# Patient Record
Sex: Male | Born: 1940 | Race: White | Hispanic: No | Smoking: Former smoker
Health system: Southern US, Community
[De-identification: ages and names within clinical notes are randomized; demographics above are authoritative.]

## PROBLEM LIST (undated history)

## (undated) DIAGNOSIS — I714 Abdominal aortic aneurysm, without rupture, unspecified: Secondary | ICD-10-CM

## (undated) DIAGNOSIS — N200 Calculus of kidney: Secondary | ICD-10-CM

## (undated) DIAGNOSIS — I7121 Aneurysm of the ascending aorta, without rupture: Secondary | ICD-10-CM

## (undated) DIAGNOSIS — Z87442 Personal history of urinary calculi: Secondary | ICD-10-CM

## (undated) DIAGNOSIS — E039 Hypothyroidism, unspecified: Secondary | ICD-10-CM

## (undated) DIAGNOSIS — I499 Cardiac arrhythmia, unspecified: Secondary | ICD-10-CM

## (undated) DIAGNOSIS — K219 Gastro-esophageal reflux disease without esophagitis: Secondary | ICD-10-CM

## (undated) DIAGNOSIS — R0600 Dyspnea, unspecified: Secondary | ICD-10-CM

## (undated) DIAGNOSIS — I219 Acute myocardial infarction, unspecified: Secondary | ICD-10-CM

## (undated) DIAGNOSIS — C801 Malignant (primary) neoplasm, unspecified: Secondary | ICD-10-CM

## (undated) DIAGNOSIS — M1990 Unspecified osteoarthritis, unspecified site: Secondary | ICD-10-CM

## (undated) DIAGNOSIS — E119 Type 2 diabetes mellitus without complications: Secondary | ICD-10-CM

## (undated) DIAGNOSIS — E785 Hyperlipidemia, unspecified: Secondary | ICD-10-CM

## (undated) DIAGNOSIS — J302 Other seasonal allergic rhinitis: Secondary | ICD-10-CM

## (undated) DIAGNOSIS — E079 Disorder of thyroid, unspecified: Secondary | ICD-10-CM

## (undated) DIAGNOSIS — I10 Essential (primary) hypertension: Secondary | ICD-10-CM

## (undated) DIAGNOSIS — I4891 Unspecified atrial fibrillation: Secondary | ICD-10-CM

## (undated) DIAGNOSIS — U071 COVID-19: Secondary | ICD-10-CM

## (undated) DIAGNOSIS — IMO0001 Reserved for inherently not codable concepts without codable children: Secondary | ICD-10-CM

## (undated) DIAGNOSIS — T148XXA Other injury of unspecified body region, initial encounter: Secondary | ICD-10-CM

## (undated) DIAGNOSIS — I2510 Atherosclerotic heart disease of native coronary artery without angina pectoris: Secondary | ICD-10-CM

## (undated) HISTORY — DX: Essential (primary) hypertension: I10

## (undated) HISTORY — PX: KIDNEY STONE SURGERY: SHX686

## (undated) HISTORY — PX: SPINE SURGERY: SHX786

## (undated) HISTORY — DX: Gastro-esophageal reflux disease without esophagitis: K21.9

## (undated) HISTORY — DX: Calculus of kidney: N20.0

## (undated) HISTORY — DX: Disorder of thyroid, unspecified: E07.9

## (undated) HISTORY — DX: Malignant (primary) neoplasm, unspecified: C80.1

## (undated) HISTORY — DX: Hyperlipidemia, unspecified: E78.5

## (undated) HISTORY — DX: Abdominal aortic aneurysm, without rupture, unspecified: I71.40

## (undated) HISTORY — DX: Type 2 diabetes mellitus without complications: E11.9

## (undated) HISTORY — DX: Other injury of unspecified body region, initial encounter: T14.8XXA

## (undated) HISTORY — DX: Acute myocardial infarction, unspecified: I21.9

## (undated) HISTORY — PX: TONSILLECTOMY: SUR1361

## (undated) HISTORY — PX: CARDIAC CATHETERIZATION: SHX172

## (undated) HISTORY — DX: Reserved for inherently not codable concepts without codable children: IMO0001

## (undated) HISTORY — DX: Atherosclerotic heart disease of native coronary artery without angina pectoris: I25.10

## (undated) HISTORY — PX: OTHER SURGICAL HISTORY: SHX169

## (undated) HISTORY — DX: Abdominal aortic aneurysm, without rupture: I71.4

## (undated) HISTORY — DX: Unspecified atrial fibrillation: I48.91

## (undated) HISTORY — DX: Unspecified osteoarthritis, unspecified site: M19.90

## (undated) HISTORY — PX: CORONARY STENT PLACEMENT: SHX1402

## (undated) HISTORY — PX: PROSTATE SURGERY: SHX751

---
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
## 1979-11-14 HISTORY — PX: OTHER SURGICAL HISTORY: SHX169

## 1991-01-12 DIAGNOSIS — I219 Acute myocardial infarction, unspecified: Secondary | ICD-10-CM

## 1991-01-12 HISTORY — DX: Acute myocardial infarction, unspecified: I21.9

## 1998-06-28 ENCOUNTER — Ambulatory Visit (HOSPITAL_COMMUNITY): Admission: RE | Admit: 1998-06-28 | Discharge: 1998-06-28 | Payer: Self-pay | Admitting: Cardiology

## 1999-09-07 ENCOUNTER — Ambulatory Visit (HOSPITAL_COMMUNITY): Admission: RE | Admit: 1999-09-07 | Discharge: 1999-09-07 | Payer: Self-pay | Admitting: *Deleted

## 1999-09-27 ENCOUNTER — Ambulatory Visit (HOSPITAL_COMMUNITY): Admission: RE | Admit: 1999-09-27 | Discharge: 1999-09-27 | Payer: Self-pay | Admitting: *Deleted

## 1999-09-27 ENCOUNTER — Encounter: Payer: Self-pay | Admitting: *Deleted

## 2002-07-28 ENCOUNTER — Encounter (INDEPENDENT_AMBULATORY_CARE_PROVIDER_SITE_OTHER): Payer: Self-pay | Admitting: *Deleted

## 2002-07-28 ENCOUNTER — Ambulatory Visit (HOSPITAL_COMMUNITY): Admission: RE | Admit: 2002-07-28 | Discharge: 2002-07-28 | Payer: Self-pay | Admitting: *Deleted

## 2003-12-03 ENCOUNTER — Encounter: Admission: RE | Admit: 2003-12-03 | Discharge: 2003-12-03 | Payer: Self-pay | Admitting: Family Medicine

## 2003-12-22 ENCOUNTER — Encounter: Admission: RE | Admit: 2003-12-22 | Discharge: 2004-01-08 | Payer: Self-pay | Admitting: Neurosurgery

## 2004-11-13 HISTORY — PX: SPINE SURGERY: SHX786

## 2005-01-17 ENCOUNTER — Inpatient Hospital Stay (HOSPITAL_COMMUNITY): Admission: RE | Admit: 2005-01-17 | Discharge: 2005-01-20 | Payer: Self-pay | Admitting: Neurosurgery

## 2005-06-12 ENCOUNTER — Encounter: Admission: RE | Admit: 2005-06-12 | Discharge: 2005-06-21 | Payer: Self-pay | Admitting: Neurosurgery

## 2005-08-21 ENCOUNTER — Ambulatory Visit (HOSPITAL_COMMUNITY): Admission: RE | Admit: 2005-08-21 | Discharge: 2005-08-22 | Payer: Self-pay | Admitting: Cardiology

## 2006-03-09 ENCOUNTER — Emergency Department (HOSPITAL_COMMUNITY): Admission: EM | Admit: 2006-03-09 | Discharge: 2006-03-09 | Payer: Self-pay | Admitting: Emergency Medicine

## 2007-10-24 ENCOUNTER — Encounter: Admission: RE | Admit: 2007-10-24 | Discharge: 2007-10-24 | Payer: Self-pay | Admitting: Cardiology

## 2008-08-14 ENCOUNTER — Ambulatory Visit: Admission: RE | Admit: 2008-08-14 | Discharge: 2008-11-03 | Payer: Self-pay | Admitting: Radiation Oncology

## 2008-10-26 ENCOUNTER — Encounter: Admission: RE | Admit: 2008-10-26 | Discharge: 2008-10-26 | Payer: Self-pay | Admitting: Urology

## 2008-11-16 ENCOUNTER — Ambulatory Visit (HOSPITAL_BASED_OUTPATIENT_CLINIC_OR_DEPARTMENT_OTHER): Admission: RE | Admit: 2008-11-16 | Discharge: 2008-11-16 | Payer: Self-pay | Admitting: Urology

## 2008-12-03 ENCOUNTER — Ambulatory Visit: Admission: RE | Admit: 2008-12-03 | Discharge: 2009-01-14 | Payer: Self-pay | Admitting: Radiation Oncology

## 2009-04-13 HISTORY — PX: CORONARY ARTERY BYPASS GRAFT: SHX141

## 2009-04-26 ENCOUNTER — Encounter: Admission: RE | Admit: 2009-04-26 | Discharge: 2009-04-26 | Payer: Self-pay | Admitting: Cardiology

## 2009-04-26 ENCOUNTER — Inpatient Hospital Stay (HOSPITAL_COMMUNITY): Admission: EM | Admit: 2009-04-26 | Discharge: 2009-05-03 | Payer: Self-pay | Admitting: General Surgery

## 2009-04-26 ENCOUNTER — Ambulatory Visit: Payer: Self-pay | Admitting: Internal Medicine

## 2009-04-28 ENCOUNTER — Encounter (INDEPENDENT_AMBULATORY_CARE_PROVIDER_SITE_OTHER): Payer: Self-pay | Admitting: Cardiology

## 2009-04-28 ENCOUNTER — Ambulatory Visit: Payer: Self-pay | Admitting: Cardiothoracic Surgery

## 2009-04-28 ENCOUNTER — Encounter: Payer: Self-pay | Admitting: Cardiothoracic Surgery

## 2009-06-02 ENCOUNTER — Ambulatory Visit: Payer: Self-pay | Admitting: Cardiothoracic Surgery

## 2009-06-02 ENCOUNTER — Encounter: Admission: RE | Admit: 2009-06-02 | Discharge: 2009-06-02 | Payer: Self-pay | Admitting: Cardiothoracic Surgery

## 2010-01-03 ENCOUNTER — Encounter: Admission: RE | Admit: 2010-01-03 | Discharge: 2010-01-03 | Payer: Self-pay | Admitting: Cardiology

## 2010-12-29 ENCOUNTER — Other Ambulatory Visit: Payer: Self-pay | Admitting: Cardiology

## 2010-12-29 DIAGNOSIS — I714 Abdominal aortic aneurysm, without rupture: Secondary | ICD-10-CM

## 2011-01-03 ENCOUNTER — Ambulatory Visit
Admission: RE | Admit: 2011-01-03 | Discharge: 2011-01-03 | Disposition: A | Discharge status: Home / Self Care | Payer: Medicare Other | Source: Ambulatory Visit | Attending: Cardiology | Admitting: Cardiology

## 2011-01-03 ENCOUNTER — Other Ambulatory Visit: Payer: Self-pay

## 2011-01-03 DIAGNOSIS — I714 Abdominal aortic aneurysm, without rupture: Secondary | ICD-10-CM

## 2011-01-16 ENCOUNTER — Encounter (INDEPENDENT_AMBULATORY_CARE_PROVIDER_SITE_OTHER): Payer: Medicare Other | Admitting: Surgery

## 2011-01-16 DIAGNOSIS — I714 Abdominal aortic aneurysm, without rupture: Secondary | ICD-10-CM

## 2011-01-17 NOTE — Assessment & Plan Note (Signed)
OFFICE VISIT  Garrett Jordan, Garrett Jordan DOB:  12/28/1940                                       01/16/2011 [id number]  REASON FOR VISIT:  Abdominal aortic aneurysm.  HISTORY:  This is a very pleasant 70-year gentleman I am seeing at the request of Dr. Wynonia Lawman for evaluation of abdominal aortic aneurysm.  The patient initially had an abdominal ultrasound last year which showed a 4.2-cm aneurysm.  This was repeated and found to be 6.3 cm.  He was sent over for evaluation.  The patient denies having any abdominal pain or back pain.  The patient suffers from hypertension and hypercholesterolemia, both of which are medically managed.  He had a heart attack in 1992 and has had multiple stents in the past and recently underwent coronary bypass graft by Dr. Prescott Gum in 2010,  Patient has a history smoking but quit in 1971.  REVIEW OF SYSTEMS:  GI:  Positive for reflux.  GU:  Positive for frequent urination.  Musculoskeletal:  Positive for arthritis, joint pain, muscle pain.  All other review systems are negative as documented in the encounter form.  PAST MEDICAL HISTORY:  Hypertension, hypercholesterolemia, history of MI/coronary artery disease, prostate cancer.  PAST SURGICAL HISTORY:  Coronary artery bypass graft x5 in 2010, radioactive prostate implant January of 2010,  back surgery at L4-L5 March 2005,  coronary stents 1992 through 2006, kidney stone removal in 1981.  SOCIAL HISTORY:  He is single.  Does not smoke, quit in 1971.  Drinks 1 to 3 glasses of wine 3 to 4 times a week.  FAMILY HISTORY:  Positive for coronary disease in his father, who died age 88 of a heart attack.  ALLERGIES:  To ACE inhibitors, Lopressor  MEDICATIONS:  Please see medical record.  PHYSICAL EXAMINATION:  Heart rate is 73, blood pressure 169/94, O2 saturation is 98%.  General:  He is well-appearing, in no distress. HEENT:  Within normal limits.  Lungs:  Clear  bilaterally. Cardiovascular:  Regular rate and rhythm.  No carotid bruits, palpable pedal pulses.  Abdomen:  Soft, nontender.  No pulsatile mass appreciated.  Musculoskeletal:  No major deformities.  Neuro:  No focal deficits.  Skin:  Without rash.  DIAGNOSTIC STUDIES:  I have reviewed the patient's ultrasound report, which shows a 6-cm aneurysm.  ASSESSMENT:  Large abdominal aortic aneurysm.  PLAN:  I question the accuracy of the most recent study since it shows a 2-cm growth in his aneurysm in sight of a year.  Regardless, I think this needs to further evaluated.  The best way to do this would be with a CT scan.  I have ordered a CT angiogram of the chest, abdomen and pelvis to be performed this coming Monday with plans for them to see me afterwards.  Today I discussed the pros and cons and the details of open versus endovascular surgery.  We will make that decision based on the details of his CAT scan.  The patient will follow up with me in 1 week.    Eldridge Abrahams, MD Electronically Signed  VWB/MEDQ  D:  01/16/2011  T:  01/17/2011  Job:  [id number]  cc:   Ezzard Standing, M.D.

## 2011-01-18 ENCOUNTER — Other Ambulatory Visit: Payer: Self-pay | Admitting: Surgery

## 2011-01-18 DIAGNOSIS — I714 Abdominal aortic aneurysm, without rupture: Secondary | ICD-10-CM

## 2011-01-19 ENCOUNTER — Other Ambulatory Visit: Payer: Self-pay | Admitting: Orthopedic Surgery

## 2011-01-19 DIAGNOSIS — M545 Low back pain: Secondary | ICD-10-CM

## 2011-01-20 ENCOUNTER — Ambulatory Visit
Admission: RE | Admit: 2011-01-20 | Discharge: 2011-01-20 | Disposition: A | Discharge status: Home / Self Care | Payer: Medicare Other | Source: Ambulatory Visit | Attending: Orthopedic Surgery | Admitting: Orthopedic Surgery

## 2011-01-20 DIAGNOSIS — M545 Low back pain: Secondary | ICD-10-CM

## 2011-01-20 MED ORDER — GADOBENATE DIMEGLUMINE 529 MG/ML IV SOLN
15.0000 mL | Freq: Once | INTRAVENOUS | Status: AC | PRN
  Administered 2011-01-20: 15 mL via INTRAVENOUS

## 2011-01-23 ENCOUNTER — Ambulatory Visit (INDEPENDENT_AMBULATORY_CARE_PROVIDER_SITE_OTHER): Payer: Medicare Other | Admitting: Surgery

## 2011-01-23 ENCOUNTER — Ambulatory Visit
Admission: RE | Admit: 2011-01-23 | Discharge: 2011-01-23 | Disposition: A | Discharge status: Home / Self Care | Payer: Medicare Other | Source: Ambulatory Visit | Attending: Surgery | Admitting: Surgery

## 2011-01-23 ENCOUNTER — Encounter: Payer: Medicare Other | Admitting: Surgery

## 2011-01-23 DIAGNOSIS — I714 Abdominal aortic aneurysm, without rupture, unspecified: Secondary | ICD-10-CM

## 2011-01-23 MED ORDER — IOHEXOL 350 MG/ML SOLN
125.0000 mL | Freq: Once | INTRAVENOUS | Status: AC | PRN
  Administered 2011-01-23: 125 mL via INTRAVENOUS

## 2011-01-24 NOTE — Assessment & Plan Note (Addendum)
OFFICE VISIT  Garrett Jordan, Garrett Jordan DOB:  [birth year]                                       01/23/2011 [id number]  The patient comes back in today for followup of his aneurysm.  Briefly, he has had 2 ultrasounds performed a year apart, one showed a 4.2 cm infrarenal aneurysm, the other most recently found a 6.3 cm aneurysm.  I was a little skeptical about the recent measurements on ultrasound and felt it needed to be further evaluated with CT scan.  He had a CT angiogram earlier today, comes in to discuss results.  By CT scan the aneurysm diameter is at its maximum extent is 4.6 cm.  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  I discussed with the patient I would still recommend waiting until his aneurysm grows to larger than 5 cm before we would proceed with repair. I will plan on seeing him back in 6 months with repeat CT scan.  I spent approximately 45 minutes discussing the results, going over his images with the patient today.    Eldridge Abrahams, MD Electronically Signed  VWB/MEDQ  D:  01/23/2011  T:  01/24/2011  Job:  [id number]  cc:   Ezzard Standing, M.D.

## 2011-02-20 LAB — CBC
HCT: 27.4 % — ABNORMAL LOW (ref 39.0–52.0)
HCT: 29.6 % — ABNORMAL LOW (ref 39.0–52.0)
HCT: 29.8 % — ABNORMAL LOW (ref 39.0–52.0)
HCT: 30.4 % — ABNORMAL LOW (ref 39.0–52.0)
HCT: 32 % — ABNORMAL LOW (ref 39.0–52.0)
HCT: 38.4 % — ABNORMAL LOW (ref 39.0–52.0)
HCT: 40.3 % (ref 39.0–52.0)
HCT: 43.3 % (ref 39.0–52.0)
Hemoglobin: 10.4 g/dL — ABNORMAL LOW (ref 13.0–17.0)
Hemoglobin: 10.4 g/dL — ABNORMAL LOW (ref 13.0–17.0)
Hemoglobin: 10.6 g/dL — ABNORMAL LOW (ref 13.0–17.0)
Hemoglobin: 11 g/dL — ABNORMAL LOW (ref 13.0–17.0)
Hemoglobin: 14.3 g/dL (ref 13.0–17.0)
Hemoglobin: 9.6 g/dL — ABNORMAL LOW (ref 13.0–17.0)
MCHC: 34.3 g/dL (ref 30.0–36.0)
MCHC: 34.5 g/dL (ref 30.0–36.0)
MCHC: 34.7 g/dL (ref 30.0–36.0)
MCHC: 35 g/dL (ref 30.0–36.0)
MCHC: 35 g/dL (ref 30.0–36.0)
MCHC: 35.1 g/dL (ref 30.0–36.0)
MCHC: 35.4 g/dL (ref 30.0–36.0)
MCV: 91 fL (ref 78.0–100.0)
MCV: 91.6 fL (ref 78.0–100.0)
MCV: 91.7 fL (ref 78.0–100.0)
MCV: 91.9 fL (ref 78.0–100.0)
MCV: 92.2 fL (ref 78.0–100.0)
MCV: 92.3 fL (ref 78.0–100.0)
MCV: 92.4 fL (ref 78.0–100.0)
MCV: 90.2 fL (ref 78.0–100.0)
Platelets: 100 10*3/uL — ABNORMAL LOW (ref 150–400)
Platelets: 101 10*3/uL — ABNORMAL LOW (ref 150–400)
Platelets: 113 10*3/uL — ABNORMAL LOW (ref 150–400)
Platelets: 116 10*3/uL — ABNORMAL LOW (ref 150–400)
Platelets: 120 10*3/uL — ABNORMAL LOW (ref 150–400)
Platelets: 141 10*3/uL — ABNORMAL LOW (ref 150–400)
Platelets: 156 10*3/uL (ref 150–400)
Platelets: 156 10*3/uL (ref 150–400)
RBC: 2.98 MIL/uL — ABNORMAL LOW (ref 4.22–5.81)
RBC: 3.23 MIL/uL — ABNORMAL LOW (ref 4.22–5.81)
RBC: 3.23 MIL/uL — ABNORMAL LOW (ref 4.22–5.81)
RBC: 3.31 MIL/uL — ABNORMAL LOW (ref 4.22–5.81)
RBC: 3.52 MIL/uL — ABNORMAL LOW (ref 4.22–5.81)
RBC: 4.16 MIL/uL — ABNORMAL LOW (ref 4.22–5.81)
RBC: 4.36 MIL/uL (ref 4.22–5.81)
RBC: 4.8 MIL/uL (ref 4.22–5.81)
RDW: 13 % (ref 11.5–15.5)
RDW: 13.1 % (ref 11.5–15.5)
RDW: 13.4 % (ref 11.5–15.5)
RDW: 13.6 % (ref 11.5–15.5)
RDW: 13.6 % (ref 11.5–15.5)
RDW: 14 % (ref 11.5–15.5)
RDW: 14.2 % (ref 11.5–15.5)
WBC: 4.7 10*3/uL (ref 4.0–10.5)
WBC: 4.7 10*3/uL (ref 4.0–10.5)
WBC: 5.4 10*3/uL (ref 4.0–10.5)
WBC: 6 10*3/uL (ref 4.0–10.5)
WBC: 6.2 10*3/uL (ref 4.0–10.5)
WBC: 7.1 10*3/uL (ref 4.0–10.5)
WBC: 3.4 10*3/uL — ABNORMAL LOW (ref 4.0–10.5)

## 2011-02-20 LAB — COMPREHENSIVE METABOLIC PANEL
ALT: 39 U/L (ref 0–53)
AST: 30 U/L (ref 0–37)
Albumin: 4.2 g/dL (ref 3.5–5.2)
CO2: 25 mEq/L (ref 19–32)
Calcium: 10.3 mg/dL (ref 8.4–10.5)
Chloride: 106 mEq/L (ref 96–112)
Creatinine, Ser: 0.98 mg/dL (ref 0.4–1.5)
GFR calc Af Amer: >60 mL/min (ref >60)
GFR calc non Af Amer: >60 mL/min (ref >60)
Sodium: 139 mEq/L (ref 135–145)

## 2011-02-20 LAB — BLOOD GAS, ARTERIAL
Acid-base deficit: 0 mmol/L (ref 0.0–2.0)
Bicarbonate: 23.6 mEq/L (ref 20.0–24.0)
FIO2: 0.21 %
O2 Saturation: 94.6 %
Patient temperature: 98.6
TCO2: 24.7 mmol/L (ref 0–100)
pCO2 arterial: 35.1 mmHg (ref 35.0–45.0)
pH, Arterial: 7.442 (ref 7.350–7.450)
pO2, Arterial: 69.3 mmHg — ABNORMAL LOW (ref 80.0–100.0)

## 2011-02-20 LAB — POCT I-STAT 3, ART BLOOD GAS (G3+)
Acid-base deficit: 2 mmol/L (ref 0.0–2.0)
Acid-base deficit: 3 mmol/L — ABNORMAL HIGH (ref 0.0–2.0)
Acid-base deficit: 3 mmol/L — ABNORMAL HIGH (ref 0.0–2.0)
Acid-base deficit: 4 mmol/L — ABNORMAL HIGH (ref 0.0–2.0)
Acid-base deficit: 4 mmol/L — ABNORMAL HIGH (ref 0.0–2.0)
Bicarbonate: 21.2 mEq/L (ref 20.0–24.0)
Bicarbonate: 22.1 mEq/L (ref 20.0–24.0)
Bicarbonate: 24.2 mEq/L — ABNORMAL HIGH (ref 20.0–24.0)
O2 Saturation: 100 %
O2 Saturation: 90 %
O2 Saturation: 94 %
O2 Saturation: 94 %
O2 Saturation: 98 %
Patient temperature: 35.7
Patient temperature: 36.6
TCO2: 22 mmol/L (ref 0–100)
TCO2: 23 mmol/L (ref 0–100)
TCO2: 24 mmol/L (ref 0–100)
TCO2: 26 mmol/L (ref 0–100)
pCO2 arterial: 37 mmHg (ref 35.0–45.0)
pCO2 arterial: 40.1 mmHg (ref 35.0–45.0)
pCO2 arterial: 48.8 mmHg — ABNORMAL HIGH (ref 35.0–45.0)
pH, Arterial: 7.304 — ABNORMAL LOW (ref 7.350–7.450)
pH, Arterial: 7.362 (ref 7.350–7.450)
pO2, Arterial: 325 mmHg — ABNORMAL HIGH (ref 80.0–100.0)
pO2, Arterial: 69 mmHg — ABNORMAL LOW (ref 80.0–100.0)

## 2011-02-20 LAB — CROSSMATCH
ABO/RH(D): O POS
Antibody Screen: NEGATIVE

## 2011-02-20 LAB — URINALYSIS, ROUTINE W REFLEX MICROSCOPIC
Bilirubin Urine: NEGATIVE
Glucose, UA: NEGATIVE mg/dL
Hgb urine dipstick: NEGATIVE
Ketones, ur: NEGATIVE mg/dL
Nitrite: NEGATIVE
Protein, ur: NEGATIVE mg/dL
Specific Gravity, Urine: 1.01 (ref 1.005–1.030)
Urobilinogen, UA: 1 mg/dL (ref 0.0–1.0)
pH: 7 (ref 5.0–8.0)

## 2011-02-20 LAB — CARDIAC PANEL(CRET KIN+CKTOT+MB+TROPI)
CK, MB: 1.2 ng/mL (ref 0.3–4.0)
CK, MB: 1.6 ng/mL (ref 0.3–4.0)
CK, MB: 1.7 ng/mL (ref 0.3–4.0)
Relative Index: 1.3 (ref 0.0–2.5)
Relative Index: 1.5 (ref 0.0–2.5)
Total CK: 101 U/L (ref 7–232)
Total CK: 107 U/L (ref 7–232)
Total CK: 127 U/L (ref 7–232)
Troponin I: 0.05 ng/mL (ref 0.00–0.06)
Troponin I: 0.07 ng/mL — ABNORMAL HIGH (ref 0.00–0.06)

## 2011-02-20 LAB — BASIC METABOLIC PANEL
BUN: 10 mg/dL (ref 6–23)
BUN: 6 mg/dL (ref 6–23)
BUN: 8 mg/dL (ref 6–23)
BUN: 9 mg/dL (ref 6–23)
CO2: 25 mEq/L (ref 19–32)
CO2: 26 mEq/L (ref 19–32)
CO2: 27 mEq/L (ref 19–32)
CO2: 28 mEq/L (ref 19–32)
CO2: 28 mEq/L (ref 19–32)
Calcium: 8 mg/dL — ABNORMAL LOW (ref 8.4–10.5)
Calcium: 8.2 mg/dL — ABNORMAL LOW (ref 8.4–10.5)
Calcium: 8.5 mg/dL (ref 8.4–10.5)
Chloride: 102 mEq/L (ref 96–112)
Chloride: 110 mEq/L (ref 96–112)
Chloride: 104 mEq/L (ref 96–112)
Creatinine, Ser: 0.84 mg/dL (ref 0.4–1.5)
Creatinine, Ser: 0.91 mg/dL (ref 0.4–1.5)
Creatinine, Ser: 1.05 mg/dL (ref 0.4–1.5)
Creatinine, Ser: 1.05 mg/dL (ref 0.4–1.5)
GFR calc Af Amer: >60 mL/min (ref >60)
GFR calc Af Amer: >60 mL/min (ref >60)
GFR calc Af Amer: >60 mL/min (ref >60)
GFR calc non Af Amer: >60 mL/min (ref >60)
GFR calc non Af Amer: >60 mL/min (ref >60)
GFR calc non Af Amer: >60 mL/min (ref >60)
Glucose, Bld: 116 mg/dL — ABNORMAL HIGH (ref 70–99)
Glucose, Bld: 130 mg/dL — ABNORMAL HIGH (ref 70–99)
Glucose, Bld: 134 mg/dL — ABNORMAL HIGH (ref 70–99)
Glucose, Bld: 142 mg/dL — ABNORMAL HIGH (ref 70–99)
Potassium: 3.6 mEq/L (ref 3.5–5.1)
Potassium: 3.7 mEq/L (ref 3.5–5.1)
Potassium: 3.9 mEq/L (ref 3.5–5.1)
Potassium: 3.6 mEq/L (ref 3.5–5.1)
Sodium: 133 mEq/L — ABNORMAL LOW (ref 135–145)
Sodium: 134 mEq/L — ABNORMAL LOW (ref 135–145)
Sodium: 139 mEq/L (ref 135–145)
Sodium: 141 mEq/L (ref 135–145)

## 2011-02-20 LAB — DIFFERENTIAL
Eosinophils Absolute: 0.1 10*3/uL (ref 0.0–0.7)
Eosinophils Relative: 2 % (ref 0–5)
Lymphocytes Relative: 30 % (ref 12–46)
Lymphs Abs: 1 10*3/uL (ref 0.7–4.0)
Monocytes Absolute: 0.4 10*3/uL (ref 0.1–1.0)

## 2011-02-20 LAB — PREPARE PLATELETS

## 2011-02-20 LAB — HEMOGLOBIN A1C
Hgb A1c MFr Bld: 5.4 % (ref 4.6–6.1)
Mean Plasma Glucose: 108 mg/dL

## 2011-02-20 LAB — GLUCOSE, CAPILLARY
Glucose-Capillary: 100 mg/dL — ABNORMAL HIGH (ref 70–99)
Glucose-Capillary: 109 mg/dL — ABNORMAL HIGH (ref 70–99)
Glucose-Capillary: 117 mg/dL — ABNORMAL HIGH (ref 70–99)
Glucose-Capillary: 119 mg/dL — ABNORMAL HIGH (ref 70–99)
Glucose-Capillary: 119 mg/dL — ABNORMAL HIGH (ref 70–99)
Glucose-Capillary: 124 mg/dL — ABNORMAL HIGH (ref 70–99)
Glucose-Capillary: 132 mg/dL — ABNORMAL HIGH (ref 70–99)
Glucose-Capillary: 138 mg/dL — ABNORMAL HIGH (ref 70–99)
Glucose-Capillary: 94 mg/dL (ref 70–99)

## 2011-02-20 LAB — POCT I-STAT 4, (NA,K, GLUC, HGB,HCT)
Glucose, Bld: 100 mg/dL — ABNORMAL HIGH (ref 70–99)
Glucose, Bld: 107 mg/dL — ABNORMAL HIGH (ref 70–99)
Glucose, Bld: 126 mg/dL — ABNORMAL HIGH (ref 70–99)
Glucose, Bld: 129 mg/dL — ABNORMAL HIGH (ref 70–99)
Glucose, Bld: 131 mg/dL — ABNORMAL HIGH (ref 70–99)
HCT: 24 % — ABNORMAL LOW (ref 39.0–52.0)
HCT: 25 % — ABNORMAL LOW (ref 39.0–52.0)
HCT: 26 % — ABNORMAL LOW (ref 39.0–52.0)
HCT: 28 % — ABNORMAL LOW (ref 39.0–52.0)
HCT: 32 % — ABNORMAL LOW (ref 39.0–52.0)
HCT: 34 % — ABNORMAL LOW (ref 39.0–52.0)
Hemoglobin: 10.9 g/dL — ABNORMAL LOW (ref 13.0–17.0)
Hemoglobin: 11.6 g/dL — ABNORMAL LOW (ref 13.0–17.0)
Hemoglobin: 7.8 g/dL — CL (ref 13.0–17.0)
Hemoglobin: 8.5 g/dL — ABNORMAL LOW (ref 13.0–17.0)
Hemoglobin: 8.8 g/dL — ABNORMAL LOW (ref 13.0–17.0)
Hemoglobin: 9.5 g/dL — ABNORMAL LOW (ref 13.0–17.0)
Potassium: 3.3 mEq/L — ABNORMAL LOW (ref 3.5–5.1)
Potassium: 3.5 mEq/L (ref 3.5–5.1)
Potassium: 3.6 mEq/L (ref 3.5–5.1)
Potassium: 3.9 mEq/L (ref 3.5–5.1)
Potassium: 4 mEq/L (ref 3.5–5.1)
Potassium: 4.7 mEq/L (ref 3.5–5.1)
Sodium: 137 mEq/L (ref 135–145)
Sodium: 138 mEq/L (ref 135–145)
Sodium: 140 mEq/L (ref 135–145)
Sodium: 140 mEq/L (ref 135–145)
Sodium: 141 mEq/L (ref 135–145)

## 2011-02-20 LAB — CREATININE, SERUM
Creatinine, Ser: 0.75 mg/dL (ref 0.4–1.5)
Creatinine, Ser: 1.01 mg/dL (ref 0.4–1.5)
GFR calc Af Amer: >60 mL/min (ref >60)
GFR calc Af Amer: >60 mL/min (ref >60)
GFR calc non Af Amer: >60 mL/min (ref >60)
GFR calc non Af Amer: >60 mL/min (ref >60)

## 2011-02-20 LAB — POCT I-STAT, CHEM 8
BUN: 6 mg/dL (ref 6–23)
BUN: 7 mg/dL (ref 6–23)
Calcium, Ion: 1.17 mmol/L (ref 1.12–1.32)
Chloride: 101 mEq/L (ref 96–112)
Creatinine, Ser: 0.8 mg/dL (ref 0.4–1.5)
HCT: 30 % — ABNORMAL LOW (ref 39.0–52.0)
Sodium: 137 mEq/L (ref 135–145)
TCO2: 21 mmol/L (ref 0–100)
TCO2: 24 mmol/L (ref 0–100)

## 2011-02-20 LAB — HEMOGLOBIN AND HEMATOCRIT, BLOOD
HCT: 25.8 % — ABNORMAL LOW (ref 39.0–52.0)
Hemoglobin: 9.1 g/dL — ABNORMAL LOW (ref 13.0–17.0)

## 2011-02-20 LAB — PROTIME-INR
INR: 1.6 — ABNORMAL HIGH (ref 0.00–1.49)
Prothrombin Time: 19.6 seconds — ABNORMAL HIGH (ref 11.6–15.2)
Prothrombin Time: 14.2 seconds (ref 11.6–15.2)

## 2011-02-20 LAB — MAGNESIUM
Magnesium: 2.3 mg/dL (ref 1.5–2.5)
Magnesium: 2.3 mg/dL (ref 1.5–2.5)
Magnesium: 2.5 mg/dL (ref 1.5–2.5)

## 2011-02-20 LAB — LIPID PANEL
Cholesterol: 132 mg/dL (ref 0–200)
LDL Cholesterol: 63 mg/dL (ref 0–99)
Total CHOL/HDL Ratio: 4.4 RATIO

## 2011-02-20 LAB — PREPARE FRESH FROZEN PLASMA

## 2011-02-20 LAB — APTT
aPTT: 34 seconds (ref 24–37)
aPTT: 93 seconds — ABNORMAL HIGH (ref 24–37)

## 2011-02-20 LAB — HEPARIN LEVEL (UNFRACTIONATED): Heparin Unfractionated: 0.65 IU/mL (ref 0.30–0.70)

## 2011-02-20 LAB — PLATELET COUNT: Platelets: 92 10*3/uL — ABNORMAL LOW (ref 150–400)

## 2011-02-20 LAB — TSH: TSH: 2.471 u[IU]/mL (ref 0.350–4.500)

## 2011-02-20 LAB — CK TOTAL AND CKMB (NOT AT ARMC)
Relative Index: 1.5 (ref 0.0–2.5)
Total CK: 155 U/L (ref 7–232)

## 2011-02-20 LAB — ABO/RH: ABO/RH(D): O POS

## 2011-02-27 LAB — URINALYSIS, ROUTINE W REFLEX MICROSCOPIC
Bilirubin Urine: NEGATIVE
Glucose, UA: NEGATIVE mg/dL
Hgb urine dipstick: NEGATIVE
Protein, ur: NEGATIVE mg/dL

## 2011-03-28 NOTE — Assessment & Plan Note (Signed)
OFFICE VISIT   Garrett Jordan, Garrett Jordan  DOB:  Oct 11, 1941                                        June 02, 2009  CHART #:  [id number]   CURRENT PROBLEMS:  1. Status post coronary artery bypass graft x5 on April 29, 2009, for      unstable angina and severe three-vessel coronary artery disease.  2. Hypertension.  3. Recent history of prostate cancer treated with radioactive seeds.   PRESENT ILLNESS:  The patient returns for his first office visit after  recent multivessel bypass grafting.  He is a very nice 70 year old  Caucasian male, who previously underwent percutaneous stents in 2006 and  returned with recurrent pain.  Progression of disease was noted on his  recent cath by Dr. Wynonia Lawman with a patent stent, but distal RCA disease as  well as a high-grade stenosis of the LAD, circumflex, and a preserved EF  of 55%.  He underwent left IMA grafting to the LAD, vein grafts to the  OM-2, RV marginal, and posterior descending, and posterolateral branch  of the right coronary.  He has done well following surgery.  He was  discharged home in sinus rhythm on the fourth postoperative day.  His  current medications are being adjusted for his blood pressure; however,  he remains on amiodarone 200 mg a day, Toprol-XL 50 mg a day, doxazosin,  Niaspan, amitriptyline, omega fish oil, and Crestor 10 mg a day.  He has  had no recurrent chest pain.  His surgical incisions have healed well.  He is anxious to increase his activity level.   PHYSICAL EXAMINATION:  VITAL SIGNS:  Blood pressure 150/90, pulse 86,  respirations 18, and saturation 98%.  GENERAL:  He is alert and comfortable.  LUNGS:  Breath sounds are clear and equal.  CHEST:  The sternum is stable and well healed.  CARDIAC:  Rhythm is regular without murmur or gallop.  EXTREMITIES:  The leg incision is well healed.  There is no peripheral  edema.   PA and lateral chest x-ray reveals clear lung fields, stable cardiac  silhouette, and the sternal wires were all intact.   PLAN:  The patient is ready to begin his rehab program.  He is already  walking 3 miles a day at home and plans to continue home rehab.  He  knows he should not lift more than 20 pounds until 3 months after  surgery, but he can resume driving and normal daily activities at this  time.  The patient will continue his current medications and he does not  need a refill on this pain medication.  I would be happy to see the  patient back for any further surgical issues.  Otherwise, he appears to  be doing very well after surgery.   Ivin Poot, M.D.  Electronically Signed   PV/MEDQ  D:  06/02/2009  T:  06/03/2009  Job:  [id number]   cc:   Ezzard Standing, M.D.  Antony Contras, M.D.

## 2011-03-28 NOTE — Cardiovascular Report (Signed)
Garrett Jordan, WIXSON NO.:  [id number]   MEDICAL RECORD NO.:  [id number]          PATIENT TYPE:  INP   LOCATION:  4707                         FACILITY:  [facility]   PHYSICIAN:  Ezzard Standing, M.D.DATE OF BIRTH:  1941-09-16   DATE OF PROCEDURE:  04/27/2009  DATE OF DISCHARGE:                            CARDIAC CATHETERIZATION   HISTORY:  A 70 year old male with known coronary artery disease with  previous stenting of the right coronary artery in multiple locations.  He presented with progressive angina.   PROCEDURE:  Left heart catheterization with coronary angiograms and left  ventriculogram.   COMMENTS ABOUT PROCEDURE:  The procedure was done in the regular cath  lab.  He was prepped and draped in the usual manner.  After Xylocaine  anesthesia a 6-French sheath was placed in the right femoral artery  percutaneously.  Angiograms were made using 6-French catheters and a 3  mL ventriculogram was performed.  The films were reviewed and he was  taken off the table and returned to the holding area in stable  condition.   HEMODYNAMIC DATA:  Aorta post contrast 146/89, LV post contrast 146/7-  12.   ANGIOGRAPHIC DATA:  Left ventriculogram:  Performed in the 30 degrees  RAO projection, aortic valve is normal, the mitral valve is normal, left  ventricle appears normal in size, estimated ejection fraction is 55%.  Wall motion was normal.  Coronary arteries arise and distribute  normally.  Calcification is noted in the proximal coronary system.  The  left main coronary artery appears normal.  Left anterior descending is  calcified proximally.  There is a severe eccentric ulcerated stenosis  estimated at 90% after the first diagonal branch of the septal  perforator the distal vessel is large.  Circumflex coronary artery has a  large first marginal branch that contained scattered irregularities.  The continuation branch of the circumflex has a 70% stenosis prior to a  second and third marginal system.  Right coronary artery is patent  proximally.  The proximal and mid vessel stents were widely patent.  There is a severe 99% stenosis between a mid vessel and distal stent.  The distal stent is patent.  A posterolateral branch has a severe  stenosis at its ostium and the distal right coronary artery is occluded  and fills by collaterals in the left coronary system.   IMPRESSION:  Three-vessel coronary artery disease with progression of  stenosis involving the left anterior descending, progression of stenosis  involving the right coronary artery in a previously unstented area and  occlusion of the distal right coronary artery since previous study.   RECOMMENDATIONS:  Consideration of bypass grafting if the bypass  surgeons think that they can tie into the distal targets.  Could be  considered for percutaneous intervention of the LAD although the right  appears to have some unfavorable characteristics for intervention.      Ezzard Standing, M.D.  Electronically Signed     WST/MEDQ  D:  04/27/2009  T:  04/27/2009  Job:  [id number]   cc:   Antony Contras, M.D.

## 2011-03-28 NOTE — Discharge Summary (Signed)
Garrett Jordan, Garrett Jordan NO.:  [id number]   MEDICAL RECORD NO.:  [id number]          PATIENT TYPE:  INP   LOCATION:  2037                         FACILITY:  [facility]   PHYSICIAN:  Ivin Poot, M.D.  DATE OF BIRTH:  19-Jan-1941   DATE OF ADMISSION:  04/26/2009  DATE OF DISCHARGE:                               DISCHARGE SUMMARY   PRIMARY ADMITTING DIAGNOSIS:  Chest pain.   ADDITIONAL/DISCHARGE DIAGNOSES:  1. Severe 3-vessel coronary artery disease.  2. Unstable angina.  3. History of coronary artery disease status post prior percutaneous      transluminal coronary angioplasty and stenting.  4. Hypertension.  5. Dyslipidemia.  6. Prostate cancer status post radioactive seed implant.  7. Postoperative atrial fibrillation.  8. Postoperative acute blood loss anemia.   PROCEDURES PERFORMED:  1. Cardiac catheterization.  2. Coronary artery bypass grafting x5 (left internal mammary artery to      the left anterior descending, saphenous vein graft to the posterior      diagonal, saphenous vein graft to the posterolateral, saphenous      vein graft to the obtuse marginal, saphenous vein graft to the      right ventricular marginal).  3. Endoscopic vein harvest, right leg and left thigh.   HISTORY:  The patient is a 70 year old male with a known history of  coronary artery disease.  He had previously undergone a PTCA and  stenting to the distal right coronary artery in 2006 by Dr. Wynonia Lawman.  He  had done well until recently when he began to have recurrent symptoms.  He had exertional chest discomfort, which increased in intensity and  frequency over the several days prior to this admission.  He ultimately  presented to the emergency department on April 26, 2009, after chest  discomfort was unrelieved with rest.  He was admitted for further  evaluation and treatment.   HOSPITAL COURSE:  Mr. Hass was subsequently ruled out for myocardial  infarction.  He was seen by Dr.  Wynonia Lawman and it was felt that he should  undergo cardiac catheterization to further delineate his coronary  anatomy.  This was performed on April 27, 2009, and he was found to have  severe 3-vessel coronary artery disease which was not felt to be  amenable to percutaneous intervention.  A cardiac surgery consult was  obtained and Dr. Prescott Gum saw the patient and reviewed his films.  He  agreed that his best course of action would be to proceed with surgical  revascularization at this time.  He explained all risks, benefits, and  alternatives of surgery to the patient and Mr. Barcroft agreed to proceed.  He remained stable and pain free on a heparin drip prior to surgery.  He  was taken to the operating room on April 29, 2009, and underwent CABG x5  as described above.  Please see previously dictated operative report for  complete details of surgery.  He tolerated the procedure well and was  transferred to the SICU in stable condition.  He was able to be  extubated shortly after surgery.  He was hemodynamically stable and  doing well on postop day #1.  His tubes and lines were removed, and he  was kept in the unit for further observation.  By postop day #2, he was  ready for transfer to the floor.  He did have a brief episode of atrial  fibrillation and was started on amiodarone.  He did convert to normal  sinus rhythm, and his amiodarone dose has been switched to a p.o. dose.  He has also had low normal blood pressures running in the 123456  systolic range.  For this reason, Dr. Mare Ferrari decreased his dose of  Cardura by half; otherwise, he has remained stable.  He presently is  maintaining sinus rhythm with rates in the 90s.  His blood pressure  remained low normal 90-one teens .  He seems to be tolerating this well.  He is maintaining O2 sats greater than 90% on room air.  His incisions  are all healing well.  He is ambulating independently in the halls and  progressing well.  Postop day #3  labs show hemoglobin 8.9, hematocrit  26, platelets 100, white count 4.0.  Sodium 134, potassium 3.9, BUN 10,  creatinine 1.05.  Chest x-ray is stable with some mild atelectasis and  effusions.  He is currently being diuresed for mild postoperative volume  overload and his potassium is being repleted.  It is felt that if he  continues to remain stable over the next 24-48 hours, he will hopefully  be ready for discharge home possibly as early as May 03, 2009.   DISCHARGE MEDICATIONS:  1. Enteric-coated aspirin 325 mg daily.  2. Cardura 4 mg nightly.  3. Lopressor 25 mg b.i.d.  4. Amiodarone 400 mg b.i.d. x1 week, then 200 mg b.i.d.  5. Oxycodone 5 mg 1-2 q.4 h. p.r.n. for pain.  6. Synthroid 75 mcg daily.  7. Amitriptyline 25 mg daily.  8. Multivitamin daily.  9. Crestor 10 mg daily.  10.Omega-3 fish oil daily.  11.Claritin 10 mg daily.  12.Niaspan 1000 mg daily.   DISCHARGE INSTRUCTIONS:  He is asked to refrain from driving, heavy  lifting, or strenuous activity.  He may continue ambulating daily and  using his incentive spirometer.  He may shower daily and clean his  incisions with soap and water.  He will continue a low-fat, low-sodium  diet.   DISCHARGE FOLLOWUP:  He will need to make an appointment to see Dr.  Wynonia Lawman in 2 weeks.  He will then follow up with Dr. Prescott Gum in 3 weeks  and our office will contact him with an appointment.  In the interim if  he experiences any problems or has questions, he is asked to contact our  office immediately.      Suzzanne Cloud, P.A.      Ivin Poot, M.D.  Electronically Signed    GC/MEDQ  D:  05/02/2009  T:  05/02/2009  Job:  [id number]   cc:   Antony Contras, M.D.  Ezzard Standing, M.D.

## 2011-03-28 NOTE — Op Note (Signed)
NAMEBRAXTEN, Garrett Jordan NO.:  [id number]   MEDICAL RECORD NO.:  [id number]          PATIENT TYPE:  INP   LOCATION:  2316                         FACILITY:  [facility]   PHYSICIAN:  Ivin Poot, M.D.  DATE OF BIRTH:  Mar 18, 1941   DATE OF PROCEDURE:  04/29/2009  DATE OF DISCHARGE:                               OPERATIVE REPORT   OPERATIONS:  1. Coronary artery bypass grafting x5 (left internal mammary artery to      left anterior descending, saphenous vein graft to obtuse marginal-      2, saphenous vein graft to right ventricular marginal branch of      right coronary, saphenous vein graft to posterior descending, and      saphenous vein graft to distal posterolateral branch of right      coronary).  2. Endoscopic harvest of bilateral leg greater saphenous vein.   SURGEON:  Ivin Poot, MD   ASSISTANT:  John Giovanni, PA-C   ANESTHESIA:  General.   ANESTHESIOLOGIST:  Crissie Sickles. Conrad [place], MD   INDICATIONS:  The patient is a 70 year old Caucasian male with  hypertension and hyperlipidemia, who had a stent placed in the distal  RCA by Dr. Wynonia Lawman in 2006.  He did well until recently when he had  recurrent symptoms.  A coronary arteriogram was performed by Dr. Wynonia Lawman  which demonstrated severe progression of disease and distal RCA beyond  the stents was a high-grade 90-95% stenosis of the posterior descending  posterolateral.  There is also proximal high-grade stenosis of the  proximal RCA.  This was severe 90%.  The LAD had a 95% stenosis and the  OM-2 branch of the left coronary had a 80% stenosis.  It is felt due to  his multivessel disease that surgical revascularization would be the  best approach.  I discussed surgical revascularization with the patient  and his family including indications, benefits, alternatives, and risks.  He understood that we would be using the internal mammary artery and  endoscopically harvested saphenous vein.  We reviewed the risks  to him  of bleeding, blood transfusion requirement, stroke, MI, infection, and  death.  After reviewing these issues, he demonstrated his understanding  and agreed to proceed with the surgery under what I felt was an informed  consent.   OPERATIVE FINDINGS:  The LAD had severe diffuse disease and the  anastomosis was placed fairly distally.  The mammary artery was delicate  and thin, but had excellent flow and measured approximately 1.4 mm.  The  saphenous vein was harvested from both legs and was of good quality.  The distal right targets were small.  The distal circumflex target was  small.  These were very difficult to consider for future redo bypass  surgery.  Overall, LV function appeared to be well preserved.  The  patient received a unit of packed cells during surgery for a hemoglobin  of less than 7 grams.   PROCEDURE:  The patient was brought to the operative room, placed supine  on the operating table where general anesthesia was induced.  The  chest,  abdomen, and legs were prepped with Betadine and draped as a sterile  field.  A sternal incision was made as the saphenous vein was harvested  endoscopically from both legs.  The internal mammary artery was  harvested as a pedicle graft from its origin at the subclavian vessels  and was a good vessel with excellent flow.  The pericardium was opened  and suspended and after the vein had been harvested and found to be  adequate, the patient was heparinized and pursestrings were placed in  the ascending aorta and right atrium.  The patient was then cannulated  and placed on cardiopulmonary bypass.  The coronaries were identified  for grafting in the mammary artery and vein grafts were prepared for the  distal anastomoses.  Cardioplegia catheters were placed both antegrade  and retrograde cold blood cardioplegia and the patient was cooled to 32  degrees.  The cross-clamp was applied and 800 mL of cold blood  cardioplegia was  delivered in split doses between the antegrade aortic  and retrograde coronary sinus catheters.  There is good cardioplegic  arrest and septal temperature dropped less than 14 degrees.  Cardioplegia was then delivered every 20 minutes or less while the cross-  clamp was in place.   The distal coronary anastomoses were then performed.  The first distal  anastomosis was to the RV marginal branch of right coronary.  There was  a high-grade 90% stenosis proximally and a reverse saphenous vein was  sewn end-to-side with running 7-0 Prolene with good flow through the  graft.  The second distal anastomosis was to the distal posterior  descending branch of the right coronary.  This was a smaller 1.2-mm  vessel with a 99% proximal stenosis and a reverse saphenous vein was  sewn end-to-side with running 7-0 Prolene with good flow through the  graft.  The third distal anastomosis was to the OM-2 branch of the  circumflex.  This was a small 1.2-mm vessel with proximal 80% stenosis  and a reverse saphenous vein was sewn end-to-side with running 7-0  Prolene with good flow through the graft.  Cardioplegia was redosed.  The fourth distal anastomosis was to the distal posterolateral branch of  the right coronary just distal to the previously placed stent.  This was  a 1.2-1.4 mm vessel with proximal 95% stenosis and a reverse saphenous  vein was sewn end-to-side with running 7-0 Prolene with good flow  through the graft.  The fifth distal anastomosis was the distal third to  LAD.  The LAD was diffusely and severely diseased, and the anastomosis  was placed beyond the most distal significant stenosis.  The left IMA  pedicle was brought through an opening created in the left lateral  pericardium, was brought down on the LAD and sewn end-to-side with  running 8-0 Prolene.  There was good flow through the anastomosis after  briefly releasing the pedicle bulldog on the mammary artery.  The  bulldog was  reapplied and the pedicle was secured to epicardium with 6-0  Prolene sutures.  Cardioplegia was redosed.   While the cross-clamp was still in place, four proximal vein anastomoses  were placed on the ascending aorta using a 4.0-mm punch running 7-0  Prolene.  Prior to tying down the final proximal anastomosis, air was  vented from the coronaries with a dose of retrograde warm blood  cardioplegia.  The cross-clamp was then removed and the heart was  cardioverted back to regular rhythm.  The  cardioplegia catheters were  removed and air was aspirated from the vein grafts with 27-gauge needle.  The proximal and distal anastomoses were checked and found to be  hemostatic.  Each graft had good flow and temporary pacing wires were  applied.  The patient was rewarmed to 37 degrees.  The lungs were re-  expanded and the ventilator was resumed.  The patient was then weaned  from bypass without difficulty on renal dose, dopamine.  Cardiac output  and blood pressure remained stable and protamine was administered  without adverse reaction.  The cannulas were removed.  The platelet  count was 90,000 and the patient was administered a platelet transfusion  with improved coagulation function.  The mediastinum was irrigated with  warm antibiotic irrigation.  The superior pericardial fat was closed  over the aorta and vein grafts.  Two mediastinal and a left pleural  chest tube were placed and brought out through separate incisions.  The  sternum was closed with interrupted steel wire.  The pectoralis fascia  was closed in running #1 Vicryl.  The subcutaneous and skin layers were  closed in running Vicryl and sterile dressings were applied.  Total  bypass time being 155 minutes.      Ivin Poot, M.D.  Electronically Signed     PV/MEDQ  D:  04/29/2009  T:  04/30/2009  Job:  [id number]   cc:   Ezzard Standing, M.D.

## 2011-03-28 NOTE — H&P (Signed)
Garrett Jordan, GROSSO NO.:  [id number]   MEDICAL RECORD NO.:  [id number]          PATIENT TYPE:  INP   LOCATION:  4707                         FACILITY:  [facility]   PHYSICIAN:  Johnney Ou, MD DATE OF BIRTH:  1941/07/13   DATE OF ADMISSION:  04/26/2009  DATE OF DISCHARGE:                              HISTORY & PHYSICAL   PRIMARY CARDIOLOGIST:  Dr. Wynonia Lawman.   PRIMARY DOCTOR:  Dr. Moreen Fowler.   CHIEF COMPLAINT:  Chest pain.   HISTORY OF PRESENT ILLNESS:  This is a 70 year old white male with a  history of coronary artery disease, status post PCI to his right  coronary artery in 2006, who presents here with her recurrence of chest  pain.  He states that he saw Dr. Wynonia Lawman in his office today describing  recurrence of chest pain that he had years ago that required stenting.  He describes the pain as epigastric in nature, though it is associated  with neck fullness.  He was given antihistamines for possible GERD  exacerbation, although today while walking between in stores he  experienced low exertional recurrence of his symptoms.  He contacted Dr.  Wynonia Lawman and was referred to the emergency department for further  evaluation and management of his symptoms.   PAST MEDICAL HISTORY:  1. Prostate cancer.  2. Coronary artery disease, status post PCI to his RCA x3, latest one      in 2006.  3. Hypertension.  4. Hyperlipidemia.   MEDICATIONS ON ADMISSION:  1. Benicar 40 mg/25 mg daily.  2. Diclofenac 75 mg as needed.  3. Synthroid 75 mcg daily.  4. Toprol XL 100 mg daily.  5. Claritin as needed.  6. Omega-3 fatty acids.  7. Diltiazem 240 mg CR daily.  8. Crestor 10 mg nightly.  9. Doxazosin 8 mg daily.  10.Niaspan 1 gram every night.  11.Amitriptyline 25 mg at night.  12.Aspirin 81 mg daily.  13.Multivitamin.   SOCIAL HISTORY:  The patient lives in [place] with his wife.  He is  currently retired.  He does not smoke.   FAMILY HISTORY:  No known early  coronary artery disease.   REVIEW OF SYSTEMS:  All 14 systems were reviewed and were negative  except as mentioned in detail in the HPI.   PHYSICAL EXAMINATION:  VITAL SIGNS:  Blood pressure is 148/96,  respiratory rate 16 times per minute, pulses 72 and regular, temperature  is 97.2.  He is saturating 98% on room air.  GENERAL:  He is a 70 year old white male appearing stated age in no  acute distress.  HEENT:  Moist mucous membranes.  Pupils are equal, round and react to  light and accommodation.  Anicteric sclerae.  NECK:  No jugulovenous distention.  CARDIOVASCULAR:  Regular rate and rhythm.  No murmurs, rubs or gallops.  LUNGS:  Clear to auscultation bilaterally.  ABDOMEN:  Nontender, nondistended.  Positive bowel sounds.  No masses.  EXTREMITIES:  No clubbing, cyanosis, edema.  NEUROLOGIC:  Alert and oriented x3.  Cranial nerves II-XII grossly  intact.  No focal neurologic deficits.  PSYCH:  Mood and affect are appropriate.  SKIN:  Warm, dry and intact.  No rashes.   RADIOLOGY:  Chest x-ray showed hyperinflated lungs consistent with COPD,  otherwise no acute process.  EKG showed normal sinus rhythm with no ST  or T-wave abnormalities.  Laboratory values are pending.   ASSESSMENT/PLAN:  This is a 70 year old white male with history of  coronary artery disease and progressive exertional angina.   1. Crescendo angina.  We will continue his current medical regimen and      plan for cardiac catheterization in the morning.  He is currently      chest pain free and is not report any rest pain.  We will cycle      cardiac enzymes and continue close monitoring and consider full-      dose heparin drip if his pain occurs at rest.  2. Hyperlipidemia.  We will check a fasting lipid profile and continue      statin medication.  3. Hypothyroidism.  We will check a TSH and continue his Synthroid.      Johnney Ou, MD  Electronically Signed     BHH/MEDQ  D:  04/26/2009  T:   04/27/2009  Job:  [id number]

## 2011-03-28 NOTE — Op Note (Signed)
Garrett Jordan, Garrett Jordan                ACCOUNT NO.:  [id number]   MEDICAL RECORD NO.:  [id number]          PATIENT TYPE:  AMB   LOCATION:  NESC                         FACILITY:  [facility]   PHYSICIAN:  Lillette Boxer. Dahlstedt, M.D.DATE OF BIRTH:  Nov 21, 1940   DATE OF PROCEDURE:  11/16/2008  DATE OF DISCHARGE:                               OPERATIVE REPORT   SURGEON:  Lillette Boxer. Dahlstedt, M.D.   RADIATION ONCOLOGIST:  Sheral Apley. Tammi Klippel, M.D.   PREOPERATIVE DIAGNOSIS:  Adenocarcinoma the prostate, clinical stage  T1C, Gleason 3+3.   POSTOPERATIVE DIAGNOSIS:  Adenocarcinoma the prostate, clinical stage  T1C, Gleason 3+3.   PRINCIPAL PROCEDURE:  I-125 brachytherapy, cystoscopy.   ANESTHESIA:  General LMA.   COMPLICATIONS:  None.   BRIEF HISTORY:  A 70 year old male with clinical stage T1C  adenocarcinoma the prostate, with a Gleason score 3+3.  He was diagnosed  with a rising PSA.  His PSA on initial presentation earlier this year  was 5.5.  He had a 43 gram gland and biopsy revealed 8/12 biopsies with  Gleason 3+3 pattern.  He is undergoing a combination radiotherapy, and  has already completed external beam therapy by Dr. Ledon Snare.  He is  here today for brachytherapy.  Risks and complications of the procedure  have been discussed with the patient.  He understands these and desires  to proceed.   DESCRIPTION OF PROCEDURE:  The patient received preoperative IV  antibiotics and was identified in the holding area.  He was subsequently  taken to the operating room where general anesthetic was administered.  He was placed in the dorsal lithotomy position.  Genitalia and perineum  were prepped and draped after a Foley catheter was placed.  His scrotum  was retracted anteriorly and a rectal tube was placed.  Dr. Tammi Klippel  placed a transrectal ultrasound probe into his rectum, and scanning of  the prostate was performed.  Outlining of the prostate and rectum as  well as urethra was  performed by Dr. Tammi Klippel.  Two fixation needles were  placed.  Once the needle template was placed against the patient's  perineum.   Following this, a total of 76 seeds were placed using 18 needles.  For  definitive information, please see the treatment planning schedule.  Following placement of all seeds by the Nucletron device, the fixation  needles in the template were removed.  Scanning using the fluoroscopy  was performed for hard picture.  The Foley catheter was removed  and cystoscopy was performed revealing no urethral or bladder injury or  seeds.  At this point the scope was removed and the procedure  terminated.  The patient was awakened and taken to PACU in stable  condition.   The patient will return to the office in 3 weeks.  He was discharged on  hydrocodone and Cipro.      Lillette Boxer. Dahlstedt, M.D.  Electronically Signed     SMD/MEDQ  D:  11/16/2008  T:  11/16/2008  Job:  [id number]   cc:   Sheral Apley Tammi Klippel, M.D.  Fax: [phone]   Antony Contras, M.D.  Fax: [phone]

## 2011-03-28 NOTE — Consult Note (Signed)
NAMEALEIX, Garrett NO.:  [id number]   MEDICAL RECORD NO.:  [id number]          PATIENT TYPE:  INP   LOCATION:  4702                         FACILITY:  [facility]   PHYSICIAN:  Ivin Poot, M.D.  DATE OF BIRTH:  12/07/1940   DATE OF CONSULTATION:  04/28/2009  DATE OF DISCHARGE:                                 CONSULTATION   CARDIOTHORACIC SURGICAL CONSULTATION:   PRIMARY CARE PHYSICIAN:  Dr. Moreen Fowler   REASON FOR CONSULTATION:  Severe three-vessel coronary disease with  unstable angina.   HISTORY OF PRESENT ILLNESS:  I was asked to evaluate this 70 year old  Caucasian male for evaluation and treatment of recently-diagnosed severe  multivessel coronary artery disease.  The patient has a history of  coronary artery stenosis and underwent PCI with stents to the distal RCA  in 2006.  He has had recent recurrent exertional chest pain which is  becoming increased in intensity and frequency for the past several days.  It is epigastric in location and radiates to his neck.  This is usually  relieved by rest and he was admitted through the emergency department 2  days ago with negative enzymes.  He underwent cardiac catheterization  yesterday by Dr. Wynonia Lawman which demonstrated progression of his disease  since 2006 with now a 95% stenosis of the LAD, 70% stenosis of the  circumflex and 99% stenosis of distal RCA beyond the previously-placed  stent.  His EF is well-preserved at 55%.  Because of his symptoms and  coronary anatomy, a surgical evaluation was requested.   PAST MEDICAL HISTORY:  1. Hypertension.  2. Dyslipidemia.  3. Status post radiation of prostate cancer with radioactive seed      placement, January 2010 by Dr. Diona Fanti.   HOME MEDICATIONS:  1. Benicar 40/25 mg daily.  2. Synthroid 75 mcg daily.  3. Toprol XL 100 mg daily.  4. Omega 3 fatty acid.  5. Cardizem CD 240 mg daily.  6. Crestor 10 mg q.h.s.  7. Doxazosin 8 mg q.h.s.  8. Amitriptyline 25  mg q.h.s.  9. Aspirin 81 mg a day.  10.Niacin 1 g daily.  11.Multivitamin.  12.Diclofenac 75 mg p.r.n.   ALLERGIES:  None.   SOCIAL HISTORY:  He is retired, lives in [place] with his wife and  does not smoke.   FAMILY HISTORY:  No known early onset of coronary disease or heart  bypass surgery.   REVIEW OF SYSTEMS:  Constitutional review is negative fever, weight  loss.  ENT review is negative for dental complaint or difficulty  swallowing.  Thoracic review is negative for history of thoracic trauma  or abnormal chest x-ray.  Cardiac review is positive for his coronary  disease.  Negative for cardiac arrhythmia, murmur or heart valve  problems.  GI review is positive for some GERD.  Negative for hepatitis,  jaundice or blood per rectum.  Urologic is positive for some nocturia  and urgency and recent history of adenocarcinoma of the prostate,  treated with radioactive seed implantation in January of this year.  Endocrine review is negative for diabetes, positive for  thyroid disease.  Vascular review is negative DVT, claudication or TIA.  His pre-CABG  Doppler showed no significant carotid stenosis and brachial artery  pressures are equal bilaterally.  Hematologic review is negative for  bleeding disorder or blood transfusion.   PHYSICAL EXAMINATION:  VITAL SIGNS:  The patient is 5 feet, weighs 90  kg.  Blood pressure is 140/90, pulse 72 and regular.  He is afebrile.  GENERAL APPEARANCE:  That of a middle-aged Caucasian male in no acute  distress.  HEENT:  Normocephalic.  NECK:  Without JVD, mass or bruit.  LYMPHATICS:  Reveal no palpable supraclavicular or cervical adenopathy.  CARDIAC:  Rhythm is regular without S3 gallop or murmur.  ABDOMINAL EXAM:  Soft, nontender, without pulsatile mass.  LUNGS:  Clear to auscultation and there is no thoracic deformity.  EXTREMITIES:  Reveal no clubbing, cyanosis, edema.  Peripheral pulses  are present in all extremities.  NEUROLOGIC  EXAM:  Alert and oriented without focal motor deficit.  SKIN:  Without rash or lesion.   LABORATORY DATA:  I reviewed the coronary arteriograms with Dr. Wynonia Lawman  and agree with his interpretation of severe multivessel coronary  disease, high-grade stenosis of the proximal LAD, but significant  disease of the distal circumflex and right coronary circulations.  He  would benefit from multivessel bypass surgery.   PLAN:  I discussed the situation with the patient and described the  surgery, indications, benefits and risks and we will proceed with  scheduling him for surgery on June 17.  Thank you for the consultation.      Ivin Poot, M.D.  Electronically Signed     PV/MEDQ  D:  04/28/2009  T:  04/28/2009  Job:  [id number]   cc:   Antony Contras, M.D.

## 2011-03-29 ENCOUNTER — Other Ambulatory Visit: Payer: Self-pay | Admitting: Surgery

## 2011-03-29 DIAGNOSIS — I714 Abdominal aortic aneurysm, without rupture: Secondary | ICD-10-CM

## 2011-03-31 NOTE — Op Note (Signed)
NAMELALIT, THELEN NO.:  [id number]   MEDICAL RECORD NO.:  [id number]          PATIENT TYPE:  OIB   LOCATION:  2899                         FACILITY:  [facility]   PHYSICIAN:  Otilio Connors, M.D.  DATE OF BIRTH:  13-Jun-1941   DATE OF PROCEDURE:  01/17/2005  DATE OF DISCHARGE:                                 OPERATIVE REPORT   PREOPERATIVE DIAGNOSIS:  Herniated nucleus pulposus with stenosis, lumbar 4-  5, with left-sided radiculopathy.   POSTOPERATIVE DIAGNOSIS:  Herniated nucleus pulposus with stenosis, lumbar 4-  5, with left-sided radiculopathy.   OPERATION PERFORMED:  Left lumbar 4-5 semi-hemilaminectomy and diskectomy,  and microdissection with the microscope.   SURGEON:  Otilio Connors, M.D.   ASSISTANT:  Marchia Meiers. Vertell Limber, M.D.   ANESTHESIA:  General endotracheal tube anesthesia.   ESTIMATED BLOOD LOSS:  Three-hundred-fifty milliliters.   FLUIDS REPLACED:  Blood given; none.   DRAINS:  None.   COMPLICATIONS:  Dural rent occurred primarily and it was sealed with Tisseel  tissue glue.   DISPOSITION:  The patient was taken to the recovery room in stable condition  after the case.   REASON FOR SURGERY:  The patient is a 70 year old gentleman who has had  severe leg pain, numbness and weakness with weakness in the EHL and  dorsiflexion, and decreased sensation at the L5 root.  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  Flexion extension views of the level showed no abnormal motion.  The patient  was brought in for a diskectomy.   DESCRIPTION OF OPERATION:  The patient was brought to the operating room and  general anesthesia was induced.  The patient was placed in the prone  position on the Wilson frame and all pressure points were padded.  The  patient was prepped and draped in a sterile fashion.  The site of incision  was injected with 10 mL of  1% lidocaine with epinephrine.   An incision was  then made in the midline over what was thought the L4-5  spinous process.  The incision was carried down in _________ fashion.  Hemostasis was obtained with Bovie electrocauterization.  The fascia was  incised and a subperiosteal dissection was done in the left side over the L4-  5 spinous process of the lamina up the facets.  Self-retaining retractor was  placed_________ interspace and x-rays were taken confirming our position in  the L4-5.   Semi-hemilaminectomy was then started with a high speed drill.  The  microscope was brought in for microdissection at this point.  Dissection was  continued with Kerrison punches.  We decompressed the L4 medial facets.  As  we were making room for this we were trying to take off the top and a dural  rent occurred.  This was sutured with a running 3-0 Prolene suture to be  closed.  After that there was no further leakage of CSF.  We then explored  the disk space.  The disk space was incised with a 15 blade and probe was  done with pituitary rongeurs.  Curets were then reached the level of the 5  root and the top of the L5 vertebral body, and we pulled out huge free  fragments of disk.  When we were finished we had a good decompression in the  5 root and the central canal, and made sure the port was decompressed.   The wound was irrigated antibiotic solution.  Hemostasis was obtained with  bipolar cauterization, Gelfoam and Thrombin.  The Gelfoam was irrigated out.  We used Tisseel tissue glue to seal the dural tear.  Again, no spinal fluid  was seen once we sutured that area. Retractors were removed.  The fascia was  closed with 0-Vicryl interrupted sutures, subcutaneous tissue was closed 0,  2-0 and 3-0 interrupted sutures, and the skin closed with Dermabond skin  glue. A dry dressing was placed.   The patient was placed back in a supine position, awakened from anesthesia  and taken to the recovery room in stable condition.      JRH/MEDQ  D:   01/17/2005  T:  01/18/2005  Job:  [id number]

## 2011-03-31 NOTE — Discharge Summary (Signed)
NAMEDANLEY, ALARCON NO.:  [id number]   MEDICAL RECORD NO.:  [id number]          PATIENT TYPE:  INP   LOCATION:  3040                         FACILITY:  [facility]   PHYSICIAN:  Otilio Connors, M.D.  DATE OF BIRTH:  1940/12/30   DATE OF ADMISSION:  01/17/2005  DATE OF DISCHARGE:  01/20/2005                                 DISCHARGE SUMMARY   DIAGNOSES:  Herniated nucleus pulposus, stenosis L4,5 with left-sided  radiculopathy.   POSTOPERATIVE DIAGNOSES:  Herniated nucleus pulposus, stenosis L4,5 with  left-sided radiculopathy.   PROCEDURE:  .  Left L4,5 __________ hemilaminectomy, diskectomy, microdissection with  microscope.   REASON FOR PROCEDURE:  The patient is a 70 year old gentleman who has had  left leg pain, numbness, and weakness found to have weakness in the HL in  dorsiflexion, decreased sensation in the L5 root.  An MRI showed a large  disk herniation of L4,5 with a free fragment.  The patient brought in for  decompression.   HOSPITAL COURSE:  The patient admitted the day of surgery and underwent  procedure above.  There was dural rent intraoperatively repaired primarily  and postop in recovery the patient flat.  He still had some weakness in the  leg, but was improved immediately postop from his preop status.  Continued  to watch him, kept him flat for 48 hours, then started to let him increase  his activity.  He had a little bit of upper left leg pain with  weightbearing, but no real pain down to the foot.  Motor strength is almost  intact which is good improvement, and his sensation has improved.  He had no  headache, no drainage from the incision, and on January 20, 2005 was  discharged home in stable condition.   DISCHARGE MEDICATIONS:  Percocet and Flexeril p.r.n., home meds.   No stress activity.  Diet as tolerated.  Followup in 3-4 weeks in my office.      JRH/MEDQ  D:  03/16/2005  T:  03/16/2005  Job:  [id number]

## 2011-03-31 NOTE — Discharge Summary (Signed)
Garrett Jordan, Garrett Jordan                ACCOUNT NO.:  [id number]   MEDICAL RECORD NO.:  [id number]          PATIENT TYPE:  OIB   LOCATION:  6525                         FACILITY:  [facility]   PHYSICIAN:  Ezzard Standing, M.D.DATE OF BIRTH:  1941/07/05   DATE OF ADMISSION:  08/21/2005  DATE OF DISCHARGE:  08/22/2005                                 DISCHARGE SUMMARY   FINAL DIAGNOSES:  1.  Progressive angina pectoris.  2.  Coronary artery disease.      1.  Distal stenosis and right coronary artery stented at this time with          a 2.25 MINI VISION stent.      2.  Patent stents involving the proximal and mid right coronary artery.  3.  Hypertension.  4.  Hyperlipidemia.  5.  Hypothyroidism, under treatment.  6.  Arthritis.   PROCEDURES:  Cardiac catheterization with stenting of the distal right  coronary artery.   HISTORY:  This 70 year old male has a previous history of inferior  infarction and coronary artery disease.  His last cardiac procedure was 8  years ago, at which time he had stenting of the mid and proximal right  coronary artery.  He had done well, but recently had noticed progressive  angina pectoris and was brought directly to the cath lab.  Please see the  previously dictated history and physical for remainder of the details.   HOSPITAL COURSE:  His laboratory work was done as an outpatient and showed  normal renal function.  His lipid panel had most recently shown an HDL  cholesterol of 24, cholesterol total was 137, LDL was 71, triglycerides were  214.  CBC was normal.  Glucose was 123, which was modestly elevated.  This  will need to be followed up as an outpatient.  PT and PTT were normal.  EKG  showed a previous inferior infarction.   The patient was brought for a same-day cardiac catheterization.  He was  found to have a severe stenosis involving the distal right coronary artery  with patent stents proximally.  The circumflex also had a distal stenosis  prior  to a second and third marginal branch, but these were somewhat small.  It was elected to stent the right coronary artery, which was stented with a  2.25 x 23-mm MINI VISION stent.  Please see the details of the procedure  note, as this was a fairly difficult procedure to accomplish.  He tolerated  this well and had negative enzymes, EKG and no chest pain following the  procedure.   DISCHARGE MEDICATIONS:  He is discharged at this time on:  1.  Amitriptyline 25 mg daily.  2.  Aspirin 325 mg daily.  3.  Cardura 8 mg daily.  4.  Claritin 10 mg daily.  5.  Crestor 10 mg daily.  6.  Lotensin/HCTZ 20/25 mg daily.  7.  Synthroid 0.175 mg daily.  8.  Toprol-XL 25 mg daily.  9.  Fish oil twice a day.  10. Plavix 75 mg daily.  11. Niaspan 500 mg at bedtime.  12.  Glucosamine 500 mg daily.   FOLLOWUP:  He is to follow up in 1 week.  It is recommended that he have a  hemoglobin A1c as an outpatient and that strict attention be given toward  management of his glucose.  He was begun on Niaspan for treatment of low HDL  cholesterol and should continue on Crestor.      Ezzard Standing, M.D.  Electronically Signed     WST/MEDQ  D:  08/22/2005  T:  08/22/2005  Job:  [id number]   cc:   Antony Contras, M.D.  Fax: [phone]

## 2011-03-31 NOTE — Cardiovascular Report (Signed)
NAMERONE, LAVELL NO.:  [id number]   MEDICAL RECORD NO.:  [id number]          PATIENT TYPE:  OIB   LOCATION:  6525                         FACILITY:  [facility]   PHYSICIAN:  Ezzard Standing, M.D.DATE OF BIRTH:  [birth year]   DATE OF PROCEDURE:  08/21/2005  DATE OF DISCHARGE:                              CARDIAC CATHETERIZATION   HISTORY:  The patient is a 70 year old male with previous percutaneous  interventions of the LAD as well as the right coronary artery.  He presents  with increasing angina.  The last intervention was eight years ago.   PROCEDURE:  The patient was brought to the catheterization laboratory and  was prepped and draped in the usual manner.  After Xylocaine anesthesia a 6-  French sheath was placed in the right femoral artery percutaneously.  Angiograms were made using 6-French catheters and a 30 mL ventriculogram was  performed.  He was found to have disease in the distal circumflex as well as  the distal right coronary artery.  The right coronary is felt to be the  culprit lesion.  Arrangements made for stenting.  Angiomax was begun and IV  nitroglycerin was begun.  Initially, a 6-French AL1 catheter was tried with  would not cannulate the artery.  This was changed for a 6-French ATR1 that  cannulated the artery easily.  An HTF-J guidewire would not cross the lesion  initially and a Luge guidewire was positioned in the distal vessel.  The  lesion was pre dilated with a 2 x 12 mm Voyager when a 2 x 12 mm Ranger  would not go down.  The ACT was 343.  Nitroglycerin was given intracoronary.  Eventually changed to an Amplatz 0.75 6-French guide and this did not give  adequate back-up to allow a stent to be placed down the artery and this  system all came out and the wire and the balloon came out of the vessel.  I  then made a decision to upgrade to a 7-French system.  Everything was  removed and a 7-French sheath was inserted.  A 7-French Amplatz  0.75 mm  guide was then inserted and the lesion was rewired with an HTF-J guidewire.  The previous a Luge wire was used as a buddy wire and again the stent would  not go down.  The distal lesion was dilated with a 2.25 x 20 mm Voyager  balloon and I then attempted again with the use of the buddy wire and was  able to pass a 23 x 2.25 mini Vision stent that was dilated to 12  atmospheres.  This had an excellent angiographic result.  Following this  post dilatation angiograms were obtained and the sheath was sutured in  place.   HEMODYNAMIC DATA:  Aorta post contrast 132/82.  LV post contrast 132/10-14.   ANGIOGRAPHIC DATA:  Left ventriculogram:  Performed in the 30 degree RAO  projection.  The aortic valve is normal.  The mitral valve is normal.  The  left ventricle appears normal in size.  The estimated ejection fraction is  60%.  There is  minimal inferior wall hypokinesis noted.  Coronary arteries  arise and distribute normally.  Calcification is seen involving the right  coronary artery as well as the left coronary artery.  The left main coronary  artery is normal.  Left anterior descending is moderately calcified with 30-  40% proximal narrowing.  The vessel is moderately tortuous, but appears  widely patent and with no severe focal obstructive stenoses noted.   Circumflex coronary artery:  Large intermediate branch.  Has a mild 30%  proximal narrowing noted.  The circumflex continuation branch is diffusely  diseased and has an 80-90% stenosis prior to a distal bifurcating second and  third marginal branch.   Right coronary artery:  The previous stents are widely patent.  There is an  area of proximal 40-50% narrowing prior to the initial stent.  There is an  area of 40-50% narrowing between the proximal and the mid stent.  The mid  stent is widely patent.  Following this in the distal vessel prior to the  arch of the posterior descending artery is a long area of severe disease at   15 mm with tandem lesions.  There is collateral filling seen from left  coronary system.  Post dilatation angiograms of the right coronary artery  reveals 0% residual stenosis with no dissection.  There is narrowing of the  posterior descending artery at this juncture but it was opted to leave this  alone.   IMPRESSION:  1.  Successful stenting of the distal right coronary artery with a 2.25 mini      Vision stent dilated to 12 atmospheres equals 2.4 mm.  2.  Long-term patency of proximal and mid stents involving the right      coronary artery.  3.  Residual disease involving the distal circumflex system with mild to      moderate disease involving the left anterior descending.  4.  Mild inferior wall hypokinesis.   RECOMMENDATIONS:  The patient will be placed on Plavix and aspirin.  He will  be allowed to recover and go home.  We will consider whether to intervene on  the distal vessel in the right coronary artery but it is small and diffusely  diseased and may just be best treated medically.  In the future the patient  should have a 7-French system used for interventions on the right coronary  artery.  An AL 0.75 guide works best.  The buddy wire system also worked  best for assessing the distal stenosis.      Ezzard Standing, M.D.  Electronically Signed     WST/MEDQ  D:  08/21/2005  T:  08/21/2005  Job:  [id number]   cc:   Antony Contras, M.D.  Fax: [phone]

## 2011-04-13 ENCOUNTER — Other Ambulatory Visit: Payer: Self-pay | Admitting: Orthopedic Surgery

## 2011-04-13 ENCOUNTER — Encounter (HOSPITAL_COMMUNITY): Payer: Medicare Other

## 2011-04-13 LAB — CBC
HCT: 42.4 % (ref 39.0–52.0)
MCH: 30.7 pg (ref 26.0–34.0)
MCV: 86.9 fL (ref 78.0–100.0)
RBC: 4.88 MIL/uL (ref 4.22–5.81)
RDW: 13.3 % (ref 11.5–15.5)
WBC: 5.1 10*3/uL (ref 4.0–10.5)

## 2011-04-13 LAB — COMPREHENSIVE METABOLIC PANEL
ALT: 33 U/L (ref 0–53)
AST: 22 U/L (ref 0–37)
Albumin: 4.3 g/dL (ref 3.5–5.2)
Alkaline Phosphatase: 98 U/L (ref 39–117)
Potassium: 4 mEq/L (ref 3.5–5.1)
Sodium: 140 mEq/L (ref 135–145)
Total Protein: 7.3 g/dL (ref 6.0–8.3)

## 2011-04-13 LAB — PROTIME-INR: INR: 1.14 (ref 0.00–1.49)

## 2011-04-13 LAB — URINALYSIS, ROUTINE W REFLEX MICROSCOPIC
Bilirubin Urine: NEGATIVE
Hgb urine dipstick: NEGATIVE
Ketones, ur: NEGATIVE mg/dL
Specific Gravity, Urine: 1.014 (ref 1.005–1.030)
pH: 7 (ref 5.0–8.0)

## 2011-04-13 LAB — APTT: aPTT: 29 seconds (ref 24–37)

## 2011-04-15 ENCOUNTER — Ambulatory Visit (HOSPITAL_COMMUNITY)
Admission: RE | Admit: 2011-04-15 | Discharge: 2011-04-15 | Disposition: A | Discharge status: Home / Self Care | Payer: Medicare Other | Source: Ambulatory Visit | Attending: Orthopedic Surgery | Admitting: Orthopedic Surgery

## 2011-04-15 DIAGNOSIS — Z951 Presence of aortocoronary bypass graft: Secondary | ICD-10-CM | POA: Insufficient documentation

## 2011-04-15 DIAGNOSIS — Z79899 Other long term (current) drug therapy: Secondary | ICD-10-CM | POA: Insufficient documentation

## 2011-04-15 DIAGNOSIS — I2510 Atherosclerotic heart disease of native coronary artery without angina pectoris: Secondary | ICD-10-CM | POA: Insufficient documentation

## 2011-04-15 DIAGNOSIS — I10 Essential (primary) hypertension: Secondary | ICD-10-CM | POA: Insufficient documentation

## 2011-04-15 DIAGNOSIS — G5730 Lesion of lateral popliteal nerve, unspecified lower limb: Secondary | ICD-10-CM | POA: Insufficient documentation

## 2011-04-15 DIAGNOSIS — E039 Hypothyroidism, unspecified: Secondary | ICD-10-CM | POA: Insufficient documentation

## 2011-04-19 NOTE — Op Note (Signed)
NAMEJSAON, Garrett Jordan                ACCOUNT NO.:  [id number]  MEDICAL RECORD NO.:  [id number]           PATIENT TYPE:  O  LOCATION:  DAYL                         FACILITY:  [facility]  PHYSICIAN:  Gaynelle Arabian, M.D.    DATE OF BIRTH:  [birth year]  DATE OF PROCEDURE:  04/15/2011 DATE OF DISCHARGE:                              OPERATIVE REPORT   PREOPERATIVE DIAGNOSIS:  Right peroneal nerve palsy.  POSTOPERATIVE DIAGNOSIS:  Right peroneal nerve palsy.  PROCEDURE:  Right peroneal nerve decompression.  SURGEON:  Gaynelle Arabian, MD  ASSISTANT.:  None.  ANESTHESIA:  General.  ESTIMATED BLOOD LOSS:  Minimal.  DRAINS:  None.  TOURNIQUET TIME:  12 minutes, 300 mmHg.  COMPLICATIONS:  None.  CONDITION.:  Stable to Recovery.  BRIEF CLINICAL NOTE:  Garrett Jordan is a 70 year old male with a complex presentation of a right footdrop.  He says for couple of months he noticed weakness in the right foot and then presented with peroneal palsy.  He had a lumbar evaluation, which showed no lumbar reason for footdrop.  He then had a nerve conduction study showing evidence of compression near fibular neck.  MRI scan of the knee showed Baker cyst, lateral meniscal tear, but no area where the nerve was being compressed. Given his worsening footdrop and weakness, we decided to do a nerve exploration and decompression.  He presents now for that operation.  PROCEDURE IN DETAIL:  After successful administration of general anesthetic, the patient was placed in the left lateral decubitus position with the right side up and held with the hip positioner.  A tourniquet was placed high on his right thigh and his right lower extremity prepped and draped in the usual sterile fashion.  The leg was elevated and tourniquet inflated to 300 mmHg.  Incision was made starting just posterior to the biceps tendon and coursing straight line to the fibular head.  Skin was cut with a 10 blade to the subcutaneous tissue.   The posterior border of the IT band and the tendon were identified and I made a small incision in the fascia there.  Carefully dissected to find the nerve.  Once I found the nerve, I carefully decompressed the tissue superior to it until the nerve was  well freed up under the tendon.  Distally, we carefully dissected down to the fibular head.  The nerve had a healthy appearance, but as it was coursing around the fibular neck, it was more flattened.  There was a band overlying the nerve as it entered the anterior muscular compartment.  I carefully dissected through this band and then followed the nerve down into the muscle.  The nerve was definitely freed up. There were no other bands noted and no other areas of constriction.  We then thoroughly irrigated the wound with saline.  The tourniquet was then released.  I spent several minutes achieving meticulous hemostasis. When I found that it was completely dry, I irrigated again and then we closed the subcu with interrupted 2-0 Vicryl and then subcuticular running 4-0 Monocryl.  Incisions cleaned and dried and Steri-Strips and a bulky sterile dressing applied.  He was placed into a knee immobilizer, awakened and transported to Recovery in stable condition.     Gaynelle Arabian, M.D.     FA/MEDQ  D:  04/15/2011  T:  04/15/2011  Job:  [id number]  Electronically Signed by Gaynelle Arabian M.D. on 04/19/2011 06:00:21 PM

## 2011-07-03 ENCOUNTER — Encounter: Payer: Self-pay | Admitting: Surgery

## 2011-07-28 ENCOUNTER — Other Ambulatory Visit: Payer: Self-pay | Admitting: Surgery

## 2011-07-28 LAB — BUN: BUN: 17 mg/dL (ref 6–23)

## 2011-07-28 LAB — CREATININE, SERUM: Creat: 1.09 mg/dL (ref 0.50–1.35)

## 2011-07-31 ENCOUNTER — Ambulatory Visit: Payer: Medicare Other | Admitting: Surgery

## 2011-07-31 ENCOUNTER — Inpatient Hospital Stay
Admission: RE | Admit: 2011-07-31 | Discharge: 2011-07-31 | Payer: Medicare Other | Source: Ambulatory Visit | Attending: Surgery | Admitting: Surgery

## 2011-08-18 ENCOUNTER — Encounter: Payer: Self-pay | Admitting: Surgery

## 2011-08-18 LAB — CBC
HCT: 41.4 % (ref 39.0–52.0)
MCV: 90.3 fL (ref 78.0–100.0)
Platelets: 176 10*3/uL (ref 150–400)
RDW: 13.2 % (ref 11.5–15.5)

## 2011-08-18 LAB — COMPREHENSIVE METABOLIC PANEL
Albumin: 3.7 g/dL (ref 3.5–5.2)
BUN: 12 mg/dL (ref 6–23)
Chloride: 102 mEq/L (ref 96–112)
Creatinine, Ser: 0.94 mg/dL (ref 0.4–1.5)
Total Bilirubin: 0.8 mg/dL (ref 0.3–1.2)
Total Protein: 6.4 g/dL (ref 6.0–8.3)

## 2011-08-18 LAB — PROTIME-INR: INR: 1.1 (ref 0.00–1.49)

## 2011-08-18 LAB — APTT: aPTT: 28 seconds (ref 24–37)

## 2011-08-21 ENCOUNTER — Encounter: Payer: Self-pay | Admitting: Surgery

## 2011-08-21 ENCOUNTER — Ambulatory Visit (INDEPENDENT_AMBULATORY_CARE_PROVIDER_SITE_OTHER): Payer: Medicare Other | Admitting: Surgery

## 2011-08-21 ENCOUNTER — Ambulatory Visit
Admission: RE | Admit: 2011-08-21 | Discharge: 2011-08-21 | Disposition: A | Discharge status: Home / Self Care | Payer: Medicare Other | Source: Ambulatory Visit | Attending: Surgery | Admitting: Surgery

## 2011-08-21 ENCOUNTER — Ambulatory Visit: Payer: Medicare Other | Admitting: Surgery

## 2011-08-21 VITALS — BP 149/80 | HR 58 | Resp 16 | Ht 70.0 in | Wt 215.0 lb

## 2011-08-21 DIAGNOSIS — I714 Abdominal aortic aneurysm, without rupture, unspecified: Secondary | ICD-10-CM

## 2011-08-21 DIAGNOSIS — Z01818 Encounter for other preprocedural examination: Secondary | ICD-10-CM

## 2011-08-21 MED ORDER — IOHEXOL 350 MG/ML SOLN
100.0000 mL | Freq: Once | INTRAVENOUS | Status: AC | PRN
  Administered 2011-08-21: 100 mL via INTRAVENOUS

## 2011-08-21 NOTE — Progress Notes (Signed)
Addended by: Mena Goes on: 08/21/2011 02:49 PM   Modules accepted: Orders

## 2011-08-21 NOTE — Progress Notes (Signed)
[facility] [place]   Patient name: Garrett Jordan MRN: [id number] DOB: [birth year] Sex: male     Chief Complaint  Patient presents with  . AAA    6 mo f/u; CTA today    HISTORY OF PRESENT ILLNESS: The patient comes back today for followup of his abdominal aortic aneurysm. In the past he's had some discrepancies on his ultrasound findings this was clarified with a CT angiogram in March of 2012 which showed his aneurysm to be at maximum diameter of 4.6 cm. 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 He is back today for followup the only change in his medical history since last saw him is he has undergone nerve decompression in the right foot. He denies having any back pain or abdominal pain he denies symptoms of claudication  Past Medical History  Diagnosis Date  . AAA (abdominal aortic aneurysm)   . CAD (coronary artery disease)   . Hypertension   . Atrial fibrillation   . Myocardial infarction     sp inferior  . Hyperlipidemia   . Cancer     prostate  . Thyroid disease     hypothyroidism  . Arthritis   . Reflux   . Gout   . Nerve compression     right leg    Past Surgical History  Procedure Date  . Coronary artery bypass graft   . Parathyroid adenoma   . Spine surgery   . Kidney stone surgery   . Tonsillectomy   . Prostate surgery     rad seeds  . Coronary stent placement 2009    RCA    History   Social History  . Marital Status: Divorced    Spouse Name: N/A    Number of Children: N/A  . Years of Education: N/A   Occupational History  . Not on file.   Social History Main Topics  . Smoking status: Former Smoker    Types: Cigarettes    Quit date: 08/01/1970  . Smokeless tobacco: Never Used  . Alcohol Use: Yes     1-3 glasses 3-4 times a week  . Drug Use:   . Sexually Active:    Other Topics Concern  . Not on file   Social History Narrative  . No narrative on file    Family History  Problem Relation Age of  Onset  . Heart disease Father   . Heart attack Father   . Stroke Mother   . Heart disease Sister   . Heart disease Paternal Uncle   . Diabetes Maternal Grandmother     Allergies as of 08/21/2011 - Review Complete 08/21/2011  Allergen Reaction Noted  . Ace inhibitors Cough 07/03/2011  . Lopressor (metoprolol tartrate) Cough 07/03/2011  . Nitroglycerin Other (See Comments) 08/21/2011    Current Outpatient Prescriptions on File Prior to Visit  Medication Sig Dispense Refill  . aspirin 81 MG tablet Take 81 mg by mouth daily.        . diclofenac (VOLTAREN) 75 MG EC tablet Take 75 mg by mouth daily.        Marland Kitchen diltiazem (CARDIZEM CD) 240 MG 24 hr capsule Take 240 mg by mouth daily.        . famotidine (PEPCID) 20 MG tablet Take 20 mg by mouth 2 (two) times daily.        . fish oil-omega-3 fatty acids 1000 MG capsule Take 2 g by mouth daily.        Marland Kitchen  levothyroxine (SYNTHROID, LEVOTHROID) 150 MCG tablet Take 150 mcg by mouth daily.        Marland Kitchen loratadine-pseudoephedrine (CLARITIN-D 24-HOUR) 10-240 MG per 24 hr tablet Take 1 tablet by mouth daily.        . metoprolol (TOPROL-XL) 100 MG 24 hr tablet Take 100 mg by mouth daily.        . Multiple Vitamin (MULTIVITAMIN PO) Take 1 tablet by mouth daily.        . niacin (NIASPAN) 1000 MG CR tablet Take 1,000 mg by mouth at bedtime.        Marland Kitchen olmesartan-hydrochlorothiazide (BENICAR HCT) 40-25 MG per tablet Take 1 tablet by mouth daily.        . rosuvastatin (CRESTOR) 10 MG tablet Take 10 mg by mouth daily.        Marland Kitchen amLODipine (NORVASC) 5 MG tablet Take 5 mg by mouth daily.        . Tamsulosin HCl (FLOMAX) 0.4 MG CAPS Take 0.4 mg by mouth. One q other day        Current Facility-Administered Medications on File Prior to Visit  Medication Dose Route Frequency Provider Last Rate Last Dose  . iohexol (OMNIPAQUE) 350 MG/ML injection 100 mL  100 mL Intravenous Once PRN Medication Radiologist   100 mL at 08/21/11 1101     REVIEW OF SYSTEMS: No change  except what is mentioned in the history of present illness  PHYSICAL EXAMINATION: General: The patient appears their stated age.  Vital signs are BP 149/80  Pulse 58  Resp 16  Ht 5\' 10"  (1.778 m)  Wt 215 lb (97.523 kg)  BMI 30.85 kg/m2 Pulmonary: There is a good air exchange bilaterally without wheezing or rales. Abdomen: Soft and non-tender Aorta is not palpable Musculoskeletal: There are no major deformities. The right foot and leg are in a brace following his nerve decompression Neurologic: No focal weakness or paresthesias are detected, Skin: There are no ulcer or rashes noted. Psychiatric: The patient has normal affect. Cardiovascular: There is a regular rate and rhythm without significant murmur appreciated. 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 L. plan on him back in 6 months with a repeat CT and exam. Most likely at that time he will he clips a 5 cm threshold I will consider elective operative repair for that reason I meant to go ahead and order a carotid duplex as well as lower sternum A. ankle-brachial indices as a preop screening. Again he'll come back to see me in 6 months  V. Leia Alf, M.D. [facility] [place] Office: [phone]

## 2012-02-15 ENCOUNTER — Other Ambulatory Visit: Payer: Self-pay | Admitting: *Deleted

## 2012-02-15 ENCOUNTER — Other Ambulatory Visit: Payer: Self-pay | Admitting: Surgery

## 2012-02-15 DIAGNOSIS — I714 Abdominal aortic aneurysm, without rupture: Secondary | ICD-10-CM

## 2012-02-16 ENCOUNTER — Encounter: Payer: Self-pay | Admitting: Surgery

## 2012-02-16 LAB — CREATININE, SERUM: Creat: 1.02 mg/dL (ref 0.50–1.35)

## 2012-02-19 ENCOUNTER — Other Ambulatory Visit: Payer: Medicare Other

## 2012-02-19 ENCOUNTER — Ambulatory Visit
Admission: RE | Admit: 2012-02-19 | Discharge: 2012-02-19 | Disposition: A | Discharge status: Home / Self Care | Payer: Medicare Other | Source: Ambulatory Visit | Attending: Surgery | Admitting: Surgery

## 2012-02-19 ENCOUNTER — Other Ambulatory Visit (INDEPENDENT_AMBULATORY_CARE_PROVIDER_SITE_OTHER): Payer: Medicare Other | Admitting: *Deleted

## 2012-02-19 ENCOUNTER — Encounter (INDEPENDENT_AMBULATORY_CARE_PROVIDER_SITE_OTHER): Payer: Medicare Other | Admitting: *Deleted

## 2012-02-19 ENCOUNTER — Encounter: Payer: Self-pay | Admitting: Surgery

## 2012-02-19 ENCOUNTER — Ambulatory Visit (INDEPENDENT_AMBULATORY_CARE_PROVIDER_SITE_OTHER): Payer: Medicare Other | Admitting: Surgery

## 2012-02-19 VITALS — BP 158/87 | HR 68 | Temp 98.0°F | Ht 70.0 in | Wt 211.6 lb

## 2012-02-19 DIAGNOSIS — Z01810 Encounter for preprocedural cardiovascular examination: Secondary | ICD-10-CM

## 2012-02-19 DIAGNOSIS — I714 Abdominal aortic aneurysm, without rupture: Secondary | ICD-10-CM

## 2012-02-19 DIAGNOSIS — Z01818 Encounter for other preprocedural examination: Secondary | ICD-10-CM

## 2012-02-19 MED ORDER — IOHEXOL 350 MG/ML SOLN
100.0000 mL | Freq: Once | INTRAVENOUS | Status: AC | PRN
  Administered 2012-02-19: 100 mL via INTRAVENOUS

## 2012-02-19 NOTE — Progress Notes (Signed)
[facility] [place]   Patient name: Garrett BARBOZA Sr. MRN: [id number] DOB: October 31, 1941 Sex: male     Chief Complaint  Patient presents with  . 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 He continues to be without abdominal pain. He denies neurologic symptoms. He denies claudication. He has had some ringing in his ear since December. He has a history of coronary artery disease. This has been stable.  Past Medical History  Diagnosis Date  . AAA (abdominal aortic aneurysm)   . CAD (coronary artery disease)   . Hypertension   . Atrial fibrillation   . Myocardial infarction     sp inferior  . Hyperlipidemia   . Cancer     prostate  . Thyroid disease     hypothyroidism  . Arthritis   . Reflux   . Gout   . Nerve compression     right leg    Past Surgical History  Procedure Date  . Coronary artery bypass graft   . Parathyroid adenoma   . Spine surgery   . Kidney stone surgery   . Tonsillectomy   . Prostate surgery     rad seeds  . Coronary stent placement 2009    RCA    History   Social History  . Marital Status: Divorced    Spouse Name: N/A    Number of Children: N/A  . Years of Education: N/A   Occupational History  . Not on file.   Social History Main Topics  . Smoking status: Former Smoker    Types: Cigarettes    Quit date: 08/01/1970  . Smokeless tobacco: Never Used  . Alcohol Use: Yes     1-3 glasses 3-4 times a week  . Drug Use:   . Sexually Active:    Other Topics Concern  . Not on file   Social History Narrative  . No narrative on file    Family History  Problem Relation Age of Onset  . Heart disease Father   . Heart attack Father   . Stroke Mother   . Heart disease Sister   . Heart disease Paternal Uncle   . Diabetes Maternal Grandmother     Allergies as of 02/19/2012 - Review Complete  02/19/2012  Allergen Reaction Noted  . Ace inhibitors Cough 07/03/2011  . Lopressor (metoprolol tartrate) Cough 07/03/2011  . Nitroglycerin Other (See Comments) 08/21/2011    Current Outpatient Prescriptions on File Prior to Visit  Medication Sig Dispense Refill  . allopurinol (ZYLOPRIM) 300 MG tablet Take 300 mg by mouth at bedtime.        Marland Kitchen amLODipine (NORVASC) 5 MG tablet Take 5 mg by mouth daily.        Marland Kitchen aspirin 81 MG tablet Take 81 mg by mouth daily.        . clonazePAM (KLONOPIN) 1 MG tablet Take 1 mg by mouth at bedtime as needed.        . diclofenac (VOLTAREN) 75 MG EC tablet Take 75 mg by mouth daily.        Marland Kitchen diltiazem (CARDIZEM CD) 240 MG 24 hr capsule Take 240 mg by mouth daily.        . famotidine (PEPCID) 20 MG tablet Take 20 mg by mouth at bedtime as needed.       . fish oil-omega-3 fatty acids  1000 MG capsule Take 1,200 mg by mouth daily.       Marland Kitchen levothyroxine (SYNTHROID, LEVOTHROID) 150 MCG tablet Take 150 mcg by mouth daily.        Marland Kitchen loratadine-pseudoephedrine (CLARITIN-D 24-HOUR) 10-240 MG per 24 hr tablet Take 1 tablet by mouth daily.        . metoprolol (TOPROL-XL) 100 MG 24 hr tablet Take 100 mg by mouth daily.        . Multiple Vitamin (MULTIVITAMIN PO) Take 1 tablet by mouth daily.        . niacin (NIASPAN) 1000 MG CR tablet Take 1,000 mg by mouth at bedtime.        Marland Kitchen olmesartan-hydrochlorothiazide (BENICAR HCT) 40-25 MG per tablet Take 1 tablet by mouth daily.        . rosuvastatin (CRESTOR) 10 MG tablet Take 10 mg by mouth daily.        . Tamsulosin HCl (FLOMAX) 0.4 MG CAPS Take 0.4 mg by mouth. One q other day        No current facility-administered medications on file prior to visit.     REVIEW OF SYSTEMS: Per the patient, there have been no changes since the last visit with the exception of ringing in his ears  PHYSICAL EXAMINATION:   Vital signs are BP 158/87  Pulse 68  Temp(Src) 98 F (36.7 C) (Oral)  Ht 5\' 10"  (1.778 m)  Wt 211 lb 9.6 oz (95.981  kg)  BMI 30.36 kg/m2 General: The patient appears their stated age. HEENT:  No gross abnormalities Pulmonary:  Non labored breathing Abdomen: Soft and non-tender Musculoskeletal: There are no major deformities. Neurologic: No focal weakness or paresthesias are detected, Skin: There are no ulcer or rashes noted. Psychiatric: The patient has normal affect. Cardiovascular: There is a regular rate and rhythm without significant murmur appreciated.   Diagnostic Studies CT angiogram: Interval increase in the size of his aneurysm, maximum diameter is now 5.2 cm. The right hypogastric artery aneurysm has a maximum diameter of 2.4 cm.  Carotid duplex: Minimal plaquing and disease bilaterally  ABI: 1.1 on the right 1.1 the left, both with triphasic waveforms  Assessment: Abdominal aortic aneurysm, right hypogastric aneurysm Plan: Based on the size increase since his last visit I have recommended proceeding with repair. I believe this can be done percutaneously, however with his multiple catheterizations and reports of scar tissue in his right groin, he may require a cutdown of the right groin. We discussed addressing his right hypogastric aneurysm at this time. I feel this will be best managed in 2 stages. On Wednesday, April 17 I will proceed with left groin access and cool embolization of the outflow tract of his right hypogastric artery. I will give him 6 weeks to recover from this we discussed the risks and benefits of the operation including the risk of cardiopulmonary complications, the risk of stroke and death, the risk of gastrointestinal and lower extremity embolic complications. All of his questions were answered today. I have scheduled his nominal aneurysm repair for may 15.and then proceed with endovascular aneurysm repair with extension of the right side into the external iliac artery.  Eldridge Abrahams, M.D. [facility] [place] Office: [phone] Pager:   [phone]

## 2012-02-26 ENCOUNTER — Encounter (HOSPITAL_COMMUNITY): Payer: Self-pay | Admitting: Pharmacy Technician

## 2012-02-26 ENCOUNTER — Other Ambulatory Visit: Payer: Self-pay

## 2012-02-26 NOTE — Procedures (Unsigned)
CAROTID DUPLEX EXAM  INDICATION:  Preoperative clearance for AAA repair.  HISTORY: Diabetes:  No Cardiac:  Yes Hypertension:  Yes Smoking:  Previous. Previous Surgery: CV History:  Tinnitus right greater than left for 5 months. Amaurosis Fugax No, Paresthesias No, Hemiparesis No                                      RIGHT             LEFT Brachial systolic pressure:         144               144 Brachial Doppler waveforms:         WNL               WNL Vertebral direction of flow:        Antegrade         Antegrade DUPLEX VELOCITIES (cm/sec) CCA peak systolic                   74                61 ECA peak systolic                   108               80 ICA peak systolic                   68                40 ICA end diastolic                   19                15 PLAQUE MORPHOLOGY:                  Heterogenous      Heterogenous PLAQUE AMOUNT:                      Minimal           Minimal PLAQUE LOCATION:                    ICA               ICA  IMPRESSION: 1. Minimal plaquing of bilateral carotid system. 2. Bilateral vertebral arteries are within normal limits.   ___________________________________________ V. Leia Alf, MD  LT/MEDQ  D:  02/19/2012  T:  02/19/2012  Job:  [id number]

## 2012-02-28 ENCOUNTER — Encounter (HOSPITAL_COMMUNITY)
Admission: RE | Disposition: A | Discharge status: Home / Self Care | Payer: Self-pay | Source: Ambulatory Visit | Attending: Surgery

## 2012-02-28 ENCOUNTER — Ambulatory Visit (HOSPITAL_COMMUNITY)
Admission: RE | Admit: 2012-02-28 | Discharge: 2012-02-28 | Disposition: A | Discharge status: Home / Self Care | Payer: Medicare Other | Source: Ambulatory Visit | Attending: Surgery | Admitting: Surgery

## 2012-02-28 DIAGNOSIS — I10 Essential (primary) hypertension: Secondary | ICD-10-CM | POA: Insufficient documentation

## 2012-02-28 DIAGNOSIS — I252 Old myocardial infarction: Secondary | ICD-10-CM | POA: Insufficient documentation

## 2012-02-28 DIAGNOSIS — I728 Aneurysm of other specified arteries: Secondary | ICD-10-CM | POA: Insufficient documentation

## 2012-02-28 DIAGNOSIS — I714 Abdominal aortic aneurysm, without rupture, unspecified: Secondary | ICD-10-CM | POA: Insufficient documentation

## 2012-02-28 DIAGNOSIS — I2510 Atherosclerotic heart disease of native coronary artery without angina pectoris: Secondary | ICD-10-CM | POA: Insufficient documentation

## 2012-02-28 DIAGNOSIS — Z8546 Personal history of malignant neoplasm of prostate: Secondary | ICD-10-CM | POA: Insufficient documentation

## 2012-02-28 DIAGNOSIS — E785 Hyperlipidemia, unspecified: Secondary | ICD-10-CM | POA: Insufficient documentation

## 2012-02-28 DIAGNOSIS — E039 Hypothyroidism, unspecified: Secondary | ICD-10-CM | POA: Insufficient documentation

## 2012-02-28 HISTORY — PX: EMBOLIZATION: SHX5507

## 2012-02-28 HISTORY — PX: ABDOMINAL AORTAGRAM: SHX5454

## 2012-02-28 HISTORY — PX: OTHER SURGICAL HISTORY: SHX169

## 2012-02-28 LAB — POCT I-STAT, CHEM 8
HCT: 44 % (ref 39.0–52.0)
Hemoglobin: 15 g/dL (ref 13.0–17.0)
Sodium: 140 mEq/L (ref 135–145)
TCO2: 23 mmol/L (ref 0–100)

## 2012-02-28 SURGERY — ABDOMINAL AORTAGRAM
Anesthesia: LOCAL | Laterality: Right

## 2012-02-28 MED ORDER — ACETAMINOPHEN 325 MG PO TABS: 325.0000 mg | ORAL_TABLET | ORAL | Status: DC | PRN

## 2012-02-28 MED ORDER — ALUM & MAG HYDROXIDE-SIMETH 200-200-20 MG/5ML PO SUSP: 15.0000 mL | ORAL | Status: DC | PRN

## 2012-02-28 MED ORDER — HYDRALAZINE HCL 20 MG/ML IJ SOLN: 10.0000 mg | INTRAMUSCULAR | Status: DC | PRN

## 2012-02-28 MED ORDER — FENTANYL CITRATE 0.05 MG/ML IJ SOLN
INTRAMUSCULAR | Status: AC
  Filled 2012-02-28: qty 2

## 2012-02-28 MED ORDER — LIDOCAINE HCL (PF) 1 % IJ SOLN
INTRAMUSCULAR | Status: AC
  Filled 2012-02-28: qty 30

## 2012-02-28 MED ORDER — SODIUM CHLORIDE 0.9 % IV SOLN: 1.0000 mL/kg/h | INTRAVENOUS | Status: DC

## 2012-02-28 MED ORDER — MIDAZOLAM HCL 2 MG/2ML IJ SOLN
INTRAMUSCULAR | Status: AC
  Filled 2012-02-28: qty 2

## 2012-02-28 MED ORDER — OXYCODONE-ACETAMINOPHEN 5-325 MG PO TABS: 1.0000 | ORAL_TABLET | ORAL | Status: DC | PRN

## 2012-02-28 MED ORDER — MORPHINE SULFATE 10 MG/ML IJ SOLN: 2.0000 mg | INTRAMUSCULAR | Status: DC | PRN

## 2012-02-28 MED ORDER — SODIUM CHLORIDE 0.9 % IV SOLN
INTRAVENOUS | Status: DC
  Administered 2012-02-28: 07:00:00 via INTRAVENOUS

## 2012-02-28 MED ORDER — ONDANSETRON HCL 4 MG/2ML IJ SOLN: 4.0000 mg | Freq: Four times a day (QID) | INTRAMUSCULAR | Status: DC | PRN

## 2012-02-28 MED ORDER — LABETALOL HCL 5 MG/ML IV SOLN: 10.0000 mg | INTRAVENOUS | Status: DC | PRN

## 2012-02-28 MED ORDER — ACETAMINOPHEN 325 MG RE SUPP: 325.0000 mg | RECTAL | Status: DC | PRN

## 2012-02-28 MED ORDER — HEPARIN (PORCINE) IN NACL 2-0.9 UNIT/ML-% IJ SOLN
INTRAMUSCULAR | Status: AC
  Filled 2012-02-28: qty 1000

## 2012-02-28 NOTE — Interval H&P Note (Signed)
History and Physical Interval Note:  02/28/2012 8:44 AM  Garrett Jordan Sr.  has presented today for surgery, with the diagnosis of Right hypogastric aneurysm  The various methods of treatment have been discussed with the patient and family. After consideration of risks, benefits and other options for treatment, the patient has consented to  Procedure(s) (LRB): ABDOMINAL AORTAGRAM (N/A) as a surgical intervention .  The patients' history has been reviewed, patient examined, no change in status, stable for surgery.  I have reviewed the patients' chart and labs.  Questions were answered to the patient's satisfaction.     Kayman Snuffer IV, V. WELLS

## 2012-02-28 NOTE — H&P (View-Only) (Signed)
[facility] [place]   Patient name: Garrett RANKIN Sr. MRN: [id number] DOB: 1941/10/13 Sex: male     Chief Complaint  Patient presents with  . 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 He continues to be without abdominal pain. He denies neurologic symptoms. He denies claudication. He has had some ringing in his ear since December. He has a history of coronary artery disease. This has been stable.  Past Medical History  Diagnosis Date  . AAA (abdominal aortic aneurysm)   . CAD (coronary artery disease)   . Hypertension   . Atrial fibrillation   . Myocardial infarction     sp inferior  . Hyperlipidemia   . Cancer     prostate  . Thyroid disease     hypothyroidism  . Arthritis   . Reflux   . Gout   . Nerve compression     right leg    Past Surgical History  Procedure Date  . Coronary artery bypass graft   . Parathyroid adenoma   . Spine surgery   . Kidney stone surgery   . Tonsillectomy   . Prostate surgery     rad seeds  . Coronary stent placement 2009    RCA    History   Social History  . Marital Status: Divorced    Spouse Name: N/A    Number of Children: N/A  . Years of Education: N/A   Occupational History  . Not on file.   Social History Main Topics  . Smoking status: Former Smoker    Types: Cigarettes    Quit date: 08/01/1970  . Smokeless tobacco: Never Used  . Alcohol Use: Yes     1-3 glasses 3-4 times a week  . Drug Use:   . Sexually Active:    Other Topics Concern  . Not on file   Social History Narrative  . No narrative on file    Family History  Problem Relation Age of Onset  . Heart disease Father   . Heart attack Father   . Stroke Mother   . Heart disease Sister   . Heart disease Paternal Uncle   . Diabetes Maternal Grandmother     Allergies as of 02/19/2012 - Review Complete  02/19/2012  Allergen Reaction Noted  . Ace inhibitors Cough 07/03/2011  . Lopressor (metoprolol tartrate) Cough 07/03/2011  . Nitroglycerin Other (See Comments) 08/21/2011    Current Outpatient Prescriptions on File Prior to Visit  Medication Sig Dispense Refill  . allopurinol (ZYLOPRIM) 300 MG tablet Take 300 mg by mouth at bedtime.        Marland Kitchen amLODipine (NORVASC) 5 MG tablet Take 5 mg by mouth daily.        Marland Kitchen aspirin 81 MG tablet Take 81 mg by mouth daily.        . clonazePAM (KLONOPIN) 1 MG tablet Take 1 mg by mouth at bedtime as needed.        . diclofenac (VOLTAREN) 75 MG EC tablet Take 75 mg by mouth daily.        Marland Kitchen diltiazem (CARDIZEM CD) 240 MG 24 hr capsule Take 240 mg by mouth daily.        . famotidine (PEPCID) 20 MG tablet Take 20 mg by mouth at bedtime as needed.       . fish oil-omega-3 fatty acids  1000 MG capsule Take 1,200 mg by mouth daily.       Marland Kitchen levothyroxine (SYNTHROID, LEVOTHROID) 150 MCG tablet Take 150 mcg by mouth daily.        Marland Kitchen loratadine-pseudoephedrine (CLARITIN-D 24-HOUR) 10-240 MG per 24 hr tablet Take 1 tablet by mouth daily.        . metoprolol (TOPROL-XL) 100 MG 24 hr tablet Take 100 mg by mouth daily.        . Multiple Vitamin (MULTIVITAMIN PO) Take 1 tablet by mouth daily.        . niacin (NIASPAN) 1000 MG CR tablet Take 1,000 mg by mouth at bedtime.        Marland Kitchen olmesartan-hydrochlorothiazide (BENICAR HCT) 40-25 MG per tablet Take 1 tablet by mouth daily.        . rosuvastatin (CRESTOR) 10 MG tablet Take 10 mg by mouth daily.        . Tamsulosin HCl (FLOMAX) 0.4 MG CAPS Take 0.4 mg by mouth. One q other day        No current facility-administered medications on file prior to visit.     REVIEW OF SYSTEMS: Per the patient, there have been no changes since the last visit with the exception of ringing in his ears  PHYSICAL EXAMINATION:   Vital signs are BP 158/87  Pulse 68  Temp(Src) 98 F (36.7 C) (Oral)  Ht 5\' 10"  (1.778 m)  Wt 211 lb 9.6 oz (95.981  kg)  BMI 30.36 kg/m2 General: The patient appears their stated age. HEENT:  No gross abnormalities Pulmonary:  Non labored breathing Abdomen: Soft and non-tender Musculoskeletal: There are no major deformities. Neurologic: No focal weakness or paresthesias are detected, Skin: There are no ulcer or rashes noted. Psychiatric: The patient has normal affect. Cardiovascular: There is a regular rate and rhythm without significant murmur appreciated.   Diagnostic Studies CT angiogram: Interval increase in the size of his aneurysm, maximum diameter is now 5.2 cm. The right hypogastric artery aneurysm has a maximum diameter of 2.4 cm.  Carotid duplex: Minimal plaquing and disease bilaterally  ABI: 1.1 on the right 1.1 the left, both with triphasic waveforms  Assessment: Abdominal aortic aneurysm, right hypogastric aneurysm Plan: Based on the size increase since his last visit I have recommended proceeding with repair. I believe this can be done percutaneously, however with his multiple catheterizations and reports of scar tissue in his right groin, he may require a cutdown of the right groin. We discussed addressing his right hypogastric aneurysm at this time. I feel this will be best managed in 2 stages. On Wednesday, April 17 I will proceed with left groin access and cool embolization of the outflow tract of his right hypogastric artery. I will give him 6 weeks to recover from this we discussed the risks and benefits of the operation including the risk of cardiopulmonary complications, the risk of stroke and death, the risk of gastrointestinal and lower extremity embolic complications. All of his questions were answered today. I have scheduled his nominal aneurysm repair for may 15.and then proceed with endovascular aneurysm repair with extension of the right side into the external iliac artery.  Eldridge Abrahams, M.D. [facility] [place] Office: [phone] Pager:   [phone]

## 2012-02-28 NOTE — Op Note (Signed)
[facility] [place]  Patient name: Garrett SLIFE Sr. MRN: [id number] DOB: [birth year] Sex: male  02/28/2012 Pre-operative Diagnosis: Abdominal aortic aneurysm Post-operative diagnosis:  Same Surgeon:  Eldridge Abrahams Procedure Performed:  1.  ultrasound access left femoral artery  2.  abdominal aortogram  3.  pelvic angiogram  4.  second order catheterization  5.  coil embolization right hypogastric artery    Indications:  The patient has an abdominal aortic aneurysm requiring repair. He also has a right hypogastric aneurysm. He comes in today for coil embolization. He will get his aneurysm repaired at a later date  Procedure:  The patient was identified in the holding area and taken to room 8.  The patient was then placed supine on the table and prepped and draped in the usual sterile fashion.  A time out was called.  Ultrasound was used to evaluate the left common femoral artery.  It was patent .  A digital ultrasound image was acquired.  The left common femoral artery was accessed under ultrasound guidance with an 18-gauge needle. An 035 wire was advanced into the aorta under fluoroscopic visualization. A 5 French sheath was placed. A marker pigtail catheter was placed at the level of L1 and an abdominal aortogram was performed.  The catheter was pulled down to the aortic bifurcation and a pelvic angiogram was performed.  Findings:   Aortogram:  Aneurysmal changes of the infrarenal abdominal aorta are visualized. There are single renal arteries bilaterally which are widely patent. Both iliac arterial systems a very tortuous. The common external and internal iliac arteries are all patent without stenosis. The aneurysmal changes to the right hypogastric artery are difficult to delineate by angiography  Intervention:  After the above images were performed the decision was made to proceed. I cross the aortic bifurcation with a crossover catheter. A 4 French straight  catheter was able to be advanced into the right external iliac artery. I used a Amplatz superstiff wire and then exchanged out for a 6 [place] Ansel 1 sheath. The Ansel 1 sheath was advanced over the aortic bifurcation into the common iliac artery. I then used a crossover catheter to get wire access into the right hypogastric artery. A KMP catheter was then advanced down into the right hypogastric artery. Contrast injections were performed with the catheter in the hypogastric artery. This revealed the anterior and posterior branches of the hypogastric artery as well as the location of the aneurysm. I then proceeded with coil embolization. I initially placed a 1 10 mm nester coil when being just proximal to the hypogastric artery branch. I then went on to place 6 additional 12 mm nester coils. Multiple followup injections were performed. Ultimately there was successful coil embolization of the right hypogastric artery. Once I was satisfied with the embolization the catheter and sheath were withdrawn to the left external iliac artery. The patient will go to the holding area for sheath pull. He did develop a hematoma in the left groin during the diagnostic portion of the procedure. I held pressure and this seemed to flatten out. I then upsized to a 6 [place] sheath for the intervention and there was no further increase in size.  Impression:  #1  aneurysmal changes to the abdominal aorta  #2  successful caudal embolization of the right hypogastric artery using 10 and 12 mm nester coils   V. Annamarie Major, M.D. [facility] [place] Office: [phone] Pager:  [phone]

## 2012-02-28 NOTE — Discharge Instructions (Signed)

## 2012-03-04 ENCOUNTER — Telehealth: Payer: Self-pay

## 2012-03-04 NOTE — Telephone Encounter (Signed)
Pt. called with report of experiencing right buttock pain when standing or walking.  States the pain goes from right buttocks down to knee.  Able to make 2 passes with lawn mower, and then has to stop to rest, due to pain.  S/p coil embolization (R) hypogastric artery of 4/24.  Describes pain as being "similar to sciatic pain".  Informed Dr. Trula Slade.  Advised to schedule pt. To come to office Mon. 4/29, and to place hold on plan for other procedure 5/16, at this time.  Pt. advised of appt. 4/29 at 8:30 am.  Verbalized understanding.

## 2012-03-08 ENCOUNTER — Encounter: Payer: Self-pay | Admitting: Surgery

## 2012-03-11 ENCOUNTER — Encounter: Payer: Self-pay | Admitting: Surgery

## 2012-03-11 ENCOUNTER — Ambulatory Visit (INDEPENDENT_AMBULATORY_CARE_PROVIDER_SITE_OTHER): Payer: Medicare Other | Admitting: Surgery

## 2012-03-11 VITALS — BP 143/89 | HR 61 | Temp 97.8°F | Ht 70.0 in | Wt 212.0 lb

## 2012-03-11 DIAGNOSIS — I714 Abdominal aortic aneurysm, without rupture, unspecified: Secondary | ICD-10-CM

## 2012-03-11 NOTE — Progress Notes (Signed)
[facility] [place]   Patient name: Garrett GRAFFEO Sr. MRN: [id number] DOB: Jul 22, 1941 Sex: male     Chief Complaint  Patient presents with  . Re-evaluation    abdominal aortagram embolization on 02/28/2012, pt c/o pain in  lateral aspect of buttock    HISTORY OF PRESENT ILLNESS: The patient comes back today for followup. He is status post coil embolization of his right hypogastric artery on 02/28/2012 in anticipation of exclusion of his 5.2 cm abdominal aortic aneurysm. He has a hypogastric aneurying his procedure he began to develop right buttock sm on the right with maximum diameter of 2.4 cm. Follow he states that this occurs at approximately 100 feet. He goes away with rest. ing his procedure he began to develop right buttock claudication. He is able to angulate around his house without difficulty however with activity such as mowing the lawn he gets a burning feeling in his right hip which extends into his right thigh.  Past Medical History  Diagnosis Date  . AAA (abdominal aortic aneurysm)   . CAD (coronary artery disease)   . Hypertension   . Atrial fibrillation   . Myocardial infarction     sp inferior  . Hyperlipidemia   . Cancer     prostate  . Thyroid disease     hypothyroidism  . Arthritis   . Reflux   . Gout   . Nerve compression     right leg    Past Surgical History  Procedure Date  . Coronary artery bypass graft   . Parathyroid adenoma   . Spine surgery   . Kidney stone surgery   . Tonsillectomy   . Prostate surgery     rad seeds  . Coronary stent placement 2009    RCA  . Abdominal aortagram embolization 02/28/2012     History   Social History  . Marital Status: Divorced    Spouse Name: N/A    Number of Children: N/A  . Years of Education: N/A   Occupational History  . Not on file.   Social History Main Topics  . Smoking status: Former Smoker    Types: Cigarettes    Quit date: 08/01/1970  . Smokeless tobacco: Never  Used  . Alcohol Use: Yes     1-3 glasses 3-4 times a week  . Drug Use:   . Sexually Active:    Other Topics Concern  . Not on file   Social History Narrative  . No narrative on file    Family History  Problem Relation Age of Onset  . Heart disease Father   . Heart attack Father   . Stroke Mother   . Heart disease Sister   . Heart disease Paternal Uncle   . Diabetes Maternal Grandmother     Allergies as of 03/11/2012 - Review Complete 03/11/2012  Allergen Reaction Noted  . Ace inhibitors Cough 07/03/2011  . Lopressor (metoprolol tartrate) Cough 07/03/2011  . Nitroglycerin Other (See Comments) 08/21/2011    Current Outpatient Prescriptions on File Prior to Visit  Medication Sig Dispense Refill  . allopurinol (ZYLOPRIM) 300 MG tablet Take 300 mg by mouth at bedtime.        Marland Kitchen amLODipine (NORVASC) 5 MG tablet Take 5 mg by mouth daily.        Marland Kitchen aspirin 81 MG tablet Take 81 mg by mouth daily.        . clonazePAM (KLONOPIN) 1 MG tablet Take 0.5 mg by mouth at  bedtime.       . Cyanocobalamin (VITAMIN B-12 CR PO) Take by mouth.      . diclofenac (VOLTAREN) 75 MG EC tablet Take 75 mg by mouth daily.        Marland Kitchen diltiazem (CARDIZEM CD) 240 MG 24 hr capsule Take 240 mg by mouth daily.        . famotidine (PEPCID) 20 MG tablet Take 20 mg by mouth daily.       . fish oil-omega-3 fatty acids 1000 MG capsule Take 1,200 mg by mouth 2 (two) times daily.       . fluticasone (FLONASE) 50 MCG/ACT nasal spray Place 2 sprays into the nose daily as needed. Nasal allergies      . levothyroxine (SYNTHROID, LEVOTHROID) 150 MCG tablet Take 150 mcg by mouth daily.        Marland Kitchen loratadine-pseudoephedrine (CLARITIN-D 24-HOUR) 10-240 MG per 24 hr tablet Take 1 tablet by mouth daily.        . metoprolol (TOPROL-XL) 100 MG 24 hr tablet Take 100 mg by mouth daily.        . Multiple Vitamin (MULTIVITAMIN PO) Take 1 tablet by mouth daily.        . niacin (NIASPAN) 1000 MG CR tablet Take 1,000 mg by mouth at  bedtime.        Marland Kitchen olmesartan-hydrochlorothiazide (BENICAR HCT) 40-25 MG per tablet Take 1 tablet by mouth daily.        . rosuvastatin (CRESTOR) 10 MG tablet Take 10 mg by mouth daily.        . Tamsulosin HCl (FLOMAX) 0.4 MG CAPS Take 0.4 mg by mouth. One q other day          REVIEW OF SYSTEMS:  please see history of present illness. Otherwise, no changes  PHYSICAL EXAMINATION:   Vital signs are BP 143/89  Pulse 61  Temp(Src) 97.8 F (36.6 C) (Oral)  Ht 5\' 10"  (1.778 m)  Wt 212 lb (96.163 kg)  BMI 30.42 kg/m2 General: The patient appears their stated age. HEENT:  No gross abnormalities Pulmonary:  Non labored breathing Abdomen: Soft and non-tender Musculoskeletal: There are no major deformities. Neurologic: No focal weakness or paresthesias are detected, Skin: There are no ulcer or rashes noted. Psychiatric: The patient has normal affect. Cardiovascular: Palpable pedal pulses bilaterally   Diagnostic Studies  none  Assessment: Abdominal aortic aneurysm status post right hypogastric artery embolization Plan:  the patient has developed right buttock claudication following coil embolization of his right hypogastric artery in anticipation of endovascular abdominal aortic aneurysm repair. He does state that this has improved over the past week or so. He does not have any overt signs of ischemia. He denies sexual dysfunction. I have explained to him that I would like to give this more time to see if he continues to recover and improve with his activity level this could potentially need to laying his aneurysm repair. If however he continues to have significant lifestyle issues with his claudication need to consider an external to internal iliac artery bypass graft in the same setting of his aneurysm repair. I'm going to order a CT angiogram to further assess his hypogastric anatomy following his coil embolization.   Eldridge Abrahams, M.D. [facility]  [place] Office: [phone] Pager:  [phone]

## 2012-03-13 ENCOUNTER — Encounter (HOSPITAL_COMMUNITY): Payer: Self-pay | Admitting: Pharmacy Technician

## 2012-03-20 ENCOUNTER — Other Ambulatory Visit (HOSPITAL_COMMUNITY): Payer: Medicare Other

## 2012-03-28 ENCOUNTER — Encounter (HOSPITAL_COMMUNITY): Admission: RE | Payer: Self-pay | Source: Ambulatory Visit

## 2012-03-28 ENCOUNTER — Ambulatory Visit (HOSPITAL_COMMUNITY): Admission: RE | Admit: 2012-03-28 | Payer: Medicare Other | Source: Ambulatory Visit | Admitting: Surgery

## 2012-03-28 SURGERY — INSERTION, ENDOVASCULAR STENT GRAFT, AORTA, ABDOMINAL
Anesthesia: General

## 2012-04-12 ENCOUNTER — Encounter: Payer: Self-pay | Admitting: Surgery

## 2012-04-15 ENCOUNTER — Ambulatory Visit: Payer: Medicare Other | Admitting: Surgery

## 2012-04-15 ENCOUNTER — Ambulatory Visit
Admission: RE | Admit: 2012-04-15 | Discharge: 2012-04-15 | Disposition: A | Discharge status: Home / Self Care | Payer: Medicare Other | Source: Ambulatory Visit | Attending: Surgery | Admitting: Surgery

## 2012-04-15 ENCOUNTER — Encounter: Payer: Self-pay | Admitting: Surgery

## 2012-04-15 ENCOUNTER — Ambulatory Visit (INDEPENDENT_AMBULATORY_CARE_PROVIDER_SITE_OTHER): Payer: Medicare Other | Admitting: Surgery

## 2012-04-15 VITALS — BP 124/77 | HR 67 | Temp 98.2°F | Ht 70.0 in | Wt 208.0 lb

## 2012-04-15 DIAGNOSIS — I714 Abdominal aortic aneurysm, without rupture: Secondary | ICD-10-CM

## 2012-04-15 MED ORDER — IOHEXOL 350 MG/ML SOLN
100.0000 mL | Freq: Once | INTRAVENOUS | Status: AC | PRN
  Administered 2012-04-15: 100 mL via INTRAVENOUS

## 2012-04-15 NOTE — Progress Notes (Signed)
The patient comes back today for followup of his abdominal aortic aneurysm. He is status post coil embolization of the right hypogastric artery 4 treatment of the hypogastric aneurysm as well as future planning of endovascular repair of his abdominal aortic aneurysm. Unfortunately, the patient developed severe buttock claudication following his embolization. For that reason ablate his operation. Over the past month he has noted a significant improvement in his symptoms. He can now walk approximately 1 mile before he has symptoms. He is able to rest for 30 seconds and the pain goes away and he can walk for another mile. This is a dramatic improvement.  I have ordered a CT scan for today to determine whether or not he would be a potential candidate for external to internal iliac bypass to alleviate his symptoms. I reviewed the scan unfortunately, it is difficult to fully evaluate his hypogastric artery branches on the right given the artifact from the embolization coils. His abdominal aortic aneurysm maximum diameter is 5.2.  I discussed the results of the CT scan with the patient. Since he continues to have improvement of his symptoms, I have recommended delaying his endovascular aneurysm repair. I do have him come by to see me on September 9. We will make plans for his repair at that time. I spent approximately 40 minutes reviewing the patient's CT scan and discussing the results with the patient today.

## 2012-06-24 ENCOUNTER — Other Ambulatory Visit: Payer: Self-pay | Admitting: *Deleted

## 2012-06-24 DIAGNOSIS — R109 Unspecified abdominal pain: Secondary | ICD-10-CM

## 2012-06-24 DIAGNOSIS — I714 Abdominal aortic aneurysm, without rupture: Secondary | ICD-10-CM

## 2012-06-26 ENCOUNTER — Other Ambulatory Visit: Payer: Medicare Other

## 2012-06-26 ENCOUNTER — Ambulatory Visit
Admission: RE | Admit: 2012-06-26 | Discharge: 2012-06-26 | Disposition: A | Discharge status: Home / Self Care | Payer: Medicare Other | Source: Ambulatory Visit | Attending: Surgery | Admitting: Surgery

## 2012-06-26 DIAGNOSIS — I714 Abdominal aortic aneurysm, without rupture: Secondary | ICD-10-CM

## 2012-06-26 DIAGNOSIS — R109 Unspecified abdominal pain: Secondary | ICD-10-CM

## 2012-06-26 MED ORDER — IOHEXOL 350 MG/ML SOLN
100.0000 mL | Freq: Once | INTRAVENOUS | Status: AC | PRN
  Administered 2012-06-26: 100 mL via INTRAVENOUS

## 2012-06-28 DIAGNOSIS — N400 Enlarged prostate without lower urinary tract symptoms: Secondary | ICD-10-CM | POA: Insufficient documentation

## 2012-06-28 DIAGNOSIS — Z8639 Personal history of other endocrine, nutritional and metabolic disease: Secondary | ICD-10-CM | POA: Insufficient documentation

## 2012-06-28 DIAGNOSIS — K8020 Calculus of gallbladder without cholecystitis without obstruction: Secondary | ICD-10-CM | POA: Insufficient documentation

## 2012-06-28 DIAGNOSIS — M519 Unspecified thoracic, thoracolumbar and lumbosacral intervertebral disc disorder: Secondary | ICD-10-CM | POA: Insufficient documentation

## 2012-06-28 DIAGNOSIS — Z951 Presence of aortocoronary bypass graft: Secondary | ICD-10-CM | POA: Insufficient documentation

## 2012-06-28 DIAGNOSIS — I2510 Atherosclerotic heart disease of native coronary artery without angina pectoris: Secondary | ICD-10-CM | POA: Insufficient documentation

## 2012-06-28 DIAGNOSIS — Z8546 Personal history of malignant neoplasm of prostate: Secondary | ICD-10-CM | POA: Insufficient documentation

## 2012-06-28 DIAGNOSIS — I119 Hypertensive heart disease without heart failure: Secondary | ICD-10-CM | POA: Insufficient documentation

## 2012-06-28 DIAGNOSIS — E785 Hyperlipidemia, unspecified: Secondary | ICD-10-CM | POA: Insufficient documentation

## 2012-06-28 DIAGNOSIS — N200 Calculus of kidney: Secondary | ICD-10-CM | POA: Insufficient documentation

## 2012-06-28 DIAGNOSIS — E039 Hypothyroidism, unspecified: Secondary | ICD-10-CM | POA: Insufficient documentation

## 2012-06-28 DIAGNOSIS — M109 Gout, unspecified: Secondary | ICD-10-CM | POA: Insufficient documentation

## 2012-07-14 HISTORY — PX: ABDOMINAL AORTIC ANEURYSM REPAIR: SUR1152

## 2012-07-19 ENCOUNTER — Encounter: Payer: Self-pay | Admitting: Surgery

## 2012-07-22 ENCOUNTER — Ambulatory Visit (INDEPENDENT_AMBULATORY_CARE_PROVIDER_SITE_OTHER): Payer: Medicare Other | Admitting: Surgery

## 2012-07-22 ENCOUNTER — Encounter: Payer: Self-pay | Admitting: Surgery

## 2012-07-22 VITALS — BP 150/92 | HR 58 | Resp 16 | Ht 70.0 in | Wt 208.0 lb

## 2012-07-22 DIAGNOSIS — I714 Abdominal aortic aneurysm, without rupture: Secondary | ICD-10-CM

## 2012-07-22 NOTE — Progress Notes (Signed)
[facility] [place]   Patient name: Garrett Jordan. MRN: [id number] DOB: 12-01-1940 Sex: male     Chief Complaint  Patient presents with  . AAA    f/u to discuss repair    HISTORY OF PRESENT ILLNESS: The patient is back today for further followup of his abdominal aortic aneurysm. He was scheduled to have this repaired in may. He underwent preoperative coil embolization of his right hypogastric artery. He then developed right buttock claudication. This did improve and therefore we elected to delay his operation to give him a chance to collateralize better to his pelvis. Several weeks ago he had an episode of blood in his stool. He was sent for a CT scan to rule out any potential vascular cause. He also saw a gastroenterologist. This was thought to be secondary to hemorrhoids or a polyp. He did not receive endoscopy.  The patient states that his buttock claudication has nearly resolved. He is able to play tennis without any significant limitations. He denies any abdominal pain or claudication symptoms.  The patient has a history of myocardial infarction. He is undergone coronary artery bypass grafting. He does not have chest pain currently. He is maintained only on aspirin and beta blocker.Marland Kitchen He is medically managed for his hypertension and hypercholesterolemia appear  Past Medical History  Diagnosis Date  . AAA (abdominal aortic aneurysm)   . CAD (coronary artery disease)   . Hypertension   . Atrial fibrillation   . Myocardial infarction     sp inferior  . Hyperlipidemia   . Cancer     prostate  . Thyroid disease     hypothyroidism  . Arthritis   . Reflux   . Gout   . Nerve compression     right leg    Past Surgical History  Procedure Date  . Coronary artery bypass graft   . Parathyroid adenoma   . Spine surgery   . Kidney stone surgery   . Tonsillectomy   . Prostate surgery     rad seeds  . Coronary stent placement 2009    RCA  . Abdominal  aortagram embolization 02/28/2012    History   Social History  . Marital Status: Divorced    Spouse Name: N/A    Number of Children: N/A  . Years of Education: N/A   Occupational History  . Not on file.   Social History Main Topics  . Smoking status: Former Smoker    Types: Cigarettes    Quit date: 08/01/1970  . Smokeless tobacco: Never Used  . Alcohol Use: Yes     1-3 glasses 3-4 times a week  . Drug Use: No  . Sexually Active: Not on file   Other Topics Concern  . Not on file   Social History Narrative  . No narrative on file    Family History  Problem Relation Age of Onset  . Heart disease Father   . Heart attack Father   . Stroke Mother   . Heart disease Sister   . Heart disease Paternal Uncle   . Diabetes Maternal Grandmother     Allergies as of 07/22/2012 - Review Complete 07/22/2012  Allergen Reaction Noted  . Ace inhibitors Cough 07/03/2011  . Lopressor (metoprolol tartrate) Cough 07/03/2011  . Nitroglycerin Other (See Comments) 08/21/2011    Current Outpatient Prescriptions on File Prior to Visit  Medication Sig Dispense Refill  . acetaminophen (TYLENOL) 650 MG CR tablet Take 650 mg by mouth  every 8 (eight) hours as needed. For pain      . allopurinol (ZYLOPRIM) 300 MG tablet Take 300 mg by mouth at bedtime.        Marland Kitchen amLODipine (NORVASC) 5 MG tablet Take 5 mg by mouth daily.        Marland Kitchen aspirin 81 MG tablet Take 81 mg by mouth daily.        . clonazePAM (KLONOPIN) 1 MG tablet Take 0.5 mg by mouth at bedtime.       . Cyanocobalamin (VITAMIN B-12 CR PO) Take 5,000 mcg by mouth daily.       Marland Kitchen diltiazem (CARDIZEM CD) 240 MG 24 hr capsule Take 240 mg by mouth daily.        . famotidine (PEPCID) 20 MG tablet Take 20 mg by mouth daily.       . fish oil-omega-3 fatty acids 1000 MG capsule Take 1,200 mg by mouth 2 (two) times daily.       . fluticasone (FLONASE) 50 MCG/ACT nasal spray Place 2 sprays into the nose daily as needed. Nasal allergies      .  levothyroxine (SYNTHROID, LEVOTHROID) 150 MCG tablet Take 150 mcg by mouth daily.        Marland Kitchen loratadine-pseudoephedrine (CLARITIN-D 24-HOUR) 10-240 MG per 24 hr tablet Take 1 tablet by mouth daily.        . metoprolol (TOPROL-XL) 100 MG 24 hr tablet Take 100 mg by mouth daily.        . Multiple Vitamin (MULTIVITAMIN PO) Take 1 tablet by mouth daily.        . niacin (NIASPAN) 1000 MG CR tablet Take 1,000 mg by mouth at bedtime.        Marland Kitchen olmesartan-hydrochlorothiazide (BENICAR HCT) 40-25 MG per tablet Take 1 tablet by mouth daily.        . rosuvastatin (CRESTOR) 10 MG tablet Take 10 mg by mouth daily.        . Tamsulosin HCl (FLOMAX) 0.4 MG CAPS Take 0.4 mg by mouth. One q other day       . diclofenac (VOLTAREN) 75 MG EC tablet Take 75 mg by mouth daily.           REVIEW OF SYSTEMS: For review of systems was done by the patient and the encounter form. All were negative.  PHYSICAL EXAMINATION:   Vital signs are BP 150/92  Pulse 58  Resp 16  Ht 5\' 10"  (1.778 m)  Wt 208 lb (94.348 kg)  BMI 29.84 kg/m2  SpO2 100% General: The patient appears their stated age. HEENT:  No gross abnormalities Pulmonary:  Non labored breathing Abdomen: Soft and non-tender. Aorta is nontender. Musculoskeletal: There are no major deformities. Neurologic: No focal weakness or paresthesias are detected, Skin: There are no ulcer or rashes noted. Psychiatric: The patient has normal affect. Cardiovascular: There is a regular rate and rhythm without significant murmur appreciated. Palpable femoral pulses. No carotid bruits   Diagnostic Studies The patient's carotid ultrasound was reviewed this shows minimal carotid disease bilaterally. Ankle-brachial indices are greater than 1 and triphasic  I have reviewed his recent CT scan this shows that the aneurysm has increased in size to 5.4 cm. The inferior mesenteric artery is patent  Assessment: Abdominal aortic aneurysm Plan: The patient's aneurysm continues to  increase in size. We are at the point where I would recommend proceeding with repair. Because of his history of buttock claudication as well as the presence of a large IMA on  imaging studies we discussed extensively our options of endovascular versus open repair. We have decided to proceed with endovascular repair. We discussed extensively the risks which include potentially exacerbating his buttock claudication. There is also the possibility of colon ischemia. All his questions were answered today. We'll schedule his operation for Thursday, September 26. I will also discuss this with Dr. Wynonia Lawman, his cardiologist.  Eldridge Abrahams, M.D. [facility] [place] Office: [phone] Pager:  [phone]

## 2012-07-25 ENCOUNTER — Other Ambulatory Visit: Payer: Self-pay

## 2012-07-26 ENCOUNTER — Encounter (HOSPITAL_COMMUNITY): Payer: Self-pay | Admitting: Pharmacist

## 2012-07-31 ENCOUNTER — Encounter (HOSPITAL_COMMUNITY)
Admission: RE | Admit: 2012-07-31 | Discharge: 2012-07-31 | Disposition: A | Discharge status: Home / Self Care | Payer: Medicare Other | Source: Ambulatory Visit | Attending: Surgery | Admitting: Surgery

## 2012-07-31 ENCOUNTER — Encounter (HOSPITAL_COMMUNITY): Payer: Self-pay

## 2012-07-31 HISTORY — DX: Gastro-esophageal reflux disease without esophagitis: K21.9

## 2012-07-31 HISTORY — DX: Other seasonal allergic rhinitis: J30.2

## 2012-07-31 HISTORY — DX: Hypothyroidism, unspecified: E03.9

## 2012-07-31 LAB — URINALYSIS, ROUTINE W REFLEX MICROSCOPIC
Bilirubin Urine: NEGATIVE
Leukocytes, UA: NEGATIVE
Nitrite: NEGATIVE
Specific Gravity, Urine: 1.008 (ref 1.005–1.030)
Urobilinogen, UA: 0.2 mg/dL (ref 0.0–1.0)
pH: 7.5 (ref 5.0–8.0)

## 2012-07-31 LAB — TYPE AND SCREEN: Antibody Screen: NEGATIVE

## 2012-07-31 LAB — BLOOD GAS, ARTERIAL
Acid-base deficit: 0.7 mmol/L (ref 0.0–2.0)
Drawn by: 344381
FIO2: 0.21 %
O2 Saturation: 94.7 %
pCO2 arterial: 32.2 mmHg — ABNORMAL LOW (ref 35.0–45.0)
pO2, Arterial: 67.9 mmHg — ABNORMAL LOW (ref 80.0–100.0)

## 2012-07-31 LAB — PROTIME-INR: INR: 1.13 (ref 0.00–1.49)

## 2012-07-31 LAB — CBC
MCH: 31.2 pg (ref 26.0–34.0)
MCV: 86.9 fL (ref 78.0–100.0)
Platelets: 148 10*3/uL — ABNORMAL LOW (ref 150–400)
RBC: 4.97 MIL/uL (ref 4.22–5.81)
RDW: 13.2 % (ref 11.5–15.5)
WBC: 5.9 10*3/uL (ref 4.0–10.5)

## 2012-07-31 LAB — COMPREHENSIVE METABOLIC PANEL
ALT: 31 U/L (ref 0–53)
AST: 27 U/L (ref 0–37)
Albumin: 4 g/dL (ref 3.5–5.2)
CO2: 21 mEq/L (ref 19–32)
Calcium: 10.7 mg/dL — ABNORMAL HIGH (ref 8.4–10.5)
Creatinine, Ser: 0.94 mg/dL (ref 0.50–1.35)
Sodium: 137 mEq/L (ref 135–145)

## 2012-07-31 LAB — APTT: aPTT: 27 seconds (ref 24–37)

## 2012-07-31 NOTE — Pre-Procedure Instructions (Signed)
[place].  07/31/2012   Your procedure is scheduled on:  Thursday September 26  Report to [facility] at 5:30 AM.  Call this number if you have problems the morning of surgery: [phone]   Remember:   Do not eat or drink:After Midnight.    Take these medicines the morning of surgery with A SIP OF WATER: amlodipine (Norvasc), levothyroxine (Synthroid), metoprolol (Toprol). May use Flonase nasal spray.     Do not wear jewelry, make-up or nail polish.  Do not wear lotions, powders, or perfumes. You may wear deodorant.  Do not shave 48 hours prior to surgery. Men may shave face and neck.  Do not bring valuables to the hospital.  Contacts, dentures or bridgework may not be worn into surgery.  Leave suitcase in the car. After surgery it may be brought to your room.  For patients admitted to the hospital, checkout time is 11:00 AM the day of discharge.   Patients discharged the day of surgery will not be allowed to drive home.  Name and phone number of your driver: NA  Special Instructions: CHG Shower Use Special Wash: 1/2 bottle night before surgery and 1/2 bottle morning of surgery.   Please read over the following fact sheets that you were given: Pain Booklet, Coughing and Deep Breathing, Blood Transfusion Information and Surgical Site Infection Prevention

## 2012-07-31 NOTE — Progress Notes (Signed)
Requested last OV, any recent cardiac test results from Dr. Tollie Eth.

## 2012-07-31 NOTE — Progress Notes (Signed)
Left chart for anesthesia to review EKG, cardiac hx.

## 2012-08-01 NOTE — Consult Note (Signed)
Anesthesia Chart Review:  Patient is a 71 year old male scheduled for EVAR of AAA on 08/08/12.  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    His Cardiologist is Dr. Wynonia Lawman, who felt patient was okay to proceed with this procedure.  EKG on 06/24/12 showed NSR, first degree AVB, right BBB, inferior infarct (age undetermined).  His notes indicate that his last treadmill stress test was > 3 years ago (August 2010).  His last cardiac cath is from 04/27/09 prior to his CABG.  (See Notes tab.)  Echo on 04/28/09 showed: Left ventricle: The cavity size was normal. Wall thickness wasincreased in a pattern of moderate LVH. Systolic function was normal. Wall motion was normal; there were no regional wall motion abnormalities. There was an increased relative contribution of atrial contraction to ventricular filling. Doppler parameters are consistent with abnormal left ventricular relaxation (grade 1diastolic dysfunction).   CXR on 07/31/12 showed post CABG.  No acute abnormalities.  Labs noted.  Anticipate he can proceed as planned.  Myra Gianotti, PA-C

## 2012-08-07 MED ORDER — DEXTROSE 5 % IV SOLN
1.5000 g | INTRAVENOUS | Status: AC
  Administered 2012-08-08: 1.5 g via INTRAVENOUS
  Filled 2012-08-07: qty 1.5

## 2012-08-07 MED ORDER — SODIUM CHLORIDE 0.9 % IV SOLN: INTRAVENOUS | Status: DC

## 2012-08-08 ENCOUNTER — Encounter (HOSPITAL_COMMUNITY): Payer: Self-pay | Admitting: Vascular Surgery

## 2012-08-08 ENCOUNTER — Telehealth: Payer: Self-pay | Admitting: Surgery

## 2012-08-08 ENCOUNTER — Inpatient Hospital Stay (HOSPITAL_COMMUNITY): Payer: Medicare Other

## 2012-08-08 ENCOUNTER — Encounter (HOSPITAL_COMMUNITY)
Admission: RE | Disposition: A | Discharge status: Home / Self Care | Payer: Self-pay | Source: Ambulatory Visit | Attending: Surgery

## 2012-08-08 ENCOUNTER — Inpatient Hospital Stay (HOSPITAL_COMMUNITY)
Admission: RE | Admit: 2012-08-08 | Discharge: 2012-08-09 | DRG: 238 | Disposition: A | Discharge status: Home / Self Care | Payer: Medicare Other | Source: Ambulatory Visit | Attending: Surgery | Admitting: Surgery

## 2012-08-08 ENCOUNTER — Other Ambulatory Visit: Payer: Self-pay | Admitting: *Deleted

## 2012-08-08 ENCOUNTER — Ambulatory Visit (HOSPITAL_COMMUNITY): Payer: Medicare Other | Admitting: Vascular Surgery

## 2012-08-08 ENCOUNTER — Encounter (HOSPITAL_COMMUNITY): Payer: Self-pay | Admitting: *Deleted

## 2012-08-08 DIAGNOSIS — I252 Old myocardial infarction: Secondary | ICD-10-CM

## 2012-08-08 DIAGNOSIS — I714 Abdominal aortic aneurysm, without rupture, unspecified: Secondary | ICD-10-CM

## 2012-08-08 DIAGNOSIS — Z951 Presence of aortocoronary bypass graft: Secondary | ICD-10-CM

## 2012-08-08 DIAGNOSIS — E785 Hyperlipidemia, unspecified: Secondary | ICD-10-CM | POA: Diagnosis present

## 2012-08-08 DIAGNOSIS — N400 Enlarged prostate without lower urinary tract symptoms: Secondary | ICD-10-CM | POA: Diagnosis present

## 2012-08-08 DIAGNOSIS — E876 Hypokalemia: Secondary | ICD-10-CM | POA: Diagnosis present

## 2012-08-08 DIAGNOSIS — Z48812 Encounter for surgical aftercare following surgery on the circulatory system: Secondary | ICD-10-CM

## 2012-08-08 DIAGNOSIS — Z8546 Personal history of malignant neoplasm of prostate: Secondary | ICD-10-CM

## 2012-08-08 DIAGNOSIS — Z7982 Long term (current) use of aspirin: Secondary | ICD-10-CM

## 2012-08-08 DIAGNOSIS — Z87891 Personal history of nicotine dependence: Secondary | ICD-10-CM

## 2012-08-08 DIAGNOSIS — I10 Essential (primary) hypertension: Secondary | ICD-10-CM | POA: Diagnosis present

## 2012-08-08 DIAGNOSIS — E039 Hypothyroidism, unspecified: Secondary | ICD-10-CM | POA: Diagnosis present

## 2012-08-08 DIAGNOSIS — I2510 Atherosclerotic heart disease of native coronary artery without angina pectoris: Secondary | ICD-10-CM | POA: Diagnosis present

## 2012-08-08 LAB — PROTIME-INR: INR: 1.18 (ref 0.00–1.49)

## 2012-08-08 LAB — BASIC METABOLIC PANEL
BUN: 11 mg/dL (ref 6–23)
CO2: 25 mEq/L (ref 19–32)
Chloride: 104 mEq/L (ref 96–112)
GFR calc non Af Amer: 81 mL/min — ABNORMAL LOW (ref >90)
Glucose, Bld: 131 mg/dL — ABNORMAL HIGH (ref 70–99)
Potassium: 3.6 mEq/L (ref 3.5–5.1)
Sodium: 139 mEq/L (ref 135–145)

## 2012-08-08 LAB — CBC
HCT: 36.9 % — ABNORMAL LOW (ref 39.0–52.0)
Hemoglobin: 13.1 g/dL (ref 13.0–17.0)
MCHC: 35.5 g/dL (ref 30.0–36.0)
RBC: 4.2 MIL/uL — ABNORMAL LOW (ref 4.22–5.81)

## 2012-08-08 LAB — APTT: aPTT: 32 seconds (ref 24–37)

## 2012-08-08 LAB — MAGNESIUM: Magnesium: 1.7 mg/dL (ref 1.5–2.5)

## 2012-08-08 SURGERY — INSERTION, ENDOVASCULAR STENT GRAFT, AORTA, ABDOMINAL
Anesthesia: General | Wound class: Clean

## 2012-08-08 MED ORDER — ROCURONIUM BROMIDE 100 MG/10ML IV SOLN
INTRAVENOUS | Status: DC | PRN
  Administered 2012-08-08: 50 mg via INTRAVENOUS
  Administered 2012-08-08: 10 mg via INTRAVENOUS

## 2012-08-08 MED ORDER — FLUTICASONE PROPIONATE 50 MCG/ACT NA SUSP
2.0000 | Freq: Every day | NASAL | Status: DC | PRN
  Filled 2012-08-08: qty 16

## 2012-08-08 MED ORDER — HEPARIN SODIUM (PORCINE) 1000 UNIT/ML IJ SOLN
INTRAMUSCULAR | Status: DC | PRN
  Administered 2012-08-08: 8000 [IU] via INTRAVENOUS

## 2012-08-08 MED ORDER — LACTATED RINGERS IV SOLN
INTRAVENOUS | Status: DC | PRN
  Administered 2012-08-08 (×2): via INTRAVENOUS

## 2012-08-08 MED ORDER — VITAMIN B-12 1000 MCG PO TABS
5000.0000 ug | ORAL_TABLET | Freq: Every day | ORAL | Status: DC
  Administered 2012-08-08: 5000 ug via ORAL
  Filled 2012-08-08 (×2): qty 5

## 2012-08-08 MED ORDER — DEXTROSE 5 % IV SOLN
1.5000 g | Freq: Two times a day (BID) | INTRAVENOUS | Status: AC
  Administered 2012-08-08 – 2012-08-09 (×2): 1.5 g via INTRAVENOUS
  Filled 2012-08-08 (×2): qty 1.5

## 2012-08-08 MED ORDER — ONDANSETRON HCL 4 MG/2ML IJ SOLN: 4.0000 mg | Freq: Four times a day (QID) | INTRAMUSCULAR | Status: DC | PRN

## 2012-08-08 MED ORDER — AMLODIPINE BESYLATE 5 MG PO TABS
5.0000 mg | ORAL_TABLET | Freq: Every day | ORAL | Status: DC
  Administered 2012-08-09: 5 mg via ORAL
  Filled 2012-08-08: qty 1

## 2012-08-08 MED ORDER — FENTANYL CITRATE 0.05 MG/ML IJ SOLN
INTRAMUSCULAR | Status: AC
  Filled 2012-08-08: qty 2

## 2012-08-08 MED ORDER — HYDRALAZINE HCL 20 MG/ML IJ SOLN: 10.0000 mg | INTRAMUSCULAR | Status: DC | PRN

## 2012-08-08 MED ORDER — OXYCODONE HCL 5 MG PO TABS: 5.0000 mg | ORAL_TABLET | ORAL | Status: DC | PRN

## 2012-08-08 MED ORDER — LIDOCAINE HCL (CARDIAC) 20 MG/ML IV SOLN
INTRAVENOUS | Status: DC | PRN
  Administered 2012-08-08: 100 mg via INTRAVENOUS

## 2012-08-08 MED ORDER — TAMSULOSIN HCL 0.4 MG PO CAPS
0.4000 mg | ORAL_CAPSULE | Freq: Every day | ORAL | Status: DC
  Administered 2012-08-08: 0.4 mg via ORAL
  Filled 2012-08-08 (×2): qty 1

## 2012-08-08 MED ORDER — SODIUM CHLORIDE 0.9 % IV SOLN
10.0000 mg | INTRAVENOUS | Status: DC | PRN
  Administered 2012-08-08: 20 ug/min via INTRAVENOUS

## 2012-08-08 MED ORDER — DILTIAZEM HCL ER COATED BEADS 240 MG PO CP24
240.0000 mg | ORAL_CAPSULE | Freq: Every day | ORAL | Status: DC
  Administered 2012-08-08: 240 mg via ORAL
  Filled 2012-08-08 (×2): qty 1

## 2012-08-08 MED ORDER — CLONAZEPAM 0.5 MG PO TABS
0.5000 mg | ORAL_TABLET | Freq: Every day | ORAL | Status: DC
  Administered 2012-08-08: 0.5 mg via ORAL
  Filled 2012-08-08: qty 1

## 2012-08-08 MED ORDER — IRBESARTAN 300 MG PO TABS
300.0000 mg | ORAL_TABLET | Freq: Every day | ORAL | Status: DC
  Administered 2012-08-08 – 2012-08-09 (×2): 300 mg via ORAL
  Filled 2012-08-08 (×2): qty 1

## 2012-08-08 MED ORDER — HYDROMORPHONE HCL PF 1 MG/ML IJ SOLN: 0.2500 mg | INTRAMUSCULAR | Status: DC | PRN

## 2012-08-08 MED ORDER — PNEUMOCOCCAL VAC POLYVALENT 25 MCG/0.5ML IJ INJ
0.5000 mL | INJECTION | INTRAMUSCULAR | Status: AC
  Administered 2012-08-09: 0.5 mL via INTRAMUSCULAR
  Filled 2012-08-08: qty 0.5

## 2012-08-08 MED ORDER — 0.9 % SODIUM CHLORIDE (POUR BTL) OPTIME
TOPICAL | Status: DC | PRN
  Administered 2012-08-08: 1000 mL

## 2012-08-08 MED ORDER — SODIUM CHLORIDE 0.9 % IV SOLN
INTRAVENOUS | Status: DC
  Administered 2012-08-08: 10:00:00 via INTRAVENOUS

## 2012-08-08 MED ORDER — PROTAMINE SULFATE 10 MG/ML IV SOLN
INTRAVENOUS | Status: DC | PRN
  Administered 2012-08-08: 50 mg via INTRAVENOUS

## 2012-08-08 MED ORDER — ONDANSETRON HCL 4 MG/2ML IJ SOLN
INTRAMUSCULAR | Status: DC | PRN
  Administered 2012-08-08: 4 mg via INTRAVENOUS

## 2012-08-08 MED ORDER — OLMESARTAN MEDOXOMIL-HCTZ 40-25 MG PO TABS: 1.0000 | ORAL_TABLET | Freq: Every day | ORAL | Status: DC

## 2012-08-08 MED ORDER — MORPHINE SULFATE 2 MG/ML IJ SOLN: 2.0000 mg | INTRAMUSCULAR | Status: DC | PRN

## 2012-08-08 MED ORDER — ALLOPURINOL 300 MG PO TABS
300.0000 mg | ORAL_TABLET | Freq: Every day | ORAL | Status: DC
  Administered 2012-08-08: 300 mg via ORAL
  Filled 2012-08-08 (×2): qty 1

## 2012-08-08 MED ORDER — INFLUENZA VIRUS VACC SPLIT PF IM SUSP
0.5000 mL | INTRAMUSCULAR | Status: AC
Start: 2012-08-09 — End: 2012-08-09
  Administered 2012-08-09: 0.5 mL via INTRAMUSCULAR
  Filled 2012-08-08: qty 0.5

## 2012-08-08 MED ORDER — MIDAZOLAM HCL 2 MG/2ML IJ SOLN
INTRAMUSCULAR | Status: AC
  Filled 2012-08-08: qty 2

## 2012-08-08 MED ORDER — NEOSTIGMINE METHYLSULFATE 1 MG/ML IJ SOLN
INTRAMUSCULAR | Status: DC | PRN
  Administered 2012-08-08: 4 mg via INTRAVENOUS

## 2012-08-08 MED ORDER — LABETALOL HCL 5 MG/ML IV SOLN: 10.0000 mg | INTRAVENOUS | Status: DC | PRN

## 2012-08-08 MED ORDER — IODIXANOL 320 MG/ML IV SOLN
INTRAVENOUS | Status: DC | PRN
  Administered 2012-08-08: 150 mL via INTRA_ARTERIAL

## 2012-08-08 MED ORDER — ALUM & MAG HYDROXIDE-SIMETH 200-200-20 MG/5ML PO SUSP: 15.0000 mL | ORAL | Status: DC | PRN

## 2012-08-08 MED ORDER — MAGNESIUM SULFATE 40 MG/ML IJ SOLN
2.0000 g | Freq: Once | INTRAMUSCULAR | Status: AC | PRN
  Filled 2012-08-08: qty 50

## 2012-08-08 MED ORDER — DOPAMINE-DEXTROSE 3.2-5 MG/ML-% IV SOLN: 3.0000 ug/kg/min | INTRAVENOUS | Status: DC

## 2012-08-08 MED ORDER — OXYCODONE HCL 5 MG PO TABS: 5.0000 mg | ORAL_TABLET | Freq: Four times a day (QID) | ORAL | Status: DC | PRN

## 2012-08-08 MED ORDER — LEVOTHYROXINE SODIUM 150 MCG PO TABS
150.0000 ug | ORAL_TABLET | Freq: Every day | ORAL | Status: DC
  Administered 2012-08-09: 150 ug via ORAL
  Filled 2012-08-08 (×2): qty 1

## 2012-08-08 MED ORDER — FAMOTIDINE 20 MG PO TABS
20.0000 mg | ORAL_TABLET | Freq: Every day | ORAL | Status: DC
  Administered 2012-08-08: 20 mg via ORAL
  Filled 2012-08-08 (×2): qty 1

## 2012-08-08 MED ORDER — PHENOL 1.4 % MT LIQD: 1.0000 | OROMUCOSAL | Status: DC | PRN

## 2012-08-08 MED ORDER — FENTANYL CITRATE 0.05 MG/ML IJ SOLN
INTRAMUSCULAR | Status: DC | PRN
  Administered 2012-08-08 (×2): 100 ug via INTRAVENOUS
  Administered 2012-08-08: 50 ug via INTRAVENOUS

## 2012-08-08 MED ORDER — ASPIRIN EC 81 MG PO TBEC
81.0000 mg | DELAYED_RELEASE_TABLET | Freq: Every day | ORAL | Status: DC
  Administered 2012-08-08: 81 mg via ORAL
  Filled 2012-08-08 (×2): qty 1

## 2012-08-08 MED ORDER — PSEUDOEPHEDRINE HCL ER 120 MG PO TB12
120.0000 mg | ORAL_TABLET | Freq: Two times a day (BID) | ORAL | Status: DC
  Administered 2012-08-08 (×2): 120 mg via ORAL
  Filled 2012-08-08 (×4): qty 1

## 2012-08-08 MED ORDER — NIACIN ER (ANTIHYPERLIPIDEMIC) 1000 MG PO TBCR
1000.0000 mg | EXTENDED_RELEASE_TABLET | Freq: Every day | ORAL | Status: DC
  Administered 2012-08-08: 1000 mg via ORAL
  Filled 2012-08-08 (×3): qty 1

## 2012-08-08 MED ORDER — HYDROCHLOROTHIAZIDE 25 MG PO TABS
25.0000 mg | ORAL_TABLET | Freq: Every day | ORAL | Status: DC
  Administered 2012-08-08 – 2012-08-09 (×2): 25 mg via ORAL
  Filled 2012-08-08 (×2): qty 1

## 2012-08-08 MED ORDER — EPHEDRINE SULFATE 50 MG/ML IJ SOLN
INTRAMUSCULAR | Status: DC | PRN
  Administered 2012-08-08: 5 mg via INTRAVENOUS

## 2012-08-08 MED ORDER — DOCUSATE SODIUM 100 MG PO CAPS
100.0000 mg | ORAL_CAPSULE | Freq: Every day | ORAL | Status: DC
  Administered 2012-08-09: 100 mg via ORAL
  Filled 2012-08-08: qty 1

## 2012-08-08 MED ORDER — ATORVASTATIN CALCIUM 10 MG PO TABS
10.0000 mg | ORAL_TABLET | Freq: Every day | ORAL | Status: DC
  Administered 2012-08-08: 10 mg via ORAL
  Filled 2012-08-08 (×2): qty 1

## 2012-08-08 MED ORDER — OMEGA-3-ACID ETHYL ESTERS 1 G PO CAPS
1.0000 g | ORAL_CAPSULE | Freq: Every day | ORAL | Status: DC
  Administered 2012-08-09: 1 g via ORAL
  Filled 2012-08-08 (×2): qty 1

## 2012-08-08 MED ORDER — PROPOFOL 10 MG/ML IV BOLUS
INTRAVENOUS | Status: DC | PRN
  Administered 2012-08-08: 200 mg via INTRAVENOUS

## 2012-08-08 MED ORDER — GUAIFENESIN-DM 100-10 MG/5ML PO SYRP: 15.0000 mL | ORAL_SOLUTION | ORAL | Status: DC | PRN

## 2012-08-08 MED ORDER — LORATADINE-PSEUDOEPHEDRINE ER 10-240 MG PO TB24: 1.0000 | ORAL_TABLET | Freq: Every day | ORAL | Status: DC

## 2012-08-08 MED ORDER — ACETAMINOPHEN 650 MG RE SUPP: 325.0000 mg | RECTAL | Status: DC | PRN

## 2012-08-08 MED ORDER — PANTOPRAZOLE SODIUM 40 MG PO TBEC: 40.0000 mg | DELAYED_RELEASE_TABLET | Freq: Every day | ORAL | Status: DC

## 2012-08-08 MED ORDER — ACETAMINOPHEN 325 MG PO TABS
650.0000 mg | ORAL_TABLET | Freq: Two times a day (BID) | ORAL | Status: DC | PRN
  Administered 2012-08-08 – 2012-08-09 (×2): 650 mg via ORAL
  Filled 2012-08-08 (×2): qty 2

## 2012-08-08 MED ORDER — POTASSIUM CHLORIDE CRYS ER 20 MEQ PO TBCR: 20.0000 meq | EXTENDED_RELEASE_TABLET | Freq: Once | ORAL | Status: AC | PRN

## 2012-08-08 MED ORDER — ACETAMINOPHEN ER 650 MG PO TBCR: 650.0000 mg | EXTENDED_RELEASE_TABLET | Freq: Two times a day (BID) | ORAL | Status: DC | PRN

## 2012-08-08 MED ORDER — GLYCOPYRROLATE 0.2 MG/ML IJ SOLN
INTRAMUSCULAR | Status: DC | PRN
  Administered 2012-08-08: 0.2 mg via INTRAVENOUS
  Administered 2012-08-08: .6 mg via INTRAVENOUS

## 2012-08-08 MED ORDER — LORATADINE 10 MG PO TABS
10.0000 mg | ORAL_TABLET | Freq: Every day | ORAL | Status: DC
  Administered 2012-08-08 – 2012-08-09 (×2): 10 mg via ORAL
  Filled 2012-08-08 (×2): qty 1

## 2012-08-08 MED ORDER — SODIUM CHLORIDE 0.9 % IR SOLN
Status: DC | PRN
  Administered 2012-08-08: 09:00:00

## 2012-08-08 MED ORDER — TEMAZEPAM 15 MG PO CAPS: 15.0000 mg | ORAL_CAPSULE | Freq: Every evening | ORAL | Status: DC | PRN

## 2012-08-08 MED ORDER — METOPROLOL SUCCINATE ER 100 MG PO TB24
100.0000 mg | ORAL_TABLET | Freq: Every day | ORAL | Status: DC
  Administered 2012-08-09: 100 mg via ORAL
  Filled 2012-08-08: qty 1

## 2012-08-08 MED ORDER — SODIUM CHLORIDE 0.9 % IV SOLN: 500.0000 mL | Freq: Once | INTRAVENOUS | Status: AC | PRN

## 2012-08-08 MED ORDER — MIDAZOLAM HCL 5 MG/5ML IJ SOLN
INTRAMUSCULAR | Status: DC | PRN
  Administered 2012-08-08 (×2): 2 mg via INTRAVENOUS

## 2012-08-08 SURGICAL SUPPLY — 83 items
BAG BANDED W/RUBBER/TAPE 36X54 (MISCELLANEOUS) ×2 IMPLANT
BAG DECANTER FOR FLEXI CONT (MISCELLANEOUS) IMPLANT
BAG SNAP BAND KOVER 36X36 (MISCELLANEOUS) ×8 IMPLANT
BALLN CODA OCL 2-9.0-35-120-3 (BALLOONS)
BALLOON COD OCL 2-9.0-35-120-3 (BALLOONS) IMPLANT
CANISTER SUCTION 2500CC (MISCELLANEOUS) ×2 IMPLANT
CATH BEACON 5.038 65CM KMP-01 (CATHETERS) ×2 IMPLANT
CATH OMNI FLUSH .035X70CM (CATHETERS) ×2 IMPLANT
CLIP TI MEDIUM 24 (CLIP) IMPLANT
CLIP TI WIDE RED SMALL 24 (CLIP) IMPLANT
CLOTH BEACON ORANGE TIMEOUT ST (SAFETY) ×2 IMPLANT
COVER DOME SNAP 22 D (MISCELLANEOUS) ×2 IMPLANT
COVER MAYO STAND STRL (DRAPES) ×2 IMPLANT
COVER PROBE W GEL 5X96 (DRAPES) ×2 IMPLANT
COVER SURGICAL LIGHT HANDLE (MISCELLANEOUS) ×2 IMPLANT
DERMABOND ADVANCED (GAUZE/BANDAGES/DRESSINGS) ×2
DERMABOND ADVANCED .7 DNX12 (GAUZE/BANDAGES/DRESSINGS) ×2 IMPLANT
DEVICE CLOSURE PERCLS PRGLD 6F (VASCULAR PRODUCTS) ×4 IMPLANT
DEVICE TORQUE 50000 (MISCELLANEOUS) IMPLANT
DRAIN CHANNEL 10F 3/8 F FF (DRAIN) IMPLANT
DRAIN CHANNEL 10M FLAT 3/4 FLT (DRAIN) IMPLANT
DRAPE TABLE COVER HEAVY DUTY (DRAPES) ×2 IMPLANT
DRESSING OPSITE X SMALL 2X3 (GAUZE/BANDAGES/DRESSINGS) ×4 IMPLANT
DRYSEAL FLEXSHEATH 12FR 33CM (SHEATH) ×1
DRYSEAL FLEXSHEATH 18FR 33CM (SHEATH) ×1
ELECT REM PT RETURN 9FT ADLT (ELECTROSURGICAL) ×4
ELECTRODE REM PT RTRN 9FT ADLT (ELECTROSURGICAL) ×2 IMPLANT
EVACUATOR 3/16  PVC DRAIN (DRAIN)
EVACUATOR 3/16 PVC DRAIN (DRAIN) IMPLANT
EVACUATOR SILICONE 100CC (DRAIN) IMPLANT
EXCLUDER TRUNK (Endovascular Graft) ×2 IMPLANT
GAUZE SPONGE 2X2 8PLY STRL LF (GAUZE/BANDAGES/DRESSINGS) ×2 IMPLANT
GLOVE BIO SURGEON STRL SZ 6.5 (GLOVE) ×4 IMPLANT
GLOVE BIOGEL PI IND STRL 6.5 (GLOVE) ×2 IMPLANT
GLOVE BIOGEL PI IND STRL 7.5 (GLOVE) ×1 IMPLANT
GLOVE BIOGEL PI INDICATOR 6.5 (GLOVE) ×2
GLOVE BIOGEL PI INDICATOR 7.5 (GLOVE) ×1
GLOVE SURG SS PI 7.0 STRL IVOR (GLOVE) ×2 IMPLANT
GLOVE SURG SS PI 7.5 STRL IVOR (GLOVE) ×2 IMPLANT
GOWN PREVENTION PLUS XLARGE (GOWN DISPOSABLE) ×2 IMPLANT
GOWN PREVENTION PLUS XXLARGE (GOWN DISPOSABLE) ×2 IMPLANT
GOWN STRL NON-REIN LRG LVL3 (GOWN DISPOSABLE) ×2 IMPLANT
GRAFT BALLN CATH 65CM (STENTS) ×1 IMPLANT
GRAFT EXCLUDER LEG 16X12 (Endovascular Graft) ×2 IMPLANT
HEMOSTAT SNOW SURGICEL 2X4 (HEMOSTASIS) IMPLANT
HEMOSTAT SURGICEL 2X14 (HEMOSTASIS) IMPLANT
KIT BASIN OR (CUSTOM PROCEDURE TRAY) ×2 IMPLANT
KIT ROOM TURNOVER OR (KITS) ×2 IMPLANT
LEG CONTRALATERAL 16X18X13.5 (Endovascular Graft) ×2 IMPLANT
NAMIC PROTECTION STATION ×2 IMPLANT
NEEDLE PERC 18GX7CM (NEEDLE) ×4 IMPLANT
NS IRRIG 1000ML POUR BTL (IV SOLUTION) ×2 IMPLANT
PACK AORTA (CUSTOM PROCEDURE TRAY) ×2 IMPLANT
PAD ARMBOARD 7.5X6 YLW CONV (MISCELLANEOUS) ×4 IMPLANT
PENCIL BUTTON HOLSTER BLD 10FT (ELECTRODE) ×2 IMPLANT
PERCLOSE PROGLIDE 6F (VASCULAR PRODUCTS) ×8
SHEATH AVANTI 11CM 8FR (MISCELLANEOUS) ×2 IMPLANT
SHEATH BRITE TIP 8FR 23CM (MISCELLANEOUS) ×2 IMPLANT
SHEATH DRYSEAL FLEX 12FR 33CM (SHEATH) ×1 IMPLANT
SHEATH DRYSEAL FLEX 18FR 33CM (SHEATH) ×1 IMPLANT
SPONGE GAUZE 2X2 STER 10/PKG (GAUZE/BANDAGES/DRESSINGS) ×2
STAPLER VISISTAT 35W (STAPLE) IMPLANT
STENT GRAFT BALLN CATH 65CM (STENTS) ×1
STOPCOCK MORSE 400PSI 3WAY (MISCELLANEOUS) ×2 IMPLANT
SUT ETHILON 3 0 PS 1 (SUTURE) IMPLANT
SUT PROLENE 5 0 C 1 24 (SUTURE) IMPLANT
SUT VIC AB 2-0 CT1 36 (SUTURE) IMPLANT
SUT VIC AB 3-0 SH 27 (SUTURE)
SUT VIC AB 3-0 SH 27X BRD (SUTURE) IMPLANT
SUT VICRYL 4-0 PS2 18IN ABS (SUTURE) ×4 IMPLANT
SYR 20CC LL (SYRINGE) ×4 IMPLANT
SYR 30ML LL (SYRINGE) IMPLANT
SYR 5ML LL (SYRINGE) IMPLANT
SYR MEDRAD MARK V 150ML (SYRINGE) ×2 IMPLANT
SYRINGE 10CC LL (SYRINGE) ×6 IMPLANT
TOWEL OR 17X24 6PK STRL BLUE (TOWEL DISPOSABLE) ×4 IMPLANT
TOWEL OR 17X26 10 PK STRL BLUE (TOWEL DISPOSABLE) ×4 IMPLANT
TRAY FOLEY CATH 14FRSI W/METER (CATHETERS) ×2 IMPLANT
TUBING HIGH PRESSURE 120CM (CONNECTOR) ×4 IMPLANT
WATER STERILE IRR 1000ML POUR (IV SOLUTION) IMPLANT
WIRE AMPLATZ SS-J .035X180CM (WIRE) ×4 IMPLANT
WIRE BENTSON .035X145CM (WIRE) ×4 IMPLANT
WIRE LUNDERQUIST .035X180CM (WIRE) ×2 IMPLANT

## 2012-08-08 NOTE — Addendum Note (Signed)
Addendum  created 08/08/12 1144 by Broadus John, CRNA   Modules edited:Anesthesia Flowsheet

## 2012-08-08 NOTE — H&P (View-Only) (Signed)
[facility] [place]   Patient name: Garrett NICKLIN Sr. MRN: [id number] DOB: 09/29/1941 Sex: male     Chief Complaint  Patient presents with  . AAA    f/u to discuss repair    HISTORY OF PRESENT ILLNESS: The patient is back today for further followup of his abdominal aortic aneurysm. He was scheduled to have this repaired in may. He underwent preoperative coil embolization of his right hypogastric artery. He then developed right buttock claudication. This did improve and therefore we elected to delay his operation to give him a chance to collateralize better to his pelvis. Several weeks ago he had an episode of blood in his stool. He was sent for a CT scan to rule out any potential vascular cause. He also saw a gastroenterologist. This was thought to be secondary to hemorrhoids or a polyp. He did not receive endoscopy.  The patient states that his buttock claudication has nearly resolved. He is able to play tennis without any significant limitations. He denies any abdominal pain or claudication symptoms.  The patient has a history of myocardial infarction. He is undergone coronary artery bypass grafting. He does not have chest pain currently. He is maintained only on aspirin and beta blocker.Marland Kitchen He is medically managed for his hypertension and hypercholesterolemia appear  Past Medical History  Diagnosis Date  . AAA (abdominal aortic aneurysm)   . CAD (coronary artery disease)   . Hypertension   . Atrial fibrillation   . Myocardial infarction     sp inferior  . Hyperlipidemia   . Cancer     prostate  . Thyroid disease     hypothyroidism  . Arthritis   . Reflux   . Gout   . Nerve compression     right leg    Past Surgical History  Procedure Date  . Coronary artery bypass graft   . Parathyroid adenoma   . Spine surgery   . Kidney stone surgery   . Tonsillectomy   . Prostate surgery     rad seeds  . Coronary stent placement 2009    RCA  . Abdominal  aortagram embolization 02/28/2012    History   Social History  . Marital Status: Divorced    Spouse Name: N/A    Number of Children: N/A  . Years of Education: N/A   Occupational History  . Not on file.   Social History Main Topics  . Smoking status: Former Smoker    Types: Cigarettes    Quit date: 08/01/1970  . Smokeless tobacco: Never Used  . Alcohol Use: Yes     1-3 glasses 3-4 times a week  . Drug Use: No  . Sexually Active: Not on file   Other Topics Concern  . Not on file   Social History Narrative  . No narrative on file    Family History  Problem Relation Age of Onset  . Heart disease Father   . Heart attack Father   . Stroke Mother   . Heart disease Sister   . Heart disease Paternal Uncle   . Diabetes Maternal Grandmother     Allergies as of 07/22/2012 - Review Complete 07/22/2012  Allergen Reaction Noted  . Ace inhibitors Cough 07/03/2011  . Lopressor (metoprolol tartrate) Cough 07/03/2011  . Nitroglycerin Other (See Comments) 08/21/2011    Current Outpatient Prescriptions on File Prior to Visit  Medication Sig Dispense Refill  . acetaminophen (TYLENOL) 650 MG CR tablet Take 650 mg by mouth  every 8 (eight) hours as needed. For pain      . allopurinol (ZYLOPRIM) 300 MG tablet Take 300 mg by mouth at bedtime.        Marland Kitchen amLODipine (NORVASC) 5 MG tablet Take 5 mg by mouth daily.        Marland Kitchen aspirin 81 MG tablet Take 81 mg by mouth daily.        . clonazePAM (KLONOPIN) 1 MG tablet Take 0.5 mg by mouth at bedtime.       . Cyanocobalamin (VITAMIN B-12 CR PO) Take 5,000 mcg by mouth daily.       Marland Kitchen diltiazem (CARDIZEM CD) 240 MG 24 hr capsule Take 240 mg by mouth daily.        . famotidine (PEPCID) 20 MG tablet Take 20 mg by mouth daily.       . fish oil-omega-3 fatty acids 1000 MG capsule Take 1,200 mg by mouth 2 (two) times daily.       . fluticasone (FLONASE) 50 MCG/ACT nasal spray Place 2 sprays into the nose daily as needed. Nasal allergies      .  levothyroxine (SYNTHROID, LEVOTHROID) 150 MCG tablet Take 150 mcg by mouth daily.        Marland Kitchen loratadine-pseudoephedrine (CLARITIN-D 24-HOUR) 10-240 MG per 24 hr tablet Take 1 tablet by mouth daily.        . metoprolol (TOPROL-XL) 100 MG 24 hr tablet Take 100 mg by mouth daily.        . Multiple Vitamin (MULTIVITAMIN PO) Take 1 tablet by mouth daily.        . niacin (NIASPAN) 1000 MG CR tablet Take 1,000 mg by mouth at bedtime.        Marland Kitchen olmesartan-hydrochlorothiazide (BENICAR HCT) 40-25 MG per tablet Take 1 tablet by mouth daily.        . rosuvastatin (CRESTOR) 10 MG tablet Take 10 mg by mouth daily.        . Tamsulosin HCl (FLOMAX) 0.4 MG CAPS Take 0.4 mg by mouth. One q other day       . diclofenac (VOLTAREN) 75 MG EC tablet Take 75 mg by mouth daily.           REVIEW OF SYSTEMS: For review of systems was done by the patient and the encounter form. All were negative.  PHYSICAL EXAMINATION:   Vital signs are BP 150/92  Pulse 58  Resp 16  Ht 5\' 10"  (1.778 m)  Wt 208 lb (94.348 kg)  BMI 29.84 kg/m2  SpO2 100% General: The patient appears their stated age. HEENT:  No gross abnormalities Pulmonary:  Non labored breathing Abdomen: Soft and non-tender. Aorta is nontender. Musculoskeletal: There are no major deformities. Neurologic: No focal weakness or paresthesias are detected, Skin: There are no ulcer or rashes noted. Psychiatric: The patient has normal affect. Cardiovascular: There is a regular rate and rhythm without significant murmur appreciated. Palpable femoral pulses. No carotid bruits   Diagnostic Studies The patient's carotid ultrasound was reviewed this shows minimal carotid disease bilaterally. Ankle-brachial indices are greater than 1 and triphasic  I have reviewed his recent CT scan this shows that the aneurysm has increased in size to 5.4 cm. The inferior mesenteric artery is patent  Assessment: Abdominal aortic aneurysm Plan: The patient's aneurysm continues to  increase in size. We are at the point where I would recommend proceeding with repair. Because of his history of buttock claudication as well as the presence of a large IMA on  imaging studies we discussed extensively our options of endovascular versus open repair. We have decided to proceed with endovascular repair. We discussed extensively the risks which include potentially exacerbating his buttock claudication. There is also the possibility of colon ischemia. All his questions were answered today. We'll schedule his operation for Thursday, September 26. I will also discuss this with Dr. Wynonia Lawman, his cardiologist.  Eldridge Abrahams, M.D. [facility] [place] Office: [phone] Pager:  [phone]

## 2012-08-08 NOTE — Preoperative (Signed)
Beta Blockers   Reason not to administer Beta Blockers:Not Applicable 

## 2012-08-08 NOTE — Progress Notes (Signed)
Utilization review completed.  

## 2012-08-08 NOTE — Anesthesia Postprocedure Evaluation (Signed)
  Anesthesia Post-op Note  Patient: Garrett HARROWER Sr.  Procedure(s) Performed: Procedure(s) (LRB) with comments: ABDOMINAL AORTIC ENDOVASCULAR STENT GRAFT (N/A) - EVAR- Gore  Patient Location: PACU  Anesthesia Type: General  Level of Consciousness: awake  Airway and Oxygen Therapy: Patient Spontanous Breathing  Post-op Pain: mild  Post-op Assessment: Post-op Vital signs reviewed  Post-op Vital Signs: Reviewed  Complications: No apparent anesthesia complications

## 2012-08-08 NOTE — Discharge Summary (Signed)
[facility] Discharge Summary  Garrett Jordan Sr. Jan 01, 1941 71 y.o. male  [id number]  Admission Date: 08/08/2012  Discharge Date: 08/09/12  Physician: Serafina Mitchell, MD  Admission Diagnosis: Abdominal Aortic Aneurysm   HPI:   This is a 71 y.o. male who is back today for further followup of his abdominal aortic aneurysm. He was scheduled to have this repaired in may. He underwent preoperative coil embolization of his right hypogastric artery. He then developed right buttock claudication. This did improve and therefore we elected to delay his operation to give him a chance to collateralize better to his pelvis. Several weeks ago he had an episode of blood in his stool. He was sent for a CT scan to rule out any potential vascular cause. He also saw a gastroenterologist. This was thought to be secondary to hemorrhoids or a polyp. He did not receive endoscopy.  The patient states that his buttock claudication has nearly resolved. He is able to play tennis without any significant limitations. He denies any abdominal pain or claudication symptoms.  The patient has a history of myocardial infarction. He is undergone coronary artery bypass grafting. He does not have chest pain currently. He is maintained only on aspirin and beta blocker.Marland Kitchen He is medically managed for his hypertension and hypercholesterolemia appear   Hospital Course:  The patient was admitted to the hospital and taken to the operating room on 08/08/2012 and underwent EVAR.  The pt tolerated the procedure well and was transported to the PACU in good condition. By POD 1, he was doing well.  He did have hypokalemia with a K+ of 3.1 and this was supplemented.    The remainder of the hospital course consisted of increasing mobilization and increasing intake of solids without difficulty.  Labs: CBC    Component Value Date/Time   WBC 6.8 08/09/2012 0400   RBC 4.11* 08/09/2012 0400   HGB 13.1 08/09/2012 0400   HCT  35.8* 08/09/2012 0400   PLT 134* 08/09/2012 0400   MCV 87.1 08/09/2012 0400   MCH 31.9 08/09/2012 0400   MCHC 36.6* 08/09/2012 0400   RDW 13.1 08/09/2012 0400   LYMPHSABS 1.0 04/26/2009 2233   MONOABS 0.4 04/26/2009 2233   EOSABS 0.1 04/26/2009 2233   BASOSABS 0.0 04/26/2009 2233    BMET    Component Value Date/Time   NA 138 08/09/2012 0400   K 3.1* 08/09/2012 0400   CL 101 08/09/2012 0400   CO2 26 08/09/2012 0400   GLUCOSE 127* 08/09/2012 0400   BUN 10 08/09/2012 0400   CREATININE 0.90 08/09/2012 0400   CREATININE 1.02 02/15/2012 1121   CALCIUM 10.1 08/09/2012 0400   GFRNONAA 84* 08/09/2012 0400   GFRAA >90 08/09/2012 0400      Discharge Instructions:   The patient is discharged to home with extensive instructions on wound care and progressive ambulation.  They are instructed not to drive or perform any heavy lifting until returning to see the physician in his office.  Discharge Orders    Future Orders Please Complete By Expires   Resume previous diet      Driving Restrictions      Comments:   No driving for 2 weeks   Lifting restrictions      Comments:   No lifting for 6 weeks   Call MD for:  temperature >100.5      Call MD for:  redness, tenderness, or signs of infection (pain, swelling, bleeding, redness, odor or green/yellow discharge around  incision site)      Call MD for:  severe or increased pain, loss or decreased feeling  in affected limb(s)      ABDOMINAL PROCEDURE/ANEURYSM REPAIR/AORTO-BIFEMORAL BYPASS:  Call MD for increased abdominal pain; cramping diarrhea; nausea/vomiting      Discharge wound care:      Comments:   Shower daily with soap and water starting 08/10/12      Discharge Diagnosis:  Abdominal Aortic Aneurysm  Secondary Diagnosis: Patient Active Problem List  Diagnosis  . Abdominal aneurysm without mention of rupture  . CAD (coronary artery disease)  . Hypertensive heart disease without CHF  . Hyperlipidemia  . S/P CABG (coronary artery bypass graft)  .  Personal history of prostate cancer  . Hypothyroidism  . BPH (benign prostatic hypertrophy)  . Gout  . Lumbar disc disease  . History of kidney stones  . History of hyperparathyroidism  . Gallstones   Past Medical History  Diagnosis Date  . AAA (abdominal aortic aneurysm)   . CAD (coronary artery disease)   . Hypertension   . Atrial fibrillation   . Hyperlipidemia   . Cancer     prostate  . Thyroid disease     hypothyroidism  . Arthritis   . Reflux   . Gout   . Nerve compression     right leg  . Myocardial infarction 01/1991    sp inferior  . Hypothyroidism   . Seasonal allergies   . GERD (gastroesophageal reflux disease)       Claud, Carmen Medication Instructions [id number]   Printed on:08/08/12 0945  Medication Information                    levothyroxine (SYNTHROID, LEVOTHROID) 150 MCG tablet Take 150 mcg by mouth daily.            metoprolol (TOPROL-XL) 100 MG 24 hr tablet Take 100 mg by mouth daily.            olmesartan-hydrochlorothiazide (BENICAR HCT) 40-25 MG per tablet Take 1 tablet by mouth daily.            amLODipine (NORVASC) 5 MG tablet Take 5 mg by mouth daily.            rosuvastatin (CRESTOR) 10 MG tablet Take 10 mg by mouth daily at 6 PM.            niacin (NIASPAN) 1000 MG CR tablet Take 1,000 mg by mouth daily at 6 PM.            diltiazem (CARDIZEM CD) 240 MG 24 hr capsule Take 240 mg by mouth daily at 6 PM.            Tamsulosin HCl (FLOMAX) 0.4 MG CAPS Take 0.4 mg by mouth daily at 6 PM. One q other day           famotidine (PEPCID) 20 MG tablet Take 20 mg by mouth daily at 6 PM.            Multiple Vitamin (MULTIVITAMIN PO) Take 1 tablet by mouth daily.            loratadine-pseudoephedrine (CLARITIN-D 24-HOUR) 10-240 MG per 24 hr tablet Take 1 tablet by mouth daily.            clonazePAM (KLONOPIN) 1 MG tablet Take 0.5 mg by mouth at bedtime.            allopurinol (ZYLOPRIM) 300 MG tablet Take  300 mg  by mouth daily at 6 PM.            fluticasone (FLONASE) 50 MCG/ACT nasal spray Place 2 sprays into the nose daily as needed. Nasal allergies           acetaminophen (TYLENOL) 650 MG CR tablet Take 650 mg by mouth 2 (two) times daily as needed. For pain           Omega-3 Fatty Acids (FISH OIL) 1200 MG CAPS Take 1,200 mg by mouth 2 (two) times daily.           aspirin EC 81 MG tablet Take 81 mg by mouth daily at 6 PM.           Menthol-Methyl Salicylate (BENGAY ARTHRITIS FORMULA EX) Apply 1 application topically daily as needed. Apply to shoulders, knees, elbows, and ankles for pain           vitamin B-12 (CYANOCOBALAMIN) 1000 MCG tablet Take 5,000 mcg by mouth daily at 6 PM.           oxyCODONE (ROXICODONE) 5 MG immediate release tablet Take 1 tablet (5 mg total) by mouth every 6 (six) hours as needed for pain. #30 NR            Disposition: home   Patient's condition: is Good  Follow up: 1. Dr. Trula Slade in 4 weeks with CTA   Leontine Locket, PA-C [facility] [phone] 08/08/2012  9:45 AM

## 2012-08-08 NOTE — OR Nursing (Signed)
Fluoro time: 14.3 minutes; 6227 mGy

## 2012-08-08 NOTE — Op Note (Signed)
[facility] [place]  Patient name: Garrett GREGER Sr. MRN: [id number] DOB: 20-May-1941 Sex: male  08/08/2012 Pre-operative Diagnosis: Abdominal aortic aneurysm Post-operative diagnosis:  Same Surgeon:  Eldridge Abrahams Assistants:  Leontine Locket Procedure:   #1: Bilateral ultrasound-guided percutaneous access   #2: Catheter in aorta x2   #3: Abdominal aortogram   #4: Endovascular repair of abdominal aortic aneurysm   #5: Distal extension x1 Anesthesia:  Gen. Blood Loss:  See anesthesia record Specimens:  None  Findings:  Complete exclusion Devices used: Main body: Primary right Gore Excluder 28 x 12 x 18. Ipsilateral extension (right) Gore Excluder 12 x 7. Contralateral left Gore Excluder 18 x 13.5  Indications:  The patient is previously undergone coil embolization of right hypogastric artery in anticipation of coverage for a hypogastric aneurysm. He did have buttock claudication and therefore we have delayed his procedure. His symptoms have nearly resolved. He comes in today for exclusion of his aneurysm. I did discuss the possibility of an open repair with reimplantation of his inferior mesenteric artery, however the patient wishes to proceed with endovascular repair, understanding the risk of worsening claudication in the buttocks region.  Procedure:  The patient was identified in the holding area and taken to [facility]  The patient was then placed supine on the table. general anesthesia was administered.  The patient was prepped and draped in the usual sterile fashion.  A time out was called and antibiotics were administered.  Ultrasound was used to identify the right and left common femoral artery. Both of which were without significant disease and widely patent. Digital ultrasound images were acquired. An 11 blade was used to make a skin nick bilaterally. Each common femoral artery was accessed under ultrasound guidance with an 18-gauge needle. An 035 wire  was advanced. The subcutaneous tract was dilated with a 8 [place] dilator. Pro-glide devices were placed at the 11:00 and 1:00 position bilaterally for pre-closure. The patient was fully heparinized. Next over a Lunderquist wire on the left and a Amplatz wire on the right a 12 French sheath was placed on the left and a 18 French sheath on the right. The Omni flush catheter was positioned at the level of L1 and an abdominal aortogram was performed which delineated the origin of bilateral renal arteries. The main body was then prepared on the back table. This was a Dealer 28 x 12 x 18. This was advanced through the right side and positioned appropriately and deployed. The contralateral gate was then cannulated with a Kumpe catheter and a Benson wire. The Omni flush catheter was able to be freely rotated within the main portion of the device, confirming successful cannulation. The image detected was then positioned in a right anterior oblique position. A retrograde sheath injection was performed which delineated the location of the left hypogastric artery. The contralateral limb was then prepared on the back table. This was a Dealer 18 x 13.5. It was advanced through the sheath which had been advanced into the contralateral gate with the dilator. In order to better take the curve and tortuosity of the left iliac system I withdrew the Lunderquist wire and performed another retrograde arteriogram. I deployed the device off of this arteriogram. The distal portion of the left limb landed just proximal to the hypogastric artery origin. I then completed the deployment of the ipsilateral limb. With the image detected and a left anterior oblique position, a retrograde sheath injection was performed.  I then selected a Gore Excluder 12 x 7 48 distal right leg extension. This was appropriately deployed into the external iliac artery. Next a Q-50 was used to mold the top portion of the device as well as the distal  portion of the iliac limbs and the device overlap. A completion arteriogram was then performed. This showed successful exclusion of the aneurysm with preservation of blood flow down each iliac artery. The stiff wires were placed with Britta Mccreedy wires. The sheaths were removed and the pro-glide devices were sensed down. Both groins were hemostatic. 50 mg of protamine was administered. The skin was closed with 4-0 Vicryl. Dermabond was placed. The patient had palpable pedal pulses at the end of the procedure.   Disposition:  To PACU in stable condition.   Theotis Burrow, M.D. [facility] [place] Office: [phone] Pager:  [phone]

## 2012-08-08 NOTE — Interval H&P Note (Signed)
History and Physical Interval Note:  08/08/2012 7:22 AM  Garrett Meek Sr.  has presented today for surgery, with the diagnosis of Abdominal Aortic Aneurysm  The various methods of treatment have been discussed with the patient and family. After consideration of risks, benefits and other options for treatment, the patient has consented to  Procedure(s) (LRB) with comments: ABDOMINAL AORTIC ENDOVASCULAR STENT GRAFT (N/A) - EVAR- Gore as a surgical intervention .  The patient's history has been reviewed, patient examined, no change in status, stable for surgery.  I have reviewed the patient's chart and labs.  Questions were answered to the patient's satisfaction.     [name], V. WELLS

## 2012-08-08 NOTE — Telephone Encounter (Signed)
Message copied by Berniece Salines on Thu Aug 08, 2012 10:49 AM ------      Message from: Peter Minium K      Created: Thu Aug 08, 2012 10:14 AM      Regarding: schedule                   ----- Message -----         From: Gabriel Earing, PA         Sent: 08/08/2012   9:43 AM           To: Mena Goes, CMA            S/p EVAR 08/08/12 by [facility]            F/u in 4 weeks with CTA            Thanks,      Aldona Bar

## 2012-08-08 NOTE — Anesthesia Procedure Notes (Signed)
Procedures RIJ CVP [id number]: The patient was identified and consent obtained.  TO was performed, and full barrier precautions were used.  The skin was anesthetized with lidocaine.  Once the vein was located with the 22 ga. needle using ultrasound guidance , the wire was inserted into the vein.  The wire location was confirmed with ultrasound.  The tissue was dilated and the catheter was carefully inserted, then sutured in place. A dressing was applied. The patient tolerated the procedure well. CE

## 2012-08-08 NOTE — Progress Notes (Signed)
08/08/2012 11:26 AM Pt arrived from pacu,vss, & pulses palpable. Safety discussed. Pt family at bedside. Irish Elders

## 2012-08-08 NOTE — Anesthesia Preprocedure Evaluation (Addendum)
Anesthesia Evaluation  Patient identified by MRN, date of birth, ID band Patient awake    Reviewed: Allergy & Precautions, H&P , NPO status , Patient's Chart, lab work & pertinent test results  Airway Mallampati: II      Dental   Pulmonary neg pulmonary ROS,  breath sounds clear to auscultation        Cardiovascular hypertension, + CAD and + Past MI Rhythm:Regular Rate:Normal     Neuro/Psych    GI/Hepatic GERD-  ,  Endo/Other  Hypothyroidism   Renal/GU Renal disease     Musculoskeletal   Abdominal   Peds  Hematology negative hematology ROS (+)   Anesthesia Other Findings   Reproductive/Obstetrics                          Anesthesia Physical Anesthesia Plan  ASA: III  Anesthesia Plan: General   Post-op Pain Management:    Induction: Intravenous  Airway Management Planned: Oral ETT  Additional Equipment: Arterial line and CVP  Intra-op Plan:   Post-operative Plan: Possible Post-op intubation/ventilation  Informed Consent: I have reviewed the patients History and Physical, chart, labs and discussed the procedure including the risks, benefits and alternatives for the proposed anesthesia with the patient or authorized representative who has indicated his/her understanding and acceptance.   Dental advisory given  Plan Discussed with: CRNA, Anesthesiologist and Surgeon  Anesthesia Plan Comments:        Anesthesia Quick Evaluation

## 2012-08-08 NOTE — Transfer of Care (Signed)
Immediate Anesthesia Transfer of Care Note  Patient: Garrett RACHOW Sr.  Procedure(s) Performed: Procedure(s) (LRB) with comments: ABDOMINAL AORTIC ENDOVASCULAR STENT GRAFT (N/A) - EVAR- Gore  Patient Location: PACU  Anesthesia Type: General  Level of Consciousness: awake, alert  and oriented  Airway & Oxygen Therapy: Patient Spontanous Breathing and Patient connected to nasal cannula oxygen  Post-op Assessment: Report given to PACU RN and Post -op Vital signs reviewed and stable  Post vital signs: Reviewed and stable  Complications: No apparent anesthesia complications

## 2012-08-09 LAB — BASIC METABOLIC PANEL
BUN: 10 mg/dL (ref 6–23)
CO2: 26 mEq/L (ref 19–32)
Calcium: 10.1 mg/dL (ref 8.4–10.5)
Creatinine, Ser: 0.9 mg/dL (ref 0.50–1.35)

## 2012-08-09 LAB — CBC
MCH: 31.9 pg (ref 26.0–34.0)
MCV: 87.1 fL (ref 78.0–100.0)
Platelets: 134 10*3/uL — ABNORMAL LOW (ref 150–400)
RBC: 4.11 MIL/uL — ABNORMAL LOW (ref 4.22–5.81)

## 2012-08-09 MED ORDER — POTASSIUM CHLORIDE CRYS ER 20 MEQ PO TBCR
40.0000 meq | EXTENDED_RELEASE_TABLET | Freq: Once | ORAL | Status: AC
  Administered 2012-08-09: 40 meq via ORAL
  Filled 2012-08-09: qty 2

## 2012-08-09 NOTE — Progress Notes (Signed)
Discharge instructions given to pt -- verbalized understanding of follow-up appts, when to call MD/EMS, s/s of complications, home meds, activity levels, dressing care and pain control. Armandina Gemma, Kirby Crigler

## 2012-08-09 NOTE — Progress Notes (Signed)
[facility] Progress Note  08/09/2012 7:18 AM POD 1  Subjective:  No complaints  VSS Afebrile 94%RA Filed Vitals:   08/09/12 0400  BP: 124/67  Pulse: 68  Temp: 98.5 F (36.9 C)  Resp: 16    Physical Exam: Incisions:  Bilateral groins soft without hematoma Extremities:  + palpable pedal pulses bilaterally. Abdomen:  Soft NT/ND Cardiac:  RRR Lungs:  CTAB  CBC    Component Value Date/Time   WBC 6.8 08/09/2012 0400   RBC 4.11* 08/09/2012 0400   HGB 13.1 08/09/2012 0400   HCT 35.8* 08/09/2012 0400   PLT 134* 08/09/2012 0400   MCV 87.1 08/09/2012 0400   MCH 31.9 08/09/2012 0400   MCHC 36.6* 08/09/2012 0400   RDW 13.1 08/09/2012 0400   LYMPHSABS 1.0 04/26/2009 2233   MONOABS 0.4 04/26/2009 2233   EOSABS 0.1 04/26/2009 2233   BASOSABS 0.0 04/26/2009 2233    BMET    Component Value Date/Time   NA 138 08/09/2012 0400   K 3.1* 08/09/2012 0400   CL 101 08/09/2012 0400   CO2 26 08/09/2012 0400   GLUCOSE 127* 08/09/2012 0400   BUN 10 08/09/2012 0400   CREATININE 0.90 08/09/2012 0400   CREATININE 1.02 02/15/2012 1121   CALCIUM 10.1 08/09/2012 0400   GFRNONAA 84* 08/09/2012 0400   GFRAA >90 08/09/2012 0400    INR    Component Value Date/Time   INR 1.18 08/08/2012 1000     Intake/Output Summary (Last 24 hours) at 08/09/12 0718 Last data filed at 08/09/12 0400  Gross per 24 hour  Intake 3566.25 ml  Output   3445 ml  Net 121.25 ml     Assessment/Plan:  71 y.o. male is s/p EVAR POD 1  -hypokalemia-supplement -doing well this am. -home today -f/u in 4 weeks with CTA  Leontine Locket, PA-C [facility] [phone] 08/09/2012 7:18 AM

## 2012-08-11 NOTE — Discharge Summary (Signed)
I agree with the above Both groins soft No buttock claudication To follow up in the office in one month with CTA  Annamarie Major

## 2012-08-11 NOTE — Progress Notes (Signed)
I agree with the above To follow up in the office  [facility]

## 2012-09-06 ENCOUNTER — Encounter: Payer: Self-pay | Admitting: Surgery

## 2012-09-09 ENCOUNTER — Ambulatory Visit: Payer: Medicare Other | Admitting: Surgery

## 2012-09-09 ENCOUNTER — Ambulatory Visit (INDEPENDENT_AMBULATORY_CARE_PROVIDER_SITE_OTHER): Payer: Medicare Other | Admitting: Surgery

## 2012-09-09 ENCOUNTER — Ambulatory Visit
Admission: RE | Admit: 2012-09-09 | Discharge: 2012-09-09 | Disposition: A | Discharge status: Home / Self Care | Payer: Medicare Other | Source: Ambulatory Visit | Attending: Physician Assistant | Admitting: Physician Assistant

## 2012-09-09 ENCOUNTER — Encounter: Payer: Self-pay | Admitting: Surgery

## 2012-09-09 VITALS — BP 129/91 | HR 67 | Resp 16 | Ht 70.0 in | Wt 211.0 lb

## 2012-09-09 DIAGNOSIS — Z48812 Encounter for surgical aftercare following surgery on the circulatory system: Secondary | ICD-10-CM

## 2012-09-09 DIAGNOSIS — I714 Abdominal aortic aneurysm, without rupture: Secondary | ICD-10-CM

## 2012-09-09 MED ORDER — IOHEXOL 350 MG/ML SOLN
100.0000 mL | Freq: Once | INTRAVENOUS | Status: AC | PRN
  Administered 2012-09-09: 100 mL via INTRAVENOUS

## 2012-09-09 NOTE — Progress Notes (Signed)
The patient is back today for followup. He underwent percutaneous endovascular aneurysm repair on September 26. Prior to this he had undergone embolization of his right hypogastric artery in order to exclude a right common iliac aneurysm. This was done back in April. His aneurysm surgery was delayed because of right buttock claudication which has significantly improved. His stent graft procedure was uncomplicated and he was discharged to home the following day. He back today for further evaluation. He has noted no change in his buttock symptoms which are very tolerable. He is almost back to full activity. He has no complaints at this time.  On examination his groin incisions have healed nicely.  I have reviewed his CT scan which shows a decrease in the size of his aneurysm, it now measures 5.1. Previously it was 5.4. 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  I have explained the significance of endoleak. I will try to monitor him with ultrasound however and very minimum, I will get a CT angiogram and is one year anniversary. He will come back to see me in 3 months with a duplex ultrasound

## 2012-09-10 NOTE — Addendum Note (Signed)
Addended by: Mena Goes on: 09/10/2012 09:12 AM   Modules accepted: Orders

## 2012-12-06 ENCOUNTER — Encounter: Payer: Self-pay | Admitting: Surgery

## 2012-12-09 ENCOUNTER — Encounter (INDEPENDENT_AMBULATORY_CARE_PROVIDER_SITE_OTHER): Payer: Medicare Other | Admitting: *Deleted

## 2012-12-09 ENCOUNTER — Encounter: Payer: Self-pay | Admitting: Surgery

## 2012-12-09 ENCOUNTER — Ambulatory Visit (INDEPENDENT_AMBULATORY_CARE_PROVIDER_SITE_OTHER): Payer: Medicare Other | Admitting: Surgery

## 2012-12-09 ENCOUNTER — Ambulatory Visit
Admission: RE | Admit: 2012-12-09 | Discharge: 2012-12-09 | Disposition: A | Discharge status: Home / Self Care | Payer: Medicare Other | Source: Ambulatory Visit | Attending: Surgery | Admitting: Surgery

## 2012-12-09 ENCOUNTER — Telehealth: Payer: Self-pay | Admitting: Surgery

## 2012-12-09 ENCOUNTER — Other Ambulatory Visit: Payer: Self-pay | Admitting: Surgery

## 2012-12-09 VITALS — BP 130/84 | HR 58 | Ht 70.0 in | Wt 208.6 lb

## 2012-12-09 DIAGNOSIS — I714 Abdominal aortic aneurysm, without rupture, unspecified: Secondary | ICD-10-CM

## 2012-12-09 DIAGNOSIS — Z48812 Encounter for surgical aftercare following surgery on the circulatory system: Secondary | ICD-10-CM

## 2012-12-09 LAB — BUN: BUN: 14 mg/dL (ref 6–23)

## 2012-12-09 MED ORDER — IOHEXOL 350 MG/ML SOLN
100.0000 mL | Freq: Once | INTRAVENOUS | Status: AC | PRN
  Administered 2012-12-09: 100 mL via INTRAVENOUS

## 2012-12-09 NOTE — Progress Notes (Addendum)
[facility] [place]   Patient name: Garrett BOARDWINE Sr. MRN: [id number] DOB: Jan 13, 1941 Sex: male     Chief Complaint  Patient presents with  . AAA    3 month f/u, s/p EVAR 08/08/2012    HISTORY OF PRESENT ILLNESS: The patient is back today for followup. He is status post endovascular aneurysm repair on 08/08/2012. Prior to that he had embolization of his right hypogastric artery. He did develop buttock claudication following his embolization, and therefore his aneurysm repair was delayed several months. He has had no issues since his operation. He will occasionally get right buttock pain, but this is infrequent and not lifestyle limiting. His initial CT scan showed that his aneurysm had decreased in size from 5.4-5 x 1 cm. 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 He reports no nominal pain today.  Past Medical History  Diagnosis Date  . AAA (abdominal aortic aneurysm)   . CAD (coronary artery disease)   . Hypertension   . Atrial fibrillation   . Hyperlipidemia   . Cancer     prostate  . Thyroid disease     hypothyroidism  . Arthritis   . Reflux   . Gout   . Nerve compression     right leg  . Myocardial infarction 01/1991    sp inferior  . Hypothyroidism   . Seasonal allergies   . GERD (gastroesophageal reflux disease)   . Kidney stone     right kidney    Past Surgical History  Procedure Date  . Parathyroid adenoma   . Spine surgery   . Kidney stone surgery   . Tonsillectomy   . Prostate surgery     rad seeds  . Coronary stent placement 2009    RCA  . Abdominal aortagram embolization 02/28/2012  . Coronary artery bypass graft 04/2009    History   Social History  . Marital Status: Divorced    Spouse Name: N/A    Number of Children: N/A  . Years of Education: N/A   Occupational History  . Not on file.   Social History Main Topics  . Smoking status: Former Smoker    Types: Cigarettes    Quit date:  08/01/1970  . Smokeless tobacco: Never Used  . Alcohol Use: 6.0 oz/week    10 Glasses of wine per week     Comment: 1-3 glasses 3-4 times a week  . Drug Use: No  . Sexually Active: Not on file   Other Topics Concern  . Not on file   Social History Narrative  . No narrative on file    Family History  Problem Relation Age of Onset  . Heart disease Father   . Heart attack Father   . Stroke Mother   . Heart disease Sister   . Heart disease Paternal Uncle   . Diabetes Maternal Grandmother     Allergies as of 12/09/2012 - Review Complete 12/09/2012  Allergen Reaction Noted  . Ace inhibitors Cough 07/03/2011  . Lopressor (metoprolol tartrate) Cough 07/03/2011  . Nitroglycerin Other (See Comments) 08/21/2011    Current Outpatient Prescriptions on File Prior to Visit  Medication Sig Dispense Refill  . acetaminophen (TYLENOL) 650 MG CR tablet Take 650 mg by mouth 2 (two) times daily as needed. For pain      . allopurinol (ZYLOPRIM) 300 MG tablet Take 300 mg by mouth daily at 6 PM.       . amLODipine (  NORVASC) 5 MG tablet Take 5 mg by mouth daily.       Marland Kitchen aspirin EC 81 MG tablet Take 81 mg by mouth daily at 6 PM.      . clonazePAM (KLONOPIN) 1 MG tablet Take 0.5 mg by mouth at bedtime.       Marland Kitchen diltiazem (CARDIZEM CD) 240 MG 24 hr capsule Take 240 mg by mouth daily at 6 PM.       . famotidine (PEPCID) 20 MG tablet Take 20 mg by mouth daily at 6 PM.       . fluticasone (FLONASE) 50 MCG/ACT nasal spray Place 2 sprays into the nose daily as needed. Nasal allergies      . levothyroxine (SYNTHROID, LEVOTHROID) 150 MCG tablet Take 150 mcg by mouth daily.       Marland Kitchen loratadine-pseudoephedrine (CLARITIN-D 24-HOUR) 10-240 MG per 24 hr tablet Take 1 tablet by mouth daily.       . Menthol-Methyl Salicylate (BENGAY ARTHRITIS FORMULA EX) Apply 1 application topically daily as needed. Apply to shoulders, knees, elbows, and ankles for pain      . metoprolol (TOPROL-XL) 100 MG 24 hr tablet Take 100 mg  by mouth daily.       . Multiple Vitamin (MULTIVITAMIN PO) Take 1 tablet by mouth daily.       . niacin (NIASPAN) 1000 MG CR tablet Take 1,000 mg by mouth daily at 6 PM.       . olmesartan-hydrochlorothiazide (BENICAR HCT) 40-25 MG per tablet Take 1 tablet by mouth daily.       Marland Kitchen oxyCODONE (ROXICODONE) 5 MG immediate release tablet Take 1 tablet (5 mg total) by mouth every 6 (six) hours as needed for pain.  30 tablet  0  . rosuvastatin (CRESTOR) 10 MG tablet Take 10 mg by mouth daily at 6 PM.       . Tamsulosin HCl (FLOMAX) 0.4 MG CAPS Take 0.4 mg by mouth daily at 6 PM. One q other day      . vitamin B-12 (CYANOCOBALAMIN) 1000 MCG tablet Take 5,000 mcg by mouth daily at 6 PM.      . Omega-3 Fatty Acids (FISH OIL) 1200 MG CAPS Take 1,200 mg by mouth 2 (two) times daily.         REVIEW OF SYSTEMS: No change from prior visit  PHYSICAL EXAMINATION:   Vital signs are BP 130/84  Pulse 58  Ht 5\' 10"  (1.778 m)  Wt 208 lb 9.6 oz (94.62 kg)  BMI 29.93 kg/m2  SpO2 98% General: The patient appears their stated age. HEENT:  No gross abnormalities Pulmonary:  Non labored breathing Abdomen: Soft and non-tender. Aneurysm cannot be palpated Musculoskeletal: There are no major deformities. Neurologic: No focal weakness or paresthesias are detected, Skin: There are no ulcer or rashes noted. Psychiatric: The patient has normal affect. Cardiovascular: There is a regular rate and rhythm without significant murmur appreciated. No carotid bruits   Diagnostic Studies Ultrasound was reviewed today. This shows a significant increase in the size of the diameter of his aneurysm. It measured 6.8 cm today.  Assessment: Status post endovascular aneurysm repair Plan: It is somewhat alarming that he has had a 1.7 cm increase in the size of his aneurysm within 6 months. He does have a type II endoleak. I discussed the ultrasound findings today with the patient. I feel that he needs to undergo CT angiography to  confirm the validity of the ultrasound. If indeed his aneurysm has increased  this much he will require embolization of his endoleak. I have scheduled his CT antegrade for today. I will call him with the results.   Eldridge Abrahams, M.D. [facility] [place] Office: [phone] Pager:  [phone]  Addendum: I have reviewed the patient's CT scan which was ordered for this afternoon. This confirms that the aneurysm has not increased in size as the ultrasound had suggested. Infected has not changed since his last scan. The endoleak from the inferior mesenteric artery appears to have resolved, however there still is a persistent type II endoleak from a patent lumbar artery. I spoke to Garrett Jordan via telephone and informed him of these results  WB

## 2012-12-09 NOTE — Telephone Encounter (Signed)
This patient is having a stat CTA today at the [facility] location of [facility]. I gave Dorian Pod the pre-authorization [id number] for the cta from [facility] insurance. AWT

## 2012-12-11 ENCOUNTER — Telehealth: Payer: Self-pay | Admitting: Surgery

## 2012-12-11 ENCOUNTER — Other Ambulatory Visit: Payer: Self-pay | Admitting: *Deleted

## 2012-12-11 DIAGNOSIS — I714 Abdominal aortic aneurysm, without rupture: Secondary | ICD-10-CM

## 2012-12-11 DIAGNOSIS — Z48812 Encounter for surgical aftercare following surgery on the circulatory system: Secondary | ICD-10-CM

## 2012-12-11 NOTE — Telephone Encounter (Signed)
The telephone encounter entered earlier was in error. This pt had a STAT cta on Monday 12/09/12 ordered by [place]. Dr.Brabham reviewed the results and notified the patient on the afternoon of 12/09/12. Per the discharge sheet on the same day the patient is due to come back for follow up in 6 mo with aaa duplex and to see [facility]. Thanks, Shawn Stall

## 2012-12-11 NOTE — Telephone Encounter (Signed)
Per verbal instruction from Dr.Brabham on 12/10/12, this patient needs to come in on Monday 12/16/12 for a fasting mesenteric scan here in our office and to see him. [facility] originally ordered a CTA for the pt but the pt has contrast allergy and required dialysis treatment after the last CTA in 2012 and the pt also has a pacemaker so we are unable to schedule a MRA. The patient is aware of the appt on 12/16/12 and knows to be fasting for this ultrasound. awt

## 2012-12-13 ENCOUNTER — Other Ambulatory Visit: Payer: Self-pay | Admitting: Urology

## 2012-12-13 ENCOUNTER — Encounter (HOSPITAL_COMMUNITY): Payer: Self-pay | Admitting: *Deleted

## 2012-12-16 ENCOUNTER — Encounter (HOSPITAL_COMMUNITY): Payer: Self-pay | Admitting: *Deleted

## 2012-12-16 NOTE — H&P (Signed)
Urology History and Physical Exam  CC: kidney stone   HPI: 72 year old male presents for ESL of a symptomatic, non progressing 8x12 mm right proximal ureteral stone. He initially presented on 12/02/2012 for right flank pain, feeling that he had a stone. CT revealed an 8x12 mm stone at the right UPJ. Follow up a week ago confirmed non progression. He presents for ESL. SSD is 14 cm, stone measures 1100 HUs.  PMH: Past Medical History  Diagnosis Date  . AAA (abdominal aortic aneurysm)   . CAD (coronary artery disease)   . Hypertension   . Hyperlipidemia   . Thyroid disease     hypothyroidism  . Reflux   . Gout   . Nerve compression     right leg  . Hypothyroidism   . Seasonal allergies   . GERD (gastroesophageal reflux disease)   . Myocardial infarction 01/1991    sp inferior  . Atrial fibrillation   . Kidney stone     right kidney  . Cancer     prostate  . Arthritis     knees    PSH: Past Surgical History  Procedure Date  . Parathyroid adenoma   . Spine surgery   . Kidney stone surgery   . Tonsillectomy   . Prostate surgery     rad seeds  . Coronary stent placement 2009    RCA  . Abdominal aortagram embolization 02/28/2012  . Coronary artery bypass graft 04/2009    Allergies: Allergies  Allergen Reactions  . Ace Inhibitors Cough  . Lopressor (Metoprolol Tartrate) Cough  . Nitroglycerin Other (See Comments)    Very pronounced lowered BP with IV nitro for caths    Medications: No prescriptions prior to admission     Social History: History   Social History  . Marital Status: Divorced    Spouse Name: N/A    Number of Children: N/A  . Years of Education: N/A   Occupational History  . Not on file.   Social History Main Topics  . Smoking status: Former Smoker    Types: Cigarettes    Quit date: 08/01/1970  . Smokeless tobacco: Never Used  . Alcohol Use: 6.0 oz/week    10 Glasses of wine per week     Comment: 1-3 glasses 3-4 times a week  . Drug  Use: No  . Sexually Active: Not on file   Other Topics Concern  . Not on file   Social History Narrative  . No narrative on file    Family History: Family History  Problem Relation Age of Onset  . Heart disease Father   . Heart attack Father   . Stroke Mother   . Heart disease Sister   . Heart disease Paternal Uncle   . Diabetes Maternal Grandmother     Review of Systems: Genitourinary, constitutional, skin, eye, otolaryngeal, hematologic/lymphatic, cardiovascular, pulmonary, endocrine, musculoskeletal, gastrointestinal, neurological and psychiatric system(s) were reviewed and pertinent findings if present are noted.  Genitourinary: hematuria.  Gastrointestinal: flank pain, abdominal pain and heartburn.   Physical Exam: @VITALS2 @ General: No acute distress.  Awake. Head:  Normocephalic.  Atraumatic. ENT:  EOMI.  Mucous membranes moist Neck:  Supple.  No lymphadenopathy. CV:  S1 present. S2 present. Regular rate. Pulmonary: Equal effort bilaterally.  Clear to auscultation bilaterally. Abdomen: Soft.  Non tender to palpation. Skin:  Normal turgor.  No visible rash. Extremity: No gross deformity of bilateral upper extremities.  No gross deformity of    bilateral lower  extremities. Neurologic: Alert. Appropriate mood.    Studies:  No results found for this basename: HGB:2,WBC:2,PLT:2 in the last 72 hours  No results found for this basename: NA:2,K:2,CL:2,CO2:2,BUN:2,CREATININE:2,CALCIUM:2,MAGNESIUM:2,GFRNONAA:2,GFRAA:2 in the last 72 hours   No results found for this basename: PT:2,INR:2,APTT:2 in the last 72 hours   No components found with this basename: ABG:2    Assessment:  8x12 mm right UPJ stone  Plan: Right ESL

## 2012-12-16 NOTE — Pre-Procedure Instructions (Addendum)
Asked to bring blue folder the day of the procedure,insurance card,I.D. driver's license,wear comfortable clothing and have a driver for the day. Asked not to take Advil,Motrin,Ibuprofen,Aleve or any NSAIDS, Aspirin, or Toradol for 72 hours prior to procedure,  No vitamins or herbal medications 7 days prior to procedure. Instructed to take laxative per doctor's office instructions and eat a light dinner the evening before procedure.   To arrive at 0645 for lithotripsy procedure.  Called Dr. Frederico Hamman Tilley's office for office visit notes,diagnostic studies and EKG done in his office.

## 2012-12-18 ENCOUNTER — Encounter (HOSPITAL_COMMUNITY): Payer: Self-pay | Admitting: Pharmacy Technician

## 2012-12-19 ENCOUNTER — Encounter (HOSPITAL_COMMUNITY)
Admission: RE | Disposition: A | Discharge status: Home / Self Care | Payer: Self-pay | Source: Ambulatory Visit | Attending: Urology

## 2012-12-19 ENCOUNTER — Encounter (HOSPITAL_COMMUNITY): Payer: Self-pay

## 2012-12-19 ENCOUNTER — Ambulatory Visit (HOSPITAL_COMMUNITY): Payer: Medicare Other

## 2012-12-19 ENCOUNTER — Ambulatory Visit (HOSPITAL_COMMUNITY)
Admission: RE | Admit: 2012-12-19 | Discharge: 2012-12-19 | Disposition: A | Discharge status: Home / Self Care | Payer: Medicare Other | Source: Ambulatory Visit | Attending: Urology | Admitting: Urology

## 2012-12-19 DIAGNOSIS — I2510 Atherosclerotic heart disease of native coronary artery without angina pectoris: Secondary | ICD-10-CM | POA: Insufficient documentation

## 2012-12-19 DIAGNOSIS — Z951 Presence of aortocoronary bypass graft: Secondary | ICD-10-CM | POA: Insufficient documentation

## 2012-12-19 DIAGNOSIS — N200 Calculus of kidney: Secondary | ICD-10-CM | POA: Insufficient documentation

## 2012-12-19 DIAGNOSIS — E039 Hypothyroidism, unspecified: Secondary | ICD-10-CM | POA: Insufficient documentation

## 2012-12-19 DIAGNOSIS — K219 Gastro-esophageal reflux disease without esophagitis: Secondary | ICD-10-CM | POA: Insufficient documentation

## 2012-12-19 DIAGNOSIS — I10 Essential (primary) hypertension: Secondary | ICD-10-CM | POA: Insufficient documentation

## 2012-12-19 DIAGNOSIS — I252 Old myocardial infarction: Secondary | ICD-10-CM | POA: Insufficient documentation

## 2012-12-19 DIAGNOSIS — C61 Malignant neoplasm of prostate: Secondary | ICD-10-CM | POA: Insufficient documentation

## 2012-12-19 DIAGNOSIS — E785 Hyperlipidemia, unspecified: Secondary | ICD-10-CM | POA: Insufficient documentation

## 2012-12-19 DIAGNOSIS — N201 Calculus of ureter: Secondary | ICD-10-CM

## 2012-12-19 SURGERY — LITHOTRIPSY, ESWL
Anesthesia: LOCAL | Laterality: Right

## 2012-12-19 MED ORDER — DIPHENHYDRAMINE HCL 25 MG PO CAPS
25.0000 mg | ORAL_CAPSULE | ORAL | Status: AC
  Administered 2012-12-19: 25 mg via ORAL
  Filled 2012-12-19: qty 1

## 2012-12-19 MED ORDER — OXYCODONE HCL 5 MG PO TABS: 5.0000 mg | ORAL_TABLET | ORAL | Status: DC | PRN

## 2012-12-19 MED ORDER — ACETAMINOPHEN 10 MG/ML IV SOLN
1000.0000 mg | Freq: Four times a day (QID) | INTRAVENOUS | Status: DC
  Administered 2012-12-19: 1000 mg via INTRAVENOUS
  Filled 2012-12-19: qty 100

## 2012-12-19 MED ORDER — DEXTROSE-NACL 5-0.45 % IV SOLN
INTRAVENOUS | Status: DC
  Administered 2012-12-19: 08:00:00 via INTRAVENOUS

## 2012-12-19 MED ORDER — SODIUM CHLORIDE 0.9 % IJ SOLN: 3.0000 mL | Freq: Two times a day (BID) | INTRAMUSCULAR | Status: DC

## 2012-12-19 MED ORDER — ACETAMINOPHEN 650 MG RE SUPP
650.0000 mg | RECTAL | Status: DC | PRN
  Filled 2012-12-19: qty 1

## 2012-12-19 MED ORDER — SODIUM CHLORIDE 0.9 % IJ SOLN: 3.0000 mL | INTRAMUSCULAR | Status: DC | PRN

## 2012-12-19 MED ORDER — DIAZEPAM 5 MG PO TABS
10.0000 mg | ORAL_TABLET | ORAL | Status: AC
  Administered 2012-12-19: 10 mg via ORAL
  Filled 2012-12-19: qty 2

## 2012-12-19 MED ORDER — ONDANSETRON HCL 4 MG/2ML IJ SOLN: 4.0000 mg | Freq: Four times a day (QID) | INTRAMUSCULAR | Status: DC | PRN

## 2012-12-19 MED ORDER — ACETAMINOPHEN 325 MG PO TABS: 650.0000 mg | ORAL_TABLET | ORAL | Status: DC | PRN

## 2012-12-19 MED ORDER — LEVOFLOXACIN 500 MG PO TABS
500.0000 mg | ORAL_TABLET | ORAL | Status: AC
  Administered 2012-12-19: 500 mg via ORAL
  Filled 2012-12-19: qty 1

## 2012-12-19 MED ORDER — SODIUM CHLORIDE 0.9 % IV SOLN: 250.0000 mL | INTRAVENOUS | Status: DC | PRN

## 2012-12-19 NOTE — Progress Notes (Signed)
Asst up to void w/o success

## 2012-12-19 NOTE — Progress Notes (Signed)
Right flank area has softball sized area of pink with a few fine blood blisters in the center. No bleeding

## 2012-12-19 NOTE — Progress Notes (Signed)
Post lithotripsy  Ambulated to BR voided moderate amount cherry red urine no clots. Pt ready to go home

## 2013-01-22 IMAGING — CT CT CTA ABD/PEL W/CM AND/OR W/O CM
1 of 7 series · 12 of 46 positions shown, 18 images · IV contrast ([ID] [id number])
Comparison: None.

CLINICAL DATA: Abdominal aortic aneurysm

CT ANGIOGRAPHY ABDOMEN AND PELVIS
TECHNIQUE: Multidetector CT imaging of the abdomen and pelvis was
performed using the standard protocol during bolus administration
of intravenous contrast.  Multiplanar reconstructed images
including MIPs were obtained and reviewed to evaluate the vascular
anatomy.
Contrast: 100mL OMNIPAQUE IOHEXOL 350 MG/ML SOLN

[Series 5: angio · axial · 0.78mm/px · z∈[-466,-88]mm · 12 of 179 slices shown, 18 images]
[im 14/179  soft-tissue]
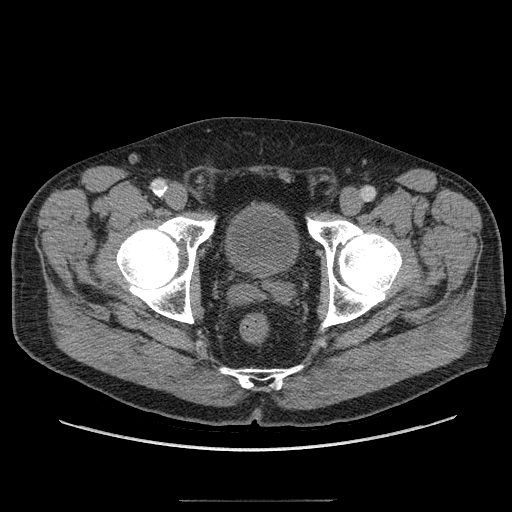
[im 14/179  bone]
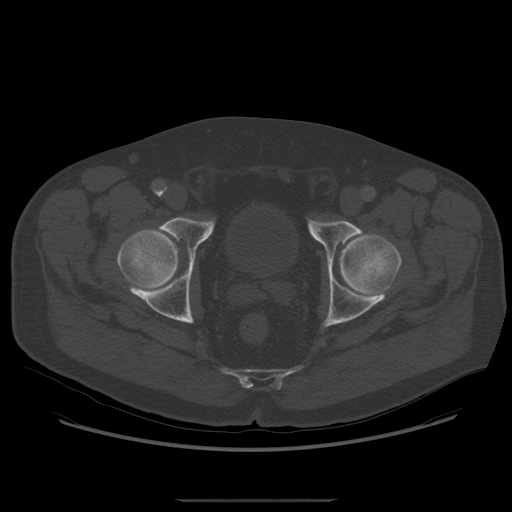
[im 28/179  soft-tissue]
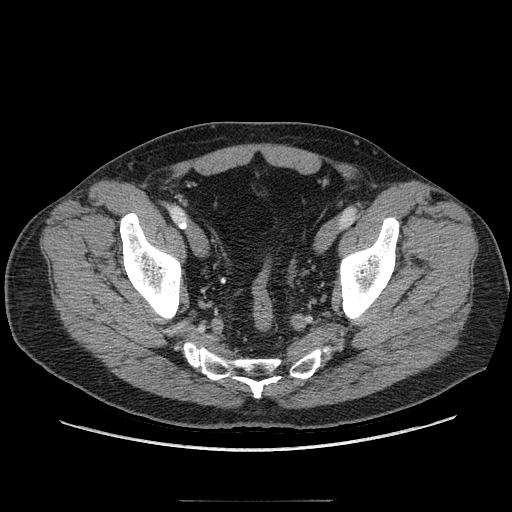
[im 42/179  soft-tissue]
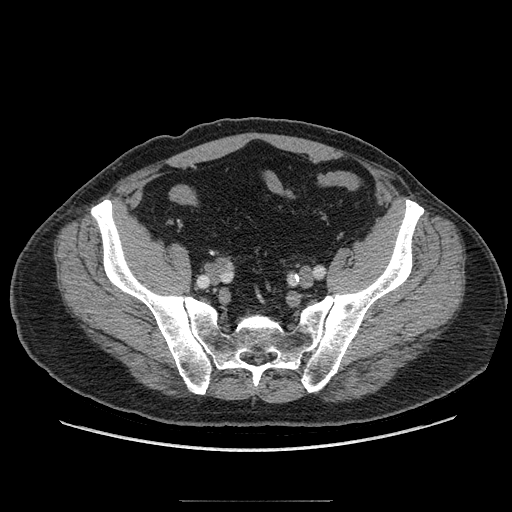
[im 55/179  soft-tissue]
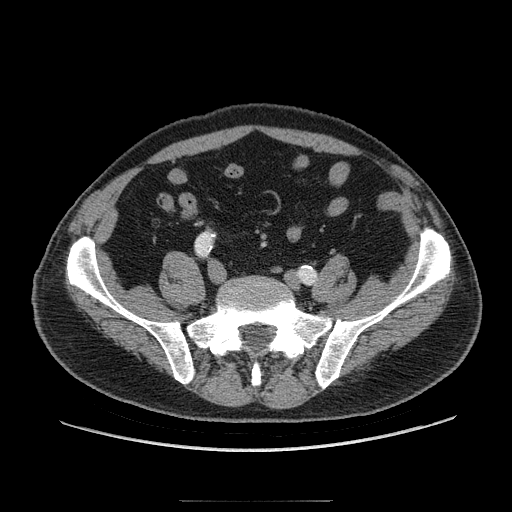
[im 69/179  soft-tissue]
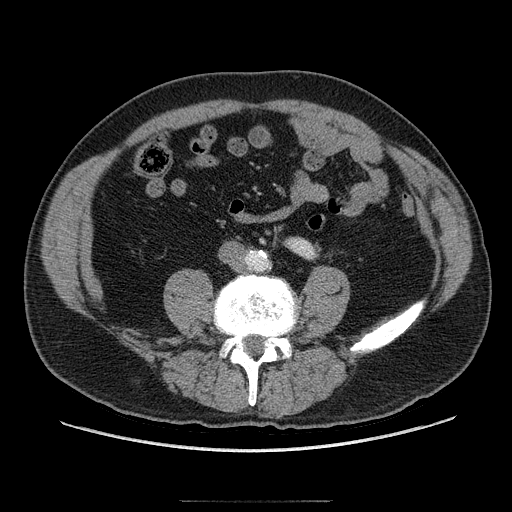
[im 83/179  soft-tissue]
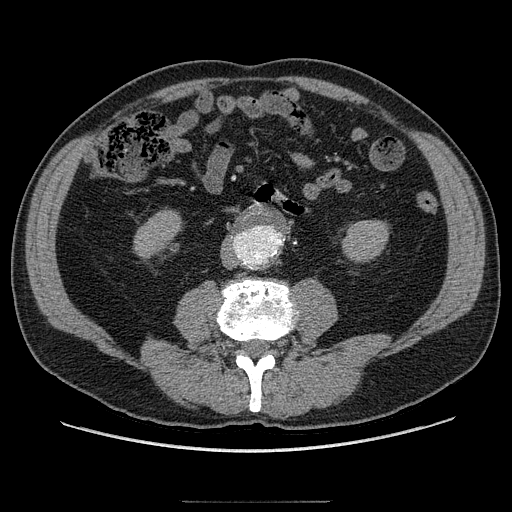
[im 96/179  soft-tissue]
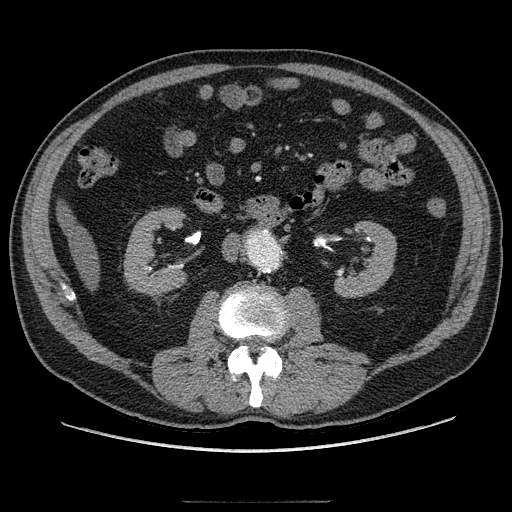
[im 110/179  soft-tissue]
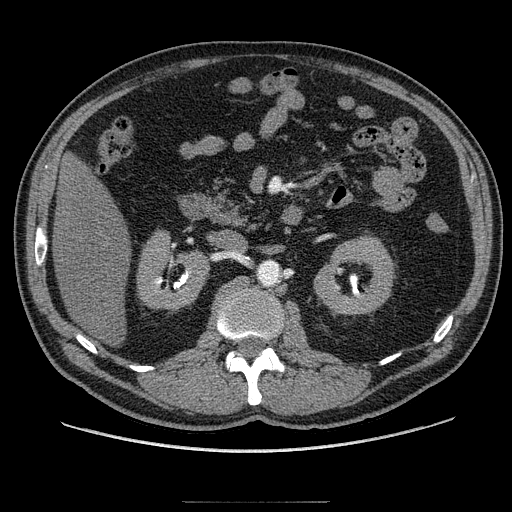
[im 124/179  soft-tissue]
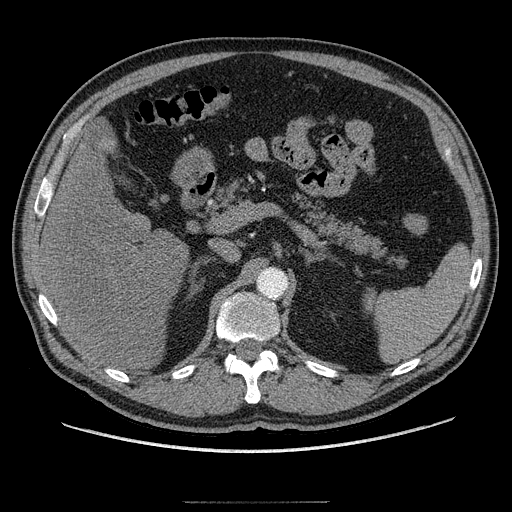
[im 124/179  lung]
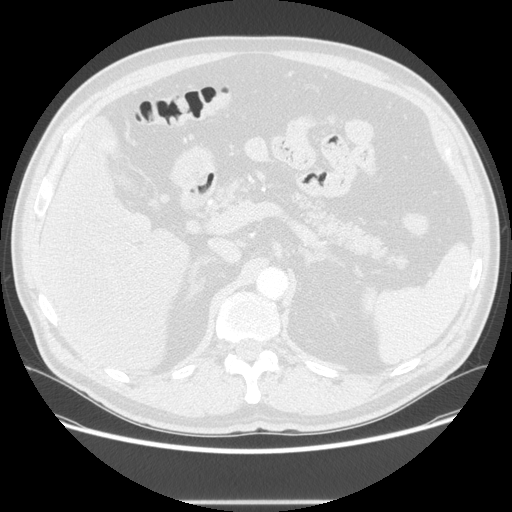
[im 124/179  bone]
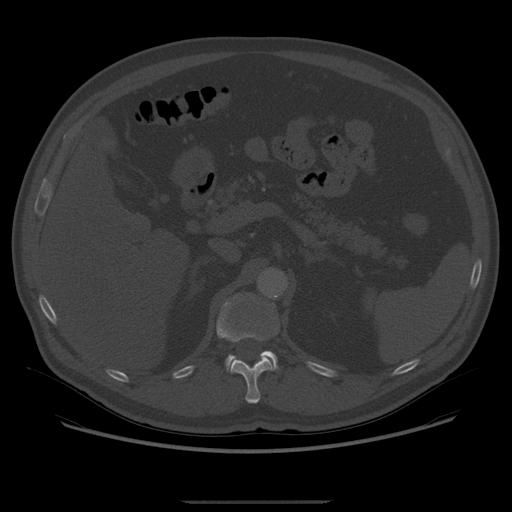
[im 137/179  soft-tissue]
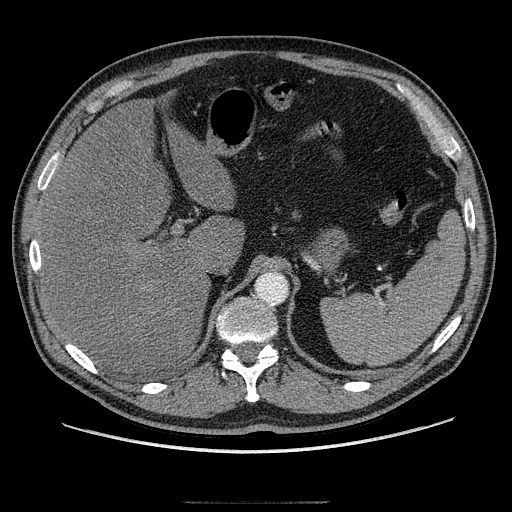
[im 137/179  lung]
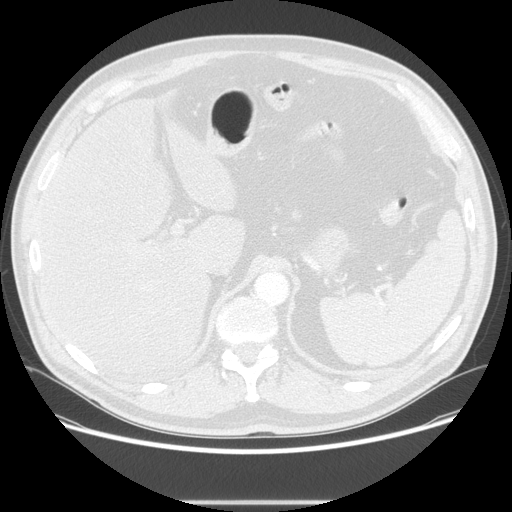
[im 151/179  soft-tissue]
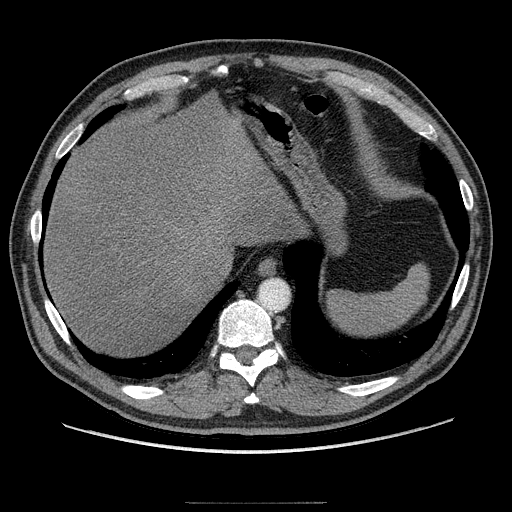
[im 151/179  lung]
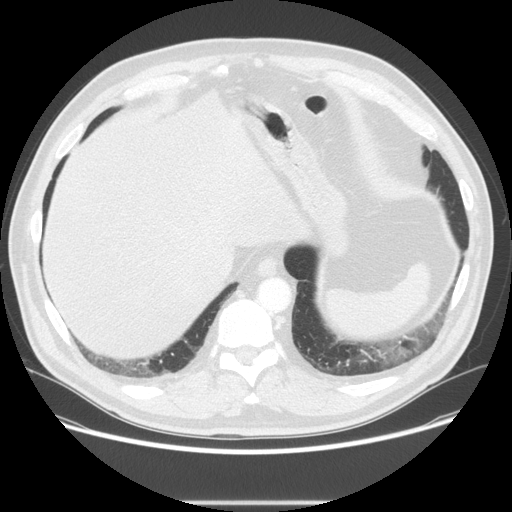
[im 165/179  soft-tissue]
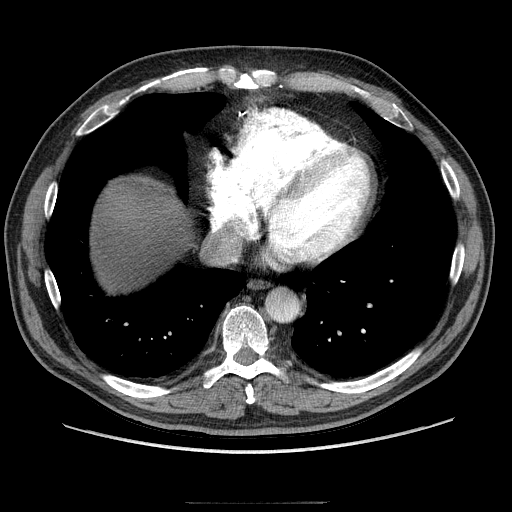
[im 165/179  lung]
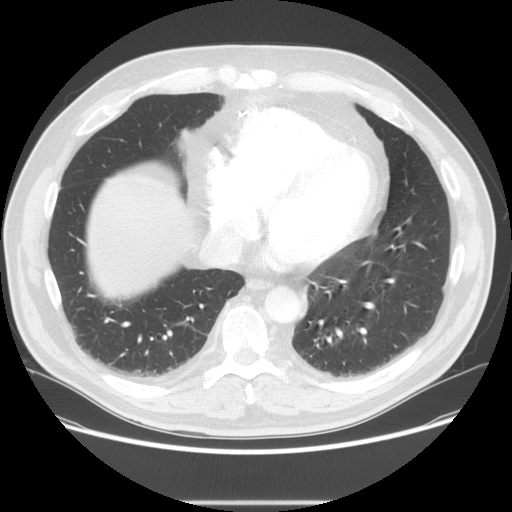

[12 of 46 positions shown; findings below may reference images not displayed]

FINDINGS: Infrarenal abdominal aortic aneurysm with maximal AP and
transverse diameters of 5.2 and 4.6 cm.  The neck is approximately
2.3 cm.

Single renal arteries are patent bilaterally.  Celiac and SMA are
widely patent.  The proximal IMA is markedly tortuous.  There is
significant atherosclerotic disease at its origin.  The vessel is
ectatic with diameter of 5 mm.

The aneurysm ends just above the bifurcation with a length of
approximately 7.5 cm.  Lumen of the distal aorta is patent.
Atherosclerotic plaque in the proximal common iliac arteries is
noted bilaterally without significant narrowing.  Vessels are
markedly tortuous with minimal calcification.  Maximal right and
left common iliac diameters are 16 and 17 mm.  External iliac
arteries are patent.  Right internal iliac artery aneurysm is
present with a diameter of 2.4 cm.  Internal iliac arteries are
patent.

Dependent atelectasis at both lung bases.  Cholelithiasis.  Diffuse
hepatic steatosis.  Spleen, pancreas, adrenal glands, kidneys are
within normal limits.  Normal appendix.

Brachytherapy pellets in the prostate gland.  Bladder is
decompressed.

No free fluid.  No abnormal adenopathy by measurement criteria.

Degenerative changes at L4-5 with left hemilaminectomy defect.
Bilateral inguinal hernia containing fat.

 Review of the MIP images confirms the above findings.
IMPRESSION: Infrarenal abdominal aortic aneurysm as described.  Maximal AP
diameter is 5.2 cm.

Right internal iliac artery aneurysm with a diameter of 2.4 cm.

Cholelithiasis.

## 2013-06-06 ENCOUNTER — Encounter: Payer: Self-pay | Admitting: Surgery

## 2013-06-09 ENCOUNTER — Encounter: Payer: Self-pay | Admitting: Surgery

## 2013-06-09 ENCOUNTER — Encounter (INDEPENDENT_AMBULATORY_CARE_PROVIDER_SITE_OTHER): Payer: Medicare Other | Admitting: *Deleted

## 2013-06-09 ENCOUNTER — Ambulatory Visit (INDEPENDENT_AMBULATORY_CARE_PROVIDER_SITE_OTHER): Payer: Medicare Other | Admitting: Surgery

## 2013-06-09 VITALS — BP 150/86 | HR 47 | Resp 16 | Ht 70.0 in | Wt 195.0 lb

## 2013-06-09 DIAGNOSIS — I714 Abdominal aortic aneurysm, without rupture, unspecified: Secondary | ICD-10-CM

## 2013-06-09 DIAGNOSIS — Z48812 Encounter for surgical aftercare following surgery on the circulatory system: Secondary | ICD-10-CM | POA: Insufficient documentation

## 2013-06-09 NOTE — Progress Notes (Signed)
[facility] [place]   Patient name: Garrett COOLING Sr. MRN: [id number] DOB: 1941/09/23 Sex: male     Chief Complaint  Patient presents with  . AAA    6 mo F/up with vascular lab study.    HISTORY OF PRESENT ILLNESS: The patient is back today for followup. He is status post endovascular aneurysm repair on 08/08/2012. Prior to that he had embolization of his right hypogastric artery. He did develop buttock claudication following his embolization, and therefore his aneurysm repair was delayed several months. He has had no issues since his operation. He will occasionally get right buttock pain, but this is infrequent and not lifestyle limiting. His initial CT scan showed that his aneurysm had decreased in size from 5.4 to 5 x 1 cm. 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 His ultrasound and January suggested that the aneurysm measured 6.8 cm. He was sent for an emergent CT scan which showed that this measurement was an accurate and the aneurysm diameter was 5.4 cm. Since his last visit the patient has been diagnosed as a type II diabetic. He has also changed his cholesterol medicine to every other day. He states that he has more energy and left leg cramps. He is walking to a half to 3 miles every day.   Past Medical History  Diagnosis Date  . AAA (abdominal aortic aneurysm)   . CAD (coronary artery disease)   . Hypertension   . Hyperlipidemia   . Thyroid disease     hypothyroidism  . Reflux   . Gout   . Nerve compression     right leg  . Hypothyroidism   . Seasonal allergies   . GERD (gastroesophageal reflux disease)   . Myocardial infarction 01/1991    sp inferior  . Atrial fibrillation   . Kidney stone     right kidney  . Cancer     prostate  . Arthritis     knees  . 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 Parathyroid adenoma    . Spine  surgery    . Kidney stone surgery    . Tonsillectomy    . Prostate surgery      rad seeds  . Coronary stent placement  1992 and  2009    RCA  . Abdominal aortagram embolization  02/28/2012  . Coronary artery bypass graft  04/2009  . Abdominal aortic aneurysm repair  Sept. 2013    History   Social History  . Marital Status: Divorced    Spouse Name: N/A    Number of Children: N/A  . Years of Education: N/A   Occupational History  . Not on file.   Social History Main Topics  . Smoking status: Former Smoker    Types: Cigarettes    Quit date: 08/01/1970  . Smokeless tobacco: Never Used  . Alcohol Use: 6.0 oz/week    10 Glasses of wine per week     Comment: 1-3 glasses 3-4 times a week  . Drug Use: No  . Sexually Active: Not on file   Other Topics Concern  . Not on file   Social History Narrative  . No narrative on file    Family History  Problem Relation Age of Onset  . Heart disease Father     Heart Disease before age 61  . Heart attack Father   .  Hyperlipidemia Father   . Hypertension Father   . Stroke Mother   . Deep vein thrombosis Mother   . Heart disease Sister   . Diabetes Sister   . Hyperlipidemia Sister   . Hypertension Sister   . Heart attack Sister   . Heart disease Paternal Uncle   . Diabetes Maternal Grandmother   . Diabetes Sister     Allergies as of 06/09/2013 - Review Complete 06/09/2013  Allergen Reaction Noted  . Ace inhibitors Cough 07/03/2011  . Lopressor (metoprolol tartrate) Cough 07/03/2011  . Nitroglycerin Other (See Comments) 08/21/2011    Current Outpatient Prescriptions on File Prior to Visit  Medication Sig Dispense Refill  . acetaminophen (TYLENOL) 650 MG CR tablet Take 650 mg by mouth 2 (two) times daily as needed. For pain      . allopurinol (ZYLOPRIM) 300 MG tablet Take 300 mg by mouth daily at 6 PM.       . amLODipine (NORVASC) 5 MG tablet Take 5 mg by mouth every morning.       Marland Kitchen aspirin EC 81 MG tablet Take 81 mg by  mouth daily at 6 PM.      . clonazePAM (KLONOPIN) 1 MG tablet Take 0.5 mg by mouth at bedtime.       Marland Kitchen diltiazem (CARDIZEM CD) 240 MG 24 hr capsule Take 240 mg by mouth daily at 6 PM.       . famotidine (PEPCID) 20 MG tablet Take 20 mg by mouth daily at 6 PM.       . fluticasone (FLONASE) 50 MCG/ACT nasal spray Place 2 sprays into the nose daily as needed. Nasal allergies      . levothyroxine (SYNTHROID, LEVOTHROID) 150 MCG tablet Take 150 mcg by mouth daily.       Marland Kitchen loratadine-pseudoephedrine (CLARITIN-D 24-HOUR) 10-240 MG per 24 hr tablet Take 1 tablet by mouth every morning.       . metoprolol succinate (TOPROL-XL) 50 MG 24 hr tablet Take 50 mg by mouth every morning. Take with or immediately following a meal.      . Multiple Vitamin (MULTIVITAMIN WITH MINERALS) TABS Take 1 tablet by mouth daily.      Marland Kitchen olmesartan-hydrochlorothiazide (BENICAR HCT) 40-25 MG per tablet Take 1 tablet by mouth every morning.       . Omega-3 Fatty Acids (FISH OIL) 1200 MG CAPS Take 1,200 mg by mouth 2 (two) times daily.      . rosuvastatin (CRESTOR) 10 MG tablet Take 10 mg by mouth every other day.       . Tamsulosin HCl (FLOMAX) 0.4 MG CAPS Take 0.4 mg by mouth every other day.       . vitamin B-12 (CYANOCOBALAMIN) 1000 MCG tablet Take 5,000 mcg by mouth daily at 6 PM.      . bismuth subsalicylate (PEPTO BISMOL) 262 MG/15ML suspension Take 15 mLs by mouth every 6 (six) hours as needed.      . niacin (NIASPAN) 1000 MG CR tablet Take 1,000 mg by mouth daily at 6 PM.       . oxyCODONE (ROXICODONE) 5 MG immediate release tablet Take 1 tablet (5 mg total) by mouth every 6 (six) hours as needed for pain.  30 tablet  0   No current facility-administered medications on file prior to visit.     REVIEW OF SYSTEMS: See history of present illness, otherwise no change   PHYSICAL EXAMINATION:   Vital signs are BP 150/86  Pulse 47  Resp 16  Ht 5\' 10"  (1.778 m)  Wt 195 lb (88.451 kg)  BMI 27.98 kg/m2  SpO2  97% General: The patient appears their stated age. HEENT:  No gross abnormalities Pulmonary:  Non labored breathing Abdomen: Soft and non-tender. Aneurysm is not palpated  Musculoskeletal: There are no major deformities. Neurologic: No focal weakness or paresthesias are detected, Skin: There are no ulcer or rashes noted. Psychiatric: The patient has normal affect. Cardiovascular: There is a regular rate and rhythm without significant murmur appreciated.Palpable femoral pulses   Diagnostic Studies  Again, the ultrasound today shows a 6.6 cm aneurysm with a type II endoleak.  Assessment:  status post endovascular aneurysm repair  Plan: I discussed the ultrasound findings today with the patient. I do not feel that they are accurate since similar findings were found 6 months ago and a CT scan was done which showed no significant change in his aneurysm. I am: Having back in December for a repeat CT scan. He may just be somebody that cannot be followed with ultrasound  V. Leia Alf, M.D. [facility] [place] Office: [phone] Pager:  [phone]

## 2013-06-10 NOTE — Addendum Note (Signed)
Addended by: Mena Goes on: 06/10/2013 09:42 AM   Modules accepted: Orders

## 2013-07-12 IMAGING — CR DG CHEST 1V PORT
1 series · 1 of 1 positions shown · non-contrast
Comparison: 07/31/2012

CLINICAL DATA: Postop from the abdominal aortic aneurysm stent
graft repair.  Coronary artery disease.

PORTABLE CHEST - 1 VIEW

[AP]
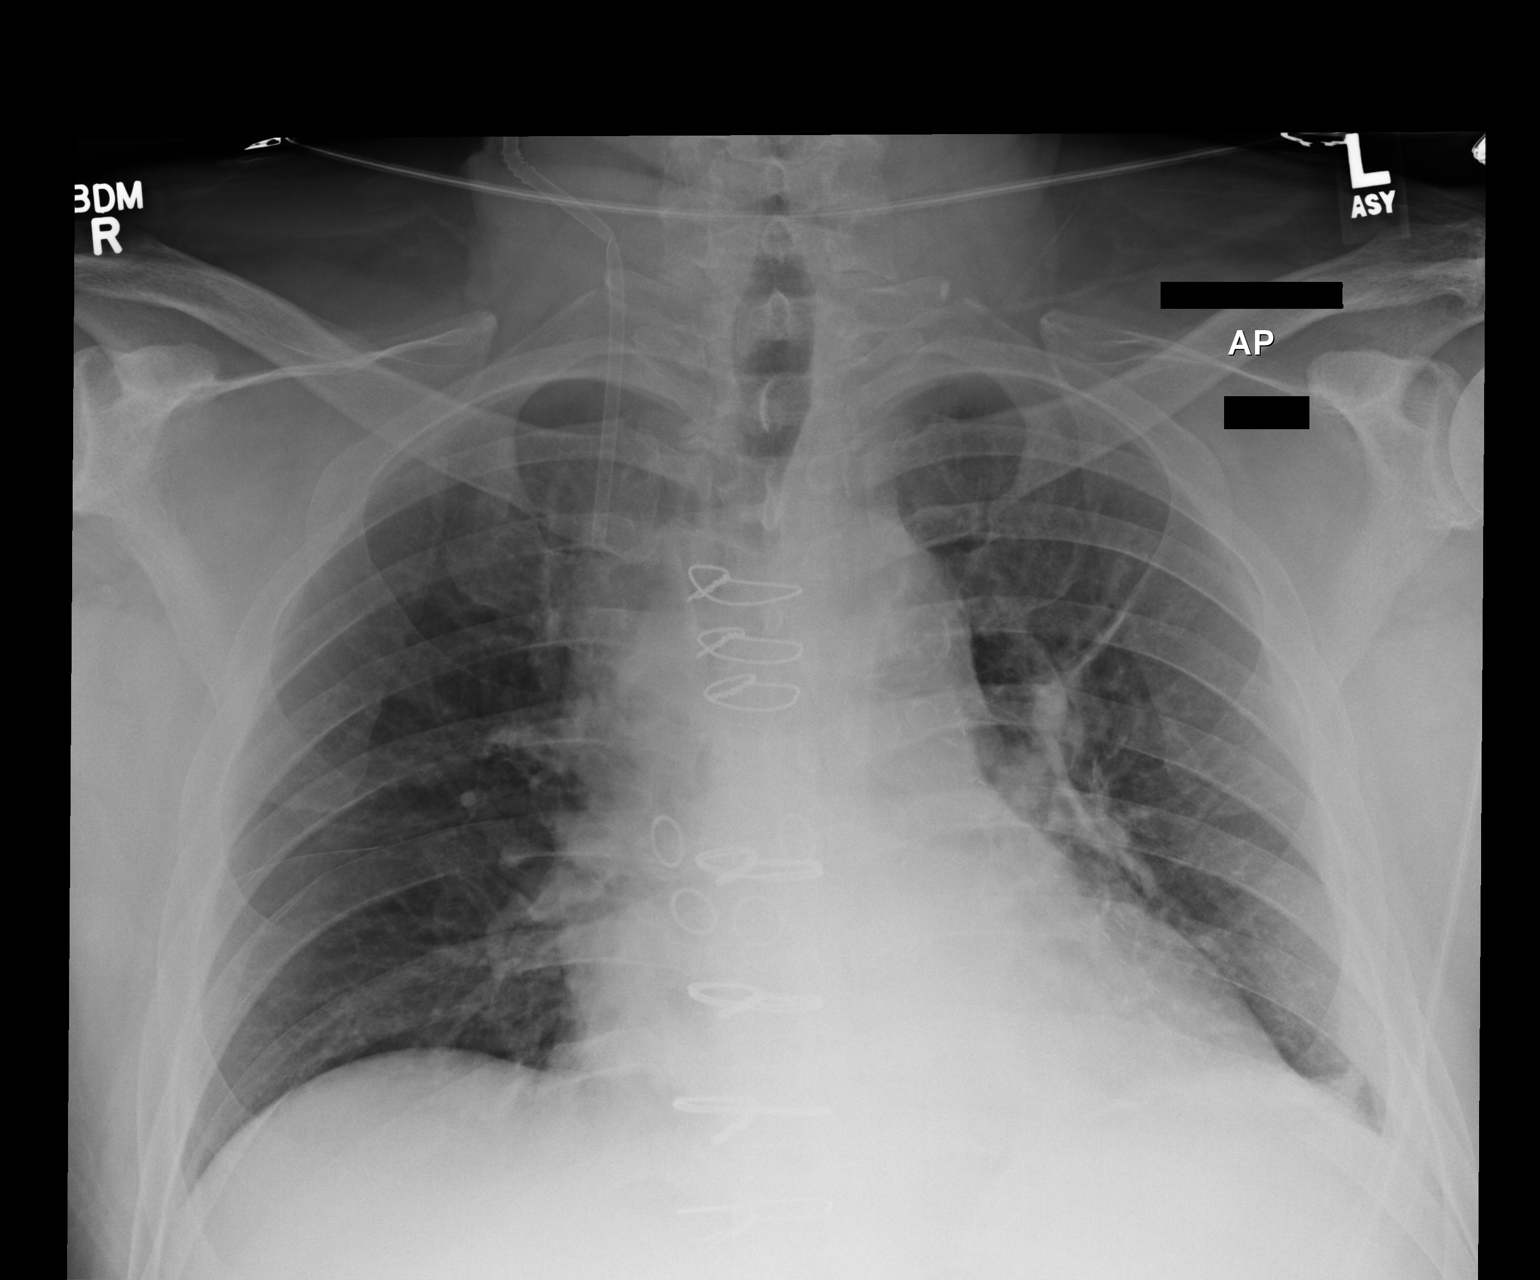

[1 of 1 positions shown; findings below may reference images not displayed]

FINDINGS: Mild cardiomegaly stable.  Both lungs are clear.  No
evidence of pleural effusion.  Right jugular Cordis remains in
place.  Prior CABG again noted.
IMPRESSION: Mild cardiomegaly.  No active lung disease.

## 2013-09-18 ENCOUNTER — Other Ambulatory Visit: Payer: Self-pay

## 2013-11-05 ENCOUNTER — Encounter: Payer: Self-pay | Admitting: Surgery

## 2013-11-07 ENCOUNTER — Other Ambulatory Visit: Payer: Self-pay | Admitting: Surgery

## 2013-11-07 LAB — CREATININE, SERUM: Creat: 1.14 mg/dL (ref 0.50–1.35)

## 2013-11-10 ENCOUNTER — Encounter: Payer: Self-pay | Admitting: Surgery

## 2013-11-10 ENCOUNTER — Ambulatory Visit
Admission: RE | Admit: 2013-11-10 | Discharge: 2013-11-10 | Disposition: A | Discharge status: Home / Self Care | Payer: Medicare Other | Source: Ambulatory Visit | Attending: Surgery | Admitting: Surgery

## 2013-11-10 ENCOUNTER — Ambulatory Visit (INDEPENDENT_AMBULATORY_CARE_PROVIDER_SITE_OTHER): Payer: Medicare Other | Admitting: Surgery

## 2013-11-10 VITALS — BP 141/78 | HR 56 | Ht 70.0 in | Wt 204.0 lb

## 2013-11-10 DIAGNOSIS — I714 Abdominal aortic aneurysm, without rupture, unspecified: Secondary | ICD-10-CM

## 2013-11-10 DIAGNOSIS — Z48812 Encounter for surgical aftercare following surgery on the circulatory system: Secondary | ICD-10-CM

## 2013-11-10 MED ORDER — IOHEXOL 350 MG/ML SOLN
80.0000 mL | Freq: Once | INTRAVENOUS | Status: AC | PRN
  Administered 2013-11-10: 80 mL via INTRAVENOUS

## 2013-11-10 NOTE — Progress Notes (Signed)
Patient name: Garrett Jordan. MRN: [id number] DOB: February 01, 1941 Sex: male     Chief Complaint  Patient presents with  . Re-evaluation    5 month f/u with CTA abd/pelvis prior    HISTORY OF PRESENT ILLNESS: The patient is back today for followup of his abdominal aortic aneurysm.  He initially underwent coil embolization of his right hypogastric artery.  He developed severe buttock claudication therefore his aneurysm repair was delayed.  His symptoms of claudication gradually improved and on 08/08/2012 he underwent endovascular repair of his aneurysm.  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  He comes back for CT scan today he has no complaints.  I tried to get ultrasound of his aneurysm several times in the results did not correlate with the CT scan.  He does report occasional claudication type symptoms but they do not occur with consistency.  He is back playing tennis.  Past Medical History  Diagnosis Date  . AAA (abdominal aortic aneurysm)   . CAD (coronary artery disease)   . Hypertension   . Hyperlipidemia   . Thyroid disease     hypothyroidism  . Reflux   . Gout   . Nerve compression     right leg  . Hypothyroidism   . Seasonal allergies   . GERD (gastroesophageal reflux disease)   . Myocardial infarction 01/1991    sp inferior  . Atrial fibrillation   . Kidney stone     right kidney  . Cancer     prostate  . Arthritis     knees  . 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 Parathyroid adenoma    . Spine surgery    . Kidney stone surgery    . Tonsillectomy    . Prostate surgery      rad seeds  . Coronary stent placement  1992 and  2009    RCA  . Abdominal aortagram embolization  02/28/2012  . Coronary artery bypass graft  04/2009  . Abdominal aortic aneurysm repair  Sept. 2013    History   Social History  . Marital  Status: Divorced    Spouse Name: N/A    Number of Children: N/A  . Years of Education: N/A   Occupational History  . Not on file.   Social History Main Topics  . Smoking status: Former Smoker    Types: Cigarettes    Quit date: 08/01/1970  . Smokeless tobacco: Never Used  . Alcohol Use: 6.0 oz/week    10 Glasses of wine per week     Comment: 1-3 glasses 3-4 times a week  . Drug Use: No  . Sexual Activity: Not on file   Other Topics Concern  . Not on file   Social History Narrative  . No narrative on file    Family History  Problem Relation Age of Onset  . Heart disease Father     Heart Disease before age 71  . Heart attack Father   . Hyperlipidemia Father   . Hypertension Father   . Stroke Mother   . Deep vein thrombosis Mother   . Heart disease Sister   . Diabetes Sister   . Hyperlipidemia Sister   . Hypertension Sister   . Heart attack Sister   . Heart disease Paternal Uncle   . Diabetes Maternal  Grandmother   . Diabetes Sister     Allergies as of 11/10/2013 - Review Complete 11/10/2013  Allergen Reaction Noted  . Ace inhibitors Cough 07/03/2011  . Lopressor [metoprolol tartrate] Cough 07/03/2011  . Nitroglycerin Other (See Comments) 08/21/2011    Current Outpatient Prescriptions on File Prior to Visit  Medication Sig Dispense Refill  . acetaminophen (TYLENOL) 650 MG CR tablet Take 650 mg by mouth 2 (two) times daily as needed. For pain      . allopurinol (ZYLOPRIM) 300 MG tablet Take 300 mg by mouth daily at 6 PM.       . amLODipine (NORVASC) 5 MG tablet Take 5 mg by mouth every morning.       Marland Kitchen aspirin EC 81 MG tablet Take 81 mg by mouth daily at 6 PM.      . clonazePAM (KLONOPIN) 1 MG tablet Take 0.5 mg by mouth at bedtime.       Marland Kitchen diltiazem (CARDIZEM CD) 240 MG 24 hr capsule Take 240 mg by mouth daily at 6 PM.       . famotidine (PEPCID) 20 MG tablet Take 20 mg by mouth daily at 6 PM.       . fluticasone (FLONASE) 50 MCG/ACT nasal spray Place 2 sprays  into the nose daily as needed. Nasal allergies      . levothyroxine (SYNTHROID, LEVOTHROID) 150 MCG tablet Take 0.137 mcg by mouth daily.       Marland Kitchen loratadine-pseudoephedrine (CLARITIN-D 24-HOUR) 10-240 MG per 24 hr tablet Take 1 tablet by mouth every morning.       . metoprolol succinate (TOPROL-XL) 50 MG 24 hr tablet Take 50 mg by mouth every morning. Take with or immediately following a meal.      . Multiple Vitamin (MULTIVITAMIN WITH MINERALS) TABS Take 1 tablet by mouth daily.      Marland Kitchen olmesartan-hydrochlorothiazide (BENICAR HCT) 40-25 MG per tablet Take 1 tablet by mouth every morning.       . Omega-3 Fatty Acids (FISH OIL) 1200 MG CAPS Take 1,200 mg by mouth 2 (two) times daily.      . rosuvastatin (CRESTOR) 10 MG tablet Take 10 mg by mouth every other day.       . Tamsulosin HCl (FLOMAX) 0.4 MG CAPS Take 0.4 mg by mouth every other day.       . vitamin B-12 (CYANOCOBALAMIN) 1000 MCG tablet Take 5,000 mcg by mouth daily at 6 PM.      . bismuth subsalicylate (PEPTO BISMOL) 262 MG/15ML suspension Take 15 mLs by mouth every 6 (six) hours as needed.      . chlorhexidine (PERIDEX) 0.12 % solution       . Glucose Blood (FREESTYLE TEST VI)       . niacin (NIASPAN) 1000 MG CR tablet Take 1,000 mg by mouth daily at 6 PM.       . oxyCODONE (ROXICODONE) 5 MG immediate release tablet Take 1 tablet (5 mg total) by mouth every 6 (six) hours as needed for pain.  30 tablet  0   No current facility-administered medications on file prior to visit.     REVIEW OF SYSTEMS: Please see history of present illness, otherwise all systems negative  PHYSICAL EXAMINATION:   Vital signs are BP 141/78  Pulse 56  Ht 5\' 10"  (1.778 m)  Wt 204 lb (92.534 kg)  BMI 29.27 kg/m2  SpO2 96% General: The patient appears their stated age. HEENT:  No gross abnormalities Pulmonary:  Non  labored breathing Musculoskeletal: There are no major deformities. Neurologic: No focal weakness or paresthesias are detected, Skin: There  are no ulcer or rashes noted. Psychiatric: The patient has normal affect.    Diagnostic Studies CT angiogram was ordered and reviewed.  There is a persistent type II endoleak.  Aneurysm sac remained stable with maximum diameter of 5.6 cm  Assessment: Status post endovascular aneurysm repair Plan: The patient has a persistent type II endoleak.  Has not had any significant growth in the size of his aneurysm.  I've recommended continued surveillance.  Ultrasound studies have not been reliable and so I will repeat his CT scan.  Eldridge Abrahams, M.D. [facility] [place] Office: [phone] Pager:  [phone]

## 2013-11-10 NOTE — Addendum Note (Signed)
Addended by: Mena Goes on: 11/10/2013 02:38 PM   Modules accepted: Orders

## 2013-11-14 ENCOUNTER — Encounter (INDEPENDENT_AMBULATORY_CARE_PROVIDER_SITE_OTHER): Payer: Self-pay

## 2013-11-22 IMAGING — CR DG ABDOMEN 1V
1 series · 1 of 1 positions shown · non-contrast
Comparison: 12/13/2002

CLINICAL DATA: Pre  litho

ABDOMEN - 1 VIEW

[t abdomen supine]
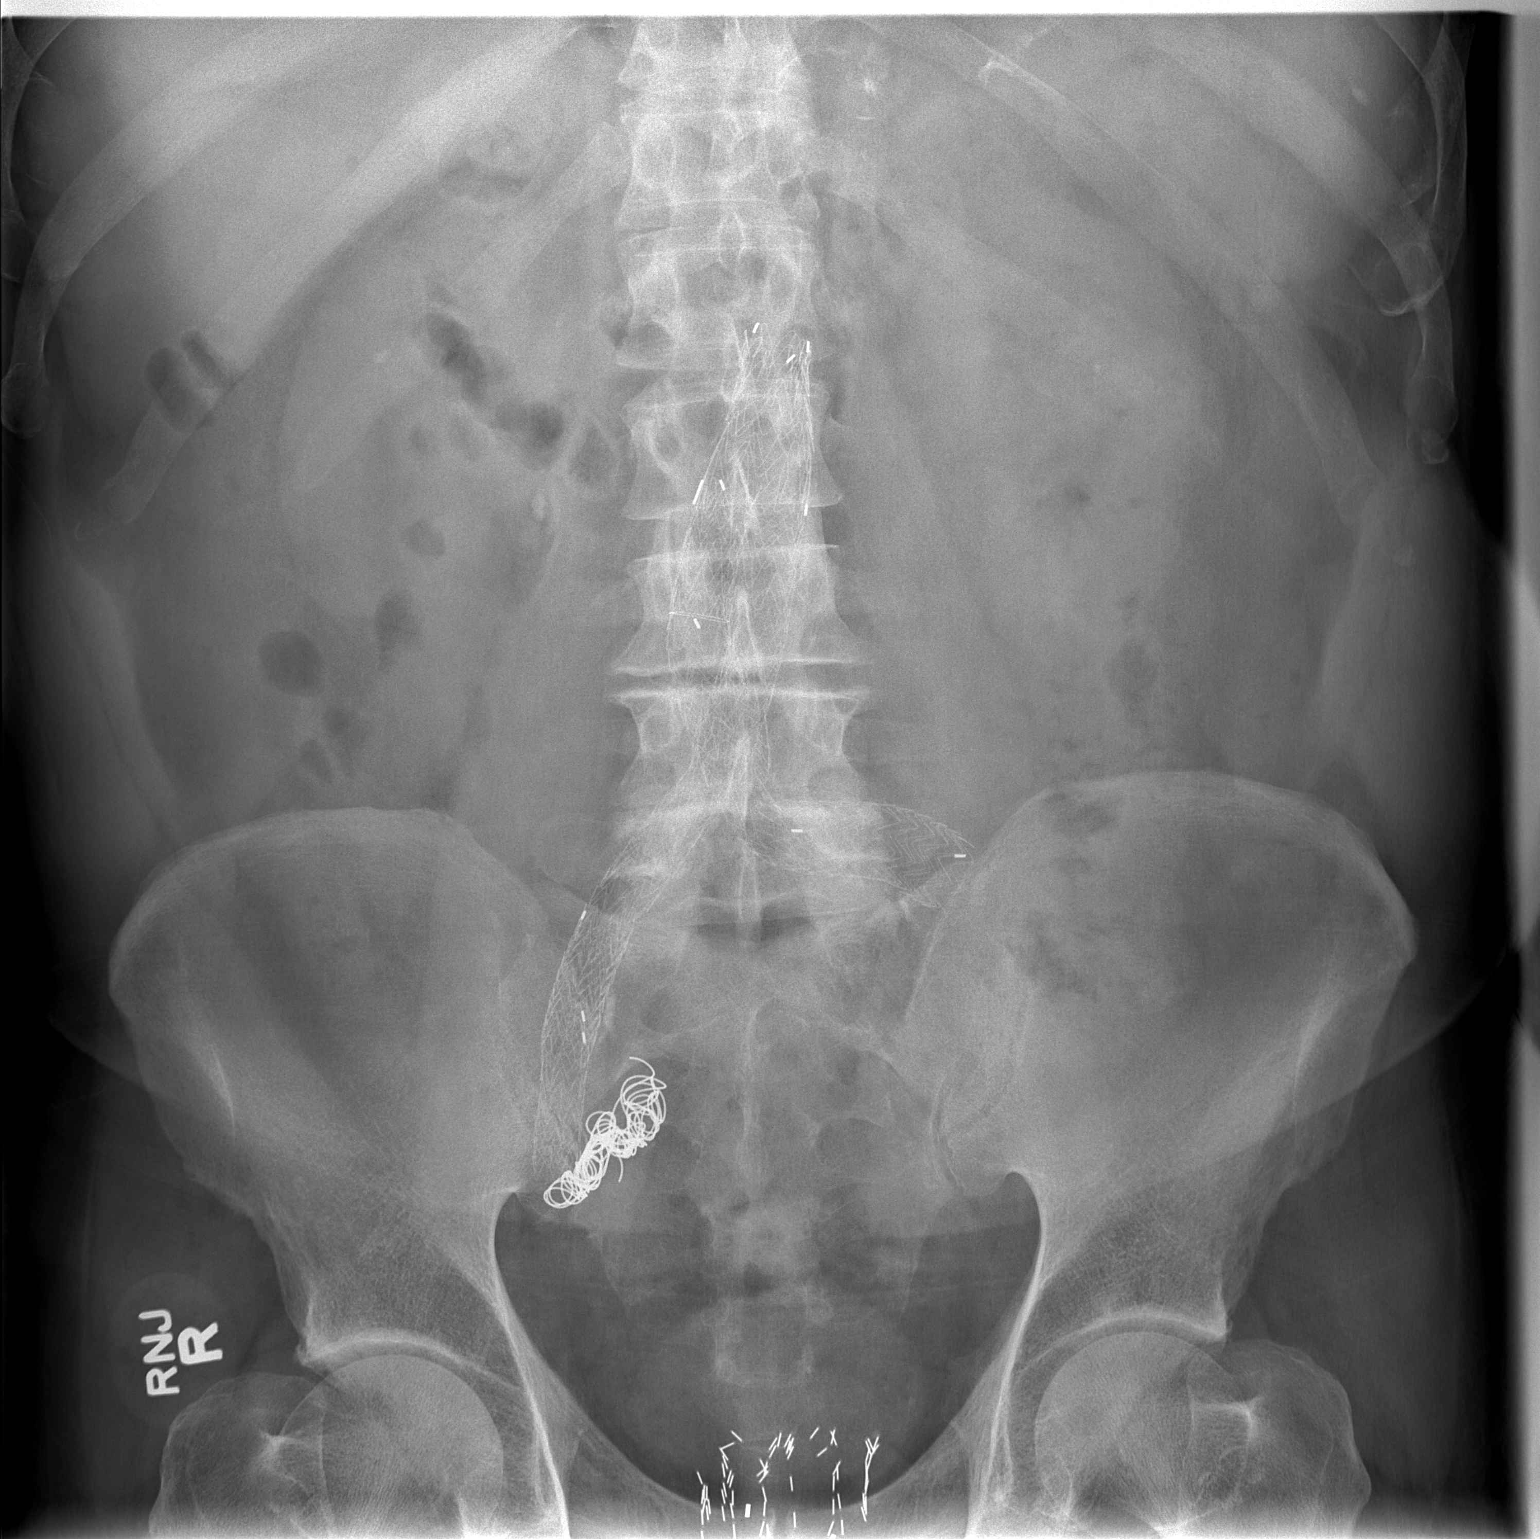

[1 of 1 positions shown; findings below may reference images not displayed]

FINDINGS: 3 x 6 mm stone in the proximal right ureter is unchanged
from the prior study.  3 mm right upper pole stone is unchanged.
Small left renal calculi are present.

Aortic stent graft is noted.  Embolization coils in the right
hypogastric artery.  Prostate seeds are present.  Disc degeneration
L3-4 and  L4-5. Normal bowel gas pattern.
IMPRESSION: 3 x 6 mm stone in the proximal right ureter is unchanged.  Small
bilateral renal calculi.

## 2014-03-29 ENCOUNTER — Emergency Department (HOSPITAL_COMMUNITY): Payer: Medicare Other

## 2014-03-29 ENCOUNTER — Encounter (HOSPITAL_COMMUNITY): Payer: Self-pay | Admitting: Emergency Medicine

## 2014-03-29 ENCOUNTER — Emergency Department (HOSPITAL_COMMUNITY)
Admission: EM | Admit: 2014-03-29 | Discharge: 2014-03-30 | Disposition: A | Discharge status: Home / Self Care | Payer: Medicare Other | Attending: Emergency Medicine | Admitting: Emergency Medicine

## 2014-03-29 DIAGNOSIS — I4891 Unspecified atrial fibrillation: Secondary | ICD-10-CM | POA: Insufficient documentation

## 2014-03-29 DIAGNOSIS — R5383 Other fatigue: Principal | ICD-10-CM

## 2014-03-29 DIAGNOSIS — E039 Hypothyroidism, unspecified: Secondary | ICD-10-CM | POA: Insufficient documentation

## 2014-03-29 DIAGNOSIS — Z8546 Personal history of malignant neoplasm of prostate: Secondary | ICD-10-CM | POA: Insufficient documentation

## 2014-03-29 DIAGNOSIS — E119 Type 2 diabetes mellitus without complications: Secondary | ICD-10-CM | POA: Insufficient documentation

## 2014-03-29 DIAGNOSIS — I252 Old myocardial infarction: Secondary | ICD-10-CM | POA: Insufficient documentation

## 2014-03-29 DIAGNOSIS — Z87442 Personal history of urinary calculi: Secondary | ICD-10-CM | POA: Insufficient documentation

## 2014-03-29 DIAGNOSIS — I2510 Atherosclerotic heart disease of native coronary artery without angina pectoris: Secondary | ICD-10-CM | POA: Insufficient documentation

## 2014-03-29 DIAGNOSIS — R1013 Epigastric pain: Secondary | ICD-10-CM | POA: Insufficient documentation

## 2014-03-29 DIAGNOSIS — K219 Gastro-esophageal reflux disease without esophagitis: Secondary | ICD-10-CM | POA: Insufficient documentation

## 2014-03-29 DIAGNOSIS — R110 Nausea: Secondary | ICD-10-CM

## 2014-03-29 DIAGNOSIS — M109 Gout, unspecified: Secondary | ICD-10-CM | POA: Insufficient documentation

## 2014-03-29 DIAGNOSIS — R5381 Other malaise: Secondary | ICD-10-CM | POA: Insufficient documentation

## 2014-03-29 DIAGNOSIS — Z87891 Personal history of nicotine dependence: Secondary | ICD-10-CM | POA: Insufficient documentation

## 2014-03-29 DIAGNOSIS — Z79899 Other long term (current) drug therapy: Secondary | ICD-10-CM | POA: Insufficient documentation

## 2014-03-29 DIAGNOSIS — Z8709 Personal history of other diseases of the respiratory system: Secondary | ICD-10-CM | POA: Insufficient documentation

## 2014-03-29 DIAGNOSIS — Z951 Presence of aortocoronary bypass graft: Secondary | ICD-10-CM | POA: Insufficient documentation

## 2014-03-29 DIAGNOSIS — Z9861 Coronary angioplasty status: Secondary | ICD-10-CM | POA: Insufficient documentation

## 2014-03-29 DIAGNOSIS — Z7982 Long term (current) use of aspirin: Secondary | ICD-10-CM | POA: Insufficient documentation

## 2014-03-29 DIAGNOSIS — I10 Essential (primary) hypertension: Secondary | ICD-10-CM | POA: Insufficient documentation

## 2014-03-29 LAB — CBC WITH DIFFERENTIAL/PLATELET
Basophils Absolute: 0 10*3/uL (ref 0.0–0.1)
Basophils Relative: 0 % (ref 0–1)
Eosinophils Absolute: 0.1 10*3/uL (ref 0.0–0.7)
Eosinophils Relative: 1 % (ref 0–5)
HCT: 42.3 % (ref 39.0–52.0)
HEMOGLOBIN: 15.1 g/dL (ref 13.0–17.0)
LYMPHS ABS: 1.6 10*3/uL (ref 0.7–4.0)
LYMPHS PCT: 33 % (ref 12–46)
MCH: 31.8 pg (ref 26.0–34.0)
MCHC: 35.7 g/dL (ref 30.0–36.0)
MCV: 89.1 fL (ref 78.0–100.0)
MONOS PCT: 9 % (ref 3–12)
Monocytes Absolute: 0.4 10*3/uL (ref 0.1–1.0)
NEUTROS ABS: 2.8 10*3/uL (ref 1.7–7.7)
NEUTROS PCT: 57 % (ref 43–77)
PLATELETS: 192 10*3/uL (ref 150–400)
RBC: 4.75 MIL/uL (ref 4.22–5.81)
RDW: 12.5 % (ref 11.5–15.5)
WBC: 4.8 10*3/uL (ref 4.0–10.5)

## 2014-03-29 LAB — COMPREHENSIVE METABOLIC PANEL
ALK PHOS: 94 U/L (ref 39–117)
ALT: 41 U/L (ref 0–53)
AST: 26 U/L (ref 0–37)
Albumin: 4.1 g/dL (ref 3.5–5.2)
BILIRUBIN TOTAL: 0.4 mg/dL (ref 0.3–1.2)
BUN: 17 mg/dL (ref 6–23)
CHLORIDE: 99 meq/L (ref 96–112)
CO2: 24 meq/L (ref 19–32)
Calcium: 10.5 mg/dL (ref 8.4–10.5)
Creatinine, Ser: 1.35 mg/dL (ref 0.50–1.35)
GFR calc non Af Amer: 51 mL/min — ABNORMAL LOW (ref >90)
GFR, EST AFRICAN AMERICAN: 59 mL/min — AB (ref >90)
GLUCOSE: 100 mg/dL — AB (ref 70–99)
POTASSIUM: 3.4 meq/L — AB (ref 3.7–5.3)
SODIUM: 138 meq/L (ref 137–147)
TOTAL PROTEIN: 7.3 g/dL (ref 6.0–8.3)

## 2014-03-29 LAB — URINALYSIS, ROUTINE W REFLEX MICROSCOPIC
Bilirubin Urine: NEGATIVE
GLUCOSE, UA: NEGATIVE mg/dL
HGB URINE DIPSTICK: NEGATIVE
KETONES UR: NEGATIVE mg/dL
LEUKOCYTES UA: NEGATIVE
Nitrite: NEGATIVE
PH: 7 (ref 5.0–8.0)
Protein, ur: NEGATIVE mg/dL
Specific Gravity, Urine: 1.009 (ref 1.005–1.030)
Urobilinogen, UA: 0.2 mg/dL (ref 0.0–1.0)

## 2014-03-29 LAB — I-STAT TROPONIN, ED
Troponin i, poc: 0 ng/mL (ref 0.00–0.08)
Troponin i, poc: 0.01 ng/mL (ref 0.00–0.08)

## 2014-03-29 LAB — LIPASE, BLOOD: LIPASE: 78 U/L — AB (ref 11–59)

## 2014-03-29 MED ORDER — ONDANSETRON HCL 4 MG/2ML IJ SOLN
4.0000 mg | Freq: Once | INTRAMUSCULAR | Status: AC
  Administered 2014-03-29: 4 mg via INTRAVENOUS
  Filled 2014-03-29: qty 2

## 2014-03-29 MED ORDER — IOHEXOL 350 MG/ML SOLN
100.0000 mL | Freq: Once | INTRAVENOUS | Status: AC | PRN
  Administered 2014-03-29: 100 mL via INTRAVENOUS

## 2014-03-29 NOTE — ED Provider Notes (Signed)
CSN: [id number]     Arrival date & time 03/29/14  1941 History   First MD Initiated Contact with Patient 03/29/14 2036     Chief Complaint  Patient presents with  . Fatigue  . Abdominal Pain     (Consider location/radiation/quality/duration/timing/severity/associated sxs/prior Treatment) Patient is a 73 y.o. male presenting with general illness.  Illness Quality:  Fatigue, nausea, epigastric discomfort Severity:  Moderate Onset quality:  Gradual Duration:  1 day Timing:  Constant Progression:  Unchanged Chronicity:  New Context:  History of CABG and AAA repair Relieved by:  Nothing Worsened by:  Nothing Associated symptoms: fatigue and nausea   Associated symptoms: no chest pain, no diarrhea, no fever and no shortness of breath     Past Medical History  Diagnosis Date  . AAA (abdominal aortic aneurysm)   . CAD (coronary artery disease)   . Hypertension   . Hyperlipidemia   . Thyroid disease     hypothyroidism  . Reflux   . Gout   . Nerve compression     right leg  . Hypothyroidism   . Seasonal allergies   . GERD (gastroesophageal reflux disease)   . Myocardial infarction 01/1991    sp inferior  . Atrial fibrillation   . Kidney stone     right kidney  . Cancer     prostate  . Arthritis     knees  . 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 Parathyroid adenoma    . Spine surgery    . Kidney stone surgery    . Tonsillectomy    . Prostate surgery      rad seeds  . Coronary stent placement  1992 and  2009    RCA  . Abdominal aortagram embolization  02/28/2012  . Coronary artery bypass graft  04/2009  . Abdominal aortic aneurysm repair  Sept. 2013   Family History  Problem Relation Age of Onset  . Heart disease Father     Heart Disease before age 1  . Heart attack Father   . Hyperlipidemia Father   . Hypertension Father   . Stroke Mother   . Deep vein thrombosis Mother   .  Heart disease Sister   . Diabetes Sister   . Hyperlipidemia Sister   . Hypertension Sister   . Heart attack Sister   . Heart disease Paternal Uncle   . Diabetes Maternal Grandmother   . Diabetes Sister    History  Substance Use Topics  . Smoking status: Former Smoker    Types: Cigarettes    Quit date: 08/01/1970  . Smokeless tobacco: Never Used  . Alcohol Use: 6.0 oz/week    10 Glasses of wine per week     Comment: 1-3 glasses 3-4 times a week    Review of Systems  Constitutional: Positive for fatigue. Negative for fever.  Respiratory: Negative for shortness of breath.   Cardiovascular: Negative for chest pain.  Gastrointestinal: Positive for nausea. Negative for diarrhea.  All other systems reviewed and are negative.     Allergies  Ace inhibitors; Crestor; and Nitroglycerin  Home Medications   Prior to Admission medications   Medication Sig Start Date End Date Taking? Authorizing Provider  acetaminophen (TYLENOL) 650 MG CR tablet Take 650 mg by mouth daily as needed for pain.    Yes Historical Provider, MD  allopurinol (ZYLOPRIM) 300 MG tablet Take 300 mg by mouth every  other day. Take in the evening - on even days of the month   Yes Historical Provider, MD  amLODipine (NORVASC) 5 MG tablet Take 5 mg by mouth daily.    Yes Historical Provider, MD  aspirin EC 81 MG tablet Take 81 mg by mouth at bedtime.    Yes Historical Provider, MD  Cholecalciferol (VITAMIN D3) 2000 UNITS TABS Take 2,000 Units by mouth daily.    Yes Historical Provider, MD  clonazePAM (KLONOPIN) 1 MG tablet Take 0.5 mg by mouth at bedtime.    Yes Historical Provider, MD  diltiazem (CARDIZEM CD) 240 MG 24 hr capsule Take 240 mg by mouth at bedtime.    Yes Historical Provider, MD  famotidine (PEPCID) 20 MG tablet Take 20 mg by mouth at bedtime.    Yes Historical Provider, MD  fluticasone (FLONASE) 50 MCG/ACT nasal spray Place 2 sprays into both nostrils daily as needed for allergies.    Yes Historical  Provider, MD  ibuprofen (ADVIL,MOTRIN) 200 MG tablet Take 400 mg by mouth 2 (two) times daily as needed (pain).   Yes Historical Provider, MD  levothyroxine (SYNTHROID, LEVOTHROID) 137 MCG tablet Take 137 mcg by mouth daily before breakfast.   Yes Historical Provider, MD  loratadine-pseudoephedrine (CLARITIN-D 24-HOUR) 10-240 MG per 24 hr tablet Take 1 tablet by mouth at bedtime.    Yes Historical Provider, MD  metoprolol succinate (TOPROL-XL) 50 MG 24 hr tablet Take 50 mg by mouth daily. Take with or immediately following a meal.   Yes Historical Provider, MD  Multiple Vitamin (MULTIVITAMIN WITH MINERALS) TABS Take 1 tablet by mouth at bedtime.    Yes Historical Provider, MD  Multiple Vitamins-Minerals (PRESERVISION AREDS 2) CAPS Take 1 capsule by mouth 2 (two) times daily.   Yes Historical Provider, MD  olmesartan-hydrochlorothiazide (BENICAR HCT) 40-25 MG per tablet Take 1 tablet by mouth daily.    Yes Historical Provider, MD  Omega-3 Fatty Acids (FISH OIL) 1200 MG CAPS Take 1,200 mg by mouth 2 (two) times daily.   Yes Historical Provider, MD  Tamsulosin HCl (FLOMAX) 0.4 MG CAPS Take 0.4 mg by mouth every other day. Take in the evening - on even days of the month   Yes Historical Provider, MD  triamcinolone cream (KENALOG) 0.1 % Apply 1 application topically 4 (four) times daily as needed (poison ivy rash).  03/20/14  Yes Historical Provider, MD  vitamin B-12 (CYANOCOBALAMIN) 1000 MCG tablet Take 1,000 mcg by mouth at bedtime.    Yes Historical Provider, MD  Glucose Blood (FREESTYLE TEST VI)  04/03/13   Historical Provider, MD   BP 145/89  Pulse 75  Temp(Src) 97.5 F (36.4 C) (Oral)  Resp 18  Ht 5\' 10"  (1.778 m)  Wt 198 lb (89.812 kg)  BMI 28.41 kg/m2  SpO2 98% Physical Exam  Nursing note and vitals reviewed. Constitutional: He is oriented to person, place, and time. He appears well-developed and well-nourished. No distress.  HENT:  Head: Normocephalic and atraumatic.  Mouth/Throat:  Oropharynx is clear and moist.  Eyes: Conjunctivae are normal. Pupils are equal, round, and reactive to light. No scleral icterus.  Neck: Neck supple.  Cardiovascular: Normal rate, regular rhythm, normal heart sounds and intact distal pulses.   No murmur heard. Pulmonary/Chest: Effort normal and breath sounds normal. No stridor. No respiratory distress. He has no wheezes. He has no rales.  Abdominal: Soft. He exhibits no distension. There is tenderness (mild) in the epigastric area.  Musculoskeletal: Normal range of motion. He exhibits  no edema.  Neurological: He is alert and oriented to person, place, and time.  Skin: Skin is warm and dry. No rash noted.  Psychiatric: He has a normal mood and affect. His behavior is normal.    ED Course  Procedures (including critical care time) Labs Review Labs Reviewed  COMPREHENSIVE METABOLIC PANEL - Abnormal; Notable for the following:    Potassium 3.4 (*)    Glucose, Bld 100 (*)    GFR calc non Af Amer 51 (*)    GFR calc Af Amer 59 (*)    All other components within normal limits  LIPASE, BLOOD - Abnormal; Notable for the following:    Lipase 78 (*)    All other components within normal limits  CBC WITH DIFFERENTIAL  URINALYSIS, ROUTINE W REFLEX MICROSCOPIC  I-STAT TROPOININ, ED    Imaging Review Ct Angio Abd/pel W/ And/or W/o  03/30/2014   CLINICAL DATA:  Generalized weakness. 1 week of intermittent nausea. Upper abdominal pressure.  EXAM: CTA ABDOMEN AND PELVIS wITHOUT AND WITH CONTRAST  TECHNIQUE: Multidetector CT imaging of the abdomen and pelvis was performed using the standard protocol during bolus administration of intravenous contrast. Multiplanar reconstructed images and MIPs were obtained and reviewed to evaluate the vascular anatomy.  CONTRAST:  135mL OMNIPAQUE IOHEXOL 350 MG/ML SOLN  COMPARISON:  CTA of the abdomen and pelvis 11/10/2013  FINDINGS: Lung Bases: Areas of mild atelectasis and/or scarring are noted throughout the lung  bases bilaterally.  Abdomen/Pelvis: Aorto bi-iliac endograft noted. The excluded aneurysm sac appears stable to slightly decreased in size compared to the prior study, currently measuring 5.6 x 4.5 cm (image 81 of series 4). There is again a blush of contrast material within the excluded aneurysm sac, compatible with endoleak, likely related to patent inferior mesenteric artery and lumbar perforators (i.e., type 2).  Single renal arteries bilaterally are both widely patent. Celiac axis and superior mesenteric arteries are both widely patent without hemodynamically significant stenosis. Inferior mesenteric artery is patent. Both iliac lumens of the aorto bi-iliac endograft are widely patent. Mild aneurysmal dilatation of the distal left common iliac artery beyond the graft measuring up to 2.2 cm in diameter, similar to the prior study.  High attenuation material layering dependently within the lumen of the gallbladder compatible with a combination of small stones and/or biliary sludge. No current findings to suggest acute cholecystitis at this time. The appearance of the liver, pancreas, spleen and bilateral adrenal glands is unremarkable. No significant volume of ascites. No pneumoperitoneum. No pathologic distention of small bowel. Normal appendix. No definite lymphadenopathy identified within the abdomen or pelvis. Brachytherapy beads within the prostate gland. Urinary bladder is unremarkable in appearance.  Musculoskeletal: There are no aggressive appearing lytic or blastic lesions noted in the visualized portions of the skeleton. Multilevel degenerative disc disease, most severe at L3-L4 and L4-L5.  Review of the MIP images confirms the above findings.  IMPRESSION: 1. 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 2. No acute findings on today's study. 3. Sludge and/or small gallstones in the gallbladder layering  dependently. No current findings to suggest acute cholecystitis at this time. 4. Additional incidental findings, as detailed above.   Electronically Signed   By: Vinnie Langton M.D.   On: 03/30/2014 00:01  All radiology studies independently viewed by me.      EKG Interpretation   Date/Time:  Sunday Mar 29 2014 19:54:37 EDT Ventricular  Rate:  82 PR Interval:  164 QRS Duration: 154 QT Interval:  420 QTC Calculation: 490 R Axis:   49 Text Interpretation:  Normal sinus rhythm Right bundle branch block  Abnormal ECG No significant change was found Confirmed by [facility]  MD,  TREY ([id number]) on 03/29/2014 9:29:17 PM      MDM   Final diagnoses:  Nausea  Malaise    73 year old male with a history of CAD and AAA status post stent presenting with malaise, fatigue, severe nausea without vomiting, and epigastric discomfort. He is concerned about his heart and his aorta. Symptoms different than his prior heart attacks. EKG with right bundle branch block but no significant change. Initial troponin negative. He is generally well appearing, but given subtle symptoms and epigastric discomfort in the setting of a stent for AAA, plan CTA to further evaluate.  CTA negative.  Delta trop negative.  Pt felt better after zofran.  Plan DC home with PCP follow up.     Arbie Cookey, MD 03/30/14 [phone]

## 2014-03-29 NOTE — ED Notes (Signed)
Pt reports generalized weakness x 1 week with intermittent nausea. States today started having upper abdominal pressure. Denies CP, back pain, diaphoresis. Hx: AAA with stent, CABG

## 2014-03-30 MED ORDER — ONDANSETRON HCL 4 MG PO TABS: 4.0000 mg | ORAL_TABLET | Freq: Four times a day (QID) | ORAL | Status: DC

## 2014-03-30 NOTE — Discharge Instructions (Signed)
Nausea, Adult Nausea is the feeling that you have an upset stomach or have to vomit. Nausea by itself is not likely a serious concern, but it may be an early sign of more serious medical problems. As nausea gets worse, it can lead to vomiting. If vomiting develops, there is the risk of dehydration.  CAUSES   Viral infections.  Food poisoning.  Medicines.  Pregnancy.  Motion sickness.  Migraine headaches.  Emotional distress.  Severe pain from any source.  Alcohol intoxication. HOME CARE INSTRUCTIONS  Get plenty of rest.  Ask your caregiver about specific rehydration instructions.  Eat small amounts of food and sip liquids more often.  Take all medicines as told by your caregiver. SEEK MEDICAL CARE IF:  You have not improved after 2 days, or you get worse.  You have a headache. SEEK IMMEDIATE MEDICAL CARE IF:   You have a fever.  You faint.  You keep vomiting or have blood in your vomit.  You are extremely weak or dehydrated.  You have dark or bloody stools.  You have severe chest or abdominal pain. MAKE SURE YOU:  Understand these instructions.  Will watch your condition.  Will get help right away if you are not doing well or get worse. Document Released: 12/07/2004 Document Revised: 07/24/2012 Document Reviewed: 07/12/2011 [employer] Patient Information 2014 [employer].  

## 2014-04-07 ENCOUNTER — Encounter: Payer: Self-pay | Admitting: Cardiology

## 2014-04-07 NOTE — Progress Notes (Signed)
Patient ID: Garrett Jordan., male   DOB: 04/13/1941, 73 y.o.   MRN: [id number]    Garrett Jordan  Date of visit:  04/07/2014 DOB:  1941-08-12    Age:  14 yrs. Medical record number:  [id number]     Account number:  [id number] Primary Care Provider: Moreen Fowler, DAVID ____________________________ CURRENT DIAGNOSES  1. CAD,Native  2. 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 Arrhythmia-Atrial Fibrillation  4. Surgery-Aortocoronary Bypass Grafting  5. Hyperlipidemia, unspecified  6. MI-S/P Inferior  7. Aneurysm-Abdominal  8. Personal History Of Malignant Neoplasm Of Prostate  9. Stent Placement  10. Hypothyroidism ____________________________ ALLERGIES  Ace Inhibitors, Dry cough  Metoprolol, Dry cough ____________________________ MEDICATIONS  1. Multivitamin Capsule, 1 p.o. daily  2. metoprolol succinate 100 mg Tablet Sustained Release 24 hr, 1 p.o. daily  3. Flomax 0.4 mg Capsule, Ext Release 24 hr, qod  4. diltiazem HCl 240 mg Capsule, Extended Release, 1 p.o. daily  5. amlodipine 5 mg Tablet, 1 p.o. daily  6. nitroglycerin 0.4 mg Tablet, Sublingual, PRN  7. Omega-3 350-235-90-640 mg Capsule, Delayed Release(E.COral, BID  8. Claritin 10 mg Tablet, PRN  9. Vitamin B-12 2,000 mcg tablet extended release, 1 p.o. daily  10. allopurinol 300 mg tablet, 1 p.o. daily  11. fluticasone 50 mcg/actuation Spray, Suspension, PRN  12. clonazepam 0.5 mg tablet, QHS  13. famotidine 20 mg tablet, 1 p.o. daily  14. Benicar HCT 40 mg-25 mg tablet, 1 p.o. daily  15. Vitamin D3 2,000 unit capsule, 1 p.o. daily  16. aspirin 81 mg chewable tablet, 1 p.o. daily  17. PreserVision AREDS 2 250 mg-200 unit-40 mg-1 mg capsule, BID  18. levothyroxine 137 mcg tablet, 1 p.o. daily  19. triamcinolone acetonide 0.1 % topical cream, PRN ____________________________ CHIEF COMPLAINTS  Followup of Arrhythmia-Atrial Fibrillation  Recent er visit ____________________________ HISTORY OF PRESENT  ILLNESS  Patient seen for cardiac followup. He went to the emergency room with some abdominal pain and had a CT scan that showed that his aneurysm had reduced somewhat in size. His cardiac enzymes were negative and EKG was unremarkable. He is feeling better at the present time but was quite anxious at the time he went to the emergency room. He denies angina today. He has no PND, orthopnea or edema. He has been intolerant of multiple statins including pravastatin, simvastatin, atorvastatin, and Crestor even the lowest doses. He would be interested in a trial of the PCSK9 inhibitors. ____________________________ PAST HISTORY  Past Medical Illnesses:  hyperlipidemia, hypertension, hypothyroidism, gout, BPH, AAA-asymptomatic, lumbar disc disease, cholelithiasis, prostate cancer treated with seeds, history of hyperparathyroidism, renal stones;  Cardiovascular Illnesses:  CAD, S/P MI-inferior March 1992, UAP 6/10, post op atrial fibrillation;  Surgical Procedures:  parathyroid adenoma, tonsillectomy, kidney stone removal, resection of villous adenoma, laminectomy lumbar, CABG April 29, 2009 Dr. Nils Pyle;  Cardiology Procedures-Invasive:  stent RCA November 1998, Vision stent RCA October 2006, cardiac cath (left) June 2010, CABG with LIMA to LAD, SVG-PD, SVG-PL, SVG-OM, SVG-RV branch 6/10 Nils Pyle;  Cardiology Procedures-Noninvasive:  treadmill cardiolite June 2009, treadmill August 2010;  Cardiac Cath Results:  normal Left main, Severe ulcerated proximal LAD stenosis, 80% stenosis distal CFX, 40% stenosis proximal RCA, 99% stenosis distal RCA, widely patent mid RCA stent site, Patent distal stent with occluded posterior lateral branch and posterior descending artery, occluded distal right coronary artery with collaterals;  Peripheral Vascular Procedures:  stent graft of abdominal aorta September 2013;  LVEF of 55% documented via cardiac cath on 04/27/2010,  ____________________________ Caleen Essex TEST  DATES EKG Date:  06/24/2012;   Cardiac Cath Date:  04/27/2009;  CABG: 04/29/2009;  Stent Placement Date: 08/21/2005;  Nuclear Study Date:  05/07/2008;  Echocardiography Date: 04/28/2009;  Chest Xray Date: 07/23/2010;   ____________________________ FAMILY HISTORY Brother -- Brother alive and well Brother -- Brother dead, Sudden infant death Brother -- Brother dead, Sudden infant death Father -- Father dead, Coronary Artery Disease Mother -- CVA Sister -- Sister alive and well Sister -- Sister alive and well Sister -- Sister alive with problem, Coronary Artery Disease Sister -- Sister alive and well Sister -- Sister alive with problem, Congestive heart failure ____________________________ SOCIAL HISTORY Alcohol Use:  drinks occasionally;  Smoking:  used to smoke but quit Prior to 1980;  Diet:  regular diet without modifications;  Lifestyle:  divorced;  Exercise:  treadmill;  Occupation:  retired, Surveyor, mining;  Residence:  lives alone;   ____________________________ [place]:  denies recent weight change, fatique or change in exercise tolerance. Eyes: wears eye glasses/contact lenses, denies diplopia, glaucoma or visual field defects. Ears, Nose, Throat, Mouth:  pulsatile tinnitis Respiratory: denies dyspnea, cough, wheezing or hemoptysis. Cardiovascular:  please review HPI Abdominal: denies dyspepsia, GI bleeding, constipation, or diarrhea Genitourinary-Male: recent kidney stones  Musculoskeletal:  restless legs, arthritis of the knees Psychiatric:  insomnia  ____________________________ PHYSICAL EXAMINATION VITAL SIGNS  Blood Pressure:  118/70 Sitting, Right arm, regular cuff  , 120/70 Standing, Right arm and regular cuff   Pulse:  74/min. Weight:  202.00 lbs. Height:  70"BMI: 29  Constitutional:  pleasant white male in no acute distress Skin:  warm and dry to touch, no apparent skin lesions, or masses noted. Head:  normocephalic, normal hair pattern,  no masses or tenderness ENT:  ears, nose and throat reveal no gross abnormalities.  Dentition good. Neck:  healed surgical "collar" scar anterior neck, no bruits Chest:  clear to auscultation, healed median sternotomy scar Cardiac:  regular rhythm, normal S1 and S2, No S3 or S4, no murmurs, gallops or rubs detected. Peripheral Pulses:  the femoral,dorsalis pedis, and posterior tibial pulses are full and equal bilaterally with no bruits auscultated. Extremities & Back:  no edema present Neurological:  no gross motor or sensory deficits noted, affect appropriate, oriented x3. ____________________________ MOST RECENT LIPID PANEL 02/23/14  CHOL TOTL 131 mg/dl, LDL 54 NM, HDL 36 mg/dl, TRIGLYCER 208 mg/dl and CHOL/HDL 3.6 (Calc) ____________________________ IMPRESSIONS/PLAN  1. Coronary artery disease with previous bypass grafting 2. Abdominal aneurysm with previous stent graft 3. Hyperlipidemia with statin intolerance 4. Hypertension  Recommendations:  I will refer him for a consideration of a clinical trial at the PCSK9 inhibitors. Reviewed emergency room record. Follow up otherwise in 6 months. ____________________________ TODAYS ORDERS  1. Return Visit: 6 months  2. 12 Lead EKG: 6 months                       ____________________________ Cardiology Physician:  Kerry Hough MD [facility]

## 2014-04-17 ENCOUNTER — Encounter: Payer: Self-pay | Admitting: Surgery

## 2014-04-20 ENCOUNTER — Telehealth: Payer: Self-pay | Admitting: Surgery

## 2014-04-20 NOTE — Telephone Encounter (Signed)
Message copied by Gena Fray on Mon Apr 20, 2014 11:13 AM ------      Message from: Denman George      Created: Fri Apr 17, 2014  3:47 PM      Regarding: cancel CTA Abd/ Pelvis 7/6       This pt. had a CTA Abd./ Pelvis on 5/18 in the ER, so we can cancel the one scheduled on 05/18/14.  (he sent me a msg on MyChart re: already having the CTA; I told him we would cancel the one attached to his f/u appt. on 7/6) I told him to keep the appt. @ 10:30 AM w/ VWB.  Thanks.  Marland Kitchen  ------

## 2014-05-14 ENCOUNTER — Encounter: Payer: Self-pay | Admitting: Surgery

## 2014-05-18 ENCOUNTER — Encounter: Payer: Self-pay | Admitting: Surgery

## 2014-05-18 ENCOUNTER — Ambulatory Visit (INDEPENDENT_AMBULATORY_CARE_PROVIDER_SITE_OTHER): Payer: Medicare Other | Admitting: Surgery

## 2014-05-18 ENCOUNTER — Other Ambulatory Visit: Payer: Medicare Other

## 2014-05-18 VITALS — BP 148/86 | HR 58 | Ht 70.0 in | Wt 206.8 lb

## 2014-05-18 DIAGNOSIS — I714 Abdominal aortic aneurysm, without rupture, unspecified: Secondary | ICD-10-CM

## 2014-05-18 NOTE — Progress Notes (Signed)
Patient name: Garrett Jordan. MRN: [id number] DOB: [birth year] Sex: male     Chief Complaint  Patient presents with  . Re-evaluation    72 month f/u     HISTORY OF PRESENT ILLNESS: The patient is back today for followup of his abdominal aortic aneurysm. He initially underwent coil embolization of his right hypogastric artery. He developed severe buttock claudication therefore his aneurysm repair was delayed. His symptoms of claudication gradually improved and on 08/08/2012 he underwent endovascular repair of his aneurysm. 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 He comes back for CT scan today he has no complaints. I tried to get ultrasound of his aneurysm several times in the results did not correlate with the CT scan.  He was recently hospitalized for what turned out to be a bout of anxiety.  During that hospitalization his CT scan was performed.  He reports no abdominal pain.   Past Medical History  Diagnosis Date  . AAA (abdominal aortic aneurysm)   . CAD (coronary artery disease)   . Hypertension   . Hyperlipidemia   . Thyroid disease     hypothyroidism  . Reflux   . Gout   . Nerve compression     right leg  . Hypothyroidism   . Seasonal allergies   . GERD (gastroesophageal reflux disease)   . Myocardial infarction 01/1991    sp inferior  . Atrial fibrillation   . Kidney stone     right kidney  . Cancer     prostate  . Arthritis     knees  . 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 Parathyroid adenoma    . Spine surgery    . Kidney stone surgery    . Tonsillectomy    . Prostate surgery      rad seeds  . Coronary stent placement  1992 and  2009    RCA  . Abdominal aortagram embolization  02/28/2012  . Coronary artery bypass graft  04/2009  . Abdominal aortic aneurysm repair  Sept. 2013    History   Social History   . Marital Status: Divorced    Spouse Name: N/A    Number of Children: N/A  . Years of Education: N/A   Occupational History  . Not on file.   Social History Main Topics  . Smoking status: Former Smoker    Types: Cigarettes    Quit date: 08/01/1970  . Smokeless tobacco: Never Used  . Alcohol Use: 3.6 - 5.4 oz/week    4-5 Glasses of wine, 2-4 Cans of beer per week     Comment: 1-3 glasses 3-4 times a week  . Drug Use: No  . Sexual Activity: Not on file   Other Topics Concern  . Not on file   Social History Narrative  . No narrative on file    Family History  Problem Relation Age of Onset  . Heart disease Father     Heart Disease before age 6  . Heart attack Father   . Hyperlipidemia Father   . Hypertension Father   . Stroke Mother   . Deep vein thrombosis Mother   . Heart disease Sister   . Diabetes Sister   . Hyperlipidemia Sister   . Hypertension Sister   . Heart attack Sister   . Heart disease  Paternal Uncle   . Diabetes Maternal Grandmother   . Diabetes Sister     Allergies as of 05/18/2014 - Review Complete 05/18/2014  Allergen Reaction Noted  . Ace inhibitors Cough 07/03/2011  . Crestor [rosuvastatin] Other (See Comments) 03/29/2014  . Nitroglycerin Other (See Comments) 08/21/2011    Current Outpatient Prescriptions on File Prior to Visit  Medication Sig Dispense Refill  . acetaminophen (TYLENOL) 650 MG CR tablet Take 650 mg by mouth daily as needed for pain.       Marland Kitchen allopurinol (ZYLOPRIM) 300 MG tablet Take 300 mg by mouth every other day. Take in the evening - on even days of the month      . amLODipine (NORVASC) 5 MG tablet Take 5 mg by mouth daily.       Marland Kitchen aspirin EC 81 MG tablet Take 81 mg by mouth at bedtime.       . Cholecalciferol (VITAMIN D3) 2000 UNITS TABS Take 2,000 Units by mouth daily.       . clonazePAM (KLONOPIN) 1 MG tablet Take 0.5 mg by mouth at bedtime.       Marland Kitchen diltiazem (CARDIZEM CD) 240 MG 24 hr capsule Take 240 mg by mouth at  bedtime.       . famotidine (PEPCID) 20 MG tablet Take 20 mg by mouth at bedtime.       . fluticasone (FLONASE) 50 MCG/ACT nasal spray Place 2 sprays into both nostrils daily as needed for allergies.       . Glucose Blood (FREESTYLE TEST VI)       . ibuprofen (ADVIL,MOTRIN) 200 MG tablet Take 400 mg by mouth 2 (two) times daily as needed (pain).      Marland Kitchen levothyroxine (SYNTHROID, LEVOTHROID) 137 MCG tablet Take 137 mcg by mouth daily before breakfast.      . loratadine-pseudoephedrine (CLARITIN-D 24-HOUR) 10-240 MG per 24 hr tablet Take 1 tablet by mouth at bedtime.       . metoprolol succinate (TOPROL-XL) 50 MG 24 hr tablet Take 50 mg by mouth daily. Take with or immediately following a meal.      . Multiple Vitamin (MULTIVITAMIN WITH MINERALS) TABS Take 1 tablet by mouth at bedtime.       . Multiple Vitamins-Minerals (PRESERVISION AREDS 2) CAPS Take 1 capsule by mouth 2 (two) times daily.      Marland Kitchen olmesartan-hydrochlorothiazide (BENICAR HCT) 40-25 MG per tablet Take 1 tablet by mouth daily.       . Omega-3 Fatty Acids (FISH OIL) 1200 MG CAPS Take 1,200 mg by mouth 2 (two) times daily.      . Tamsulosin HCl (FLOMAX) 0.4 MG CAPS Take 0.4 mg by mouth every other day. Take in the evening - on even days of the month      . triamcinolone cream (KENALOG) 0.1 % Apply 1 application topically 4 (four) times daily as needed (poison ivy rash).       . vitamin B-12 (CYANOCOBALAMIN) 1000 MCG tablet Take 1,000 mcg by mouth at bedtime.       . ondansetron (ZOFRAN) 4 MG tablet Take 1 tablet (4 mg total) by mouth every 6 (six) hours.  12 tablet  0   No current facility-administered medications on file prior to visit.     REVIEW OF SYSTEMS: Cardiovascular: No chest pain, chest pressure, palpitations, orthopnea, or dyspnea on exertion. No claudication or rest pain,  No history of DVT or phlebitis. Pulmonary: No productive cough, asthma or wheezing. Neurologic: No  weakness, paresthesias, aphasia, or amaurosis. No  dizziness. Hematologic: No bleeding problems or clotting disorders. Musculoskeletal: No joint pain or joint swelling. Gastrointestinal: No blood in stool or hematemesis Genitourinary: No dysuria or hematuria. Psychiatric:: Recent admission for anxiety Integumentary: No rashes or ulcers. Constitutional: No fever or chills.  PHYSICAL EXAMINATION:   Vital signs are BP 148/86  Pulse 58  Ht 5\' 10"  (1.778 m)  Wt 206 lb 12.8 oz (93.804 kg)  BMI 29.67 kg/m2  SpO2 96% General: The patient appears their stated age. HEENT:  No gross abnormalities Pulmonary:  Non labored breathing Abdomen: Soft and non-tender Musculoskeletal: There are no major deformities. Neurologic: No focal weakness or paresthesias are detected, Skin: There are no ulcer or rashes noted. Psychiatric: The patient has normal affect. Cardiovascular: There is a regular rate and rhythm without significant murmur appreciated.  No carotid bruits   Diagnostic Studies I have reviewed his CT scan.  His aneurysm remains essentially unchanged if not slightly smaller in size.  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  I am going to attempt ultrasound evaluation in 6 months.  He has a history of having had ultrasound which did not correlate with the CT scan, likely from tortuosity of his aneurysm.  Eldridge Abrahams, M.D. [facility] [place] Office: [phone] Pager:  [phone]

## 2014-05-27 ENCOUNTER — Other Ambulatory Visit: Payer: Self-pay

## 2014-05-27 DIAGNOSIS — Z95828 Presence of other vascular implants and grafts: Secondary | ICD-10-CM

## 2014-05-27 DIAGNOSIS — I714 Abdominal aortic aneurysm, without rupture, unspecified: Secondary | ICD-10-CM

## 2014-05-27 DIAGNOSIS — Z48812 Encounter for surgical aftercare following surgery on the circulatory system: Secondary | ICD-10-CM

## 2014-10-22 ENCOUNTER — Encounter (HOSPITAL_COMMUNITY): Payer: Self-pay | Admitting: Surgery

## 2014-11-10 ENCOUNTER — Encounter: Payer: Self-pay | Admitting: Surgery

## 2014-11-13 HISTORY — PX: CATARACT EXTRACTION: SUR2

## 2014-11-16 ENCOUNTER — Encounter: Payer: Self-pay | Admitting: Family

## 2014-11-16 ENCOUNTER — Ambulatory Visit (HOSPITAL_COMMUNITY)
Admission: RE | Admit: 2014-11-16 | Discharge: 2014-11-16 | Disposition: A | Discharge status: Home / Self Care | Payer: Medicare Other | Source: Ambulatory Visit | Attending: Family | Admitting: Family

## 2014-11-16 ENCOUNTER — Ambulatory Visit (INDEPENDENT_AMBULATORY_CARE_PROVIDER_SITE_OTHER): Payer: Medicare Other | Admitting: Family

## 2014-11-16 VITALS — BP 136/82 | HR 50 | Resp 14 | Ht 70.0 in | Wt 207.0 lb

## 2014-11-16 DIAGNOSIS — I714 Abdominal aortic aneurysm, without rupture, unspecified: Secondary | ICD-10-CM

## 2014-11-16 DIAGNOSIS — Z95828 Presence of other vascular implants and grafts: Secondary | ICD-10-CM | POA: Diagnosis not present

## 2014-11-16 DIAGNOSIS — Z48812 Encounter for surgical aftercare following surgery on the circulatory system: Secondary | ICD-10-CM | POA: Insufficient documentation

## 2014-11-16 NOTE — Patient Instructions (Signed)
Abdominal Aortic Aneurysm An aneurysm is a weakened or damaged part of an artery wall that bulges from the normal force of blood pumping through the body. An abdominal aortic aneurysm is an aneurysm that occurs in the lower part of the aorta, the main artery of the body.  The major concern with an abdominal aortic aneurysm is that it can enlarge and burst (rupture) or blood can flow between the layers of the wall of the aorta through a tear (aorticdissection). Both of these conditions can cause bleeding inside the body and can be life threatening unless diagnosed and treated promptly. CAUSES  The exact cause of an abdominal aortic aneurysm is unknown. Some contributing factors are:   A hardening of the arteries caused by the buildup of fat and other substances in the lining of a blood vessel (arteriosclerosis).  Inflammation of the walls of an artery (arteritis).   Connective tissue diseases, such as Marfan syndrome.   Abdominal trauma.   An infection, such as syphilis or staphylococcus, in the wall of the aorta (infectious aortitis) caused by bacteria. RISK FACTORS  Risk factors that contribute to an abdominal aortic aneurysm may include:  Age older than 60 years.   High blood pressure (hypertension).  Male gender.  Ethnicity (white race).  Obesity.  Family history of aneurysm (first degree relatives only).  Tobacco use. PREVENTION  The following healthy lifestyle habits may help decrease your risk of abdominal aortic aneurysm:  Quitting smoking. Smoking can raise your blood pressure and cause arteriosclerosis.  Limiting or avoiding alcohol.  Keeping your blood pressure, blood sugar level, and cholesterol levels within normal limits.  Decreasing your salt intake. In somepeople, too much salt can raise blood pressure and increase your risk of abdominal aortic aneurysm.  Eating a diet low in saturated fats and cholesterol.  Increasing your fiber intake by including  whole grains, vegetables, and fruits in your diet. Eating these foods may help lower blood pressure.  Maintaining a healthy weight.  Staying physically active and exercising regularly. SYMPTOMS  The symptoms of abdominal aortic aneurysm may vary depending on the size and rate of growth of the aneurysm.Most grow slowly and do not have any symptoms. When symptoms do occur, they may include:  Pain (abdomen, side, lower back, or groin). The pain may vary in intensity. A sudden onset of severe pain may indicate that the aneurysm has ruptured.  Feeling full after eating only small amounts of food.  Nausea or vomiting or both.  Feeling a pulsating lump in the abdomen.  Feeling faint or passing out. DIAGNOSIS  Since most unruptured abdominal aortic aneurysms have no symptoms, they are often discovered during diagnostic exams for other conditions. An aneurysm may be found during the following procedures:  Ultrasonography (A one-time screening for abdominal aortic aneurysm by ultrasonography is also recommended for all men aged 65-75 years who have ever smoked).  X-ray exams.  A computed tomography (CT).  Magnetic resonance imaging (MRI).  Angiography or arteriography. TREATMENT  Treatment of an abdominal aortic aneurysm depends on the size of your aneurysm, your age, and risk factors for rupture. Medication to control blood pressure and pain may be used to manage aneurysms smaller than 6 cm. Regular monitoring for enlargement may be recommended by your caregiver if:  The aneurysm is 3-4 cm in size (an annual ultrasonography may be recommended).  The aneurysm is 4-4.5 cm in size (an ultrasonography every 6 months may be recommended).  The aneurysm is larger than 4.5 cm in   size (your caregiver may ask that you be examined by a vascular surgeon). If your aneurysm is larger than 6 cm, surgical repair may be recommended. There are two main methods for repair of an aneurysm:   Endovascular  repair (a minimally invasive surgery). This is done most often.  Open repair. This method is used if an endovascular repair is not possible. Document Released: 08/09/2005 Document Revised: 02/24/2013 Document Reviewed: 11/29/2012 Exit[employer] Patient Information 2015 [employer]. This information is not intended to replace advice given to you by your health care provider. Make sure you discuss any questions you have with your health care provider.  

## 2014-11-16 NOTE — Progress Notes (Signed)
[facility]  Established [facility]  History of Present Illness  Garrett OHTA Sr. is a 74 y.o. (1941/10/12) male patient of Dr. Trula Slade who is back today for followup of his abdominal aortic aneurysm. He initially underwent coil embolization of his right hypogastric artery. He developed severe buttock claudication therefore his aneurysm repair was delayed. His symptoms of claudication gradually improved and on 08/08/2012 he underwent endovascular repair of his aneurysm. 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   He has a history of having had ultrasounds of the AAA which did not correlate with the CT scan, likely from tortuosity of his aneurysm. He has no back or abdominal pain that is referable to a AAA. He denies any history of TIA or stroke. He reports myalgias with use of statins.   He has right buttocks pain at times with walking of which Dr. Trula Slade is aware per pt.  He was hospitalized in 2015 for what turned out to be a bout of anxiety. During that hospitalization his CT scan was performed which Dr. Trula Slade reviewed.   Pt Diabetic: Yes, diet controlled Pt smoker: former smoker, quit in 1971 He had transient atrial fib during hospitalization for CABG in 2010 He takes a daily ASA and no other antiplatelet agents nor anticoagulants   Past Medical History  Diagnosis Date  . AAA (abdominal aortic aneurysm)   . CAD (coronary artery disease)   . Hypertension   . Hyperlipidemia   . Thyroid disease     hypothyroidism  . Reflux   . Gout   . Nerve compression     right leg  . Hypothyroidism   . Seasonal allergies   . GERD (gastroesophageal reflux disease)   . Myocardial infarction 01/1991    sp inferior  . Atrial fibrillation   . Kidney stone     right kidney  . Cancer     prostate  . Arthritis     knees  . 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 Parathyroid adenoma    . Spine surgery    . Kidney stone surgery    . Tonsillectomy    . Prostate surgery      rad seeds  . Coronary stent placement  1992 and  2009    RCA  . Abdominal aortagram embolization  02/28/2012  . Coronary artery bypass graft  04/2009  . Abdominal aortic aneurysm repair  Sept. 2013  . Abdominal aortagram N/A 02/28/2012    Procedure: ABDOMINAL Maxcine Ham;  Surgeon: Serafina Mitchell, MD;  Location: [facility] CATH LAB;  Service: Cardiovascular;  Laterality: N/A;  . Embolization Right 02/28/2012    Procedure: EMBOLIZATION;  Surgeon: Serafina Mitchell, MD;  Location: [facility] CATH LAB;  Service: Cardiovascular;  Laterality: Right;   Social History History  Substance Use Topics  . Smoking status: Former Smoker    Types: Cigarettes    Quit date: 08/01/1970  . Smokeless tobacco: Never Used  . Alcohol Use: 3.6 - 5.4 oz/week    4-5 Glasses of wine, 2-4 Cans of beer per week     Comment: 1-3 glasses 3-4 times a week   Family History Family History  Problem Relation Age of Onset  . Heart disease Father     Heart Disease before age 21  . Heart attack Father   . Hyperlipidemia Father   .  Hypertension Father   . Stroke Mother   . Deep vein thrombosis Mother   . Heart disease Sister   . Diabetes Sister   . Hyperlipidemia Sister   . Hypertension Sister   . Heart attack Sister   . Heart disease Paternal Uncle   . Diabetes Maternal Grandmother   . Diabetes Sister    Current Outpatient Prescriptions on File Prior to Visit  Medication Sig Dispense Refill  . acetaminophen (TYLENOL) 650 MG CR tablet Take 650 mg by mouth daily as needed for pain.     Marland Kitchen allopurinol (ZYLOPRIM) 300 MG tablet Take 300 mg by mouth every other day. Take in the evening - on even days of the month    . amLODipine (NORVASC) 5 MG tablet Take 5 mg by mouth daily.     Marland Kitchen aspirin EC 81 MG tablet Take 81 mg by mouth at bedtime.     . Cholecalciferol (VITAMIN D3) 2000 UNITS  TABS Take 2,000 Units by mouth daily.     . clonazePAM (KLONOPIN) 1 MG tablet Take 0.5 mg by mouth at bedtime.     Marland Kitchen diltiazem (CARDIZEM CD) 240 MG 24 hr capsule Take 240 mg by mouth at bedtime.     . famotidine (PEPCID) 20 MG tablet Take 20 mg by mouth at bedtime.     . fluticasone (FLONASE) 50 MCG/ACT nasal spray Place 2 sprays into both nostrils daily as needed for allergies.     . Glucose Blood (FREESTYLE TEST VI)     . ibuprofen (ADVIL,MOTRIN) 200 MG tablet Take 400 mg by mouth 2 (two) times daily as needed (pain).    Marland Kitchen levothyroxine (SYNTHROID, LEVOTHROID) 137 MCG tablet Take 137 mcg by mouth daily before breakfast.    . loratadine-pseudoephedrine (CLARITIN-D 24-HOUR) 10-240 MG per 24 hr tablet Take 1 tablet by mouth at bedtime.     . metoprolol succinate (TOPROL-XL) 50 MG 24 hr tablet Take 50 mg by mouth daily. Take with or immediately following a meal.    . Multiple Vitamin (MULTIVITAMIN WITH MINERALS) TABS Take 1 tablet by mouth at bedtime.     . Multiple Vitamins-Minerals (PRESERVISION AREDS 2) CAPS Take 1 capsule by mouth 2 (two) times daily.    Marland Kitchen olmesartan-hydrochlorothiazide (BENICAR HCT) 40-25 MG per tablet Take 1 tablet by mouth daily.     . Omega-3 Fatty Acids (FISH OIL) 1200 MG CAPS Take 1,200 mg by mouth 2 (two) times daily.    . ondansetron (ZOFRAN) 4 MG tablet Take 1 tablet (4 mg total) by mouth every 6 (six) hours. 12 tablet 0  . Tamsulosin HCl (FLOMAX) 0.4 MG CAPS Take 0.4 mg by mouth every other day. Take in the evening - on even days of the month    . triamcinolone cream (KENALOG) 0.1 % Apply 1 application topically 4 (four) times daily as needed (poison ivy rash).     . vitamin B-12 (CYANOCOBALAMIN) 1000 MCG tablet Take 1,000 mcg by mouth at bedtime.      No current facility-administered medications on file prior to visit.   Allergies  Allergen Reactions  . Ace Inhibitors Cough  . Crestor [Rosuvastatin] Other (See Comments)    Muscle aches  . Nitroglycerin Other  (See Comments)    Very pronounced lowered BP with IV nitro for caths     ROS: See HPI for pertinent positives and negatives.  Physical Examination  Filed Vitals:   11/16/14 1012  BP: 136/82  Pulse: 50  Resp: 14  Height:  5\' 10"  (1.778 m)  Weight: 207 lb (93.895 kg)  SpO2: 97%   Body mass index is 29.7 kg/(m^2).  General: A&O x 3, WD.  Pulmonary: Sym exp, good air movt, CTAB, no rales, rhonchi, or wheezing.  Cardiac: RRR, Nl S1, S2, no murmur appreciated  Vascular: Vessel Right Left  Radial 2+Palpable 2+Palpable  Carotid  without bruit  without bruit  Aorta Not palpable N/A  Femoral 2+Palpable 2+Palpable  Popliteal Not palpable Not palpable  PT 2+Palpable 2+Palpable  DP 2+Palpable 2+Palpable   Gastrointestinal: soft, NTND, -G/R, - HSM, - palpable masses, - CVAT B.  Musculoskeletal: M/S 5/5 throughout, Extremities without ischemic changes.  Neurologic: Pain and light touch intact in extremities, Motor exam as listed above  Non-Invasive Vascular Imaging  EVAR Duplex (Date: 11/16/2014)  AAA sac size: 5.2 cm x 5.0 cm  Evidence of a type 2 endo leak  Limited visualization of the common iliac arteries  Maximum diameter of today's exam has decreased since the last Duplex, however, this may be due to limited visualization; technically difficult exam due to overlying bowel gas.    Medical Decision Making  Garrett ORAHOOD Sr. is a 74 y.o. male who presents s/p EVAR (Date: 08/08/12).  Pt is asymptomatic with decreasing sac size, continued type 2 endo leak.  I discussed with the patient the importance of surveillance of the endograft.  Based on today's EVAR Duplex and exam, and after discussing with Dr. Trula Slade, the next CTA will be scheduled for 6 months.  The patient will follow up with Dr. Trula Slade in 6 months with these studies.  I emphasized the importance of maximal medical management including strict control of blood pressure, blood glucose, and lipid levels,  antiplatelet agents, obtaining regular exercise, and cessation of smoking.   The patient was given information about AAA including signs, symptoms, treatment, and how to minimize the risk of enlargement and rupture of aneurysms.    Thank you for allowing [name] to participate in this patient's care.  Clemon Chambers, RN, MSN, FNP-C [facility] [place] Office: [phone]  Clinic Physician: Trula Slade  11/16/2014, 10:27 AM

## 2015-01-07 ENCOUNTER — Observation Stay (HOSPITAL_COMMUNITY)
Admission: EM | Admit: 2015-01-07 | Discharge: 2015-01-08 | Disposition: A | Discharge status: Home / Self Care | Payer: Medicare Other | Source: Ambulatory Visit | Attending: Cardiology | Admitting: Cardiology

## 2015-01-07 ENCOUNTER — Other Ambulatory Visit: Payer: Self-pay

## 2015-01-07 ENCOUNTER — Encounter (HOSPITAL_COMMUNITY): Payer: Self-pay

## 2015-01-07 ENCOUNTER — Emergency Department (HOSPITAL_COMMUNITY): Payer: Medicare Other

## 2015-01-07 DIAGNOSIS — I10 Essential (primary) hypertension: Secondary | ICD-10-CM | POA: Diagnosis not present

## 2015-01-07 DIAGNOSIS — R079 Chest pain, unspecified: Secondary | ICD-10-CM | POA: Diagnosis present

## 2015-01-07 DIAGNOSIS — E039 Hypothyroidism, unspecified: Secondary | ICD-10-CM | POA: Insufficient documentation

## 2015-01-07 DIAGNOSIS — K8020 Calculus of gallbladder without cholecystitis without obstruction: Secondary | ICD-10-CM | POA: Diagnosis not present

## 2015-01-07 DIAGNOSIS — I25710 Atherosclerosis of autologous vein coronary artery bypass graft(s) with unstable angina pectoris: Principal | ICD-10-CM | POA: Insufficient documentation

## 2015-01-07 DIAGNOSIS — E119 Type 2 diabetes mellitus without complications: Secondary | ICD-10-CM | POA: Diagnosis not present

## 2015-01-07 DIAGNOSIS — Z951 Presence of aortocoronary bypass graft: Secondary | ICD-10-CM | POA: Diagnosis not present

## 2015-01-07 DIAGNOSIS — K219 Gastro-esophageal reflux disease without esophagitis: Secondary | ICD-10-CM | POA: Diagnosis not present

## 2015-01-07 DIAGNOSIS — I252 Old myocardial infarction: Secondary | ICD-10-CM | POA: Diagnosis not present

## 2015-01-07 DIAGNOSIS — R0789 Other chest pain: Secondary | ICD-10-CM | POA: Diagnosis present

## 2015-01-07 DIAGNOSIS — F419 Anxiety disorder, unspecified: Secondary | ICD-10-CM | POA: Diagnosis not present

## 2015-01-07 DIAGNOSIS — Z8546 Personal history of malignant neoplasm of prostate: Secondary | ICD-10-CM | POA: Insufficient documentation

## 2015-01-07 DIAGNOSIS — E876 Hypokalemia: Secondary | ICD-10-CM | POA: Diagnosis not present

## 2015-01-07 DIAGNOSIS — Z87891 Personal history of nicotine dependence: Secondary | ICD-10-CM | POA: Diagnosis not present

## 2015-01-07 DIAGNOSIS — E785 Hyperlipidemia, unspecified: Secondary | ICD-10-CM | POA: Insufficient documentation

## 2015-01-07 LAB — CBC
HCT: 43.4 % (ref 39.0–52.0)
HEMOGLOBIN: 16.1 g/dL (ref 13.0–17.0)
MCH: 31.9 pg (ref 26.0–34.0)
MCHC: 37.1 g/dL — ABNORMAL HIGH (ref 30.0–36.0)
MCV: 86.1 fL (ref 78.0–100.0)
PLATELETS: 171 10*3/uL (ref 150–400)
RBC: 5.04 MIL/uL (ref 4.22–5.81)
RDW: 12.8 % (ref 11.5–15.5)
WBC: 5.4 10*3/uL (ref 4.0–10.5)

## 2015-01-07 LAB — BASIC METABOLIC PANEL
ANION GAP: 11 (ref 5–15)
BUN: 15 mg/dL (ref 6–23)
CO2: 25 mmol/L (ref 19–32)
Calcium: 10 mg/dL (ref 8.4–10.5)
Chloride: 101 mmol/L (ref 96–112)
Creatinine, Ser: 1.17 mg/dL (ref 0.50–1.35)
GFR calc Af Amer: 70 mL/min — ABNORMAL LOW (ref >90)
GFR calc non Af Amer: 60 mL/min — ABNORMAL LOW (ref >90)
GLUCOSE: 130 mg/dL — AB (ref 70–99)
Potassium: 3.4 mmol/L — ABNORMAL LOW (ref 3.5–5.1)
SODIUM: 137 mmol/L (ref 135–145)

## 2015-01-07 LAB — I-STAT TROPONIN, ED: Troponin i, poc: 0 ng/mL (ref 0.00–0.08)

## 2015-01-07 MED ORDER — ALLOPURINOL 300 MG PO TABS
300.0000 mg | ORAL_TABLET | ORAL | Status: DC
  Filled 2015-01-07: qty 1

## 2015-01-07 MED ORDER — DILTIAZEM HCL ER COATED BEADS 240 MG PO CP24
240.0000 mg | ORAL_CAPSULE | Freq: Every day | ORAL | Status: DC
  Administered 2015-01-07: 240 mg via ORAL
  Filled 2015-01-07 (×2): qty 1

## 2015-01-07 MED ORDER — AMLODIPINE BESYLATE 5 MG PO TABS
5.0000 mg | ORAL_TABLET | Freq: Every day | ORAL | Status: DC
  Administered 2015-01-08: 5 mg via ORAL
  Filled 2015-01-07: qty 1

## 2015-01-07 MED ORDER — POTASSIUM CHLORIDE CRYS ER 20 MEQ PO TBCR: 20.0000 meq | EXTENDED_RELEASE_TABLET | Freq: Once | ORAL | Status: DC

## 2015-01-07 MED ORDER — SODIUM CHLORIDE 0.9 % IJ SOLN
3.0000 mL | Freq: Two times a day (BID) | INTRAMUSCULAR | Status: DC
  Administered 2015-01-08: 3 mL via INTRAVENOUS

## 2015-01-07 MED ORDER — IRBESARTAN 300 MG PO TABS
300.0000 mg | ORAL_TABLET | Freq: Every day | ORAL | Status: DC
  Administered 2015-01-08: 300 mg via ORAL
  Filled 2015-01-07: qty 1

## 2015-01-07 MED ORDER — ASPIRIN 300 MG RE SUPP
300.0000 mg | RECTAL | Status: DC
  Filled 2015-01-07: qty 1

## 2015-01-07 MED ORDER — ACETAMINOPHEN 325 MG PO TABS: 650.0000 mg | ORAL_TABLET | ORAL | Status: DC | PRN

## 2015-01-07 MED ORDER — POTASSIUM CHLORIDE CRYS ER 20 MEQ PO TBCR
20.0000 meq | EXTENDED_RELEASE_TABLET | Freq: Every day | ORAL | Status: DC
  Filled 2015-01-07: qty 1

## 2015-01-07 MED ORDER — HEPARIN SODIUM (PORCINE) 5000 UNIT/ML IJ SOLN
5000.0000 [IU] | Freq: Three times a day (TID) | INTRAMUSCULAR | Status: DC
  Administered 2015-01-07 – 2015-01-08 (×2): 5000 [IU] via SUBCUTANEOUS
  Filled 2015-01-07 (×5): qty 1

## 2015-01-07 MED ORDER — HYDROCHLOROTHIAZIDE 25 MG PO TABS
25.0000 mg | ORAL_TABLET | Freq: Every day | ORAL | Status: DC
  Administered 2015-01-08: 25 mg via ORAL
  Filled 2015-01-07: qty 1

## 2015-01-07 MED ORDER — ASPIRIN 81 MG PO CHEW
324.0000 mg | CHEWABLE_TABLET | Freq: Once | ORAL | Status: AC
  Administered 2015-01-07: 324 mg via ORAL
  Filled 2015-01-07: qty 4

## 2015-01-07 MED ORDER — FAMOTIDINE 20 MG PO TABS
20.0000 mg | ORAL_TABLET | Freq: Every day | ORAL | Status: DC
  Administered 2015-01-07: 20 mg via ORAL
  Filled 2015-01-07 (×2): qty 1

## 2015-01-07 MED ORDER — CLONAZEPAM 0.5 MG PO TABS
0.5000 mg | ORAL_TABLET | Freq: Every day | ORAL | Status: DC
  Administered 2015-01-07: 0.5 mg via ORAL
  Filled 2015-01-07: qty 1

## 2015-01-07 MED ORDER — SODIUM CHLORIDE 0.9 % IV SOLN: 250.0000 mL | INTRAVENOUS | Status: DC | PRN

## 2015-01-07 MED ORDER — METOPROLOL SUCCINATE ER 50 MG PO TB24
50.0000 mg | ORAL_TABLET | Freq: Every day | ORAL | Status: DC
  Administered 2015-01-08: 50 mg via ORAL
  Filled 2015-01-07: qty 1

## 2015-01-07 MED ORDER — GI COCKTAIL ~~LOC~~
30.0000 mL | Freq: Once | ORAL | Status: AC
  Administered 2015-01-07: 30 mL via ORAL
  Filled 2015-01-07: qty 30

## 2015-01-07 MED ORDER — LEVOTHYROXINE SODIUM 137 MCG PO TABS
137.0000 ug | ORAL_TABLET | Freq: Every day | ORAL | Status: DC
  Administered 2015-01-08: 137 ug via ORAL
  Filled 2015-01-07 (×2): qty 1

## 2015-01-07 MED ORDER — ASPIRIN EC 81 MG PO TBEC
81.0000 mg | DELAYED_RELEASE_TABLET | Freq: Every day | ORAL | Status: DC
  Administered 2015-01-08: 81 mg via ORAL
  Filled 2015-01-07: qty 1

## 2015-01-07 MED ORDER — SODIUM CHLORIDE 0.9 % IJ SOLN: 3.0000 mL | INTRAMUSCULAR | Status: DC | PRN

## 2015-01-07 MED ORDER — ASPIRIN 81 MG PO CHEW: 324.0000 mg | CHEWABLE_TABLET | ORAL | Status: DC

## 2015-01-07 MED ORDER — ONDANSETRON HCL 4 MG/2ML IJ SOLN: 4.0000 mg | Freq: Four times a day (QID) | INTRAMUSCULAR | Status: DC | PRN

## 2015-01-07 MED ORDER — ASPIRIN EC 81 MG PO TBEC
81.0000 mg | DELAYED_RELEASE_TABLET | Freq: Every day | ORAL | Status: DC
  Filled 2015-01-07 (×2): qty 1

## 2015-01-07 MED ORDER — OLMESARTAN MEDOXOMIL-HCTZ 40-25 MG PO TABS: 1.0000 | ORAL_TABLET | Freq: Every day | ORAL | Status: DC

## 2015-01-07 NOTE — H&P (Signed)
Admit date: 01/07/2015 Referring Physician Dr. Mingo Amber Primary Cardiologist: Dr. 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  He says that he developed a burning sensation in his chest tonight around 6pm.  It started in his stomach and radiated up into his chest and throat. This is different from his anginal pain he has had in the past with his CAD.  He did not take anything for the discomfort and came to the ER.  He had SOB associated with the burning.  He has noticed over the past few days that he has been getting DOE with minimal distance.  He denies any LE edema.  He denies any diaphoresis although he was mildly nauseated this evening.  Currently he complains of some burning in the upper abdomen.  He denies any belching, fever, chills or vomiting.    PMH:    Past Medical History  Diagnosis Date  . AAA (abdominal aortic aneurysm)   . CAD (coronary artery disease)   . Hypertension   . Hyperlipidemia   . Thyroid disease     hypothyroidism  . Reflux   . Gout   . Nerve compression     right leg  . Hypothyroidism   . Seasonal allergies   . GERD (gastroesophageal reflux disease)   . Myocardial infarction 01/1991    sp inferior  . Atrial fibrillation   . Kidney stone     right kidney  . Cancer     prostate  . Arthritis     knees  . 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 Parathyroid adenoma    . Spine surgery    . Kidney stone surgery    . Tonsillectomy    . Prostate surgery      rad seeds  . Coronary stent placement  1992 and  2009    RCA  . Abdominal aortagram embolization  02/28/2012  . Coronary artery bypass graft  04/2009  . Abdominal aortic aneurysm  repair  Sept. 2013  . Abdominal aortagram N/A 02/28/2012    Procedure: ABDOMINAL Maxcine Ham;  Surgeon: Serafina Mitchell, MD;  Location: [facility] CATH LAB;  Service: Cardiovascular;  Laterality: N/A;  . Embolization Right 02/28/2012    Procedure: EMBOLIZATION;  Surgeon: Serafina Mitchell, MD;  Location: [facility] CATH LAB;  Service: Cardiovascular;  Laterality: Right;    ALLERGIES:   Ace inhibitors; Crestor; and Nitroglycerin  Prior to Admit Meds:   (Not in a hospital admission) Family HX:    Family History  Problem Relation Age of Onset  . Heart disease Father     Heart Disease before age 66  . Heart attack Father   . Hyperlipidemia Father   . Hypertension Father   . Stroke Mother   . Deep vein thrombosis Mother   . Heart disease Sister   . Diabetes Sister   . Hyperlipidemia Sister   . Hypertension Sister   . Heart attack Sister   . Heart disease Paternal Uncle   . Diabetes Maternal Grandmother   . Diabetes Sister    Social HX:    History   Social History  . Marital Status: Divorced  Spouse Name: N/A  . Number of Children: N/A  . Years of Education: N/A   Occupational History  . Not on file.   Social History Main Topics  . Smoking status: Former Smoker    Types: Cigarettes    Quit date: 08/01/1970  . Smokeless tobacco: Never Used  . Alcohol Use: 3.6 - 5.4 oz/week    4-5 Glasses of wine, 2-4 Cans of beer per week     Comment: 1-3 glasses 3-4 times a week  . Drug Use: No  . Sexual Activity: Not on file   Other Topics Concern  . Not on file   Social History Narrative     ROS:  All 11 ROS were addressed and are negative except what is stated in the HPI  PHYSICAL EXAM Filed Vitals:   01/07/15 2115  BP: 151/94  Pulse: 77  Temp:   Resp: 11   General: Well developed, well nourished, in no acute distress Head: Eyes PERRLA, No xanthomas.   Normal cephalic and atramatic  Lungs:   Clear bilaterally to auscultation and percussion. Heart:   HRRR S1 S2 Pulses are 2+ & equal.             No carotid bruit. No JVD.  No abdominal bruits. No femoral bruits. Abdomen: Bowel sounds are positive, abdomen soft and non-tender without masses  Extremities:   No clubbing, cyanosis or edema.  DP +1 Neuro: Alert and oriented X 3. Psych:  Good affect, responds appropriately   Labs:   Lab Results  Component Value Date   WBC 5.4 01/07/2015   HGB 16.1 01/07/2015   HCT 43.4 01/07/2015   MCV 86.1 01/07/2015   PLT 171 01/07/2015    Recent Labs Lab 01/07/15 1847  NA 137  K 3.4*  CL 101  CO2 25  BUN 15  CREATININE 1.17  CALCIUM 10.0  GLUCOSE 130*   Lab Results  Component Value Date   CKTOTAL 101 04/27/2009   CKMB 1.2 04/27/2009   TROPONINI 0.05        NO INDICATION OF MYOCARDIAL INJURY. 04/27/2009   No results found for: PTT Lab Results  Component Value Date   INR 1.18 08/08/2012   INR 1.13 07/31/2012   INR 1.14 04/13/2011     Lab Results  Component Value Date   CHOL  04/27/2009    132        ATP III CLASSIFICATION:  <200     mg/dL   Desirable  200-239  mg/dL   Borderline High  >=240    mg/dL   High          Lab Results  Component Value Date   HDL 30* 04/27/2009   Lab Results  Component Value Date   LDLCALC  04/27/2009    63        Total Cholesterol/HDL:CHD Risk Coronary Heart Disease Risk Table                     Men   Women  1/2 Average Risk   3.4   3.3  Average Risk       5.0   4.4  2 X Average Risk   9.6   7.1  3 X Average Risk  23.4   11.0        Use the calculated Patient Ratio above and the CHD Risk Table to determine the patient's CHD Risk.        ATP III CLASSIFICATION (LDL):  <100  mg/dL   Optimal  100-129  mg/dL   Near or Above                    Optimal  130-159  mg/dL   Borderline  160-189  mg/dL   High  >190     mg/dL   Very High   Lab Results  Component Value Date   TRIG 193* 04/27/2009   Lab Results  Component Value Date   CHOLHDL 4.4 04/27/2009   No results found for: LDLDIRECT    Radiology:  No results  found.  EKG:  NSR with bigeminal PVC's  ASSESSMENT:  1.  Atypical chest discomfort that is described as a burning that starts in his upper abdomen and radiates into his neck and throat.  This is somewhat atypical and not like his prior angina but he has noticed increased DOE when walking even short distances.  EKG shows frequent ventricular ectopy but no acute ST changes.  He has a chronic RBBB.  He has some mild residual burning in his abdomen.   2.  ASCAD with remote MI and CABG in 2010 3.  HTN - slightly elevated today 4.  Dyslipidemia 5.  AAA - asymptomatic s/p sent graft 2013 6.  Prostate CA s/p seed implant 7.  Cholelithiasis 8.  Hypokalemia 9.  Ventricular ectopy - may be due to hypokalemia  PLAN:   1.  Admit to 23hour observation 2.  Cycle cardiac enzymes 3.  Continue current meds 4.  GI cocktail given burning nature of discomfort 5.  NPO after MN 6.  Consider nuclear stress test in am if enzymes remain negative 7.  Replete potassium 8.  2D echo in am to assess LVF due to CP and increased ventricular ectopy  Sueanne Margarita, MD  01/07/2015  9:33 PM
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
## 2015-01-07 NOTE — ED Notes (Signed)
May have forgotten to take the A.M. Blood pressure medications due to the current episode.

## 2015-01-07 NOTE — ED Notes (Signed)
Angiogram/Angioplasty x7, 5 way cardiac bypass in past, Myocardial infarct, AAA stent since bypass. HTN, high triglycerides (treated with statin and Crestor but not currently being treated).   Today has had SOB with exertion, over greater than a week. Has cut back on walking recently. Today pain in neck.

## 2015-01-07 NOTE — ED Notes (Signed)
Dr. Radford Pax, Cardiologist, at bedside.

## 2015-01-07 NOTE — ED Notes (Signed)
Dr. Walden at bedside 

## 2015-01-07 NOTE — ED Notes (Signed)
Pt reporting chest pain that started today.  Pain substernal with radiation to neck.  Sts these were the previous symptoms he had with his prior MI in 36.

## 2015-01-07 NOTE — ED Notes (Signed)
MF at bedside and plan of care discussed with patient and family. Patient verbalized understanding to MD and asked pertinent questions.

## 2015-01-08 ENCOUNTER — Encounter (HOSPITAL_COMMUNITY)
Admission: EM | Disposition: A | Discharge status: Home / Self Care | Payer: Self-pay | Source: Ambulatory Visit | Attending: Emergency Medicine

## 2015-01-08 DIAGNOSIS — R0600 Dyspnea, unspecified: Secondary | ICD-10-CM

## 2015-01-08 HISTORY — PX: LEFT HEART CATHETERIZATION WITH CORONARY/GRAFT ANGIOGRAM: SHX5450

## 2015-01-08 LAB — COMPREHENSIVE METABOLIC PANEL
ALBUMIN: 3.8 g/dL (ref 3.5–5.2)
ALT: 61 U/L — ABNORMAL HIGH (ref 0–53)
AST: 40 U/L — ABNORMAL HIGH (ref 0–37)
Alkaline Phosphatase: 80 U/L (ref 39–117)
Anion gap: 8 (ref 5–15)
BILIRUBIN TOTAL: 0.9 mg/dL (ref 0.3–1.2)
BUN: 13 mg/dL (ref 6–23)
CO2: 26 mmol/L (ref 19–32)
CREATININE: 1.07 mg/dL (ref 0.50–1.35)
Calcium: 9.4 mg/dL (ref 8.4–10.5)
Chloride: 104 mmol/L (ref 96–112)
GFR calc Af Amer: 78 mL/min — ABNORMAL LOW (ref >90)
GFR, EST NON AFRICAN AMERICAN: 67 mL/min — AB (ref >90)
GLUCOSE: 130 mg/dL — AB (ref 70–99)
Potassium: 3.2 mmol/L — ABNORMAL LOW (ref 3.5–5.1)
Sodium: 138 mmol/L (ref 135–145)
TOTAL PROTEIN: 6.3 g/dL (ref 6.0–8.3)

## 2015-01-08 LAB — CBC WITH DIFFERENTIAL/PLATELET
Basophils Absolute: 0 10*3/uL (ref 0.0–0.1)
Basophils Relative: 1 % (ref 0–1)
EOS PCT: 1 % (ref 0–5)
Eosinophils Absolute: 0 10*3/uL (ref 0.0–0.7)
HCT: 42 % (ref 39.0–52.0)
Hemoglobin: 15.2 g/dL (ref 13.0–17.0)
LYMPHS ABS: 1.2 10*3/uL (ref 0.7–4.0)
Lymphocytes Relative: 24 % (ref 12–46)
MCH: 31.1 pg (ref 26.0–34.0)
MCHC: 36.2 g/dL — ABNORMAL HIGH (ref 30.0–36.0)
MCV: 86.1 fL (ref 78.0–100.0)
MONO ABS: 0.5 10*3/uL (ref 0.1–1.0)
Monocytes Relative: 10 % (ref 3–12)
NEUTROS ABS: 3.3 10*3/uL (ref 1.7–7.7)
Neutrophils Relative %: 64 % (ref 43–77)
Platelets: 165 10*3/uL (ref 150–400)
RBC: 4.88 MIL/uL (ref 4.22–5.81)
RDW: 12.8 % (ref 11.5–15.5)
WBC: 5.1 10*3/uL (ref 4.0–10.5)

## 2015-01-08 LAB — BASIC METABOLIC PANEL
Anion gap: 7 (ref 5–15)
BUN: 11 mg/dL (ref 6–23)
CHLORIDE: 105 mmol/L (ref 96–112)
CO2: 27 mmol/L (ref 19–32)
CREATININE: 1.1 mg/dL (ref 0.50–1.35)
Calcium: 9.3 mg/dL (ref 8.4–10.5)
GFR, EST AFRICAN AMERICAN: 75 mL/min — AB (ref >90)
GFR, EST NON AFRICAN AMERICAN: 65 mL/min — AB (ref >90)
Glucose, Bld: 124 mg/dL — ABNORMAL HIGH (ref 70–99)
POTASSIUM: 3.1 mmol/L — AB (ref 3.5–5.1)
Sodium: 139 mmol/L (ref 135–145)

## 2015-01-08 LAB — TROPONIN I
Troponin I: 0.34 ng/mL — ABNORMAL HIGH (ref <0.031)
Troponin I: <0.03 ng/mL (ref <0.031)
Troponin I: <0.03 ng/mL (ref <0.031)

## 2015-01-08 LAB — APTT: aPTT: 29 seconds (ref 24–37)

## 2015-01-08 LAB — MAGNESIUM: Magnesium: 2.1 mg/dL (ref 1.5–2.5)

## 2015-01-08 LAB — PROTIME-INR
INR: 1.13 (ref 0.00–1.49)
INR: 1.19 (ref 0.00–1.49)
PROTHROMBIN TIME: 14.6 s (ref 11.6–15.2)
PROTHROMBIN TIME: 15.2 s (ref 11.6–15.2)

## 2015-01-08 LAB — CBC
HCT: 42.5 % (ref 39.0–52.0)
Hemoglobin: 15.1 g/dL (ref 13.0–17.0)
MCH: 30.8 pg (ref 26.0–34.0)
MCHC: 35.5 g/dL (ref 30.0–36.0)
MCV: 86.7 fL (ref 78.0–100.0)
PLATELETS: 169 10*3/uL (ref 150–400)
RBC: 4.9 MIL/uL (ref 4.22–5.81)
RDW: 12.8 % (ref 11.5–15.5)
WBC: 4.6 10*3/uL (ref 4.0–10.5)

## 2015-01-08 LAB — LIPID PANEL
CHOLESTEROL: 195 mg/dL (ref 0–200)
HDL: 29 mg/dL — AB (ref >39)
LDL Cholesterol: 102 mg/dL — ABNORMAL HIGH (ref 0–99)
TRIGLYCERIDES: 318 mg/dL — AB (ref <150)
Total CHOL/HDL Ratio: 6.7 RATIO
VLDL: 64 mg/dL — ABNORMAL HIGH (ref 0–40)

## 2015-01-08 LAB — TSH: TSH: 1.859 u[IU]/mL (ref 0.350–4.500)

## 2015-01-08 LAB — T4, FREE: Free T4: 2.2 ng/dL — ABNORMAL HIGH (ref 0.80–1.80)

## 2015-01-08 SURGERY — LEFT HEART CATHETERIZATION WITH CORONARY/GRAFT ANGIOGRAM
Anesthesia: LOCAL

## 2015-01-08 MED ORDER — ASPIRIN 81 MG PO CHEW: 81.0000 mg | CHEWABLE_TABLET | ORAL | Status: AC

## 2015-01-08 MED ORDER — HEPARIN (PORCINE) IN NACL 2-0.9 UNIT/ML-% IJ SOLN
INTRAMUSCULAR | Status: AC
  Filled 2015-01-08: qty 1000

## 2015-01-08 MED ORDER — SODIUM CHLORIDE 0.9 % IJ SOLN: 3.0000 mL | INTRAMUSCULAR | Status: DC | PRN

## 2015-01-08 MED ORDER — ACETAMINOPHEN 325 MG PO TABS: 650.0000 mg | ORAL_TABLET | ORAL | Status: DC | PRN

## 2015-01-08 MED ORDER — POTASSIUM CHLORIDE CRYS ER 20 MEQ PO TBCR: 20.0000 meq | EXTENDED_RELEASE_TABLET | Freq: Every day | ORAL | Status: DC

## 2015-01-08 MED ORDER — SODIUM CHLORIDE 0.9 % IJ SOLN
3.0000 mL | Freq: Two times a day (BID) | INTRAMUSCULAR | Status: DC
  Administered 2015-01-08: 3 mL via INTRAVENOUS

## 2015-01-08 MED ORDER — MIDAZOLAM HCL 2 MG/2ML IJ SOLN
INTRAMUSCULAR | Status: AC
  Filled 2015-01-08: qty 2

## 2015-01-08 MED ORDER — LIDOCAINE HCL (PF) 1 % IJ SOLN
INTRAMUSCULAR | Status: AC
  Filled 2015-01-08: qty 30

## 2015-01-08 MED ORDER — METOPROLOL SUCCINATE ER 100 MG PO TB24: 100.0000 mg | ORAL_TABLET | Freq: Every day | ORAL | Status: DC

## 2015-01-08 MED ORDER — ONDANSETRON HCL 4 MG/2ML IJ SOLN: 4.0000 mg | Freq: Four times a day (QID) | INTRAMUSCULAR | Status: DC | PRN

## 2015-01-08 MED ORDER — SODIUM CHLORIDE 0.9 % IV SOLN: 250.0000 mL | INTRAVENOUS | Status: DC | PRN

## 2015-01-08 MED ORDER — NITROGLYCERIN 1 MG/10 ML FOR IR/CATH LAB
INTRA_ARTERIAL | Status: AC
  Filled 2015-01-08: qty 10

## 2015-01-08 MED ORDER — SODIUM CHLORIDE 0.9 % IV SOLN: 1.0000 mL/kg/h | INTRAVENOUS | Status: DC

## 2015-01-08 MED ORDER — POTASSIUM CHLORIDE CRYS ER 20 MEQ PO TBCR
40.0000 meq | EXTENDED_RELEASE_TABLET | Freq: Two times a day (BID) | ORAL | Status: DC
  Administered 2015-01-08 (×2): 40 meq via ORAL
  Filled 2015-01-08 (×2): qty 2

## 2015-01-08 MED ORDER — SODIUM CHLORIDE 0.9 % IV SOLN
1.0000 mL/kg/h | INTRAVENOUS | Status: DC
  Administered 2015-01-08: 1 mL/kg/h via INTRAVENOUS

## 2015-01-08 MED ORDER — ESOMEPRAZOLE MAGNESIUM 20 MG PO PACK: 20.0000 mg | PACK | Freq: Every day | ORAL | Status: DC

## 2015-01-08 NOTE — Discharge Summary (Signed)
Physician Discharge Summary  Patient ID: Garrett DICLEMENTE Sr. MRN: [id number] DOB/AGE: December 25, 1940 74 y.o.  Admit date: 01/07/2015 Discharge date: 01/08/2015  Primary Physician:  Dr. Antony Contras  Primary Discharge Diagnosis:  1.  Prolonged chest discomfort possible unstable angina pectoris resolved  Secondary Discharge Diagnosis: 2.  Coronary artery disease with bypass graft disease 3.  Hypertension 4.  Hyperlipidemia with statin intolerance 5.  History of abdominal aneurysm repair 6.  Anxiety 7.  Hypokalemia 8.  Prior history of prostate cancer  Procedures:  Cardiac catheterization, echocardiogram  Hospital Course: This 74 year old male has a history of coronary artery disease.  He had a prior inferior wall infarction in March 1992 and is had multiple stents the right coronary artery eventually had bypass grafting in 2010.  He has had episodic burning discomfort in his throat and chest that his occurred for several times.  He was admitted in [place] with a severe episode earlier this year and has had visits to the hospital here for the same thing.  He noticed worsening dyspnea on exertion over the past few days and complained of a burning sensation in his epigastric area radiating up to his chest and throat around 6 PM the evening of admission.  He had shortness of breath with this and had burning in his abdomen.  He denied belching fever chills or vomiting and was admitted to the hospital for further evaluation.  His initial EKG showed PVCs and he was felt to be hypokalemic.  This was supplemented.  The PVCs improved.  The next morning he had one elevation of troponin but serial troponins were all negative.  It was thought that this could perhaps be a spurious artifact since it did not fit a normal pattern of troponin elevation.  He did not evolve any EKG changes.  Because of his worsening symptoms over the past year catheterization was advised.  At catheterization he had occlusion of the  LAD, distal circumflex, and distal right coronary artery bed.  The right coronary artery was patent but had a severe 99% mid vessel stenosis prior to a very small posterior descending artery.  Saphenous vein grafts were patent to a very small right ventricular branch, posterior lateral branch, and distal circumflex system.  The internal mammary graft was patent to the LAD.  He was felt to have occluded the vein graft to the posterior descending which was a very small vessel.  The films were reviewed later with an interventional cardiologist who did not feel this could be addressed with stenting.  He will be treated with intensive medical therapy.  His catheterization site was fine after the catheterization he was discharged home that evening.  I am not convinced that the symptoms that he had on admission are secondary to coronary ischemia.  At this point in time we will change him to Nexium 20 mg daily and supplement his potassium.  He will be followed up in one week.  Discharge Exam: Blood pressure 152/81, pulse 65, temperature 97.7 F (36.5 C), temperature source Oral, resp. rate 10, height 5\' 10"  (1.778 m), weight 91.9 kg (202 lb 9.6 oz), SpO2 96 %. Weight: 91.9 kg (202 lb 9.6 oz) Catheterization site clean and dry, lungs clear,  Labs: CBC:   Lab Results  Component Value Date   WBC 4.6 01/08/2015   HGB 15.1 01/08/2015   HCT 42.5 01/08/2015   MCV 86.7 01/08/2015   PLT 169 01/08/2015    CMP:  Recent Labs Lab 01/07/15 2345 01/08/15 0404  NA 138 139  K 3.2* 3.1*  CL 104 105  CO2 26 27  BUN 13 11  CREATININE 1.07 1.10  CALCIUM 9.4 9.3  PROT 6.3  --   BILITOT 0.9  --   ALKPHOS 80  --   ALT 61*  --   AST 40*  --   GLUCOSE 130* 124*    Lipid Panel     Component Value Date/Time   CHOL 195 01/08/2015 0404   TRIG 318* 01/08/2015 0404   HDL 29* 01/08/2015 0404   CHOLHDL 6.7 01/08/2015 0404   VLDL 64* 01/08/2015 0404   LDLCALC 102* 01/08/2015 0404    Cardiac  Enzymes:  Recent Labs  01/07/15 2345 01/08/15 0404 01/08/15 1055  TROPONINI 0.34* <0.03 <0.03   Thyroid: Lab Results  Component Value Date   TSH 1.859 01/07/2015    Hemoglobin A1C: Lab Results  Component Value Date   HGBA1C  04/28/2009    5.4 (NOTE) The ADA recommends the following therapeutic goal for glycemic control related to Hgb A1c measurement: Goal of therapy: <6.5 Hgb A1c  Reference: American Diabetes Association: Clinical Practice Recommendations 2010, Diabetes Care, 2010, 33: (Suppl  1).     Radiology: COPD without acute abnormality  EKG: Sinus rhythm with PVCs, right bundle branch block, Left axis deviation  Discharge Medications:   Medication List    STOP taking these medications        ibuprofen 200 MG tablet  Commonly known as:  ADVIL,MOTRIN     triamcinolone cream 0.1 %  Commonly known as:  KENALOG      TAKE these medications        acetaminophen 650 MG CR tablet  Commonly known as:  TYLENOL  Take 650 mg by mouth daily as needed for pain.     allopurinol 300 MG tablet  Commonly known as:  ZYLOPRIM  Take 300 mg by mouth every other day. Take in the evening - on even days of the month     amLODipine 5 MG tablet  Commonly known as:  NORVASC  Take 5 mg by mouth daily.     aspirin EC 81 MG tablet  Take 81 mg by mouth at bedtime.     clonazePAM 1 MG tablet  Commonly known as:  KLONOPIN  Take 0.5 mg by mouth at bedtime.     diltiazem 240 MG 24 hr capsule  Commonly known as:  CARDIZEM CD  Take 240 mg by mouth at bedtime.     esomeprazole 20 MG packet  Commonly known as:  NEXIUM  Take 20 mg by mouth at bedtime.     famotidine 20 MG tablet  Commonly known as:  PEPCID  Take 20 mg by mouth at bedtime.     Fish Oil 1200 MG Caps  Take 1,200 mg by mouth 2 (two) times daily.     fluticasone 50 MCG/ACT nasal spray  Commonly known as:  FLONASE  Place 2 sprays into both nostrils daily as needed for allergies.     FREESTYLE TEST VI      levothyroxine 137 MCG tablet  Commonly known as:  SYNTHROID, LEVOTHROID  Take 137 mcg by mouth daily before breakfast.     loratadine-pseudoephedrine 10-240 MG per 24 hr tablet  Commonly known as:  CLARITIN-D 24-hour  Take 1 tablet by mouth at bedtime.     metoprolol succinate 100 MG 24 hr tablet  Commonly known as:  TOPROL-XL  Take 1 tablet (100 mg total) by mouth daily.  Take with or immediately following a meal.     multivitamin with minerals Tabs tablet  Take 1 tablet by mouth at bedtime.     olmesartan-hydrochlorothiazide 40-25 MG per tablet  Commonly known as:  BENICAR HCT  Take 1 tablet by mouth daily.     potassium chloride SA 20 MEQ tablet  Commonly known as:  K-DUR,KLOR-CON  Take 1 tablet (20 mEq total) by mouth daily.     PRESERVISION AREDS 2 Caps  Take 1 capsule by mouth 2 (two) times daily.     tamsulosin 0.4 MG Caps capsule  Commonly known as:  FLOMAX  Take 0.4 mg by mouth every other day. Take in the evening - on even days of the month     vitamin B-12 1000 MCG tablet  Commonly known as:  CYANOCOBALAMIN  Take 1,000 mcg by mouth at bedtime.     Vitamin D3 2000 UNITS Tabs  Take 2,000 Units by mouth daily.         Followup plans and appointments: Follow-up Dr. Wynonia Lawman in one week.  Time spent with patient to include physician time:  45 minutes.   Signed: Kerry Hough. MD [facility] 01/08/2015, 5:36 PM

## 2015-01-08 NOTE — Progress Notes (Signed)
  Echocardiogram 2D Echocardiogram has been performed.  Garrett Jordan 01/08/2015, 10:51 AM

## 2015-01-08 NOTE — Progress Notes (Signed)
UR completed 

## 2015-01-08 NOTE — CV Procedure (Signed)
CARDIAC CATHETERIZATION REPORT   ADONIJAH KUBLY Sr.  DOB/Age:  1941/11/04  74 y.o. male  MRN: [id number]  Today's date: 01/08/2015   PROCEDURE:  Left heart catheterization with selective coronary angiography, bypass graft angiograms, and left ventriculogram.  INDICATIONS:  Recurrent chest pain suggestive of unstable angina pectoris in a patient 5 years for bypass grafting.  The risks, benefits, and details of the procedure were explained to the patient.  The patient verbalized understanding and wanted to proceed.  Informed written consent was obtained.  PROCEDURE TECHNIQUE:  After Xylocaine anesthesia a 17F sheath was placed in the right femoral artery with a single anterior needle wall stick.   Left coronary angiography was done using a Judkins L4 guide catheter.  Right coronary angiography was done using a Judkins R4 guide catheter.  The JR4 catheter was used to select the right ventricular branch graft and the internal mammary graft.  A right bypass catheter was used to select the graft to the posterior descending as well as the posterolateral branch..  The graft to the second marginal branch was selected using a left coronary bypass catheter.  Left ventriculography was done using a pigtail catheter. The patient tolerated the procedure well and was returned to the holding area for sheath removal.   CONTRAST:   90 cc.  ESTIMATED BLOOD LOSS:   Minimal   COMPLICATIONS:  None.    HEMODYNAMICS:  Aortic post contrast 144/85,  LV post contrast 144/9-15. There was no gradient between the left ventricle and aorta.    ANGIOGRAPHIC DATA:    CORONARY ARTERIES:   Arise and distribute normally.  Right dominant.  Significant coronary calcification is noted.  Left main coronary artery: Normal   Left anterior descending: Occluded proximally after a first diagonal branch.  The distal vessel fills by means of a patent mammary graft.  Circumflex coronary artery:  Widely patent first marginal branch without stenosis.  Second marginal branch is occluded and fills by means of a patent graft  Right coronary artery: Severe 99% stenosis in the midportion with slow antegrade flow that fills a small posterior descending artery.  The vessel is occluded afterwards.  The posterolateral branch fills by means of a patent graft.  A right ventricular branch fills by means of a patent graft..  SVG to right ventricular branch: Very small vessel which is patent.  There is appears to be a 60-70% distal stenosis but the vessel and graft to small estimated at 1 mm or less.    SVG to PDA: Occluded proximally.  The posterior descending fills off the native right coronary artery  SVG to posterolateral branch:  Scattered irregularities but widely patent.    SVG to OM2:  Widely patent  LIMA to LAD:  Widely patent   LEFT VENTRICULOGRAM:  Performed in the 30 RAO projection.  The aortic and mitral valves are normal. The left ventricle is normal in size with inferior hypokinesis.  Estimated ejection fraction is 55%  IMPRESSIONS:  1. Severe native three-vessel coronary artery disease 2.  Occlusion of the saphenous vein graft to very small posterior descending artery with stenosis in the right coronary artery graft prior to that in the place of a previous stent, the other grafts are widely patent 3.  Preserved LV function with inferior hypokinesis 4.  Moderately severe stenosis at the distal portion of a small graft to the right ventricular branch   RECOMMENDATION:  Continued medical therapy at the present time.  Review medications.  Ezzard Standing,  Brooke Bonito MD [facility] Cardiology

## 2015-01-08 NOTE — Progress Notes (Signed)
Subjective:  Admitted last night with burning substernal pain somewhat different than previous pain.  He has had 3 admissions now in the past year including one admission out-of-town.  He is quite anxious about his situation.  Prior to this he had a week's worth of exertional dyspnea when walking in from the parking lot.  Mildly hypokalemic.  Some PVCs noted.  Objective:  Vital Signs in the last 24 hours: BP 143/85 mmHg  Pulse 74  Temp(Src) 97.5 F (36.4 C) (Oral)  Resp 18  Ht 5\' 10"  (1.778 m)  Wt 92.262 kg (203 lb 6.4 oz)  BMI 29.18 kg/m2  SpO2 94%  Physical Exam: Anxious male who is tearful at times but in no acute distress Lungs:  Clear Cardiac:  Regular rhythm, normal S1 and S2, no S3 Extremities:  No edema present, femoral pulses are 2+  Intake/Output from previous day: 02/25 0701 - 02/26 0700 In: -  Out: 275 [Urine:275]  Weight Filed Weights   01/07/15 2236  Weight: 92.262 kg (203 lb 6.4 oz)    Lab Results: Basic Metabolic Panel:  Recent Labs  01/07/15 2345 01/08/15 0404  NA 138 139  K 3.2* 3.1*  CL 104 105  CO2 26 27  GLUCOSE 130* 124*  BUN 13 11  CREATININE 1.07 1.10   CBC:  Recent Labs  01/07/15 2345 01/08/15 0404  WBC 5.1 4.6  NEUTROABS 3.3  --   HGB 15.2 15.1  HCT 42.0 42.5  MCV 86.1 86.7  PLT 165 169   Cardiac Enzymes: Troponin (Point of Care Test)  Recent Labs  01/07/15 1856  TROPIPOC 0.00   Cardiac Panel (last 3 results)  Recent Labs  01/07/15 2345 01/08/15 0404  TROPONINI 0.34* <0.03    Telemetry: Sinus with PVCs  Assessment/Plan:  1.  Prolonged chest pain with one troponin abnormal but a subsequent one negative.  This may be a spurious result as the abnormal troponin's sandwich in between to completely negative ones.  Nonetheless he has had now 3 admissions with epigastric and substernal pain within the past year.  I think it would be best to help with future management to proceed with cardiac catheterization to  evaluate graft patency in light of progressive symptoms.  Cardiac catheterization was discussed with the patient fully including risks of myocardial infarction, death, stroke, bleeding, arrhythmia, dye allergy, renal insufficiency or bleeding.  The patient understands and is willing to proceed.  Possibility of acute intervention at the same setting is also discussed with the patient. 2.  Anxiety 3.  Coronary artery disease with previous bypass grafting  Plan: Will do catheterization later today.  Keep nothing by mouth.      Kerry Hough  MD [facility] Cardiology  01/08/2015, 8:57 AM

## 2015-01-08 NOTE — Interval H&P Note (Signed)
History and Physical Interval Note:  01/08/2015 12:58 PM  Garrett Meek Sr.  has presented today for surgery, with the diagnosis of cp  The various methods of treatment have been discussed with the patient and family. After consideration of risks, benefits and other options for treatment, the patient has consented to  Procedure(s): LEFT HEART CATHETERIZATION WITH CORONARY/GRAFT ANGIOGRAM (N/A) as a surgical intervention .  The patient's history has been reviewed, patient examined, no change in status, stable for surgery.  I have reviewed the patient's chart and labs.  Questions were answered to the patient's satisfaction.     Kohl Polinsky JR,W SPENCER

## 2015-01-08 NOTE — H&P (View-Only) (Signed)
Subjective:  Admitted last night with burning substernal pain somewhat different than previous pain.  He has had 3 admissions now in the past year including one admission out-of-town.  He is quite anxious about his situation.  Prior to this he had a week's worth of exertional dyspnea when walking in from the parking lot.  Mildly hypokalemic.  Some PVCs noted.  Objective:  Vital Signs in the last 24 hours: BP 143/85 mmHg  Pulse 74  Temp(Src) 97.5 F (36.4 C) (Oral)  Resp 18  Ht 5\' 10"  (1.778 m)  Wt 92.262 kg (203 lb 6.4 oz)  BMI 29.18 kg/m2  SpO2 94%  Physical Exam: Anxious male who is tearful at times but in no acute distress Lungs:  Clear Cardiac:  Regular rhythm, normal S1 and S2, no S3 Extremities:  No edema present, femoral pulses are 2+  Intake/Output from previous day: 02/25 0701 - 02/26 0700 In: -  Out: 275 [Urine:275]  Weight Filed Weights   01/07/15 2236  Weight: 92.262 kg (203 lb 6.4 oz)    Lab Results: Basic Metabolic Panel:  Recent Labs  01/07/15 2345 01/08/15 0404  NA 138 139  K 3.2* 3.1*  CL 104 105  CO2 26 27  GLUCOSE 130* 124*  BUN 13 11  CREATININE 1.07 1.10   CBC:  Recent Labs  01/07/15 2345 01/08/15 0404  WBC 5.1 4.6  NEUTROABS 3.3  --   HGB 15.2 15.1  HCT 42.0 42.5  MCV 86.1 86.7  PLT 165 169   Cardiac Enzymes: Troponin (Point of Care Test)  Recent Labs  01/07/15 1856  TROPIPOC 0.00   Cardiac Panel (last 3 results)  Recent Labs  01/07/15 2345 01/08/15 0404  TROPONINI 0.34* <0.03    Telemetry: Sinus with PVCs  Assessment/Plan:  1.  Prolonged chest pain with one troponin abnormal but a subsequent one negative.  This may be a spurious result as the abnormal troponin's sandwich in between to completely negative ones.  Nonetheless he has had now 3 admissions with epigastric and substernal pain within the past year.  I think it would be best to help with future management to proceed with cardiac catheterization to  evaluate graft patency in light of progressive symptoms.  Cardiac catheterization was discussed with the patient fully including risks of myocardial infarction, death, stroke, bleeding, arrhythmia, dye allergy, renal insufficiency or bleeding.  The patient understands and is willing to proceed.  Possibility of acute intervention at the same setting is also discussed with the patient. 2.  Anxiety 3.  Coronary artery disease with previous bypass grafting  Plan: Will do catheterization later today.  Keep nothing by mouth.      Kerry Hough  MD [facility] Cardiology  01/08/2015, 8:57 AM

## 2015-01-08 NOTE — Progress Notes (Signed)
Site area: RFA Site Prior to Removal:  Level 0 Pressure Applied For:30 min Manual:   yes Patient Status During Pull:  stable Post Pull Site:  Level o Post Pull Instructions Given:  yes Post Pull Pulses Present: palpable Dressing Applied:  clear Bedrest begins @ 1440 Comments:

## 2015-01-09 ENCOUNTER — Encounter (HOSPITAL_COMMUNITY): Payer: Self-pay | Admitting: Cardiology

## 2015-01-09 LAB — HEMOGLOBIN A1C
Hgb A1c MFr Bld: 6 % — ABNORMAL HIGH (ref 4.8–5.6)
MEAN PLASMA GLUCOSE: 126 mg/dL

## 2015-02-08 ENCOUNTER — Telehealth (HOSPITAL_COMMUNITY): Payer: Self-pay | Admitting: Cardiac Rehabilitation

## 2015-02-08 NOTE — Telephone Encounter (Signed)
-----   Message from Serafina Mitchell, MD sent at 02/08/2015  3:06 PM EDT ----- Regarding: RE: cardiac rehab No restrictions ----- Message -----    From: Lowell Guitar, RN    Sent: 02/05/2015  10:25 AM      To: Serafina Mitchell, MD Subject: cardiac rehab                                  Dear Dr. Trula Slade,  Pt is interested in participating in cardiac rehab.  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    Are there any activity restrictions for pt, such as weight or treadmill incline restrictions?  What are appropriate resting and exercise BP parameters at cardiac rehab?  Thank you, Andi Hence, RN, BSN Cardiac Pulmonary Rehab

## 2015-02-11 ENCOUNTER — Ambulatory Visit (HOSPITAL_COMMUNITY): Payer: Medicare Other

## 2015-02-15 ENCOUNTER — Ambulatory Visit (HOSPITAL_COMMUNITY): Payer: Medicare Other

## 2015-02-17 ENCOUNTER — Ambulatory Visit (HOSPITAL_COMMUNITY): Payer: Medicare Other

## 2015-02-19 ENCOUNTER — Ambulatory Visit (HOSPITAL_COMMUNITY): Payer: Medicare Other

## 2015-02-22 ENCOUNTER — Ambulatory Visit (HOSPITAL_COMMUNITY): Payer: Medicare Other

## 2015-02-24 ENCOUNTER — Ambulatory Visit (HOSPITAL_COMMUNITY): Payer: Medicare Other

## 2015-02-26 ENCOUNTER — Ambulatory Visit (HOSPITAL_COMMUNITY): Payer: Medicare Other

## 2015-03-01 ENCOUNTER — Ambulatory Visit (HOSPITAL_COMMUNITY): Payer: Medicare Other

## 2015-03-03 ENCOUNTER — Ambulatory Visit (HOSPITAL_COMMUNITY): Payer: Medicare Other

## 2015-03-05 ENCOUNTER — Ambulatory Visit (HOSPITAL_COMMUNITY): Payer: Medicare Other

## 2015-03-08 ENCOUNTER — Ambulatory Visit (HOSPITAL_COMMUNITY): Payer: Medicare Other

## 2015-03-10 ENCOUNTER — Ambulatory Visit (HOSPITAL_COMMUNITY): Payer: Medicare Other

## 2015-03-12 ENCOUNTER — Ambulatory Visit (HOSPITAL_COMMUNITY): Payer: Medicare Other

## 2015-03-15 ENCOUNTER — Ambulatory Visit (HOSPITAL_COMMUNITY): Payer: Medicare Other

## 2015-03-17 ENCOUNTER — Ambulatory Visit (HOSPITAL_COMMUNITY): Payer: Medicare Other

## 2015-03-19 ENCOUNTER — Ambulatory Visit (HOSPITAL_COMMUNITY): Payer: Medicare Other

## 2015-03-22 ENCOUNTER — Ambulatory Visit (HOSPITAL_COMMUNITY): Payer: Medicare Other

## 2015-03-24 ENCOUNTER — Ambulatory Visit (HOSPITAL_COMMUNITY): Payer: Medicare Other

## 2015-03-26 ENCOUNTER — Ambulatory Visit (HOSPITAL_COMMUNITY): Payer: Medicare Other

## 2015-03-29 ENCOUNTER — Ambulatory Visit (HOSPITAL_COMMUNITY): Payer: Medicare Other

## 2015-03-31 ENCOUNTER — Ambulatory Visit (HOSPITAL_COMMUNITY): Payer: Medicare Other

## 2015-04-02 ENCOUNTER — Ambulatory Visit (HOSPITAL_COMMUNITY): Payer: Medicare Other

## 2015-04-05 ENCOUNTER — Ambulatory Visit (HOSPITAL_COMMUNITY): Payer: Medicare Other

## 2015-04-06 ENCOUNTER — Ambulatory Visit
Admission: RE | Admit: 2015-04-06 | Discharge: 2015-04-06 | Disposition: A | Discharge status: Home / Self Care | Payer: Medicare Other | Source: Ambulatory Visit | Attending: Family Medicine | Admitting: Family Medicine

## 2015-04-06 ENCOUNTER — Other Ambulatory Visit: Payer: Self-pay | Admitting: Family Medicine

## 2015-04-06 DIAGNOSIS — M25551 Pain in right hip: Secondary | ICD-10-CM

## 2015-04-07 ENCOUNTER — Ambulatory Visit (HOSPITAL_COMMUNITY): Payer: Medicare Other

## 2015-04-09 ENCOUNTER — Ambulatory Visit (HOSPITAL_COMMUNITY): Payer: Medicare Other

## 2015-04-14 ENCOUNTER — Ambulatory Visit (HOSPITAL_COMMUNITY): Payer: Medicare Other

## 2015-04-16 ENCOUNTER — Ambulatory Visit (HOSPITAL_COMMUNITY): Payer: Medicare Other

## 2015-04-19 ENCOUNTER — Ambulatory Visit (HOSPITAL_COMMUNITY): Payer: Medicare Other

## 2015-04-21 ENCOUNTER — Encounter (HOSPITAL_COMMUNITY)
Admission: RE | Admit: 2015-04-21 | Discharge: 2015-04-21 | Disposition: A | Discharge status: Home / Self Care | Payer: Self-pay | Source: Ambulatory Visit | Attending: Cardiology | Admitting: Cardiology

## 2015-04-21 ENCOUNTER — Ambulatory Visit (HOSPITAL_COMMUNITY): Payer: Medicare Other

## 2015-04-21 DIAGNOSIS — I209 Angina pectoris, unspecified: Secondary | ICD-10-CM | POA: Insufficient documentation

## 2015-04-23 ENCOUNTER — Encounter (HOSPITAL_COMMUNITY)
Admission: RE | Admit: 2015-04-23 | Discharge: 2015-04-23 | Disposition: A | Discharge status: Home / Self Care | Payer: Self-pay | Source: Ambulatory Visit | Attending: Cardiology | Admitting: Cardiology

## 2015-04-23 ENCOUNTER — Ambulatory Visit (HOSPITAL_COMMUNITY): Payer: Medicare Other

## 2015-04-26 ENCOUNTER — Encounter (HOSPITAL_COMMUNITY)
Admission: RE | Admit: 2015-04-26 | Discharge: 2015-04-26 | Disposition: A | Discharge status: Home / Self Care | Payer: Self-pay | Source: Ambulatory Visit | Attending: Cardiology | Admitting: Cardiology

## 2015-04-26 ENCOUNTER — Ambulatory Visit (HOSPITAL_COMMUNITY): Payer: Medicare Other

## 2015-04-28 ENCOUNTER — Ambulatory Visit (HOSPITAL_COMMUNITY): Payer: Medicare Other

## 2015-04-28 ENCOUNTER — Encounter (HOSPITAL_COMMUNITY)
Admission: RE | Admit: 2015-04-28 | Discharge: 2015-04-28 | Disposition: A | Discharge status: Home / Self Care | Payer: Self-pay | Source: Ambulatory Visit | Attending: Cardiology | Admitting: Cardiology

## 2015-04-30 ENCOUNTER — Ambulatory Visit (HOSPITAL_COMMUNITY): Payer: Medicare Other

## 2015-04-30 ENCOUNTER — Encounter (HOSPITAL_COMMUNITY)
Admission: RE | Admit: 2015-04-30 | Discharge: 2015-04-30 | Disposition: A | Discharge status: Home / Self Care | Payer: Self-pay | Source: Ambulatory Visit | Attending: Cardiology | Admitting: Cardiology

## 2015-05-03 ENCOUNTER — Ambulatory Visit (HOSPITAL_COMMUNITY): Payer: Medicare Other

## 2015-05-03 ENCOUNTER — Encounter (HOSPITAL_COMMUNITY)
Admission: RE | Admit: 2015-05-03 | Discharge: 2015-05-03 | Disposition: A | Discharge status: Home / Self Care | Payer: Self-pay | Source: Ambulatory Visit | Attending: Cardiology | Admitting: Cardiology

## 2015-05-05 ENCOUNTER — Encounter (HOSPITAL_COMMUNITY)
Admission: RE | Admit: 2015-05-05 | Discharge: 2015-05-05 | Disposition: A | Discharge status: Home / Self Care | Payer: Self-pay | Source: Ambulatory Visit | Attending: Cardiology | Admitting: Cardiology

## 2015-05-05 ENCOUNTER — Ambulatory Visit (HOSPITAL_COMMUNITY): Payer: Medicare Other

## 2015-05-07 ENCOUNTER — Encounter (HOSPITAL_COMMUNITY)
Admission: RE | Admit: 2015-05-07 | Discharge: 2015-05-07 | Disposition: A | Discharge status: Home / Self Care | Payer: Self-pay | Source: Ambulatory Visit | Attending: Cardiology | Admitting: Cardiology

## 2015-05-07 ENCOUNTER — Ambulatory Visit (HOSPITAL_COMMUNITY): Payer: Medicare Other

## 2015-05-10 ENCOUNTER — Encounter (HOSPITAL_COMMUNITY)
Admission: RE | Admit: 2015-05-10 | Discharge: 2015-05-10 | Disposition: A | Discharge status: Home / Self Care | Payer: Self-pay | Source: Ambulatory Visit | Attending: Cardiology | Admitting: Cardiology

## 2015-05-10 ENCOUNTER — Ambulatory Visit (HOSPITAL_COMMUNITY): Payer: Medicare Other

## 2015-05-12 ENCOUNTER — Ambulatory Visit (HOSPITAL_COMMUNITY): Payer: Medicare Other

## 2015-05-12 ENCOUNTER — Encounter (HOSPITAL_COMMUNITY)
Admission: RE | Admit: 2015-05-12 | Discharge: 2015-05-12 | Disposition: A | Discharge status: Home / Self Care | Payer: Self-pay | Source: Ambulatory Visit | Attending: Cardiology | Admitting: Cardiology

## 2015-05-12 NOTE — Progress Notes (Signed)
Reviewed home exercise guidelines with patient including endpoints, temperature precautions, target heart rate and rate of perceived exertion. Pt plans to walk as his mode of home exercise. Pt voices understanding of instructions given. Sol Passer, MS, ACSM CCEP

## 2015-05-14 ENCOUNTER — Encounter (HOSPITAL_COMMUNITY)
Admission: RE | Admit: 2015-05-14 | Discharge: 2015-05-14 | Disposition: A | Discharge status: Home / Self Care | Payer: Self-pay | Source: Ambulatory Visit | Attending: Cardiology | Admitting: Cardiology

## 2015-05-14 ENCOUNTER — Ambulatory Visit (HOSPITAL_COMMUNITY): Payer: Medicare Other

## 2015-05-14 DIAGNOSIS — I209 Angina pectoris, unspecified: Secondary | ICD-10-CM | POA: Insufficient documentation

## 2015-05-19 ENCOUNTER — Encounter (HOSPITAL_COMMUNITY)
Admission: RE | Admit: 2015-05-19 | Discharge: 2015-05-19 | Disposition: A | Discharge status: Home / Self Care | Payer: Self-pay | Source: Ambulatory Visit | Attending: Cardiology | Admitting: Cardiology

## 2015-05-19 ENCOUNTER — Ambulatory Visit (HOSPITAL_COMMUNITY): Payer: Medicare Other

## 2015-05-20 ENCOUNTER — Encounter: Payer: Self-pay | Admitting: Surgery

## 2015-05-21 ENCOUNTER — Ambulatory Visit (HOSPITAL_COMMUNITY): Payer: Medicare Other

## 2015-05-21 ENCOUNTER — Other Ambulatory Visit: Payer: Self-pay | Admitting: *Deleted

## 2015-05-21 ENCOUNTER — Encounter (HOSPITAL_COMMUNITY)
Admission: RE | Admit: 2015-05-21 | Discharge: 2015-05-21 | Disposition: A | Discharge status: Home / Self Care | Payer: Self-pay | Source: Ambulatory Visit | Attending: Cardiology | Admitting: Cardiology

## 2015-05-21 DIAGNOSIS — Z95828 Presence of other vascular implants and grafts: Secondary | ICD-10-CM

## 2015-05-21 DIAGNOSIS — I714 Abdominal aortic aneurysm, without rupture, unspecified: Secondary | ICD-10-CM

## 2015-05-24 ENCOUNTER — Encounter: Payer: Self-pay | Admitting: Surgery

## 2015-05-24 ENCOUNTER — Ambulatory Visit
Admission: RE | Admit: 2015-05-24 | Discharge: 2015-05-24 | Disposition: A | Discharge status: Home / Self Care | Payer: Medicare Other | Source: Ambulatory Visit | Attending: Family | Admitting: Family

## 2015-05-24 ENCOUNTER — Encounter (HOSPITAL_COMMUNITY): Payer: Self-pay

## 2015-05-24 ENCOUNTER — Ambulatory Visit (INDEPENDENT_AMBULATORY_CARE_PROVIDER_SITE_OTHER): Payer: Medicare Other | Admitting: Surgery

## 2015-05-24 VITALS — BP 131/76 | HR 63 | Ht 70.0 in | Wt 207.7 lb

## 2015-05-24 DIAGNOSIS — I714 Abdominal aortic aneurysm, without rupture, unspecified: Secondary | ICD-10-CM

## 2015-05-24 DIAGNOSIS — Z48812 Encounter for surgical aftercare following surgery on the circulatory system: Secondary | ICD-10-CM | POA: Diagnosis not present

## 2015-05-24 DIAGNOSIS — Z95828 Presence of other vascular implants and grafts: Secondary | ICD-10-CM

## 2015-05-24 LAB — CREATININE, SERUM: Creat: 1.3 mg/dL (ref 0.50–1.35)

## 2015-05-24 LAB — BUN: BUN: 20 mg/dL (ref 6–23)

## 2015-05-24 MED ORDER — IOPAMIDOL (ISOVUE-370) INJECTION 76%
75.0000 mL | Freq: Once | INTRAVENOUS | Status: AC | PRN
  Administered 2015-05-24: 75 mL via INTRAVENOUS

## 2015-05-24 NOTE — Progress Notes (Signed)
Patient name: Garrett STICKELS Sr. MRN: [id number] DOB: 05-Jul-1941 Sex: male     Chief Complaint  Patient presents with  . Re-evaluation    6 month f/u AAA w/ CTA     HISTORY OF PRESENT ILLNESS: The patient is back today for followup of his abdominal aortic aneurysm. He initially underwent coil embolization of his right hypogastric artery. He developed severe buttock claudication therefore his aneurysm repair was delayed. His symptoms of claudication gradually improved and on 08/08/2012 he underwent endovascular repair of his aneurysm. 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  The patient has recently been diagnosed with bursitis of his right hip which is causing him some difficulties.  He also gets occasional numbness on the top of his right foot which he attributes to his foot drop surgery.  Past Medical History  Diagnosis Date  . AAA (abdominal aortic aneurysm)   . CAD (coronary artery disease)   . Hypertension   . Hyperlipidemia   . Thyroid disease     hypothyroidism  . Reflux   . Gout   . Nerve compression     right leg  . Hypothyroidism   . Seasonal allergies   . GERD (gastroesophageal reflux disease)   . Myocardial infarction 01/1991    sp inferior  . Atrial fibrillation   . Kidney stone     right kidney  . Cancer     prostate  . Arthritis     knees  . 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 Parathyroid adenoma    . Spine surgery    . Kidney stone surgery    . Tonsillectomy    . Prostate surgery      rad seeds  . Coronary stent placement  1992 and  2009    RCA  . Abdominal aortagram embolization  02/28/2012  . Coronary artery bypass graft  04/2009  . Abdominal aortic aneurysm repair  Sept. 2013  . Abdominal aortagram N/A 02/28/2012    Procedure:  ABDOMINAL Maxcine Ham;  Surgeon: Serafina Mitchell, MD;  Location: [facility] CATH LAB;  Service: Cardiovascular;  Laterality: N/A;  . Embolization Right 02/28/2012    Procedure: EMBOLIZATION;  Surgeon: Serafina Mitchell, MD;  Location: [facility] CATH LAB;  Service: Cardiovascular;  Laterality: Right;  . Left heart catheterization with coronary/graft angiogram N/A 01/08/2015    Procedure: LEFT HEART CATHETERIZATION WITH Beatrix Fetters;  Surgeon: Jacolyn Reedy, MD;  Location: [facility] CATH LAB;  Service: Cardiovascular;  Laterality: N/A;    History   Social History  . Marital Status: Divorced    Spouse Name: N/A  . Number of Children: N/A  . Years of Education: N/A   Occupational History  . Not on file.   Social History Main Topics  . Smoking status: Former Smoker    Types: Cigarettes    Quit date: 08/01/1970  . Smokeless tobacco: Never Used  . Alcohol Use: 3.6 - 5.4 oz/week    4-5 Glasses of wine, 2-4 Cans of beer per week     Comment: 1-3 glasses 3-4 times a week  . Drug Use: No  . Sexual Activity: Not on file   Other Topics Concern  . Not on file   Social History Narrative  Family History  Problem Relation Age of Onset  . Heart disease Father     Heart Disease before age 77  . Heart attack Father   . Hyperlipidemia Father   . Hypertension Father   . Stroke Mother   . Deep vein thrombosis Mother   . Heart disease Sister   . Diabetes Sister   . Hyperlipidemia Sister   . Hypertension Sister   . Heart attack Sister   . Heart disease Paternal Uncle   . Diabetes Maternal Grandmother   . Diabetes Sister     Allergies as of 05/24/2015 - Review Complete 05/24/2015  Allergen Reaction Noted  . Ace inhibitors Cough 07/03/2011  . Crestor [rosuvastatin] Other (See Comments) 03/29/2014  . Nitroglycerin Other (See Comments) 08/21/2011    Current Outpatient Prescriptions on File Prior to Visit  Medication Sig Dispense Refill  . acetaminophen (TYLENOL) 650 MG CR tablet Take 650 mg by  mouth daily as needed for pain.     Marland Kitchen allopurinol (ZYLOPRIM) 300 MG tablet Take 300 mg by mouth every other day. Take in the evening - on even days of the month    . amLODipine (NORVASC) 5 MG tablet Take 5 mg by mouth daily.     Marland Kitchen aspirin EC 81 MG tablet Take 81 mg by mouth at bedtime.     . Cholecalciferol (VITAMIN D3) 2000 UNITS TABS Take 2,000 Units by mouth daily.     . clonazePAM (KLONOPIN) 1 MG tablet Take 0.5 mg by mouth at bedtime.     Marland Kitchen diltiazem (CARDIZEM CD) 240 MG 24 hr capsule Take 240 mg by mouth at bedtime.     Marland Kitchen esomeprazole (NEXIUM) 20 MG packet Take 20 mg by mouth at bedtime. 30 each 12  . fluticasone (FLONASE) 50 MCG/ACT nasal spray Place 2 sprays into both nostrils daily as needed for allergies.     . Glucose Blood (FREESTYLE TEST VI)     . levothyroxine (SYNTHROID, LEVOTHROID) 137 MCG tablet Take 137 mcg by mouth daily before breakfast.    . loratadine-pseudoephedrine (CLARITIN-D 24-HOUR) 10-240 MG per 24 hr tablet Take 1 tablet by mouth at bedtime.     . metoprolol succinate (TOPROL-XL) 100 MG 24 hr tablet Take 1 tablet (100 mg total) by mouth daily. Take with or immediately following a meal. 30 tablet 12  . Multiple Vitamin (MULTIVITAMIN WITH MINERALS) TABS Take 1 tablet by mouth at bedtime.     . Multiple Vitamins-Minerals (PRESERVISION AREDS 2) CAPS Take 1 capsule by mouth 2 (two) times daily.    Marland Kitchen olmesartan-hydrochlorothiazide (BENICAR HCT) 40-25 MG per tablet Take 1 tablet by mouth daily.     . Omega-3 Fatty Acids (FISH OIL) 1200 MG CAPS Take 1,200 mg by mouth 2 (two) times daily.    . potassium chloride SA (K-DUR,KLOR-CON) 20 MEQ tablet Take 1 tablet (20 mEq total) by mouth daily. 30 tablet 12  . rosuvastatin (CRESTOR) 10 MG tablet Take 10 mg by mouth as directed. Per patient, takes Mondays & Thursdays    . Tamsulosin HCl (FLOMAX) 0.4 MG CAPS Take 0.4 mg by mouth every other day. Take in the evening - on even days of the month    . vitamin B-12 (CYANOCOBALAMIN) 1000  MCG tablet Take 1,000 mcg by mouth at bedtime.     . famotidine (PEPCID) 20 MG tablet Take 20 mg by mouth at bedtime.      No current facility-administered medications on file prior to visit.  REVIEW OF SYSTEMS: See history of present illness, otherwise negative  PHYSICAL EXAMINATION:   Vital signs are  Filed Vitals:   05/24/15 1400  BP: 131/76  Pulse: 63  Height: 5\' 10"  (1.778 m)  Weight: 207 lb 11.2 oz (94.212 kg)  SpO2: 93%   Body mass index is 29.8 kg/(m^2). General: The patient appears their stated age. HEENT:  No gross abnormalities Pulmonary:  Non labored breathing Musculoskeletal: There are no major deformities. Neurologic: No focal weakness or paresthesias are detected, Skin: There are no ulcer or rashes noted. Psychiatric: The patient has normal affect. Cardiovascular: There is a regular rate and rhythm without significant murmur appreciated.  Palpable pedal pulse on the right   Diagnostic Studies I have reviewed his CT scan with the following findings: Slight enlargement of aortic aneurysm sac size up to 4.8 x 6.0 cm. A type 2 endoleak is again re- demonstrated by CTA primarily supplied by the inferior mesenteric artery. There may also be contribution from a left L3 lumbar artery. 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  Maximum diameter of the aneurysm is now 6 cm.  I discussed with the patient that I feel it is time to have this addressed.  I'm going to refer him to interventional radiology for evaluation of embolization of his type II endoleak.  Eldridge Abrahams,  M.D. [facility] Office: [phone] Pager:  [phone]

## 2015-05-24 NOTE — Addendum Note (Signed)
Addended by: Dorthula Rue L on: 05/24/2015 03:00 PM   Modules accepted: Orders

## 2015-05-25 ENCOUNTER — Other Ambulatory Visit: Payer: Self-pay

## 2015-05-25 DIAGNOSIS — T82330A Leakage of aortic (bifurcation) graft (replacement), initial encounter: Secondary | ICD-10-CM

## 2015-05-25 DIAGNOSIS — I714 Abdominal aortic aneurysm, without rupture, unspecified: Secondary | ICD-10-CM

## 2015-05-25 DIAGNOSIS — IMO0001 Reserved for inherently not codable concepts without codable children: Secondary | ICD-10-CM

## 2015-05-26 ENCOUNTER — Encounter (HOSPITAL_COMMUNITY)
Admission: RE | Admit: 2015-05-26 | Discharge: 2015-05-26 | Disposition: A | Discharge status: Home / Self Care | Payer: Self-pay | Source: Ambulatory Visit | Attending: Cardiology | Admitting: Cardiology

## 2015-05-28 ENCOUNTER — Encounter (HOSPITAL_COMMUNITY)
Admission: RE | Admit: 2015-05-28 | Discharge: 2015-05-28 | Disposition: A | Discharge status: Home / Self Care | Payer: Self-pay | Source: Ambulatory Visit | Attending: Cardiology | Admitting: Cardiology

## 2015-05-31 ENCOUNTER — Encounter (HOSPITAL_COMMUNITY)
Admission: RE | Admit: 2015-05-31 | Discharge: 2015-05-31 | Disposition: A | Discharge status: Home / Self Care | Payer: Self-pay | Source: Ambulatory Visit | Attending: Cardiology | Admitting: Cardiology

## 2015-06-01 ENCOUNTER — Ambulatory Visit
Admission: RE | Admit: 2015-06-01 | Discharge: 2015-06-01 | Disposition: A | Discharge status: Home / Self Care | Payer: Medicare Other | Source: Ambulatory Visit | Attending: Surgery | Admitting: Surgery

## 2015-06-01 DIAGNOSIS — T82330A Leakage of aortic (bifurcation) graft (replacement), initial encounter: Secondary | ICD-10-CM

## 2015-06-01 DIAGNOSIS — I714 Abdominal aortic aneurysm, without rupture, unspecified: Secondary | ICD-10-CM | POA: Insufficient documentation

## 2015-06-01 DIAGNOSIS — IMO0001 Reserved for inherently not codable concepts without codable children: Secondary | ICD-10-CM

## 2015-06-01 NOTE — Consult Note (Signed)
Chief Complaint: Patient was seen in consultation today for  Chief Complaint  Patient presents with  . Advice Only    Consult for Type 2 Endoleak      at the request of Brabham,Vance W  Referring Physician(s): Harold Barban W  History of Present Illness: Garrett Maben. is a 74 y.o. male with a history of abdominal aortic aneurysm status post endovascular aortic repair with bifurcated stent graft in September 2013. 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 His aneurysm sac has demonstrated interval growth most recently measuring 4.8 x 6.0 cm compared to 4.5 x 5.67 m on the prior study.  He is currently asymptomatic. Specifically, he denies abdominal or back pain. He denies chest pain, shortness of breath, lightheadedness or other systemic symptoms. He has had several arteriograms in the past.  Past Medical History  Diagnosis Date  . AAA (abdominal aortic aneurysm)   . CAD (coronary artery disease)   . Hypertension   . Hyperlipidemia   . Thyroid disease     hypothyroidism  . Reflux   . Gout   . Nerve compression     right leg  . Hypothyroidism   . Seasonal allergies   . GERD (gastroesophageal reflux disease)   . Myocardial infarction 01/1991    sp inferior  . Atrial fibrillation   . Kidney stone     right kidney  . Cancer     prostate  . Arthritis     knees  . 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 Parathyroid adenoma    . Spine surgery    . Kidney stone surgery    . Tonsillectomy    . Prostate surgery      rad seeds  . Coronary stent placement  1992 and  2009    RCA  . Abdominal aortagram embolization  02/28/2012  . Coronary artery bypass graft  04/2009  . Abdominal aortic aneurysm repair  Sept. 2013  . Abdominal aortagram N/A 02/28/2012    Procedure: ABDOMINAL Maxcine Ham;  Surgeon: Serafina Mitchell, MD;   Location: [facility] CATH LAB;  Service: Cardiovascular;  Laterality: N/A;  . Embolization Right 02/28/2012    Procedure: EMBOLIZATION;  Surgeon: Serafina Mitchell, MD;  Location: [facility] CATH LAB;  Service: Cardiovascular;  Laterality: Right;  . Left heart catheterization with coronary/graft angiogram N/A 01/08/2015    Procedure: LEFT HEART CATHETERIZATION WITH Beatrix Fetters;  Surgeon: Jacolyn Reedy, MD;  Location: [facility] CATH LAB;  Service: Cardiovascular;  Laterality: N/A;    Allergies: Ace inhibitors; Crestor; and Nitroglycerin  Medications: Prior to Admission medications   Medication Sig Start Date End Date Taking? Authorizing Provider  acetaminophen (TYLENOL) 650 MG CR tablet Take 650 mg by mouth daily as needed for pain.     Historical Provider, MD  allopurinol (ZYLOPRIM) 300 MG tablet Take 300 mg by mouth every other day. Take in the evening - on even days of the month    Historical Provider, MD  amLODipine (NORVASC) 5 MG tablet Take 10 mg by mouth daily.     Historical Provider, MD  aspirin EC 81 MG tablet Take 81 mg by mouth at bedtime.     Historical Provider, MD  Cholecalciferol (VITAMIN D3) 2000 UNITS TABS Take 2,000 Units by mouth daily.     Historical Provider, MD  clonazePAM (  KLONOPIN) 1 MG tablet Take 0.5 mg by mouth at bedtime.     Historical Provider, MD  clopidogrel (PLAVIX) 75 MG tablet Take 75 mg by mouth daily. 03/09/15   Historical Provider, MD  diltiazem (CARDIZEM CD) 240 MG 24 hr capsule Take 240 mg by mouth at bedtime.     Historical Provider, MD  esomeprazole (NEXIUM) 20 MG packet Take 20 mg by mouth at bedtime. 01/08/15   Jacolyn Reedy, MD  famotidine (PEPCID) 20 MG tablet Take 20 mg by mouth at bedtime.     Historical Provider, MD  fluticasone (FLONASE) 50 MCG/ACT nasal spray Place 2 sprays into both nostrils daily as needed for allergies.     Historical Provider, MD  Glucose Blood (FREESTYLE TEST VI)  04/03/13   Historical Provider, MD  levothyroxine (SYNTHROID,  LEVOTHROID) 137 MCG tablet Take 137 mcg by mouth daily before breakfast.    Historical Provider, MD  loratadine-pseudoephedrine (CLARITIN-D 24-HOUR) 10-240 MG per 24 hr tablet Take 1 tablet by mouth at bedtime.     Historical Provider, MD  metoprolol succinate (TOPROL-XL) 100 MG 24 hr tablet Take 1 tablet (100 mg total) by mouth daily. Take with or immediately following a meal. 01/08/15   Jacolyn Reedy, MD  Multiple Vitamin (MULTIVITAMIN WITH MINERALS) TABS Take 1 tablet by mouth at bedtime.     Historical Provider, MD  Multiple Vitamins-Minerals (PRESERVISION AREDS 2) CAPS Take 1 capsule by mouth 2 (two) times daily.    Historical Provider, MD  olmesartan-hydrochlorothiazide (BENICAR HCT) 40-25 MG per tablet Take 1 tablet by mouth daily.     Historical Provider, MD  Omega-3 Fatty Acids (FISH OIL) 1200 MG CAPS Take 1,200 mg by mouth 2 (two) times daily.    Historical Provider, MD  potassium chloride SA (K-DUR,KLOR-CON) 20 MEQ tablet Take 1 tablet (20 mEq total) by mouth daily. 01/08/15   Jacolyn Reedy, MD  rosuvastatin (CRESTOR) 10 MG tablet Take 10 mg by mouth as directed. Per patient, takes Mondays & Thursdays    Historical Provider, MD  Tamsulosin HCl (FLOMAX) 0.4 MG CAPS Take 0.4 mg by mouth every other day. Take in the evening - on even days of the month    Historical Provider, MD  vitamin B-12 (CYANOCOBALAMIN) 1000 MCG tablet Take 1,000 mcg by mouth at bedtime.     Historical Provider, MD     Family History  Problem Relation Age of Onset  . Heart disease Father     Heart Disease before age 71  . Heart attack Father   . Hyperlipidemia Father   . Hypertension Father   . Stroke Mother   . Deep vein thrombosis Mother   . Heart disease Sister   . Diabetes Sister   . Hyperlipidemia Sister   . Hypertension Sister   . Heart attack Sister   . Heart disease Paternal Uncle   . Diabetes Maternal Grandmother   . Diabetes Sister     History   Social History  . Marital Status: Divorced      Spouse Name: N/A  . Number of Children: N/A  . Years of Education: N/A   Social History Main Topics  . Smoking status: Former Smoker    Types: Cigarettes    Quit date: 08/01/1970  . Smokeless tobacco: Never Used  . Alcohol Use: 3.6 - 5.4 oz/week    4-5 Glasses of wine, 2-4 Cans of beer per week     Comment: 1-3 glasses 3-4 times a week  .  Drug Use: No  . Sexual Activity: Not on file   Other Topics Concern  . Not on file   Social History Narrative   Review of Systems: A 12 point ROS discussed and pertinent positives are indicated in the HPI above.  All other systems are negative.  Review of Systems  Vital Signs: BP 132/79 mmHg  Pulse 59  Temp(Src) 97.6 F (36.4 C) (Oral)  Resp 14  Ht 5\' 10"  (1.778 m)  Wt 207 lb (93.895 kg)  BMI 29.70 kg/m2  SpO2 95%  Physical Exam  Constitutional: He is oriented to person, place, and time. He appears well-developed and well-nourished. No distress.  HENT:  Head: Normocephalic and atraumatic.  Eyes: No scleral icterus.  Cardiovascular: Normal rate and regular rhythm.   Pulmonary/Chest: Effort normal.  Abdominal: Soft. He exhibits no distension. There is no tenderness.  Neurological: He is alert and oriented to person, place, and time.  Skin: Skin is warm and dry.  Psychiatric: He has a normal mood and affect. His behavior is normal.  Nursing note and vitals reviewed.   Mallampati Score:  1 Imaging: Ct Angio Abd/pel W/ And/or W/o  05/24/2015   CLINICAL DATA:  Status post EVAR to repair abdominal aortic aneurysm and September, 2013. Previous demonstration of persistent type 2 endoleak supplied by the inferior mesenteric artery.  EXAM: CTA ABDOMEN AND PELVIS WITHOUT CONTRAST  TECHNIQUE: Multidetector CT imaging of the abdomen and pelvis was performed using the standard protocol during bolus administration of intravenous contrast. Multiplanar reconstructed images and MIPs were obtained and reviewed to evaluate the vascular anatomy.   CONTRAST:  75 mL Isovue 370 IV  COMPARISON:  03/29/2014, 11/10/2013 and 12/09/2012  FINDINGS: Positioning and patency of the aortic endograft is stable including the iliac limbs with limb extension on the right into the external iliac artery. 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 The excluded thrombosed aneurysm sac measures 2.7 cm in greatest diameter and appears stable.  The aortic aneurysm sac shows slight enlargement since the prior study with maximal dimensions of approximately 4.8 x 6.0 cm (4.5 x 5.6 cm previously). A type 2 endoleak supplied primarily by retrograde flow in the inferior mesenteric artery is again demonstrated on both arterial and venous phase imaging. Configuration of the endoleak is similar to the prior CTA. There is opacification of a left-sided L3 lumbar artery which does show opacification to the posterior aortic sac margin. This lumbar artery may also contribute to the type 2 endoleak.  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 This segment measures 15 mm in greatest diameter. The native external iliac and common femoral arteries are normally patent bilaterally.  Stable and normal patency again demonstrated of the celiac axis, superior mesenteric artery bilateral single renal arteries. Stable cholelithiasis and hepatic steatosis. Stable atrophy of the pancreas. Small hiatal hernia demonstrated. No focal masses, enlarged lymph nodes or hernias are seen. Stable appearance of brachytherapy seeds in the prostate gland. 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  Review of  the MIP images confirms the above findings.  IMPRESSION: Slight enlargement of aortic aneurysm sac size up to 4.8 x 6.0  cm. A type 2 endoleak is again re- demonstrated by CTA primarily supplied by the inferior mesenteric artery. There may also be contribution from a left L3 lumbar artery. 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   Electronically Signed   By: Aletta Edouard M.D.   On: 05/24/2015 13:20    Labs:  CBC:  Recent Labs  01/07/15 1847 01/07/15 2345 01/08/15 0404  WBC 5.4 5.1 4.6  HGB 16.1 15.2 15.1  HCT 43.4 42.0 42.5  PLT 171 165 169    COAGS:  Recent Labs  01/07/15 2345 01/08/15 0404  INR 1.19 1.13  APTT 29  --     BMP:  Recent Labs  01/07/15 1847 01/07/15 2345 01/08/15 0404 05/21/15 0317 05/24/15 0810  NA 137 138 139  --   --   K 3.4* 3.2* 3.1*  --   --   CL 101 104 105  --   --   CO2 25 26 27   --   --   GLUCOSE 130* 130* 124*  --   --   BUN 15 13 11   --  20  CALCIUM 10.0 9.4 9.3  --   --   CREATININE 1.17 1.07 1.10 1.30  --   GFRNONAA 60* 67* 65*  --   --   GFRAA 70* 78* 75*  --   --     LIVER FUNCTION TESTS:  Recent Labs  01/07/15 2345  BILITOT 0.9  AST 40*  ALT 61*  ALKPHOS 80  PROT 6.3  ALBUMIN 3.8    TUMOR MARKERS: No results for input(s): AFPTM, CEA, CA199, CHROMGRNA in the last 8760 hours.  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 His otherwise excluded abdominal aortic aneurysm sac has demonstrated slow interval growth over time indicating pressurization.  Due to the interval sac growth, he is a candidate for endoleak repair. Based on his CTA, there appears to be an endovascular path from the superior to the inferior mesenteric artery.  I will schedule him  for an outpatient arteriogram and attempted catheterization of the aneurysm sac with endovascular repair of the endoleak using liquid (Onyx) and/or coil embolics. Procedure to be performed at [facility].  If this is unsuccessful, I would bring the patient back on another day for a direct second puncture. If a direct second puncture were required, this would require general anesthesia.  I've explained the risks, benefits and alternatives in detail to Garrett Jordan and answered all of his questions. He has an excellent understanding of this disease process and the plan to correct it.  Thank you for this interesting consult.  I greatly enjoyed meeting Garrett WATERMAN Sr. and look forward to participating in their care.  A copy of this report was sent to the requesting provider on this date.  SignedJacqulynn Cadet 06/01/2015, 8:37 AM   I spent a total of  30 Minutes in face to face in clinical consultation, greater than 50% of which was counseling/coordinating care for type II endoleak.

## 2015-06-02 ENCOUNTER — Encounter (HOSPITAL_COMMUNITY)
Admission: RE | Admit: 2015-06-02 | Discharge: 2015-06-02 | Disposition: A | Discharge status: Home / Self Care | Payer: Self-pay | Source: Ambulatory Visit | Attending: Cardiology | Admitting: Cardiology

## 2015-06-04 ENCOUNTER — Encounter (HOSPITAL_COMMUNITY)
Admission: RE | Admit: 2015-06-04 | Discharge: 2015-06-04 | Disposition: A | Discharge status: Home / Self Care | Payer: Self-pay | Source: Ambulatory Visit | Attending: Cardiology | Admitting: Cardiology

## 2015-06-07 ENCOUNTER — Encounter (HOSPITAL_COMMUNITY): Payer: Self-pay

## 2015-06-09 ENCOUNTER — Encounter (HOSPITAL_COMMUNITY): Payer: Self-pay

## 2015-06-11 ENCOUNTER — Encounter (HOSPITAL_COMMUNITY): Payer: Self-pay

## 2015-06-14 ENCOUNTER — Encounter (HOSPITAL_COMMUNITY)
Admission: RE | Admit: 2015-06-14 | Discharge: 2015-06-14 | Disposition: A | Discharge status: Home / Self Care | Payer: Self-pay | Source: Ambulatory Visit | Attending: Cardiology | Admitting: Cardiology

## 2015-06-14 DIAGNOSIS — I209 Angina pectoris, unspecified: Secondary | ICD-10-CM | POA: Insufficient documentation

## 2015-06-16 ENCOUNTER — Encounter (HOSPITAL_COMMUNITY): Payer: Self-pay

## 2015-06-18 ENCOUNTER — Encounter (HOSPITAL_COMMUNITY)
Admission: RE | Admit: 2015-06-18 | Discharge: 2015-06-18 | Disposition: A | Discharge status: Home / Self Care | Payer: Self-pay | Source: Ambulatory Visit | Attending: Cardiology | Admitting: Cardiology

## 2015-06-21 ENCOUNTER — Encounter (HOSPITAL_COMMUNITY)
Admission: RE | Admit: 2015-06-21 | Discharge: 2015-06-21 | Disposition: A | Discharge status: Home / Self Care | Payer: Self-pay | Source: Ambulatory Visit | Attending: Cardiology | Admitting: Cardiology

## 2015-06-23 ENCOUNTER — Encounter (HOSPITAL_COMMUNITY)
Admission: RE | Admit: 2015-06-23 | Discharge: 2015-06-23 | Disposition: A | Discharge status: Home / Self Care | Payer: Self-pay | Source: Ambulatory Visit | Attending: Cardiology | Admitting: Cardiology

## 2015-06-25 ENCOUNTER — Encounter (HOSPITAL_COMMUNITY)
Admission: RE | Admit: 2015-06-25 | Discharge: 2015-06-25 | Disposition: A | Discharge status: Home / Self Care | Payer: Self-pay | Source: Ambulatory Visit | Attending: Cardiology | Admitting: Cardiology

## 2015-06-28 ENCOUNTER — Encounter (HOSPITAL_COMMUNITY)
Admission: RE | Admit: 2015-06-28 | Discharge: 2015-06-28 | Disposition: A | Discharge status: Home / Self Care | Payer: Self-pay | Source: Ambulatory Visit | Attending: Cardiology | Admitting: Cardiology

## 2015-06-30 ENCOUNTER — Encounter (HOSPITAL_COMMUNITY): Payer: Self-pay

## 2015-07-02 ENCOUNTER — Telehealth (HOSPITAL_COMMUNITY): Payer: Self-pay | Admitting: Interventional Radiology

## 2015-07-02 ENCOUNTER — Other Ambulatory Visit (HOSPITAL_COMMUNITY): Payer: Self-pay | Admitting: Interventional Radiology

## 2015-07-02 ENCOUNTER — Encounter (HOSPITAL_COMMUNITY): Payer: Self-pay

## 2015-07-02 DIAGNOSIS — I714 Abdominal aortic aneurysm, without rupture, unspecified: Secondary | ICD-10-CM

## 2015-07-02 DIAGNOSIS — T82330A Leakage of aortic (bifurcation) graft (replacement), initial encounter: Secondary | ICD-10-CM

## 2015-07-02 DIAGNOSIS — IMO0001 Reserved for inherently not codable concepts without codable children: Secondary | ICD-10-CM

## 2015-07-02 NOTE — Telephone Encounter (Signed)
Called pt on both home and cell phone numbers, no answer on either. Left VM on both for him to call to schedule his procedure with Laurence Ferrari. JM

## 2015-07-05 ENCOUNTER — Encounter (HOSPITAL_COMMUNITY)
Admission: RE | Admit: 2015-07-05 | Discharge: 2015-07-05 | Disposition: A | Discharge status: Home / Self Care | Payer: Self-pay | Source: Ambulatory Visit | Attending: Cardiology | Admitting: Cardiology

## 2015-07-07 ENCOUNTER — Encounter (HOSPITAL_COMMUNITY): Payer: Self-pay

## 2015-07-09 ENCOUNTER — Encounter (HOSPITAL_COMMUNITY)
Admission: RE | Admit: 2015-07-09 | Discharge: 2015-07-09 | Disposition: A | Discharge status: Home / Self Care | Payer: Self-pay | Source: Ambulatory Visit | Attending: Cardiology | Admitting: Cardiology

## 2015-07-12 ENCOUNTER — Encounter (HOSPITAL_COMMUNITY)
Admission: RE | Admit: 2015-07-12 | Discharge: 2015-07-12 | Disposition: A | Discharge status: Home / Self Care | Payer: Self-pay | Source: Ambulatory Visit | Attending: Cardiology | Admitting: Cardiology

## 2015-07-14 ENCOUNTER — Encounter (HOSPITAL_COMMUNITY)
Admission: RE | Admit: 2015-07-14 | Discharge: 2015-07-14 | Disposition: A | Discharge status: Home / Self Care | Payer: Self-pay | Source: Ambulatory Visit | Attending: Cardiology | Admitting: Cardiology

## 2015-07-15 ENCOUNTER — Other Ambulatory Visit: Payer: Self-pay | Admitting: Radiology

## 2015-07-16 ENCOUNTER — Encounter (HOSPITAL_COMMUNITY): Payer: Medicare Other

## 2015-07-16 ENCOUNTER — Observation Stay (HOSPITAL_COMMUNITY)
Admission: RE | Admit: 2015-07-16 | Discharge: 2015-07-16 | Disposition: A | Discharge status: Home / Self Care | Payer: Medicare Other | Source: Ambulatory Visit | Attending: Interventional Radiology | Admitting: Interventional Radiology

## 2015-07-16 ENCOUNTER — Encounter (HOSPITAL_COMMUNITY): Payer: Self-pay

## 2015-07-16 VITALS — BP 134/72 | HR 64 | Temp 97.8°F | Resp 18 | Ht 70.0 in | Wt 206.0 lb

## 2015-07-16 DIAGNOSIS — E785 Hyperlipidemia, unspecified: Secondary | ICD-10-CM | POA: Diagnosis not present

## 2015-07-16 DIAGNOSIS — E039 Hypothyroidism, unspecified: Secondary | ICD-10-CM | POA: Insufficient documentation

## 2015-07-16 DIAGNOSIS — Z955 Presence of coronary angioplasty implant and graft: Secondary | ICD-10-CM | POA: Insufficient documentation

## 2015-07-16 DIAGNOSIS — Z9862 Peripheral vascular angioplasty status: Secondary | ICD-10-CM

## 2015-07-16 DIAGNOSIS — I2510 Atherosclerotic heart disease of native coronary artery without angina pectoris: Secondary | ICD-10-CM | POA: Diagnosis not present

## 2015-07-16 DIAGNOSIS — I209 Angina pectoris, unspecified: Secondary | ICD-10-CM | POA: Insufficient documentation

## 2015-07-16 DIAGNOSIS — T82330A Leakage of aortic (bifurcation) graft (replacement), initial encounter: Principal | ICD-10-CM | POA: Insufficient documentation

## 2015-07-16 DIAGNOSIS — E119 Type 2 diabetes mellitus without complications: Secondary | ICD-10-CM | POA: Insufficient documentation

## 2015-07-16 DIAGNOSIS — I10 Essential (primary) hypertension: Secondary | ICD-10-CM | POA: Insufficient documentation

## 2015-07-16 DIAGNOSIS — I714 Abdominal aortic aneurysm, without rupture, unspecified: Secondary | ICD-10-CM | POA: Insufficient documentation

## 2015-07-16 DIAGNOSIS — IMO0002 Reserved for concepts with insufficient information to code with codable children: Secondary | ICD-10-CM | POA: Diagnosis present

## 2015-07-16 DIAGNOSIS — Y832 Surgical operation with anastomosis, bypass or graft as the cause of abnormal reaction of the patient, or of later complication, without mention of misadventure at the time of the procedure: Secondary | ICD-10-CM | POA: Diagnosis not present

## 2015-07-16 DIAGNOSIS — I252 Old myocardial infarction: Secondary | ICD-10-CM | POA: Insufficient documentation

## 2015-07-16 DIAGNOSIS — Z7982 Long term (current) use of aspirin: Secondary | ICD-10-CM | POA: Insufficient documentation

## 2015-07-16 DIAGNOSIS — Z87891 Personal history of nicotine dependence: Secondary | ICD-10-CM | POA: Diagnosis not present

## 2015-07-16 DIAGNOSIS — IMO0001 Reserved for inherently not codable concepts without codable children: Secondary | ICD-10-CM

## 2015-07-16 LAB — CBC WITH DIFFERENTIAL/PLATELET
BASOS PCT: 0 % (ref 0–1)
Basophils Absolute: 0 10*3/uL (ref 0.0–0.1)
EOS PCT: 3 % (ref 0–5)
Eosinophils Absolute: 0.1 10*3/uL (ref 0.0–0.7)
HEMATOCRIT: 42.2 % (ref 39.0–52.0)
Hemoglobin: 15 g/dL (ref 13.0–17.0)
Lymphocytes Relative: 21 % (ref 12–46)
Lymphs Abs: 1 10*3/uL (ref 0.7–4.0)
MCH: 31.1 pg (ref 26.0–34.0)
MCHC: 35.5 g/dL (ref 30.0–36.0)
MCV: 87.4 fL (ref 78.0–100.0)
MONO ABS: 0.7 10*3/uL (ref 0.1–1.0)
MONOS PCT: 14 % — AB (ref 3–12)
NEUTROS ABS: 3.1 10*3/uL (ref 1.7–7.7)
Neutrophils Relative %: 62 % (ref 43–77)
PLATELETS: 173 10*3/uL (ref 150–400)
RBC: 4.83 MIL/uL (ref 4.22–5.81)
RDW: 12.9 % (ref 11.5–15.5)
WBC: 5 10*3/uL (ref 4.0–10.5)

## 2015-07-16 LAB — BASIC METABOLIC PANEL
ANION GAP: 11 (ref 5–15)
BUN: 18 mg/dL (ref 6–20)
CALCIUM: 9.6 mg/dL (ref 8.9–10.3)
CO2: 22 mmol/L (ref 22–32)
Chloride: 105 mmol/L (ref 101–111)
Creatinine, Ser: 1.23 mg/dL (ref 0.61–1.24)
GFR calc Af Amer: >60 mL/min (ref >60)
GFR, EST NON AFRICAN AMERICAN: 57 mL/min — AB (ref >60)
GLUCOSE: 152 mg/dL — AB (ref 65–99)
Potassium: 3.4 mmol/L — ABNORMAL LOW (ref 3.5–5.1)
Sodium: 138 mmol/L (ref 135–145)

## 2015-07-16 LAB — PROTIME-INR
INR: 1.18 (ref 0.00–1.49)
Prothrombin Time: 15.2 seconds (ref 11.6–15.2)

## 2015-07-16 MED ORDER — MIDAZOLAM HCL 2 MG/2ML IJ SOLN
INTRAMUSCULAR | Status: AC
  Filled 2015-07-16: qty 2

## 2015-07-16 MED ORDER — IOHEXOL 300 MG/ML  SOLN: 150.0000 mL | Freq: Once | INTRAMUSCULAR | Status: DC | PRN

## 2015-07-16 MED ORDER — MIDAZOLAM HCL 2 MG/2ML IJ SOLN
INTRAMUSCULAR | Status: AC
  Filled 2015-07-16: qty 4

## 2015-07-16 MED ORDER — SODIUM CHLORIDE 0.9 % IV SOLN: INTRAVENOUS | Status: DC

## 2015-07-16 MED ORDER — MIDAZOLAM HCL 2 MG/2ML IJ SOLN
INTRAMUSCULAR | Status: AC | PRN
  Administered 2015-07-16 (×5): 1 mg via INTRAVENOUS

## 2015-07-16 MED ORDER — FENTANYL CITRATE (PF) 100 MCG/2ML IJ SOLN
INTRAMUSCULAR | Status: AC | PRN
  Administered 2015-07-16 (×4): 50 ug via INTRAVENOUS

## 2015-07-16 MED ORDER — CEFAZOLIN SODIUM-DEXTROSE 2-3 GM-% IV SOLR
INTRAVENOUS | Status: AC
  Administered 2015-07-16: 2 g via INTRAVENOUS
  Filled 2015-07-16: qty 50

## 2015-07-16 MED ORDER — HYDROCODONE-ACETAMINOPHEN 5-325 MG PO TABS: 1.0000 | ORAL_TABLET | ORAL | Status: DC | PRN

## 2015-07-16 MED ORDER — FENTANYL CITRATE (PF) 100 MCG/2ML IJ SOLN
INTRAMUSCULAR | Status: AC
  Filled 2015-07-16: qty 4

## 2015-07-16 MED ORDER — CEFAZOLIN SODIUM-DEXTROSE 2-3 GM-% IV SOLR
2.0000 g | Freq: Once | INTRAVENOUS | Status: AC
  Administered 2015-07-16: 2 g via INTRAVENOUS

## 2015-07-16 MED ORDER — SODIUM CHLORIDE 0.9 % IV SOLN
INTRAVENOUS | Status: DC
  Administered 2015-07-16: 1000 mL via INTRAVENOUS

## 2015-07-16 MED ORDER — LIDOCAINE HCL 1 % IJ SOLN
INTRAMUSCULAR | Status: AC
  Filled 2015-07-16: qty 20

## 2015-07-16 NOTE — H&P (Signed)
Chief Complaint: Patient was seen in consultation today for Type II Endoleak at the request of Dr Trula Slade  Referring Physician(s): Harold Barban   History of Present Illness: Garrett Jordan. is a 74 y.o. male   Garrett Jordan. is a 74 y.o. male with a history of abdominal aortic aneurysm status post endovascular aortic repair with bifurcated stent graft in September 2013. 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 His aneurysm sac has demonstrated interval growth most recently measuring 4.8 x 6.0 cm compared to 4.5 x 5.67 m on the prior study.  Pt asymptomatic; denies abd pain; change in BP Referred to Dr Laurence Ferrari and consulted regarding endoleak repair 06/01/15 Now scheduled for Image guided aortagram with endovascular endoleak repair  Past Medical History  Diagnosis Date  . AAA (abdominal aortic aneurysm)   . CAD (coronary artery disease)   . Hypertension   . Hyperlipidemia   . Thyroid disease     hypothyroidism  . Reflux   . Gout   . Nerve compression     right leg  . Hypothyroidism   . Seasonal allergies   . GERD (gastroesophageal reflux disease)   . Myocardial infarction 01/1991    sp inferior  . Atrial fibrillation   . Kidney stone     right kidney  . Cancer     prostate  . Arthritis     knees  . 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 Parathyroid adenoma    . Spine surgery    . Kidney stone surgery    . Tonsillectomy    . Prostate surgery      rad seeds  . Coronary stent placement  1992 and  2009    RCA  . Abdominal aortagram embolization  02/28/2012  . Coronary artery bypass graft  04/2009  . Abdominal aortic aneurysm repair  Sept. 2013  . Abdominal aortagram N/A 02/28/2012    Procedure: ABDOMINAL Maxcine Ham;  Surgeon: Serafina Mitchell, MD;  Location: [facility] CATH LAB;  Service: Cardiovascular;   Laterality: N/A;  . Embolization Right 02/28/2012    Procedure: EMBOLIZATION;  Surgeon: Serafina Mitchell, MD;  Location: [facility] CATH LAB;  Service: Cardiovascular;  Laterality: Right;  . Left heart catheterization with coronary/graft angiogram N/A 01/08/2015    Procedure: LEFT HEART CATHETERIZATION WITH Beatrix Fetters;  Surgeon: Jacolyn Reedy, MD;  Location: [facility] CATH LAB;  Service: Cardiovascular;  Laterality: N/A;    Allergies: Ace inhibitors; Crestor; and Nitroglycerin  Medications: Prior to Admission medications   Medication Sig Start Date End Date Taking? Authorizing Provider  acetaminophen (TYLENOL) 650 MG CR tablet Take 650 mg by mouth daily as needed for pain.    Yes Historical Provider, MD  allopurinol (ZYLOPRIM) 300 MG tablet Take 300 mg by mouth every other day. Take in the evening - on even days of the month   Yes Historical Provider, MD  amLODipine (NORVASC) 10 MG tablet Take 10 mg by mouth daily.   Yes Historical Provider, MD  aspirin EC 81 MG tablet Take 81 mg by mouth at bedtime.    Yes Historical Provider, MD  Cholecalciferol (VITAMIN D3) 2000 UNITS TABS Take 2,000 Units by mouth daily.    Yes Historical Provider, MD  clonazePAM (KLONOPIN) 1 MG tablet Take 0.5 mg by mouth at bedtime.  Yes Historical Provider, MD  clopidogrel (PLAVIX) 75 MG tablet Take 75 mg by mouth daily. 03/09/15  Yes Historical Provider, MD  diltiazem (CARDIZEM CD) 240 MG 24 hr capsule Take 240 mg by mouth at bedtime.    Yes Historical Provider, MD  esomeprazole (NEXIUM) 20 MG capsule Take 20 mg by mouth daily at 12 noon.   Yes Historical Provider, MD  fluticasone (FLONASE) 50 MCG/ACT nasal spray Place 2 sprays into both nostrils daily as needed for allergies.    Yes Historical Provider, MD  levothyroxine (SYNTHROID, LEVOTHROID) 137 MCG tablet Take 137 mcg by mouth daily before breakfast.   Yes Historical Provider, MD  loratadine-pseudoephedrine (CLARITIN-D 24-HOUR) 10-240 MG per 24 hr tablet Take 1  tablet by mouth at bedtime.    Yes Historical Provider, MD  metoprolol succinate (TOPROL-XL) 100 MG 24 hr tablet Take 1 tablet (100 mg total) by mouth daily. Take with or immediately following a meal. 01/08/15  Yes Jacolyn Reedy, MD  Multiple Vitamin (MULTIVITAMIN WITH MINERALS) TABS Take 1 tablet by mouth at bedtime.    Yes Historical Provider, MD  Multiple Vitamins-Minerals (PRESERVISION AREDS 2) CAPS Take 1 capsule by mouth 2 (two) times daily.   Yes Historical Provider, MD  ofloxacin (OCUFLOX) 0.3 % ophthalmic solution Place 1 drop into the left eye 2 (two) times daily. 06/16/15  Yes Historical Provider, MD  olmesartan-hydrochlorothiazide (BENICAR HCT) 40-25 MG per tablet Take 1 tablet by mouth daily.    Yes Historical Provider, MD  Omega-3 Fatty Acids (FISH OIL) 1200 MG CAPS Take 1,200 mg by mouth 2 (two) times daily.   Yes Historical Provider, MD  potassium chloride SA (K-DUR,KLOR-CON) 20 MEQ tablet Take 1 tablet (20 mEq total) by mouth daily. 01/08/15  Yes Jacolyn Reedy, MD  PROLENSA 0.07 % SOLN Place 1 drop into the left eye daily. 06/16/15  Yes Historical Provider, MD  rosuvastatin (CRESTOR) 10 MG tablet Take 10 mg by mouth as directed. Per patient, takes Mondays & Thursdays   Yes Historical Provider, MD  Tamsulosin HCl (FLOMAX) 0.4 MG CAPS Take 0.4 mg by mouth every other day. Take in the evening - on even days of the month   Yes Historical Provider, MD  vitamin B-12 (CYANOCOBALAMIN) 1000 MCG tablet Take 1,000 mcg by mouth at bedtime.    Yes Historical Provider, MD  amLODipine (NORVASC) 5 MG tablet Take 10 mg by mouth daily.     Historical Provider, MD  esomeprazole (NEXIUM) 20 MG packet Take 20 mg by mouth at bedtime. Patient not taking: Reported on 07/14/2015 01/08/15   Jacolyn Reedy, MD  famotidine (PEPCID) 20 MG tablet Take 20 mg by mouth at bedtime.     Historical Provider, MD  Glucose Blood (FREESTYLE TEST VI)  04/03/13   Historical Provider, MD     Family History  Problem  Relation Age of Onset  . Heart disease Father     Heart Disease before age 44  . Heart attack Father   . Hyperlipidemia Father   . Hypertension Father   . Stroke Mother   . Deep vein thrombosis Mother   . Heart disease Sister   . Diabetes Sister   . Hyperlipidemia Sister   . Hypertension Sister   . Heart attack Sister   . Heart disease Paternal Uncle   . Diabetes Maternal Grandmother   . Diabetes Sister     Social History   Social History  . Marital Status: Divorced    Spouse Name: N/A  .  Number of Children: N/A  . Years of Education: N/A   Social History Main Topics  . Smoking status: Former Smoker    Types: Cigarettes    Quit date: 08/01/1970  . Smokeless tobacco: Never Used  . Alcohol Use: 3.6 - 5.4 oz/week    4-5 Glasses of wine, 2-4 Cans of beer per week     Comment: 1-3 glasses 3-4 times a week  . Drug Use: No  . Sexual Activity: Not Asked   Other Topics Concern  . None   Social History Narrative     Review of Systems: A 12 point ROS discussed and pertinent positives are indicated in the HPI above.  All other systems are negative.  Review of Systems   Constitutional: Negative for fever and fatigue.  Respiratory: Negative for cough, chest tightness and shortness of breath.   Cardiovascular: Negative for chest pain.  Gastrointestinal: Negative for nausea and abdominal pain.  Musculoskeletal: Negative for back pain.  Neurological: Negative for weakness.  Psychiatric/Behavioral: Negative for behavioral problems and confusion.     Vital Signs: BP 115/70 mmHg  Pulse 59  Temp(Src) 97.3 F (36.3 C)  Resp 20  Ht 5\' 10"  (1.778 m)  Wt 206 lb (93.441 kg)  BMI 29.56 kg/m2  SpO2 95%  Physical Exam  Constitutional: He is oriented to person, place, and time. He appears well-developed and well-nourished.  Cardiovascular: Normal rate, regular rhythm and normal heart sounds.   No murmur heard. Pulmonary/Chest: Effort normal and breath sounds normal. He has no  wheezes.  Abdominal: Soft. Bowel sounds are normal. There is no tenderness.  Musculoskeletal: Normal range of motion.  Neurological: He is alert and oriented to person, place, and time.  Skin: Skin is warm and dry.  Psychiatric: He has a normal mood and affect. His behavior is normal. Judgment and thought content normal.  Nursing note and vitals reviewed.   Mallampati Score:  MD Evaluation Airway: WNL Heart: WNL Abdomen: WNL Chest/ Lungs: WNL ASA  Classification: 2 Mallampati/Airway Score: Two  Imaging: No results found.  Labs:  CBC:  Recent Labs  01/07/15 1847 01/07/15 2345 01/08/15 0404 07/16/15 0652  WBC 5.4 5.1 4.6 5.0  HGB 16.1 15.2 15.1 15.0  HCT 43.4 42.0 42.5 42.2  PLT 171 165 169 173    COAGS:  Recent Labs  01/07/15 2345 01/08/15 0404 07/16/15 0652  INR 1.19 1.13 1.18  APTT 29  --   --     BMP:  Recent Labs  01/07/15 1847 01/07/15 2345 01/08/15 0404 05/21/15 0317 05/24/15 0810  NA 137 138 139  --   --   K 3.4* 3.2* 3.1*  --   --   CL 101 104 105  --   --   CO2 25 26 27   --   --   GLUCOSE 130* 130* 124*  --   --   BUN 15 13 11   --  20  CALCIUM 10.0 9.4 9.3  --   --   CREATININE 1.17 1.07 1.10 1.30  --   GFRNONAA 60* 67* 65*  --   --   GFRAA 70* 78* 75*  --   --     LIVER FUNCTION TESTS:  Recent Labs  01/07/15 2345  BILITOT 0.9  AST 40*  ALT 61*  ALKPHOS 80  PROT 6.3  ALBUMIN 3.8    TUMOR MARKERS: No results for input(s): AFPTM, CEA, CA199, CHROMGRNA in the last 8760 hours.  Assessment and Plan:  AAA surgical repair  07/2012 Interval endoleak noted on serial CTA Slight enlargement persists Referred to and consulted with Dr Laurence Ferrari for discussion and possible treatment of endovascular endoleak repair Now scheduled for same   Thank you for this interesting consult.  I greatly enjoyed meeting Garrett KOPPER Sr. and look forward to participating in their care.  A copy of this report was sent to the requesting provider  on this date.  Signed: Avamae Dehaan A 07/16/2015, 7:33 AM   I spent a total of  30 Minutes   in face to face in clinical consultation, greater than 50% of which was counseling/coordinating care for Type ii Endoleak repair

## 2015-07-16 NOTE — Sedation Documentation (Signed)
6 [place] exoseal intact R groin. IR tech holding pressure

## 2015-07-16 NOTE — Procedures (Signed)
Interventional Radiology Procedure Note  Procedure:  Endovascular repair of endoleak (type 2).    Access: Right common femoral, 12f.  Cordis ExoSeal closure  Complications: None  Estimated Blood Loss: 0  Recommendations:  - Bedrest x 4hrs - Anticipate DC later today  Signed,  Criselda Peaches, MD

## 2015-07-16 NOTE — Sedation Documentation (Signed)
Pre- procedure pulses bilaterally: DP 3+, PT 3+

## 2015-07-16 NOTE — Discharge Planning (Signed)
At [place] foley dc'd and pt stood at bedside without any Rt groin bleeding. Once pt urinates will dc home.

## 2015-07-16 NOTE — Discharge Planning (Signed)
Copy of AVS reviewed and given to patient who verbalizes understanding. Foley was dc'd and pt voided. Up in room no difficulty with right groin site. Dc'd per w/c to private car home with all personal belongings accompanied by family.

## 2015-07-16 NOTE — Discharge Instructions (Signed)
Light activity only through Labor day.  Do not lift anything heavier than a jug of milk.  May resume full activity on Tuesday 07/20/15.   Signed,  Criselda Peaches, MD  Pager: [phone] Clinic: [phone]

## 2015-07-20 ENCOUNTER — Telehealth (HOSPITAL_COMMUNITY): Payer: Self-pay | Admitting: Cardiac Rehabilitation

## 2015-07-20 NOTE — Telephone Encounter (Signed)
-----   Message from Jacqulynn Cadet, MD sent at 07/18/2015  8:08 AM EDT ----- Regarding: RE: cardiac rehab [name],  Garrett Jordan can return to full activity on Tuesday 9/6.  Do please try to keep his blood pressure below 160/90.   Thank you,  Kalman Drape, MD  Pager: [phone] Clinic: [phone]    ----- Message -----    From: Lowell Guitar, RN    Sent: 07/17/2015   3:54 PM      To: Jacqulynn Cadet, MD Subject: cardiac rehab                                  Dear Dr. Laurence Ferrari,  Pt had exoseal closure of endoleak of aortic graft on 07/16/15.  When is it appropriate for him to return to cardiac rehab exercise program?  What are BP parameters and does he have any activity restrictions?  Thank you,  Andi Hence, RN, BSN Cardiac Pulmonary Rehab

## 2015-07-21 ENCOUNTER — Encounter (HOSPITAL_COMMUNITY): Payer: Self-pay

## 2015-07-23 ENCOUNTER — Encounter (HOSPITAL_COMMUNITY)
Admission: RE | Admit: 2015-07-23 | Discharge: 2015-07-23 | Disposition: A | Discharge status: Home / Self Care | Payer: Self-pay | Source: Ambulatory Visit | Attending: Cardiology | Admitting: Cardiology

## 2015-07-26 ENCOUNTER — Encounter (HOSPITAL_COMMUNITY)
Admission: RE | Admit: 2015-07-26 | Discharge: 2015-07-26 | Disposition: A | Discharge status: Home / Self Care | Payer: Self-pay | Source: Ambulatory Visit | Attending: Cardiology | Admitting: Cardiology

## 2015-07-28 ENCOUNTER — Encounter (HOSPITAL_COMMUNITY)
Admission: RE | Admit: 2015-07-28 | Discharge: 2015-07-28 | Disposition: A | Discharge status: Home / Self Care | Payer: Self-pay | Source: Ambulatory Visit | Attending: Cardiology | Admitting: Cardiology

## 2015-07-30 ENCOUNTER — Encounter (HOSPITAL_COMMUNITY)
Admission: RE | Admit: 2015-07-30 | Discharge: 2015-07-30 | Disposition: A | Discharge status: Home / Self Care | Payer: Self-pay | Source: Ambulatory Visit | Attending: Cardiology | Admitting: Cardiology

## 2015-08-02 ENCOUNTER — Encounter (HOSPITAL_COMMUNITY): Payer: Self-pay

## 2015-08-04 ENCOUNTER — Encounter (HOSPITAL_COMMUNITY)
Admission: RE | Admit: 2015-08-04 | Discharge: 2015-08-04 | Disposition: A | Discharge status: Home / Self Care | Payer: Self-pay | Source: Ambulatory Visit | Attending: Cardiology | Admitting: Cardiology

## 2015-08-06 ENCOUNTER — Encounter (HOSPITAL_COMMUNITY)
Admission: RE | Admit: 2015-08-06 | Discharge: 2015-08-06 | Disposition: A | Discharge status: Home / Self Care | Payer: Self-pay | Source: Ambulatory Visit | Attending: Cardiology | Admitting: Cardiology

## 2015-08-09 ENCOUNTER — Encounter (HOSPITAL_COMMUNITY)
Admission: RE | Admit: 2015-08-09 | Discharge: 2015-08-09 | Disposition: A | Discharge status: Home / Self Care | Payer: Self-pay | Source: Ambulatory Visit | Attending: Cardiology | Admitting: Cardiology

## 2015-08-11 ENCOUNTER — Encounter (HOSPITAL_COMMUNITY): Payer: Self-pay

## 2015-08-13 ENCOUNTER — Encounter (HOSPITAL_COMMUNITY)
Admission: RE | Admit: 2015-08-13 | Discharge: 2015-08-13 | Disposition: A | Discharge status: Home / Self Care | Payer: Self-pay | Source: Ambulatory Visit | Attending: Cardiology | Admitting: Cardiology

## 2015-08-16 ENCOUNTER — Encounter (HOSPITAL_COMMUNITY)
Admission: RE | Admit: 2015-08-16 | Discharge: 2015-08-16 | Disposition: A | Discharge status: Home / Self Care | Payer: Self-pay | Source: Ambulatory Visit | Attending: Cardiology | Admitting: Cardiology

## 2015-08-16 DIAGNOSIS — I209 Angina pectoris, unspecified: Secondary | ICD-10-CM | POA: Insufficient documentation

## 2015-08-18 ENCOUNTER — Encounter (HOSPITAL_COMMUNITY)
Admission: RE | Admit: 2015-08-18 | Discharge: 2015-08-18 | Disposition: A | Discharge status: Home / Self Care | Payer: Self-pay | Source: Ambulatory Visit | Attending: Cardiology | Admitting: Cardiology

## 2015-08-20 ENCOUNTER — Encounter (HOSPITAL_COMMUNITY)
Admission: RE | Admit: 2015-08-20 | Discharge: 2015-08-20 | Disposition: A | Discharge status: Home / Self Care | Payer: Self-pay | Source: Ambulatory Visit | Attending: Cardiology | Admitting: Cardiology

## 2015-08-23 ENCOUNTER — Encounter (HOSPITAL_COMMUNITY)
Admission: RE | Admit: 2015-08-23 | Discharge: 2015-08-23 | Disposition: A | Discharge status: Home / Self Care | Payer: Self-pay | Source: Ambulatory Visit | Attending: Cardiology | Admitting: Cardiology

## 2015-08-25 ENCOUNTER — Encounter (HOSPITAL_COMMUNITY)
Admission: RE | Admit: 2015-08-25 | Discharge: 2015-08-25 | Disposition: A | Discharge status: Home / Self Care | Payer: Self-pay | Source: Ambulatory Visit | Attending: Cardiology | Admitting: Cardiology

## 2015-08-27 ENCOUNTER — Encounter (HOSPITAL_COMMUNITY)
Admission: RE | Admit: 2015-08-27 | Discharge: 2015-08-27 | Disposition: A | Discharge status: Home / Self Care | Payer: Self-pay | Source: Ambulatory Visit | Attending: Cardiology | Admitting: Cardiology

## 2015-08-30 ENCOUNTER — Encounter (HOSPITAL_COMMUNITY)
Admission: RE | Admit: 2015-08-30 | Discharge: 2015-08-30 | Disposition: A | Discharge status: Home / Self Care | Payer: Self-pay | Source: Ambulatory Visit | Attending: Cardiology | Admitting: Cardiology

## 2015-09-01 ENCOUNTER — Encounter (HOSPITAL_COMMUNITY)
Admission: RE | Admit: 2015-09-01 | Discharge: 2015-09-01 | Disposition: A | Discharge status: Home / Self Care | Payer: Self-pay | Source: Ambulatory Visit | Attending: Cardiology | Admitting: Cardiology

## 2015-09-03 ENCOUNTER — Encounter (HOSPITAL_COMMUNITY): Payer: Self-pay

## 2015-09-06 ENCOUNTER — Encounter (HOSPITAL_COMMUNITY)
Admission: RE | Admit: 2015-09-06 | Discharge: 2015-09-06 | Disposition: A | Discharge status: Home / Self Care | Payer: Self-pay | Source: Ambulatory Visit | Attending: Cardiology | Admitting: Cardiology

## 2015-09-08 ENCOUNTER — Encounter (HOSPITAL_COMMUNITY)
Admission: RE | Admit: 2015-09-08 | Discharge: 2015-09-08 | Disposition: A | Discharge status: Home / Self Care | Payer: Self-pay | Source: Ambulatory Visit | Attending: Cardiology | Admitting: Cardiology

## 2015-09-10 ENCOUNTER — Encounter (HOSPITAL_COMMUNITY): Payer: Self-pay

## 2015-09-13 ENCOUNTER — Encounter (HOSPITAL_COMMUNITY): Payer: Self-pay

## 2015-09-15 ENCOUNTER — Encounter (HOSPITAL_COMMUNITY): Payer: Medicare Other

## 2015-09-15 DIAGNOSIS — I209 Angina pectoris, unspecified: Secondary | ICD-10-CM | POA: Insufficient documentation

## 2015-09-17 ENCOUNTER — Encounter (HOSPITAL_COMMUNITY): Payer: Self-pay

## 2015-09-20 ENCOUNTER — Encounter (HOSPITAL_COMMUNITY)
Admission: RE | Admit: 2015-09-20 | Discharge: 2015-09-20 | Disposition: A | Discharge status: Home / Self Care | Payer: Self-pay | Source: Ambulatory Visit | Attending: Cardiology | Admitting: Cardiology

## 2015-09-22 ENCOUNTER — Encounter (HOSPITAL_COMMUNITY)
Admission: RE | Admit: 2015-09-22 | Discharge: 2015-09-22 | Disposition: A | Discharge status: Home / Self Care | Payer: Self-pay | Source: Ambulatory Visit | Attending: Cardiology | Admitting: Cardiology

## 2015-09-24 ENCOUNTER — Encounter (HOSPITAL_COMMUNITY)
Admission: RE | Admit: 2015-09-24 | Discharge: 2015-09-24 | Disposition: A | Discharge status: Home / Self Care | Payer: Self-pay | Source: Ambulatory Visit | Attending: Cardiology | Admitting: Cardiology

## 2015-09-27 ENCOUNTER — Encounter (HOSPITAL_COMMUNITY)
Admission: RE | Admit: 2015-09-27 | Discharge: 2015-09-27 | Disposition: A | Discharge status: Home / Self Care | Payer: Self-pay | Source: Ambulatory Visit | Attending: Cardiology | Admitting: Cardiology

## 2015-09-29 ENCOUNTER — Encounter (HOSPITAL_COMMUNITY)
Admission: RE | Admit: 2015-09-29 | Discharge: 2015-09-29 | Disposition: A | Discharge status: Home / Self Care | Payer: Self-pay | Source: Ambulatory Visit | Attending: Cardiology | Admitting: Cardiology

## 2015-10-01 ENCOUNTER — Encounter (HOSPITAL_COMMUNITY)
Admission: RE | Admit: 2015-10-01 | Discharge: 2015-10-01 | Disposition: A | Discharge status: Home / Self Care | Payer: Self-pay | Source: Ambulatory Visit | Attending: Cardiology | Admitting: Cardiology

## 2015-10-04 ENCOUNTER — Other Ambulatory Visit (HOSPITAL_COMMUNITY): Payer: Self-pay | Admitting: Interventional Radiology

## 2015-10-04 ENCOUNTER — Other Ambulatory Visit: Payer: Self-pay | Admitting: Radiology

## 2015-10-04 ENCOUNTER — Encounter (HOSPITAL_COMMUNITY)
Admission: RE | Admit: 2015-10-04 | Discharge: 2015-10-04 | Disposition: A | Discharge status: Home / Self Care | Payer: Self-pay | Source: Ambulatory Visit | Attending: Cardiology | Admitting: Cardiology

## 2015-10-04 DIAGNOSIS — IMO0001 Reserved for inherently not codable concepts without codable children: Secondary | ICD-10-CM

## 2015-10-04 DIAGNOSIS — T82330D Leakage of aortic (bifurcation) graft (replacement), subsequent encounter: Secondary | ICD-10-CM

## 2015-10-06 ENCOUNTER — Encounter (HOSPITAL_COMMUNITY): Payer: Self-pay

## 2015-10-11 ENCOUNTER — Encounter (HOSPITAL_COMMUNITY)
Admission: RE | Admit: 2015-10-11 | Discharge: 2015-10-11 | Disposition: A | Discharge status: Home / Self Care | Payer: Self-pay | Source: Ambulatory Visit | Attending: Cardiology | Admitting: Cardiology

## 2015-10-13 ENCOUNTER — Encounter (HOSPITAL_COMMUNITY)
Admission: RE | Admit: 2015-10-13 | Discharge: 2015-10-13 | Disposition: A | Discharge status: Home / Self Care | Payer: Self-pay | Source: Ambulatory Visit | Attending: Cardiology | Admitting: Cardiology

## 2015-10-15 ENCOUNTER — Encounter (HOSPITAL_COMMUNITY): Payer: Self-pay

## 2015-10-15 DIAGNOSIS — I209 Angina pectoris, unspecified: Secondary | ICD-10-CM | POA: Insufficient documentation

## 2015-10-18 ENCOUNTER — Encounter (HOSPITAL_COMMUNITY)
Admission: RE | Admit: 2015-10-18 | Discharge: 2015-10-18 | Disposition: A | Discharge status: Home / Self Care | Payer: Self-pay | Source: Ambulatory Visit | Attending: Cardiology | Admitting: Cardiology

## 2015-10-20 ENCOUNTER — Encounter (HOSPITAL_COMMUNITY)
Admission: RE | Admit: 2015-10-20 | Discharge: 2015-10-20 | Disposition: A | Discharge status: Home / Self Care | Payer: Self-pay | Source: Ambulatory Visit | Attending: Cardiology | Admitting: Cardiology

## 2015-10-22 ENCOUNTER — Encounter (HOSPITAL_COMMUNITY)
Admission: RE | Admit: 2015-10-22 | Discharge: 2015-10-22 | Disposition: A | Discharge status: Home / Self Care | Payer: Self-pay | Source: Ambulatory Visit | Attending: Cardiology | Admitting: Cardiology

## 2015-10-25 ENCOUNTER — Encounter (HOSPITAL_COMMUNITY): Payer: Self-pay

## 2015-10-26 ENCOUNTER — Ambulatory Visit
Admission: RE | Admit: 2015-10-26 | Discharge: 2015-10-26 | Disposition: A | Discharge status: Home / Self Care | Payer: Medicare Other | Source: Ambulatory Visit | Attending: Interventional Radiology | Admitting: Interventional Radiology

## 2015-10-26 ENCOUNTER — Encounter (HOSPITAL_COMMUNITY): Payer: Self-pay

## 2015-10-26 ENCOUNTER — Ambulatory Visit (HOSPITAL_COMMUNITY)
Admission: RE | Admit: 2015-10-26 | Discharge: 2015-10-26 | Disposition: A | Discharge status: Home / Self Care | Payer: Medicare Other | Source: Ambulatory Visit | Attending: Interventional Radiology | Admitting: Interventional Radiology

## 2015-10-26 DIAGNOSIS — IMO0001 Reserved for inherently not codable concepts without codable children: Secondary | ICD-10-CM

## 2015-10-26 DIAGNOSIS — Z09 Encounter for follow-up examination after completed treatment for conditions other than malignant neoplasm: Secondary | ICD-10-CM | POA: Diagnosis not present

## 2015-10-26 DIAGNOSIS — Z9889 Other specified postprocedural states: Secondary | ICD-10-CM | POA: Insufficient documentation

## 2015-10-26 DIAGNOSIS — T82330A Leakage of aortic (bifurcation) graft (replacement), initial encounter: Secondary | ICD-10-CM | POA: Insufficient documentation

## 2015-10-26 DIAGNOSIS — I714 Abdominal aortic aneurysm, without rupture: Secondary | ICD-10-CM | POA: Insufficient documentation

## 2015-10-26 DIAGNOSIS — T82330D Leakage of aortic (bifurcation) graft (replacement), subsequent encounter: Secondary | ICD-10-CM

## 2015-10-26 DIAGNOSIS — Z8679 Personal history of other diseases of the circulatory system: Secondary | ICD-10-CM | POA: Diagnosis present

## 2015-10-26 DIAGNOSIS — IMO0002 Reserved for concepts with insufficient information to code with codable children: Secondary | ICD-10-CM | POA: Insufficient documentation

## 2015-10-26 MED ORDER — IOHEXOL 350 MG/ML SOLN
100.0000 mL | Freq: Once | INTRAVENOUS | Status: AC | PRN
  Administered 2015-10-26: 100 mL via INTRAVENOUS

## 2015-10-26 NOTE — Progress Notes (Signed)
Chief Complaint: Patient was seen in consultation today for  Chief Complaint  Patient presents with  . Follow-up    Type 2 endoleak (post EVAR)     at the request of McCullough,Heath  Referring Physician(s): McCullough,Heath  History of Present Illness: Garrett Hottle. is a 74 y.o. male who underwent successful EVAR endoleak repair on 9/2 with Dr. Laurence Ferrari. He's been doing very well since the procedure and is here for follow up after his CTA today. No new c/o. Has some occasional (L)shoulder blade pain, unrelated. No N/V, no abdominal pain. PMHx, meds, imaging reviewed.  Past Medical History  Diagnosis Date  . AAA (abdominal aortic aneurysm) ([place])   . CAD (coronary artery disease)   . Hypertension   . Hyperlipidemia   . Thyroid disease     hypothyroidism  . Reflux   . Gout   . Nerve compression     right leg  . Hypothyroidism   . Seasonal allergies   . GERD (gastroesophageal reflux disease)   . Myocardial infarction ([place]) 01/1991    sp inferior  . Atrial fibrillation ([place])   . Kidney stone     right kidney  . Arthritis     knees  . Cancer [facility])     prostate    Past Surgical History  Procedure Laterality Date  . Parathyroid adenoma    . Spine surgery    . Kidney stone surgery    . Tonsillectomy    . Prostate surgery      rad seeds  . Coronary stent placement  1992 and  2009    RCA  . Abdominal aortagram embolization  02/28/2012  . Coronary artery bypass graft  04/2009  . Abdominal aortic aneurysm repair  Sept. 2013  . Abdominal aortagram N/A 02/28/2012    Procedure: ABDOMINAL Maxcine Ham;  Surgeon: Serafina Mitchell, MD;  Location: [facility] CATH LAB;  Service: Cardiovascular;  Laterality: N/A;  . Embolization Right 02/28/2012    Procedure: EMBOLIZATION;  Surgeon: Serafina Mitchell, MD;  Location: [facility] CATH LAB;  Service: Cardiovascular;  Laterality: Right;  . Left heart catheterization with coronary/graft angiogram N/A 01/08/2015    Procedure: LEFT HEART  CATHETERIZATION WITH Beatrix Fetters;  Surgeon: Jacolyn Reedy, MD;  Location: [facility] CATH LAB;  Service: Cardiovascular;  Laterality: N/A;    Allergies: Ace inhibitors; Crestor; and Nitroglycerin  Medications: Prior to Admission medications   Medication Sig Start Date End Date Taking? Authorizing Provider  acetaminophen (TYLENOL) 650 MG CR tablet Take 650 mg by mouth daily as needed for pain.    Yes Historical Provider, MD  allopurinol (ZYLOPRIM) 300 MG tablet Take 300 mg by mouth every other day. Take in the evening - on even days of the month   Yes Historical Provider, MD  amLODipine (NORVASC) 10 MG tablet Take 10 mg by mouth daily.   Yes Historical Provider, MD  aspirin EC 81 MG tablet Take 81 mg by mouth at bedtime.    Yes Historical Provider, MD  Cholecalciferol (VITAMIN D3) 2000 UNITS TABS Take 2,000 Units by mouth daily.    Yes Historical Provider, MD  clonazePAM (KLONOPIN) 1 MG tablet Take 0.5 mg by mouth at bedtime.    Yes Historical Provider, MD  clopidogrel (PLAVIX) 75 MG tablet Take 75 mg by mouth daily. 03/09/15  Yes Historical Provider, MD  diltiazem (CARDIZEM CD) 240 MG 24 hr capsule Take 240 mg by mouth at bedtime.    Yes Historical Provider, MD  esomeprazole (  NEXIUM) 20 MG capsule Take 20 mg by mouth daily at 12 noon.   Yes Historical Provider, MD  fluticasone (FLONASE) 50 MCG/ACT nasal spray Place 2 sprays into both nostrils daily as needed for allergies.    Yes Historical Provider, MD  Glucose Blood (FREESTYLE TEST VI)  04/03/13  Yes Historical Provider, MD  hydroxypropyl methylcellulose / hypromellose (ISOPTO TEARS / GONIOVISC) 2.5 % ophthalmic solution Place 1 drop into both eyes as needed for dry eyes.   Yes Historical Provider, MD  levothyroxine (SYNTHROID, LEVOTHROID) 137 MCG tablet Take 137 mcg by mouth daily before breakfast.   Yes Historical Provider, MD  loratadine-pseudoephedrine (CLARITIN-D 24-HOUR) 10-240 MG per 24 hr tablet Take 1 tablet by mouth at  bedtime.    Yes Historical Provider, MD  metoprolol succinate (TOPROL-XL) 100 MG 24 hr tablet Take 1 tablet (100 mg total) by mouth daily. Take with or immediately following a meal. 01/08/15  Yes Jacolyn Reedy, MD  Multiple Vitamin (MULTIVITAMIN WITH MINERALS) TABS Take 1 tablet by mouth at bedtime.    Yes Historical Provider, MD  Multiple Vitamins-Minerals (PRESERVISION AREDS 2) CAPS Take 1 capsule by mouth 2 (two) times daily.   Yes Historical Provider, MD  olmesartan-hydrochlorothiazide (BENICAR HCT) 40-25 MG per tablet Take 1 tablet by mouth daily.    Yes Historical Provider, MD  Omega-3 Fatty Acids (FISH OIL) 1200 MG CAPS Take 1,200 mg by mouth 2 (two) times daily.   Yes Historical Provider, MD  rosuvastatin (CRESTOR) 10 MG tablet Take 10 mg by mouth as directed. Per patient, takes Mondays & Thursdays   Yes Historical Provider, MD  Tamsulosin HCl (FLOMAX) 0.4 MG CAPS Take 0.4 mg by mouth every other day. Take in the evening - on even days of the month   Yes Historical Provider, MD  vitamin B-12 (CYANOCOBALAMIN) 1000 MCG tablet Take 1,000 mcg by mouth at bedtime.    Yes Historical Provider, MD  famotidine (PEPCID) 20 MG tablet Take 20 mg by mouth at bedtime.     Historical Provider, MD  ofloxacin (OCUFLOX) 0.3 % ophthalmic solution Place 1 drop into the left eye 2 (two) times daily. 06/16/15   Historical Provider, MD  potassium chloride SA (K-DUR,KLOR-CON) 20 MEQ tablet Take 1 tablet (20 mEq total) by mouth daily. 01/08/15   Jacolyn Reedy, MD  PROLENSA 0.07 % SOLN Place 1 drop into the left eye daily. 06/16/15   Historical Provider, MD     Family History  Problem Relation Age of Onset  . Heart disease Father     Heart Disease before age 75  . Heart attack Father   . Hyperlipidemia Father   . Hypertension Father   . Stroke Mother   . Deep vein thrombosis Mother   . Heart disease Sister   . Diabetes Sister   . Hyperlipidemia Sister   . Hypertension Sister   . Heart attack Sister   .  Heart disease Paternal Uncle   . Diabetes Maternal Grandmother   . Diabetes Sister     Social History   Social History  . Marital Status: Divorced    Spouse Name: N/A  . Number of Children: N/A  . Years of Education: N/A   Social History Main Topics  . Smoking status: Former Smoker    Types: Cigarettes    Quit date: 08/01/1970  . Smokeless tobacco: Never Used  . Alcohol Use: 3.6 - 5.4 oz/week    4-5 Glasses of wine, 2-4 Cans of beer per week  Comment: 1-3 glasses 3-4 times a week  . Drug Use: No  . Sexual Activity: Not on file   Other Topics Concern  . Not on file   Social History Narrative    Review of Systems: A 12 point ROS discussed and pertinent positives are indicated in the HPI above.  All other systems are negative.  Review of Systems  Vital Signs: BP 149/81 mmHg  Pulse 57  Temp(Src) 97.6 F (36.4 C) (Oral)  Resp 14  Ht 5\' 10"  (1.778 m)  Wt 210 lb (95.255 kg)  BMI 30.13 kg/m2  SpO2 96%  Physical Exam  Constitutional: He is oriented to person, place, and time. He appears well-developed and well-nourished. No distress.  Cardiovascular: Normal rate, regular rhythm, normal heart sounds and intact distal pulses.   Pulmonary/Chest: Effort normal and breath sounds normal. No respiratory distress.  Abdominal: Soft. He exhibits no mass. There is no tenderness.  Neurological: He is alert and oriented to person, place, and time.  Psychiatric: He has a normal mood and affect. Judgment normal.     Imaging: No results found.  Labs:  CBC:  Recent Labs  01/07/15 1847 01/07/15 2345 01/08/15 0404 07/16/15 0652  WBC 5.4 5.1 4.6 5.0  HGB 16.1 15.2 15.1 15.0  HCT 43.4 42.0 42.5 42.2  PLT 171 165 169 173    COAGS:  Recent Labs  01/07/15 2345 01/08/15 0404 07/16/15 0652  INR 1.19 1.13 1.18  APTT 29  --   --     BMP:  Recent Labs  01/07/15 1847 01/07/15 2345 01/08/15 0404 05/21/15 0317 05/24/15 0810 07/16/15 0652  NA 137 138 139  --    --  138  K 3.4* 3.2* 3.1*  --   --  3.4*  CL 101 104 105  --   --  105  CO2 25 26 27   --   --  22  GLUCOSE 130* 130* 124*  --   --  152*  BUN 15 13 11   --  20 18  CALCIUM 10.0 9.4 9.3  --   --  9.6  CREATININE 1.17 1.07 1.10 1.30  --  1.23  GFRNONAA 60* 67* 65*  --   --  57*  GFRAA 70* 78* 75*  --   --  >60    LIVER FUNCTION TESTS:  Recent Labs  01/07/15 2345  BILITOT 0.9  AST 40*  ALT 61*  ALKPHOS 80  PROT 6.3  ALBUMIN 3.8    Assessment and Plan: Hx of EVAR with endoleak S/p Angiogram with successful coil embo of aneurysm sac and IMA origin on 9/2 CTA today showed no flow within sac, sac is stable in size, coils intact. Excellent result, pt to follow up with Dr. Arita Miss, recommend f/u CTA in 1 year unless clinically indicated earlier.  A copy of this report was sent to the requesting provider on this date.  SignedAscencion Dike 10/26/2015, 11:39 AM   I spent a total of 20 minutes in face to face in clinical consultation, greater than 50% of which was counseling/coordinating care for follow up of  endoleak repair.

## 2015-10-27 ENCOUNTER — Encounter (HOSPITAL_COMMUNITY)
Admission: RE | Admit: 2015-10-27 | Discharge: 2015-10-27 | Disposition: A | Discharge status: Home / Self Care | Payer: Self-pay | Source: Ambulatory Visit | Attending: Cardiology | Admitting: Cardiology

## 2015-10-29 ENCOUNTER — Encounter (HOSPITAL_COMMUNITY)
Admission: RE | Admit: 2015-10-29 | Discharge: 2015-10-29 | Disposition: A | Discharge status: Home / Self Care | Payer: Self-pay | Source: Ambulatory Visit | Attending: Cardiology | Admitting: Cardiology

## 2015-11-01 ENCOUNTER — Encounter (HOSPITAL_COMMUNITY)
Admission: RE | Admit: 2015-11-01 | Discharge: 2015-11-01 | Disposition: A | Discharge status: Home / Self Care | Payer: Self-pay | Source: Ambulatory Visit | Attending: Cardiology | Admitting: Cardiology

## 2015-11-03 ENCOUNTER — Encounter (HOSPITAL_COMMUNITY): Payer: Self-pay

## 2015-11-05 ENCOUNTER — Encounter (HOSPITAL_COMMUNITY)
Admission: RE | Admit: 2015-11-05 | Discharge: 2015-11-05 | Disposition: A | Discharge status: Home / Self Care | Payer: Self-pay | Source: Ambulatory Visit | Attending: Cardiology | Admitting: Cardiology

## 2015-11-10 ENCOUNTER — Encounter (HOSPITAL_COMMUNITY)
Admission: RE | Admit: 2015-11-10 | Discharge: 2015-11-10 | Disposition: A | Discharge status: Home / Self Care | Payer: Self-pay | Source: Ambulatory Visit | Attending: Cardiology | Admitting: Cardiology

## 2015-11-12 ENCOUNTER — Encounter (HOSPITAL_COMMUNITY)
Admission: RE | Admit: 2015-11-12 | Discharge: 2015-11-12 | Disposition: A | Discharge status: Home / Self Care | Payer: Self-pay | Source: Ambulatory Visit | Attending: Cardiology | Admitting: Cardiology

## 2015-11-17 ENCOUNTER — Encounter (HOSPITAL_COMMUNITY)
Admission: RE | Admit: 2015-11-17 | Discharge: 2015-11-17 | Disposition: A | Discharge status: Home / Self Care | Payer: Self-pay | Source: Ambulatory Visit | Attending: Cardiology | Admitting: Cardiology

## 2015-11-17 DIAGNOSIS — I209 Angina pectoris, unspecified: Secondary | ICD-10-CM | POA: Insufficient documentation

## 2015-11-19 ENCOUNTER — Encounter: Payer: Self-pay | Admitting: Surgery

## 2015-11-19 ENCOUNTER — Encounter (HOSPITAL_COMMUNITY)
Admission: RE | Admit: 2015-11-19 | Discharge: 2015-11-19 | Disposition: A | Discharge status: Home / Self Care | Payer: Self-pay | Source: Ambulatory Visit | Attending: Cardiology | Admitting: Cardiology

## 2015-11-22 ENCOUNTER — Encounter (HOSPITAL_COMMUNITY): Payer: Self-pay

## 2015-11-24 ENCOUNTER — Encounter (HOSPITAL_COMMUNITY)
Admission: RE | Admit: 2015-11-24 | Discharge: 2015-11-24 | Disposition: A | Discharge status: Home / Self Care | Payer: Self-pay | Source: Ambulatory Visit | Attending: Cardiology | Admitting: Cardiology

## 2015-11-26 ENCOUNTER — Encounter (HOSPITAL_COMMUNITY)
Admission: RE | Admit: 2015-11-26 | Discharge: 2015-11-26 | Disposition: A | Discharge status: Home / Self Care | Payer: Medicare Other | Source: Ambulatory Visit | Attending: Cardiology | Admitting: Cardiology

## 2015-11-29 ENCOUNTER — Other Ambulatory Visit (HOSPITAL_COMMUNITY): Payer: Medicare Other

## 2015-11-29 ENCOUNTER — Telehealth: Payer: Self-pay | Admitting: Surgery

## 2015-11-29 ENCOUNTER — Encounter (HOSPITAL_COMMUNITY): Payer: Self-pay

## 2015-11-29 ENCOUNTER — Encounter: Payer: Self-pay | Admitting: Surgery

## 2015-11-29 ENCOUNTER — Ambulatory Visit (INDEPENDENT_AMBULATORY_CARE_PROVIDER_SITE_OTHER): Payer: Medicare Other | Admitting: Surgery

## 2015-11-29 VITALS — BP 155/94 | HR 63 | Temp 97.2°F | Resp 16 | Ht 70.0 in | Wt 211.0 lb

## 2015-11-29 DIAGNOSIS — Z48812 Encounter for surgical aftercare following surgery on the circulatory system: Secondary | ICD-10-CM

## 2015-11-29 DIAGNOSIS — I716 Thoracoabdominal aortic aneurysm, without rupture, unspecified: Secondary | ICD-10-CM

## 2015-11-29 NOTE — Progress Notes (Signed)
Patient name: Garrett STCHARLES Sr. MRN: [id number] DOB: August 31, 1941 Sex: male     Chief Complaint  Patient presents with  . Re-evaluation    6 mo  AAA  f/u     Patient is taking Plavix and ASA  Had EVAR 08-08-12     HISTORY OF PRESENT ILLNESS: The patient is back today for followup of his abdominal aortic aneurysm. He initially underwent coil embolization of his right hypogastric artery. He developed severe buttock claudication therefore his aneurysm repair was delayed. His symptoms of claudication gradually improved and on 08/08/2012 he underwent endovascular repair of his aneurysm. 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   The last time I saw him in January 2016, his aneurysm sac had increased in size and therefore I sent him to interventional radiology for coil embolization which was done a few months later. A CT scan in December showed excellent position of the coils and no evidence of residual endoleak  Past Medical History  Diagnosis Date  . AAA (abdominal aortic aneurysm) ([place])   . CAD (coronary artery disease)   . Hypertension   . Hyperlipidemia   . Thyroid disease     hypothyroidism  . Reflux   . Gout   . Nerve compression     right leg  . Hypothyroidism   . Seasonal allergies   . GERD (gastroesophageal reflux disease)   . Myocardial infarction ([place]) 01/1991    sp inferior  . Atrial fibrillation ([place])   . Kidney stone     right kidney  . Arthritis     knees  . Cancer [facility])     prostate    Past Surgical History  Procedure Laterality Date  . Parathyroid adenoma    . Spine surgery    . Kidney stone surgery    . Tonsillectomy    . Prostate surgery      rad seeds  . Coronary stent placement  1992 and  2009    RCA  . Abdominal aortagram embolization  02/28/2012  . Coronary artery bypass graft  04/2009  . Abdominal aortic aneurysm repair  Sept. 2013  . Abdominal aortagram N/A 02/28/2012   Procedure: ABDOMINAL Maxcine Ham;  Surgeon: Serafina Mitchell, MD;  Location: [facility] CATH LAB;  Service: Cardiovascular;  Laterality: N/A;  . Embolization Right 02/28/2012    Procedure: EMBOLIZATION;  Surgeon: Serafina Mitchell, MD;  Location: [facility] CATH LAB;  Service: Cardiovascular;  Laterality: Right;  . Left heart catheterization with coronary/graft angiogram N/A 01/08/2015    Procedure: LEFT HEART CATHETERIZATION WITH Beatrix Fetters;  Surgeon: Jacolyn Reedy, MD;  Location: [facility] CATH LAB;  Service: Cardiovascular;  Laterality: N/A;    Social History   Social History  . Marital Status: Divorced    Spouse Name: N/A  . Number of Children: N/A  . Years of Education: N/A   Occupational History  . Not on file.   Social History Main Topics  . Smoking status: Former Smoker    Types: Cigarettes    Quit date: 08/01/1970  . Smokeless tobacco: Never Used  . Alcohol Use: 3.6 - 5.4 oz/week    4-5 Glasses of wine, 2-4 Cans of beer per week     Comment: 1-3 glasses 3-4 times a week  . Drug Use: No  . Sexual Activity: Not on file   Other Topics Concern  . Not on file   Social History Narrative  Family History  Problem Relation Age of Onset  . Heart disease Father     Heart Disease before age 100  . Heart attack Father   . Hyperlipidemia Father   . Hypertension Father   . Stroke Mother   . Deep vein thrombosis Mother   . Heart disease Sister   . Diabetes Sister   . Hyperlipidemia Sister   . Hypertension Sister   . Heart attack Sister   . Heart disease Paternal Uncle   . Diabetes Maternal Grandmother   . Diabetes Sister     Allergies as of 11/29/2015 - Review Complete 11/29/2015  Allergen Reaction Noted  . Ace inhibitors Cough 07/03/2011  . Crestor [rosuvastatin] Other (See Comments) 03/29/2014  . Nitroglycerin Other (See Comments) 08/21/2011    Current Outpatient Prescriptions on File Prior to Visit  Medication Sig Dispense Refill  . allopurinol (ZYLOPRIM) 300 MG tablet  Take 300 mg by mouth every other day. Take in the evening - on even days of the month    . amLODipine (NORVASC) 10 MG tablet Take 10 mg by mouth daily.    Marland Kitchen aspirin EC 81 MG tablet Take 81 mg by mouth at bedtime.     . Cholecalciferol (VITAMIN D3) 2000 UNITS TABS Take 2,000 Units by mouth daily.     . clonazePAM (KLONOPIN) 1 MG tablet Take 0.5 mg by mouth at bedtime.     . clopidogrel (PLAVIX) 75 MG tablet Take 75 mg by mouth daily.  12  . esomeprazole (NEXIUM) 20 MG capsule Take 20 mg by mouth daily at 12 noon.    . fluticasone (FLONASE) 50 MCG/ACT nasal spray Place 2 sprays into both nostrils daily as needed for allergies.     . Glucose Blood (FREESTYLE TEST VI)     . levothyroxine (SYNTHROID, LEVOTHROID) 137 MCG tablet Take 150 mcg by mouth daily before breakfast.     . loratadine-pseudoephedrine (CLARITIN-D 24-HOUR) 10-240 MG per 24 hr tablet Take 1 tablet by mouth at bedtime.     . metoprolol succinate (TOPROL-XL) 100 MG 24 hr tablet Take 1 tablet (100 mg total) by mouth daily. Take with or immediately following a meal. 30 tablet 12  . Multiple Vitamin (MULTIVITAMIN WITH MINERALS) TABS Take 1 tablet by mouth at bedtime.     . Multiple Vitamins-Minerals (PRESERVISION AREDS 2) CAPS Take 1 capsule by mouth 2 (two) times daily.    Marland Kitchen olmesartan-hydrochlorothiazide (BENICAR HCT) 40-25 MG per tablet Take 1 tablet by mouth daily.     . Omega-3 Fatty Acids (FISH OIL) 1200 MG CAPS Take 1,200 mg by mouth 2 (two) times daily.    . potassium chloride SA (K-DUR,KLOR-CON) 20 MEQ tablet Take 1 tablet (20 mEq total) by mouth daily. 30 tablet 12  . PROLENSA 0.07 % SOLN Place 1 drop into the left eye daily. Reported on 11/19/2015  1  . rosuvastatin (CRESTOR) 10 MG tablet Take 10 mg by mouth as directed. Per patient, takes Mondays & Thursdays    . spironolactone (ALDACTONE) 25 MG tablet Take 12.5 mg by mouth every evening.     . Tamsulosin HCl (FLOMAX) 0.4 MG CAPS Take 0.4 mg by mouth every other day. Take in the  evening - on even days of the month    . vitamin B-12 (CYANOCOBALAMIN) 1000 MCG tablet Take 2,500 mcg by mouth at bedtime.     Marland Kitchen acetaminophen (TYLENOL) 650 MG CR tablet Take 650 mg by mouth daily as needed for pain. Reported on 11/29/2015    .  diltiazem (CARDIZEM CD) 240 MG 24 hr capsule Take 240 mg by mouth at bedtime. Reported on 11/29/2015    . famotidine (PEPCID) 20 MG tablet Take 20 mg by mouth at bedtime. Reported on 11/29/2015    . hydroxypropyl methylcellulose / hypromellose (ISOPTO TEARS / GONIOVISC) 2.5 % ophthalmic solution Place 1 drop into both eyes as needed for dry eyes. Reported on 11/29/2015    . ofloxacin (OCUFLOX) 0.3 % ophthalmic solution Place 1 drop into the left eye 2 (two) times daily. Reported on 11/29/2015  1   No current facility-administered medications on file prior to visit.     REVIEW OF SYSTEMS: Cardiovascular: No chest pain, chest pressure, palpitations, orthopnea, or dyspnea on exertion. No claudication or rest pain,  No history of DVT or phlebitis. Pulmonary: No productive cough, asthma or wheezing. Neurologic: No weakness, paresthesias, aphasia, or amaurosis. No dizziness. Hematologic: No bleeding problems or clotting disorders. Musculoskeletal: No joint pain or joint swelling. Gastrointestinal: No blood in stool or hematemesis Genitourinary: No dysuria or hematuria. Psychiatric:: No history of major depression. Integumentary: No rashes or ulcers. Constitutional: No fever or chills.  PHYSICAL EXAMINATION:   Vital signs are  Filed Vitals:   11/29/15 1032 11/29/15 1043  BP: 148/93 155/94  Pulse: 63 63  Temp: 97.2 F (36.2 C)   TempSrc: Oral   Resp: 16   Height: 5\' 10"  (1.778 m)   Weight: 211 lb (95.709 kg)   SpO2: 96%    Body mass index is 30.28 kg/(m^2). General: The patient appears their stated age. HEENT:  No gross abnormalities Pulmonary:  Non labored breathing Abdomen: Soft and non-tender Musculoskeletal: There are no major  deformities. Neurologic: No focal weakness or paresthesias are detected, Skin: There are no ulcer or rashes noted. Psychiatric: The patient has normal affect. Cardiovascular: There is a regular rate and rhythm without significant murmur appreciated. no carotid bruits.  Palpable pedal pulses   Diagnostic Studies  I have reviewed his CT scan.  The aneurysm sac measures 6.0 x 4.7.  Previously it was 6.0 x 4.8.  No evidence of endoleak  Assessment:  status post intervention aneurysm repair Plan:  the type II endoleak has resolved after coil embolization. The aneurysm sac is unchanged. I will have him follow-up in one year with a repeat CT scan.  The patient does have a 4.4 cm ascending aortic aneurysm.  I will refer this to CT surgery in one year with a CAT scan  V. Leia Alf, M.D. [facility] Office: [phone] Pager:  [phone]

## 2015-11-29 NOTE — Telephone Encounter (Signed)
Sent fax to Dr. Lucianne Lei Trigt's office, a reminder to call patient for a fu in 1 yr as per blue d/c 11-29-15

## 2015-11-29 NOTE — Addendum Note (Signed)
Addended by: Dorthula Rue L on: 11/29/2015 02:28 PM   Modules accepted: Orders

## 2015-11-29 NOTE — Patient Instructions (Signed)
Abdominal Aortic Aneurysm An aneurysm is a weakened or damaged part of an artery wall that bulges from the normal force of blood pumping through the body. An abdominal aortic aneurysm is an aneurysm that occurs in the lower part of the aorta, the main artery of the body.  The major concern with an abdominal aortic aneurysm is that it can enlarge and burst (rupture) or blood can flow between the layers of the wall of the aorta through a tear (aorticdissection). Both of these conditions can cause bleeding inside the body and can be life threatening unless diagnosed and treated promptly. CAUSES  The exact cause of an abdominal aortic aneurysm is unknown. Some contributing factors are:   A hardening of the arteries caused by the buildup of fat and other substances in the lining of a blood vessel (arteriosclerosis).  Inflammation of the walls of an artery (arteritis).   Connective tissue diseases, such as Marfan syndrome.   Abdominal trauma.   An infection, such as syphilis or staphylococcus, in the wall of the aorta (infectious aortitis) caused by bacteria. RISK FACTORS  Risk factors that contribute to an abdominal aortic aneurysm may include:  Age older than 60 years.   High blood pressure (hypertension).  Male gender.  Ethnicity (white race).  Obesity.  Family history of aneurysm (first degree relatives only).  Tobacco use. PREVENTION  The following healthy lifestyle habits may help decrease your risk of abdominal aortic aneurysm:  Quitting smoking. Smoking can raise your blood pressure and cause arteriosclerosis.  Limiting or avoiding alcohol.  Keeping your blood pressure, blood sugar level, and cholesterol levels within normal limits.  Decreasing your salt intake. In somepeople, too much salt can raise blood pressure and increase your risk of abdominal aortic aneurysm.  Eating a diet low in saturated fats and cholesterol.  Increasing your fiber intake by including  whole grains, vegetables, and fruits in your diet. Eating these foods may help lower blood pressure.  Maintaining a healthy weight.  Staying physically active and exercising regularly. SYMPTOMS  The symptoms of abdominal aortic aneurysm may vary depending on the size and rate of growth of the aneurysm.Most grow slowly and do not have any symptoms. When symptoms do occur, they may include:  Pain (abdomen, side, lower back, or groin). The pain may vary in intensity. A sudden onset of severe pain may indicate that the aneurysm has ruptured.  Feeling full after eating only small amounts of food.  Nausea or vomiting or both.  Feeling a pulsating lump in the abdomen.  Feeling faint or passing out. DIAGNOSIS  Since most unruptured abdominal aortic aneurysms have no symptoms, they are often discovered during diagnostic exams for other conditions. An aneurysm may be found during the following procedures:  Ultrasonography (A one-time screening for abdominal aortic aneurysm by ultrasonography is also recommended for all men aged 65-75 years who have ever smoked).  X-ray exams.  A computed tomography (CT).  Magnetic resonance imaging (MRI).  Angiography or arteriography. TREATMENT  Treatment of an abdominal aortic aneurysm depends on the size of your aneurysm, your age, and risk factors for rupture. Medication to control blood pressure and pain may be used to manage aneurysms smaller than 6 cm. Regular monitoring for enlargement may be recommended by your caregiver if:  The aneurysm is 3-4 cm in size (an annual ultrasonography may be recommended).  The aneurysm is 4-4.5 cm in size (an ultrasonography every 6 months may be recommended).  The aneurysm is larger than 4.5 cm in   size (your caregiver may ask that you be examined by a vascular surgeon). If your aneurysm is larger than 6 cm, surgical repair may be recommended. There are two main methods for repair of an aneurysm:   Endovascular  repair (a minimally invasive surgery). This is done most often.  Open repair. This method is used if an endovascular repair is not possible.   This information is not intended to replace advice given to you by your health care provider. Make sure you discuss any questions you have with your health care provider.   Document Released: 08/09/2005 Document Revised: 02/24/2013 Document Reviewed: 11/29/2012 Elsevier Interactive Patient Education 2016 [employer]  

## 2015-11-29 NOTE — Progress Notes (Signed)
Filed Vitals:   11/29/15 1032 11/29/15 1043  BP: 148/93 155/94  Pulse: 63 63  Temp: 97.2 F (36.2 C)   TempSrc: Oral   Resp: 16   Height: 5\' 10"  (1.778 m)   Weight: 211 lb (95.709 kg)   SpO2: 96%

## 2015-12-01 ENCOUNTER — Encounter (HOSPITAL_COMMUNITY)
Admission: RE | Admit: 2015-12-01 | Discharge: 2015-12-01 | Disposition: A | Discharge status: Home / Self Care | Payer: Self-pay | Source: Ambulatory Visit | Attending: Cardiology | Admitting: Cardiology

## 2015-12-03 ENCOUNTER — Encounter (HOSPITAL_COMMUNITY)
Admission: RE | Admit: 2015-12-03 | Discharge: 2015-12-03 | Disposition: A | Discharge status: Home / Self Care | Payer: Self-pay | Source: Ambulatory Visit | Attending: Cardiology | Admitting: Cardiology

## 2015-12-06 ENCOUNTER — Encounter (HOSPITAL_COMMUNITY)
Admission: RE | Admit: 2015-12-06 | Discharge: 2015-12-06 | Disposition: A | Discharge status: Home / Self Care | Payer: Self-pay | Source: Ambulatory Visit | Attending: Cardiology | Admitting: Cardiology

## 2015-12-08 ENCOUNTER — Encounter (HOSPITAL_COMMUNITY)
Admission: RE | Admit: 2015-12-08 | Discharge: 2015-12-08 | Disposition: A | Discharge status: Home / Self Care | Payer: Self-pay | Source: Ambulatory Visit | Attending: Cardiology | Admitting: Cardiology

## 2015-12-10 ENCOUNTER — Encounter (HOSPITAL_COMMUNITY)
Admission: RE | Admit: 2015-12-10 | Discharge: 2015-12-10 | Disposition: A | Discharge status: Home / Self Care | Payer: Self-pay | Source: Ambulatory Visit | Attending: Cardiology | Admitting: Cardiology

## 2015-12-13 ENCOUNTER — Encounter (HOSPITAL_COMMUNITY)
Admission: RE | Admit: 2015-12-13 | Discharge: 2015-12-13 | Disposition: A | Discharge status: Home / Self Care | Payer: Self-pay | Source: Ambulatory Visit | Attending: Cardiology | Admitting: Cardiology

## 2015-12-15 ENCOUNTER — Encounter (HOSPITAL_COMMUNITY)
Admission: RE | Admit: 2015-12-15 | Discharge: 2015-12-15 | Disposition: A | Discharge status: Home / Self Care | Payer: Self-pay | Source: Ambulatory Visit | Attending: Cardiology | Admitting: Cardiology

## 2015-12-15 DIAGNOSIS — I209 Angina pectoris, unspecified: Secondary | ICD-10-CM | POA: Insufficient documentation

## 2015-12-17 ENCOUNTER — Encounter (HOSPITAL_COMMUNITY)
Admission: RE | Admit: 2015-12-17 | Discharge: 2015-12-17 | Disposition: A | Discharge status: Home / Self Care | Payer: Self-pay | Source: Ambulatory Visit | Attending: Cardiology | Admitting: Cardiology

## 2015-12-20 ENCOUNTER — Encounter (HOSPITAL_COMMUNITY)
Admission: RE | Admit: 2015-12-20 | Discharge: 2015-12-20 | Disposition: A | Discharge status: Home / Self Care | Payer: Self-pay | Source: Ambulatory Visit | Attending: Cardiology | Admitting: Cardiology

## 2015-12-22 ENCOUNTER — Encounter (HOSPITAL_COMMUNITY)
Admission: RE | Admit: 2015-12-22 | Discharge: 2015-12-22 | Disposition: A | Discharge status: Home / Self Care | Payer: Self-pay | Source: Ambulatory Visit | Attending: Cardiology | Admitting: Cardiology

## 2015-12-24 ENCOUNTER — Encounter (HOSPITAL_COMMUNITY)
Admission: RE | Admit: 2015-12-24 | Discharge: 2015-12-24 | Disposition: A | Discharge status: Home / Self Care | Payer: Self-pay | Source: Ambulatory Visit | Attending: Cardiology | Admitting: Cardiology

## 2015-12-27 ENCOUNTER — Encounter (HOSPITAL_COMMUNITY)
Admission: RE | Admit: 2015-12-27 | Discharge: 2015-12-27 | Disposition: A | Discharge status: Home / Self Care | Payer: Self-pay | Source: Ambulatory Visit | Attending: Cardiology | Admitting: Cardiology

## 2015-12-29 ENCOUNTER — Encounter (HOSPITAL_COMMUNITY)
Admission: RE | Admit: 2015-12-29 | Discharge: 2015-12-29 | Disposition: A | Discharge status: Home / Self Care | Payer: Self-pay | Source: Ambulatory Visit | Attending: Cardiology | Admitting: Cardiology

## 2015-12-31 ENCOUNTER — Encounter (HOSPITAL_COMMUNITY)
Admission: RE | Admit: 2015-12-31 | Discharge: 2015-12-31 | Disposition: A | Discharge status: Home / Self Care | Payer: Self-pay | Source: Ambulatory Visit | Attending: Cardiology | Admitting: Cardiology

## 2016-01-03 ENCOUNTER — Encounter (HOSPITAL_COMMUNITY)
Admission: RE | Admit: 2016-01-03 | Discharge: 2016-01-03 | Disposition: A | Discharge status: Home / Self Care | Payer: Self-pay | Source: Ambulatory Visit | Attending: Cardiology | Admitting: Cardiology

## 2016-01-05 ENCOUNTER — Encounter (HOSPITAL_COMMUNITY)
Admission: RE | Admit: 2016-01-05 | Discharge: 2016-01-05 | Disposition: A | Discharge status: Home / Self Care | Payer: Self-pay | Source: Ambulatory Visit | Attending: Cardiology | Admitting: Cardiology

## 2016-01-07 ENCOUNTER — Encounter (HOSPITAL_COMMUNITY)
Admission: RE | Admit: 2016-01-07 | Discharge: 2016-01-07 | Disposition: A | Discharge status: Home / Self Care | Payer: Self-pay | Source: Ambulatory Visit | Attending: Cardiology | Admitting: Cardiology

## 2016-01-10 ENCOUNTER — Encounter (HOSPITAL_COMMUNITY)
Admission: RE | Admit: 2016-01-10 | Discharge: 2016-01-10 | Disposition: A | Discharge status: Home / Self Care | Payer: Self-pay | Source: Ambulatory Visit | Attending: Cardiology | Admitting: Cardiology

## 2016-01-12 ENCOUNTER — Encounter (HOSPITAL_COMMUNITY)
Admission: RE | Admit: 2016-01-12 | Discharge: 2016-01-12 | Disposition: A | Discharge status: Home / Self Care | Payer: Self-pay | Source: Ambulatory Visit | Attending: Cardiology | Admitting: Cardiology

## 2016-01-12 DIAGNOSIS — I209 Angina pectoris, unspecified: Secondary | ICD-10-CM | POA: Insufficient documentation

## 2016-01-14 ENCOUNTER — Encounter (HOSPITAL_COMMUNITY)
Admission: RE | Admit: 2016-01-14 | Discharge: 2016-01-14 | Disposition: A | Discharge status: Home / Self Care | Payer: Self-pay | Source: Ambulatory Visit | Attending: Cardiology | Admitting: Cardiology

## 2016-01-17 ENCOUNTER — Encounter (HOSPITAL_COMMUNITY): Admission: RE | Admit: 2016-01-17 | Payer: Self-pay | Source: Ambulatory Visit

## 2016-01-19 ENCOUNTER — Encounter (HOSPITAL_COMMUNITY)
Admission: RE | Admit: 2016-01-19 | Discharge: 2016-01-19 | Disposition: A | Discharge status: Home / Self Care | Payer: Self-pay | Source: Ambulatory Visit | Attending: Cardiology | Admitting: Cardiology

## 2016-01-21 ENCOUNTER — Encounter (HOSPITAL_COMMUNITY)
Admission: RE | Admit: 2016-01-21 | Discharge: 2016-01-21 | Disposition: A | Discharge status: Home / Self Care | Payer: Self-pay | Source: Ambulatory Visit | Attending: Cardiology | Admitting: Cardiology

## 2016-01-24 ENCOUNTER — Encounter (HOSPITAL_COMMUNITY)
Admission: RE | Admit: 2016-01-24 | Discharge: 2016-01-24 | Disposition: A | Discharge status: Home / Self Care | Payer: Self-pay | Source: Ambulatory Visit | Attending: Cardiology | Admitting: Cardiology

## 2016-01-26 ENCOUNTER — Encounter (HOSPITAL_COMMUNITY)
Admission: RE | Admit: 2016-01-26 | Discharge: 2016-01-26 | Disposition: A | Discharge status: Home / Self Care | Payer: Self-pay | Source: Ambulatory Visit | Attending: Cardiology | Admitting: Cardiology

## 2016-01-28 ENCOUNTER — Encounter (HOSPITAL_COMMUNITY)
Admission: RE | Admit: 2016-01-28 | Discharge: 2016-01-28 | Disposition: A | Discharge status: Home / Self Care | Payer: Self-pay | Source: Ambulatory Visit | Attending: Cardiology | Admitting: Cardiology

## 2016-01-31 ENCOUNTER — Encounter (HOSPITAL_COMMUNITY): Payer: Self-pay

## 2016-02-02 ENCOUNTER — Encounter (HOSPITAL_COMMUNITY)
Admission: RE | Admit: 2016-02-02 | Discharge: 2016-02-02 | Disposition: A | Discharge status: Home / Self Care | Payer: Self-pay | Source: Ambulatory Visit | Attending: Cardiology | Admitting: Cardiology

## 2016-02-04 ENCOUNTER — Encounter (HOSPITAL_COMMUNITY)
Admission: RE | Admit: 2016-02-04 | Discharge: 2016-02-04 | Disposition: A | Discharge status: Home / Self Care | Payer: Self-pay | Source: Ambulatory Visit | Attending: Cardiology | Admitting: Cardiology

## 2016-02-07 ENCOUNTER — Encounter (HOSPITAL_COMMUNITY)
Admission: RE | Admit: 2016-02-07 | Discharge: 2016-02-07 | Disposition: A | Discharge status: Home / Self Care | Payer: Self-pay | Source: Ambulatory Visit | Attending: Cardiology | Admitting: Cardiology

## 2016-02-09 ENCOUNTER — Encounter (HOSPITAL_COMMUNITY)
Admission: RE | Admit: 2016-02-09 | Discharge: 2016-02-09 | Disposition: A | Discharge status: Home / Self Care | Payer: Self-pay | Source: Ambulatory Visit | Attending: Cardiology | Admitting: Cardiology

## 2016-02-11 ENCOUNTER — Encounter (HOSPITAL_COMMUNITY)
Admission: RE | Admit: 2016-02-11 | Discharge: 2016-02-11 | Disposition: A | Discharge status: Home / Self Care | Payer: Self-pay | Source: Ambulatory Visit | Attending: Cardiology | Admitting: Cardiology

## 2016-02-14 ENCOUNTER — Encounter (HOSPITAL_COMMUNITY): Payer: Self-pay

## 2016-02-14 DIAGNOSIS — I209 Angina pectoris, unspecified: Secondary | ICD-10-CM | POA: Insufficient documentation

## 2016-02-16 ENCOUNTER — Encounter (HOSPITAL_COMMUNITY)
Admission: RE | Admit: 2016-02-16 | Discharge: 2016-02-16 | Disposition: A | Discharge status: Home / Self Care | Payer: Self-pay | Source: Ambulatory Visit | Attending: Cardiology | Admitting: Cardiology

## 2016-02-18 ENCOUNTER — Encounter (HOSPITAL_COMMUNITY)
Admission: RE | Admit: 2016-02-18 | Discharge: 2016-02-18 | Disposition: A | Discharge status: Home / Self Care | Payer: Self-pay | Source: Ambulatory Visit | Attending: Cardiology | Admitting: Cardiology

## 2016-02-21 ENCOUNTER — Encounter (HOSPITAL_COMMUNITY)
Admission: RE | Admit: 2016-02-21 | Discharge: 2016-02-21 | Disposition: A | Discharge status: Home / Self Care | Payer: Self-pay | Source: Ambulatory Visit | Attending: Cardiology | Admitting: Cardiology

## 2016-02-23 ENCOUNTER — Encounter (HOSPITAL_COMMUNITY): Payer: Self-pay

## 2016-02-25 ENCOUNTER — Encounter (HOSPITAL_COMMUNITY)
Admission: RE | Admit: 2016-02-25 | Discharge: 2016-02-25 | Disposition: A | Discharge status: Home / Self Care | Payer: Self-pay | Source: Ambulatory Visit | Attending: Cardiology | Admitting: Cardiology

## 2016-02-28 ENCOUNTER — Encounter (HOSPITAL_COMMUNITY)
Admission: RE | Admit: 2016-02-28 | Discharge: 2016-02-28 | Disposition: A | Discharge status: Home / Self Care | Payer: Self-pay | Source: Ambulatory Visit | Attending: Cardiology | Admitting: Cardiology

## 2016-03-01 ENCOUNTER — Encounter (HOSPITAL_COMMUNITY): Payer: Self-pay

## 2016-03-03 ENCOUNTER — Encounter (HOSPITAL_COMMUNITY): Payer: Self-pay

## 2016-03-06 ENCOUNTER — Encounter (HOSPITAL_COMMUNITY)
Admission: RE | Admit: 2016-03-06 | Discharge: 2016-03-06 | Disposition: A | Discharge status: Home / Self Care | Payer: Self-pay | Source: Ambulatory Visit | Attending: Cardiology | Admitting: Cardiology

## 2016-03-08 ENCOUNTER — Encounter (HOSPITAL_COMMUNITY)
Admission: RE | Admit: 2016-03-08 | Discharge: 2016-03-08 | Disposition: A | Discharge status: Home / Self Care | Payer: Self-pay | Source: Ambulatory Visit | Attending: Cardiology | Admitting: Cardiology

## 2016-03-10 ENCOUNTER — Encounter (HOSPITAL_COMMUNITY): Payer: Self-pay

## 2016-03-13 ENCOUNTER — Encounter (HOSPITAL_COMMUNITY)
Admission: RE | Admit: 2016-03-13 | Discharge: 2016-03-13 | Disposition: A | Discharge status: Home / Self Care | Payer: Self-pay | Source: Ambulatory Visit | Attending: Cardiology | Admitting: Cardiology

## 2016-03-13 DIAGNOSIS — I209 Angina pectoris, unspecified: Secondary | ICD-10-CM | POA: Insufficient documentation

## 2016-03-15 ENCOUNTER — Encounter (HOSPITAL_COMMUNITY)
Admission: RE | Admit: 2016-03-15 | Discharge: 2016-03-15 | Disposition: A | Discharge status: Home / Self Care | Payer: Self-pay | Source: Ambulatory Visit | Attending: Cardiology | Admitting: Cardiology

## 2016-03-17 ENCOUNTER — Encounter (HOSPITAL_COMMUNITY): Payer: Self-pay

## 2016-03-20 ENCOUNTER — Encounter (HOSPITAL_COMMUNITY)
Admission: RE | Admit: 2016-03-20 | Discharge: 2016-03-20 | Disposition: A | Discharge status: Home / Self Care | Payer: Self-pay | Source: Ambulatory Visit | Attending: Cardiology | Admitting: Cardiology

## 2016-03-22 ENCOUNTER — Encounter (HOSPITAL_COMMUNITY)
Admission: RE | Admit: 2016-03-22 | Discharge: 2016-03-22 | Disposition: A | Discharge status: Home / Self Care | Payer: Self-pay | Source: Ambulatory Visit | Attending: Cardiology | Admitting: Cardiology

## 2016-03-24 ENCOUNTER — Encounter (HOSPITAL_COMMUNITY): Payer: Self-pay

## 2016-03-27 ENCOUNTER — Encounter (HOSPITAL_COMMUNITY)
Admission: RE | Admit: 2016-03-27 | Discharge: 2016-03-27 | Disposition: A | Discharge status: Home / Self Care | Payer: Self-pay | Source: Ambulatory Visit | Attending: Cardiology | Admitting: Cardiology

## 2016-03-29 ENCOUNTER — Encounter (HOSPITAL_COMMUNITY)
Admission: RE | Admit: 2016-03-29 | Discharge: 2016-03-29 | Disposition: A | Discharge status: Home / Self Care | Payer: Self-pay | Source: Ambulatory Visit | Attending: Cardiology | Admitting: Cardiology

## 2016-03-31 ENCOUNTER — Encounter (HOSPITAL_COMMUNITY): Payer: Self-pay

## 2016-04-03 ENCOUNTER — Encounter (HOSPITAL_COMMUNITY)
Admission: RE | Admit: 2016-04-03 | Discharge: 2016-04-03 | Disposition: A | Discharge status: Home / Self Care | Payer: Self-pay | Source: Ambulatory Visit | Attending: Cardiology | Admitting: Cardiology

## 2016-04-05 ENCOUNTER — Encounter (HOSPITAL_COMMUNITY)
Admission: RE | Admit: 2016-04-05 | Discharge: 2016-04-05 | Disposition: A | Discharge status: Home / Self Care | Payer: Self-pay | Source: Ambulatory Visit | Attending: Cardiology | Admitting: Cardiology

## 2016-04-07 ENCOUNTER — Encounter (HOSPITAL_COMMUNITY): Payer: Self-pay

## 2016-04-12 ENCOUNTER — Encounter (HOSPITAL_COMMUNITY)
Admission: RE | Admit: 2016-04-12 | Discharge: 2016-04-12 | Disposition: A | Discharge status: Home / Self Care | Payer: Self-pay | Source: Ambulatory Visit | Attending: Cardiology | Admitting: Cardiology

## 2016-04-14 ENCOUNTER — Encounter (HOSPITAL_COMMUNITY)
Admission: RE | Admit: 2016-04-14 | Discharge: 2016-04-14 | Disposition: A | Discharge status: Home / Self Care | Payer: Self-pay | Source: Ambulatory Visit | Attending: Cardiology | Admitting: Cardiology

## 2016-04-14 DIAGNOSIS — I209 Angina pectoris, unspecified: Secondary | ICD-10-CM | POA: Insufficient documentation

## 2016-04-17 ENCOUNTER — Encounter (HOSPITAL_COMMUNITY)
Admission: RE | Admit: 2016-04-17 | Discharge: 2016-04-17 | Disposition: A | Discharge status: Home / Self Care | Payer: Self-pay | Source: Ambulatory Visit | Attending: Cardiology | Admitting: Cardiology

## 2016-04-19 ENCOUNTER — Encounter (HOSPITAL_COMMUNITY)
Admission: RE | Admit: 2016-04-19 | Discharge: 2016-04-19 | Disposition: A | Discharge status: Home / Self Care | Payer: Self-pay | Source: Ambulatory Visit | Attending: Cardiology | Admitting: Cardiology

## 2016-04-21 ENCOUNTER — Encounter (HOSPITAL_COMMUNITY): Payer: Self-pay

## 2016-04-24 ENCOUNTER — Encounter (HOSPITAL_COMMUNITY): Payer: Self-pay

## 2016-04-26 ENCOUNTER — Encounter (HOSPITAL_COMMUNITY)
Admission: RE | Admit: 2016-04-26 | Discharge: 2016-04-26 | Disposition: A | Discharge status: Home / Self Care | Payer: Self-pay | Source: Ambulatory Visit | Attending: Cardiology | Admitting: Cardiology

## 2016-04-28 ENCOUNTER — Encounter (HOSPITAL_COMMUNITY)
Admission: RE | Admit: 2016-04-28 | Discharge: 2016-04-28 | Disposition: A | Discharge status: Home / Self Care | Payer: Self-pay | Source: Ambulatory Visit | Attending: Cardiology | Admitting: Cardiology

## 2016-05-01 ENCOUNTER — Encounter (HOSPITAL_COMMUNITY): Payer: Self-pay

## 2016-05-03 ENCOUNTER — Encounter (HOSPITAL_COMMUNITY)
Admission: RE | Admit: 2016-05-03 | Discharge: 2016-05-03 | Disposition: A | Discharge status: Home / Self Care | Payer: Self-pay | Source: Ambulatory Visit | Attending: Cardiology | Admitting: Cardiology

## 2016-05-05 ENCOUNTER — Encounter (HOSPITAL_COMMUNITY)
Admission: RE | Admit: 2016-05-05 | Discharge: 2016-05-05 | Disposition: A | Discharge status: Home / Self Care | Payer: Self-pay | Source: Ambulatory Visit | Attending: Cardiology | Admitting: Cardiology

## 2016-05-08 ENCOUNTER — Encounter (HOSPITAL_COMMUNITY): Payer: Self-pay

## 2016-05-10 ENCOUNTER — Encounter (HOSPITAL_COMMUNITY): Payer: Self-pay

## 2016-05-12 ENCOUNTER — Encounter (HOSPITAL_COMMUNITY)
Admission: RE | Admit: 2016-05-12 | Discharge: 2016-05-12 | Disposition: A | Discharge status: Home / Self Care | Payer: Self-pay | Source: Ambulatory Visit | Attending: Cardiology | Admitting: Cardiology

## 2016-05-15 ENCOUNTER — Encounter (HOSPITAL_COMMUNITY)
Admission: RE | Admit: 2016-05-15 | Discharge: 2016-05-15 | Disposition: A | Discharge status: Home / Self Care | Payer: Self-pay | Source: Ambulatory Visit | Attending: Cardiology | Admitting: Cardiology

## 2016-05-15 DIAGNOSIS — I209 Angina pectoris, unspecified: Secondary | ICD-10-CM | POA: Insufficient documentation

## 2016-05-17 ENCOUNTER — Encounter (HOSPITAL_COMMUNITY)
Admission: RE | Admit: 2016-05-17 | Discharge: 2016-05-17 | Disposition: A | Discharge status: Home / Self Care | Payer: Self-pay | Source: Ambulatory Visit | Attending: Cardiology | Admitting: Cardiology

## 2016-05-19 ENCOUNTER — Encounter (HOSPITAL_COMMUNITY): Payer: Self-pay

## 2016-05-22 ENCOUNTER — Encounter (HOSPITAL_COMMUNITY)
Admission: RE | Admit: 2016-05-22 | Discharge: 2016-05-22 | Disposition: A | Discharge status: Home / Self Care | Payer: Self-pay | Source: Ambulatory Visit | Attending: Cardiology | Admitting: Cardiology

## 2016-05-24 ENCOUNTER — Encounter (HOSPITAL_COMMUNITY): Payer: Self-pay

## 2016-05-26 ENCOUNTER — Encounter (HOSPITAL_COMMUNITY): Payer: Self-pay

## 2016-05-29 ENCOUNTER — Encounter (HOSPITAL_COMMUNITY)
Admission: RE | Admit: 2016-05-29 | Discharge: 2016-05-29 | Disposition: A | Discharge status: Home / Self Care | Payer: Self-pay | Source: Ambulatory Visit | Attending: Cardiology | Admitting: Cardiology

## 2016-05-31 ENCOUNTER — Encounter (HOSPITAL_COMMUNITY)
Admission: RE | Admit: 2016-05-31 | Discharge: 2016-05-31 | Disposition: A | Discharge status: Home / Self Care | Payer: Self-pay | Source: Ambulatory Visit | Attending: Cardiology | Admitting: Cardiology

## 2016-06-02 ENCOUNTER — Encounter (HOSPITAL_COMMUNITY)
Admission: RE | Admit: 2016-06-02 | Discharge: 2016-06-02 | Disposition: A | Discharge status: Home / Self Care | Payer: Self-pay | Source: Ambulatory Visit | Attending: Cardiology | Admitting: Cardiology

## 2016-06-05 ENCOUNTER — Encounter (HOSPITAL_COMMUNITY): Payer: Self-pay

## 2016-06-07 ENCOUNTER — Encounter (HOSPITAL_COMMUNITY): Payer: Self-pay

## 2016-06-09 ENCOUNTER — Encounter (HOSPITAL_COMMUNITY): Payer: Self-pay

## 2016-06-12 ENCOUNTER — Encounter (HOSPITAL_COMMUNITY)
Admission: RE | Admit: 2016-06-12 | Discharge: 2016-06-12 | Disposition: A | Discharge status: Home / Self Care | Payer: Self-pay | Source: Ambulatory Visit | Attending: Cardiology | Admitting: Cardiology

## 2016-06-14 ENCOUNTER — Encounter (HOSPITAL_COMMUNITY)
Admission: RE | Admit: 2016-06-14 | Discharge: 2016-06-14 | Disposition: A | Discharge status: Home / Self Care | Payer: Self-pay | Source: Ambulatory Visit | Attending: Cardiology | Admitting: Cardiology

## 2016-06-14 DIAGNOSIS — I209 Angina pectoris, unspecified: Secondary | ICD-10-CM | POA: Insufficient documentation

## 2016-06-16 ENCOUNTER — Encounter (HOSPITAL_COMMUNITY)
Admission: RE | Admit: 2016-06-16 | Discharge: 2016-06-16 | Disposition: A | Discharge status: Home / Self Care | Payer: Self-pay | Source: Ambulatory Visit | Attending: Cardiology | Admitting: Cardiology

## 2016-06-19 ENCOUNTER — Encounter (HOSPITAL_COMMUNITY)
Admission: RE | Admit: 2016-06-19 | Discharge: 2016-06-19 | Disposition: A | Discharge status: Home / Self Care | Payer: Self-pay | Source: Ambulatory Visit | Attending: Cardiology | Admitting: Cardiology

## 2016-06-21 ENCOUNTER — Encounter (HOSPITAL_COMMUNITY): Payer: Self-pay

## 2016-06-23 ENCOUNTER — Encounter (HOSPITAL_COMMUNITY): Payer: Self-pay

## 2016-06-26 ENCOUNTER — Encounter (HOSPITAL_COMMUNITY): Payer: Self-pay

## 2016-06-28 ENCOUNTER — Encounter (HOSPITAL_COMMUNITY)
Admission: RE | Admit: 2016-06-28 | Discharge: 2016-06-28 | Disposition: A | Discharge status: Home / Self Care | Payer: Self-pay | Source: Ambulatory Visit | Attending: Cardiology | Admitting: Cardiology

## 2016-06-30 ENCOUNTER — Encounter (HOSPITAL_COMMUNITY): Payer: Self-pay

## 2016-07-03 ENCOUNTER — Encounter (HOSPITAL_COMMUNITY)
Admission: RE | Admit: 2016-07-03 | Discharge: 2016-07-03 | Disposition: A | Discharge status: Home / Self Care | Payer: Self-pay | Source: Ambulatory Visit | Attending: Cardiology | Admitting: Cardiology

## 2016-07-05 ENCOUNTER — Encounter (HOSPITAL_COMMUNITY)
Admission: RE | Admit: 2016-07-05 | Discharge: 2016-07-05 | Disposition: A | Discharge status: Home / Self Care | Payer: Self-pay | Source: Ambulatory Visit | Attending: Cardiology | Admitting: Cardiology

## 2016-07-07 ENCOUNTER — Encounter (HOSPITAL_COMMUNITY): Payer: Self-pay

## 2016-07-10 ENCOUNTER — Encounter (HOSPITAL_COMMUNITY)
Admission: RE | Admit: 2016-07-10 | Discharge: 2016-07-10 | Disposition: A | Discharge status: Home / Self Care | Payer: Self-pay | Source: Ambulatory Visit | Attending: Cardiology | Admitting: Cardiology

## 2016-07-12 ENCOUNTER — Encounter (HOSPITAL_COMMUNITY)
Admission: RE | Admit: 2016-07-12 | Discharge: 2016-07-12 | Disposition: A | Discharge status: Home / Self Care | Payer: Self-pay | Source: Ambulatory Visit | Attending: Cardiology | Admitting: Cardiology

## 2016-07-14 ENCOUNTER — Encounter (HOSPITAL_COMMUNITY): Payer: Self-pay

## 2016-07-14 DIAGNOSIS — I209 Angina pectoris, unspecified: Secondary | ICD-10-CM | POA: Insufficient documentation

## 2016-07-19 ENCOUNTER — Encounter (HOSPITAL_COMMUNITY)
Admission: RE | Admit: 2016-07-19 | Discharge: 2016-07-19 | Disposition: A | Discharge status: Home / Self Care | Payer: Self-pay | Source: Ambulatory Visit | Attending: Cardiology | Admitting: Cardiology

## 2016-07-21 ENCOUNTER — Encounter (HOSPITAL_COMMUNITY)
Admission: RE | Admit: 2016-07-21 | Discharge: 2016-07-21 | Disposition: A | Discharge status: Home / Self Care | Payer: Self-pay | Source: Ambulatory Visit | Attending: Cardiology | Admitting: Cardiology

## 2016-07-24 ENCOUNTER — Encounter (HOSPITAL_COMMUNITY)
Admission: RE | Admit: 2016-07-24 | Discharge: 2016-07-24 | Disposition: A | Discharge status: Home / Self Care | Payer: Self-pay | Source: Ambulatory Visit | Attending: Cardiology | Admitting: Cardiology

## 2016-07-26 ENCOUNTER — Encounter (HOSPITAL_COMMUNITY)
Admission: RE | Admit: 2016-07-26 | Discharge: 2016-07-26 | Disposition: A | Discharge status: Home / Self Care | Payer: Self-pay | Source: Ambulatory Visit | Attending: Cardiology | Admitting: Cardiology

## 2016-07-28 ENCOUNTER — Encounter (HOSPITAL_COMMUNITY): Payer: Self-pay

## 2016-07-31 ENCOUNTER — Encounter (HOSPITAL_COMMUNITY)
Admission: RE | Admit: 2016-07-31 | Discharge: 2016-07-31 | Disposition: A | Discharge status: Home / Self Care | Payer: Self-pay | Source: Ambulatory Visit | Attending: Cardiology | Admitting: Cardiology

## 2016-08-02 ENCOUNTER — Encounter (HOSPITAL_COMMUNITY)
Admission: RE | Admit: 2016-08-02 | Discharge: 2016-08-02 | Disposition: A | Discharge status: Home / Self Care | Payer: Self-pay | Source: Ambulatory Visit | Attending: Cardiology | Admitting: Cardiology

## 2016-08-04 ENCOUNTER — Encounter (HOSPITAL_COMMUNITY): Payer: Self-pay

## 2016-08-07 ENCOUNTER — Encounter (HOSPITAL_COMMUNITY)
Admission: RE | Admit: 2016-08-07 | Discharge: 2016-08-07 | Disposition: A | Discharge status: Home / Self Care | Payer: Self-pay | Source: Ambulatory Visit | Attending: Cardiology | Admitting: Cardiology

## 2016-08-09 ENCOUNTER — Encounter (HOSPITAL_COMMUNITY)
Admission: RE | Admit: 2016-08-09 | Discharge: 2016-08-09 | Disposition: A | Discharge status: Home / Self Care | Payer: Self-pay | Source: Ambulatory Visit | Attending: Cardiology | Admitting: Cardiology

## 2016-08-11 ENCOUNTER — Encounter (HOSPITAL_COMMUNITY): Payer: Self-pay

## 2016-08-16 ENCOUNTER — Encounter (HOSPITAL_COMMUNITY)
Admission: RE | Admit: 2016-08-16 | Discharge: 2016-08-16 | Disposition: A | Discharge status: Home / Self Care | Payer: Self-pay | Source: Ambulatory Visit | Attending: Cardiology | Admitting: Cardiology

## 2016-08-16 DIAGNOSIS — I209 Angina pectoris, unspecified: Secondary | ICD-10-CM | POA: Insufficient documentation

## 2016-08-21 ENCOUNTER — Encounter (HOSPITAL_COMMUNITY)
Admission: RE | Admit: 2016-08-21 | Discharge: 2016-08-21 | Disposition: A | Discharge status: Home / Self Care | Payer: Self-pay | Source: Ambulatory Visit | Attending: Cardiology | Admitting: Cardiology

## 2016-08-23 ENCOUNTER — Encounter (HOSPITAL_COMMUNITY)
Admission: RE | Admit: 2016-08-23 | Discharge: 2016-08-23 | Disposition: A | Discharge status: Home / Self Care | Payer: Self-pay | Source: Ambulatory Visit | Attending: Cardiology | Admitting: Cardiology

## 2016-08-25 ENCOUNTER — Encounter (HOSPITAL_COMMUNITY)
Admission: RE | Admit: 2016-08-25 | Discharge: 2016-08-25 | Disposition: A | Discharge status: Home / Self Care | Payer: Self-pay | Source: Ambulatory Visit | Attending: Cardiology | Admitting: Cardiology

## 2016-08-28 ENCOUNTER — Encounter (HOSPITAL_COMMUNITY)
Admission: RE | Admit: 2016-08-28 | Discharge: 2016-08-28 | Disposition: A | Discharge status: Home / Self Care | Payer: Self-pay | Source: Ambulatory Visit | Attending: Cardiology | Admitting: Cardiology

## 2016-08-30 ENCOUNTER — Encounter (HOSPITAL_COMMUNITY)
Admission: RE | Admit: 2016-08-30 | Discharge: 2016-08-30 | Disposition: A | Discharge status: Home / Self Care | Payer: Self-pay | Source: Ambulatory Visit | Attending: Cardiology | Admitting: Cardiology

## 2016-09-01 ENCOUNTER — Encounter (HOSPITAL_COMMUNITY): Payer: Self-pay

## 2016-09-04 ENCOUNTER — Encounter (HOSPITAL_COMMUNITY)
Admission: RE | Admit: 2016-09-04 | Discharge: 2016-09-04 | Disposition: A | Discharge status: Home / Self Care | Payer: Self-pay | Source: Ambulatory Visit | Attending: Cardiology | Admitting: Cardiology

## 2016-09-06 ENCOUNTER — Encounter (HOSPITAL_COMMUNITY)
Admission: RE | Admit: 2016-09-06 | Discharge: 2016-09-06 | Disposition: A | Discharge status: Home / Self Care | Payer: Self-pay | Source: Ambulatory Visit | Attending: Cardiology | Admitting: Cardiology

## 2016-09-08 ENCOUNTER — Encounter (HOSPITAL_COMMUNITY): Payer: Self-pay

## 2016-09-11 ENCOUNTER — Encounter (HOSPITAL_COMMUNITY)
Admission: RE | Admit: 2016-09-11 | Discharge: 2016-09-11 | Disposition: A | Discharge status: Home / Self Care | Payer: Self-pay | Source: Ambulatory Visit | Attending: Cardiology | Admitting: Cardiology

## 2016-09-13 ENCOUNTER — Encounter (HOSPITAL_COMMUNITY)
Admission: RE | Admit: 2016-09-13 | Discharge: 2016-09-13 | Disposition: A | Discharge status: Home / Self Care | Payer: Self-pay | Source: Ambulatory Visit | Attending: Cardiology | Admitting: Cardiology

## 2016-09-13 DIAGNOSIS — I209 Angina pectoris, unspecified: Secondary | ICD-10-CM | POA: Insufficient documentation

## 2016-09-15 ENCOUNTER — Encounter (HOSPITAL_COMMUNITY)
Admission: RE | Admit: 2016-09-15 | Discharge: 2016-09-15 | Disposition: A | Discharge status: Home / Self Care | Payer: Self-pay | Source: Ambulatory Visit | Attending: Cardiology | Admitting: Cardiology

## 2016-09-18 ENCOUNTER — Encounter (HOSPITAL_COMMUNITY)
Admission: RE | Admit: 2016-09-18 | Discharge: 2016-09-18 | Disposition: A | Discharge status: Home / Self Care | Payer: Self-pay | Source: Ambulatory Visit | Attending: Cardiology | Admitting: Cardiology

## 2016-09-20 ENCOUNTER — Encounter (HOSPITAL_COMMUNITY)
Admission: RE | Admit: 2016-09-20 | Discharge: 2016-09-20 | Disposition: A | Discharge status: Home / Self Care | Payer: Self-pay | Source: Ambulatory Visit | Attending: Cardiology | Admitting: Cardiology

## 2016-09-22 ENCOUNTER — Encounter (HOSPITAL_COMMUNITY): Payer: Self-pay

## 2016-09-25 ENCOUNTER — Encounter (HOSPITAL_COMMUNITY)
Admission: RE | Admit: 2016-09-25 | Discharge: 2016-09-25 | Disposition: A | Discharge status: Home / Self Care | Payer: Self-pay | Source: Ambulatory Visit | Attending: Cardiology | Admitting: Cardiology

## 2016-09-27 ENCOUNTER — Encounter (HOSPITAL_COMMUNITY)
Admission: RE | Admit: 2016-09-27 | Discharge: 2016-09-27 | Disposition: A | Discharge status: Home / Self Care | Payer: Self-pay | Source: Ambulatory Visit | Attending: Cardiology | Admitting: Cardiology

## 2016-09-29 ENCOUNTER — Encounter (HOSPITAL_COMMUNITY): Payer: Self-pay

## 2016-10-02 ENCOUNTER — Encounter (HOSPITAL_COMMUNITY)
Admission: RE | Admit: 2016-10-02 | Discharge: 2016-10-02 | Disposition: A | Discharge status: Home / Self Care | Payer: Self-pay | Source: Ambulatory Visit | Attending: Cardiology | Admitting: Cardiology

## 2016-10-04 ENCOUNTER — Encounter (HOSPITAL_COMMUNITY)
Admission: RE | Admit: 2016-10-04 | Discharge: 2016-10-04 | Disposition: A | Discharge status: Home / Self Care | Payer: Self-pay | Source: Ambulatory Visit | Attending: Cardiology | Admitting: Cardiology

## 2016-10-09 ENCOUNTER — Encounter (HOSPITAL_COMMUNITY)
Admission: RE | Admit: 2016-10-09 | Discharge: 2016-10-09 | Disposition: A | Discharge status: Home / Self Care | Payer: Self-pay | Source: Ambulatory Visit | Attending: Cardiology | Admitting: Cardiology

## 2016-10-11 ENCOUNTER — Encounter (HOSPITAL_COMMUNITY): Payer: Self-pay

## 2016-10-13 ENCOUNTER — Encounter (HOSPITAL_COMMUNITY): Payer: Self-pay

## 2016-10-13 DIAGNOSIS — I209 Angina pectoris, unspecified: Secondary | ICD-10-CM | POA: Insufficient documentation

## 2016-10-16 ENCOUNTER — Encounter (HOSPITAL_COMMUNITY)
Admission: RE | Admit: 2016-10-16 | Discharge: 2016-10-16 | Disposition: A | Discharge status: Home / Self Care | Payer: Self-pay | Source: Ambulatory Visit | Attending: Cardiology | Admitting: Cardiology

## 2016-10-18 ENCOUNTER — Encounter (HOSPITAL_COMMUNITY)
Admission: RE | Admit: 2016-10-18 | Discharge: 2016-10-18 | Disposition: A | Discharge status: Home / Self Care | Payer: Self-pay | Source: Ambulatory Visit | Attending: Cardiology | Admitting: Cardiology

## 2016-10-20 ENCOUNTER — Encounter (HOSPITAL_COMMUNITY)
Admission: RE | Admit: 2016-10-20 | Discharge: 2016-10-20 | Disposition: A | Discharge status: Home / Self Care | Payer: Self-pay | Source: Ambulatory Visit | Attending: Cardiology | Admitting: Cardiology

## 2016-10-23 ENCOUNTER — Encounter (HOSPITAL_COMMUNITY)
Admission: RE | Admit: 2016-10-23 | Discharge: 2016-10-23 | Disposition: A | Discharge status: Home / Self Care | Payer: Self-pay | Source: Ambulatory Visit | Attending: Cardiology | Admitting: Cardiology

## 2016-10-25 ENCOUNTER — Encounter (HOSPITAL_COMMUNITY)
Admission: RE | Admit: 2016-10-25 | Discharge: 2016-10-25 | Disposition: A | Discharge status: Home / Self Care | Payer: Medicare Other | Source: Ambulatory Visit | Attending: Cardiology | Admitting: Cardiology

## 2016-10-27 ENCOUNTER — Encounter (HOSPITAL_COMMUNITY)
Admission: RE | Admit: 2016-10-27 | Discharge: 2016-10-27 | Disposition: A | Discharge status: Home / Self Care | Payer: Self-pay | Source: Ambulatory Visit | Attending: Cardiology | Admitting: Cardiology

## 2016-10-30 ENCOUNTER — Encounter (HOSPITAL_COMMUNITY)
Admission: RE | Admit: 2016-10-30 | Discharge: 2016-10-30 | Disposition: A | Discharge status: Home / Self Care | Payer: Self-pay | Source: Ambulatory Visit | Attending: Cardiology | Admitting: Cardiology

## 2016-11-01 ENCOUNTER — Encounter (HOSPITAL_COMMUNITY)
Admission: RE | Admit: 2016-11-01 | Discharge: 2016-11-01 | Disposition: A | Discharge status: Home / Self Care | Payer: Self-pay | Source: Ambulatory Visit | Attending: Cardiology | Admitting: Cardiology

## 2016-11-03 ENCOUNTER — Encounter (HOSPITAL_COMMUNITY): Payer: Self-pay

## 2016-11-08 ENCOUNTER — Encounter (HOSPITAL_COMMUNITY)
Admission: RE | Admit: 2016-11-08 | Discharge: 2016-11-08 | Disposition: A | Discharge status: Home / Self Care | Payer: Self-pay | Source: Ambulatory Visit | Attending: Cardiology | Admitting: Cardiology

## 2016-11-10 ENCOUNTER — Encounter (HOSPITAL_COMMUNITY)
Admission: RE | Admit: 2016-11-10 | Discharge: 2016-11-10 | Disposition: A | Discharge status: Home / Self Care | Payer: Self-pay | Source: Ambulatory Visit | Attending: Cardiology | Admitting: Cardiology

## 2016-11-15 ENCOUNTER — Encounter (HOSPITAL_COMMUNITY)
Admission: RE | Admit: 2016-11-15 | Discharge: 2016-11-15 | Disposition: A | Discharge status: Home / Self Care | Payer: Self-pay | Source: Ambulatory Visit | Attending: Cardiology | Admitting: Cardiology

## 2016-11-15 DIAGNOSIS — I209 Angina pectoris, unspecified: Secondary | ICD-10-CM | POA: Insufficient documentation

## 2016-11-17 ENCOUNTER — Encounter (HOSPITAL_COMMUNITY)
Admission: RE | Admit: 2016-11-17 | Discharge: 2016-11-17 | Disposition: A | Discharge status: Home / Self Care | Payer: Self-pay | Source: Ambulatory Visit | Attending: Cardiology | Admitting: Cardiology

## 2016-11-20 ENCOUNTER — Encounter (HOSPITAL_COMMUNITY)
Admission: RE | Admit: 2016-11-20 | Discharge: 2016-11-20 | Disposition: A | Discharge status: Home / Self Care | Payer: Self-pay | Source: Ambulatory Visit | Attending: Cardiology | Admitting: Cardiology

## 2016-11-22 ENCOUNTER — Encounter (HOSPITAL_COMMUNITY)
Admission: RE | Admit: 2016-11-22 | Discharge: 2016-11-22 | Disposition: A | Discharge status: Home / Self Care | Payer: Self-pay | Source: Ambulatory Visit | Attending: Cardiology | Admitting: Cardiology

## 2016-11-22 ENCOUNTER — Other Ambulatory Visit (HOSPITAL_COMMUNITY): Payer: Self-pay | Admitting: Interventional Radiology

## 2016-11-22 DIAGNOSIS — IMO0001 Reserved for inherently not codable concepts without codable children: Secondary | ICD-10-CM

## 2016-11-22 DIAGNOSIS — T82330D Leakage of aortic (bifurcation) graft (replacement), subsequent encounter: Secondary | ICD-10-CM

## 2016-11-24 ENCOUNTER — Encounter (HOSPITAL_COMMUNITY): Payer: Self-pay

## 2016-11-27 ENCOUNTER — Encounter (HOSPITAL_COMMUNITY): Payer: Self-pay

## 2016-11-27 ENCOUNTER — Other Ambulatory Visit: Payer: Medicare Other

## 2016-11-27 ENCOUNTER — Ambulatory Visit
Admission: RE | Admit: 2016-11-27 | Discharge: 2016-11-27 | Disposition: A | Discharge status: Home / Self Care | Payer: Medicare Other | Source: Ambulatory Visit | Attending: Surgery | Admitting: Surgery

## 2016-11-27 DIAGNOSIS — I716 Thoracoabdominal aortic aneurysm, without rupture, unspecified: Secondary | ICD-10-CM

## 2016-11-27 DIAGNOSIS — Z48812 Encounter for surgical aftercare following surgery on the circulatory system: Secondary | ICD-10-CM

## 2016-11-27 MED ORDER — IOPAMIDOL (ISOVUE-370) INJECTION 76%
100.0000 mL | Freq: Once | INTRAVENOUS | Status: AC | PRN
  Administered 2016-11-27: 100 mL via INTRAVENOUS

## 2016-11-28 ENCOUNTER — Encounter: Payer: Self-pay | Admitting: Surgery

## 2016-11-29 ENCOUNTER — Encounter (HOSPITAL_COMMUNITY): Payer: Self-pay

## 2016-12-01 ENCOUNTER — Encounter (HOSPITAL_COMMUNITY)
Admission: RE | Admit: 2016-12-01 | Discharge: 2016-12-01 | Disposition: A | Discharge status: Home / Self Care | Payer: Self-pay | Source: Ambulatory Visit | Attending: Cardiology | Admitting: Cardiology

## 2016-12-04 ENCOUNTER — Encounter (HOSPITAL_COMMUNITY)
Admission: RE | Admit: 2016-12-04 | Discharge: 2016-12-04 | Disposition: A | Discharge status: Home / Self Care | Payer: Self-pay | Source: Ambulatory Visit | Attending: Cardiology | Admitting: Cardiology

## 2016-12-04 ENCOUNTER — Ambulatory Visit (INDEPENDENT_AMBULATORY_CARE_PROVIDER_SITE_OTHER): Payer: Medicare Other | Admitting: Surgery

## 2016-12-04 ENCOUNTER — Encounter: Payer: Self-pay | Admitting: Surgery

## 2016-12-04 VITALS — BP 124/75 | HR 68 | Temp 97.1°F | Resp 20 | Ht 70.0 in | Wt 210.0 lb

## 2016-12-04 DIAGNOSIS — I714 Abdominal aortic aneurysm, without rupture, unspecified: Secondary | ICD-10-CM

## 2016-12-04 NOTE — Progress Notes (Signed)
[facility] [place]  Patient name: Garrett Jordan. MRN: [id number] DOB: October 24, 1941 Sex: male   REASON FOR VISIT:    Follow up AAA  HISOTRY OF PRESENT ILLNESS:   The patient is back today for followup of his abdominal aortic aneurysm. He initially underwent coil embolization of his right hypogastric artery. He developed severe buttock claudication therefore his aneurysm repair was delayed. His symptoms of claudication gradually improved and on 08/08/2012 he underwent endovascular repair of his aneurysm. 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  Overall he is doing well and does not have any complaints today.  PAST MEDICAL HISTORY:   Past Medical History:  Diagnosis Date  . AAA (abdominal aortic aneurysm) ([place])   . Arthritis    knees  . Atrial fibrillation ([place])   . CAD (coronary artery disease)   . Cancer [facility])    prostate  . GERD (gastroesophageal reflux disease)   . Gout   . Hyperlipidemia   . Hypertension   . Hypothyroidism   . Kidney stone    right kidney  . Myocardial infarction 01/1991   sp inferior  . Nerve compression    right leg  . Reflux   . Seasonal allergies   . Thyroid disease    hypothyroidism     FAMILY HISTORY:   Family History  Problem Relation Age of Onset  . Heart disease Father     Heart Disease before age 31  . Heart attack Father   . Hyperlipidemia Father   . Hypertension Father   . Stroke Mother   . Deep vein thrombosis Mother   . Heart disease Sister   . Diabetes Sister   . Hyperlipidemia Sister   . Hypertension Sister   . Heart attack Sister   . Heart disease Paternal Uncle   . Diabetes Maternal Grandmother   . Diabetes Sister     SOCIAL HISTORY:   Social History  Substance Use Topics  . Smoking status: Former Smoker    Types: Cigarettes      Quit date: 08/01/1970  . Smokeless tobacco: Never Used  . Alcohol use 3.6 - 5.4 oz/week    4 - 5 Glasses of wine, 2 - 4 Cans of beer per week     Comment: 1-3 glasses 3-4 times a week     ALLERGIES:   Allergies  Allergen Reactions  . Ace Inhibitors Cough  . Crestor [Rosuvastatin] Other (See Comments)    Muscle aches  . Nitroglycerin Other (See Comments)    Very pronounced lowered BP with IV nitro for caths     CURRENT MEDICATIONS:   Current Outpatient Prescriptions  Medication Sig Dispense Refill  . acetaminophen (TYLENOL) 650 MG CR tablet Take 650 mg by mouth daily as needed for pain. Reported on 11/29/2015    . allopurinol (ZYLOPRIM) 300 MG tablet Take 300 mg by mouth every other day. Take in the evening - on even days of the month    . amLODipine (NORVASC) 10 MG tablet Take 10 mg by mouth daily.    Marland Kitchen aspirin EC 81 MG tablet Take 81 mg by mouth at bedtime.     . Cholecalciferol (VITAMIN D3) 2000 UNITS TABS Take 2,000 Units by mouth daily.     . clonazePAM (KLONOPIN) 1 MG tablet Take 0.5 mg by mouth at bedtime.     Marland Kitchen  clopidogrel (PLAVIX) 75 MG tablet Take 75 mg by mouth daily.  12  . Coenzyme Q10 (EQL COQ10) 300 MG CAPS Take by mouth.    . esomeprazole (NEXIUM) 20 MG capsule Take 20 mg by mouth daily at 12 noon.    . fluticasone (FLONASE) 50 MCG/ACT nasal spray Place 2 sprays into both nostrils daily as needed for allergies.     . hydroxypropyl methylcellulose / hypromellose (ISOPTO TEARS / GONIOVISC) 2.5 % ophthalmic solution Place 1 drop into both eyes as needed for dry eyes. Reported on 11/29/2015    . ibuprofen (ADVIL,MOTRIN) 200 MG tablet Take 200 mg by mouth as needed.    Marland Kitchen levothyroxine (SYNTHROID, LEVOTHROID) 137 MCG tablet Take 150 mcg by mouth daily before breakfast.     . loratadine-pseudoephedrine (CLARITIN-D 24-HOUR) 10-240 MG per 24 hr tablet Take 1 tablet by mouth at bedtime.     . metoprolol succinate (TOPROL-XL) 100 MG 24 hr tablet Take 1 tablet (100 mg total)  by mouth daily. Take with or immediately following a meal. 30 tablet 12  . Multiple Vitamin (MULTIVITAMIN WITH MINERALS) TABS Take 1 tablet by mouth at bedtime.     . Multiple Vitamins-Minerals (PRESERVISION AREDS 2) CAPS Take 1 capsule by mouth 2 (two) times daily.    Marland Kitchen ofloxacin (OCUFLOX) 0.3 % ophthalmic solution Place 1 drop into the left eye 2 (two) times daily. Reported on 11/29/2015  1  . olmesartan-hydrochlorothiazide (BENICAR HCT) 40-25 MG per tablet Take 1 tablet by mouth daily.     . Omega-3 Fatty Acids (FISH OIL) 1200 MG CAPS Take 1,200 mg by mouth 2 (two) times daily.    . potassium chloride SA (K-DUR,KLOR-CON) 20 MEQ tablet Take 1 tablet (20 mEq total) by mouth daily. 30 tablet 12  . rosuvastatin (CRESTOR) 10 MG tablet Take 10 mg by mouth as directed. Per patient, takes Mondays & Thursdays    . spironolactone (ALDACTONE) 25 MG tablet Take 12.5 mg by mouth every evening.     . Tamsulosin HCl (FLOMAX) 0.4 MG CAPS Take 0.4 mg by mouth every other day. Take in the evening - on even days of the month    . vitamin B-12 (CYANOCOBALAMIN) 1000 MCG tablet Take 2,500 mcg by mouth at bedtime.     Marland Kitchen diltiazem (CARDIZEM CD) 240 MG 24 hr capsule Take 240 mg by mouth at bedtime. Reported on 11/29/2015    . famotidine (PEPCID) 20 MG tablet Take 20 mg by mouth at bedtime. Reported on 11/29/2015    . Glucose Blood (FREESTYLE TEST VI)     . PROLENSA 0.07 % SOLN Place 1 drop into the left eye daily. Reported on 11/19/2015  1   No current facility-administered medications for this visit.     REVIEW OF SYSTEMS:   [X]  denotes positive finding, [ ]  denotes negative finding Cardiac  Comments:  Chest pain or chest pressure:    Shortness of breath upon exertion:    Short of breath when lying flat:    Irregular heart rhythm:        Vascular    Pain in calf, thigh, or hip brought on by ambulation: x   Pain in feet at night that wakes you up from your sleep:     Blood clot in your veins:    Leg swelling:          Pulmonary    Oxygen at home:    Productive cough:     Wheezing:  Neurologic    Sudden weakness in arms or legs:     Sudden numbness in arms or legs:     Sudden onset of difficulty speaking or slurred speech:    Temporary loss of vision in one eye:     Problems with dizziness:         Gastrointestinal    Blood in stool:     Vomited blood:         Genitourinary    Burning when urinating:     Blood in urine:        Psychiatric    Major depression:         Hematologic    Bleeding problems:    Problems with blood clotting too easily:        Skin    Rashes or ulcers:        Constitutional    Fever or chills:      PHYSICAL EXAM:   Vitals:   12/04/16 1546  BP: 124/75  Pulse: 68  Resp: 20  Temp: 97.1 F (36.2 C)  TempSrc: Oral  SpO2: 95%  Weight: 210 lb (95.3 kg)  Height: 5\' 10"  (1.778 m)    GENERAL: The patient is a well-nourished male, in no acute distress. The vital signs are documented above. CARDIAC: There is a regular rate and rhythm.  PULMONARY: Non-labored respirations MUSCULOSKELETAL: There are no major deformities or cyanosis. NEUROLOGIC: No focal weakness or paresthesias are detected. SKIN: There are no ulcers or rashes noted. PSYCHIATRIC: The patient has a normal affect.  STUDIES:   I have reviewed his CT and exam with the following findings: 1. 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 Maximal dimensions of the penetrating ulcer are now approximately 16 mm compared to 10 mm previously. No evidence of acute hemorrhage. 2. 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 3. Stable aneurysm sac size following prior EVAR and additional transcatheter embolization of type 2 endoleak supplied by the inferior mesenteric artery. 4. No significant endoleak identified. There may be a tiny amount of posterior sac perfusion at the level of  the confluence of L3 lumbar arteries. 5. Stable hepatic steatosis and cholelithiasis. 6. Stable small bilateral inguinal hernias containing fat. 7. Stable nonobstructing bilateral renal calculi.  MEDICAL ISSUES:   AAA: He has a stable aneurysm size today without appreciable endoleak status post coil embolization.  I will follow up with [name] in one year.  He is scheduled to see Dr. Laurence Ferrari within the next few weeks.  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 Prescott Gum later this month   Annamarie Major, MD [facility] [phone] Pager [phone]

## 2016-12-05 ENCOUNTER — Ambulatory Visit
Admission: RE | Admit: 2016-12-05 | Discharge: 2016-12-05 | Disposition: A | Discharge status: Home / Self Care | Payer: Medicare Other | Source: Ambulatory Visit | Attending: Interventional Radiology | Admitting: Interventional Radiology

## 2016-12-05 ENCOUNTER — Encounter: Payer: Self-pay | Admitting: *Deleted

## 2016-12-05 DIAGNOSIS — T82330D Leakage of aortic (bifurcation) graft (replacement), subsequent encounter: Secondary | ICD-10-CM

## 2016-12-05 DIAGNOSIS — IMO0001 Reserved for inherently not codable concepts without codable children: Secondary | ICD-10-CM

## 2016-12-05 HISTORY — PX: IR GENERIC HISTORICAL: IMG1180011

## 2016-12-05 NOTE — Progress Notes (Signed)
Chief Complaint: Patient was seen in follow-up today for  Chief Complaint  Patient presents with  . Follow-up    16 mo follow up Type 2 Endoleak    at the request of McCullough,Heath  Referring Physician(s): Harold Barban  History of Present Illness: Garrett Jordan. is a 76 y.o. male with a history of abdominal aortic aneurysm status post endovascular aortic repair with bifurcated stent graft in September 2013. 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  He underwent endovascular endoleak repair with coil embolization of the flow channel in the aneurysm sac in the origin of the inferior mesenteric artery on 07/16/2015. He presents today for follow-up.  Garrett Jordan is doing clinically well. He is essentially asymptomatic. He occasionally gets twinges of abdominal pain although these are very nonspecific. He is currently battling a hemorrhoid which is his main active complaint. He denies significant changes in bowel or bladder habits. No chest pain, shortness of breath or other systemic symptoms.  Past Medical History:  Diagnosis Date  . AAA (abdominal aortic aneurysm) ([place])   . Arthritis    knees  . Atrial fibrillation ([place])   . CAD (coronary artery disease)   . Cancer [facility])    prostate  . GERD (gastroesophageal reflux disease)   . Gout   . Hyperlipidemia   . Hypertension   . Hypothyroidism   . Kidney stone    right kidney  . Myocardial infarction 01/1991   sp inferior  . Nerve compression    right leg  . Reflux   . Seasonal allergies   . Thyroid disease    hypothyroidism    Past Surgical History:  Procedure Laterality Date  . ABDOMINAL AORTAGRAM N/A 02/28/2012   Procedure: ABDOMINAL Maxcine Ham;  Surgeon: Serafina Mitchell, MD;  Location: [facility] CATH LAB;  Service: Cardiovascular;  Laterality: N/A;  . abdominal aortagram embolization  02/28/2012  .  ABDOMINAL AORTIC ANEURYSM REPAIR  Sept. 2013  . CORONARY ARTERY BYPASS GRAFT  04/2009  . [place] and  2009   RCA  . EMBOLIZATION Right 02/28/2012   Procedure: EMBOLIZATION;  Surgeon: Serafina Mitchell, MD;  Location: [facility] CATH LAB;  Service: Cardiovascular;  Laterality: Right;  . KIDNEY STONE SURGERY    . LEFT HEART CATHETERIZATION WITH CORONARY/GRAFT ANGIOGRAM N/A 01/08/2015   Procedure: LEFT HEART CATHETERIZATION WITH Beatrix Fetters;  Surgeon: Jacolyn Reedy, MD;  Location: [facility] CATH LAB;  Service: Cardiovascular;  Laterality: N/A;  . parathyroid adenoma    . PROSTATE SURGERY     rad seeds  . SPINE SURGERY    . TONSILLECTOMY      Allergies: Ace inhibitors; Crestor [rosuvastatin]; and Nitroglycerin  Medications: Prior to Admission medications   Medication Sig Start Date End Date Taking? Authorizing Provider  acetaminophen (TYLENOL) 650 MG CR tablet Take 650 mg by mouth daily as needed for pain. Reported on 11/29/2015    Historical Provider, MD  allopurinol (ZYLOPRIM) 300 MG tablet Take 300 mg by mouth every other day. Take in the evening - on even days of the month    Historical Provider, MD  amLODipine (NORVASC) 10 MG tablet Take 10 mg by mouth daily.    Historical Provider, MD  aspirin EC 81 MG tablet Take 81 mg by mouth at bedtime.     Historical Provider, MD  Cholecalciferol (VITAMIN D3) 2000 UNITS  TABS Take 2,000 Units by mouth daily.     Historical Provider, MD  clonazePAM (KLONOPIN) 1 MG tablet Take 0.5 mg by mouth at bedtime.     Historical Provider, MD  clopidogrel (PLAVIX) 75 MG tablet Take 75 mg by mouth daily. 03/09/15   Historical Provider, MD  Coenzyme Q10 (EQL COQ10) 300 MG CAPS Take by mouth.    Historical Provider, MD  diltiazem (CARDIZEM CD) 240 MG 24 hr capsule Take 240 mg by mouth at bedtime. Reported on 11/29/2015    Historical Provider, MD  esomeprazole (NEXIUM) 20 MG capsule Take 20 mg by mouth daily at 12 noon.    Historical Provider, MD    famotidine (PEPCID) 20 MG tablet Take 20 mg by mouth at bedtime. Reported on 11/29/2015    Historical Provider, MD  fluticasone (FLONASE) 50 MCG/ACT nasal spray Place 2 sprays into both nostrils daily as needed for allergies.     Historical Provider, MD  Glucose Blood (FREESTYLE TEST VI)  04/03/13   Historical Provider, MD  hydroxypropyl methylcellulose / hypromellose (ISOPTO TEARS / GONIOVISC) 2.5 % ophthalmic solution Place 1 drop into both eyes as needed for dry eyes. Reported on 11/29/2015    Historical Provider, MD  ibuprofen (ADVIL,MOTRIN) 200 MG tablet Take 200 mg by mouth as needed.    Historical Provider, MD  levothyroxine (SYNTHROID, LEVOTHROID) 137 MCG tablet Take 150 mcg by mouth daily before breakfast.     Historical Provider, MD  loratadine-pseudoephedrine (CLARITIN-D 24-HOUR) 10-240 MG per 24 hr tablet Take 1 tablet by mouth at bedtime.     Historical Provider, MD  metoprolol succinate (TOPROL-XL) 100 MG 24 hr tablet Take 1 tablet (100 mg total) by mouth daily. Take with or immediately following a meal. 01/08/15   Jacolyn Reedy, MD  Multiple Vitamin (MULTIVITAMIN WITH MINERALS) TABS Take 1 tablet by mouth at bedtime.     Historical Provider, MD  Multiple Vitamins-Minerals (PRESERVISION AREDS 2) CAPS Take 1 capsule by mouth 2 (two) times daily.    Historical Provider, MD  ofloxacin (OCUFLOX) 0.3 % ophthalmic solution Place 1 drop into the left eye 2 (two) times daily. Reported on 11/29/2015 06/16/15   Historical Provider, MD  olmesartan-hydrochlorothiazide (BENICAR HCT) 40-25 MG per tablet Take 1 tablet by mouth daily.     Historical Provider, MD  Omega-3 Fatty Acids (FISH OIL) 1200 MG CAPS Take 1,200 mg by mouth 2 (two) times daily.    Historical Provider, MD  potassium chloride SA (K-DUR,KLOR-CON) 20 MEQ tablet Take 1 tablet (20 mEq total) by mouth daily. 01/08/15   Jacolyn Reedy, MD  PROLENSA 0.07 % SOLN Place 1 drop into the left eye daily. Reported on 11/19/2015 06/16/15   Historical  Provider, MD  rosuvastatin (CRESTOR) 10 MG tablet Take 10 mg by mouth as directed. Per patient, takes Mondays & Thursdays    Historical Provider, MD  spironolactone (ALDACTONE) 25 MG tablet Take 12.5 mg by mouth every evening.     Historical Provider, MD  Tamsulosin HCl (FLOMAX) 0.4 MG CAPS Take 0.4 mg by mouth every other day. Take in the evening - on even days of the month    Historical Provider, MD  vitamin B-12 (CYANOCOBALAMIN) 1000 MCG tablet Take 2,500 mcg by mouth at bedtime.     Historical Provider, MD     Family History  Problem Relation Age of Onset  . Heart disease Father     Heart Disease before age 100  . Heart attack Father   .  Hyperlipidemia Father   . Hypertension Father   . Stroke Mother   . Deep vein thrombosis Mother   . Heart disease Sister   . Diabetes Sister   . Hyperlipidemia Sister   . Hypertension Sister   . Heart attack Sister   . Heart disease Paternal Uncle   . Diabetes Maternal Grandmother   . Diabetes Sister     Social History   Social History  . Marital status: Divorced    Spouse name: N/A  . Number of children: N/A  . Years of education: N/A   Social History Main Topics  . Smoking status: Former Smoker    Types: Cigarettes    Quit date: 08/01/1970  . Smokeless tobacco: Never Used  . Alcohol use 3.6 - 5.4 oz/week    4 - 5 Glasses of wine, 2 - 4 Cans of beer per week     Comment: 1-3 glasses 3-4 times a week  . Drug use: No  . Sexual activity: Not on file   Other Topics Concern  . Not on file   Social History Narrative  . No narrative on file    Review of Systems: A 12 point ROS discussed and pertinent positives are indicated in the HPI above.  All other systems are negative.  Review of Systems  Vital Signs: BP 131/80 (BP Location: Left Arm, Patient Position: Sitting, Cuff Size: Normal)   Pulse (!) 59   Temp 97.6 F (36.4 C) (Oral)   Resp 15   Ht 5\' 10"  (1.778 m)   Wt 210 lb (95.3 kg)   SpO2 96%   BMI 30.13 kg/m    Physical Exam  Constitutional: He is oriented to person, place, and time. He appears well-developed and well-nourished. No distress.  HENT:  Head: Normocephalic and atraumatic.  Eyes: No scleral icterus.  Cardiovascular: Normal rate and regular rhythm.   Pulmonary/Chest: Effort normal.  Abdominal: Soft. He exhibits no distension. There is no tenderness.  Neurological: He is alert and oriented to person, place, and time.  Skin: Skin is warm and dry.  Psychiatric: He has a normal mood and affect. His behavior is normal.  Nursing note and vitals reviewed.   Imaging: Ct Angio Chest Aorta W/cm &/or Wo/cm  Result Date: 11/27/2016 CLINICAL DATA:  Status post EVAR to treat abdominal aortic aneurysm on 08/08/2012. Additional transcatheter embolization of a type 2 endoleak supplied by the inferior mesenteric artery was performed on 07/16/2015. 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 EXAM: CT ANGIOGRAPHY CHEST, ABDOMEN AND PELVIS TECHNIQUE: Multidetector CT imaging through the chest, abdomen and pelvis was performed using the standard protocol during bolus administration of intravenous contrast. Multiplanar reconstructed images and MIPs were obtained and reviewed to evaluate the vascular anatomy. CONTRAST:  100 mL Isovue 370 IV Creatinine was obtained on site at [place] at [address] Results: Creatinine 1.2 mg/dL.  Estimated GFR of 59 mL/minute. COMPARISON:  10/26/2015 abdomen and pelvic CTA and prior CTA of the chest on 01/23/2011 FINDINGS: CTA CHEST FINDINGS Cardiovascular: Stable mild dilatation of the ascending thoracic aorta measures 4.3 cm in greatest diameter. The aortic root at the level of the sinuses of Valsalva is not dilated. Penetrating ulcer disease is again noted at the level of the aortic isthmus along the lateral aortic arch and undersurface of the arch. Contrast filled penetrating ulcer now measures  approximately 10 x 16 mm and 13 mm in height compared to previous  maximal diameter of approximately 10 mm. No evidence of hemorrhage or dissection. Stable evidence of prior CABG. Proximal great vessels are tortuous and show no significant obstructive disease. Both proximal vertebral arteries are patent with a dominant right vertebral artery. The heart size is within normal limits. No evidence of pericardial fluid. Mediastinum/Nodes: No enlarged mediastinal, hilar, or axillary lymph nodes. Thyroid gland, trachea, and esophagus demonstrate no significant findings. Lungs/Pleura: Lungs are clear. No pleural effusion, pulmonary nodule, infiltrate or pneumothorax. Musculoskeletal: No chest wall abnormality. No acute or significant osseous findings. Review of the MIP images confirms the above findings. CTA ABDOMEN AND PELVIS FINDINGS VASCULAR Aorta: Positioning and patency of a bifurcated endograft with right iliac extension is stable. Stable positioning of embolization coils within the aneurysm sac and extending into the IMA trunk. Aneurysm sac dimensions are stable and approximately 4.7 x 6.1 cm. There is suggestion of a tiny focus of perfusion along the posterior sac margin at the confluence of L3 lumbar arteries. This may result in minimal perfusion of the sac and does not represent a significant endoleak. Celiac: Normally patent. Distal branch vessels shows stable and normal patency with normal branching pattern. SMA: Normally patent. Renals: Bilateral single renal arteries are normally patent. IMA: Previous embolization extends into the IMA trunk. No retrograde perfusion of the aneurysm sac at the level of the IMA. 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 Bilateral common femoral arteries and femoral bifurcations are normally patent. Veins: Delayed imaging demonstrates no venous abnormalities. Review of the MIP images  confirms the above findings. NON-VASCULAR Hepatobiliary: Stable hepatic steatosis and cholelithiasis. No evidence of biliary ductal dilatation. Pancreas: Stable fatty infiltration of the pancreas without evidence of focal lesion. Spleen: Normal in size without focal abnormality. Adrenals/Urinary Tract: Stable nonobstructing bilateral renal calculi without significant change in size or distribution since the most recent scan. No hydronephrosis. Stable right renal cyst. Stomach/Bowel: No evidence of bowel obstruction, lesion or inflammation. Lymphatic: No enlarged lymph nodes identified. Reproductive: Stable appearance of previously placed brachytherapy seeds in the prostate gland. Other: Stable bilateral fat containing inguinal hernias. 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 Review of the MIP images confirms the above findings. IMPRESSION: 1. 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 Maximal dimensions of the penetrating ulcer are now approximately 16 mm compared to 10 mm previously. No evidence of acute hemorrhage. 2. 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 3. Stable aneurysm sac size following prior EVAR and additional transcatheter embolization of type 2 endoleak supplied by the inferior mesenteric artery. 4. No significant endoleak identified. There may be a tiny amount of posterior sac perfusion at the level of the confluence of L3 lumbar arteries. 5. Stable hepatic steatosis and cholelithiasis. 6. Stable small bilateral inguinal hernias containing fat. 7. Stable nonobstructing bilateral renal calculi. Electronically Signed   By: Aletta Edouard M.D.   On: 11/27/2016 15:27   Ct Angio Abd/pel W/ And/or W/o  Result Date: 11/27/2016 CLINICAL DATA:  Status post EVAR to treat abdominal aortic aneurysm on 08/08/2012. Additional transcatheter embolization  of a type 2 endoleak supplied by the inferior mesenteric artery was performed on 07/16/2015. 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 EXAM: CT ANGIOGRAPHY CHEST, ABDOMEN AND PELVIS TECHNIQUE: Multidetector CT imaging  through the chest, abdomen and pelvis was performed using the standard protocol during bolus administration of intravenous contrast. Multiplanar reconstructed images and MIPs were obtained and reviewed to evaluate the vascular anatomy. CONTRAST:  100 mL Isovue 370 IV Creatinine was obtained on site at [place] at [address] Results: Creatinine 1.2 mg/dL.  Estimated GFR of 59 mL/minute. COMPARISON:  10/26/2015 abdomen and pelvic CTA and prior CTA of the chest on 01/23/2011 FINDINGS: CTA CHEST FINDINGS Cardiovascular: Stable mild dilatation of the ascending thoracic aorta measures 4.3 cm in greatest diameter. The aortic root at the level of the sinuses of Valsalva is not dilated. Penetrating ulcer disease is again noted at the level of the aortic isthmus along the lateral aortic arch and undersurface of the arch. Contrast filled penetrating ulcer now measures approximately 10 x 16 mm and 13 mm in height compared to previous maximal diameter of approximately 10 mm. No evidence of hemorrhage or dissection. Stable evidence of prior CABG. Proximal great vessels are tortuous and show no significant obstructive disease. Both proximal vertebral arteries are patent with a dominant right vertebral artery. The heart size is within normal limits. No evidence of pericardial fluid. Mediastinum/Nodes: No enlarged mediastinal, hilar, or axillary lymph nodes. Thyroid gland, trachea, and esophagus demonstrate no significant findings. Lungs/Pleura: Lungs are clear. No pleural effusion, pulmonary nodule, infiltrate or pneumothorax. Musculoskeletal: No chest wall abnormality. No acute or significant osseous findings. Review of the MIP  images confirms the above findings. CTA ABDOMEN AND PELVIS FINDINGS VASCULAR Aorta: Positioning and patency of a bifurcated endograft with right iliac extension is stable. Stable positioning of embolization coils within the aneurysm sac and extending into the IMA trunk. Aneurysm sac dimensions are stable and approximately 4.7 x 6.1 cm. There is suggestion of a tiny focus of perfusion along the posterior sac margin at the confluence of L3 lumbar arteries. This may result in minimal perfusion of the sac and does not represent a significant endoleak. Celiac: Normally patent. Distal branch vessels shows stable and normal patency with normal branching pattern. SMA: Normally patent. Renals: Bilateral single renal arteries are normally patent. IMA: Previous embolization extends into the IMA trunk. No retrograde perfusion of the aneurysm sac at the level of the IMA. 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 Bilateral common femoral arteries and femoral bifurcations are normally patent. Veins: Delayed imaging demonstrates no venous abnormalities. Review of the MIP images confirms the above findings. NON-VASCULAR Hepatobiliary: Stable hepatic steatosis and cholelithiasis. No evidence of biliary ductal dilatation. Pancreas: Stable fatty infiltration of the pancreas without evidence of focal lesion. Spleen: Normal in size without focal abnormality. Adrenals/Urinary Tract: Stable nonobstructing bilateral renal calculi without significant change in size or distribution since the most recent scan. No hydronephrosis. Stable right renal cyst. Stomach/Bowel: No evidence of bowel obstruction, lesion or inflammation. Lymphatic: No enlarged lymph nodes identified. Reproductive: Stable appearance of previously placed brachytherapy seeds in the prostate gland. Other: Stable bilateral fat containing inguinal hernias. Musculoskeletal: Stable  degenerative disc disease of the lumbar spine at L3-4 and L4-5. Review of the MIP images confirms the above findings. IMPRESSION: 1. 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 Maximal dimensions of the penetrating ulcer are now approximately 16 mm compared to 10 mm previously. No evidence of acute hemorrhage. 2. 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 3. Stable aneurysm sac size following prior EVAR and additional transcatheter embolization of type 2 endoleak supplied by the inferior mesenteric artery. 4. No significant endoleak identified. There may be a tiny amount of posterior sac perfusion at the level of the confluence of L3 lumbar arteries. 5. Stable hepatic steatosis and cholelithiasis. 6. Stable small bilateral inguinal hernias containing fat. 7. Stable nonobstructing bilateral renal calculi. Electronically Signed   By: Aletta Edouard M.D.   On: 11/27/2016 15:27    Labs:  CBC: No results for input(s): WBC, HGB, HCT, PLT in the last 8760 hours.  COAGS: No results for input(s): INR, APTT in the last 8760 hours.  BMP: No results for input(s): NA, K, CL, CO2, GLUCOSE, BUN, CALCIUM, CREATININE, GFRNONAA, GFRAA in the last 8760 hours.  Invalid input(s): CMP  LIVER FUNCTION TESTS: No results for input(s): BILITOT, AST, ALT, ALKPHOS, PROT, ALBUMIN in the last 8760 hours.  TUMOR MARKERS: No results for input(s): AFPTM, CEA, CA199, CHROMGRNA in the last 8760 hours.  Assessment and Plan:  Doing very well 16 months status post endovascular repair of type II endoleak. Recent CT imaging demonstrates no appreciable endoleak and a stable aneurysm sac diameter consistent with a successful type II endoleak repair.  Of note, he does have a penetrating ulcer arising from the transverse thoracic aorta which is slightly enlarged compared to 2012. Notably, he denies chest pain at this time. He  has a follow-up appointment with Dr. Prescott Gum in the near future. I went over his imaging in detail with him today and answered all of his questions.  At this point, I am available on an as-needed basis for this gentleman. He will have continued annual follow-up evaluations with Dr. Trula Slade to monitor his EVAR. I will follow along and review his scans annually to make sure the endoleak remains resolved. If there are any changes, we will certainly bring him back in for another appointment.  I'll have my office staff set a reminder to check his chart for annual follow-up CTAs in January or February of each year.    Electronically Signed: Jacqulynn Cadet 12/05/2016, 9:35 AM   I spent a total of  15 Minutes in face to face in clinical consultation, greater than 50% of which was counseling/coordinating care for endoleak.

## 2016-12-06 ENCOUNTER — Encounter (HOSPITAL_COMMUNITY)
Admission: RE | Admit: 2016-12-06 | Discharge: 2016-12-06 | Disposition: A | Discharge status: Home / Self Care | Payer: Self-pay | Source: Ambulatory Visit | Attending: Cardiology | Admitting: Cardiology

## 2016-12-08 ENCOUNTER — Encounter (HOSPITAL_COMMUNITY)
Admission: RE | Admit: 2016-12-08 | Discharge: 2016-12-08 | Disposition: A | Discharge status: Home / Self Care | Payer: Self-pay | Source: Ambulatory Visit | Attending: Cardiology | Admitting: Cardiology

## 2016-12-11 ENCOUNTER — Encounter (HOSPITAL_COMMUNITY)
Admission: RE | Admit: 2016-12-11 | Discharge: 2016-12-11 | Disposition: A | Discharge status: Home / Self Care | Payer: Self-pay | Source: Ambulatory Visit | Attending: Cardiology | Admitting: Cardiology

## 2016-12-13 ENCOUNTER — Ambulatory Visit: Payer: Medicare Other | Admitting: Cardiothoracic Surgery

## 2016-12-13 ENCOUNTER — Encounter (HOSPITAL_COMMUNITY)
Admission: RE | Admit: 2016-12-13 | Discharge: 2016-12-13 | Disposition: A | Discharge status: Home / Self Care | Payer: Self-pay | Source: Ambulatory Visit | Attending: Cardiology | Admitting: Cardiology

## 2016-12-15 ENCOUNTER — Encounter (HOSPITAL_COMMUNITY)
Admission: RE | Admit: 2016-12-15 | Discharge: 2016-12-15 | Disposition: A | Discharge status: Home / Self Care | Payer: Self-pay | Source: Ambulatory Visit | Attending: Cardiology | Admitting: Cardiology

## 2016-12-15 DIAGNOSIS — I209 Angina pectoris, unspecified: Secondary | ICD-10-CM | POA: Insufficient documentation

## 2016-12-18 ENCOUNTER — Encounter (HOSPITAL_COMMUNITY)
Admission: RE | Admit: 2016-12-18 | Discharge: 2016-12-18 | Disposition: A | Discharge status: Home / Self Care | Payer: Self-pay | Source: Ambulatory Visit | Attending: Cardiology | Admitting: Cardiology

## 2016-12-20 ENCOUNTER — Encounter (HOSPITAL_COMMUNITY)
Admission: RE | Admit: 2016-12-20 | Discharge: 2016-12-20 | Disposition: A | Discharge status: Home / Self Care | Payer: Self-pay | Source: Ambulatory Visit | Attending: Cardiology | Admitting: Cardiology

## 2016-12-22 ENCOUNTER — Encounter (HOSPITAL_COMMUNITY)
Admission: RE | Admit: 2016-12-22 | Discharge: 2016-12-22 | Disposition: A | Discharge status: Home / Self Care | Payer: Self-pay | Source: Ambulatory Visit | Attending: Cardiology | Admitting: Cardiology

## 2016-12-25 ENCOUNTER — Encounter (HOSPITAL_COMMUNITY)
Admission: RE | Admit: 2016-12-25 | Discharge: 2016-12-25 | Disposition: A | Discharge status: Home / Self Care | Payer: Self-pay | Source: Ambulatory Visit | Attending: Cardiology | Admitting: Cardiology

## 2016-12-27 ENCOUNTER — Ambulatory Visit (INDEPENDENT_AMBULATORY_CARE_PROVIDER_SITE_OTHER): Payer: Medicare Other | Admitting: Cardiothoracic Surgery

## 2016-12-27 ENCOUNTER — Encounter (HOSPITAL_COMMUNITY)
Admission: RE | Admit: 2016-12-27 | Discharge: 2016-12-27 | Disposition: A | Discharge status: Home / Self Care | Payer: Self-pay | Source: Ambulatory Visit | Attending: Cardiology | Admitting: Cardiology

## 2016-12-27 VITALS — BP 119/78 | HR 68 | Resp 20 | Ht 70.0 in | Wt 210.0 lb

## 2016-12-27 DIAGNOSIS — I7121 Aneurysm of the ascending aorta, without rupture: Secondary | ICD-10-CM

## 2016-12-27 DIAGNOSIS — I712 Thoracic aortic aneurysm, without rupture: Secondary | ICD-10-CM

## 2016-12-27 NOTE — Progress Notes (Signed)
PCP is Gara Kroner, MD Referring Provider is Serafina Mitchell, MD  Chief Complaint  Patient presents with  . Thoracic Aortic Aneurysm    Surgical eval, CTA Chest/ABD/Pelvis 11/27/16- Dr.Brabham    HPI: The patient presents for evaluation of atherosclerotic disease of his distal aortic arch and proximal descending thoracic aorta. 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 In 2010 the patient had CABG 5 including left IMA graft to his LAD. He has had no recurrent angina and he states a recent assessment of his grafts by his cardiologist showed no problems.  The patient is having regular CT scans after a EVAR treatment of his AAA. The patient has had atherosclerotic changes in the wall the aorta and this area for a few years but the ulceration is slightly progressed. There is no pain or symptoms. The patient is taking aspirin Plavix Crestor and blood pressure medication. My recommendation is to continue medical therapy, follow this area of his thoracic aorta with his schedule scans for his EVAR. Only if this became significantly better would I recommend a TEVAR approach.  Past Medical History:  Diagnosis Date  . AAA (abdominal aortic aneurysm) ([place])   . Arthritis    knees  . Atrial fibrillation ([place])   . CAD (coronary artery disease)   . Cancer [facility])    prostate  . GERD (gastroesophageal reflux disease)   . Gout   . Hyperlipidemia   . Hypertension   . Hypothyroidism   . Kidney stone    right kidney  . Myocardial infarction 01/1991   sp inferior  . Nerve compression    right leg  . Reflux   . Seasonal allergies   . Thyroid disease    hypothyroidism    Past Surgical History:  Procedure Laterality Date  . ABDOMINAL AORTAGRAM N/A 02/28/2012   Procedure: ABDOMINAL Maxcine Ham;  Surgeon: Serafina Mitchell, MD;  Location: [facility] CATH LAB;  Service:  Cardiovascular;  Laterality: N/A;  . abdominal aortagram embolization  02/28/2012  . ABDOMINAL AORTIC ANEURYSM REPAIR  Sept. 2013  . CORONARY ARTERY BYPASS GRAFT  04/2009  . [place] and  2009   RCA  . EMBOLIZATION Right 02/28/2012   Procedure: EMBOLIZATION;  Surgeon: Serafina Mitchell, MD;  Location: [facility] CATH LAB;  Service: Cardiovascular;  Laterality: Right;  . IR GENERIC HISTORICAL  12/05/2016   IR RADIOLOGIST EVAL & MGMT 12/05/2016 Jacqulynn Cadet, MD [facility] INTERV RAD  . KIDNEY STONE SURGERY    . LEFT HEART CATHETERIZATION WITH CORONARY/GRAFT ANGIOGRAM N/A 01/08/2015   Procedure: LEFT HEART CATHETERIZATION WITH Beatrix Fetters;  Surgeon: Jacolyn Reedy, MD;  Location: [facility] CATH LAB;  Service: Cardiovascular;  Laterality: N/A;  . parathyroid adenoma    . PROSTATE SURGERY     rad seeds  . SPINE SURGERY    . TONSILLECTOMY      Family History  Problem Relation Age of Onset  . Heart disease Father     Heart Disease before age 52  . Heart attack Father   . Hyperlipidemia Father   . Hypertension Father   . Stroke Mother   . Deep vein thrombosis Mother   . Heart disease Sister   . Diabetes Sister   . Hyperlipidemia Sister   . Hypertension Sister   . Heart attack Sister   . Heart disease Paternal Uncle   . Diabetes  Maternal Grandmother   . Diabetes Sister     Social History Social History  Substance Use Topics  . Smoking status: Former Smoker    Types: Cigarettes    Quit date: 08/01/1970  . Smokeless tobacco: Never Used  . Alcohol use 3.6 - 5.4 oz/week    4 - 5 Glasses of wine, 2 - 4 Cans of beer per week     Comment: 1-3 glasses 3-4 times a week    Current Outpatient Prescriptions  Medication Sig Dispense Refill  . acetaminophen (TYLENOL) 650 MG CR tablet Take 650 mg by mouth daily as needed for pain. Reported on 11/29/2015    . allopurinol (ZYLOPRIM) 300 MG tablet Take 300 mg by mouth every other day. Take in the evening - on even days of the  month    . amLODipine (NORVASC) 10 MG tablet Take 10 mg by mouth daily.    Marland Kitchen aspirin EC 81 MG tablet Take 81 mg by mouth at bedtime.     . Cholecalciferol (VITAMIN D3) 2000 UNITS TABS Take 2,000 Units by mouth daily.     . clonazePAM (KLONOPIN) 1 MG tablet Take 0.5 mg by mouth at bedtime.     . clopidogrel (PLAVIX) 75 MG tablet Take 75 mg by mouth daily.  12  . Coenzyme Q10 (EQL COQ10) 300 MG CAPS Take by mouth.    . esomeprazole (NEXIUM) 20 MG capsule Take 20 mg by mouth daily at 12 noon.    . famotidine (PEPCID) 20 MG tablet Take 20 mg by mouth at bedtime. Reported on 11/29/2015    . fluticasone (FLONASE) 50 MCG/ACT nasal spray Place 2 sprays into both nostrils daily as needed for allergies.     . Glucose Blood (FREESTYLE TEST VI)     . hydroxypropyl methylcellulose / hypromellose (ISOPTO TEARS / GONIOVISC) 2.5 % ophthalmic solution Place 1 drop into both eyes as needed for dry eyes. Reported on 11/29/2015    . ibuprofen (ADVIL,MOTRIN) 200 MG tablet Take 200 mg by mouth as needed.    Marland Kitchen levothyroxine (SYNTHROID, LEVOTHROID) 137 MCG tablet Take 150 mcg by mouth daily before breakfast.     . loratadine-pseudoephedrine (CLARITIN-D 24-HOUR) 10-240 MG per 24 hr tablet Take 1 tablet by mouth at bedtime.     . metoprolol succinate (TOPROL-XL) 100 MG 24 hr tablet Take 1 tablet (100 mg total) by mouth daily. Take with or immediately following a meal. 30 tablet 12  . Multiple Vitamin (MULTIVITAMIN WITH MINERALS) TABS Take 1 tablet by mouth at bedtime.     . Multiple Vitamins-Minerals (PRESERVISION AREDS 2) CAPS Take 1 capsule by mouth 2 (two) times daily.    Marland Kitchen olmesartan-hydrochlorothiazide (BENICAR HCT) 40-25 MG per tablet Take 1 tablet by mouth daily.     . Omega-3 Fatty Acids (FISH OIL) 1200 MG CAPS Take 1,200 mg by mouth 2 (two) times daily.    . potassium chloride SA (K-DUR,KLOR-CON) 20 MEQ tablet Take 1 tablet (20 mEq total) by mouth daily. 30 tablet 12  . rosuvastatin (CRESTOR) 10 MG tablet Take 10  mg by mouth as directed. Per patient, takes Mondays & Thursdays    . spironolactone (ALDACTONE) 25 MG tablet Take 12.5 mg by mouth every evening.     . Tamsulosin HCl (FLOMAX) 0.4 MG CAPS Take 0.4 mg by mouth every other day. Take in the evening - on even days of the month    . vitamin B-12 (CYANOCOBALAMIN) 1000 MCG tablet Take 2,500 mcg by mouth at  bedtime.      No current facility-administered medications for this visit.     Allergies  Allergen Reactions  . Ace Inhibitors Cough  . Crestor [Rosuvastatin] Other (See Comments)    Muscle aches  . Nitroglycerin Other (See Comments)    Very pronounced lowered BP with IV nitro for caths    Review of Systems  Going to cardiac rehabilitation maintenance program Overall feels well No chest pain peripheral ankle edema No bleeding problems from his Plavix  BP 119/78   Pulse 68   Resp 20   Ht 5\' 10"  (1.778 m)   Wt 210 lb (95.3 kg)   SpO2 96% Comment: RA  BMI 30.13 kg/m  Physical Exam      Exam    General- alert and comfortable   Lungs- clear without rales, wheezes   Cor- regular rate and rhythm, no murmur , gallop   Abdomen- soft, non-tender   Extremities - warm, non-tender, minimal edema   Neuro- oriented, appropriate, no focal weakness   Diagnostic Tests: CTA personally reviewed and counseled with patient showing the slight increase in the atherosclerotic disease of the thoracic aortic wall of the distal arch  Impression: Continue medical therapy Continue with surveillance scans  Plan: Return in one year to reviewed his scan taken for his EVAR  Len Childs, MD [facility] [phone]

## 2016-12-29 ENCOUNTER — Encounter (HOSPITAL_COMMUNITY): Payer: Self-pay

## 2017-01-01 ENCOUNTER — Encounter (HOSPITAL_COMMUNITY): Payer: Self-pay

## 2017-01-03 ENCOUNTER — Encounter (HOSPITAL_COMMUNITY)
Admission: RE | Admit: 2017-01-03 | Discharge: 2017-01-03 | Disposition: A | Discharge status: Home / Self Care | Payer: Self-pay | Source: Ambulatory Visit | Attending: Cardiology | Admitting: Cardiology

## 2017-01-05 ENCOUNTER — Encounter (HOSPITAL_COMMUNITY)
Admission: RE | Admit: 2017-01-05 | Discharge: 2017-01-05 | Disposition: A | Discharge status: Home / Self Care | Payer: Self-pay | Source: Ambulatory Visit | Attending: Cardiology | Admitting: Cardiology

## 2017-01-08 ENCOUNTER — Encounter (HOSPITAL_COMMUNITY)
Admission: RE | Admit: 2017-01-08 | Discharge: 2017-01-08 | Disposition: A | Discharge status: Home / Self Care | Payer: Self-pay | Source: Ambulatory Visit | Attending: Cardiology | Admitting: Cardiology

## 2017-01-10 ENCOUNTER — Encounter (HOSPITAL_COMMUNITY)
Admission: RE | Admit: 2017-01-10 | Discharge: 2017-01-10 | Disposition: A | Discharge status: Home / Self Care | Payer: Self-pay | Source: Ambulatory Visit | Attending: Cardiology | Admitting: Cardiology

## 2017-01-12 ENCOUNTER — Encounter (HOSPITAL_COMMUNITY)
Admission: RE | Admit: 2017-01-12 | Discharge: 2017-01-12 | Disposition: A | Discharge status: Home / Self Care | Payer: Self-pay | Source: Ambulatory Visit | Attending: Cardiology | Admitting: Cardiology

## 2017-01-12 DIAGNOSIS — I209 Angina pectoris, unspecified: Secondary | ICD-10-CM | POA: Insufficient documentation

## 2017-01-15 ENCOUNTER — Encounter (HOSPITAL_COMMUNITY)
Admission: RE | Admit: 2017-01-15 | Discharge: 2017-01-15 | Disposition: A | Discharge status: Home / Self Care | Payer: Self-pay | Source: Ambulatory Visit | Attending: Cardiology | Admitting: Cardiology

## 2017-01-17 ENCOUNTER — Encounter (HOSPITAL_COMMUNITY)
Admission: RE | Admit: 2017-01-17 | Discharge: 2017-01-17 | Disposition: A | Discharge status: Home / Self Care | Payer: Self-pay | Source: Ambulatory Visit | Attending: Cardiology | Admitting: Cardiology

## 2017-01-19 ENCOUNTER — Encounter (HOSPITAL_COMMUNITY)
Admission: RE | Admit: 2017-01-19 | Discharge: 2017-01-19 | Disposition: A | Discharge status: Home / Self Care | Payer: Self-pay | Source: Ambulatory Visit | Attending: Cardiology | Admitting: Cardiology

## 2017-01-22 ENCOUNTER — Encounter (HOSPITAL_COMMUNITY): Payer: Self-pay

## 2017-01-24 ENCOUNTER — Encounter (HOSPITAL_COMMUNITY)
Admission: RE | Admit: 2017-01-24 | Discharge: 2017-01-24 | Disposition: A | Discharge status: Home / Self Care | Payer: Self-pay | Source: Ambulatory Visit | Attending: Cardiology | Admitting: Cardiology

## 2017-01-26 ENCOUNTER — Encounter (HOSPITAL_COMMUNITY)
Admission: RE | Admit: 2017-01-26 | Discharge: 2017-01-26 | Disposition: A | Discharge status: Home / Self Care | Payer: Self-pay | Source: Ambulatory Visit | Attending: Cardiology | Admitting: Cardiology

## 2017-01-29 ENCOUNTER — Encounter (HOSPITAL_COMMUNITY)
Admission: RE | Admit: 2017-01-29 | Discharge: 2017-01-29 | Disposition: A | Discharge status: Home / Self Care | Payer: Self-pay | Source: Ambulatory Visit | Attending: Cardiology | Admitting: Cardiology

## 2017-01-31 ENCOUNTER — Encounter (HOSPITAL_COMMUNITY)
Admission: RE | Admit: 2017-01-31 | Discharge: 2017-01-31 | Disposition: A | Discharge status: Home / Self Care | Payer: Self-pay | Source: Ambulatory Visit | Attending: Cardiology | Admitting: Cardiology

## 2017-02-02 ENCOUNTER — Encounter (HOSPITAL_COMMUNITY): Payer: Self-pay

## 2017-02-05 ENCOUNTER — Encounter (HOSPITAL_COMMUNITY)
Admission: RE | Admit: 2017-02-05 | Discharge: 2017-02-05 | Disposition: A | Discharge status: Home / Self Care | Payer: Self-pay | Source: Ambulatory Visit | Attending: Cardiology | Admitting: Cardiology

## 2017-02-07 ENCOUNTER — Encounter (HOSPITAL_COMMUNITY)
Admission: RE | Admit: 2017-02-07 | Discharge: 2017-02-07 | Disposition: A | Discharge status: Home / Self Care | Payer: Self-pay | Source: Ambulatory Visit | Attending: Cardiology | Admitting: Cardiology

## 2017-02-09 ENCOUNTER — Encounter (HOSPITAL_COMMUNITY): Payer: Self-pay

## 2017-02-12 ENCOUNTER — Encounter (HOSPITAL_COMMUNITY)
Admission: RE | Admit: 2017-02-12 | Discharge: 2017-02-12 | Disposition: A | Discharge status: Home / Self Care | Payer: Self-pay | Source: Ambulatory Visit | Attending: Cardiology | Admitting: Cardiology

## 2017-02-12 DIAGNOSIS — I209 Angina pectoris, unspecified: Secondary | ICD-10-CM | POA: Insufficient documentation

## 2017-02-14 ENCOUNTER — Encounter (HOSPITAL_COMMUNITY)
Admission: RE | Admit: 2017-02-14 | Discharge: 2017-02-14 | Disposition: A | Discharge status: Home / Self Care | Payer: Self-pay | Source: Ambulatory Visit | Attending: Cardiology | Admitting: Cardiology

## 2017-02-16 ENCOUNTER — Encounter (HOSPITAL_COMMUNITY): Payer: Self-pay

## 2017-02-19 ENCOUNTER — Encounter (HOSPITAL_COMMUNITY)
Admission: RE | Admit: 2017-02-19 | Discharge: 2017-02-19 | Disposition: A | Discharge status: Home / Self Care | Payer: Self-pay | Source: Ambulatory Visit | Attending: Cardiology | Admitting: Cardiology

## 2017-02-21 ENCOUNTER — Encounter (HOSPITAL_COMMUNITY)
Admission: RE | Admit: 2017-02-21 | Discharge: 2017-02-21 | Disposition: A | Discharge status: Home / Self Care | Payer: Self-pay | Source: Ambulatory Visit | Attending: Cardiology | Admitting: Cardiology

## 2017-02-23 ENCOUNTER — Encounter (HOSPITAL_COMMUNITY): Payer: Self-pay

## 2017-02-26 ENCOUNTER — Encounter (HOSPITAL_COMMUNITY)
Admission: RE | Admit: 2017-02-26 | Discharge: 2017-02-26 | Disposition: A | Discharge status: Home / Self Care | Payer: Self-pay | Source: Ambulatory Visit | Attending: Cardiology | Admitting: Cardiology

## 2017-02-28 ENCOUNTER — Encounter (HOSPITAL_COMMUNITY)
Admission: RE | Admit: 2017-02-28 | Discharge: 2017-02-28 | Disposition: A | Discharge status: Home / Self Care | Payer: Self-pay | Source: Ambulatory Visit | Attending: Cardiology | Admitting: Cardiology

## 2017-03-02 ENCOUNTER — Encounter (HOSPITAL_COMMUNITY)
Admission: RE | Admit: 2017-03-02 | Discharge: 2017-03-02 | Disposition: A | Discharge status: Home / Self Care | Payer: Self-pay | Source: Ambulatory Visit | Attending: Cardiology | Admitting: Cardiology

## 2017-03-05 ENCOUNTER — Encounter (HOSPITAL_COMMUNITY)
Admission: RE | Admit: 2017-03-05 | Discharge: 2017-03-05 | Disposition: A | Discharge status: Home / Self Care | Payer: Self-pay | Source: Ambulatory Visit | Attending: Cardiology | Admitting: Cardiology

## 2017-03-07 ENCOUNTER — Encounter (HOSPITAL_COMMUNITY)
Admission: RE | Admit: 2017-03-07 | Discharge: 2017-03-07 | Disposition: A | Discharge status: Home / Self Care | Payer: Self-pay | Source: Ambulatory Visit | Attending: Cardiology | Admitting: Cardiology

## 2017-03-09 ENCOUNTER — Encounter (HOSPITAL_COMMUNITY): Payer: Self-pay

## 2017-03-12 ENCOUNTER — Encounter (HOSPITAL_COMMUNITY)
Admission: RE | Admit: 2017-03-12 | Discharge: 2017-03-12 | Disposition: A | Discharge status: Home / Self Care | Payer: Self-pay | Source: Ambulatory Visit | Attending: Cardiology | Admitting: Cardiology

## 2017-03-14 ENCOUNTER — Encounter (HOSPITAL_COMMUNITY)
Admission: RE | Admit: 2017-03-14 | Discharge: 2017-03-14 | Disposition: A | Discharge status: Home / Self Care | Payer: Self-pay | Source: Ambulatory Visit | Attending: Cardiology | Admitting: Cardiology

## 2017-03-14 DIAGNOSIS — I712 Thoracic aortic aneurysm, without rupture: Secondary | ICD-10-CM | POA: Insufficient documentation

## 2017-03-16 ENCOUNTER — Encounter (HOSPITAL_COMMUNITY): Payer: Self-pay

## 2017-03-19 ENCOUNTER — Encounter (HOSPITAL_COMMUNITY)
Admission: RE | Admit: 2017-03-19 | Discharge: 2017-03-19 | Disposition: A | Discharge status: Home / Self Care | Payer: Self-pay | Source: Ambulatory Visit | Attending: Cardiology | Admitting: Cardiology

## 2017-03-21 ENCOUNTER — Encounter (HOSPITAL_COMMUNITY)
Admission: RE | Admit: 2017-03-21 | Discharge: 2017-03-21 | Disposition: A | Discharge status: Home / Self Care | Payer: Self-pay | Source: Ambulatory Visit | Attending: Cardiology | Admitting: Cardiology

## 2017-03-23 ENCOUNTER — Encounter (HOSPITAL_COMMUNITY): Payer: Self-pay

## 2017-03-26 ENCOUNTER — Encounter (HOSPITAL_COMMUNITY)
Admission: RE | Admit: 2017-03-26 | Discharge: 2017-03-26 | Disposition: A | Discharge status: Home / Self Care | Payer: Self-pay | Source: Ambulatory Visit | Attending: Cardiology | Admitting: Cardiology

## 2017-03-28 ENCOUNTER — Encounter (HOSPITAL_COMMUNITY)
Admission: RE | Admit: 2017-03-28 | Discharge: 2017-03-28 | Disposition: A | Discharge status: Home / Self Care | Payer: Self-pay | Source: Ambulatory Visit | Attending: Cardiology | Admitting: Cardiology

## 2017-03-30 ENCOUNTER — Encounter (HOSPITAL_COMMUNITY): Payer: Self-pay

## 2017-04-02 ENCOUNTER — Encounter (HOSPITAL_COMMUNITY)
Admission: RE | Admit: 2017-04-02 | Discharge: 2017-04-02 | Disposition: A | Discharge status: Home / Self Care | Payer: Self-pay | Source: Ambulatory Visit | Attending: Cardiology | Admitting: Cardiology

## 2017-04-04 ENCOUNTER — Encounter (HOSPITAL_COMMUNITY)
Admission: RE | Admit: 2017-04-04 | Discharge: 2017-04-04 | Disposition: A | Discharge status: Home / Self Care | Payer: Self-pay | Source: Ambulatory Visit | Attending: Cardiology | Admitting: Cardiology

## 2017-04-06 ENCOUNTER — Encounter (HOSPITAL_COMMUNITY): Payer: Self-pay

## 2017-04-11 ENCOUNTER — Encounter (HOSPITAL_COMMUNITY)
Admission: RE | Admit: 2017-04-11 | Discharge: 2017-04-11 | Disposition: A | Discharge status: Home / Self Care | Payer: Self-pay | Source: Ambulatory Visit | Attending: Cardiology | Admitting: Cardiology

## 2017-04-13 ENCOUNTER — Encounter (HOSPITAL_COMMUNITY)
Admission: RE | Admit: 2017-04-13 | Discharge: 2017-04-13 | Disposition: A | Discharge status: Home / Self Care | Payer: Self-pay | Source: Ambulatory Visit | Attending: Cardiology | Admitting: Cardiology

## 2017-04-13 DIAGNOSIS — I712 Thoracic aortic aneurysm, without rupture: Secondary | ICD-10-CM | POA: Insufficient documentation

## 2017-04-16 ENCOUNTER — Encounter (HOSPITAL_COMMUNITY)
Admission: RE | Admit: 2017-04-16 | Discharge: 2017-04-16 | Disposition: A | Discharge status: Home / Self Care | Payer: Self-pay | Source: Ambulatory Visit | Attending: Cardiology | Admitting: Cardiology

## 2017-04-18 ENCOUNTER — Encounter (HOSPITAL_COMMUNITY)
Admission: RE | Admit: 2017-04-18 | Discharge: 2017-04-18 | Disposition: A | Discharge status: Home / Self Care | Payer: Self-pay | Source: Ambulatory Visit | Attending: Cardiology | Admitting: Cardiology

## 2017-04-20 ENCOUNTER — Encounter (HOSPITAL_COMMUNITY): Payer: Self-pay

## 2017-04-23 ENCOUNTER — Encounter (HOSPITAL_COMMUNITY)
Admission: RE | Admit: 2017-04-23 | Discharge: 2017-04-23 | Disposition: A | Discharge status: Home / Self Care | Payer: Self-pay | Source: Ambulatory Visit | Attending: Cardiology | Admitting: Cardiology

## 2017-04-25 ENCOUNTER — Encounter (HOSPITAL_COMMUNITY)
Admission: RE | Admit: 2017-04-25 | Discharge: 2017-04-25 | Disposition: A | Discharge status: Home / Self Care | Payer: Self-pay | Source: Ambulatory Visit | Attending: Cardiology | Admitting: Cardiology

## 2017-04-27 ENCOUNTER — Encounter (HOSPITAL_COMMUNITY): Payer: Self-pay

## 2017-04-30 ENCOUNTER — Encounter (HOSPITAL_COMMUNITY)
Admission: RE | Admit: 2017-04-30 | Discharge: 2017-04-30 | Disposition: A | Discharge status: Home / Self Care | Payer: Self-pay | Source: Ambulatory Visit | Attending: Cardiology | Admitting: Cardiology

## 2017-05-02 ENCOUNTER — Encounter (HOSPITAL_COMMUNITY)
Admission: RE | Admit: 2017-05-02 | Discharge: 2017-05-02 | Disposition: A | Discharge status: Home / Self Care | Payer: Self-pay | Source: Ambulatory Visit | Attending: Cardiology | Admitting: Cardiology

## 2017-05-04 ENCOUNTER — Encounter (HOSPITAL_COMMUNITY): Payer: Self-pay

## 2017-05-07 ENCOUNTER — Encounter (HOSPITAL_COMMUNITY): Payer: Self-pay

## 2017-05-09 ENCOUNTER — Encounter (HOSPITAL_COMMUNITY)
Admission: RE | Admit: 2017-05-09 | Discharge: 2017-05-09 | Disposition: A | Discharge status: Home / Self Care | Payer: Self-pay | Source: Ambulatory Visit | Attending: Cardiology | Admitting: Cardiology

## 2017-05-11 ENCOUNTER — Encounter (HOSPITAL_COMMUNITY)
Admission: RE | Admit: 2017-05-11 | Discharge: 2017-05-11 | Disposition: A | Discharge status: Home / Self Care | Payer: Self-pay | Source: Ambulatory Visit | Attending: Cardiology | Admitting: Cardiology

## 2017-05-14 ENCOUNTER — Encounter (HOSPITAL_COMMUNITY)
Admission: RE | Admit: 2017-05-14 | Discharge: 2017-05-14 | Disposition: A | Discharge status: Home / Self Care | Payer: Self-pay | Source: Ambulatory Visit | Attending: Cardiology | Admitting: Cardiology

## 2017-05-14 DIAGNOSIS — I712 Thoracic aortic aneurysm, without rupture: Secondary | ICD-10-CM | POA: Insufficient documentation

## 2017-05-18 ENCOUNTER — Encounter (HOSPITAL_COMMUNITY)
Admission: RE | Admit: 2017-05-18 | Discharge: 2017-05-18 | Disposition: A | Discharge status: Home / Self Care | Payer: Self-pay | Source: Ambulatory Visit | Attending: Cardiology | Admitting: Cardiology

## 2017-05-21 ENCOUNTER — Encounter (HOSPITAL_COMMUNITY)
Admission: RE | Admit: 2017-05-21 | Discharge: 2017-05-21 | Disposition: A | Discharge status: Home / Self Care | Payer: Self-pay | Source: Ambulatory Visit | Attending: Cardiology | Admitting: Cardiology

## 2017-05-23 ENCOUNTER — Encounter (HOSPITAL_COMMUNITY)
Admission: RE | Admit: 2017-05-23 | Discharge: 2017-05-23 | Disposition: A | Discharge status: Home / Self Care | Payer: Self-pay | Source: Ambulatory Visit | Attending: Cardiology | Admitting: Cardiology

## 2017-05-25 ENCOUNTER — Encounter (HOSPITAL_COMMUNITY): Payer: Self-pay

## 2017-05-28 ENCOUNTER — Encounter (HOSPITAL_COMMUNITY)
Admission: RE | Admit: 2017-05-28 | Discharge: 2017-05-28 | Disposition: A | Discharge status: Home / Self Care | Payer: Self-pay | Source: Ambulatory Visit | Attending: Cardiology | Admitting: Cardiology

## 2017-05-30 ENCOUNTER — Encounter (HOSPITAL_COMMUNITY)
Admission: RE | Admit: 2017-05-30 | Discharge: 2017-05-30 | Disposition: A | Discharge status: Home / Self Care | Payer: Self-pay | Source: Ambulatory Visit | Attending: Cardiology | Admitting: Cardiology

## 2017-06-01 ENCOUNTER — Encounter (HOSPITAL_COMMUNITY)
Admission: RE | Admit: 2017-06-01 | Discharge: 2017-06-01 | Disposition: A | Discharge status: Home / Self Care | Payer: Self-pay | Source: Ambulatory Visit | Attending: Cardiology | Admitting: Cardiology

## 2017-06-04 ENCOUNTER — Encounter: Payer: Self-pay | Admitting: Neurology

## 2017-06-04 ENCOUNTER — Encounter (HOSPITAL_COMMUNITY): Payer: Self-pay

## 2017-06-06 ENCOUNTER — Encounter (HOSPITAL_COMMUNITY)
Admission: RE | Admit: 2017-06-06 | Discharge: 2017-06-06 | Disposition: A | Discharge status: Home / Self Care | Payer: Self-pay | Source: Ambulatory Visit | Attending: Cardiology | Admitting: Cardiology

## 2017-06-08 ENCOUNTER — Encounter (HOSPITAL_COMMUNITY)
Admission: RE | Admit: 2017-06-08 | Discharge: 2017-06-08 | Disposition: A | Discharge status: Home / Self Care | Payer: Self-pay | Source: Ambulatory Visit | Attending: Cardiology | Admitting: Cardiology

## 2017-06-11 ENCOUNTER — Encounter (HOSPITAL_COMMUNITY): Payer: Self-pay

## 2017-06-13 ENCOUNTER — Encounter (HOSPITAL_COMMUNITY): Payer: Self-pay

## 2017-06-13 DIAGNOSIS — I712 Thoracic aortic aneurysm, without rupture: Secondary | ICD-10-CM | POA: Insufficient documentation

## 2017-06-15 ENCOUNTER — Encounter (HOSPITAL_COMMUNITY): Payer: Self-pay

## 2017-06-18 ENCOUNTER — Encounter (HOSPITAL_COMMUNITY)
Admission: RE | Admit: 2017-06-18 | Discharge: 2017-06-18 | Disposition: A | Discharge status: Home / Self Care | Payer: Medicare Other | Source: Ambulatory Visit | Attending: Cardiology | Admitting: Cardiology

## 2017-06-20 ENCOUNTER — Encounter (HOSPITAL_COMMUNITY)
Admission: RE | Admit: 2017-06-20 | Discharge: 2017-06-20 | Disposition: A | Discharge status: Home / Self Care | Payer: Self-pay | Source: Ambulatory Visit | Attending: Cardiology | Admitting: Cardiology

## 2017-06-22 ENCOUNTER — Encounter (HOSPITAL_COMMUNITY): Payer: Self-pay

## 2017-06-25 ENCOUNTER — Encounter (HOSPITAL_COMMUNITY)
Admission: RE | Admit: 2017-06-25 | Discharge: 2017-06-25 | Disposition: A | Discharge status: Home / Self Care | Payer: Self-pay | Source: Ambulatory Visit | Attending: Cardiology | Admitting: Cardiology

## 2017-06-27 ENCOUNTER — Encounter (HOSPITAL_COMMUNITY)
Admission: RE | Admit: 2017-06-27 | Discharge: 2017-06-27 | Disposition: A | Discharge status: Home / Self Care | Payer: Self-pay | Source: Ambulatory Visit | Attending: Cardiology | Admitting: Cardiology

## 2017-06-29 ENCOUNTER — Encounter (HOSPITAL_COMMUNITY): Payer: Self-pay

## 2017-07-02 ENCOUNTER — Encounter (HOSPITAL_COMMUNITY)
Admission: RE | Admit: 2017-07-02 | Discharge: 2017-07-02 | Disposition: A | Discharge status: Home / Self Care | Payer: Self-pay | Source: Ambulatory Visit | Attending: Cardiology | Admitting: Cardiology

## 2017-07-04 ENCOUNTER — Encounter (HOSPITAL_COMMUNITY)
Admission: RE | Admit: 2017-07-04 | Discharge: 2017-07-04 | Disposition: A | Discharge status: Home / Self Care | Payer: Self-pay | Source: Ambulatory Visit | Attending: Cardiology | Admitting: Cardiology

## 2017-07-06 ENCOUNTER — Encounter (HOSPITAL_COMMUNITY): Payer: Self-pay

## 2017-07-09 ENCOUNTER — Encounter (HOSPITAL_COMMUNITY)
Admission: RE | Admit: 2017-07-09 | Discharge: 2017-07-09 | Disposition: A | Discharge status: Home / Self Care | Payer: Self-pay | Source: Ambulatory Visit | Attending: Cardiology | Admitting: Cardiology

## 2017-07-11 ENCOUNTER — Encounter (HOSPITAL_COMMUNITY)
Admission: RE | Admit: 2017-07-11 | Discharge: 2017-07-11 | Disposition: A | Discharge status: Home / Self Care | Payer: Medicare Other | Source: Ambulatory Visit | Attending: Cardiology | Admitting: Cardiology

## 2017-07-13 ENCOUNTER — Encounter (HOSPITAL_COMMUNITY): Payer: Self-pay

## 2017-07-18 ENCOUNTER — Encounter (HOSPITAL_COMMUNITY)
Admission: RE | Admit: 2017-07-18 | Discharge: 2017-07-18 | Disposition: A | Discharge status: Home / Self Care | Payer: Self-pay | Source: Ambulatory Visit | Attending: Cardiology | Admitting: Cardiology

## 2017-07-18 DIAGNOSIS — I712 Thoracic aortic aneurysm, without rupture: Secondary | ICD-10-CM | POA: Insufficient documentation

## 2017-07-20 ENCOUNTER — Encounter (HOSPITAL_COMMUNITY): Payer: Self-pay

## 2017-07-23 ENCOUNTER — Encounter (HOSPITAL_COMMUNITY): Payer: Self-pay

## 2017-07-25 ENCOUNTER — Encounter (HOSPITAL_COMMUNITY)
Admission: RE | Admit: 2017-07-25 | Discharge: 2017-07-25 | Disposition: A | Discharge status: Home / Self Care | Payer: Self-pay | Source: Ambulatory Visit | Attending: Cardiology | Admitting: Cardiology

## 2017-07-27 ENCOUNTER — Encounter (HOSPITAL_COMMUNITY): Payer: Self-pay

## 2017-07-30 ENCOUNTER — Encounter (HOSPITAL_COMMUNITY): Payer: Self-pay

## 2017-08-01 ENCOUNTER — Encounter (HOSPITAL_COMMUNITY): Payer: Self-pay

## 2017-08-03 ENCOUNTER — Encounter (HOSPITAL_COMMUNITY): Payer: Self-pay

## 2017-08-06 ENCOUNTER — Encounter (HOSPITAL_COMMUNITY): Payer: Self-pay

## 2017-08-08 ENCOUNTER — Encounter (HOSPITAL_COMMUNITY): Payer: Self-pay

## 2017-08-10 ENCOUNTER — Encounter (HOSPITAL_COMMUNITY): Payer: Self-pay

## 2017-08-13 ENCOUNTER — Other Ambulatory Visit: Payer: Self-pay | Admitting: Family Medicine

## 2017-08-13 ENCOUNTER — Ambulatory Visit
Admission: RE | Admit: 2017-08-13 | Discharge: 2017-08-13 | Disposition: A | Discharge status: Home / Self Care | Payer: Medicare Other | Source: Ambulatory Visit | Attending: Family Medicine | Admitting: Family Medicine

## 2017-08-13 ENCOUNTER — Encounter (HOSPITAL_COMMUNITY): Payer: Self-pay

## 2017-08-13 DIAGNOSIS — R05 Cough: Secondary | ICD-10-CM

## 2017-08-13 DIAGNOSIS — I712 Thoracic aortic aneurysm, without rupture: Secondary | ICD-10-CM | POA: Insufficient documentation

## 2017-08-13 DIAGNOSIS — R059 Cough, unspecified: Secondary | ICD-10-CM

## 2017-08-15 ENCOUNTER — Encounter (HOSPITAL_COMMUNITY)
Admission: RE | Admit: 2017-08-15 | Discharge: 2017-08-15 | Disposition: A | Discharge status: Home / Self Care | Payer: Self-pay | Source: Ambulatory Visit | Attending: Cardiology | Admitting: Cardiology

## 2017-08-17 ENCOUNTER — Encounter (HOSPITAL_COMMUNITY)
Admission: RE | Admit: 2017-08-17 | Discharge: 2017-08-17 | Disposition: A | Discharge status: Home / Self Care | Payer: Self-pay | Source: Ambulatory Visit | Attending: Cardiology | Admitting: Cardiology

## 2017-08-20 ENCOUNTER — Encounter (HOSPITAL_COMMUNITY)
Admission: RE | Admit: 2017-08-20 | Discharge: 2017-08-20 | Disposition: A | Discharge status: Home / Self Care | Payer: Self-pay | Source: Ambulatory Visit | Attending: Cardiology | Admitting: Cardiology

## 2017-08-22 ENCOUNTER — Encounter (HOSPITAL_COMMUNITY)
Admission: RE | Admit: 2017-08-22 | Discharge: 2017-08-22 | Disposition: A | Discharge status: Home / Self Care | Payer: Self-pay | Source: Ambulatory Visit | Attending: Cardiology | Admitting: Cardiology

## 2017-08-24 ENCOUNTER — Other Ambulatory Visit: Payer: Medicare Other

## 2017-08-24 ENCOUNTER — Encounter: Payer: Self-pay | Admitting: Neurology

## 2017-08-24 ENCOUNTER — Ambulatory Visit (INDEPENDENT_AMBULATORY_CARE_PROVIDER_SITE_OTHER): Payer: Medicare Other | Admitting: Neurology

## 2017-08-24 ENCOUNTER — Encounter (HOSPITAL_COMMUNITY): Payer: Self-pay

## 2017-08-24 VITALS — BP 110/68 | HR 70 | Ht 70.0 in | Wt 203.0 lb

## 2017-08-24 DIAGNOSIS — G2581 Restless legs syndrome: Secondary | ICD-10-CM | POA: Diagnosis not present

## 2017-08-24 LAB — FERRITIN: Ferritin: 270 ng/mL (ref 20–380)

## 2017-08-24 MED ORDER — GABAPENTIN 300 MG PO CAPS: 300.0000 mg | ORAL_CAPSULE | Freq: Every day | ORAL | 3 refills | Status: DC

## 2017-08-24 NOTE — Progress Notes (Signed)
NEUROLOGY CONSULTATION NOTE  Garrett Meek Sr. MRN: [id number] DOB: 1940-11-17  Referring provider: Dr. Moreen Fowler Primary care provider: Dr. Moreen Fowler  Reason for consult:  Restless leg syndrome  HISTORY OF PRESENT ILLNESS: Garrett Jordan, Garrett Jordan is a 76 year old male with hypertension, hypothyroidism, gout, diabetes, chronic low back pain, AAA, BPH and B12 deficiency who presents for restless leg syndrome.  He has had restless leg since childhood.  Although he sometimes notices it during the day, he mostly experiences it at night in bed.  He describes it as a "tickling" sensation in his legs that cause him to move his legs around.  It mostly affects his right leg.  He currently takes clonazepam 0.5mg  at bedtime, but he will still wake up in the middle of the night, but often to go to the bathroom.  1mg  is more effective but causes daytime drowsiness.  His mother had restless leg.  His 2 sisters have restless leg.  He also takes B12 supplementation.  He takes Crestor for hyperlipidemia.  He is not on an SSRI.  PAST MEDICAL HISTORY: Past Medical History:  Diagnosis Date  . AAA (abdominal aortic aneurysm) ([place])   . Arthritis    knees  . Atrial fibrillation ([place])   . CAD (coronary artery disease)   . Cancer [facility])    prostate  . GERD (gastroesophageal reflux disease)   . Gout   . Hyperlipidemia   . Hypertension   . Hypothyroidism   . Kidney stone    right kidney  . Myocardial infarction ([place]) 01/1991   sp inferior  . Nerve compression    right leg  . Reflux   . Seasonal allergies   . Thyroid disease    hypothyroidism    PAST SURGICAL HISTORY: Past Surgical History:  Procedure Laterality Date  . ABDOMINAL AORTAGRAM N/A 02/28/2012   Procedure: ABDOMINAL Maxcine Ham;  Surgeon: Serafina Mitchell, MD;  Location: [facility] CATH LAB;  Service: Cardiovascular;  Laterality: N/A;  . abdominal aortagram embolization  02/28/2012  . ABDOMINAL AORTIC ANEURYSM REPAIR  Sept. 2013  . CORONARY ARTERY  BYPASS GRAFT  04/2009  . [place] and  2009   RCA  . EMBOLIZATION Right 02/28/2012   Procedure: EMBOLIZATION;  Surgeon: Serafina Mitchell, MD;  Location: [facility] CATH LAB;  Service: Cardiovascular;  Laterality: Right;  . IR GENERIC HISTORICAL  12/05/2016   IR RADIOLOGIST EVAL & MGMT 12/05/2016 Jacqulynn Cadet, MD [facility] INTERV RAD  . KIDNEY STONE SURGERY    . LEFT HEART CATHETERIZATION WITH CORONARY/GRAFT ANGIOGRAM N/A 01/08/2015   Procedure: LEFT HEART CATHETERIZATION WITH Beatrix Fetters;  Surgeon: Jacolyn Reedy, MD;  Location: [facility] CATH LAB;  Service: Cardiovascular;  Laterality: N/A;  . parathyroid adenoma    . PROSTATE SURGERY     rad seeds  . SPINE SURGERY    . TONSILLECTOMY      MEDICATIONS: Current Outpatient Prescriptions on File Prior to Visit  Medication Sig Dispense Refill  . acetaminophen (TYLENOL) 650 MG CR tablet Take 650 mg by mouth daily as needed for pain. Reported on 11/29/2015    . allopurinol (ZYLOPRIM) 300 MG tablet Take 300 mg by mouth every other day. Take in the evening - on even days of the month    . amLODipine (NORVASC) 10 MG tablet Take 10 mg by mouth daily.    Marland Kitchen aspirin EC 81 MG tablet Take 81 mg by mouth at bedtime.     . Cholecalciferol (VITAMIN D3)  2000 UNITS TABS Take 2,000 Units by mouth daily.     . clonazePAM (KLONOPIN) 1 MG tablet Take 0.5 mg by mouth at bedtime.     . clopidogrel (PLAVIX) 75 MG tablet Take 75 mg by mouth daily.  12  . Coenzyme Q10 (EQL COQ10) 300 MG CAPS Take by mouth.    . esomeprazole (NEXIUM) 20 MG capsule Take 20 mg by mouth daily at 12 noon.    . famotidine (PEPCID) 20 MG tablet Take 20 mg by mouth at bedtime. Reported on 11/29/2015    . fluticasone (FLONASE) 50 MCG/ACT nasal spray Place 2 sprays into both nostrils daily as needed for allergies.     . Glucose Blood (FREESTYLE TEST VI)     . hydroxypropyl methylcellulose / hypromellose (ISOPTO TEARS / GONIOVISC) 2.5 % ophthalmic solution Place 1 drop into  both eyes as needed for dry eyes. Reported on 11/29/2015    . ibuprofen (ADVIL,MOTRIN) 200 MG tablet Take 200 mg by mouth as needed.    Marland Kitchen levothyroxine (SYNTHROID, LEVOTHROID) 137 MCG tablet Take 150 mcg by mouth daily before breakfast.     . loratadine-pseudoephedrine (CLARITIN-D 24-HOUR) 10-240 MG per 24 hr tablet Take 1 tablet by mouth at bedtime.     . metoprolol succinate (TOPROL-XL) 100 MG 24 hr tablet Take 1 tablet (100 mg total) by mouth daily. Take with or immediately following a meal. 30 tablet 12  . Multiple Vitamin (MULTIVITAMIN WITH MINERALS) TABS Take 1 tablet by mouth at bedtime.     . Multiple Vitamins-Minerals (PRESERVISION AREDS 2) CAPS Take 1 capsule by mouth 2 (two) times daily.    Marland Kitchen olmesartan-hydrochlorothiazide (BENICAR HCT) 40-25 MG per tablet Take 1 tablet by mouth daily.     . Omega-3 Fatty Acids (FISH OIL) 1200 MG CAPS Take 1,200 mg by mouth 2 (two) times daily.    . potassium chloride SA (K-DUR,KLOR-CON) 20 MEQ tablet Take 1 tablet (20 mEq total) by mouth daily. 30 tablet 12  . rosuvastatin (CRESTOR) 10 MG tablet Take 10 mg by mouth as directed. Per patient, takes Mondays & Thursdays    . spironolactone (ALDACTONE) 25 MG tablet Take 12.5 mg by mouth every evening.     . Tamsulosin HCl (FLOMAX) 0.4 MG CAPS Take 0.4 mg by mouth every other day. Take in the evening - on even days of the month    . vitamin B-12 (CYANOCOBALAMIN) 1000 MCG tablet Take 2,500 mcg by mouth at bedtime.      No current facility-administered medications on file prior to visit.     ALLERGIES: Allergies  Allergen Reactions  . Ace Inhibitors Cough  . Crestor [Rosuvastatin] Other (See Comments)    Muscle aches  . Nitroglycerin Other (See Comments)    Very pronounced lowered BP with IV nitro for caths    FAMILY HISTORY: Family History  Problem Relation Age of Onset  . Heart disease Father        Heart Disease before age 76  . Heart attack Father   . Hyperlipidemia Father   . Hypertension  Father   . Stroke Mother   . Deep vein thrombosis Mother   . Heart disease Sister   . Diabetes Sister   . Hyperlipidemia Sister   . Hypertension Sister   . Heart attack Sister   . Heart disease Paternal Uncle   . Diabetes Maternal Grandmother   . Diabetes Sister     SOCIAL HISTORY: Social History   Social History  . Marital status:  Divorced    Spouse name: N/A  . Number of children: N/A  . Years of education: N/A   Occupational History  . Not on file.   Social History Main Topics  . Smoking status: Former Smoker    Types: Cigarettes    Quit date: 08/01/1970  . Smokeless tobacco: Never Used  . Alcohol use 3.6 - 5.4 oz/week    4 - 5 Glasses of wine, 2 - 4 Cans of beer per week     Comment: 1-3 glasses 3-4 times a week  . Drug use: No  . Sexual activity: Not on file   Other Topics Concern  . Not on file   Social History Narrative  . No narrative on file    REVIEW OF SYSTEMS: Constitutional: No fevers, chills, or sweats, no generalized fatigue, change in appetite Eyes: No visual changes, double vision, eye pain Ear, nose and throat: No hearing loss, ear pain, nasal congestion, sore throat Cardiovascular: No chest pain, palpitations Respiratory:  No shortness of breath at rest or with exertion, wheezes GastrointestinaI: No nausea, vomiting, diarrhea, abdominal pain, fecal incontinence Genitourinary:  No dysuria, urinary retention or frequency Musculoskeletal:  No neck pain, back pain Integumentary: No rash, pruritus, skin lesions Neurological: as above Psychiatric: No depression, insomnia, anxiety Endocrine: No palpitations, fatigue, diaphoresis, mood swings, change in appetite, change in weight, increased thirst Hematologic/Lymphatic:  No purpura, petechiae. Allergic/Immunologic: no itchy/runny eyes, nasal congestion, recent allergic reactions, rashes  PHYSICAL EXAM: Vitals:   08/24/17 1442  BP: 110/68  Pulse: 70   General: No acute distress.  Patient  appears well-groomed.  Head:  Normocephalic/atraumatic Eyes:  fundi examined but not visualized Neck: supple, no paraspinal tenderness, full range of motion Back: No paraspinal tenderness Heart: regular rate and rhythm Lungs: Clear to auscultation bilaterally. Vascular: No carotid bruits. Neurological Exam: Mental status: alert and oriented to person, place, and time, recent and remote memory intact, fund of knowledge intact, attention and concentration intact, speech fluent and not dysarthric, language intact. Cranial nerves: CN I: not tested CN II: pupils equal, round and reactive to light, visual fields intact CN III, IV, VI:  full range of motion, no nystagmus, no ptosis CN V: facial sensation intact CN VII: upper and lower face symmetric CN VIII: hearing intact CN IX, X: gag intact, uvula midline CN XI: sternocleidomastoid and trapezius muscles intact CN XII: tongue midline Bulk & Tone: normal, no fasciculations. Motor:  5/5 throughout  Sensation: temperature and vibration sensation intact. Deep Tendon Reflexes:  2+ throughout, toes downgoing.  Finger to nose testing:  Without dysmetria.  Heel to shin:  Without dysmetria.  Gait:  Normal station and stride.  Able to turn and tandem walk. Romberg negative.  IMPRESSION: Familial restless leg syndrome  PLAN: 1.  Gabapentin 300mg  at bedtime.  He will contact us in 4 weeks with update. 2.  Consider bar of soap under the bed sheet. 3.  Check ferritin level 4.  Follow up in 3 months.  Thank you for allowing me to take part in the care of this patient.  45 minutes spent face to face with patient, over 50% spent discussing management.  Metta Clines, DO  CC:  Antony Contras, MD

## 2017-08-24 NOTE — Patient Instructions (Signed)
1.  Start gabapentin 300mg  at bedtime.  Contact me in 4 weeks with update. 2.  We will check ferritin level 3.  Consider placing a bar of soap under sheets near your leg. 4.  Follow up in 3 months.

## 2017-08-27 ENCOUNTER — Encounter (HOSPITAL_COMMUNITY)
Admission: RE | Admit: 2017-08-27 | Discharge: 2017-08-27 | Disposition: A | Discharge status: Home / Self Care | Payer: Self-pay | Source: Ambulatory Visit | Attending: Cardiology | Admitting: Cardiology

## 2017-08-27 ENCOUNTER — Telehealth: Payer: Self-pay

## 2017-08-27 NOTE — Telephone Encounter (Signed)
-----   Message from Pieter Partridge, DO sent at 08/27/2017  7:16 AM EDT ----- Ferritin level is normal

## 2017-08-27 NOTE — Telephone Encounter (Signed)
Called Pt, advsd of Fe labe results. Pt asked about gabapentin Rx, wanted to know if was ever sent to [facility] [address] of [address], advsd Pt was rcvd by them on 08/24/17 @ 3:55pm

## 2017-08-29 ENCOUNTER — Other Ambulatory Visit: Payer: Self-pay

## 2017-08-29 ENCOUNTER — Encounter (HOSPITAL_COMMUNITY)
Admission: RE | Admit: 2017-08-29 | Discharge: 2017-08-29 | Disposition: A | Discharge status: Home / Self Care | Payer: Self-pay | Source: Ambulatory Visit | Attending: Cardiology | Admitting: Cardiology

## 2017-08-29 MED ORDER — GABAPENTIN 300 MG PO CAPS: 300.0000 mg | ORAL_CAPSULE | Freq: Every day | ORAL | 3 refills | Status: DC

## 2017-08-31 ENCOUNTER — Encounter (HOSPITAL_COMMUNITY): Payer: Self-pay

## 2017-09-03 ENCOUNTER — Encounter (HOSPITAL_COMMUNITY)
Admission: RE | Admit: 2017-09-03 | Discharge: 2017-09-03 | Disposition: A | Discharge status: Home / Self Care | Payer: Self-pay | Source: Ambulatory Visit | Attending: Cardiology | Admitting: Cardiology

## 2017-09-05 ENCOUNTER — Encounter (HOSPITAL_COMMUNITY)
Admission: RE | Admit: 2017-09-05 | Discharge: 2017-09-05 | Disposition: A | Discharge status: Home / Self Care | Payer: Self-pay | Source: Ambulatory Visit | Attending: Cardiology | Admitting: Cardiology

## 2017-09-07 ENCOUNTER — Encounter (HOSPITAL_COMMUNITY): Payer: Self-pay

## 2017-09-10 ENCOUNTER — Encounter (HOSPITAL_COMMUNITY)
Admission: RE | Admit: 2017-09-10 | Discharge: 2017-09-10 | Disposition: A | Discharge status: Home / Self Care | Payer: Self-pay | Source: Ambulatory Visit | Attending: Cardiology | Admitting: Cardiology

## 2017-09-12 ENCOUNTER — Encounter (HOSPITAL_COMMUNITY): Payer: Self-pay

## 2017-09-14 ENCOUNTER — Encounter (HOSPITAL_COMMUNITY)
Admission: RE | Admit: 2017-09-14 | Discharge: 2017-09-14 | Disposition: A | Discharge status: Home / Self Care | Payer: Self-pay | Source: Ambulatory Visit | Attending: Family Medicine | Admitting: Family Medicine

## 2017-09-14 DIAGNOSIS — I712 Thoracic aortic aneurysm, without rupture: Secondary | ICD-10-CM | POA: Insufficient documentation

## 2017-09-17 ENCOUNTER — Encounter (HOSPITAL_COMMUNITY)
Admission: RE | Admit: 2017-09-17 | Discharge: 2017-09-17 | Disposition: A | Discharge status: Home / Self Care | Payer: Self-pay | Source: Ambulatory Visit | Attending: Cardiology | Admitting: Cardiology

## 2017-09-18 ENCOUNTER — Ambulatory Visit: Payer: Medicare Other | Admitting: Neurology

## 2017-09-19 ENCOUNTER — Encounter (HOSPITAL_COMMUNITY)
Admission: RE | Admit: 2017-09-19 | Discharge: 2017-09-19 | Disposition: A | Discharge status: Home / Self Care | Payer: Self-pay | Source: Ambulatory Visit | Attending: Cardiology | Admitting: Cardiology

## 2017-09-21 ENCOUNTER — Encounter (HOSPITAL_COMMUNITY)
Admission: RE | Admit: 2017-09-21 | Discharge: 2017-09-21 | Disposition: A | Discharge status: Home / Self Care | Payer: Self-pay | Source: Ambulatory Visit | Attending: Cardiology | Admitting: Cardiology

## 2017-09-24 ENCOUNTER — Encounter (HOSPITAL_COMMUNITY)
Admission: RE | Admit: 2017-09-24 | Discharge: 2017-09-24 | Disposition: A | Discharge status: Home / Self Care | Payer: Self-pay | Source: Ambulatory Visit | Attending: Pulmonary Disease | Admitting: Pulmonary Disease

## 2017-10-01 ENCOUNTER — Encounter (HOSPITAL_COMMUNITY): Payer: Self-pay

## 2017-10-01 ENCOUNTER — Encounter (HOSPITAL_COMMUNITY)
Admission: RE | Admit: 2017-10-01 | Discharge: 2017-10-01 | Disposition: A | Discharge status: Home / Self Care | Payer: Self-pay | Source: Ambulatory Visit | Attending: Cardiology | Admitting: Cardiology

## 2017-10-03 ENCOUNTER — Encounter (HOSPITAL_COMMUNITY)
Admission: RE | Admit: 2017-10-03 | Discharge: 2017-10-03 | Disposition: A | Discharge status: Home / Self Care | Payer: Self-pay | Source: Ambulatory Visit | Attending: Cardiology | Admitting: Cardiology

## 2017-10-03 ENCOUNTER — Encounter (HOSPITAL_COMMUNITY): Payer: Self-pay

## 2017-10-05 ENCOUNTER — Encounter (HOSPITAL_COMMUNITY): Payer: Self-pay

## 2017-10-08 ENCOUNTER — Encounter (HOSPITAL_COMMUNITY): Payer: Self-pay

## 2017-10-08 ENCOUNTER — Encounter (HOSPITAL_COMMUNITY)
Admission: RE | Admit: 2017-10-08 | Discharge: 2017-10-08 | Disposition: A | Discharge status: Home / Self Care | Payer: Self-pay | Source: Ambulatory Visit | Attending: Cardiology | Admitting: Cardiology

## 2017-10-10 ENCOUNTER — Encounter (HOSPITAL_COMMUNITY)
Admission: RE | Admit: 2017-10-10 | Discharge: 2017-10-10 | Disposition: A | Discharge status: Home / Self Care | Payer: Self-pay | Source: Ambulatory Visit | Attending: Cardiology | Admitting: Cardiology

## 2017-10-10 ENCOUNTER — Encounter (HOSPITAL_COMMUNITY): Payer: Self-pay

## 2017-10-12 ENCOUNTER — Encounter (HOSPITAL_COMMUNITY): Payer: Self-pay

## 2017-10-15 ENCOUNTER — Encounter (HOSPITAL_COMMUNITY)
Admission: RE | Admit: 2017-10-15 | Discharge: 2017-10-15 | Disposition: A | Discharge status: Home / Self Care | Payer: Self-pay | Source: Ambulatory Visit | Attending: Cardiology | Admitting: Cardiology

## 2017-10-15 ENCOUNTER — Encounter (HOSPITAL_COMMUNITY): Payer: Self-pay

## 2017-10-15 DIAGNOSIS — I712 Thoracic aortic aneurysm, without rupture: Secondary | ICD-10-CM | POA: Insufficient documentation

## 2017-10-17 ENCOUNTER — Encounter (HOSPITAL_COMMUNITY): Payer: Self-pay

## 2017-10-19 ENCOUNTER — Encounter (HOSPITAL_COMMUNITY): Payer: Self-pay

## 2017-10-22 ENCOUNTER — Encounter (HOSPITAL_COMMUNITY): Payer: Self-pay

## 2017-10-24 ENCOUNTER — Encounter (HOSPITAL_COMMUNITY): Payer: Self-pay

## 2017-10-24 ENCOUNTER — Encounter (HOSPITAL_COMMUNITY)
Admission: RE | Admit: 2017-10-24 | Discharge: 2017-10-24 | Disposition: A | Discharge status: Home / Self Care | Payer: Self-pay | Source: Ambulatory Visit | Attending: Cardiology | Admitting: Cardiology

## 2017-10-26 ENCOUNTER — Encounter (HOSPITAL_COMMUNITY): Payer: Self-pay

## 2017-10-26 ENCOUNTER — Encounter (HOSPITAL_COMMUNITY)
Admission: RE | Admit: 2017-10-26 | Discharge: 2017-10-26 | Disposition: A | Discharge status: Home / Self Care | Payer: Medicare Other | Source: Ambulatory Visit | Attending: Cardiology | Admitting: Cardiology

## 2017-10-29 ENCOUNTER — Encounter (HOSPITAL_COMMUNITY)
Admission: RE | Admit: 2017-10-29 | Discharge: 2017-10-29 | Disposition: A | Discharge status: Home / Self Care | Payer: Self-pay | Source: Ambulatory Visit | Attending: Cardiology | Admitting: Cardiology

## 2017-10-29 ENCOUNTER — Encounter (HOSPITAL_COMMUNITY): Payer: Self-pay

## 2017-10-31 ENCOUNTER — Encounter (HOSPITAL_COMMUNITY)
Admission: RE | Admit: 2017-10-31 | Discharge: 2017-10-31 | Disposition: A | Discharge status: Home / Self Care | Payer: Medicare Other | Source: Ambulatory Visit | Attending: Cardiology | Admitting: Cardiology

## 2017-10-31 ENCOUNTER — Encounter (HOSPITAL_COMMUNITY): Payer: Self-pay

## 2017-11-02 ENCOUNTER — Encounter (HOSPITAL_COMMUNITY): Payer: Self-pay

## 2017-11-05 ENCOUNTER — Encounter (HOSPITAL_COMMUNITY): Payer: Self-pay

## 2017-11-07 ENCOUNTER — Encounter (HOSPITAL_COMMUNITY): Payer: Self-pay

## 2017-11-09 ENCOUNTER — Encounter (HOSPITAL_COMMUNITY)
Admission: RE | Admit: 2017-11-09 | Discharge: 2017-11-09 | Disposition: A | Discharge status: Home / Self Care | Payer: Self-pay | Source: Ambulatory Visit | Attending: Cardiology | Admitting: Cardiology

## 2017-11-09 ENCOUNTER — Encounter (HOSPITAL_COMMUNITY): Payer: Self-pay

## 2017-11-11 ENCOUNTER — Encounter: Payer: Self-pay | Admitting: Surgery

## 2017-11-12 ENCOUNTER — Encounter (HOSPITAL_COMMUNITY): Payer: Self-pay

## 2017-11-12 ENCOUNTER — Encounter (HOSPITAL_COMMUNITY)
Admission: RE | Admit: 2017-11-12 | Discharge: 2017-11-12 | Disposition: A | Discharge status: Home / Self Care | Payer: Self-pay | Source: Ambulatory Visit | Attending: Cardiology | Admitting: Cardiology

## 2017-11-14 ENCOUNTER — Encounter (HOSPITAL_COMMUNITY): Payer: Self-pay

## 2017-11-14 ENCOUNTER — Encounter (HOSPITAL_COMMUNITY)
Admission: RE | Admit: 2017-11-14 | Discharge: 2017-11-14 | Disposition: A | Discharge status: Home / Self Care | Payer: Self-pay | Source: Ambulatory Visit | Attending: Cardiology | Admitting: Cardiology

## 2017-11-14 DIAGNOSIS — I712 Thoracic aortic aneurysm, without rupture: Secondary | ICD-10-CM | POA: Insufficient documentation

## 2017-11-16 ENCOUNTER — Encounter (HOSPITAL_COMMUNITY): Payer: Self-pay

## 2017-11-19 ENCOUNTER — Encounter (HOSPITAL_COMMUNITY): Payer: Self-pay

## 2017-11-20 ENCOUNTER — Encounter: Payer: Self-pay | Admitting: Cardiology

## 2017-11-21 ENCOUNTER — Encounter (HOSPITAL_COMMUNITY): Payer: Self-pay

## 2017-11-23 ENCOUNTER — Encounter (HOSPITAL_COMMUNITY)
Admission: RE | Admit: 2017-11-23 | Discharge: 2017-11-23 | Disposition: A | Discharge status: Home / Self Care | Payer: Medicare Other | Source: Ambulatory Visit | Attending: Cardiology | Admitting: Cardiology

## 2017-11-23 ENCOUNTER — Encounter (HOSPITAL_COMMUNITY): Payer: Self-pay

## 2017-11-24 ENCOUNTER — Other Ambulatory Visit: Payer: Self-pay | Admitting: Cardiology

## 2017-11-24 DIAGNOSIS — I714 Abdominal aortic aneurysm, without rupture, unspecified: Secondary | ICD-10-CM

## 2017-11-26 ENCOUNTER — Encounter (HOSPITAL_COMMUNITY): Payer: Self-pay

## 2017-11-26 ENCOUNTER — Encounter (HOSPITAL_COMMUNITY)
Admission: RE | Admit: 2017-11-26 | Discharge: 2017-11-26 | Disposition: A | Discharge status: Home / Self Care | Payer: Self-pay | Source: Ambulatory Visit | Attending: Cardiology | Admitting: Cardiology

## 2017-11-28 ENCOUNTER — Encounter (HOSPITAL_COMMUNITY)
Admission: RE | Admit: 2017-11-28 | Discharge: 2017-11-28 | Disposition: A | Discharge status: Home / Self Care | Payer: Self-pay | Source: Ambulatory Visit | Attending: Cardiology | Admitting: Cardiology

## 2017-11-28 ENCOUNTER — Encounter (HOSPITAL_COMMUNITY): Payer: Self-pay

## 2017-11-29 ENCOUNTER — Ambulatory Visit
Admission: RE | Admit: 2017-11-29 | Discharge: 2017-11-29 | Disposition: A | Discharge status: Home / Self Care | Payer: Medicare Other | Source: Ambulatory Visit | Attending: Cardiology | Admitting: Cardiology

## 2017-11-29 DIAGNOSIS — I714 Abdominal aortic aneurysm, without rupture, unspecified: Secondary | ICD-10-CM

## 2017-11-29 MED ORDER — IOPAMIDOL (ISOVUE-370) INJECTION 76%
60.0000 mL | Freq: Once | INTRAVENOUS | Status: AC | PRN
  Administered 2017-11-29: 60 mL via INTRAVENOUS

## 2017-11-30 ENCOUNTER — Encounter (HOSPITAL_COMMUNITY): Payer: Self-pay

## 2017-11-30 ENCOUNTER — Encounter (HOSPITAL_COMMUNITY)
Admission: RE | Admit: 2017-11-30 | Discharge: 2017-11-30 | Disposition: A | Discharge status: Home / Self Care | Payer: Self-pay | Source: Ambulatory Visit | Attending: Cardiology | Admitting: Cardiology

## 2017-12-03 ENCOUNTER — Ambulatory Visit: Payer: Medicare Other | Admitting: Neurology

## 2017-12-03 ENCOUNTER — Encounter: Payer: Self-pay | Admitting: Neurology

## 2017-12-03 ENCOUNTER — Encounter (HOSPITAL_COMMUNITY)
Admission: RE | Admit: 2017-12-03 | Discharge: 2017-12-03 | Disposition: A | Discharge status: Home / Self Care | Payer: Medicare Other | Source: Ambulatory Visit | Attending: Cardiology | Admitting: Cardiology

## 2017-12-03 ENCOUNTER — Encounter (HOSPITAL_COMMUNITY): Payer: Self-pay

## 2017-12-03 VITALS — BP 102/60 | HR 75 | Ht 70.0 in | Wt 208.0 lb

## 2017-12-03 DIAGNOSIS — G2581 Restless legs syndrome: Secondary | ICD-10-CM

## 2017-12-03 MED ORDER — ROPINIROLE HCL 0.5 MG PO TABS: ORAL_TABLET | ORAL | 2 refills | Status: DC

## 2017-12-03 NOTE — Progress Notes (Signed)
NEUROLOGY FOLLOW UP OFFICE NOTE  Garrett HOLNESS Garrett Jordan. [id number]  HISTORY OF PRESENT ILLNESS: Garrett Jordan, Garrett Jordan is a 77 year old male with hypertension, hypothyroidism, gout, diabetes, chronic low back pain, AAA, BPH and B12 deficiency who follows up for restless leg syndrome.  UPDATE: He discontinued gabapentin 300mg  at bedtime because it was ineffective and it kept him awake at night.  Ferritin level was 270.   HISTORY: He has had restless leg since childhood.  Although he sometimes notices it during the day, he mostly experiences it at night in bed.  He describes it as a "tickling" sensation in his legs that cause him to move his legs around.  It mostly affects his right leg.  He currently takes clonazepam 0.5mg  at bedtime, but he will still wake up in the middle of the night, but often to go to the bathroom.  1mg  is more effective but causes daytime drowsiness.   His mother had restless leg.  His 2 sisters have restless leg.   He also takes B12 supplementation.  He takes Crestor twice a week for hyperlipidemia.  He is not on an SSRI.  PAST MEDICAL HISTORY: Past Medical History:  Diagnosis Date  . AAA (abdominal aortic aneurysm) ([place])   . Arthritis    knees  . Atrial fibrillation ([place])   . CAD (coronary artery disease)   . Cancer [facility])    prostate  . GERD (gastroesophageal reflux disease)   . Gout   . Hyperlipidemia   . Hypertension   . Hypothyroidism   . Kidney stone    right kidney  . Myocardial infarction ([place]) 01/1991   sp inferior  . Nerve compression    right leg  . Reflux   . Seasonal allergies   . Thyroid disease    hypothyroidism    MEDICATIONS: Current Outpatient Medications on File Prior to Visit  Medication Sig Dispense Refill  . acetaminophen (TYLENOL) 650 MG CR tablet Take 650 mg by mouth daily as needed for pain. Reported on 11/29/2015    . allopurinol (ZYLOPRIM) 300 MG tablet Take 300 mg by mouth every other day. Take in the evening - on even days  of the month    . amLODipine (NORVASC) 10 MG tablet Take 10 mg by mouth daily.    Marland Kitchen aspirin EC 81 MG tablet Take 81 mg by mouth at bedtime.     . Cholecalciferol (VITAMIN D3) 2000 UNITS TABS Take 2,000 Units by mouth daily.     . clonazePAM (KLONOPIN) 1 MG tablet Take 0.5 mg by mouth at bedtime.     . clopidogrel (PLAVIX) 75 MG tablet Take 75 mg by mouth daily.  12  . Coenzyme Q10 (EQL COQ10) 300 MG CAPS Take by mouth.    . esomeprazole (NEXIUM) 20 MG capsule Take 20 mg by mouth daily at 12 noon.    . famotidine (PEPCID) 20 MG tablet Take 20 mg by mouth at bedtime. Reported on 11/29/2015    . fluticasone (FLONASE) 50 MCG/ACT nasal spray Place 2 sprays into both nostrils daily as needed for allergies.     Marland Kitchen gabapentin (NEURONTIN) 300 MG capsule Take 1 capsule (300 mg total) by mouth at bedtime. (Patient not taking: Reported on 12/03/2017) 30 capsule 3  . Glucose Blood (FREESTYLE TEST VI)     . hydroxypropyl methylcellulose / hypromellose (ISOPTO TEARS / GONIOVISC) 2.5 % ophthalmic solution Place 1 drop into both eyes as needed for dry eyes. Reported on 11/29/2015    .  ibuprofen (ADVIL,MOTRIN) 200 MG tablet Take 200 mg by mouth as needed.    Marland Kitchen levothyroxine (SYNTHROID, LEVOTHROID) 137 MCG tablet Take 150 mcg by mouth daily before breakfast.     . loratadine-pseudoephedrine (CLARITIN-D 24-HOUR) 10-240 MG per 24 hr tablet Take 1 tablet by mouth at bedtime.     . metoprolol succinate (TOPROL-XL) 100 MG 24 hr tablet Take 1 tablet (100 mg total) by mouth daily. Take with or immediately following a meal. 30 tablet 12  . Multiple Vitamin (MULTIVITAMIN WITH MINERALS) TABS Take 1 tablet by mouth at bedtime.     . Multiple Vitamins-Minerals (PRESERVISION AREDS 2) CAPS Take 1 capsule by mouth 2 (two) times daily.    Marland Kitchen olmesartan-hydrochlorothiazide (BENICAR HCT) 40-25 MG per tablet Take 1 tablet by mouth daily.     . Omega-3 Fatty Acids (FISH OIL) 1200 MG CAPS Take 1,200 mg by mouth 2 (two) times daily.    .  potassium chloride SA (K-DUR,KLOR-CON) 20 MEQ tablet Take 1 tablet (20 mEq total) by mouth daily. 30 tablet 12  . rosuvastatin (CRESTOR) 10 MG tablet Take 10 mg by mouth as directed. Per patient, takes Mondays & Thursdays    . spironolactone (ALDACTONE) 25 MG tablet Take 12.5 mg by mouth every evening.     . Tamsulosin HCl (FLOMAX) 0.4 MG CAPS Take 0.4 mg by mouth every other day. Take in the evening - on even days of the month    . vitamin B-12 (CYANOCOBALAMIN) 1000 MCG tablet Take 2,500 mcg by mouth at bedtime.      No current facility-administered medications on file prior to visit.     ALLERGIES: Allergies  Allergen Reactions  . Ace Inhibitors Cough  . Crestor [Rosuvastatin] Other (See Comments)    Muscle aches  . Nitroglycerin Other (See Comments)    Very pronounced lowered BP with IV nitro for caths    FAMILY HISTORY: Family History  Problem Relation Age of Onset  . Heart disease Father        Heart Disease before age 68  . Heart attack Father   . Hyperlipidemia Father   . Hypertension Father   . Stroke Mother   . Deep vein thrombosis Mother   . Heart disease Sister   . Diabetes Sister   . Hyperlipidemia Sister   . Hypertension Sister   . Heart attack Sister   . Heart disease Paternal Uncle   . Diabetes Maternal Grandmother   . Diabetes Sister     SOCIAL HISTORY: Social History   Socioeconomic History  . Marital status: Divorced    Spouse name: Not on file  . Number of children: Not on file  . Years of education: Not on file  . Highest education level: Not on file  Social Needs  . Financial resource strain: Not on file  . Food insecurity - worry: Not on file  . Food insecurity - inability: Not on file  . Transportation needs - medical: Not on file  . Transportation needs - non-medical: Not on file  Occupational History  . Not on file  Tobacco Use  . Smoking status: Former Smoker    Types: Cigarettes    Last attempt to quit: 08/01/1970    Years since  quitting: 47.3  . Smokeless tobacco: Never Used  Substance and Sexual Activity  . Alcohol use: Yes    Alcohol/week: 3.6 - 5.4 oz    Types: 4 - 5 Glasses of wine, 2 - 4 Cans of beer per week  Comment: 1-3 glasses 3-4 times a week  . Drug use: No  . Sexual activity: Not on file  Other Topics Concern  . Not on file  Social History Narrative  . Not on file    REVIEW OF SYSTEMS: Constitutional: No fevers, chills, or sweats, no generalized fatigue, change in appetite Eyes: No visual changes, double vision, eye pain Ear, nose and throat: No hearing loss, ear pain, nasal congestion, sore throat Cardiovascular: No chest pain, palpitations Respiratory:  No shortness of breath at rest or with exertion, wheezes GastrointestinaI: No nausea, vomiting, diarrhea, abdominal pain, fecal incontinence Genitourinary:  No dysuria, urinary retention or frequency Musculoskeletal:  No neck pain, back pain Integumentary: No rash, pruritus, skin lesions Neurological: as above Psychiatric: No depression, insomnia, anxiety Endocrine: No palpitations, fatigue, diaphoresis, mood swings, change in appetite, change in weight, increased thirst Hematologic/Lymphatic:  No purpura, petechiae. Allergic/Immunologic: no itchy/runny eyes, nasal congestion, recent allergic reactions, rashes  PHYSICAL EXAM: Vitals:   12/03/17 1051  BP: 102/60  Pulse: 75  SpO2: 96%   General: No acute distress.  Patient appears well-groomed.  Head:  Normocephalic/atraumatic Eyes:  Fundi examined but not visualized Neurological Exam: alert and oriented to person, place, and time. Attention span and concentration intact, recent and remote memory intact, fund of knowledge intact.  Speech fluent and not dysarthric, language intact.  CN II-XII intact. Bulk and tone normal, muscle strength 5/5 throughout.  Sensation to light touch, temperature and vibration intact.  Deep tendon reflexes 2+ throughout, toes downgoing.  Finger to nose and  heel to shin testing intact.  Gait normal, Romberg negative.  IMPRESSION: Restless leg syndrome  PLAN: 1.  Start ropinirole 0.5mg  2 hours prior to bedtime.  Advised to monitor for potential side effects such as compulsive behavior, vivid dreams or hallucinations.  In 2 weeks, he may contact me if ineffective and we can increase dose. 2.  Follow up in 3 months.  15 minutes spent face to face with patient, over 50% spent discussing management.  Metta Clines, DO  CC:  Antony Contras, MD

## 2017-12-03 NOTE — Patient Instructions (Signed)
1.  Start ropinirole 0.5mg .  Take 2 hours before bedtime.  Monitor for hallucinations, vivid dreams and compulsive behavior. 2.  Follow up in 3 months.

## 2017-12-05 ENCOUNTER — Encounter (HOSPITAL_COMMUNITY): Payer: Self-pay

## 2017-12-07 ENCOUNTER — Encounter (HOSPITAL_COMMUNITY): Payer: Self-pay

## 2017-12-10 ENCOUNTER — Encounter (HOSPITAL_COMMUNITY): Payer: Self-pay

## 2017-12-12 ENCOUNTER — Encounter (HOSPITAL_COMMUNITY): Payer: Self-pay

## 2017-12-12 ENCOUNTER — Encounter (HOSPITAL_COMMUNITY)
Admission: RE | Admit: 2017-12-12 | Discharge: 2017-12-12 | Disposition: A | Discharge status: Home / Self Care | Payer: Self-pay | Source: Ambulatory Visit | Attending: Cardiology | Admitting: Cardiology

## 2017-12-14 ENCOUNTER — Encounter (HOSPITAL_COMMUNITY): Payer: Self-pay

## 2017-12-14 DIAGNOSIS — I712 Thoracic aortic aneurysm, without rupture: Secondary | ICD-10-CM | POA: Insufficient documentation

## 2017-12-17 ENCOUNTER — Encounter (HOSPITAL_COMMUNITY)
Admission: RE | Admit: 2017-12-17 | Discharge: 2017-12-17 | Disposition: A | Discharge status: Home / Self Care | Payer: Self-pay | Source: Ambulatory Visit | Attending: Cardiology | Admitting: Cardiology

## 2017-12-17 ENCOUNTER — Encounter (HOSPITAL_COMMUNITY): Payer: Self-pay

## 2017-12-19 ENCOUNTER — Encounter (HOSPITAL_COMMUNITY)
Admission: RE | Admit: 2017-12-19 | Discharge: 2017-12-19 | Disposition: A | Discharge status: Home / Self Care | Payer: Self-pay | Source: Ambulatory Visit | Attending: Cardiology | Admitting: Cardiology

## 2017-12-19 ENCOUNTER — Encounter (HOSPITAL_COMMUNITY): Payer: Self-pay

## 2017-12-21 ENCOUNTER — Encounter (HOSPITAL_COMMUNITY): Payer: Self-pay

## 2017-12-24 ENCOUNTER — Encounter (HOSPITAL_COMMUNITY): Payer: Self-pay

## 2017-12-24 ENCOUNTER — Encounter (HOSPITAL_COMMUNITY)
Admission: RE | Admit: 2017-12-24 | Discharge: 2017-12-24 | Disposition: A | Discharge status: Home / Self Care | Payer: Medicare Other | Source: Ambulatory Visit | Attending: Cardiology | Admitting: Cardiology

## 2017-12-26 ENCOUNTER — Encounter (HOSPITAL_COMMUNITY)
Admission: RE | Admit: 2017-12-26 | Discharge: 2017-12-26 | Disposition: A | Discharge status: Home / Self Care | Payer: Self-pay | Source: Ambulatory Visit | Attending: Cardiology | Admitting: Cardiology

## 2017-12-26 ENCOUNTER — Encounter (HOSPITAL_COMMUNITY): Payer: Self-pay

## 2017-12-28 ENCOUNTER — Encounter (HOSPITAL_COMMUNITY)
Admission: RE | Admit: 2017-12-28 | Discharge: 2017-12-28 | Disposition: A | Discharge status: Home / Self Care | Payer: Self-pay | Source: Ambulatory Visit | Attending: Cardiology | Admitting: Cardiology

## 2017-12-28 ENCOUNTER — Encounter (HOSPITAL_COMMUNITY): Payer: Self-pay

## 2017-12-31 ENCOUNTER — Encounter (HOSPITAL_COMMUNITY)
Admission: RE | Admit: 2017-12-31 | Discharge: 2017-12-31 | Disposition: A | Discharge status: Home / Self Care | Payer: Self-pay | Source: Ambulatory Visit | Attending: Cardiology | Admitting: Cardiology

## 2017-12-31 ENCOUNTER — Encounter (HOSPITAL_COMMUNITY): Payer: Self-pay

## 2018-01-02 ENCOUNTER — Encounter (HOSPITAL_COMMUNITY): Payer: Self-pay

## 2018-01-02 ENCOUNTER — Ambulatory Visit: Payer: Medicare Other | Admitting: Cardiothoracic Surgery

## 2018-01-04 ENCOUNTER — Encounter (HOSPITAL_COMMUNITY)
Admission: RE | Admit: 2018-01-04 | Discharge: 2018-01-04 | Disposition: A | Discharge status: Home / Self Care | Payer: Medicare Other | Source: Ambulatory Visit | Attending: Cardiology | Admitting: Cardiology

## 2018-01-04 ENCOUNTER — Encounter (HOSPITAL_COMMUNITY): Payer: Self-pay

## 2018-01-07 ENCOUNTER — Encounter (HOSPITAL_COMMUNITY): Payer: Self-pay

## 2018-01-09 ENCOUNTER — Encounter (HOSPITAL_COMMUNITY)
Admission: RE | Admit: 2018-01-09 | Discharge: 2018-01-09 | Disposition: A | Discharge status: Home / Self Care | Payer: Self-pay | Source: Ambulatory Visit | Attending: Cardiology | Admitting: Cardiology

## 2018-01-09 ENCOUNTER — Encounter (HOSPITAL_COMMUNITY): Payer: Self-pay

## 2018-01-11 ENCOUNTER — Encounter (HOSPITAL_COMMUNITY)
Admission: RE | Admit: 2018-01-11 | Discharge: 2018-01-11 | Disposition: A | Discharge status: Home / Self Care | Payer: Self-pay | Source: Ambulatory Visit | Attending: Cardiology | Admitting: Cardiology

## 2018-01-11 ENCOUNTER — Encounter (HOSPITAL_COMMUNITY): Payer: Self-pay

## 2018-01-11 DIAGNOSIS — I712 Thoracic aortic aneurysm, without rupture: Secondary | ICD-10-CM | POA: Insufficient documentation

## 2018-01-14 ENCOUNTER — Encounter (HOSPITAL_COMMUNITY)
Admission: RE | Admit: 2018-01-14 | Discharge: 2018-01-14 | Disposition: A | Discharge status: Home / Self Care | Payer: Self-pay | Source: Ambulatory Visit | Attending: Cardiology | Admitting: Cardiology

## 2018-01-14 ENCOUNTER — Encounter (HOSPITAL_COMMUNITY): Payer: Self-pay

## 2018-01-16 ENCOUNTER — Encounter (HOSPITAL_COMMUNITY)
Admission: RE | Admit: 2018-01-16 | Discharge: 2018-01-16 | Disposition: A | Discharge status: Home / Self Care | Payer: Self-pay | Source: Ambulatory Visit | Attending: Cardiology | Admitting: Cardiology

## 2018-01-16 ENCOUNTER — Encounter (HOSPITAL_COMMUNITY): Payer: Self-pay

## 2018-01-16 NOTE — Progress Notes (Addendum)
Nutrition Note Spoke with pt. Pt recently dx with DM (~ 3 weeks ago started taking Tradjenta). Pt reports Tradjenta changed to another medication "in the same category" due to cost of Tradjenta. Pt had several questions re: diabetes. Diabetes discussed. Pt educated re: foods with carbohydrates. Handout: Carb Counting for People with Diabetes and 5-day diabetic menu ideas given. Continue client-centered nutrition education by RD as part of interdisciplinary care.  Monitor and evaluate progress toward nutrition goal with team.  Derek Mound, M.Ed, RD, LDN, CDE 01/16/2018 11:12 AM

## 2018-01-18 ENCOUNTER — Encounter (HOSPITAL_COMMUNITY): Payer: Self-pay

## 2018-01-21 ENCOUNTER — Encounter (HOSPITAL_COMMUNITY): Payer: Self-pay

## 2018-01-21 ENCOUNTER — Telehealth (HOSPITAL_COMMUNITY): Payer: Self-pay | Admitting: *Deleted

## 2018-01-21 ENCOUNTER — Encounter (HOSPITAL_COMMUNITY)
Admission: RE | Admit: 2018-01-21 | Discharge: 2018-01-21 | Disposition: A | Discharge status: Home / Self Care | Payer: Self-pay | Source: Ambulatory Visit | Attending: Cardiology | Admitting: Cardiology

## 2018-01-21 NOTE — Progress Notes (Signed)
Nutrition Note Spoke with pt. Pt states Tradjenta changed to Onglyza 2.5 mg. Pt denies questions re: previous DM education at this time. Will notify RN re: updated medication. Continue client-centered nutrition education by RD as part of interdisciplinary care.  Monitor and evaluate progress toward nutrition goal with team.  Derek Mound, M.Ed, RD, LDN, CDE 01/21/2018 10:29 AM

## 2018-01-22 ENCOUNTER — Encounter: Payer: Self-pay | Admitting: Neurology

## 2018-01-22 NOTE — Progress Notes (Signed)
Updated Tom's medication list as he has started ona new medication for diabetes.Barnet Pall, RN,BSN 01/22/2018 10:02 AM

## 2018-01-23 ENCOUNTER — Encounter (HOSPITAL_COMMUNITY): Payer: Self-pay

## 2018-01-23 ENCOUNTER — Ambulatory Visit: Payer: Medicare Other | Admitting: Cardiothoracic Surgery

## 2018-01-23 ENCOUNTER — Other Ambulatory Visit: Payer: Self-pay

## 2018-01-23 ENCOUNTER — Encounter: Payer: Self-pay | Admitting: Cardiothoracic Surgery

## 2018-01-23 ENCOUNTER — Encounter (HOSPITAL_COMMUNITY)
Admission: RE | Admit: 2018-01-23 | Discharge: 2018-01-23 | Disposition: A | Discharge status: Home / Self Care | Payer: Self-pay | Source: Ambulatory Visit | Attending: Cardiology | Admitting: Cardiology

## 2018-01-23 VITALS — BP 122/80 | HR 63 | Resp 18 | Ht 70.0 in | Wt 200.8 lb

## 2018-01-23 DIAGNOSIS — I714 Abdominal aortic aneurysm, without rupture, unspecified: Secondary | ICD-10-CM

## 2018-01-23 DIAGNOSIS — I700 Atherosclerosis of aorta: Secondary | ICD-10-CM

## 2018-01-23 NOTE — Progress Notes (Signed)
PCP is Antony Contras, MD Referring Provider is Antony Contras, MD  Chief Complaint  Patient presents with  . 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  No dissection.  Patient had CABG x5 in 2010.  The patient has had previous Evar of a abdominal aneurysm by Dr. Trula Slade and had an annual CTA of his thoracic and abdominal aorta.  I personally reviewed those images and his thoracic aortic atherosclerotic disease-plaque of the distal arch remains stable and minimal.  The interpretation of the abdominal stent graft is that there is no endoleak in the aneurysm sac remains stable.  Symptomatically the patient is doing well and goes for a [employer] almost every day.  He denies any symptoms of angina or heart failure or abdominal pain. Past Medical History:  Diagnosis Date  . AAA (abdominal aortic aneurysm) ([place])   . Arthritis    knees  . Atrial fibrillation ([place])   . CAD (coronary artery disease)   . Cancer [facility])    prostate  . GERD (gastroesophageal reflux disease)   . Gout   . Hyperlipidemia   . Hypertension   . Hypothyroidism   . Kidney stone    right kidney  . Myocardial infarction ([place]) 01/1991   sp inferior  . Nerve compression    right leg  . Reflux   . Seasonal allergies   . Thyroid disease    hypothyroidism    Past Surgical History:  Procedure Laterality Date  . ABDOMINAL AORTAGRAM N/A 02/28/2012   Procedure: ABDOMINAL Maxcine Ham;  Surgeon: Serafina Mitchell, MD;  Location: [facility] CATH LAB;  Service: Cardiovascular;  Laterality: N/A;  . abdominal aortagram embolization  02/28/2012  . ABDOMINAL AORTIC ANEURYSM REPAIR  Sept. 2013  . CORONARY ARTERY BYPASS GRAFT  04/2009  . [place] and  2009   RCA  . EMBOLIZATION Right  02/28/2012   Procedure: EMBOLIZATION;  Surgeon: Serafina Mitchell, MD;  Location: [facility] CATH LAB;  Service: Cardiovascular;  Laterality: Right;  . IR GENERIC HISTORICAL  12/05/2016   IR RADIOLOGIST EVAL & MGMT 12/05/2016 Jacqulynn Cadet, MD [facility] INTERV RAD  . KIDNEY STONE SURGERY    . LEFT HEART CATHETERIZATION WITH CORONARY/GRAFT ANGIOGRAM N/A 01/08/2015   Procedure: LEFT HEART CATHETERIZATION WITH Beatrix Fetters;  Surgeon: Jacolyn Reedy, MD;  Location: [facility] CATH LAB;  Service: Cardiovascular;  Laterality: N/A;  . parathyroid adenoma    . PROSTATE SURGERY     rad seeds  . SPINE SURGERY    . TONSILLECTOMY      Family History  Problem Relation Age of Onset  . Heart disease Father        Heart Disease before age 51  . Heart attack Father   . Hyperlipidemia Father   . Hypertension Father   . Stroke Mother   . Deep vein thrombosis Mother   . Heart disease Sister   . Diabetes Sister   . Hyperlipidemia Sister   . Hypertension Sister   . Heart attack Sister   . Heart disease Paternal Uncle   . Diabetes Maternal Grandmother   . Diabetes Sister     Social History Social History   Tobacco Use  . Smoking status: Former Smoker    Types: Cigarettes  Last attempt to quit: 08/01/1970    Years since quitting: 47.5  . Smokeless tobacco: Never Used  Substance Use Topics  . Alcohol use: Yes    Alcohol/week: 3.6 - 5.4 oz    Types: 4 - 5 Glasses of wine, 2 - 4 Cans of beer per week    Comment: 1-3 glasses 3-4 times a week  . Drug use: No    Current Outpatient Medications  Medication Sig Dispense Refill  . acetaminophen (TYLENOL) 650 MG CR tablet Take 650 mg by mouth daily as needed for pain. Reported on 11/29/2015    . allopurinol (ZYLOPRIM) 300 MG tablet Take 300 mg by mouth every other day. Take in the evening - on even days of the month    . amLODipine (NORVASC) 10 MG tablet Take 10 mg by mouth daily.    Marland Kitchen aspirin EC 81 MG tablet Take 81 mg by mouth at bedtime.     .  Cholecalciferol (VITAMIN D3) 2000 UNITS TABS Take 2,000 Units by mouth daily.     . clonazePAM (KLONOPIN) 1 MG tablet Take 0.5 mg by mouth at bedtime.     . clopidogrel (PLAVIX) 75 MG tablet Take 75 mg by mouth daily.  12  . Coenzyme Q10 (EQL COQ10) 300 MG CAPS Take by mouth.    . esomeprazole (NEXIUM) 20 MG capsule Take 20 mg by mouth daily at 12 noon.    . fluticasone (FLONASE) 50 MCG/ACT nasal spray Place 2 sprays into both nostrils daily as needed for allergies.     . Glucose Blood (FREESTYLE TEST VI)     . ibuprofen (ADVIL,MOTRIN) 200 MG tablet Take 200 mg by mouth as needed.    Marland Kitchen levothyroxine (SYNTHROID, LEVOTHROID) 175 MCG tablet Take 175 mcg by mouth daily before breakfast.    . loratadine-pseudoephedrine (CLARITIN-D 24-HOUR) 10-240 MG per 24 hr tablet Take 1 tablet by mouth at bedtime.     . metoprolol succinate (TOPROL-XL) 100 MG 24 hr tablet Take 1 tablet (100 mg total) by mouth daily. Take with or immediately following a meal. 30 tablet 12  . Multiple Vitamin (MULTIVITAMIN WITH MINERALS) TABS Take 1 tablet by mouth at bedtime.     . Multiple Vitamins-Minerals (PRESERVISION AREDS 2) CAPS Take 1 capsule by mouth 2 (two) times daily.    Marland Kitchen olmesartan-hydrochlorothiazide (BENICAR HCT) 40-25 MG per tablet Take 1 tablet by mouth daily.     . Omega-3 Fatty Acids (FISH OIL) 1200 MG CAPS Take 1,200 mg by mouth 2 (two) times daily.    . potassium chloride SA (K-DUR,KLOR-CON) 20 MEQ tablet Take 1 tablet (20 mEq total) by mouth daily. 30 tablet 12  . rOPINIRole (REQUIP) 0.5 MG tablet Take 2 hours before bedtime. 30 tablet 2  . rosuvastatin (CRESTOR) 10 MG tablet Take 10 mg by mouth as directed. Per patient, takes Mondays & Thursdays    . saxagliptin HCl (ONGLYZA) 2.5 MG TABS tablet Take 2.5 mg by mouth daily.    Marland Kitchen spironolactone (ALDACTONE) 25 MG tablet Take 12.5 mg by mouth every evening.     . Tamsulosin HCl (FLOMAX) 0.4 MG CAPS Take 0.4 mg by mouth every other day. Take in the evening - on  even days of the month    . vitamin B-12 (CYANOCOBALAMIN) 1000 MCG tablet Take 2,500 mcg by mouth at bedtime.      No current facility-administered medications for this visit.     Allergies  Allergen Reactions  . Ace Inhibitors Cough  .  Crestor [Rosuvastatin] Other (See Comments)    Muscle aches  . Nitroglycerin Other (See Comments)    Very pronounced lowered BP with IV nitro for caths    Review of Systems  Weight stable No chest pain or palpitations No shortness of breath No abdominal pain No ankle edema No syncope or dizziness No dental complaints  BP 122/80 (BP Location: Right Arm, Patient Position: Sitting, Cuff Size: Large)   Pulse 63   Resp 18   Ht 5\' 10"  (1.778 m)   Wt 200 lb 12.8 oz (91.1 kg)   SpO2 96% Comment: RA  BMI 28.81 kg/m  Physical Exam      Exam    General- alert and comfortable    Neck- no JVD, no cervical adenopathy palpable, no carotid bruit   Lungs- clear without rales, wheezes   Cor- regular rate and rhythm, no murmur , gallop   Abdomen- soft, non-tender   Extremities - warm, non-tender, minimal edema   Neuro- oriented, appropriate, no focal weakness   Diagnostic Tests: CT scans personally reviewed showing stable minimal thoracic aortic disease.  Impression: Continue current medication and heart healthy diet and lifestyle  Plan: Plan on reviewing CTA of the thoracic abdominal aorta in approximately 1 year. Patient will be referred to [facility] for Dr. Trula Slade to review his recent abdominal CTA  Len Childs, MD [facility] [phone]

## 2018-01-25 ENCOUNTER — Encounter (HOSPITAL_COMMUNITY)
Admission: RE | Admit: 2018-01-25 | Discharge: 2018-01-25 | Disposition: A | Discharge status: Home / Self Care | Payer: Medicare Other | Source: Ambulatory Visit | Attending: Cardiology | Admitting: Cardiology

## 2018-01-25 ENCOUNTER — Encounter (HOSPITAL_COMMUNITY): Payer: Self-pay

## 2018-01-28 ENCOUNTER — Encounter (HOSPITAL_COMMUNITY): Payer: Self-pay

## 2018-01-28 ENCOUNTER — Encounter (HOSPITAL_COMMUNITY)
Admission: RE | Admit: 2018-01-28 | Discharge: 2018-01-28 | Disposition: A | Discharge status: Home / Self Care | Payer: Self-pay | Source: Ambulatory Visit | Attending: Cardiology | Admitting: Cardiology

## 2018-01-30 ENCOUNTER — Encounter (HOSPITAL_COMMUNITY): Payer: Self-pay

## 2018-01-30 ENCOUNTER — Encounter (HOSPITAL_COMMUNITY)
Admission: RE | Admit: 2018-01-30 | Discharge: 2018-01-30 | Disposition: A | Discharge status: Home / Self Care | Payer: Self-pay | Source: Ambulatory Visit | Attending: Cardiology | Admitting: Cardiology

## 2018-02-01 ENCOUNTER — Encounter (HOSPITAL_COMMUNITY): Payer: Self-pay

## 2018-02-01 ENCOUNTER — Encounter (HOSPITAL_COMMUNITY)
Admission: RE | Admit: 2018-02-01 | Discharge: 2018-02-01 | Disposition: A | Discharge status: Home / Self Care | Payer: Self-pay | Source: Ambulatory Visit | Attending: Cardiology | Admitting: Cardiology

## 2018-02-04 ENCOUNTER — Encounter (HOSPITAL_COMMUNITY)
Admission: RE | Admit: 2018-02-04 | Discharge: 2018-02-04 | Disposition: A | Discharge status: Home / Self Care | Payer: Self-pay | Source: Ambulatory Visit | Attending: Cardiology | Admitting: Cardiology

## 2018-02-04 ENCOUNTER — Encounter (HOSPITAL_COMMUNITY): Payer: Self-pay

## 2018-02-06 ENCOUNTER — Encounter (HOSPITAL_COMMUNITY): Payer: Self-pay

## 2018-02-08 ENCOUNTER — Encounter (HOSPITAL_COMMUNITY): Payer: Self-pay

## 2018-02-11 ENCOUNTER — Encounter (HOSPITAL_COMMUNITY)
Admission: RE | Admit: 2018-02-11 | Discharge: 2018-02-11 | Disposition: A | Discharge status: Home / Self Care | Payer: Self-pay | Source: Ambulatory Visit | Attending: Cardiology | Admitting: Cardiology

## 2018-02-11 ENCOUNTER — Encounter (HOSPITAL_COMMUNITY): Payer: Self-pay

## 2018-02-11 DIAGNOSIS — I712 Thoracic aortic aneurysm, without rupture: Secondary | ICD-10-CM | POA: Insufficient documentation

## 2018-02-13 ENCOUNTER — Encounter (HOSPITAL_COMMUNITY): Payer: Self-pay

## 2018-02-15 ENCOUNTER — Encounter (HOSPITAL_COMMUNITY): Payer: Self-pay

## 2018-02-15 ENCOUNTER — Encounter (HOSPITAL_COMMUNITY)
Admission: RE | Admit: 2018-02-15 | Discharge: 2018-02-15 | Disposition: A | Discharge status: Home / Self Care | Payer: Medicare Other | Source: Ambulatory Visit | Attending: Cardiology | Admitting: Cardiology

## 2018-02-18 ENCOUNTER — Other Ambulatory Visit: Payer: Self-pay

## 2018-02-18 ENCOUNTER — Ambulatory Visit: Payer: Medicare Other | Admitting: Surgery

## 2018-02-18 ENCOUNTER — Encounter (HOSPITAL_COMMUNITY)
Admission: RE | Admit: 2018-02-18 | Discharge: 2018-02-18 | Disposition: A | Discharge status: Home / Self Care | Payer: Self-pay | Source: Ambulatory Visit | Attending: Cardiology | Admitting: Cardiology

## 2018-02-18 ENCOUNTER — Encounter (HOSPITAL_COMMUNITY): Payer: Self-pay

## 2018-02-18 ENCOUNTER — Encounter: Payer: Self-pay | Admitting: Surgery

## 2018-02-18 VITALS — BP 123/73 | HR 51 | Resp 20 | Ht 70.0 in | Wt 201.4 lb

## 2018-02-18 DIAGNOSIS — I714 Abdominal aortic aneurysm, without rupture, unspecified: Secondary | ICD-10-CM

## 2018-02-18 NOTE — Progress Notes (Signed)
[facility] [place]  Patient name: Garrett MERRIWEATHER Sr. MRN: [id number] DOB: [birth year] Sex: male   REASON FOR VISIT:    Follow up  HISOTRY OF PRESENT ILLNESS:   The patient is back today for followup of his abdominal aortic aneurysm. He initially underwent coil embolization of his right hypogastric artery. He developed severe buttock claudication therefore his aneurysm repair was delayed. His symptoms of claudication gradually improved and on 08/08/2012 he underwent endovascular repair of his aneurysm. 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  He continues to remain very active.  He continues with cardiac rehab as well as walking at the [facility].  He will occasionally get right leg claudication symptoms in his thigh but these are manageable.  He has recently been diagnosed with diabetes which he is taking oral medications for.  PAST MEDICAL HISTORY:   Past Medical History:  Diagnosis Date  . AAA (abdominal aortic aneurysm) ([place])   . Arthritis    knees  . Atrial fibrillation ([place])   . CAD (coronary artery disease)   . Cancer [facility])    prostate  . GERD (gastroesophageal reflux disease)   . Gout   . Hyperlipidemia   . Hypertension   . Hypothyroidism   . Kidney stone    right kidney  . Myocardial infarction ([place]) 01/1991   sp inferior  . Nerve compression    right leg  . Reflux   . Seasonal allergies   . Thyroid disease    hypothyroidism     FAMILY HISTORY:   Family History  Problem Relation Age of Onset  . Heart disease Father        Heart Disease before age 38  . Heart attack Father   . Hyperlipidemia Father   . Hypertension Father   . Stroke Mother   . Deep vein thrombosis Mother   . Heart disease Sister   . Diabetes Sister   . Hyperlipidemia Sister   . Hypertension Sister   . Heart  attack Sister   . Heart disease Paternal Uncle   . Diabetes Maternal Grandmother   . Diabetes Sister     SOCIAL HISTORY:   Social History   Tobacco Use  . Smoking status: Former Smoker    Types: Cigarettes    Last attempt to quit: 08/01/1970    Years since quitting: 47.5  . Smokeless tobacco: Never Used  Substance Use Topics  . Alcohol use: Yes    Alcohol/week: 3.6 - 5.4 oz    Types: 4 - 5 Glasses of wine, 2 - 4 Cans of beer per week    Comment: 1-3 glasses 3-4 times a week     ALLERGIES:   Allergies  Allergen Reactions  . Ace Inhibitors Cough  . Crestor [Rosuvastatin] Other (See Comments)    Muscle aches  . Nitroglycerin Other (See Comments)    Very pronounced lowered BP with IV nitro for caths     CURRENT MEDICATIONS:   Current Outpatient Medications  Medication Sig Dispense Refill  . acetaminophen (TYLENOL) 650 MG CR tablet Take 650 mg by mouth daily as needed for pain. Reported on 11/29/2015    . allopurinol (ZYLOPRIM) 300 MG tablet Take 300 mg by mouth every other day. Take in the evening - on even days of the month    . amLODipine (NORVASC) 10 MG tablet Take 10  mg by mouth daily.    Marland Kitchen aspirin EC 81 MG tablet Take 81 mg by mouth at bedtime.     . Cholecalciferol (VITAMIN D3) 2000 UNITS TABS Take 2,000 Units by mouth daily.     . clonazePAM (KLONOPIN) 1 MG tablet Take 0.5 mg by mouth at bedtime.     . clopidogrel (PLAVIX) 75 MG tablet Take 75 mg by mouth daily.  12  . Coenzyme Q10 (EQL COQ10) 300 MG CAPS Take by mouth.    . esomeprazole (NEXIUM) 20 MG capsule Take 20 mg by mouth daily at 12 noon.    . fluticasone (FLONASE) 50 MCG/ACT nasal spray Place 2 sprays into both nostrils daily as needed for allergies.     . Glucose Blood (FREESTYLE TEST VI)     . ibuprofen (ADVIL,MOTRIN) 200 MG tablet Take 200 mg by mouth as needed.    Marland Kitchen levothyroxine (SYNTHROID, LEVOTHROID) 175 MCG tablet Take 175 mcg by mouth daily before breakfast.    . loratadine-pseudoephedrine  (CLARITIN-D 24-HOUR) 10-240 MG per 24 hr tablet Take 1 tablet by mouth at bedtime.     . metoprolol succinate (TOPROL-XL) 100 MG 24 hr tablet Take 1 tablet (100 mg total) by mouth daily. Take with or immediately following a meal. 30 tablet 12  . Multiple Vitamin (MULTIVITAMIN WITH MINERALS) TABS Take 1 tablet by mouth at bedtime.     . Multiple Vitamins-Minerals (PRESERVISION AREDS 2) CAPS Take 1 capsule by mouth 2 (two) times daily.    Marland Kitchen olmesartan-hydrochlorothiazide (BENICAR HCT) 40-25 MG per tablet Take 1 tablet by mouth daily.     . Omega-3 Fatty Acids (FISH OIL) 1200 MG CAPS Take 1,200 mg by mouth 2 (two) times daily.    . potassium chloride SA (K-DUR,KLOR-CON) 20 MEQ tablet Take 1 tablet (20 mEq total) by mouth daily. 30 tablet 12  . rOPINIRole (REQUIP) 0.5 MG tablet Take 2 hours before bedtime. 30 tablet 2  . rosuvastatin (CRESTOR) 10 MG tablet Take 10 mg by mouth as directed. Per patient, takes Mondays & Thursdays    . saxagliptin HCl (ONGLYZA) 2.5 MG TABS tablet Take 2.5 mg by mouth daily.    Marland Kitchen spironolactone (ALDACTONE) 25 MG tablet Take 12.5 mg by mouth every evening.     . Tamsulosin HCl (FLOMAX) 0.4 MG CAPS Take 0.4 mg by mouth every other day. Take in the evening - on even days of the month    . vitamin B-12 (CYANOCOBALAMIN) 1000 MCG tablet Take 2,500 mcg by mouth at bedtime.      No current facility-administered medications for this visit.     REVIEW OF SYSTEMS:   [X]  denotes positive finding, [ ]  denotes negative finding Cardiac  Comments:  Chest pain or chest pressure:    Shortness of breath upon exertion:    Short of breath when lying flat:    Irregular heart rhythm:        Vascular    Pain in calf, thigh, or hip brought on by ambulation: x   Pain in feet at night that wakes you up from your sleep:     Blood clot in your veins:    Leg swelling:         Pulmonary    Oxygen at home:    Productive cough:     Wheezing:         Neurologic    Sudden weakness in arms  or legs:     Sudden numbness in arms or legs:  Sudden onset of difficulty speaking or slurred speech:    Temporary loss of vision in one eye:     Problems with dizziness:         Gastrointestinal    Blood in stool:     Vomited blood:         Genitourinary    Burning when urinating:     Blood in urine:        Psychiatric    Major depression:         Hematologic    Bleeding problems:    Problems with blood clotting too easily:        Skin    Rashes or ulcers:        Constitutional    Fever or chills:      PHYSICAL EXAM:   Vitals:   02/18/18 1328 02/18/18 1330  BP: 123/73 123/73  Pulse: (!) 51   Resp: 20   SpO2: 96%   Weight: 201 lb 6.4 oz (91.4 kg)   Height: 5\' 10"  (1.778 m)     GENERAL: The patient is a well-nourished male, in no acute distress. The vital signs are documented above. CARDIAC: There is a regular rate and rhythm.  VASCULAR: palpable pedal pulses.  No carotid bruits PULMONARY: Non-labored respirations ABDOMEN: Soft and non-tender with normal pitched bowel sounds.  MUSCULOSKELETAL: There are no major deformities or cyanosis. NEUROLOGIC: No focal weakness or paresthesias are detected. SKIN: There are no ulcers or rashes noted. PSYCHIATRIC: The patient has a normal affect.  STUDIES:   I have reviewed his CTA with the following findings: CTA CHEST  1. Stable fusiform aneurysmal dilatation of the ascending thoracic aorta with a maximal diameter of 4.3 cm. 2. 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 3. Surgical changes of CABG with 3 vessel coronary artery bypass grafting. 4. No acute cardiopulmonary process.  CTA ABD/PELVIS  1. Stable repaired abdominal aortic aneurysm status post EVAR and coil embolization of the inferior mesenteric artery. Maximal dimensions 6.1 x 4.8 cm, unchanged. No evidence of residual type 2 endoleak. 2. 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 3. Hepatic steatosis. 4. Nonobstructing right-sided nephrolithiasis. 5. Small fat containing umbilical hernia. 6. Advanced L4-L5 degenerative disc disease. 7. Additional ancillary findings as above without significant interval change  MEDICAL ISSUES:   AAA: CT scan today shows no evidence of endoleak.  The aneurysm sac remains stable.  I will plan on follow-up imaging in one year.    Annamarie Major, MD [facility] [phone] Pager [phone]

## 2018-02-20 ENCOUNTER — Encounter (HOSPITAL_COMMUNITY): Payer: Self-pay

## 2018-02-22 ENCOUNTER — Encounter (HOSPITAL_COMMUNITY): Payer: Self-pay

## 2018-02-25 ENCOUNTER — Encounter (HOSPITAL_COMMUNITY)
Admission: RE | Admit: 2018-02-25 | Discharge: 2018-02-25 | Disposition: A | Discharge status: Home / Self Care | Payer: Self-pay | Source: Ambulatory Visit | Attending: Cardiology | Admitting: Cardiology

## 2018-02-25 ENCOUNTER — Encounter (HOSPITAL_COMMUNITY): Payer: Self-pay

## 2018-02-27 ENCOUNTER — Encounter (HOSPITAL_COMMUNITY): Payer: Self-pay

## 2018-03-01 ENCOUNTER — Encounter (HOSPITAL_COMMUNITY)
Admission: RE | Admit: 2018-03-01 | Discharge: 2018-03-01 | Disposition: A | Discharge status: Home / Self Care | Payer: Self-pay | Source: Ambulatory Visit | Attending: Cardiology | Admitting: Cardiology

## 2018-03-01 ENCOUNTER — Encounter (HOSPITAL_COMMUNITY): Payer: Self-pay

## 2018-03-02 ENCOUNTER — Other Ambulatory Visit: Payer: Self-pay | Admitting: Neurology

## 2018-03-04 ENCOUNTER — Encounter (HOSPITAL_COMMUNITY)
Admission: RE | Admit: 2018-03-04 | Discharge: 2018-03-04 | Disposition: A | Discharge status: Home / Self Care | Payer: Medicare Other | Source: Ambulatory Visit | Attending: Cardiology | Admitting: Cardiology

## 2018-03-04 ENCOUNTER — Encounter (HOSPITAL_COMMUNITY): Payer: Self-pay

## 2018-03-06 ENCOUNTER — Encounter (HOSPITAL_COMMUNITY): Payer: Self-pay

## 2018-03-08 ENCOUNTER — Ambulatory Visit: Payer: Medicare Other | Admitting: Neurology

## 2018-03-08 ENCOUNTER — Encounter (HOSPITAL_COMMUNITY): Payer: Self-pay

## 2018-03-08 ENCOUNTER — Encounter (HOSPITAL_COMMUNITY)
Admission: RE | Admit: 2018-03-08 | Discharge: 2018-03-08 | Disposition: A | Discharge status: Home / Self Care | Payer: Medicare Other | Source: Ambulatory Visit | Attending: Cardiology | Admitting: Cardiology

## 2018-03-08 ENCOUNTER — Encounter: Payer: Self-pay | Admitting: Neurology

## 2018-03-08 VITALS — BP 102/68 | HR 68 | Ht 70.0 in | Wt 200.0 lb

## 2018-03-08 DIAGNOSIS — G2581 Restless legs syndrome: Secondary | ICD-10-CM | POA: Diagnosis not present

## 2018-03-08 NOTE — Progress Notes (Signed)
NEUROLOGY FOLLOW UP OFFICE NOTE  Garrett CREGG Sr. [id number]  HISTORY OF PRESENT ILLNESS: Garrett Jordan, Sr is a 77 year old male with hypertension, hypothyroidism, gout, diabetes, chronic low back pain, AAA, BPH and B12 deficiency who follows up for restless leg syndrome.   UPDATE: In January, he was started on ropinirole 0.5mg  two hours before bedtime.  It initially worked but symptoms returned after a month and he started having more severe symptoms during the day.  He returned to using clonazepam and daytime symptoms became minimal but still minimal breakthrough symptoms during the night.  About 2 to 3 nights a week he wakes up and needs to walk for about 10 minutes.   HISTORY: He has had restless leg since childhood.  Although he sometimes notices it during the day, he mostly experiences it at night in bed.  He describes it as a "tickling" sensation in his legs that cause him to move his legs around.  It mostly affects his right leg.  He currently takes   His mother had restless leg.  His 2 sisters have restless leg.   Ferritin level was 270.  He also takes B12 supplementation.  He takes Crestor twice a week for hyperlipidemia.  He is not on an SSRI.  Past treatment:  gabapentin 300mg  at bedtime (ineffective and it kept him awake at night), clonazepam 0.5mg  at bedtime (minimally effective, still woke up at night.  1mg  is more effective but causes daytime drowsiness)  PAST MEDICAL HISTORY: Past Medical History:  Diagnosis Date  . AAA (abdominal aortic aneurysm) ([place])   . Arthritis    knees  . Atrial fibrillation ([place])   . CAD (coronary artery disease)   . Cancer [facility])    prostate  . GERD (gastroesophageal reflux disease)   . Gout   . Hyperlipidemia   . Hypertension   . Hypothyroidism   . Kidney stone    right kidney  . Myocardial infarction ([place]) 01/1991   sp inferior  . Nerve compression    right leg  . Reflux   . Seasonal allergies   . Thyroid disease    hypothyroidism    MEDICATIONS: Current Outpatient Medications on File Prior to Visit  Medication Sig Dispense Refill  . acetaminophen (TYLENOL) 650 MG CR tablet Take 650 mg by mouth daily as needed for pain. Reported on 11/29/2015    . allopurinol (ZYLOPRIM) 300 MG tablet Take 300 mg by mouth every other day. Take in the evening - on even days of the month    . amLODipine (NORVASC) 10 MG tablet Take 10 mg by mouth daily.    Marland Kitchen aspirin EC 81 MG tablet Take 81 mg by mouth at bedtime.     . Cholecalciferol (VITAMIN D3) 2000 UNITS TABS Take 2,000 Units by mouth daily.     . clonazePAM (KLONOPIN) 1 MG tablet Take 0.5 mg by mouth at bedtime.     . clopidogrel (PLAVIX) 75 MG tablet Take 75 mg by mouth daily.  12  . Coenzyme Q10 (EQL COQ10) 300 MG CAPS Take by mouth.    . esomeprazole (NEXIUM) 20 MG capsule Take 20 mg by mouth daily at 12 noon.    . fluticasone (FLONASE) 50 MCG/ACT nasal spray Place 2 sprays into both nostrils daily as needed for allergies.     . Glucose Blood (FREESTYLE TEST VI)     . ibuprofen (ADVIL,MOTRIN) 200 MG tablet Take 200 mg by mouth as needed.    Marland Kitchen  levothyroxine (SYNTHROID, LEVOTHROID) 175 MCG tablet Take 175 mcg by mouth daily before breakfast.    . loratadine-pseudoephedrine (CLARITIN-D 24-HOUR) 10-240 MG per 24 hr tablet Take 1 tablet by mouth at bedtime.     . metoprolol succinate (TOPROL-XL) 100 MG 24 hr tablet Take 1 tablet (100 mg total) by mouth daily. Take with or immediately following a meal. 30 tablet 12  . Multiple Vitamin (MULTIVITAMIN WITH MINERALS) TABS Take 1 tablet by mouth at bedtime.     . Multiple Vitamins-Minerals (PRESERVISION AREDS 2) CAPS Take 1 capsule by mouth 2 (two) times daily.    Marland Kitchen olmesartan-hydrochlorothiazide (BENICAR HCT) 40-25 MG per tablet Take 1 tablet by mouth daily.     . Omega-3 Fatty Acids (FISH OIL) 1200 MG CAPS Take 1,200 mg by mouth 2 (two) times daily.    . potassium chloride SA (K-DUR,KLOR-CON) 20 MEQ tablet Take 1 tablet (20  mEq total) by mouth daily. 30 tablet 12  . rOPINIRole (REQUIP) 0.5 MG tablet Take 2 hours before bedtime. (Patient not taking: Reported on 03/08/2018) 30 tablet 2  . rosuvastatin (CRESTOR) 10 MG tablet Take 10 mg by mouth as directed. Per patient, takes Mondays & Thursdays    . saxagliptin HCl (ONGLYZA) 2.5 MG TABS tablet Take 2.5 mg by mouth daily.    Marland Kitchen spironolactone (ALDACTONE) 25 MG tablet Take 12.5 mg by mouth every evening.     . Tamsulosin HCl (FLOMAX) 0.4 MG CAPS Take 0.4 mg by mouth every other day. Take in the evening - on even days of the month    . triamcinolone cream (KENALOG) 0.1 % Apply 1 application topically as needed.  2  . vitamin B-12 (CYANOCOBALAMIN) 1000 MCG tablet Take 2,500 mcg by mouth at bedtime.      No current facility-administered medications on file prior to visit.     ALLERGIES: Allergies  Allergen Reactions  . Ace Inhibitors Cough  . Crestor [Rosuvastatin] Other (See Comments)    Muscle aches  . Nitroglycerin Other (See Comments)    Very pronounced lowered BP with IV nitro for caths    FAMILY HISTORY: Family History  Problem Relation Age of Onset  . Heart disease Father        Heart Disease before age 75  . Heart attack Father   . Hyperlipidemia Father   . Hypertension Father   . Stroke Mother   . Deep vein thrombosis Mother   . Heart disease Sister   . Diabetes Sister   . Hyperlipidemia Sister   . Hypertension Sister   . Heart attack Sister   . Heart disease Paternal Uncle   . Diabetes Maternal Grandmother   . Diabetes Sister     SOCIAL HISTORY: Social History   Socioeconomic History  . Marital status: Divorced    Spouse name: Not on file  . Number of children: Not on file  . Years of education: Not on file  . Highest education level: Not on file  Occupational History  . Not on file  Social Needs  . Financial resource strain: Not on file  . Food insecurity:    Worry: Not on file    Inability: Not on file  . Transportation  needs:    Medical: Not on file    Non-medical: Not on file  Tobacco Use  . Smoking status: Former Smoker    Types: Cigarettes    Last attempt to quit: 08/01/1970    Years since quitting: 47.6  . Smokeless tobacco: Never Used  Substance and Sexual Activity  . Alcohol use: Yes    Alcohol/week: 3.6 - 5.4 oz    Types: 4 - 5 Glasses of wine, 2 - 4 Cans of beer per week    Comment: 1-3 glasses 3-4 times a week  . Drug use: No  . Sexual activity: Not on file  Lifestyle  . Physical activity:    Days per week: Not on file    Minutes per session: Not on file  . Stress: Not on file  Relationships  . Social connections:    Talks on phone: Not on file    Gets together: Not on file    Attends religious service: Not on file    Active member of club or organization: Not on file    Attends meetings of clubs or organizations: Not on file    Relationship status: Not on file  . Intimate partner violence:    Fear of current or ex partner: Not on file    Emotionally abused: Not on file    Physically abused: Not on file    Forced sexual activity: Not on file  Other Topics Concern  . Not on file  Social History Narrative  . Not on file    REVIEW OF SYSTEMS: Constitutional: No fevers, chills, or sweats, no generalized fatigue, change in appetite Eyes: No visual changes, double vision, eye pain Ear, nose and throat: No hearing loss, ear pain, nasal congestion, sore throat Cardiovascular: No chest pain, palpitations Respiratory:  No shortness of breath at rest or with exertion, wheezes GastrointestinaI: No nausea, vomiting, diarrhea, abdominal pain, fecal incontinence Genitourinary:  No dysuria, urinary retention or frequency Musculoskeletal:  No neck pain, back pain Integumentary: No rash, pruritus, skin lesions Neurological: as above Psychiatric: No depression, insomnia, anxiety Endocrine: No palpitations, fatigue, diaphoresis, mood swings, change in appetite, change in weight, increased  thirst Hematologic/Lymphatic:  No purpura, petechiae. Allergic/Immunologic: no itchy/runny eyes, nasal congestion, recent allergic reactions, rashes  PHYSICAL EXAM: Vitals:   03/08/18 1103  BP: 102/68  Pulse: 68  SpO2: 95%   General: No acute distress.  Patient appears well-groomed.   Head:  Normocephalic/atraumatic Eyes:  Fundi examined but not visualized Neck: supple, no paraspinal tenderness, full range of motion Heart:  Regular rate and rhythm Lungs:  Clear to auscultation bilaterally Back: No paraspinal tenderness Neurological Exam: alert and oriented to person, place, and time. Attention span and concentration intact, recent and remote memory intact, fund of knowledge intact.  Speech fluent and not dysarthric, language intact.  CN II-XII intact. Bulk and tone normal, muscle strength 5/5 throughout.  Sensation to light touch  intact.  Deep tendon reflexes 2+ throughout.  Finger to nose testing intact.  Gait normal, Romberg negative.  IMPRESSION: Restless leg syndrome  PLAN: 1.  He will give ropinirole another chance, increasing dose to 1mg  about 2 hours prior to bedtime.  If ineffective or continues to cause increased daytime symptoms, he will contact me and we will either adjust dosing or change to another medication. 2.  Follow up in 3 months.  16 minutes spent face to face with patient, over 50% spent discussing management.  Metta Clines, DO  CC:  Antony Contras, MD

## 2018-03-08 NOTE — Patient Instructions (Signed)
Let's give ropinirole another chance.  Increase dose to 1mg  two hours prior to bedtime.   If you still experience worsening symptoms during the day, contact me and we can either make further adjustments to dose or consider change in medication  Follow up in 3 months.

## 2018-03-11 ENCOUNTER — Encounter (HOSPITAL_COMMUNITY): Payer: Self-pay

## 2018-03-11 ENCOUNTER — Encounter (HOSPITAL_COMMUNITY)
Admission: RE | Admit: 2018-03-11 | Discharge: 2018-03-11 | Disposition: A | Discharge status: Home / Self Care | Payer: Self-pay | Source: Ambulatory Visit | Attending: Cardiology | Admitting: Cardiology

## 2018-03-13 ENCOUNTER — Encounter (HOSPITAL_COMMUNITY): Payer: Self-pay

## 2018-03-13 DIAGNOSIS — I712 Thoracic aortic aneurysm, without rupture: Secondary | ICD-10-CM | POA: Insufficient documentation

## 2018-03-15 ENCOUNTER — Encounter (HOSPITAL_COMMUNITY)
Admission: RE | Admit: 2018-03-15 | Discharge: 2018-03-15 | Disposition: A | Discharge status: Home / Self Care | Payer: Self-pay | Source: Ambulatory Visit | Attending: Cardiology | Admitting: Cardiology

## 2018-03-18 ENCOUNTER — Encounter (HOSPITAL_COMMUNITY): Payer: Self-pay

## 2018-03-20 ENCOUNTER — Encounter (HOSPITAL_COMMUNITY)
Admission: RE | Admit: 2018-03-20 | Discharge: 2018-03-20 | Disposition: A | Discharge status: Home / Self Care | Payer: Self-pay | Source: Ambulatory Visit | Attending: Cardiology | Admitting: Cardiology

## 2018-03-22 ENCOUNTER — Encounter (HOSPITAL_COMMUNITY)
Admission: RE | Admit: 2018-03-22 | Discharge: 2018-03-22 | Disposition: A | Discharge status: Home / Self Care | Payer: Self-pay | Source: Ambulatory Visit | Attending: Cardiology | Admitting: Cardiology

## 2018-03-25 ENCOUNTER — Encounter (HOSPITAL_COMMUNITY)
Admission: RE | Admit: 2018-03-25 | Discharge: 2018-03-25 | Disposition: A | Discharge status: Home / Self Care | Payer: Self-pay | Source: Ambulatory Visit | Attending: Cardiology | Admitting: Cardiology

## 2018-03-27 ENCOUNTER — Encounter (HOSPITAL_COMMUNITY)
Admission: RE | Admit: 2018-03-27 | Discharge: 2018-03-27 | Disposition: A | Discharge status: Home / Self Care | Payer: Self-pay | Source: Ambulatory Visit | Attending: Cardiology | Admitting: Cardiology

## 2018-03-28 ENCOUNTER — Ambulatory Visit (INDEPENDENT_AMBULATORY_CARE_PROVIDER_SITE_OTHER): Payer: Medicare Other

## 2018-03-28 ENCOUNTER — Encounter: Payer: Self-pay | Admitting: Podiatry

## 2018-03-28 ENCOUNTER — Ambulatory Visit: Payer: Medicare Other | Admitting: Podiatry

## 2018-03-28 DIAGNOSIS — I739 Peripheral vascular disease, unspecified: Secondary | ICD-10-CM

## 2018-03-28 DIAGNOSIS — M779 Enthesopathy, unspecified: Secondary | ICD-10-CM

## 2018-03-28 DIAGNOSIS — M7751 Other enthesopathy of right foot: Secondary | ICD-10-CM | POA: Diagnosis not present

## 2018-03-28 DIAGNOSIS — M792 Neuralgia and neuritis, unspecified: Secondary | ICD-10-CM

## 2018-03-28 DIAGNOSIS — S9030XA Contusion of unspecified foot, initial encounter: Secondary | ICD-10-CM

## 2018-03-28 NOTE — Progress Notes (Signed)
[id number]

## 2018-03-29 ENCOUNTER — Encounter (HOSPITAL_COMMUNITY): Payer: Self-pay

## 2018-03-31 NOTE — Progress Notes (Signed)
Subjective:   Patient ID: Garrett Grove., male   DOB: 77 y.o.   MRN: [id number]   HPI 77 year old male presents the office today for concerns of bruising to his toes on his right foot he points on the second and third toes.  He states he notices after he was wearing shoes and doing a lot of yard work.  Denies any recent injury or trauma.  He has had some numbness to his toes and on top of his foot but this is been ongoing for several years.  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  He states that this was back in 2012 and since then he has had some numbness to the top of his foot.  This is not changed.  Is not gotten any worse but is not improved either.  He is concerned about his circulation.  He has no other concerns.  Review of Systems  All other systems reviewed and are negative.  Past Medical History:  Diagnosis Date  . AAA (abdominal aortic aneurysm) ([place])   . Arthritis    knees  . Atrial fibrillation ([place])   . CAD (coronary artery disease)   . Cancer [facility])    prostate  . GERD (gastroesophageal reflux disease)   . Gout   . Hyperlipidemia   . Hypertension   . Hypothyroidism   . Kidney stone    right kidney  . Myocardial infarction ([place]) 01/1991   sp inferior  . Nerve compression    right leg  . Reflux   . Seasonal allergies   . Thyroid disease    hypothyroidism    Past Surgical History:  Procedure Laterality Date  . ABDOMINAL AORTAGRAM N/A 02/28/2012   Procedure: ABDOMINAL Maxcine Ham;  Surgeon: Serafina Mitchell, MD;  Location: [facility] CATH LAB;  Service: Cardiovascular;  Laterality: N/A;  . abdominal aortagram embolization  02/28/2012  . ABDOMINAL AORTIC ANEURYSM REPAIR  Sept. 2013  . CORONARY ARTERY BYPASS GRAFT  04/2009  . [place] and  2009   RCA  . EMBOLIZATION Right 02/28/2012   Procedure: EMBOLIZATION;  Surgeon: Serafina Mitchell, MD;  Location: [facility] CATH LAB;  Service:  Cardiovascular;  Laterality: Right;  . IR GENERIC HISTORICAL  12/05/2016   IR RADIOLOGIST EVAL & MGMT 12/05/2016 Jacqulynn Cadet, MD [facility] INTERV RAD  . KIDNEY STONE SURGERY    . LEFT HEART CATHETERIZATION WITH CORONARY/GRAFT ANGIOGRAM N/A 01/08/2015   Procedure: LEFT HEART CATHETERIZATION WITH Beatrix Fetters;  Surgeon: Jacolyn Reedy, MD;  Location: [facility] CATH LAB;  Service: Cardiovascular;  Laterality: N/A;  . parathyroid adenoma    . PROSTATE SURGERY     rad seeds  . SPINE SURGERY    . TONSILLECTOMY       Current Outpatient Medications:  .  acetaminophen (TYLENOL) 650 MG CR tablet, Take 650 mg by mouth daily as needed for pain. Reported on 11/29/2015, Disp: , Rfl:  .  allopurinol (ZYLOPRIM) 300 MG tablet, Take 300 mg by mouth every other day. Take in the evening - on even days of the month, Disp: , Rfl:  .  amLODipine (NORVASC) 10 MG tablet, Take 10 mg by mouth daily., Disp: , Rfl:  .  aspirin EC 81 MG tablet, Take 81 mg by mouth at bedtime. , Disp: , Rfl:  .  Cholecalciferol (VITAMIN D3) 2000 UNITS TABS, Take 2,000 Units by mouth daily. , Disp: ,  Rfl:  .  clonazePAM (KLONOPIN) 1 MG tablet, Take 0.5 mg by mouth at bedtime. , Disp: , Rfl:  .  clopidogrel (PLAVIX) 75 MG tablet, Take 75 mg by mouth daily., Disp: , Rfl: 12 .  Coenzyme Q10 (EQL COQ10) 300 MG CAPS, Take by mouth., Disp: , Rfl:  .  esomeprazole (NEXIUM) 20 MG capsule, Take 20 mg by mouth daily at 12 noon., Disp: , Rfl:  .  fluticasone (FLONASE) 50 MCG/ACT nasal spray, Place 2 sprays into both nostrils daily as needed for allergies. , Disp: , Rfl:  .  Glucose Blood (FREESTYLE TEST VI), , Disp: , Rfl:  .  ibuprofen (ADVIL,MOTRIN) 200 MG tablet, Take 200 mg by mouth as needed., Disp: , Rfl:  .  levothyroxine (SYNTHROID, LEVOTHROID) 175 MCG tablet, Take 175 mcg by mouth daily before breakfast., Disp: , Rfl:  .  loratadine-pseudoephedrine (CLARITIN-D 24-HOUR) 10-240 MG per 24 hr tablet, Take 1 tablet by mouth at bedtime. ,  Disp: , Rfl:  .  metoprolol succinate (TOPROL-XL) 100 MG 24 hr tablet, Take 1 tablet (100 mg total) by mouth daily. Take with or immediately following a meal., Disp: 30 tablet, Rfl: 12 .  Multiple Vitamin (MULTIVITAMIN WITH MINERALS) TABS, Take 1 tablet by mouth at bedtime. , Disp: , Rfl:  .  Multiple Vitamins-Minerals (PRESERVISION AREDS 2) CAPS, Take 1 capsule by mouth 2 (two) times daily., Disp: , Rfl:  .  olmesartan-hydrochlorothiazide (BENICAR HCT) 40-25 MG per tablet, Take 1 tablet by mouth daily. , Disp: , Rfl:  .  Omega-3 Fatty Acids (FISH OIL) 1200 MG CAPS, Take 1,200 mg by mouth 2 (two) times daily., Disp: , Rfl:  .  potassium chloride SA (K-DUR,KLOR-CON) 20 MEQ tablet, Take 1 tablet (20 mEq total) by mouth daily., Disp: 30 tablet, Rfl: 12 .  rOPINIRole (REQUIP) 0.5 MG tablet, Take 2 hours before bedtime. (Patient not taking: Reported on 03/08/2018), Disp: 30 tablet, Rfl: 2 .  rosuvastatin (CRESTOR) 10 MG tablet, Take 10 mg by mouth as directed. Per patient, takes Mondays & Thursdays, Disp: , Rfl:  .  saxagliptin HCl (ONGLYZA) 2.5 MG TABS tablet, Take 2.5 mg by mouth daily., Disp: , Rfl:  .  spironolactone (ALDACTONE) 25 MG tablet, Take 12.5 mg by mouth every evening. , Disp: , Rfl:  .  Tamsulosin HCl (FLOMAX) 0.4 MG CAPS, Take 0.4 mg by mouth every other day. Take in the evening - on even days of the month, Disp: , Rfl:  .  triamcinolone cream (KENALOG) 0.1 %, Apply 1 application topically as needed., Disp: , Rfl: 2 .  vitamin B-12 (CYANOCOBALAMIN) 1000 MCG tablet, Take 2,500 mcg by mouth at bedtime. , Disp: , Rfl:   Allergies  Allergen Reactions  . Ace Inhibitors Cough  . Crestor [Rosuvastatin] Other (See Comments)    Muscle aches  . Nitroglycerin Other (See Comments)    Very pronounced lowered BP with IV nitro for caths        Objective:  Physical Exam  General: AAO x3, NAD  Dermatological:  there is minimal ecchymosis present on the right foot on the second and third  interspace dorsally.  There is no area of tenderness identified at this area.  There is no other areas of ecchymosis present and there is no bruising identified otherwise.  There is no open lesions.  There is no erythema associated with it.    Vascular: Dorsalis Pedis artery and Posterior Tibial artery pedal pulses are 2/4 bilateral with immedate capillary fill  time.here is no pain with calf compression, swelling, warmth, erythema.    Neruologic: There is mild decrease in sensation to the dorsal aspect of the right foot and there is subjective numbness and tingling to the top of the foot into the toes on the right foot but this is unchanged since 2012.  Musculoskeletal: There is no area of tenderness identified today.  No gross boney pedal deformities bilateral. No pain, crepitus, or limitation noted with foot and ankle range of motion bilateral. Muscular strength 5/5 in all groups tested bilateral.  Gait: Unassisted, Nonantalgic.       Assessment:   Neuritis symptoms right foot with ecchymosis although improving    Plan:  -Treatment options discussed including all alternatives, risks, and complications -Etiology of symptoms were discussed -X-rays were obtained and reviewed with the patient.  There is no definitive evidence of acute fracture or stress fracture identified today. -Although he has palpable pulses today I did an ABI in the office just to ensure healing.  The left foot was 1.39 the right was 1.31.  I do think that he has adequate circulation.  The bruising did appear after he was doing a lot more work in the yard and this is gotten better over the last couple weeks.  I think that given his blood thinners that he may have had some injury and this is gotten better.  We will continue to monitor and does not completely resolve the next couple weeks and let me know or sooner if any issues are to arise.  In regards to the neuritis symptoms I think this will be about the same as is been  ongoing for several years without any improvement I do not expect it to completely resolve.  I discussed this with him.  Trula Slade DPM

## 2018-04-01 ENCOUNTER — Encounter (HOSPITAL_COMMUNITY)
Admission: RE | Admit: 2018-04-01 | Discharge: 2018-04-01 | Disposition: A | Discharge status: Home / Self Care | Payer: Self-pay | Source: Ambulatory Visit | Attending: Cardiology | Admitting: Cardiology

## 2018-04-03 ENCOUNTER — Encounter (HOSPITAL_COMMUNITY)
Admission: RE | Admit: 2018-04-03 | Discharge: 2018-04-03 | Disposition: A | Discharge status: Home / Self Care | Payer: Self-pay | Source: Ambulatory Visit | Attending: Cardiology | Admitting: Cardiology

## 2018-04-05 ENCOUNTER — Encounter (HOSPITAL_COMMUNITY): Payer: Self-pay

## 2018-04-10 ENCOUNTER — Encounter (HOSPITAL_COMMUNITY)
Admission: RE | Admit: 2018-04-10 | Discharge: 2018-04-10 | Disposition: A | Discharge status: Home / Self Care | Payer: Self-pay | Source: Ambulatory Visit | Attending: Cardiology | Admitting: Cardiology

## 2018-04-11 ENCOUNTER — Ambulatory Visit: Payer: Medicare Other | Admitting: Podiatry

## 2018-04-12 ENCOUNTER — Encounter (HOSPITAL_COMMUNITY): Payer: Self-pay

## 2018-04-15 ENCOUNTER — Encounter (HOSPITAL_COMMUNITY)
Admission: RE | Admit: 2018-04-15 | Discharge: 2018-04-15 | Disposition: A | Discharge status: Home / Self Care | Payer: Self-pay | Source: Ambulatory Visit | Attending: Cardiology | Admitting: Cardiology

## 2018-04-15 DIAGNOSIS — I712 Thoracic aortic aneurysm, without rupture: Secondary | ICD-10-CM | POA: Insufficient documentation

## 2018-04-17 ENCOUNTER — Encounter (HOSPITAL_COMMUNITY)
Admission: RE | Admit: 2018-04-17 | Discharge: 2018-04-17 | Disposition: A | Discharge status: Home / Self Care | Payer: Self-pay | Source: Ambulatory Visit | Attending: Cardiology | Admitting: Cardiology

## 2018-04-19 ENCOUNTER — Encounter (HOSPITAL_COMMUNITY): Payer: Self-pay

## 2018-04-22 ENCOUNTER — Encounter (HOSPITAL_COMMUNITY): Payer: Self-pay

## 2018-04-24 ENCOUNTER — Encounter (HOSPITAL_COMMUNITY)
Admission: RE | Admit: 2018-04-24 | Discharge: 2018-04-24 | Disposition: A | Discharge status: Home / Self Care | Payer: Self-pay | Source: Ambulatory Visit | Attending: Cardiology | Admitting: Cardiology

## 2018-04-25 ENCOUNTER — Encounter: Payer: Self-pay | Admitting: Neurology

## 2018-04-26 ENCOUNTER — Encounter (HOSPITAL_COMMUNITY): Payer: Self-pay

## 2018-04-29 ENCOUNTER — Encounter (HOSPITAL_COMMUNITY)
Admission: RE | Admit: 2018-04-29 | Discharge: 2018-04-29 | Disposition: A | Discharge status: Home / Self Care | Payer: Self-pay | Source: Ambulatory Visit | Attending: Cardiology | Admitting: Cardiology

## 2018-05-01 ENCOUNTER — Encounter (HOSPITAL_COMMUNITY): Payer: Self-pay

## 2018-05-03 ENCOUNTER — Encounter (HOSPITAL_COMMUNITY)
Admission: RE | Admit: 2018-05-03 | Discharge: 2018-05-03 | Disposition: A | Discharge status: Home / Self Care | Payer: Self-pay | Source: Ambulatory Visit | Attending: Cardiology | Admitting: Cardiology

## 2018-05-06 ENCOUNTER — Encounter (HOSPITAL_COMMUNITY)
Admission: RE | Admit: 2018-05-06 | Discharge: 2018-05-06 | Disposition: A | Discharge status: Home / Self Care | Payer: Self-pay | Source: Ambulatory Visit | Attending: Cardiology | Admitting: Cardiology

## 2018-05-08 ENCOUNTER — Encounter (HOSPITAL_COMMUNITY)
Admission: RE | Admit: 2018-05-08 | Discharge: 2018-05-08 | Disposition: A | Discharge status: Home / Self Care | Payer: Medicare Other | Source: Ambulatory Visit | Attending: Cardiology | Admitting: Cardiology

## 2018-05-10 ENCOUNTER — Encounter (HOSPITAL_COMMUNITY)
Admission: RE | Admit: 2018-05-10 | Discharge: 2018-05-10 | Disposition: A | Discharge status: Home / Self Care | Payer: Self-pay | Source: Ambulatory Visit | Attending: Cardiology | Admitting: Cardiology

## 2018-05-13 ENCOUNTER — Encounter (HOSPITAL_COMMUNITY)
Admission: RE | Admit: 2018-05-13 | Discharge: 2018-05-13 | Disposition: A | Discharge status: Home / Self Care | Payer: Self-pay | Source: Ambulatory Visit | Attending: Cardiology | Admitting: Cardiology

## 2018-05-13 DIAGNOSIS — I712 Thoracic aortic aneurysm, without rupture: Secondary | ICD-10-CM | POA: Insufficient documentation

## 2018-05-15 ENCOUNTER — Encounter (HOSPITAL_COMMUNITY)
Admission: RE | Admit: 2018-05-15 | Discharge: 2018-05-15 | Disposition: A | Discharge status: Home / Self Care | Payer: Self-pay | Source: Ambulatory Visit | Attending: Cardiology | Admitting: Cardiology

## 2018-05-17 ENCOUNTER — Encounter (HOSPITAL_COMMUNITY): Payer: Self-pay

## 2018-05-20 ENCOUNTER — Encounter (HOSPITAL_COMMUNITY)
Admission: RE | Admit: 2018-05-20 | Discharge: 2018-05-20 | Disposition: A | Discharge status: Home / Self Care | Payer: Self-pay | Source: Ambulatory Visit | Attending: Cardiology | Admitting: Cardiology

## 2018-05-22 ENCOUNTER — Encounter (HOSPITAL_COMMUNITY)
Admission: RE | Admit: 2018-05-22 | Discharge: 2018-05-22 | Disposition: A | Discharge status: Home / Self Care | Payer: Self-pay | Source: Ambulatory Visit | Attending: Cardiology | Admitting: Cardiology

## 2018-05-24 ENCOUNTER — Encounter (HOSPITAL_COMMUNITY): Payer: Self-pay

## 2018-05-27 ENCOUNTER — Encounter (HOSPITAL_COMMUNITY)
Admission: RE | Admit: 2018-05-27 | Discharge: 2018-05-27 | Disposition: A | Discharge status: Home / Self Care | Payer: Medicare Other | Source: Ambulatory Visit | Attending: Cardiology | Admitting: Cardiology

## 2018-05-29 ENCOUNTER — Encounter (HOSPITAL_COMMUNITY)
Admission: RE | Admit: 2018-05-29 | Discharge: 2018-05-29 | Disposition: A | Discharge status: Home / Self Care | Payer: Self-pay | Source: Ambulatory Visit | Attending: Cardiology | Admitting: Cardiology

## 2018-05-31 ENCOUNTER — Encounter (HOSPITAL_COMMUNITY): Payer: Self-pay

## 2018-06-03 ENCOUNTER — Encounter (HOSPITAL_COMMUNITY): Admission: RE | Admit: 2018-06-03 | Payer: Medicare Other | Source: Ambulatory Visit

## 2018-06-05 ENCOUNTER — Encounter (HOSPITAL_COMMUNITY): Payer: Self-pay

## 2018-06-07 ENCOUNTER — Encounter (HOSPITAL_COMMUNITY): Payer: Self-pay

## 2018-06-10 ENCOUNTER — Encounter (HOSPITAL_COMMUNITY)
Admission: RE | Admit: 2018-06-10 | Discharge: 2018-06-10 | Disposition: A | Discharge status: Home / Self Care | Payer: Self-pay | Source: Ambulatory Visit | Attending: Cardiology | Admitting: Cardiology

## 2018-06-12 ENCOUNTER — Encounter (HOSPITAL_COMMUNITY)
Admission: RE | Admit: 2018-06-12 | Discharge: 2018-06-12 | Disposition: A | Discharge status: Home / Self Care | Payer: Self-pay | Source: Ambulatory Visit | Attending: Cardiology | Admitting: Cardiology

## 2018-06-14 ENCOUNTER — Encounter: Payer: Self-pay | Admitting: Neurology

## 2018-06-14 ENCOUNTER — Encounter (HOSPITAL_COMMUNITY): Payer: Self-pay

## 2018-06-14 ENCOUNTER — Ambulatory Visit (INDEPENDENT_AMBULATORY_CARE_PROVIDER_SITE_OTHER): Payer: Medicare Other | Admitting: Neurology

## 2018-06-14 VITALS — BP 134/70 | HR 61 | Ht 70.0 in | Wt 202.0 lb

## 2018-06-14 DIAGNOSIS — G609 Hereditary and idiopathic neuropathy, unspecified: Secondary | ICD-10-CM

## 2018-06-14 DIAGNOSIS — G2581 Restless legs syndrome: Secondary | ICD-10-CM | POA: Diagnosis not present

## 2018-06-14 DIAGNOSIS — I712 Thoracic aortic aneurysm, without rupture: Secondary | ICD-10-CM | POA: Insufficient documentation

## 2018-06-14 MED ORDER — PREGABALIN 50 MG PO CAPS: 50.0000 mg | ORAL_CAPSULE | Freq: Every day | ORAL | 0 refills | Status: DC

## 2018-06-14 NOTE — Progress Notes (Signed)
NEUROLOGY FOLLOW UP OFFICE NOTE  Garrett STOFFERS Garrett Jordan. [id number]  HISTORY OF PRESENT ILLNESS: Garrett Jordan, Garrett Jordan is a 77 year old male with hypertension, hypothyroidism, gout, diabetes, chronic low back pain, AAA, BPH and B12 deficiency who follows up for restless leg syndrome.   UPDATE: Ropinirole was discontinued as it worsened his daytime restless leg symptoms.  In June I suggested switching to pramipexole but he deferred as he wished to avoid dopamine agonists due to potential side effects (hallucinations).  He is back on clonazepam 0.5 to 1mg  at bedtime with variable efficacy.   HISTORY: He has had restless leg since childhood.  Although he sometimes notices it during the day, he mostly experiences it at night in bed.  He describes it as a "tickling" sensation in his legs that cause him to move his legs around.  It mostly affects his right leg.  He currently takes   He also has some tingling and nerve discomfort in the feet.   His mother had restless leg.  His 2 sisters have restless leg.   Ferritin level was 270.  He also takes B12 supplementation.  He takes Crestor twice a week for hyperlipidemia.  He is not on an SSRI.   Past treatment:  gabapentin 300mg  at bedtime (ineffective and it kept him awake at night), clonazepam 0.5mg  at bedtime (minimally effective, still woke up at night.  1mg  is more effective but causes daytime drowsiness), ropinirole 1mg  (ineffective, worsened daytime symptoms)  PAST MEDICAL HISTORY: Past Medical History:  Diagnosis Date  . AAA (abdominal aortic aneurysm) ([place])   . Arthritis    knees  . Atrial fibrillation ([place])   . CAD (coronary artery disease)   . Cancer [facility])    prostate  . GERD (gastroesophageal reflux disease)   . Gout   . Hyperlipidemia   . Hypertension   . Hypothyroidism   . Kidney stone    right kidney  . Myocardial infarction ([place]) 01/1991   sp inferior  . Nerve compression    right leg  . Reflux   . Seasonal allergies   .  Thyroid disease    hypothyroidism    MEDICATIONS: Current Outpatient Medications on File Prior to Visit  Medication Sig Dispense Refill  . acetaminophen (TYLENOL) 650 MG CR tablet Take 650 mg by mouth daily as needed for pain. Reported on 11/29/2015    . allopurinol (ZYLOPRIM) 300 MG tablet Take 300 mg by mouth every other day. Take in the evening - on even days of the month    . amLODipine (NORVASC) 10 MG tablet Take 10 mg by mouth daily.    Marland Kitchen aspirin EC 81 MG tablet Take 81 mg by mouth at bedtime.     . Cholecalciferol (VITAMIN D3) 2000 UNITS TABS Take 2,000 Units by mouth daily.     . clonazePAM (KLONOPIN) 1 MG tablet Take 0.5 mg by mouth at bedtime.     . clopidogrel (PLAVIX) 75 MG tablet Take 75 mg by mouth daily.  12  . Coenzyme Q10 (EQL COQ10) 300 MG CAPS Take by mouth.    . esomeprazole (NEXIUM) 20 MG capsule Take 20 mg by mouth daily at 12 noon.    . fluticasone (FLONASE) 50 MCG/ACT nasal spray Place 2 sprays into both nostrils daily as needed for allergies.     . Glucose Blood (FREESTYLE TEST VI)     . ibuprofen (ADVIL,MOTRIN) 200 MG tablet Take 200 mg by mouth as needed.    Marland Kitchen  levothyroxine (SYNTHROID, LEVOTHROID) 175 MCG tablet Take 175 mcg by mouth daily before breakfast.    . loratadine-pseudoephedrine (CLARITIN-D 24-HOUR) 10-240 MG per 24 hr tablet Take 1 tablet by mouth at bedtime.     . metoprolol succinate (TOPROL-XL) 100 MG 24 hr tablet Take 1 tablet (100 mg total) by mouth daily. Take with or immediately following a meal. 30 tablet 12  . Multiple Vitamin (MULTIVITAMIN WITH MINERALS) TABS Take 1 tablet by mouth at bedtime.     . Multiple Vitamins-Minerals (PRESERVISION AREDS 2) CAPS Take 1 capsule by mouth 2 (two) times daily.    Marland Kitchen olmesartan-hydrochlorothiazide (BENICAR HCT) 40-25 MG per tablet Take 1 tablet by mouth daily.     . Omega-3 Fatty Acids (FISH OIL) 1200 MG CAPS Take 1,200 mg by mouth 2 (two) times daily.    . potassium chloride SA (K-DUR,KLOR-CON) 20 MEQ tablet  Take 1 tablet (20 mEq total) by mouth daily. 30 tablet 12  . rOPINIRole (REQUIP) 0.5 MG tablet Take 2 hours before bedtime. (Patient not taking: Reported on 03/08/2018) 30 tablet 2  . rosuvastatin (CRESTOR) 10 MG tablet Take 10 mg by mouth as directed. Per patient, takes Mondays & Thursdays    . saxagliptin HCl (ONGLYZA) 2.5 MG TABS tablet Take 2.5 mg by mouth daily.    Marland Kitchen spironolactone (ALDACTONE) 25 MG tablet Take 12.5 mg by mouth every evening.     . Tamsulosin HCl (FLOMAX) 0.4 MG CAPS Take 0.4 mg by mouth every other day. Take in the evening - on even days of the month    . triamcinolone cream (KENALOG) 0.1 % Apply 1 application topically as needed.  2  . vitamin B-12 (CYANOCOBALAMIN) 1000 MCG tablet Take 2,500 mcg by mouth at bedtime.      No current facility-administered medications on file prior to visit.     ALLERGIES: Allergies  Allergen Reactions  . Ace Inhibitors Cough  . Crestor [Rosuvastatin] Other (See Comments)    Muscle aches  . Nitroglycerin Other (See Comments)    Very pronounced lowered BP with IV nitro for caths    FAMILY HISTORY: Family History  Problem Relation Age of Onset  . Heart disease Father        Heart Disease before age 73  . Heart attack Father   . Hyperlipidemia Father   . Hypertension Father   . Stroke Mother   . Deep vein thrombosis Mother   . Heart disease Sister   . Diabetes Sister   . Hyperlipidemia Sister   . Hypertension Sister   . Heart attack Sister   . Heart disease Paternal Uncle   . Diabetes Maternal Grandmother   . Diabetes Sister     SOCIAL HISTORY: Social History   Socioeconomic History  . Marital status: Divorced    Spouse name: Not on file  . Number of children: Not on file  . Years of education: Not on file  . Highest education level: Not on file  Occupational History  . Not on file  Social Needs  . Financial resource strain: Not on file  . Food insecurity:    Worry: Not on file    Inability: Not on file  .  Transportation needs:    Medical: Not on file    Non-medical: Not on file  Tobacco Use  . Smoking status: Former Smoker    Types: Cigarettes    Last attempt to quit: 08/01/1970    Years since quitting: 47.9  . Smokeless tobacco: Never Used  Substance and Sexual Activity  . Alcohol use: Yes    Alcohol/week: 3.6 - 5.4 oz    Types: 4 - 5 Glasses of wine, 2 - 4 Cans of beer per week    Comment: 1-3 glasses 3-4 times a week  . Drug use: No  . Sexual activity: Not on file  Lifestyle  . Physical activity:    Days per week: Not on file    Minutes per session: Not on file  . Stress: Not on file  Relationships  . Social connections:    Talks on phone: Not on file    Gets together: Not on file    Attends religious service: Not on file    Active member of club or organization: Not on file    Attends meetings of clubs or organizations: Not on file    Relationship status: Not on file  . Intimate partner violence:    Fear of current or ex partner: Not on file    Emotionally abused: Not on file    Physically abused: Not on file    Forced sexual activity: Not on file  Other Topics Concern  . Not on file  Social History Narrative  . Not on file    REVIEW OF SYSTEMS: Constitutional: No fevers, chills, or sweats, no generalized fatigue, change in appetite Eyes: No visual changes, double vision, eye pain Ear, nose and throat: No hearing loss, ear pain, nasal congestion, sore throat Cardiovascular: No chest pain, palpitations Respiratory:  No shortness of breath at rest or with exertion, wheezes GastrointestinaI: No nausea, vomiting, diarrhea, abdominal pain, fecal incontinence Genitourinary:  No dysuria, urinary retention or frequency Musculoskeletal:  No neck pain, back pain Integumentary: No rash, pruritus, skin lesions Neurological: as above Psychiatric: No depression, insomnia, anxiety Endocrine: No palpitations, fatigue, diaphoresis, mood swings, change in appetite, change in  weight, increased thirst Hematologic/Lymphatic:  No purpura, petechiae. Allergic/Immunologic: no itchy/runny eyes, nasal congestion, recent allergic reactions, rashes  PHYSICAL EXAM: Vitals:   06/14/18 0748  BP: 134/70  Pulse: 61  SpO2: 98%   General: No acute distress.  Patient appears well-groomed.  normal body habitus. Head:  Normocephalic/atraumatic Eyes:  Fundi examined but not visualized Neck: supple, no paraspinal tenderness, full range of motion Heart:  Regular rate and rhythm Lungs:  Clear to auscultation bilaterally Back: No paraspinal tenderness Neurological Exam: alert and oriented to person, place, and time. Attention span and concentration intact, recent and remote memory intact, fund of knowledge intact.  Speech fluent and not dysarthric, language intact.  CN II-XII intact. Bulk and tone normal, muscle strength 5/5 throughout.  Sensation to light touch, temperature intact, vibration sensation reduced in toes.  Deep tendon reflexes 2+ throughout, toes downgoing.  Finger to nose and heel to shin testing intact.  Gait normal, Romberg negative.  IMPRESSION: Restless leg syndrome, uncontrolled Idiopathic peripheral neuropathy  PLAN: 1.  He would prefer to avoid dopamine agonists.  We will try Lyrica 50mg  at bedtime to treat RLS and neuropathic pain.  We may increase dose in 3 weeks if needed/tolerated. 2.  Follow up in 4 months.  19 minutes spent face to face with patient, over 50% spent discussing management.  Metta Clines, DO  CC:  Dr. Moreen Fowler

## 2018-06-14 NOTE — Patient Instructions (Addendum)
1.  We will start Lyrica 50mg  at bedtime.  If no improvement in 3 weeks, contact me.  We can increase dose as needed or tolerated. 2.  Follow up in 4 months.

## 2018-06-17 ENCOUNTER — Encounter (HOSPITAL_COMMUNITY)
Admission: RE | Admit: 2018-06-17 | Discharge: 2018-06-17 | Disposition: A | Discharge status: Home / Self Care | Payer: Medicare Other | Source: Ambulatory Visit | Attending: Cardiology | Admitting: Cardiology

## 2018-06-19 ENCOUNTER — Encounter (HOSPITAL_COMMUNITY)
Admission: RE | Admit: 2018-06-19 | Discharge: 2018-06-19 | Disposition: A | Discharge status: Home / Self Care | Payer: Self-pay | Source: Ambulatory Visit | Attending: Cardiology | Admitting: Cardiology

## 2018-06-21 ENCOUNTER — Encounter (HOSPITAL_COMMUNITY)
Admission: RE | Admit: 2018-06-21 | Discharge: 2018-06-21 | Disposition: A | Discharge status: Home / Self Care | Payer: Self-pay | Source: Ambulatory Visit | Attending: Cardiology | Admitting: Cardiology

## 2018-06-24 ENCOUNTER — Encounter (HOSPITAL_COMMUNITY)
Admission: RE | Admit: 2018-06-24 | Discharge: 2018-06-24 | Disposition: A | Discharge status: Home / Self Care | Payer: Self-pay | Source: Ambulatory Visit | Attending: Cardiology | Admitting: Cardiology

## 2018-06-26 ENCOUNTER — Encounter (HOSPITAL_COMMUNITY)
Admission: RE | Admit: 2018-06-26 | Discharge: 2018-06-26 | Disposition: A | Discharge status: Home / Self Care | Payer: Self-pay | Source: Ambulatory Visit | Attending: Cardiology | Admitting: Cardiology

## 2018-06-27 DIAGNOSIS — M25561 Pain in right knee: Secondary | ICD-10-CM | POA: Insufficient documentation

## 2018-06-28 ENCOUNTER — Encounter (HOSPITAL_COMMUNITY)
Admission: RE | Admit: 2018-06-28 | Discharge: 2018-06-28 | Disposition: A | Discharge status: Home / Self Care | Payer: Self-pay | Source: Ambulatory Visit | Attending: Cardiology | Admitting: Cardiology

## 2018-07-01 ENCOUNTER — Encounter (HOSPITAL_COMMUNITY)
Admission: RE | Admit: 2018-07-01 | Discharge: 2018-07-01 | Disposition: A | Discharge status: Home / Self Care | Payer: Self-pay | Source: Ambulatory Visit | Attending: Cardiology | Admitting: Cardiology

## 2018-07-03 ENCOUNTER — Encounter (HOSPITAL_COMMUNITY)
Admission: RE | Admit: 2018-07-03 | Discharge: 2018-07-03 | Disposition: A | Discharge status: Home / Self Care | Payer: Self-pay | Source: Ambulatory Visit | Attending: Cardiology | Admitting: Cardiology

## 2018-07-05 ENCOUNTER — Encounter (HOSPITAL_COMMUNITY): Payer: Self-pay

## 2018-07-08 ENCOUNTER — Encounter (HOSPITAL_COMMUNITY)
Admission: RE | Admit: 2018-07-08 | Discharge: 2018-07-08 | Disposition: A | Discharge status: Home / Self Care | Payer: Medicare Other | Source: Ambulatory Visit | Attending: Cardiology | Admitting: Cardiology

## 2018-07-10 ENCOUNTER — Encounter (HOSPITAL_COMMUNITY)
Admission: RE | Admit: 2018-07-10 | Discharge: 2018-07-10 | Disposition: A | Discharge status: Home / Self Care | Payer: Medicare Other | Source: Ambulatory Visit | Attending: Cardiology | Admitting: Cardiology

## 2018-07-12 ENCOUNTER — Encounter (HOSPITAL_COMMUNITY): Payer: Self-pay

## 2018-07-17 ENCOUNTER — Encounter (HOSPITAL_COMMUNITY)
Admission: RE | Admit: 2018-07-17 | Discharge: 2018-07-17 | Disposition: A | Discharge status: Home / Self Care | Payer: Self-pay | Source: Ambulatory Visit | Attending: Cardiology | Admitting: Cardiology

## 2018-07-17 DIAGNOSIS — I712 Thoracic aortic aneurysm, without rupture: Secondary | ICD-10-CM | POA: Insufficient documentation

## 2018-07-19 ENCOUNTER — Encounter (HOSPITAL_COMMUNITY)
Admission: RE | Admit: 2018-07-19 | Discharge: 2018-07-19 | Disposition: A | Discharge status: Home / Self Care | Payer: Self-pay | Source: Ambulatory Visit | Attending: Cardiology | Admitting: Cardiology

## 2018-07-22 ENCOUNTER — Encounter (HOSPITAL_COMMUNITY)
Admission: RE | Admit: 2018-07-22 | Discharge: 2018-07-22 | Disposition: A | Discharge status: Home / Self Care | Payer: Medicare Other | Source: Ambulatory Visit | Attending: Cardiology | Admitting: Cardiology

## 2018-07-24 ENCOUNTER — Encounter (HOSPITAL_COMMUNITY)
Admission: RE | Admit: 2018-07-24 | Discharge: 2018-07-24 | Disposition: A | Discharge status: Home / Self Care | Payer: Self-pay | Source: Ambulatory Visit | Attending: Cardiology | Admitting: Cardiology

## 2018-07-26 ENCOUNTER — Encounter (HOSPITAL_COMMUNITY): Payer: Self-pay

## 2018-07-29 ENCOUNTER — Encounter (HOSPITAL_COMMUNITY)
Admission: RE | Admit: 2018-07-29 | Discharge: 2018-07-29 | Disposition: A | Discharge status: Home / Self Care | Payer: Self-pay | Source: Ambulatory Visit | Attending: Cardiology | Admitting: Cardiology

## 2018-07-31 ENCOUNTER — Encounter (HOSPITAL_COMMUNITY)
Admission: RE | Admit: 2018-07-31 | Discharge: 2018-07-31 | Disposition: A | Discharge status: Home / Self Care | Payer: Self-pay | Source: Ambulatory Visit | Attending: Cardiology | Admitting: Cardiology

## 2018-08-02 ENCOUNTER — Encounter (HOSPITAL_COMMUNITY)
Admission: RE | Admit: 2018-08-02 | Discharge: 2018-08-02 | Disposition: A | Discharge status: Home / Self Care | Payer: Medicare Other | Source: Ambulatory Visit | Attending: Cardiology | Admitting: Cardiology

## 2018-08-05 ENCOUNTER — Encounter (HOSPITAL_COMMUNITY)
Admission: RE | Admit: 2018-08-05 | Discharge: 2018-08-05 | Disposition: A | Discharge status: Home / Self Care | Payer: Medicare Other | Source: Ambulatory Visit | Attending: Cardiology | Admitting: Cardiology

## 2018-08-07 ENCOUNTER — Encounter (HOSPITAL_COMMUNITY)
Admission: RE | Admit: 2018-08-07 | Discharge: 2018-08-07 | Disposition: A | Discharge status: Home / Self Care | Payer: Self-pay | Source: Ambulatory Visit | Attending: Cardiology | Admitting: Cardiology

## 2018-08-09 ENCOUNTER — Encounter (HOSPITAL_COMMUNITY): Payer: Self-pay

## 2018-08-12 ENCOUNTER — Encounter (HOSPITAL_COMMUNITY): Payer: Self-pay

## 2018-08-13 ENCOUNTER — Other Ambulatory Visit: Payer: Self-pay | Admitting: Neurology

## 2018-08-14 ENCOUNTER — Encounter (HOSPITAL_COMMUNITY)
Admission: RE | Admit: 2018-08-14 | Discharge: 2018-08-14 | Disposition: A | Discharge status: Home / Self Care | Payer: Self-pay | Source: Ambulatory Visit | Attending: Cardiology | Admitting: Cardiology

## 2018-08-14 DIAGNOSIS — I712 Thoracic aortic aneurysm, without rupture: Secondary | ICD-10-CM | POA: Insufficient documentation

## 2018-08-16 ENCOUNTER — Encounter (HOSPITAL_COMMUNITY): Payer: Self-pay

## 2018-08-19 ENCOUNTER — Encounter (HOSPITAL_COMMUNITY)
Admission: RE | Admit: 2018-08-19 | Discharge: 2018-08-19 | Disposition: A | Discharge status: Home / Self Care | Payer: Self-pay | Source: Ambulatory Visit | Attending: Cardiology | Admitting: Cardiology

## 2018-08-21 ENCOUNTER — Encounter (HOSPITAL_COMMUNITY): Payer: Self-pay

## 2018-08-23 ENCOUNTER — Encounter (HOSPITAL_COMMUNITY): Payer: Self-pay

## 2018-08-26 ENCOUNTER — Encounter (HOSPITAL_COMMUNITY): Payer: Self-pay

## 2018-08-28 ENCOUNTER — Encounter (HOSPITAL_COMMUNITY): Payer: Self-pay

## 2018-08-30 ENCOUNTER — Encounter (HOSPITAL_COMMUNITY): Payer: Self-pay

## 2018-09-02 ENCOUNTER — Encounter (HOSPITAL_COMMUNITY)
Admission: RE | Admit: 2018-09-02 | Discharge: 2018-09-02 | Disposition: A | Discharge status: Home / Self Care | Payer: Self-pay | Source: Ambulatory Visit | Attending: Cardiology | Admitting: Cardiology

## 2018-09-04 ENCOUNTER — Encounter (HOSPITAL_COMMUNITY): Payer: Self-pay

## 2018-09-06 ENCOUNTER — Encounter (HOSPITAL_COMMUNITY): Payer: Self-pay

## 2018-09-06 ENCOUNTER — Encounter: Payer: Self-pay | Admitting: Cardiology

## 2018-09-06 ENCOUNTER — Ambulatory Visit (INDEPENDENT_AMBULATORY_CARE_PROVIDER_SITE_OTHER): Payer: Medicare Other | Admitting: Cardiology

## 2018-09-06 VITALS — BP 120/70 | HR 58 | Ht 70.0 in | Wt 200.0 lb

## 2018-09-06 DIAGNOSIS — I714 Abdominal aortic aneurysm, without rupture, unspecified: Secondary | ICD-10-CM

## 2018-09-06 DIAGNOSIS — Z951 Presence of aortocoronary bypass graft: Secondary | ICD-10-CM | POA: Diagnosis not present

## 2018-09-06 DIAGNOSIS — I2510 Atherosclerotic heart disease of native coronary artery without angina pectoris: Secondary | ICD-10-CM

## 2018-09-06 DIAGNOSIS — E782 Mixed hyperlipidemia: Secondary | ICD-10-CM

## 2018-09-06 DIAGNOSIS — Z9862 Peripheral vascular angioplasty status: Secondary | ICD-10-CM | POA: Diagnosis not present

## 2018-09-06 NOTE — Progress Notes (Signed)
Cardiology Office Note:    Date:  09/06/2018   ID:  Garrett Meek Sr., DOB September 20, 1941, MRN [id number]  PCP:  Antony Contras, MD  Cardiologist:  Jenne Campus, MD    Referring MD: Antony Contras, MD   Chief Complaint  Patient presents with  . Follow-up  Doing well  History of Present Illness:    Garrett GROUND Sr. is a 77 y.o. male with very complex past medical history which include coronary artery disease status post coronary artery bypass graft: Ascending aortic aneurysm: Status post repair of abdominal aortic aneurysm.  Comes today to office for follow-up doing well trying to exercise goes to rehab about 3 times a week however her significant other Garrett Jordan does have terminal cancer and he is taking care of her.  Denies have any chest pain tightness squeezing pressure burning chest does describe to have some pain in his legs when he walks.  Also does have neuropathy being taken care of by neurology however he stopped taking all medications because those been not helping for his neuropathy he usually walks about 10 minutes before going to bed then takes clonazepam then able to sleep all through the night.  Past Medical History:  Diagnosis Date  . AAA (abdominal aortic aneurysm) ([place])   . Arthritis    knees  . Atrial fibrillation ([place])   . CAD (coronary artery disease)   . Cancer [facility])    prostate  . GERD (gastroesophageal reflux disease)   . Gout   . Hyperlipidemia   . Hypertension   . Hypothyroidism   . Kidney stone    right kidney  . Myocardial infarction ([place]) 01/1991   sp inferior  . Nerve compression    right leg  . Reflux   . Seasonal allergies   . Thyroid disease    hypothyroidism    Past Surgical History:  Procedure Laterality Date  . ABDOMINAL AORTAGRAM N/A 02/28/2012   Procedure: ABDOMINAL Maxcine Ham;  Surgeon: Serafina Mitchell, MD;  Location: [facility] CATH LAB;  Service: Cardiovascular;  Laterality: N/A;  . abdominal aortagram embolization  02/28/2012  . ABDOMINAL  AORTIC ANEURYSM REPAIR  Sept. 2013  . CORONARY ARTERY BYPASS GRAFT  04/2009  . [place] and  2009   RCA  . EMBOLIZATION Right 02/28/2012   Procedure: EMBOLIZATION;  Surgeon: Serafina Mitchell, MD;  Location: [facility] CATH LAB;  Service: Cardiovascular;  Laterality: Right;  . IR GENERIC HISTORICAL  12/05/2016   IR RADIOLOGIST EVAL & MGMT 12/05/2016 Jacqulynn Cadet, MD [facility] INTERV RAD  . KIDNEY STONE SURGERY    . LEFT HEART CATHETERIZATION WITH CORONARY/GRAFT ANGIOGRAM N/A 01/08/2015   Procedure: LEFT HEART CATHETERIZATION WITH Beatrix Fetters;  Surgeon: Jacolyn Reedy, MD;  Location: [facility] C[facility] LAB;  Service: Cardiovascular;  Laterality: N/A;  . parathyroid adenoma    . PROSTATE SURGERY     rad seeds  . SPINE SURGERY    . TONSILLECTOMY      Current Medications: Current Meds  Medication Sig  . acetaminophen (TYLENOL) 650 MG CR tablet Take 650 mg by mouth daily as needed for pain. Reported on 11/29/2015  . allopurinol (ZYLOPRIM) 300 MG tablet Take 300 mg by mouth every other day. Take in the evening - on even days of the month  . amLODipine (NORVASC) 10 MG tablet Take 10 mg by mouth daily.  Marland Kitchen aspirin EC 81 MG tablet Take 81 mg by mouth at bedtime.   . Cholecalciferol (VITAMIN D3) 2000 UNITS  TABS Take 2,000 Units by mouth daily.   . clonazePAM (KLONOPIN) 1 MG tablet Take 0.5 mg by mouth at bedtime.   . clopidogrel (PLAVIX) 75 MG tablet Take 75 mg by mouth daily.  . Coenzyme Q10 (EQL COQ10) 300 MG CAPS Take by mouth.  . esomeprazole (NEXIUM) 20 MG capsule Take 20 mg by mouth daily at 12 noon.  . fluticasone (FLONASE) 50 MCG/ACT nasal spray Place 2 sprays into both nostrils daily as needed for allergies.   . Glucose Blood (FREESTYLE TEST VI)   . ibuprofen (ADVIL,MOTRIN) 200 MG tablet Take 200 mg by mouth as needed.  Marland Kitchen JANUVIA 50 MG tablet Take 50 mg by mouth daily.  Marland Kitchen levothyroxine (SYNTHROID, LEVOTHROID) 175 MCG tablet Take 175 mcg by mouth daily before breakfast.  .  loratadine-pseudoephedrine (CLARITIN-D 24-HOUR) 10-240 MG per 24 hr tablet Take 1 tablet by mouth at bedtime.   . metoprolol succinate (TOPROL-XL) 100 MG 24 hr tablet Take 1 tablet (100 mg total) by mouth daily. Take with or immediately following a meal.  . Multiple Vitamin (MULTIVITAMIN WITH MINERALS) TABS Take 1 tablet by mouth at bedtime.   . Multiple Vitamins-Minerals (PRESERVISION AREDS 2) CAPS Take 1 capsule by mouth 2 (two) times daily.  Marland Kitchen olmesartan-hydrochlorothiazide (BENICAR HCT) 40-25 MG per tablet Take 1 tablet by mouth daily.   . Omega-3 Fatty Acids (FISH OIL) 1200 MG CAPS Take 1,200 mg by mouth 2 (two) times daily.  . rosuvastatin (CRESTOR) 10 MG tablet Take 10 mg by mouth as directed. Per patient, takes Mondays & Thursdays  . spironolactone (ALDACTONE) 25 MG tablet Take 12.5 mg by mouth every evening.   . Tamsulosin HCl (FLOMAX) 0.4 MG CAPS Take 0.4 mg by mouth every other day. Take in the evening - on even days of the month  . triamcinolone cream (KENALOG) 0.1 % Apply 1 application topically as needed.  . vitamin B-12 (CYANOCOBALAMIN) 1000 MCG tablet Take 2,500 mcg by mouth at bedtime.   . [DISCONTINUED] potassium chloride SA (K-DUR,KLOR-CON) 20 MEQ tablet Take 1 tablet (20 mEq total) by mouth daily.     Allergies:   Ace inhibitors; Crestor [rosuvastatin]; and Nitroglycerin   Social History   Socioeconomic History  . Marital status: Divorced    Spouse name: Not on file  . Number of children: Not on file  . Years of education: Not on file  . Highest education level: Not on file  Occupational History  . Not on file  Social Needs  . Financial resource strain: Not on file  . Food insecurity:    Worry: Not on file    Inability: Not on file  . Transportation needs:    Medical: Not on file    Non-medical: Not on file  Tobacco Use  . Smoking status: Former Smoker    Types: Cigarettes    Last attempt to quit: 08/01/1970    Years since quitting: 48.1  . Smokeless tobacco:  Never Used  Substance and Sexual Activity  . Alcohol use: Yes    Alcohol/week: 6.0 - 9.0 standard drinks    Types: 4 - 5 Glasses of wine, 2 - 4 Cans of beer per week    Comment: 1-3 glasses 3-4 times a week  . Drug use: No  . Sexual activity: Not on file  Lifestyle  . Physical activity:    Days per week: Not on file    Minutes per session: Not on file  . Stress: Not on file  Relationships  .  Social connections:    Talks on phone: Not on file    Gets together: Not on file    Attends religious service: Not on file    Active member of club or organization: Not on file    Attends meetings of clubs or organizations: Not on file    Relationship status: Not on file  Other Topics Concern  . Not on file  Social History Narrative  . Not on file     Family History: The patient's family history includes Deep vein thrombosis in his mother; Diabetes in his maternal grandmother, sister, and sister; Heart attack in his father and sister; Heart disease in his father, paternal uncle, and sister; Hyperlipidemia in his father and sister; Hypertension in his father and sister; Stroke in his mother. ROS:   Please see the history of present illness.    All 14 point review of systems negative except as described per history of present illness  EKGs/Labs/Other Studies Reviewed:      Recent Labs: No results found for requested labs within last 8760 hours.  Recent Lipid Panel    Component Value Date/Time   CHOL 195 01/08/2015 0404   TRIG 318 (H) 01/08/2015 0404   HDL 29 (L) 01/08/2015 0404   CHOLHDL 6.7 01/08/2015 0404   VLDL 64 (H) 01/08/2015 0404   LDLCALC 102 (H) 01/08/2015 0404    Physical Exam:    VS:  BP 120/70   Pulse (!) 58   Ht 5\' 10"  (1.778 m)   Wt 200 lb (90.7 kg)   SpO2 98%   BMI 28.70 kg/m     Wt Readings from Last 3 Encounters:  09/06/18 200 lb (90.7 kg)  06/14/18 202 lb (91.6 kg)  03/08/18 200 lb (90.7 kg)     GEN:  Well nourished, well developed in no acute  distress HEENT: Normal NECK: No JVD; No carotid bruits LYMPHATICS: No lymphadenopathy CARDIAC: RRR, no murmurs, no rubs, no gallops RESPIRATORY:  Clear to auscultation without rales, wheezing or rhonchi  ABDOMEN: Soft, non-tender, non-distended MUSCULOSKELETAL:  No edema; No deformity  SKIN: Warm and dry LOWER EXTREMITIES: no swelling NEUROLOGIC:  Alert and oriented x 3 PSYCHIATRIC:  Normal affect   ASSESSMENT:    1. Coronary artery disease involving native coronary artery of native heart without angina pectoris   2. Abdominal aortic aneurysm (AAA) without rupture ([place])   3. S/P CABG (coronary artery bypass graft)   4. S/P angioplasty   5. Mixed hyperlipidemia    PLAN:    In order of problems listed above:  1. Coronary artery disease stable from that point review on appropriate medications which I will continue. 2. Abdominal aneurysm as well as thoracic aneurysm to be followed by vascular group from [place]. 3. Status post coronary artery bypass graft stable.  We will get echocardiogram to assess left ventricular ejection fraction 4. Mixed dyslipidemia his cholesterol is fine triglycerides elevated I told him that the exercises on the regular basis and avoiding sweets can help with better management of this problem.  He said he will try.  We will see him back in the office in about 5 months sooner if he get a problem   Medication Adjustments/Labs and Tests Ordered: Current medicines are reviewed at length with the patient today.  Concerns regarding medicines are outlined above.  No orders of the defined types were placed in this encounter.  Medication changes: No orders of the defined types were placed in this encounter.   Signed, Herbie Baltimore  Synetta Fail, MD, [facility] 09/06/2018 1:59 PM    [facility]

## 2018-09-06 NOTE — Patient Instructions (Signed)
Medication Instructions:  Your physician recommends that you continue on your current medications as directed. Please refer to the Current Medication list given to you today.  If you need a refill on your cardiac medications before your next appointment, please call your pharmacy.   Lab work: None.  If you have labs (blood work) drawn today and your tests are completely normal, you will receive your results only by: Marland Kitchen MyChart Message (if you have MyChart) OR . A paper copy in the mail If you have any lab test that is abnormal or we need to change your treatment, we will call you to review the results.  Testing/Procedures: Your physician has requested that you have an echocardiogram. Echocardiography is a painless test that uses sound waves to create images of your heart. It provides your doctor with information about the size and shape of your heart and how well your heart's chambers and valves are working. This procedure takes approximately one hour. There are no restrictions for this procedure.    Follow-Up: At [facility], you and your health needs are our priority.  As part of our continuing mission to provide you with exceptional heart care, we have created designated Provider Care Teams.  These Care Teams include your primary Cardiologist (physician) and Advanced Practice Providers (APPs -  Physician Assistants and Nurse Practitioners) who all work together to provide you with the care you need, when you need it. You will need a follow up appointment in 5 months.  Please call our office 2 months in advance to schedule this appointment.  You may see No primary care provider on file. or another member of our [employer] Provider Team in [place]: Shirlee More, MD . Jyl Heinz, MD  Any Other Special Instructions Will Be Listed Below (If Applicable). Echocardiogram An echocardiogram, or echocardiography, uses sound waves (ultrasound) to produce an image of your heart. The  echocardiogram is simple, painless, obtained within a short period of time, and offers valuable information to your health care provider. The images from an echocardiogram can provide information such as:  Evidence of coronary artery disease (CAD).  Heart size.  Heart muscle function.  Heart valve function.  Aneurysm detection.  Evidence of a past heart attack.  Fluid buildup around the heart.  Heart muscle thickening.  Assess heart valve function.  Tell a health care provider about:  Any allergies you have.  All medicines you are taking, including vitamins, herbs, eye drops, creams, and over-the-counter medicines.  Any problems you or family members have had with anesthetic medicines.  Any blood disorders you have.  Any surgeries you have had.  Any medical conditions you have.  Whether you are pregnant or may be pregnant. What happens before the procedure? No special preparation is needed. Eat and drink normally. What happens during the procedure?  In order to produce an image of your heart, gel will be applied to your chest and a wand-like tool (transducer) will be moved over your chest. The gel will help transmit the sound waves from the transducer. The sound waves will harmlessly bounce off your heart to allow the heart images to be captured in real-time motion. These images will then be recorded.  You may need an IV to receive a medicine that improves the quality of the pictures. What happens after the procedure? You may return to your normal schedule including diet, activities, and medicines, unless your health care provider tells you otherwise. This information is not intended to replace advice  given to you by your health care provider. Make sure you discuss any questions you have with your health care provider. Document Released: 10/27/2000 Document Revised: 06/17/2016 Document Reviewed: 07/07/2013 Elsevier Interactive Patient Education  2017 [employer].

## 2018-09-09 ENCOUNTER — Encounter (HOSPITAL_COMMUNITY): Payer: Self-pay

## 2018-09-11 ENCOUNTER — Encounter (HOSPITAL_COMMUNITY)
Admission: RE | Admit: 2018-09-11 | Discharge: 2018-09-11 | Disposition: A | Discharge status: Home / Self Care | Payer: Self-pay | Source: Ambulatory Visit | Attending: Cardiology | Admitting: Cardiology

## 2018-09-16 ENCOUNTER — Encounter (HOSPITAL_COMMUNITY)
Admission: RE | Admit: 2018-09-16 | Discharge: 2018-09-16 | Disposition: A | Discharge status: Home / Self Care | Payer: Self-pay | Source: Ambulatory Visit | Attending: Cardiology | Admitting: Cardiology

## 2018-09-16 DIAGNOSIS — I712 Thoracic aortic aneurysm, without rupture: Secondary | ICD-10-CM | POA: Insufficient documentation

## 2018-09-25 ENCOUNTER — Encounter (HOSPITAL_COMMUNITY): Payer: Self-pay

## 2018-09-27 ENCOUNTER — Encounter (HOSPITAL_COMMUNITY): Payer: Self-pay

## 2018-09-30 ENCOUNTER — Encounter (HOSPITAL_COMMUNITY)
Admission: RE | Admit: 2018-09-30 | Discharge: 2018-09-30 | Disposition: A | Discharge status: Home / Self Care | Payer: Self-pay | Source: Ambulatory Visit | Attending: Cardiology | Admitting: Cardiology

## 2018-10-02 ENCOUNTER — Encounter (HOSPITAL_COMMUNITY)
Admission: RE | Admit: 2018-10-02 | Discharge: 2018-10-02 | Disposition: A | Discharge status: Home / Self Care | Payer: Self-pay | Source: Ambulatory Visit | Attending: Cardiology | Admitting: Cardiology

## 2018-10-04 ENCOUNTER — Encounter (HOSPITAL_COMMUNITY): Payer: Self-pay

## 2018-10-07 ENCOUNTER — Encounter (HOSPITAL_COMMUNITY)
Admission: RE | Admit: 2018-10-07 | Discharge: 2018-10-07 | Disposition: A | Discharge status: Home / Self Care | Payer: Self-pay | Source: Ambulatory Visit | Attending: Cardiology | Admitting: Cardiology

## 2018-10-09 ENCOUNTER — Encounter (HOSPITAL_COMMUNITY)
Admission: RE | Admit: 2018-10-09 | Discharge: 2018-10-09 | Disposition: A | Discharge status: Home / Self Care | Payer: Self-pay | Source: Ambulatory Visit | Attending: Cardiology | Admitting: Cardiology

## 2018-10-14 ENCOUNTER — Other Ambulatory Visit: Payer: Self-pay | Admitting: Cardiology

## 2018-10-14 ENCOUNTER — Encounter (HOSPITAL_COMMUNITY)
Admission: RE | Admit: 2018-10-14 | Discharge: 2018-10-14 | Disposition: A | Discharge status: Home / Self Care | Payer: Self-pay | Source: Ambulatory Visit | Attending: Cardiology | Admitting: Cardiology

## 2018-10-14 DIAGNOSIS — I712 Thoracic aortic aneurysm, without rupture: Secondary | ICD-10-CM | POA: Insufficient documentation

## 2018-10-14 NOTE — Telephone Encounter (Signed)
° ° ° °  1. Which medications need to be refilled? (please list name of each medication and dose if known) rosuvastatin calcium 20mg  1 tablet on Monday and Thursday  2. Which pharmacy/location (including street and city if local pharmacy) is medication to be sent to?[facility]  3. Do they need a 30 day or 90 day supply? [place]

## 2018-10-15 ENCOUNTER — Telehealth: Payer: Self-pay | Admitting: Cardiology

## 2018-10-15 MED ORDER — OLMESARTAN MEDOXOMIL-HCTZ 40-25 MG PO TABS: 1.0000 | ORAL_TABLET | Freq: Every day | ORAL | 3 refills | Status: DC

## 2018-10-15 NOTE — Telephone Encounter (Signed)
° ° ° °  1. Which medications need to be refilled? (please list name of each medication and dose if known) rosuvastatin calcium 20mg  1 M&TH  2. Which pharmacy/location (including street and city if local pharmacy) is medication to be sent to? [facility] [address] [place]  3. Do they need a 30 day or 90 day supply? [place]

## 2018-10-15 NOTE — Telephone Encounter (Signed)
Patient doesn't need crestor refilled however he did need olmesartan-hctz 40-25mg  refilled. This medication was refilled

## 2018-10-15 NOTE — Telephone Encounter (Signed)
Patient reports he doesn't need medication refill at this time.

## 2018-10-16 ENCOUNTER — Encounter (HOSPITAL_COMMUNITY)
Admission: RE | Admit: 2018-10-16 | Discharge: 2018-10-16 | Disposition: A | Discharge status: Home / Self Care | Payer: Self-pay | Source: Ambulatory Visit | Attending: Cardiology | Admitting: Cardiology

## 2018-10-16 ENCOUNTER — Ambulatory Visit (HOSPITAL_BASED_OUTPATIENT_CLINIC_OR_DEPARTMENT_OTHER)
Admission: RE | Admit: 2018-10-16 | Discharge: 2018-10-16 | Disposition: A | Discharge status: Home / Self Care | Payer: Medicare Other | Source: Ambulatory Visit | Attending: Cardiology | Admitting: Cardiology

## 2018-10-16 DIAGNOSIS — I2510 Atherosclerotic heart disease of native coronary artery without angina pectoris: Secondary | ICD-10-CM | POA: Diagnosis present

## 2018-10-16 DIAGNOSIS — I714 Abdominal aortic aneurysm, without rupture: Secondary | ICD-10-CM | POA: Diagnosis not present

## 2018-10-16 NOTE — Progress Notes (Signed)
  Echocardiogram 2D Echocardiogram has been performed.  Garrett Jordan Garrett Jordan 10/16/2018, 9:00 AM

## 2018-10-18 ENCOUNTER — Encounter (HOSPITAL_COMMUNITY)
Admission: RE | Admit: 2018-10-18 | Discharge: 2018-10-18 | Disposition: A | Discharge status: Home / Self Care | Payer: Self-pay | Source: Ambulatory Visit | Attending: Cardiology | Admitting: Cardiology

## 2018-10-21 ENCOUNTER — Encounter (HOSPITAL_COMMUNITY)
Admission: RE | Admit: 2018-10-21 | Discharge: 2018-10-21 | Disposition: A | Discharge status: Home / Self Care | Payer: Medicare Other | Source: Ambulatory Visit | Attending: Cardiology | Admitting: Cardiology

## 2018-10-23 ENCOUNTER — Encounter (HOSPITAL_COMMUNITY)
Admission: RE | Admit: 2018-10-23 | Discharge: 2018-10-23 | Disposition: A | Discharge status: Home / Self Care | Payer: Self-pay | Source: Ambulatory Visit | Attending: Cardiology | Admitting: Cardiology

## 2018-10-25 ENCOUNTER — Encounter (HOSPITAL_COMMUNITY): Payer: Self-pay

## 2018-10-28 ENCOUNTER — Encounter (HOSPITAL_COMMUNITY): Payer: Self-pay

## 2018-10-30 ENCOUNTER — Encounter (HOSPITAL_COMMUNITY)
Admission: RE | Admit: 2018-10-30 | Discharge: 2018-10-30 | Disposition: A | Discharge status: Home / Self Care | Payer: Self-pay | Source: Ambulatory Visit | Attending: Cardiology | Admitting: Cardiology

## 2018-11-01 ENCOUNTER — Encounter (HOSPITAL_COMMUNITY): Payer: Self-pay

## 2018-11-04 ENCOUNTER — Encounter (HOSPITAL_COMMUNITY): Payer: Self-pay

## 2018-11-08 ENCOUNTER — Encounter (HOSPITAL_COMMUNITY)
Admission: RE | Admit: 2018-11-08 | Discharge: 2018-11-08 | Disposition: A | Discharge status: Home / Self Care | Payer: Medicare Other | Source: Ambulatory Visit | Attending: Cardiology | Admitting: Cardiology

## 2018-11-11 ENCOUNTER — Encounter (HOSPITAL_COMMUNITY)
Admission: RE | Admit: 2018-11-11 | Discharge: 2018-11-11 | Disposition: A | Discharge status: Home / Self Care | Payer: Self-pay | Source: Ambulatory Visit | Attending: Cardiology | Admitting: Cardiology

## 2018-11-15 ENCOUNTER — Encounter (HOSPITAL_COMMUNITY)
Admission: RE | Admit: 2018-11-15 | Discharge: 2018-11-15 | Disposition: A | Discharge status: Home / Self Care | Payer: Self-pay | Source: Ambulatory Visit | Attending: Cardiology | Admitting: Cardiology

## 2018-11-15 DIAGNOSIS — I712 Thoracic aortic aneurysm, without rupture: Secondary | ICD-10-CM | POA: Insufficient documentation

## 2018-11-18 ENCOUNTER — Encounter (HOSPITAL_COMMUNITY)
Admission: RE | Admit: 2018-11-18 | Discharge: 2018-11-18 | Disposition: A | Discharge status: Home / Self Care | Payer: Self-pay | Source: Ambulatory Visit | Attending: Cardiology | Admitting: Cardiology

## 2018-11-20 ENCOUNTER — Encounter (HOSPITAL_COMMUNITY): Payer: Self-pay

## 2018-11-22 ENCOUNTER — Encounter (HOSPITAL_COMMUNITY)
Admission: RE | Admit: 2018-11-22 | Discharge: 2018-11-22 | Disposition: A | Discharge status: Home / Self Care | Payer: Self-pay | Source: Ambulatory Visit | Attending: Cardiology | Admitting: Cardiology

## 2018-11-25 ENCOUNTER — Encounter (HOSPITAL_COMMUNITY): Payer: Self-pay

## 2018-11-26 ENCOUNTER — Encounter: Payer: Self-pay | Admitting: Cardiology

## 2018-11-26 ENCOUNTER — Ambulatory Visit: Payer: Medicare Other | Admitting: Cardiology

## 2018-11-26 VITALS — BP 134/70 | HR 97 | Ht 70.0 in | Wt 202.0 lb

## 2018-11-26 DIAGNOSIS — I714 Abdominal aortic aneurysm, without rupture, unspecified: Secondary | ICD-10-CM

## 2018-11-26 DIAGNOSIS — Z8639 Personal history of other endocrine, nutritional and metabolic disease: Secondary | ICD-10-CM

## 2018-11-26 DIAGNOSIS — Z951 Presence of aortocoronary bypass graft: Secondary | ICD-10-CM

## 2018-11-26 DIAGNOSIS — I2510 Atherosclerotic heart disease of native coronary artery without angina pectoris: Secondary | ICD-10-CM | POA: Diagnosis not present

## 2018-11-26 NOTE — Progress Notes (Signed)
Cardiology Office Note:    Date:  11/26/2018   ID:  Garrett Jordan Sr., DOB August 25, 1941, MRN [id number]  PCP:  Antony Contras, MD  Cardiologist:  Jenne Campus, MD    Referring MD: Antony Contras, MD   Chief Complaint  Patient presents with  . Follow-up  Cardiac wise doing fair  History of Present Illness:    Garrett HULBERT Sr. is a 78 y.o. male with coronary artery disease status post coronary artery bypass graft also aortic and abdominal aneurysm.  Recently just few months ago he significant other of many years passed because of ovarian metastatic cancer.  Obviously is he is grieving after that he describes episodes of some chest tightness sometimes when he get very upset when he does extreme exercise.  At the same time he is going to the gym a few times a week also goes to rehab 3 times a week and have no difficulty doing it.  Described also to have episode that he describes as panic attack it will be sudden leaving him profoundly weak and exhausted to the point that he have to lay down on the floor and all in the bed for a few hours sometimes.  There is no palpitations associated with this sensation when he check his blood pressure is usually elevated when he check his heart rate is usually more than 100 but no irregular.  He thinks this is related to stress and panic he described few episodes that happen after Garrett Jordan's significant other past.  Past Medical History:  Diagnosis Date  . AAA (abdominal aortic aneurysm) ([place])   . Arthritis    knees  . Atrial fibrillation ([place])   . CAD (coronary artery disease)   . Cancer [facility])    prostate  . GERD (gastroesophageal reflux disease)   . Gout   . Hyperlipidemia   . Hypertension   . Hypothyroidism   . Kidney stone    right kidney  . Myocardial infarction ([place]) 01/1991   sp inferior  . Nerve compression    right leg  . Reflux   . Seasonal allergies   . Thyroid disease    hypothyroidism    Past Surgical History:  Procedure  Laterality Date  . ABDOMINAL AORTAGRAM N/A 02/28/2012   Procedure: ABDOMINAL Maxcine Ham;  Surgeon: Serafina Mitchell, MD;  Location: [facility] CATH LAB;  Service: Cardiovascular;  Laterality: N/A;  . abdominal aortagram embolization  02/28/2012  . ABDOMINAL AORTIC ANEURYSM REPAIR  Sept. 2013  . CORONARY ARTERY BYPASS GRAFT  04/2009  . [place] and  2009   RCA  . EMBOLIZATION Right 02/28/2012   Procedure: EMBOLIZATION;  Surgeon: Serafina Mitchell, MD;  Location: [facility] CATH LAB;  Service: Cardiovascular;  Laterality: Right;  . IR GENERIC HISTORICAL  12/05/2016   IR RADIOLOGIST EVAL & MGMT 12/05/2016 Jacqulynn Cadet, MD [facility] INTERV RAD  . KIDNEY STONE SURGERY    . LEFT HEART CATHETERIZATION WITH CORONARY/GRAFT ANGIOGRAM N/A 01/08/2015   Procedure: LEFT HEART CATHETERIZATION WITH Beatrix Fetters;  Surgeon: Jacolyn Reedy, MD;  Location: [facility] CATH LAB;  Service: Cardiovascular;  Laterality: N/A;  . parathyroid adenoma    . PROSTATE SURGERY     rad seeds  . SPINE SURGERY    . TONSILLECTOMY      Current Medications: Current Meds  Medication Sig  . acetaminophen (TYLENOL) 650 MG CR tablet Take 650 mg by mouth daily as needed for pain. Reported on 11/29/2015  . allopurinol (ZYLOPRIM) 300  MG tablet Take 300 mg by mouth every other day. Take in the evening - on even days of the month  . amLODipine (NORVASC) 10 MG tablet Take 10 mg by mouth daily.  Marland Kitchen aspirin EC 81 MG tablet Take 81 mg by mouth at bedtime.   . Cholecalciferol (VITAMIN D3) 2000 UNITS TABS Take 2,000 Units by mouth daily.   . clonazePAM (KLONOPIN) 1 MG tablet Take 0.5 mg by mouth at bedtime.   . clopidogrel (PLAVIX) 75 MG tablet Take 75 mg by mouth daily.  . Coenzyme Q10 (EQL COQ10) 300 MG CAPS Take by mouth.  . esomeprazole (NEXIUM) 20 MG capsule Take 20 mg by mouth daily at 12 noon.  . fluticasone (FLONASE) 50 MCG/ACT nasal spray Place 2 sprays into both nostrils daily as needed for allergies.   . Glucose Blood  (FREESTYLE TEST VI)   . ibuprofen (ADVIL,MOTRIN) 200 MG tablet Take 200 mg by mouth as needed.  Marland Kitchen JANUVIA 50 MG tablet Take 50 mg by mouth daily.  Marland Kitchen levothyroxine (SYNTHROID, LEVOTHROID) 175 MCG tablet Take 175 mcg by mouth daily before breakfast.  . loratadine-pseudoephedrine (CLARITIN-D 24-HOUR) 10-240 MG per 24 hr tablet Take 1 tablet by mouth at bedtime.   . metoprolol succinate (TOPROL-XL) 100 MG 24 hr tablet Take 1 tablet (100 mg total) by mouth daily. Take with or immediately following a meal.  . Multiple Vitamin (MULTIVITAMIN WITH MINERALS) TABS Take 1 tablet by mouth at bedtime.   . Multiple Vitamins-Minerals (PRESERVISION AREDS 2) CAPS Take 1 capsule by mouth 2 (two) times daily.  Marland Kitchen olmesartan-hydrochlorothiazide (BENICAR HCT) 40-25 MG tablet Take 1 tablet by mouth daily.  . Omega-3 Fatty Acids (FISH OIL) 1200 MG CAPS Take 1,200 mg by mouth 2 (two) times daily.  . rosuvastatin (CRESTOR) 10 MG tablet Take 10 mg by mouth as directed. Per patient, takes Mondays & Thursdays  . spironolactone (ALDACTONE) 25 MG tablet Take 12.5 mg by mouth every evening.   . Tamsulosin HCl (FLOMAX) 0.4 MG CAPS Take 0.4 mg by mouth every other day. Take in the evening - on even days of the month  . triamcinolone cream (KENALOG) 0.1 % Apply 1 application topically as needed.  . vitamin B-12 (CYANOCOBALAMIN) 1000 MCG tablet Take 2,500 mcg by mouth at bedtime.      Allergies:   Ace inhibitors; Crestor [rosuvastatin]; and Nitroglycerin   Social History   Socioeconomic History  . Marital status: Divorced    Spouse name: Not on file  . Number of children: Not on file  . Years of education: Not on file  . Highest education level: Not on file  Occupational History  . Not on file  Social Needs  . Financial resource strain: Not on file  . Food insecurity:    Worry: Not on file    Inability: Not on file  . Transportation needs:    Medical: Not on file    Non-medical: Not on file  Tobacco Use  . Smoking  status: Former Smoker    Types: Cigarettes    Last attempt to quit: 08/01/1970    Years since quitting: 48.3  . Smokeless tobacco: Never Used  Substance and Sexual Activity  . Alcohol use: Yes    Alcohol/week: 6.0 - 9.0 standard drinks    Types: 4 - 5 Glasses of wine, 2 - 4 Cans of beer per week    Comment: 1-3 glasses 3-4 times a week  . Drug use: No  . Sexual activity: Not on  file  Lifestyle  . Physical activity:    Days per week: Not on file    Minutes per session: Not on file  . Stress: Not on file  Relationships  . Social connections:    Talks on phone: Not on file    Gets together: Not on file    Attends religious service: Not on file    Active member of club or organization: Not on file    Attends meetings of clubs or organizations: Not on file    Relationship status: Not on file  Other Topics Concern  . Not on file  Social History Narrative  . Not on file     Family History: The patient's family history includes Deep vein thrombosis in his mother; Diabetes in his maternal grandmother, sister, and sister; Heart attack in his father and sister; Heart disease in his father, paternal uncle, and sister; Hyperlipidemia in his father and sister; Hypertension in his father and sister; Stroke in his mother. ROS:   Please see the history of present illness.    All 14 point review of systems negative except as described per history of present illness  EKGs/Labs/Other Studies Reviewed:    Echocardiogram done in December 2019:  Marland Kitchen Left ventricular systolic function is preserved visually   estimated ejection fraction is 50 to 55%. Mild to moderate   concentric left ventricular hypertrophy.   2. Mildly dilated left atrium.   3. Mildly dilated ascending aorta measured at 4.4 cm.  Recent Labs: No results found for requested labs within last 8760 hours.  Recent Lipid Panel    Component Value Date/Time   CHOL 195 01/08/2015 0404   TRIG 318 (H) 01/08/2015 0404   HDL 29 (L)  01/08/2015 0404   CHOLHDL 6.7 01/08/2015 0404   VLDL 64 (H) 01/08/2015 0404   LDLCALC 102 (H) 01/08/2015 0404    Physical Exam:    VS:  BP 134/70   Pulse 97   Ht 5\' 10"  (1.778 m)   Wt 202 lb (91.6 kg)   SpO2 97%   BMI 28.98 kg/m     Wt Readings from Last 3 Encounters:  11/26/18 202 lb (91.6 kg)  09/06/18 200 lb (90.7 kg)  06/14/18 202 lb (91.6 kg)     GEN:  Well nourished, well developed in no acute distress HEENT: Normal NECK: No JVD; No carotid bruits LYMPHATICS: No lymphadenopathy CARDIAC: RRR, no murmurs, no rubs, no gallops RESPIRATORY:  Clear to auscultation without rales, wheezing or rhonchi  ABDOMEN: Soft, non-tender, non-distended MUSCULOSKELETAL:  No edema; No deformity  SKIN: Warm and dry LOWER EXTREMITIES: no swelling NEUROLOGIC:  Alert and oriented x 3 PSYCHIATRIC:  Normal affect   ASSESSMENT:    1. Coronary artery disease involving native coronary artery of native heart without angina pectoris   2. S/P CABG (coronary artery bypass graft)   3. History of hyperparathyroidism   4. Abdominal aortic aneurysm (AAA) without rupture (HCC)    PLAN:    In order of problems listed above:  1. Coronary artery disease does have some exertional symptoms I will schedule him to have stress echocardiogram to see if there is any inducible ischemia. 2. Status post coronary artery bypass graft noted. 3. History of dyslipidemia.  We will continue present management. 4.  5. Episode of profound weakness and fatigue I will schedule him to have monitor for 14 days to see if there is any significant arrhythmia 6. Abdominal aneurysm followed by group in [place] he is scheduled  to have repeated CAT scan in March.   Medication Adjustments/Labs and Tests Ordered: Current medicines are reviewed at length with the patient today.  Concerns regarding medicines are outlined above.  No orders of the defined types were placed in this encounter.  Medication changes: No orders of  the defined types were placed in this encounter.   Signed, Park Liter, MD, [facility] 11/26/2018 11:16 AM    [place]

## 2018-11-26 NOTE — Patient Instructions (Signed)
Medication Instructions:  Your physician recommends that you continue on your current medications as directed. Please refer to the Current Medication list given to you today.  If you need a refill on your cardiac medications before your next appointment, please call your pharmacy.   Lab work: None.  If you have labs (blood work) drawn today and your tests are completely normal, you will receive your results only by: Marland Kitchen MyChart Message (if you have MyChart) OR . A paper copy in the mail If you have any lab test that is abnormal or we need to change your treatment, we will call you to review the results.  Testing/Procedures: Your physician has requested that you have a stress echocardiogram. For further information please visit [email or web address]. Please follow instruction sheet as given.  Your physician has recommended that you wear a holter monitor. Holter monitors are medical devices that record the heart's electrical activity. Doctors most often use these monitors to diagnose arrhythmias. Arrhythmias are problems with the speed or rhythm of the heartbeat. The monitor is a small, portable device. You can wear one while you do your normal daily activities. This is usually used to diagnose what is causing palpitations/syncope (passing out). Wear for 14 days     Follow-Up: At [facility], you and your health needs are our priority.  As part of our continuing mission to provide you with exceptional heart care, we have created designated Provider Care Teams.  These Care Teams include your primary Cardiologist (physician) and Advanced Practice Providers (APPs -  Physician Assistants and Nurse Practitioners) who all work together to provide you with the care you need, when you need it. You will need a follow up appointment in 6 weeks.  Please call our office 2 months in advance to schedule this appointment.  You may see No primary care provider on file. or another member of our [employer]  Provider Team in [place]: Shirlee More, MD . Jyl Heinz, MD  Any Other Special Instructions Will Be Listed Below (If Applicable).    Exercise Stress Echocardiogram  An exercise stress echocardiogram is a test to check how well your heart is working. This test uses sound waves (ultrasound) and a computer to make images of your heart before and after exercise. Ultrasound images that are taken before you exercise (your resting echocardiogram) will show how much blood is getting to your heart muscle and how well your heart muscle and heart valves are functioning. During the next part of this test, you will walk on a treadmill or ride a stationary bike to see how exercise affects your heart. While you exercise, the electrical activity of your heart will be monitored with an electrocardiogram (ECG). Your blood pressure will also be monitored. You may have this test if you:  Have chest pain or other symptoms of a heart problem.  Recently had a heart attack or heart surgery.  Have heart valve problems.  Have a condition that causes narrowing of the blood vessels that supply your heart (coronary artery disease).  Have a high risk of heart disease and are starting a new exercise program.  Have a high risk of heart disease and need to have major surgery. Tell a health care provider about:  Any allergies you have.  All medicines you are taking, including vitamins, herbs, eye drops, creams, and over-the-counter medicines.  Any problems you or family members have had with anesthetic medicines.  Any blood disorders you have.  Any surgeries you have had.  Any medical conditions you have.  Whether you are pregnant or may be pregnant. What are the risks? Generally, this is a safe procedure. However, problems may occur, including:  Chest pain.  Dizziness or light-headedness.  Shortness of breath.  Increased or irregular heartbeat (palpitations).  Nausea or vomiting.  Heart  attack (very rare). What happens before the procedure?  Follow instructions from your health care provider about eating or drinking restrictions. You may be asked to avoid all forms of caffeine for 24 hours before your procedure, or as told by your health care provider.  Ask your health care provider about changing or stopping your regular medicines. This is especially important if you are taking diabetes medicines or blood thinners.  If you use an inhaler, bring it with you to the test.  Wear loose, comfortable clothing and walking shoes.  Do notuse any products that contain nicotine or tobacco, such as cigarettes and e-cigarettes, for 4 hours before the test or as told by your health care provider. If you need help quitting, ask your health care provider. What happens during the procedure?  You will take off your clothes from the waist up and put on a hospital gown.  A technician will place electrodes on your chest.  A blood pressure cuff will be placed on your arm.  You will lie down on a table for an ultrasound exam before you exercise. Gel will be rubbed on your chest, and a handheld device (transducer) will be pressed against your chest and moved over your heart.  Then, you will start exercising by walking on a treadmill or pedaling a stationary bicycle.  Your blood pressure and heart rhythm will be monitored while you exercise.  The exercise will gradually get harder or faster.  You will exercise until: ? Your heart reaches a target level. ? You are too tired to continue. ? You cannot continue because of chest pain, weakness, or dizziness.  You will have another ultrasound exam after you stop exercising. The procedure may vary among health care providers and hospitals. What happens after the procedure?  Your heart rate and blood pressure will be monitored until they return to your normal levels. Summary  An exercise stress echocardiogram is a test that uses ultrasound  to check how well your heart works before and after exercise.  Before the test, follow instructions from your health care provider about stopping medications, avoiding nicotine and tobacco, and avoiding certain foods and drinks.  During the test, your blood pressure and heart rhythm will be monitored while you exercise on a treadmill or stationary bicycle. This information is not intended to replace advice given to you by your health care provider. Make sure you discuss any questions you have with your health care provider. Document Released: 11/03/2004 Document Revised: 06/21/2016 Document Reviewed: 06/21/2016 Elsevier Interactive Patient Education  2019 [employer].   Ambulatory Cardiac Monitoring An ambulatory cardiac monitor is a small recording device that is used to detect abnormal heart rhythms (arrhythmias). Most monitors are connected by wires to flat, sticky disks (electrodes) that are then attached to your chest. You may need to wear a monitor if you have had symptoms such as:  Fast heartbeats (palpitations).  Dizziness.  Fainting or light-headedness.  Unexplained weakness.  Shortness of breath. There are several types of monitors. Some common monitors include:  Holter monitor. This records your heart rhythm continuously, usually for 24-48 hours.  Event (episodic) monitor. This monitor has a symptoms button, and when pushed, it  will begin recording. You need to activate this monitor to record when you have a heart-related symptom.  Automatic detection monitor. This monitor will begin recording when it detects an abnormal heartbeat. What are the risks? Generally, these devices are safe to use. However, it is possible that the skin under the electrodes will become irritated. How to prepare for monitoring Your health care provider will prepare your chest for the electrode placement and show you how to use the monitor.  Do not apply lotions to your chest before  monitoring.  Follow directions on how to care for the monitor, and how to return the monitor when the testing period is complete. How to use your cardiac monitor  Follow directions about how long to wear the monitor, and if you can take the monitor off in order to shower or bathe. ? Do not let the monitor get wet. ? Do not bathe, swim, or use a hot tub while wearing the monitor.  Keep your skin clean. Do not put body lotion or moisturizer on your chest.  Change the electrodes as told by your health care provider, or any time they stop sticking to your skin. You may need to use medical tape to keep them on.  Try to put the electrodes in slightly different places on your chest to help prevent skin irritation. Follow directions from your health care provider about where to place the electrodes.  Make sure the monitor is safely clipped to your clothing or in a location close to your body as recommended by your health care provider.  If your monitor has a symptoms button, press the button to mark an event as soon as you feel a heart-related symptom, such as: ? Dizziness. ? Weakness. ? Light-headedness. ? Palpitations. ? Thumping or pounding in your chest. ? Shortness of breath. ? Unexplained weakness.  Keep a diary of your activities, such as walking, doing chores, and taking medicine. It is very important to note what you were doing when you pushed the button to record your symptoms. This will help your health care provider determine what might be contributing to your symptoms.  Send the recorded information as recommended by your health care provider. It may take some time for your health care provider to process the results.  Change the batteries as told by your health care provider.  Keep electronic devices away from your monitor. These include: ? Tablets. ? MP3 players. ? Cell phones.  While wearing your monitor you should avoid: ? Electric blankets. ? Tax inspector. ? Electric toothbrushes. ? Microwave ovens. ? Magnets. ? Metal detectors. Get help right away if:  You have chest pain.  You have shortness of breath or extreme difficulty breathing.  You develop a very fast heartbeat that does not get better.  You develop dizziness that does not go away.  You faint or constantly feel like you are about to faint. Summary  An ambulatory cardiac monitor is a small recording device that is used to detect abnormal heart rhythms (arrhythmias).  Make sure you understand how to send the information from the monitor to your health care provider.  It is important to press the button on the monitor when you have any heart-related symptoms.  Keep a diary of your activities, such as walking, doing chores, and taking medicine. It is very important to note what you were doing when you pushed the button to record your symptoms. This will help your health care provider learn what might be causing  your symptoms. This information is not intended to replace advice given to you by your health care provider. Make sure you discuss any questions you have with your health care provider. Document Released: 08/08/2008 Document Revised: 08/15/2017 Document Reviewed: 10/14/2016 Elsevier Interactive Patient Education  [employer].  '

## 2018-11-27 ENCOUNTER — Encounter (HOSPITAL_COMMUNITY)
Admission: RE | Admit: 2018-11-27 | Discharge: 2018-11-27 | Disposition: A | Discharge status: Home / Self Care | Payer: Self-pay | Source: Ambulatory Visit | Attending: Cardiology | Admitting: Cardiology

## 2018-11-29 ENCOUNTER — Other Ambulatory Visit: Payer: Self-pay | Admitting: Cardiology

## 2018-11-29 ENCOUNTER — Encounter (HOSPITAL_COMMUNITY)
Admission: RE | Admit: 2018-11-29 | Discharge: 2018-11-29 | Disposition: A | Discharge status: Home / Self Care | Payer: Self-pay | Source: Ambulatory Visit | Attending: Cardiology | Admitting: Cardiology

## 2018-11-29 DIAGNOSIS — I2510 Atherosclerotic heart disease of native coronary artery without angina pectoris: Secondary | ICD-10-CM

## 2018-11-29 DIAGNOSIS — Z951 Presence of aortocoronary bypass graft: Secondary | ICD-10-CM

## 2018-11-29 DIAGNOSIS — R009 Unspecified abnormalities of heart beat: Secondary | ICD-10-CM

## 2018-11-29 DIAGNOSIS — R030 Elevated blood-pressure reading, without diagnosis of hypertension: Secondary | ICD-10-CM

## 2018-12-02 ENCOUNTER — Encounter (HOSPITAL_COMMUNITY): Payer: Self-pay

## 2018-12-02 ENCOUNTER — Encounter: Payer: Self-pay | Admitting: Cardiology

## 2018-12-02 ENCOUNTER — Ambulatory Visit (INDEPENDENT_AMBULATORY_CARE_PROVIDER_SITE_OTHER): Payer: Medicare Other

## 2018-12-02 ENCOUNTER — Telehealth: Payer: Self-pay | Admitting: Cardiology

## 2018-12-02 ENCOUNTER — Ambulatory Visit (INDEPENDENT_AMBULATORY_CARE_PROVIDER_SITE_OTHER): Payer: Medicare Other | Admitting: Cardiology

## 2018-12-02 ENCOUNTER — Encounter (HOSPITAL_BASED_OUTPATIENT_CLINIC_OR_DEPARTMENT_OTHER): Payer: Self-pay

## 2018-12-02 ENCOUNTER — Ambulatory Visit (HOSPITAL_BASED_OUTPATIENT_CLINIC_OR_DEPARTMENT_OTHER): Admission: RE | Admit: 2018-12-02 | Payer: Medicare Other | Source: Ambulatory Visit

## 2018-12-02 VITALS — BP 110/70 | HR 113 | Resp 16 | Ht 70.0 in | Wt 200.0 lb

## 2018-12-02 DIAGNOSIS — I119 Hypertensive heart disease without heart failure: Secondary | ICD-10-CM

## 2018-12-02 DIAGNOSIS — E782 Mixed hyperlipidemia: Secondary | ICD-10-CM | POA: Diagnosis not present

## 2018-12-02 DIAGNOSIS — I2510 Atherosclerotic heart disease of native coronary artery without angina pectoris: Secondary | ICD-10-CM | POA: Diagnosis not present

## 2018-12-02 DIAGNOSIS — I714 Abdominal aortic aneurysm, without rupture, unspecified: Secondary | ICD-10-CM

## 2018-12-02 DIAGNOSIS — Z9862 Peripheral vascular angioplasty status: Secondary | ICD-10-CM

## 2018-12-02 DIAGNOSIS — Z951 Presence of aortocoronary bypass graft: Secondary | ICD-10-CM

## 2018-12-02 DIAGNOSIS — I480 Paroxysmal atrial fibrillation: Secondary | ICD-10-CM | POA: Diagnosis not present

## 2018-12-02 NOTE — Progress Notes (Signed)
Cardiology Office Note:    Date:  12/02/2018   ID:  Garrett TRIMARCO Sr., DOB [birth year], MRN [id number]  PCP:  Garrett Contras, MD  Cardiologist:  Garrett Lindau, MD   Referring MD: Garrett Contras, MD    ASSESSMENT:    1. Paroxysmal atrial fibrillation (HCC)   2. Hypertensive heart disease without CHF   3. Mixed hyperlipidemia   4. Coronary artery disease involving native coronary artery of native heart without angina pectoris   5. Abdominal aortic aneurysm (AAA) without rupture ([place])   6. S/P CABG (coronary artery bypass graft)   7. S/P angioplasty    PLAN:    In order of problems listed above:  1. I discussed my findings with the patient at extensive length.I discussed with the patient atrial fibrillation, disease process. Management and therapy including rate and rhythm control, anticoagulation benefits and potential risks were discussed extensively with the patient. Patient had multiple questions which were answered to patient's satisfaction. 2. The patient has now been diagnosed with paroxysmal atrial fibrillation.  Canceled event monitor.  He will have blood work including TSH and renal function.  In view of this I will initiate him on Eliquis dose adjusted.  We will check stool for occult blood.  He will be back in 2 days for an evaluation.  He will continue his beta-blocker daily.  I will evaluate him in about 2 days to see how his heart rates are.  He might be an normal rhythm at that time.  After significant time which is 2 to 3 weeks of anticoagulation I am considering initiate him on low-dose amiodarone therapy to keep him in sinus rhythm. 3. The above was discussed extensively with the patient.  I will stop his aspirin and he will take Plavix and Eliquis.  He knows to go to the nearest emergency room for any significant concerns.  Total time for this evaluation was 45 minutes.   Medication Adjustments/Labs and Tests Ordered: Current medicines are reviewed at length with the  patient today.  Concerns regarding medicines are outlined above.  Orders Placed This Encounter  Procedures  . Fecal occult blood, imunochemical  . Basic metabolic panel  . CBC  . TSH  . Hepatic function panel   No orders of the defined types were placed in this encounter.    No chief complaint on file.    History of Present Illness:    Garrett Lucks. is a 78 y.o. male.  Patient has seen Garrett Jordan in the past he has known coronary artery disease and undergone bypass surgery in the remote past and multiple coronary interventions including stenting.  The patient is on aspirin and Plavix.  Because of Garrett Jordan situation he has now seen Garrett Jordan.  The patient given history of feeling tired and fatigued on and off and therefore he was evaluated by an echocardiogram which revealed preserved systolic function and left atrial size which was fine.  He was scheduled for a stress test and an event monitor.  Today he skipped his metoprolol.  When he got ready for the test his heart rate was almost at 85% even before the starting of the test and he was found to be in irregular heartbeat and a diagnosis of atrial fibrillation was made.  At the time of my evaluation is alert awake oriented and in no distress he has ascending aortic dilatation with penetrating ulcer by CT scan done last year and an abdominal aortic aneurysm.  For  this he is under the care of vascular specialist and our cardiothoracic surgeon colleague.  At the time of my evaluation, the patient is alert awake oriented and in no distress.  Past Medical History:  Diagnosis Date  . AAA (abdominal aortic aneurysm) ([place])   . Arthritis    knees  . Atrial fibrillation ([place])   . CAD (coronary artery disease)   . Cancer [facility])    prostate  . GERD (gastroesophageal reflux disease)   . Gout   . Hyperlipidemia   . Hypertension   . Hypothyroidism   . Kidney stone    right kidney  . Myocardial infarction ([place]) 01/1991   sp inferior    . Nerve compression    right leg  . Reflux   . Seasonal allergies   . Thyroid disease    hypothyroidism    Past Surgical History:  Procedure Laterality Date  . ABDOMINAL AORTAGRAM N/A 02/28/2012   Procedure: ABDOMINAL Maxcine Ham;  Surgeon: Serafina Mitchell, MD;  Location: [facility] CATH LAB;  Service: Cardiovascular;  Laterality: N/A;  . abdominal aortagram embolization  02/28/2012  . ABDOMINAL AORTIC ANEURYSM REPAIR  Sept. 2013  . CORONARY ARTERY BYPASS GRAFT  04/2009  . [place] and  2009   RCA  . EMBOLIZATION Right 02/28/2012   Procedure: EMBOLIZATION;  Surgeon: Serafina Mitchell, MD;  Location[facility] CATH LAB;  Service: Cardiovascular;  Laterality: Right;  . IR GENERIC HISTORICAL  12/05/2016   IR RADIOLOGIST EVAL & MGMT 12/05/2016 Jacqulynn Cadet, MD [facility] INTERV RAD  . KIDNEY STONE SURGERY    . LEFT HEART CATHETERIZATION WITH CORONARY/GRAFT ANGIOGRAM N/A 01/08/2015   Procedure: LEFT HEART CATHETERIZATION WITH Beatrix Fetters;  Surgeon: Jacolyn Reedy, MD;  Location: [facility] CATH LAB;  Service: Cardiovascular;  Laterality: N/A;  . parathyroid adenoma    . PROSTATE SURGERY     rad seeds  . SPINE SURGERY    . TONSILLECTOMY      Current Medications: Current Meds  Medication Sig  . acetaminophen (TYLENOL) 650 MG CR tablet Take 650 mg by mouth daily as needed for pain. Reported on 11/29/2015  . allopurinol (ZYLOPRIM) 300 MG tablet Take 300 mg by mouth every other day. Take in the evening - on even days of the month  . amLODipine (NORVASC) 10 MG tablet Take 10 mg by mouth daily.  Marland Kitchen aspirin EC 81 MG tablet Take 81 mg by mouth at bedtime.   . Cholecalciferol (VITAMIN D3) 2000 UNITS TABS Take 2,000 Units by mouth daily.   . clonazePAM (KLONOPIN) 1 MG tablet Take 0.5 mg by mouth at bedtime.   . clopidogrel (PLAVIX) 75 MG tablet Take 75 mg by mouth daily.  . Coenzyme Q10 (EQL COQ10) 300 MG CAPS Take by mouth.  . esomeprazole (NEXIUM) 20 MG capsule Take 20 mg by mouth  daily at 12 noon.  . fluticasone (FLONASE) 50 MCG/ACT nasal spray Place 2 sprays into both nostrils daily as needed for allergies.   . Glucose Blood (FREESTYLE TEST VI)   . ibuprofen (ADVIL,MOTRIN) 200 MG tablet Take 200 mg by mouth as needed.  Marland Kitchen JANUVIA 50 MG tablet Take 50 mg by mouth daily.  Marland Kitchen levothyroxine (SYNTHROID, LEVOTHROID) 175 MCG tablet Take 175 mcg by mouth daily before breakfast.  . loratadine-pseudoephedrine (CLARITIN-D 24-HOUR) 10-240 MG per 24 hr tablet Take 1 tablet by mouth at bedtime.   . metoprolol succinate (TOPROL-XL) 100 MG 24 hr tablet Take 1 tablet (100 mg total) by mouth  daily. Take with or immediately following a meal.  . Multiple Vitamin (MULTIVITAMIN WITH MINERALS) TABS Take 1 tablet by mouth at bedtime.   . Multiple Vitamins-Minerals (PRESERVISION AREDS 2) CAPS Take 1 capsule by mouth 2 (two) times daily.  Marland Kitchen olmesartan-hydrochlorothiazide (BENICAR HCT) 40-25 MG tablet Take 1 tablet by mouth daily.  . Omega-3 Fatty Acids (FISH OIL) 1200 MG CAPS Take 1,200 mg by mouth 2 (two) times daily.  . rosuvastatin (CRESTOR) 10 MG tablet Take 10 mg by mouth as directed. Per patient, takes Mondays & Thursdays  . spironolactone (ALDACTONE) 25 MG tablet Take 12.5 mg by mouth every evening.   . Tamsulosin HCl (FLOMAX) 0.4 MG CAPS Take 0.4 mg by mouth every other day. Take in the evening - on even days of the month  . triamcinolone cream (KENALOG) 0.1 % Apply 1 application topically as needed.  . vitamin B-12 (CYANOCOBALAMIN) 1000 MCG tablet Take 2,500 mcg by mouth at bedtime.      Allergies:   Ace inhibitors; Crestor [rosuvastatin]; and Nitroglycerin   Social History   Socioeconomic History  . Marital status: Divorced    Spouse name: Not on file  . Number of children: Not on file  . Years of education: Not on file  . Highest education level: Not on file  Occupational History  . Not on file  Social Needs  . Financial resource strain: Not on file  . Food insecurity:     Worry: Not on file    Inability: Not on file  . Transportation needs:    Medical: Not on file    Non-medical: Not on file  Tobacco Use  . Smoking status: Former Smoker    Types: Cigarettes    Last attempt to quit: 08/01/1970    Years since quitting: 48.3  . Smokeless tobacco: Never Used  Substance and Sexual Activity  . Alcohol use: Yes    Alcohol/week: 6.0 - 9.0 standard drinks    Types: 4 - 5 Glasses of wine, 2 - 4 Cans of beer per week    Comment: 1-3 glasses 3-4 times a week  . Drug use: No  . Sexual activity: Not on file  Lifestyle  . Physical activity:    Days per week: Not on file    Minutes per session: Not on file  . Stress: Not on file  Relationships  . Social connections:    Talks on phone: Not on file    Gets together: Not on file    Attends religious service: Not on file    Active member of club or organization: Not on file    Attends meetings of clubs or organizations: Not on file    Relationship status: Not on file  Other Topics Concern  . Not on file  Social History Narrative  . Not on file     Family History: The patient's family history includes Deep vein thrombosis in his mother; Diabetes in his maternal grandmother, sister, and sister; Heart attack in his father and sister; Heart disease in his father, paternal uncle, and sister; Hyperlipidemia in his father and sister; Hypertension in his father and sister; Stroke in his mother.  ROS:   Please see the history of present illness.    All other systems reviewed and are negative.  EKGs/Labs/Other Studies Reviewed:    The following studies were reviewed today: Rhythm strips reveal atrial fibrillation with elevated ventricular rate.   Recent Labs: No results found for requested labs within last 8760 hours.  Recent Lipid Panel    Component Value Date/Time   CHOL 195 01/08/2015 0404   TRIG 318 (H) 01/08/2015 0404   HDL 29 (L) 01/08/2015 0404   CHOLHDL 6.7 01/08/2015 0404   VLDL 64 (H) 01/08/2015  0404   LDLCALC 102 (H) 01/08/2015 0404    Physical Exam:    VS:  BP 110/70 (BP Location: Left Arm, Patient Position: Sitting)   Pulse (!) 113   Resp 16   Ht 5\' 10"  (1.778 m)   Wt 200 lb (90.7 kg)   SpO2 95%   BMI 28.70 kg/m     Wt Readings from Last 3 Encounters:  12/02/18 200 lb (90.7 kg)  11/26/18 202 lb (91.6 kg)  09/06/18 200 lb (90.7 kg)     GEN: Patient is in no acute distress HEENT: Normal NECK: No JVD; No carotid bruits LYMPHATICS: No lymphadenopathy CARDIAC: Hear sounds irregular, 2/6 systolic murmur at the apex. RESPIRATORY:  Clear to auscultation without rales, wheezing or rhonchi  ABDOMEN: Soft, non-tender, non-distended MUSCULOSKELETAL:  No edema; No deformity  SKIN: Warm and dry NEUROLOGIC:  Alert and oriented x 3 PSYCHIATRIC:  Normal affect   Signed, Garrett Lindau, MD  12/02/2018 3:33 PM    [facility]

## 2018-12-02 NOTE — Telephone Encounter (Signed)
Patient took his diabetic meds, is this ok before stress test

## 2018-12-02 NOTE — Telephone Encounter (Signed)
Patient reports taking his Januvia today even though he wasn't supposed to before the stress echo. I verified with Dr. Geraldo Pitter. He said to have patient eat now and still come for stress echo at 1:15 pm. Patient informed and verbally understands

## 2018-12-02 NOTE — Patient Instructions (Signed)
Medication Instructions:  Your physician has recommended you make the following change in your medication:   Stop: Aspirin   If you need a refill on your cardiac medications before your next appointment, please call your pharmacy.   Lab work: Your physician recommends that you return for lab work today: Bmp, cbc, tsh, lft, and lipids  If you have labs (blood work) drawn today and your tests are completely normal, you will receive your results only by: Marland Kitchen MyChart Message (if you have MyChart) OR . A paper copy in the mail If you have any lab test that is abnormal or we need to change your treatment, we will call you to review the results.  Testing/Procedures: None.   Follow-Up: At [facility], you and your health needs are our priority.  As part of our continuing mission to provide you with exceptional heart care, we have created designated Provider Care Teams.  These Care Teams include your primary Cardiologist (physician) and Advanced Practice Providers (APPs -  Physician Assistants and Nurse Practitioners) who all work together to provide you with the care you need, when you need it. You will need a follow up appointment in 2 days.  Please call our office 2 months in advance to schedule this appointment.  You may see No primary care provider on file. or another member of our [employer] in [place]: Jenne Campus, MD . Shirlee More, MD  Any Other Special Instructions Will Be Listed Below (If Applicable).

## 2018-12-03 ENCOUNTER — Telehealth: Payer: Self-pay | Admitting: Emergency Medicine

## 2018-12-03 LAB — BASIC METABOLIC PANEL
BUN/Creatinine Ratio: 15 (ref 10–24)
BUN: 26 mg/dL (ref 8–27)
CHLORIDE: 100 mmol/L (ref 96–106)
CO2: 22 mmol/L (ref 20–29)
CREATININE: 1.68 mg/dL — AB (ref 0.76–1.27)
Calcium: 10.5 mg/dL — ABNORMAL HIGH (ref 8.6–10.2)
GFR calc Af Amer: 45 mL/min/{1.73_m2} — ABNORMAL LOW (ref >59)
GFR calc non Af Amer: 39 mL/min/{1.73_m2} — ABNORMAL LOW (ref >59)
GLUCOSE: 140 mg/dL — AB (ref 65–99)
Potassium: 5.1 mmol/L (ref 3.5–5.2)
SODIUM: 137 mmol/L (ref 134–144)

## 2018-12-03 LAB — HEPATIC FUNCTION PANEL
ALBUMIN: 4.5 g/dL (ref 3.7–4.7)
ALT: 24 IU/L (ref 0–44)
AST: 24 IU/L (ref 0–40)
Alkaline Phosphatase: 107 IU/L (ref 39–117)
BILIRUBIN TOTAL: 0.4 mg/dL (ref 0.0–1.2)
Bilirubin, Direct: 0.15 mg/dL (ref 0.00–0.40)
TOTAL PROTEIN: 7.3 g/dL (ref 6.0–8.5)

## 2018-12-03 LAB — CBC
Hematocrit: 44.5 % (ref 37.5–51.0)
Hemoglobin: 15.5 g/dL (ref 13.0–17.7)
MCH: 31.1 pg (ref 26.6–33.0)
MCHC: 34.8 g/dL (ref 31.5–35.7)
MCV: 89 fL (ref 79–97)
PLATELETS: 199 10*3/uL (ref 150–450)
RBC: 4.98 x10E6/uL (ref 4.14–5.80)
RDW: 12.8 % (ref 11.6–15.4)
WBC: 8.3 10*3/uL (ref 3.4–10.8)

## 2018-12-03 LAB — TSH: TSH: 0.733 u[IU]/mL (ref 0.450–4.500)

## 2018-12-03 MED ORDER — APIXABAN 5 MG PO TABS: 5.0000 mg | ORAL_TABLET | Freq: Two times a day (BID) | ORAL | 1 refills | Status: DC

## 2018-12-03 NOTE — Telephone Encounter (Signed)
Per Dr. Geraldo Pitter patient informed to start eliquis 5 mg twice daily, also informed patient again to stop aspirin. Patient verbally understands. Prescriptions sent in.

## 2018-12-04 ENCOUNTER — Telehealth: Payer: Self-pay

## 2018-12-04 ENCOUNTER — Encounter (HOSPITAL_COMMUNITY)
Admission: RE | Admit: 2018-12-04 | Discharge: 2018-12-04 | Disposition: A | Discharge status: Home / Self Care | Payer: Self-pay | Source: Ambulatory Visit | Attending: Cardiology | Admitting: Cardiology

## 2018-12-04 ENCOUNTER — Ambulatory Visit (INDEPENDENT_AMBULATORY_CARE_PROVIDER_SITE_OTHER): Payer: Medicare Other | Admitting: Cardiology

## 2018-12-04 ENCOUNTER — Encounter: Payer: Self-pay | Admitting: Cardiology

## 2018-12-04 ENCOUNTER — Ambulatory Visit (HOSPITAL_BASED_OUTPATIENT_CLINIC_OR_DEPARTMENT_OTHER)
Admission: RE | Admit: 2018-12-04 | Discharge: 2018-12-04 | Disposition: A | Discharge status: Home / Self Care | Payer: Medicare Other | Source: Ambulatory Visit | Attending: Cardiology | Admitting: Cardiology

## 2018-12-04 VITALS — BP 120/70 | HR 62 | Ht 70.0 in | Wt 203.4 lb

## 2018-12-04 DIAGNOSIS — I480 Paroxysmal atrial fibrillation: Secondary | ICD-10-CM | POA: Diagnosis present

## 2018-12-04 DIAGNOSIS — Z951 Presence of aortocoronary bypass graft: Secondary | ICD-10-CM

## 2018-12-04 DIAGNOSIS — E782 Mixed hyperlipidemia: Secondary | ICD-10-CM | POA: Diagnosis not present

## 2018-12-04 DIAGNOSIS — I2510 Atherosclerotic heart disease of native coronary artery without angina pectoris: Secondary | ICD-10-CM

## 2018-12-04 MED ORDER — METOPROLOL SUCCINATE ER 50 MG PO TB24: 50.0000 mg | ORAL_TABLET | Freq: Every day | ORAL | 1 refills | Status: DC

## 2018-12-04 MED ORDER — AMIODARONE HCL 200 MG PO TABS: ORAL_TABLET | ORAL | 1 refills | Status: DC

## 2018-12-04 NOTE — Addendum Note (Signed)
Addended by: Ashok Norris on: 12/04/2018 09:14 AM   Modules accepted: Orders

## 2018-12-04 NOTE — Progress Notes (Signed)
Cardiology Office Note:    Date:  12/04/2018   ID:  Garrett LO Sr., DOB [birth year], MRN [id number]  PCP:  Antony Contras, MD  Cardiologist:  Jenean Lindau, MD   Referring MD: Antony Contras, MD    ASSESSMENT:    1. Paroxysmal atrial fibrillation (HCC)   2. Coronary artery disease involving native coronary artery of native heart without angina pectoris   3. S/P CABG (coronary artery bypass graft)   4. Mixed hyperlipidemia    PLAN:    In order of problems listed above:  1. I discussed my findings with the patient at extensive length.  I made the following recommendations to him. 2. Secondary prevention stressed.  Importance of compliance with diet and medication stressed. 3. I discussed with the patient atrial fibrillation, disease process. Management and therapy including rate and rhythm control, anticoagulation benefits and potential risks were discussed extensively with the patient. Patient had multiple questions which were answered to patient's satisfaction. 4. He is on anticoagulation now.  I have reduced his metoprolol succinate to 50 mg daily.  I will initiate him on amiodarone 400 mg for 2 weeks, then 200 mg for 2 weeks and then 100 mg daily.  Benefits and potential risks explained to him at length and he vocalized understanding.  Questions were answered to his satisfaction.  He will get chest x-ray.  Baseline pulmonary function tests in the next few days. 5. He takes rosuvastatin twice a week.  I told him we could switch him to something like [place].  He is going to get blood work with his primary care physician in the next week or 2 and he will bring those blood work results and I will do accordingly. 6. Also in view of his renal insufficiency I think we can reduce his diuretics but we will do that after he gets his blood work from his primary care physician and I reviewed. 7. Follow-up appointment in 2 to 3 weeks or earlier if he has any concerns.   Medication  Adjustments/Labs and Tests Ordered: Current medicines are reviewed at length with the patient today.  Concerns regarding medicines are outlined above.  No orders of the defined types were placed in this encounter.  No orders of the defined types were placed in this encounter.    Chief Complaint  Patient presents with  . Follow-up     History of Present Illness:    Garrett Guia. is a 78 y.o. male.  Patient has known coronary artery disease.  He was being evaluated for a sense of fatigue.  He came in for a stress test and was found to be in atrial fibrillation with rapid ventricular rate.  Subsequently he we initiated him on anticoagulation.  Today he is here for follow-up and is feeling better.  He is in sinus rhythm.  He denies any chest pain orthopnea or PND.  Past Medical History:  Diagnosis Date  . AAA (abdominal aortic aneurysm) ([place])   . Arthritis    knees  . Atrial fibrillation ([place])   . CAD (coronary artery disease)   . Cancer [facility])    prostate  . GERD (gastroesophageal reflux disease)   . Gout   . Hyperlipidemia   . Hypertension   . Hypothyroidism   . Kidney stone    right kidney  . Myocardial infarction ([place]) 01/1991   sp inferior  . Nerve compression    right leg  . Reflux   . Seasonal allergies   .  Thyroid disease    hypothyroidism    Past Surgical History:  Procedure Laterality Date  . ABDOMINAL AORTAGRAM N/A 02/28/2012   Procedure: ABDOMINAL Maxcine Ham;  Surgeon: Serafina Mitchell, MD;  Location: [facility] CATH LAB;  Service: Cardiovascular;  Laterality: N/A;  . abdominal aortagram embolization  02/28/2012  . ABDOMINAL AORTIC ANEURYSM REPAIR  Sept. 2013  . CORONARY ARTERY BYPASS GRAFT  04/2009  . [place] and  2009   RCA  . EMBOLIZATION Right 02/28/2012   Procedure: EMBOLIZATION;  Surgeon: Serafina Mitchell, MD;  Location: [facility] CATH LAB;  Service: Cardiovascular;  Laterality: Right;  . IR GENERIC HISTORICAL  12/05/2016   IR RADIOLOGIST EVAL &  MGMT 12/05/2016 Jacqulynn Cadet, MD [facility] INTERV RAD  . KIDNEY STONE SURGERY    . LEFT HEART CATHETERIZATION WITH CORONARY/GRAFT ANGIOGRAM N/A 01/08/2015   Procedure: LEFT HEART CATHETERIZATION WITH Beatrix Fetters;  Surgeon: Jacolyn Reedy, MD;  Location: [facility] CATH LAB;  Service: Cardiovascular;  Laterality: N/A;  . parathyroid adenoma    . PROSTATE SURGERY     rad seeds  . SPINE SURGERY    . TONSILLECTOMY      Current Medications: Current Meds  Medication Sig  . acetaminophen (TYLENOL) 650 MG CR tablet Take 650 mg by mouth daily as needed for pain. Reported on 11/29/2015  . allopurinol (ZYLOPRIM) 300 MG tablet Take 300 mg by mouth every other day. Take in the evening - on even days of the month  . amLODipine (NORVASC) 10 MG tablet Take 10 mg by mouth daily.  Marland Kitchen apixaban (ELIQUIS) 5 MG TABS tablet Take 1 tablet (5 mg total) by mouth 2 (two) times daily.  . Cholecalciferol (VITAMIN D3) 2000 UNITS TABS Take 2,000 Units by mouth daily.   . clonazePAM (KLONOPIN) 1 MG tablet Take 0.5 mg by mouth at bedtime.   . clopidogrel (PLAVIX) 75 MG tablet Take 75 mg by mouth daily.  . Coenzyme Q10 (EQL COQ10) 300 MG CAPS Take by mouth.  . esomeprazole (NEXIUM) 20 MG capsule Take 20 mg by mouth daily at 12 noon.  . fluticasone (FLONASE) 50 MCG/ACT nasal spray Place 2 sprays into both nostrils daily as needed for allergies.   . Glucose Blood (FREESTYLE TEST VI)   . ibuprofen (ADVIL,MOTRIN) 200 MG tablet Take 200 mg by mouth as needed.  Marland Kitchen JANUVIA 50 MG tablet Take 50 mg by mouth daily.  Marland Kitchen levothyroxine (SYNTHROID, LEVOTHROID) 175 MCG tablet Take 175 mcg by mouth daily before breakfast.  . loratadine-pseudoephedrine (CLARITIN-D 24-HOUR) 10-240 MG per 24 hr tablet Take 1 tablet by mouth at bedtime.   . metoprolol succinate (TOPROL-XL) 100 MG 24 hr tablet Take 1 tablet (100 mg total) by mouth daily. Take with or immediately following a meal.  . Multiple Vitamin (MULTIVITAMIN WITH MINERALS) TABS Take  1 tablet by mouth at bedtime.   . Multiple Vitamins-Minerals (PRESERVISION AREDS 2) CAPS Take 1 capsule by mouth 2 (two) times daily.  Marland Kitchen olmesartan-hydrochlorothiazide (BENICAR HCT) 40-25 MG tablet Take 1 tablet by mouth daily.  . Omega-3 Fatty Acids (FISH OIL) 1200 MG CAPS Take 1,200 mg by mouth 2 (two) times daily.  . rosuvastatin (CRESTOR) 10 MG tablet Take 10 mg by mouth as directed. Per patient, takes Mondays & Thursdays  . spironolactone (ALDACTONE) 25 MG tablet Take 12.5 mg by mouth every evening.   . Tamsulosin HCl (FLOMAX) 0.4 MG CAPS Take 0.4 mg by mouth every other day. Take in the evening - on even  days of the month  . triamcinolone cream (KENALOG) 0.1 % Apply 1 application topically as needed.  . vitamin B-12 (CYANOCOBALAMIN) 1000 MCG tablet Take 2,500 mcg by mouth at bedtime.      Allergies:   Ace inhibitors; Crestor [rosuvastatin]; and Nitroglycerin   Social History   Socioeconomic History  . Marital status: Divorced    Spouse name: Not on file  . Number of children: Not on file  . Years of education: Not on file  . Highest education level: Not on file  Occupational History  . Not on file  Social Needs  . Financial resource strain: Not on file  . Food insecurity:    Worry: Not on file    Inability: Not on file  . Transportation needs:    Medical: Not on file    Non-medical: Not on file  Tobacco Use  . Smoking status: Former Smoker    Types: Cigarettes    Last attempt to quit: 08/01/1970    Years since quitting: 48.3  . Smokeless tobacco: Never Used  Substance and Sexual Activity  . Alcohol use: Yes    Alcohol/week: 6.0 - 9.0 standard drinks    Types: 4 - 5 Glasses of wine, 2 - 4 Cans of beer per week    Comment: 1-3 glasses 3-4 times a week  . Drug use: No  . Sexual activity: Not on file  Lifestyle  . Physical activity:    Days per week: Not on file    Minutes per session: Not on file  . Stress: Not on file  Relationships  . Social connections:     Talks on phone: Not on file    Gets together: Not on file    Attends religious service: Not on file    Active member of club or organization: Not on file    Attends meetings of clubs or organizations: Not on file    Relationship status: Not on file  Other Topics Concern  . Not on file  Social History Narrative  . Not on file     Family History: The patient's family history includes Deep vein thrombosis in his mother; Diabetes in his maternal grandmother, sister, and sister; Heart attack in his father and sister; Heart disease in his father, paternal uncle, and sister; Hyperlipidemia in his father and sister; Hypertension in his father and sister; Stroke in his mother.  ROS:   Please see the history of present illness.    All other systems reviewed and are negative.  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  Sinus bradycardia.   Recent Labs: 12/02/2018: ALT 24; BUN 26; Creatinine, Ser 1.68; Hemoglobin 15.5; Platelets 199; Potassium 5.1; Sodium 137; TSH 0.733  Recent Lipid Panel    Component Value Date/Time   CHOL 195 01/08/2015 0404   TRIG 318 (H) 01/08/2015 0404   HDL 29 (L) 01/08/2015 0404   CHOLHDL 6.7 01/08/2015 0404   VLDL 64 (H) 01/08/2015 0404   LDLCALC 102 (H) 01/08/2015 0404    Physical Exam:    VS:  BP 120/70   Pulse 62   Ht 5\' 10"  (1.778 m)   Wt 203 lb 6.4 oz (92.3 kg)   SpO2 94%   BMI 29.18 kg/m     Wt Readings from Last 3 Encounters:  12/04/18 203 lb 6.4 oz (92.3 kg)  12/02/18 200 lb (90.7 kg)  11/26/18 202  lb (91.6 kg)     GEN: Patient is in no acute distress HEENT: Normal NECK: No JVD; No carotid bruits LYMPHATICS: No lymphadenopathy CARDIAC: Hear sounds regular, 2/6 systolic murmur at the apex. RESPIRATORY:  Clear to auscultation without rales, wheezing or rhonchi  ABDOMEN: Soft, non-tender, non-distended MUSCULOSKELETAL:   No edema; No deformity  SKIN: Warm and dry NEUROLOGIC:  Alert and oriented x 3 PSYCHIATRIC:  Normal affect   Signed, Jenean Lindau, MD  12/04/2018 8:57 AM    Why

## 2018-12-04 NOTE — Patient Instructions (Addendum)
Medication Instructions:  Your physician has recommended you make the following change in your medication:   Decrease: Metoprolol succinate to 50 mg daily  Start: Amiodarone 400 mg daily for 2 weeks, then 200 mg daily for 2 more weeks, then 100 mg daily.  If you need a refill on your cardiac medications before your next appointment, please call your pharmacy.   Lab work: None.  If you have labs (blood work) drawn today and your tests are completely normal, you will receive your results only by: Marland Kitchen MyChart Message (if you have MyChart) OR . A paper copy in the mail If you have any lab test that is abnormal or we need to change your treatment, we will call you to review the results.  Testing/Procedures: A chest x-ray takes a picture of the organs and structures inside the chest, including the heart, lungs, and blood vessels. This test can show several things, including, whether the heart is enlarges; whether fluid is building up in the lungs; and whether pacemaker / defibrillator leads are still in place.  Your physician has recommended that you have a pulmonary function test. Pulmonary Function Tests are a group of tests that measure how well air moves in and out of your lungs.    Follow-Up: At [facility], you and your health needs are our priority.  As part of our continuing mission to provide you with exceptional heart care, we have created designated Provider Care Teams.  These Care Teams include your primary Cardiologist (physician) and Advanced Practice Providers (APPs -  Physician Assistants and Nurse Practitioners) who all work together to provide you with the care you need, when you need it. You will need a follow up appointment in 2 weeks.  Please call our office 2 months in advance to schedule this appointment.  You may see No primary care provider on file. or another member of our [employer] in [place] [facility] [place]: Jenne Campus, MD . Shirlee More, MD  Any Other  Special Instructions Will Be Listed Below (If Applicable).  Amiodarone tablets What is this medicine? AMIODARONE (a MEE oh da rone) is an antiarrhythmic drug. It helps make your heart beat regularly. Because of the side effects caused by this medicine, it is only used when other medicines have not worked. It is usually used for heartbeat problems that may be life threatening. This medicine may be used for other purposes; ask your health care provider or pharmacist if you have questions. COMMON BRAND NAME(S): Cordarone, Pacerone What should I tell my health care provider before I take this medicine? They need to know if you have any of these conditions: -liver disease -lung disease -other heart problems -thyroid disease -an unusual or allergic reaction to amiodarone, iodine, other medicines, foods, dyes, or preservatives -pregnant or trying to get pregnant -breast-feeding How should I use this medicine? Take this medicine by mouth with a glass of water. Follow the directions on the prescription label. You can take this medicine with or without food. However, you should always take it the same way each time. Take your doses at regular intervals. Do not take your medicine more often than directed. Do not stop taking except on the advice of your doctor or health care professional. A special MedGuide will be given to you by the pharmacist with each prescription and refill. Be sure to read this information carefully each time. Talk to your pediatrician regarding the use of this medicine in children. Special care may be needed. Overdosage:  If you think you have taken too much of this medicine contact a poison control center or emergency room at once. NOTE: This medicine is only for you. Do not share this medicine with others. What if I miss a dose? If you miss a dose, take it as soon as you can. If it is almost time for your next dose, take only that dose. Do not take double or extra doses. What may  interact with this medicine? Do not take this medicine with any of the following medications: -abarelix -apomorphine -arsenic trioxide -certain antibiotics like erythromycin, gemifloxacin, levofloxacin, pentamidine -certain medicines for depression like amoxapine, tricyclic antidepressants -certain medicines for fungal infections like fluconazole, itraconazole, ketoconazole, posaconazole, voriconazole -certain medicines for irregular heart beat like disopyramide, dofetilide, dronedarone, ibutilide, propafenone, sotalol -certain medicines for malaria like chloroquine, halofantrine -cisapride -droperidol -haloperidol -hawthorn -maprotiline -methadone -phenothiazines like chlorpromazine, mesoridazine, thioridazine -pimozide -ranolazine -red yeast rice -vardenafil -ziprasidone This medicine may also interact with the following medications: -antiviral medicines for HIV or AIDS -certain medicines for blood pressure, heart disease, irregular heart beat -certain medicines for cholesterol like atorvastatin, cerivastatin, lovastatin, simvastatin -certain medicines for hepatitis C like sofosbuvir and ledipasvir; sofosbuvir -certain medicines for seizures like phenytoin -certain medicines for thyroid problems -certain medicines that treat or prevent blood clots like warfarin -cholestyramine -cimetidine -clopidogrel -cyclosporine -dextromethorphan -diuretics -fentanyl -general anesthetics -grapefruit juice -lidocaine -loratadine -methotrexate -other medicines that prolong the QT interval (cause an abnormal heart rhythm) -procainamide -quinidine -rifabutin, rifampin, or rifapentine -St. John's Wort -trazodone This list may not describe all possible interactions. Give your health care provider a list of all the medicines, herbs, non-prescription drugs, or dietary supplements you use. Also tell them if you smoke, drink alcohol, or use illegal drugs. Some items may interact with your  medicine. What should I watch for while using this medicine? Your condition will be monitored closely when you first begin therapy. Often, this drug is first started in a hospital or other monitored health care setting. Once you are on maintenance therapy, visit your doctor or health care professional for regular checks on your progress. Because your condition and use of this medicine carry some risk, it is a good idea to carry an identification card, necklace or bracelet with details of your condition, medications, and doctor or health care professional. Dennis Bast may get drowsy or dizzy. Do not drive, use machinery, or do anything that needs mental alertness until you know how this medicine affects you. Do not stand or sit up quickly, especially if you are an older patient. This reduces the risk of dizzy or fainting spells. This medicine can make you more sensitive to the sun. Keep out of the sun. If you cannot avoid being in the sun, wear protective clothing and use sunscreen. Do not use sun lamps or tanning beds/booths. You should have regular eye exams before and during treatment. Call your doctor if you have blurred vision, see halos, or your eyes become sensitive to light. Your eyes may get dry. It may be helpful to use a lubricating eye solution or artificial tears solution. If you are going to have surgery or a procedure that requires contrast dyes, tell your doctor or health care professional that you are taking this medicine. What side effects may I notice from receiving this medicine? Side effects that you should report to your doctor or health care professional as soon as possible: -allergic reactions like skin rash, itching or hives, swelling of the face, lips, or tongue -blue-gray coloring  of the skin -blurred vision, seeing blue green halos, increased sensitivity of the eyes to light -breathing problems -chest pain -dark urine -fast, irregular heartbeat -feeling faint or  light-headed -intolerance to heat or cold -nausea or vomiting -pain and swelling of the scrotum -pain, tingling, numbness in feet, hands -redness, blistering, peeling or loosening of the skin, including inside the mouth -spitting up blood -stomach pain -sweating -unusual or uncontrolled movements of body -unusually weak or tired -weight gain or loss -yellowing of the eyes or skin Side effects that usually do not require medical attention (report to your doctor or health care professional if they continue or are bothersome): -change in sex drive or performance -constipation -dizziness -headache -loss of appetite -trouble sleeping This list may not describe all possible side effects. Call your doctor for medical advice about side effects. You may report side effects to FDA at 1-800-FDA-1088. Where should I keep my medicine? Keep out of the reach of children. Store at room temperature between 20 and 25 degrees C (68 and 77 degrees F). Protect from light. Keep container tightly closed. Throw away any unused medicine after the expiration date. NOTE: This sheet is a summary. It may not cover all possible information. If you have questions about this medicine, talk to your doctor, pharmacist, or health care provider.  2019 Elsevier/Gold Standard (2014-02-02 19:48:11)   Pulmonary Function Tests Pulmonary function tests (PFTs) are used to measure how well your lungs work, find out what is causing your lung problems, and figure out the best treatment for you. You may have PFTs:  When you have an illness involving the lungs.  To follow changes in your lung function over time if you have a chronic lung disease.  If you are an Nature conservation officer. This checks the effects of being exposed to chemicals over a long period of time.  To check lung function before having surgery or other procedures.  To check your lungs if you smoke.  To check if prescribed medicines or treatments are  helping your lungs. Your results will be compared to the expected lung function of someone with healthy lungs who is similar to you in:  Age.  Gender.  Height.  Weight.  Race or ethnicity. This is done to show how your lungs compare to normal lung function (percent predicted). This is how your health care provider knows if your lung function is normal or not. If you have had PFTs done before, your health care provider will compare your current results with past results. This shows if your lung function is better, worse, or the same as before. Tell a health care provider about:  Any allergies you have.  All medicines you are taking, including inhaler or nebulizer medicines, vitamins, herbs, eye drops, creams, and over-the-counter medicines.  Any blood disorders you have.  Any surgeries you have had, especially recent eye surgery, abdominal surgery, or chest surgery. These can make PFTs difficult or unsafe.  Any medical conditions you have, including chest pain or heart problems, tuberculosis, or respiratory infections such as pneumonia, a cold, or the flu.  Any fear of being in closed spaces (claustrophobia). Some of your tests may be in a closed space. What are the risks? Generally, this is a safe procedure. However, problems may occur, including:  Light-headedness due to over-breathing (hyperventilation).  An asthma attack from deep breathing.  A collapsed lung. What happens before the procedure?  Take over-the-counter and prescription medicines only as told by your health care provider. If  you take inhaler or nebulizer medicines, ask your health care provider which medicines you should take on the day of your testing. Some inhaler medicines may interfere with PFTs if they are taken shortly before the tests.  Follow your health care provider's instructions on eating and drinking restrictions. This may include avoiding eating large meals and drinking alcohol before the  testing.  Do not use any products that contain nicotine or tobacco, such as cigarettes and e-cigarettes. If you need help quitting, ask your health care provider.  Wear comfortable clothing that will not interfere with breathing. What happens during the procedure?   You will be given a soft nose clip to wear. This is done so all of your breaths will go through your mouth instead of your nose.  You will be given a germ-free (sterile) mouthpiece. It will be attached to a machine that measures your breathing (spirometer).  You will be asked to do various breathing maneuvers. The maneuvers will be done by breathing in (inhaling) and breathing out (exhaling). You may be asked to repeat the maneuvers several times before the testing is done.  It is important to follow the instructions exactly to get accurate results. Make sure to blow as hard and as fast as you can when you are told to do so.  You may be given a medicine that makes the small air passages in your lungs larger (bronchodilator) after testing has been done. This medicine will make it easier for you to breathe.  The tests will be repeated after the bronchodilator has taken effect.  You will be monitored carefully during the procedure for faintness, dizziness, trouble breathing, or any other problems. The procedure may vary among health care providers and hospitals. What happens after the procedure?  It is up to you to get your test results. Ask your health care provider, or the department that is doing the tests, when your results will be ready. After you have received your test results, talk with your health care provider about treatment options, if necessary. Summary  Pulmonary function tests (PFTs) are used to measure how well your lungs work, find out what is causing your lung problems, and figure out the best treatment for you.  Wear comfortable clothing that will not interfere with breathing.  It is up to you to get your  test results. After you have received them, talk with your health care provider about treatment options, if necessary. This information is not intended to replace advice given to you by your health care provider. Make sure you discuss any questions you have with your health care provider. Document Released: 06/22/2004 Document Revised: 09/21/2016 Document Reviewed: 09/21/2016 Elsevier Interactive Patient Education  2019 [place].   Chest X-Ray  A chest X-ray is a painless test that uses radiation to create images of the structures inside of your chest. Chest X-rays are used to look for many health conditions, including heart failure, pneumonia, tuberculosis, rib fractures, breathing disorders, and cancer. They may be used to diagnose chest pain, constant coughing, or trouble breathing. Tell a health care provider about:  Any allergies you have.  All medicines you are taking, including vitamins, herbs, eye drops, creams, and over-the-counter medicines.  Any surgeries you have had.  Any medical conditions you have.  Whether you are pregnant or may be pregnant. What are the risks? Getting a chest X-ray is a safe procedure. However, you will be exposed to a small amount of radiation. Being exposed to too much radiation  over a lifetime can increase the risk of cancer. This risk is small, but it may occur if you have many X-rays throughout your life. What happens before the procedure?  You may be asked to remove glasses, jewelry, and any other metal objects.  You will be asked to undress from the waist up. You may be given a hospital gown to wear.  You may be asked to wear a protective lead apron to protect parts of your body from radiation. What happens during the procedure?  You will be asked to stand still as each picture is taken to get the best possible images.  You will be asked to take a deep breath and hold your breath for a few seconds.  The X-ray machine will create a  picture of your chest using a tiny burst of radiation. This is painless.  More pictures may be taken from other angles. Typically, one picture will be taken while you face the X-ray camera, and another picture will be taken from the side while you stand. If you cannot stand, you may be asked to lie down. The procedure may vary among health care providers and hospitals. What happens after the procedure?  The X-ray(s) will be reviewed by your health care provider or an X-ray (radiology) specialist.  It is up to you to get your test results. Ask your health care provider, or the department that is doing the test, when your results will be ready.  Your health care provider will tell you if you need more tests or a follow-up exam. Keep all follow-up visits as told by your health care provider. This is important. Summary  A chest X-ray is a safe, painless test that is used to examine the inside of the chest, heart, and lungs.  You will need to undress from the waist up and remove jewelry and metal objects before the procedure.  You will be exposed to a small amount of radiation during the procedure.  The X-ray machine will take one or more pictures of your chest while you remain as still as possible.  Later, a health care provider or specialist will review the test results with you. This information is not intended to replace advice given to you by your health care provider. Make sure you discuss any questions you have with your health care provider. Document Released: 12/26/2016 Document Revised: 12/26/2016 Document Reviewed: 12/26/2016 Elsevier Interactive Patient Education  [employer].

## 2018-12-04 NOTE — Telephone Encounter (Signed)
-----   Message from Jenean Lindau, MD sent at 12/04/2018 11:33 AM EST ----- The results of the study is unremarkable. Please inform patient. I will discuss in detail at next appointment. [facility]c  primary care/referring physician Jenean Lindau, MD 12/04/2018 11:33 AM

## 2018-12-04 NOTE — Telephone Encounter (Signed)
Patient called and notified of test results. 

## 2018-12-05 ENCOUNTER — Other Ambulatory Visit (HOSPITAL_BASED_OUTPATIENT_CLINIC_OR_DEPARTMENT_OTHER): Payer: Medicare Other

## 2018-12-05 NOTE — Telephone Encounter (Signed)
Pt called wanting to know if Amiodarone had been called in. I informed him it had been sent in yesterday to his pharmacy.

## 2018-12-06 ENCOUNTER — Encounter (HOSPITAL_COMMUNITY)
Admission: RE | Admit: 2018-12-06 | Discharge: 2018-12-06 | Disposition: A | Discharge status: Home / Self Care | Payer: Self-pay | Source: Ambulatory Visit | Attending: Cardiology | Admitting: Cardiology

## 2018-12-06 LAB — FECAL OCCULT BLOOD, IMMUNOCHEMICAL: Fecal Occult Bld: NEGATIVE

## 2018-12-09 ENCOUNTER — Encounter (HOSPITAL_COMMUNITY)
Admission: RE | Admit: 2018-12-09 | Discharge: 2018-12-09 | Disposition: A | Discharge status: Home / Self Care | Payer: Self-pay | Source: Ambulatory Visit | Attending: Cardiology | Admitting: Cardiology

## 2018-12-10 ENCOUNTER — Other Ambulatory Visit: Payer: Self-pay | Admitting: Family Medicine

## 2018-12-10 ENCOUNTER — Ambulatory Visit
Admission: RE | Admit: 2018-12-10 | Discharge: 2018-12-10 | Disposition: A | Discharge status: Home / Self Care | Payer: Medicare Other | Source: Ambulatory Visit | Attending: Family Medicine | Admitting: Family Medicine

## 2018-12-10 DIAGNOSIS — R109 Unspecified abdominal pain: Secondary | ICD-10-CM

## 2018-12-11 ENCOUNTER — Encounter (HOSPITAL_COMMUNITY)
Admission: RE | Admit: 2018-12-11 | Discharge: 2018-12-11 | Disposition: A | Discharge status: Home / Self Care | Payer: Self-pay | Source: Ambulatory Visit | Attending: Cardiology | Admitting: Cardiology

## 2018-12-12 ENCOUNTER — Other Ambulatory Visit: Payer: Self-pay | Admitting: Cardiothoracic Surgery

## 2018-12-12 DIAGNOSIS — I712 Thoracic aortic aneurysm, without rupture, unspecified: Secondary | ICD-10-CM

## 2018-12-13 ENCOUNTER — Encounter (HOSPITAL_COMMUNITY): Payer: Self-pay

## 2018-12-16 ENCOUNTER — Encounter (HOSPITAL_COMMUNITY)
Admission: RE | Admit: 2018-12-16 | Discharge: 2018-12-16 | Disposition: A | Discharge status: Home / Self Care | Payer: Self-pay | Source: Ambulatory Visit | Attending: Cardiology | Admitting: Cardiology

## 2018-12-16 DIAGNOSIS — I712 Thoracic aortic aneurysm, without rupture: Secondary | ICD-10-CM | POA: Insufficient documentation

## 2018-12-17 ENCOUNTER — Encounter: Payer: Self-pay | Admitting: Cardiology

## 2018-12-17 ENCOUNTER — Ambulatory Visit (INDEPENDENT_AMBULATORY_CARE_PROVIDER_SITE_OTHER): Payer: Medicare Other | Admitting: Cardiology

## 2018-12-17 VITALS — BP 138/66 | HR 67 | Ht 70.0 in | Wt 201.0 lb

## 2018-12-17 DIAGNOSIS — Z79899 Other long term (current) drug therapy: Secondary | ICD-10-CM

## 2018-12-17 DIAGNOSIS — I480 Paroxysmal atrial fibrillation: Secondary | ICD-10-CM

## 2018-12-17 DIAGNOSIS — I2510 Atherosclerotic heart disease of native coronary artery without angina pectoris: Secondary | ICD-10-CM | POA: Diagnosis not present

## 2018-12-17 DIAGNOSIS — Z951 Presence of aortocoronary bypass graft: Secondary | ICD-10-CM | POA: Diagnosis not present

## 2018-12-17 DIAGNOSIS — E782 Mixed hyperlipidemia: Secondary | ICD-10-CM | POA: Diagnosis not present

## 2018-12-17 NOTE — Addendum Note (Signed)
Addended by: Austin Miles on: 12/17/2018 09:47 AM   Modules accepted: Orders

## 2018-12-17 NOTE — Progress Notes (Signed)
Cardiology Office Note:    Date:  12/17/2018   ID:  Garrett Meek Sr., DOB November 26, 1940, MRN [id number]  PCP:  Antony Contras, MD  Cardiologist:  Jenean Lindau, MD   Referring MD: Antony Contras, MD    ASSESSMENT:    1. Coronary artery disease involving native coronary artery of native heart without angina pectoris   2. Mixed hyperlipidemia   3. Paroxysmal atrial fibrillation (HCC)   4. S/P CABG (coronary artery bypass graft)    PLAN:    In order of problems listed above:  1. Secondary prevention stressed with the patient.  Importance of compliance with diet and medication stressed and he vocalized understanding. 2. I discussed with the patient atrial fibrillation, disease process. Management and therapy including rate and rhythm control, anticoagulation benefits and potential risks were discussed extensively with the patient. Patient had multiple questions which were answered to patient's satisfaction. 3. His atrial fibrillation is stable and he is in sinus rhythm.  He will have a Chem-7 today.  We will try to obtain the reports of the chest x-ray and see what is happening with the PFT scheduling. 4. He has an appointment on February 18 and he will keep that appointment.   Medication Adjustments/Labs and Tests Ordered: Current medicines are reviewed at length with the patient today.  Concerns regarding medicines are outlined above.  No orders of the defined types were placed in this encounter.  No orders of the defined types were placed in this encounter.    No chief complaint on file.    History of Present Illness:    Garrett Jordan. is a 78 y.o. male.  Patient has known coronary artery disease and paroxysmal atrial fibrillation.  He denies any problems at this time and takes care of activities of daily living.  No chest pain orthopnea PND or palpitations.  His monitor at home and his smart watch did not reveal any elevated heart rate since the past visit.  At the time of  my evaluation, the patient is alert awake oriented and in no distress.  Past Medical History:  Diagnosis Date  . AAA (abdominal aortic aneurysm) ([place])   . Arthritis    knees  . Atrial fibrillation ([place])   . CAD (coronary artery disease)   . Cancer [facility])    prostate  . GERD (gastroesophageal reflux disease)   . Gout   . Hyperlipidemia   . Hypertension   . Hypothyroidism   . Kidney stone    right kidney  . Myocardial infarction ([place]) 01/1991   sp inferior  . Nerve compression    right leg  . Reflux   . Seasonal allergies   . Thyroid disease    hypothyroidism    Past Surgical History:  Procedure Laterality Date  . ABDOMINAL AORTAGRAM N/A 02/28/2012   Procedure: ABDOMINAL Maxcine Ham;  Surgeon: Serafina Mitchell, MD;  Location: [facility] CATH LAB;  Service: Cardiovascular;  Laterality: N/A;  . abdominal aortagram embolization  02/28/2012  . ABDOMINAL AORTIC ANEURYSM REPAIR  Sept. 2013  . CORONARY ARTERY BYPASS GRAFT  04/2009  . [place] and  2009   RCA  . EMBOLIZATION Right 02/28/2012   Procedure: EMBOLIZATION;  Surgeon: Serafina Mitchell, MD;  Location: [facility] CATH LAB;  Service: Cardiovascular;  Laterality: Right;  . IR GENERIC HISTORICAL  12/05/2016   IR RADIOLOGIST EVAL & MGMT 12/05/2016 Jacqulynn Cadet, MD [facility] INTERV RAD  . KIDNEY STONE SURGERY    .  LEFT HEART CATHETERIZATION WITH CORONARY/GRAFT ANGIOGRAM N/A 01/08/2015   Procedure: LEFT HEART CATHETERIZATION WITH Beatrix Fetters;  Surgeon: Jacolyn Reedy, MD;  Location: [facility] CATH LAB;  Service: Cardiovascular;  Laterality: N/A;  . parathyroid adenoma    . PROSTATE SURGERY     rad seeds  . SPINE SURGERY    . TONSILLECTOMY      Current Medications: Current Meds  Medication Sig  . acetaminophen (TYLENOL) 650 MG CR tablet Take 650 mg by mouth daily as needed for pain. Reported on 11/29/2015  . allopurinol (ZYLOPRIM) 300 MG tablet Take 300 mg by mouth every other day. Take in the evening - on even days of  the month  . amiodarone (PACERONE) 200 MG tablet Take 400 mg daily for 2 weeks, then 200 mg daily for 2 more weeks, then 100mg  daily  . amLODipine (NORVASC) 10 MG tablet Take 10 mg by mouth daily.  Marland Kitchen apixaban (ELIQUIS) 5 MG TABS tablet Take 1 tablet (5 mg total) by mouth 2 (two) times daily.  . Cholecalciferol (VITAMIN D3) 2000 UNITS TABS Take 2,000 Units by mouth daily.   . clonazePAM (KLONOPIN) 1 MG tablet Take 0.5 mg by mouth at bedtime.   . clopidogrel (PLAVIX) 75 MG tablet Take 75 mg by mouth daily.  . Coenzyme Q10 (EQL COQ10) 300 MG CAPS Take by mouth.  . esomeprazole (NEXIUM) 20 MG capsule Take 20 mg by mouth daily at 12 noon.  . fluticasone (FLONASE) 50 MCG/ACT nasal spray Place 2 sprays into both nostrils daily as needed for allergies.   . Glucose Blood (FREESTYLE TEST VI)   . ibuprofen (ADVIL,MOTRIN) 200 MG tablet Take 200 mg by mouth as needed.  Marland Kitchen JANUVIA 50 MG tablet Take 50 mg by mouth daily.  Marland Kitchen levothyroxine (SYNTHROID, LEVOTHROID) 175 MCG tablet Take 175 mcg by mouth daily before breakfast.  . loratadine-pseudoephedrine (CLARITIN-D 24-HOUR) 10-240 MG per 24 hr tablet Take 1 tablet by mouth at bedtime.   . metoprolol succinate (TOPROL-XL) 50 MG 24 hr tablet Take 1 tablet (50 mg total) by mouth daily. Take with or immediately following a meal.  . Multiple Vitamin (MULTIVITAMIN WITH MINERALS) TABS Take 1 tablet by mouth at bedtime.   . Multiple Vitamins-Minerals (PRESERVISION AREDS 2) CAPS Take 1 capsule by mouth 2 (two) times daily.  Marland Kitchen olmesartan-hydrochlorothiazide (BENICAR HCT) 40-25 MG tablet Take 1 tablet by mouth daily.  . Omega-3 Fatty Acids (FISH OIL) 1200 MG CAPS Take 1,200 mg by mouth 2 (two) times daily.  . rosuvastatin (CRESTOR) 10 MG tablet Take 10 mg by mouth as directed. Per patient, takes Mondays & Thursdays  . spironolactone (ALDACTONE) 25 MG tablet Take 12.5 mg by mouth every evening.   . Tamsulosin HCl (FLOMAX) 0.4 MG CAPS Take 0.4 mg by mouth every other day.  Take in the evening - on even days of the month  . triamcinolone cream (KENALOG) 0.1 % Apply 1 application topically as needed.  . vitamin B-12 (CYANOCOBALAMIN) 1000 MCG tablet Take 2,500 mcg by mouth at bedtime.      Allergies:   Ace inhibitors; Crestor [rosuvastatin]; and Nitroglycerin   Social History   Socioeconomic History  . Marital status: Divorced    Spouse name: Not on file  . Number of children: Not on file  . Years of education: Not on file  . Highest education level: Not on file  Occupational History  . Not on file  Social Needs  . Financial resource strain: Not on file  .  Food insecurity:    Worry: Not on file    Inability: Not on file  . Transportation needs:    Medical: Not on file    Non-medical: Not on file  Tobacco Use  . Smoking status: Former Smoker    Types: Cigarettes    Last attempt to quit: 08/01/1970    Years since quitting: 48.4  . Smokeless tobacco: Never Used  Substance and Sexual Activity  . Alcohol use: Yes    Alcohol/week: 6.0 - 9.0 standard drinks    Types: 4 - 5 Glasses of wine, 2 - 4 Cans of beer per week    Comment: 1-3 glasses 3-4 times a week  . Drug use: No  . Sexual activity: Not on file  Lifestyle  . Physical activity:    Days per week: Not on file    Minutes per session: Not on file  . Stress: Not on file  Relationships  . Social connections:    Talks on phone: Not on file    Gets together: Not on file    Attends religious service: Not on file    Active member of club or organization: Not on file    Attends meetings of clubs or organizations: Not on file    Relationship status: Not on file  Other Topics Concern  . Not on file  Social History Narrative  . Not on file     Family History: The patient's family history includes Deep vein thrombosis in his mother; Diabetes in his maternal grandmother, sister, and sister; Heart attack in his father and sister; Heart disease in his father, paternal uncle, and sister;  Hyperlipidemia in his father and sister; Hypertension in his father and sister; Stroke in his mother.  ROS:   Please see the history of present illness.    All other systems reviewed and are negative.  EKGs/Labs/Other Studies Reviewed:    The following studies were reviewed today: I discussed my findings with the patient at extensive length.   Recent Labs: 12/02/2018: ALT 24; BUN 26; Creatinine, Ser 1.68; Hemoglobin 15.5; Platelets 199; Potassium 5.1; Sodium 137; TSH 0.733  Recent Lipid Panel    Component Value Date/Time   CHOL 195 01/08/2015 0404   TRIG 318 (H) 01/08/2015 0404   HDL 29 (L) 01/08/2015 0404   CHOLHDL 6.7 01/08/2015 0404   VLDL 64 (H) 01/08/2015 0404   LDLCALC 102 (H) 01/08/2015 0404    Physical Exam:    VS:  BP 138/66 (BP Location: Right Arm, Patient Position: Sitting, Cuff Size: Normal)   Pulse 67   Ht 5\' 10"  (1.778 m)   Wt 201 lb (91.2 kg)   SpO2 97%   BMI 28.84 kg/m     Wt Readings from Last 3 Encounters:  12/17/18 201 lb (91.2 kg)  12/04/18 203 lb 6.4 oz (92.3 kg)  12/02/18 200 lb (90.7 kg)     GEN: Patient is in no acute distress HEENT: Normal NECK: No JVD; No carotid bruits LYMPHATICS: No lymphadenopathy CARDIAC: Hear sounds regular, 2/6 systolic murmur at the apex. RESPIRATORY:  Clear to auscultation without rales, wheezing or rhonchi  ABDOMEN: Soft, non-tender, non-distended MUSCULOSKELETAL:  No edema; No deformity  SKIN: Warm and dry NEUROLOGIC:  Alert and oriented x 3 PSYCHIATRIC:  Normal affect   Signed, Jenean Lindau, MD  12/17/2018 9:24 AM    [place]

## 2018-12-17 NOTE — Patient Instructions (Addendum)
Medication Instructions:  Your physician recommends that you continue on your current medications as directed. Please refer to the Current Medication list given to you today.  If you need a refill on your cardiac medications before your next appointment, please call your pharmacy.   Lab work: Your physician recommends that you return for lab work today: BMP.   If you have labs (blood work) drawn today and your tests are completely normal, you will receive your results only by: Marland Kitchen MyChart Message (if you have MyChart) OR . A paper copy in the mail If you have any lab test that is abnormal or we need to change your treatment, we will call you to review the results.  Testing/Procedures: Your physician has recommended that you have a pulmonary function test. Pulmonary Function Tests are a group of tests that measure how well air moves in and out of your lungs. Please report to [facility] at [place], [place] at 3:00 pm on Thursday, December 26, 2018.   Please follow the following instructions before testing:  *Do not smoke 1 hour before testing.  *No caffeine 4 hours before testing.  *No alcohol 4 hours before testing.  *No vigorous exercise 30 minutes before testing.  *No restrictive clothes to chest or abdomen.  *No nebulizers or inhalers 3 hours before testing.    Follow-Up: At [facility], you and your health needs are our priority.  As part of our continuing mission to provide you with exceptional heart care, we have created designated Provider Care Teams.  These Care Teams include your primary Cardiologist (physician) and Advanced Practice Providers (APPs -  Physician Assistants and Nurse Practitioners) who all work together to provide you with the care you need, when you need it. You will need a follow up appointment in 1 month.

## 2018-12-18 ENCOUNTER — Encounter (HOSPITAL_COMMUNITY)
Admission: RE | Admit: 2018-12-18 | Discharge: 2018-12-18 | Disposition: A | Discharge status: Home / Self Care | Payer: Self-pay | Source: Ambulatory Visit | Attending: Cardiology | Admitting: Cardiology

## 2018-12-18 ENCOUNTER — Telehealth: Payer: Self-pay

## 2018-12-18 LAB — BASIC METABOLIC PANEL
BUN/Creatinine Ratio: 19 (ref 10–24)
BUN: 29 mg/dL — ABNORMAL HIGH (ref 8–27)
CO2: 20 mmol/L (ref 20–29)
Calcium: 11.6 mg/dL — ABNORMAL HIGH (ref 8.6–10.2)
Chloride: 97 mmol/L (ref 96–106)
Creatinine, Ser: 1.53 mg/dL — ABNORMAL HIGH (ref 0.76–1.27)
GFR calc Af Amer: 50 mL/min/{1.73_m2} — ABNORMAL LOW (ref >59)
GFR calc non Af Amer: 44 mL/min/{1.73_m2} — ABNORMAL LOW (ref >59)
GLUCOSE: 160 mg/dL — AB (ref 65–99)
Potassium: 5 mmol/L (ref 3.5–5.2)
Sodium: 134 mmol/L (ref 134–144)

## 2018-12-18 NOTE — Telephone Encounter (Signed)
-----   Message from Jenean Lindau, MD sent at 12/18/2018  8:22 AM EST ----- Renal function is stable.  Please encourage him to keep himself well-hydrated.  Will discuss at appointment coming up in the next couple of weeks. Jenean Lindau, MD 12/18/2018 8:21 AM

## 2018-12-18 NOTE — Telephone Encounter (Signed)
Patient called and notified of lab results. 

## 2018-12-20 ENCOUNTER — Encounter (HOSPITAL_COMMUNITY)
Admission: RE | Admit: 2018-12-20 | Discharge: 2018-12-20 | Disposition: A | Discharge status: Home / Self Care | Payer: Self-pay | Source: Ambulatory Visit | Attending: Cardiology | Admitting: Cardiology

## 2018-12-23 ENCOUNTER — Encounter (HOSPITAL_COMMUNITY)
Admission: RE | Admit: 2018-12-23 | Discharge: 2018-12-23 | Disposition: A | Discharge status: Home / Self Care | Payer: Medicare Other | Source: Ambulatory Visit | Attending: Cardiology | Admitting: Cardiology

## 2018-12-25 ENCOUNTER — Encounter (HOSPITAL_COMMUNITY)
Admission: RE | Admit: 2018-12-25 | Discharge: 2018-12-25 | Disposition: A | Discharge status: Home / Self Care | Payer: Self-pay | Source: Ambulatory Visit | Attending: Cardiology | Admitting: Cardiology

## 2018-12-25 ENCOUNTER — Telehealth (HOSPITAL_COMMUNITY): Payer: Self-pay | Admitting: Cardiac Rehabilitation

## 2018-12-25 NOTE — Telephone Encounter (Signed)
-----   Message from Jenean Lindau, MD sent at 12/25/2018 11:45 AM EST ----- Regarding: RE: cardiac rehab Reduce amlo to half dose. I hope you meant 120 sitting!! ----- Message ----- From: Lowell Guitar, RN Sent: 12/25/2018  11:12 AM EST To: Jenean Lindau, MD Subject: cardiac rehab                                  Dear Dr. Geraldo Pitter,  Pt c/o lightheadedness at cardiac rehab today.  He thought is was vertigo,however he describes as "lightheaded" not room spinning.  His usual epley procedures have not helped.   Pt reports symptoms have been present since starting amiodarone, worse today than usual.   BP:  134/78           HR:    61     Lying  BP:  12/78  HR:   59     Sitting  BP:  130/77   HR:   67     Standing    Pt attends our maintenance program so telemetry strip not available.   He is scheduled to taper his amio dose to 100mg  daily on 12/27/2018.   Please advise.  Thank you, Andi Hence, RN, BSN Cardiac Pulmonary Rehab

## 2018-12-25 NOTE — Telephone Encounter (Signed)
pc to pt per Dr. Geraldo Pitter response to pt symptoms of lightheadedness. Pt advised to cut amiodarone dose in 1/2.  Pt will begin with tomorrows dose. Pt verbalized understanding. Andi Hence, RN, BSN Cardiac Pulmonary Rehab

## 2018-12-26 ENCOUNTER — Ambulatory Visit (INDEPENDENT_AMBULATORY_CARE_PROVIDER_SITE_OTHER): Payer: Medicare Other | Admitting: Internal Medicine

## 2018-12-26 DIAGNOSIS — Z79899 Other long term (current) drug therapy: Secondary | ICD-10-CM

## 2018-12-26 LAB — PULMONARY FUNCTION TEST
DL/VA % pred: 75 %
DL/VA: 2.98 ml/min/mmHg/L
DLCO unc % pred: 85 %
DLCO unc: 21.56 ml/min/mmHg
FEF 25-75 Post: 2.75 L/sec
FEF 25-75 Pre: 1.81 L/sec
FEF2575-%Change-Post: 51 %
FEF2575-%Pred-Post: 123 %
FEF2575-%Pred-Pre: 81 %
FEV1-%Change-Post: 8 %
FEV1-%PRED-POST: 102 %
FEV1-%Pred-Pre: 93 %
FEV1-POST: 3.16 L
FEV1-Pre: 2.9 L
FEV1FVC-%CHANGE-POST: 3 %
FEV1FVC-%Pred-Pre: 100 %
FEV6-%CHANGE-POST: 5 %
FEV6-%Pred-Post: 102 %
FEV6-%Pred-Pre: 97 %
FEV6-Post: 4.13 L
FEV6-Pre: 3.93 L
FEV6FVC-%Change-Post: 0 %
FEV6FVC-%Pred-Post: 104 %
FEV6FVC-%Pred-Pre: 104 %
FVC-%CHANGE-POST: 5 %
FVC-%PRED-POST: 98 %
FVC-%Pred-Pre: 93 %
FVC-Post: 4.2 L
FVC-Pre: 3.99 L
Post FEV1/FVC ratio: 75 %
Post FEV6/FVC ratio: 98 %
Pre FEV1/FVC ratio: 73 %
Pre FEV6/FVC Ratio: 98 %
RV % pred: 138 %
RV: 3.61 L
TLC % pred: 123 %
TLC: 8.83 L

## 2018-12-26 NOTE — Progress Notes (Signed)
PFT done today. 

## 2018-12-27 ENCOUNTER — Encounter (HOSPITAL_COMMUNITY): Payer: Self-pay

## 2018-12-30 ENCOUNTER — Encounter (HOSPITAL_COMMUNITY)
Admission: RE | Admit: 2018-12-30 | Discharge: 2018-12-30 | Disposition: A | Discharge status: Home / Self Care | Payer: Self-pay | Source: Ambulatory Visit | Attending: Cardiology | Admitting: Cardiology

## 2018-12-31 ENCOUNTER — Ambulatory Visit: Payer: Medicare Other | Admitting: Cardiology

## 2019-01-01 ENCOUNTER — Encounter (HOSPITAL_COMMUNITY)
Admission: RE | Admit: 2019-01-01 | Discharge: 2019-01-01 | Disposition: A | Discharge status: Home / Self Care | Payer: Self-pay | Source: Ambulatory Visit | Attending: Cardiology | Admitting: Cardiology

## 2019-01-03 ENCOUNTER — Encounter (HOSPITAL_COMMUNITY): Payer: Self-pay

## 2019-01-06 ENCOUNTER — Encounter (HOSPITAL_COMMUNITY)
Admission: RE | Admit: 2019-01-06 | Discharge: 2019-01-06 | Disposition: A | Discharge status: Home / Self Care | Payer: Self-pay | Source: Ambulatory Visit | Attending: Cardiology | Admitting: Cardiology

## 2019-01-08 ENCOUNTER — Encounter (HOSPITAL_COMMUNITY)
Admission: RE | Admit: 2019-01-08 | Discharge: 2019-01-08 | Disposition: A | Discharge status: Home / Self Care | Payer: Self-pay | Source: Ambulatory Visit | Attending: Cardiology | Admitting: Cardiology

## 2019-01-10 ENCOUNTER — Encounter (HOSPITAL_COMMUNITY)
Admission: RE | Admit: 2019-01-10 | Discharge: 2019-01-10 | Disposition: A | Discharge status: Home / Self Care | Payer: Self-pay | Source: Ambulatory Visit | Attending: Cardiology | Admitting: Cardiology

## 2019-01-13 ENCOUNTER — Encounter (HOSPITAL_COMMUNITY)
Admission: RE | Admit: 2019-01-13 | Discharge: 2019-01-13 | Disposition: A | Discharge status: Home / Self Care | Payer: Self-pay | Source: Ambulatory Visit | Attending: Cardiology | Admitting: Cardiology

## 2019-01-13 DIAGNOSIS — I712 Thoracic aortic aneurysm, without rupture: Secondary | ICD-10-CM | POA: Insufficient documentation

## 2019-01-14 ENCOUNTER — Other Ambulatory Visit: Payer: Self-pay | Admitting: Cardiology

## 2019-01-14 ENCOUNTER — Ambulatory Visit (INDEPENDENT_AMBULATORY_CARE_PROVIDER_SITE_OTHER): Payer: Medicare Other | Admitting: Cardiology

## 2019-01-14 ENCOUNTER — Encounter: Payer: Self-pay | Admitting: Cardiology

## 2019-01-14 VITALS — BP 128/76 | HR 56 | Ht 70.0 in | Wt 205.0 lb

## 2019-01-14 DIAGNOSIS — IMO0001 Reserved for inherently not codable concepts without codable children: Secondary | ICD-10-CM

## 2019-01-14 DIAGNOSIS — I714 Abdominal aortic aneurysm, without rupture, unspecified: Secondary | ICD-10-CM

## 2019-01-14 DIAGNOSIS — T82330S Leakage of aortic (bifurcation) graft (replacement), sequela: Secondary | ICD-10-CM

## 2019-01-14 DIAGNOSIS — I712 Thoracic aortic aneurysm, without rupture, unspecified: Secondary | ICD-10-CM

## 2019-01-14 DIAGNOSIS — E782 Mixed hyperlipidemia: Secondary | ICD-10-CM

## 2019-01-14 DIAGNOSIS — I480 Paroxysmal atrial fibrillation: Secondary | ICD-10-CM

## 2019-01-14 DIAGNOSIS — I2510 Atherosclerotic heart disease of native coronary artery without angina pectoris: Secondary | ICD-10-CM

## 2019-01-14 MED ORDER — OMEGA-3-ACID ETHYL ESTERS 1 G PO CAPS: 2.0000 g | ORAL_CAPSULE | Freq: Two times a day (BID) | ORAL | 1 refills | Status: DC

## 2019-01-14 NOTE — Addendum Note (Signed)
Addended by: Ashok Norris on: 01/14/2019 10:17 AM   Modules accepted: Orders

## 2019-01-14 NOTE — Progress Notes (Signed)
Cardiology Office Note:    Date:  01/14/2019   ID:  Garrett Meek Sr., DOB 12/19/1940, MRN [id number]  PCP:  Garrett Contras, MD  Cardiologist:  Garrett Lindau, MD   Referring MD: Garrett Contras, MD    ASSESSMENT:    1. Paroxysmal atrial fibrillation (HCC)   2. Abdominal aortic aneurysm (AAA) without rupture ([place])   3. Coronary artery disease involving native coronary artery of native heart without angina pectoris   4. Endoleak post endovascular aneurysm repair, sequela   5. Mixed hyperlipidemia    PLAN:    In order of problems listed above:  1. Secondary prevention stressed with the patient.  Importance of compliance with diet and medication stressed and he vocalized understanding.  His blood pressure is stable.  Diet was discussed fo dyslipidemia and patient was urged to cut down carbohydrates in diet.  Triglyceride issues were discussed.  I will start him on Lovaza 2 g twice daily.  He will be back in 3 months for a liver lipid check. 2. He has an appointment with our cardiovascular surgical colleagues about his ascending aorta aneurysm and penetrating ulcer. 3. Patient will be seen in follow-up appointment in 3 months or earlier if the patient has any concerns 4. I discussed with him about amiodarone therapy and PFTs which were unremarkable.  His diffusion capacity is normal.I discussed with the patient atrial fibrillation, disease process. Management and therapy including rate and rhythm control, anticoagulation benefits and potential risks were discussed extensively with the patient. Patient had multiple questions which were answered to patient's satisfaction.  Medication Adjustments/Labs and Tests Ordered: Current medicines are reviewed at length with the patient today.  Concerns regarding medicines are outlined above.  No orders of the defined types were placed in this encounter.  No orders of the defined types were placed in this encounter.    No chief complaint on file.    History of Present Illness:    Garrett Jordan. is a 78 y.o. male.  Patient has history approximately fibrillation.  He has coronary artery disease, vascular aneurysm post repair of the abdominal aorta and ascending aorta aneurysm with penetrating ulcer history.  He has history of essential hypertension and dyslipidemia.  He has elevated triglycerides.  He is not very compliant with his diet.  He denies any symptoms at this time.  At the time of my evaluation, the patient is alert awake oriented and in no distress.  Past Medical History:  Diagnosis Date  . AAA (abdominal aortic aneurysm) ([place])   . Arthritis    knees  . Atrial fibrillation ([place])   . CAD (coronary artery disease)   . Cancer [facility])    prostate  . GERD (gastroesophageal reflux disease)   . Gout   . Hyperlipidemia   . Hypertension   . Hypothyroidism   . Kidney stone    right kidney  . Myocardial infarction ([place]) 01/1991   sp inferior  . Nerve compression    right leg  . Reflux   . Seasonal allergies   . Thyroid disease    hypothyroidism    Past Surgical History:  Procedure Laterality Date  . ABDOMINAL AORTAGRAM N/A 02/28/2012   Procedure: ABDOMINAL Maxcine Ham;  Surgeon: Serafina Mitchell, MD;  Location: [facility] CATH LAB;  Service: Cardiovascular;  Laterality: N/A;  . abdominal aortagram embolization  02/28/2012  . ABDOMINAL AORTIC ANEURYSM REPAIR  Sept. 2013  . CORONARY ARTERY BYPASS GRAFT  04/2009  . CORONARY STENT PLACEMENT  1992 and  2009   RCA  . EMBOLIZATION Right 02/28/2012   Procedure: EMBOLIZATION;  Surgeon: Serafina Mitchell, MD;  Location: [facility] CATH LAB;  Service: Cardiovascular;  Laterality: Right;  . IR GENERIC HISTORICAL  12/05/2016   IR RADIOLOGIST EVAL & MGMT 12/05/2016 Jacqulynn Cadet, MD [facility] INTERV RAD  . KIDNEY STONE SURGERY    . LEFT HEART CATHETERIZATION WITH CORONARY/GRAFT ANGIOGRAM N/A 01/08/2015   Procedure: LEFT HEART CATHETERIZATION WITH Beatrix Fetters;  Surgeon: Jacolyn Reedy, MD;   Location: [facility] CATH LAB;  Service: Cardiovascular;  Laterality: N/A;  . parathyroid adenoma    . PROSTATE SURGERY     rad seeds  . SPINE SURGERY    . TONSILLECTOMY      Current Medications: Current Meds  Medication Sig  . acetaminophen (TYLENOL) 650 MG CR tablet Take 650 mg by mouth daily as needed for pain. Reported on 11/29/2015  . allopurinol (ZYLOPRIM) 300 MG tablet Take 300 mg by mouth every other day. Take in the evening - on even days of the month  . amiodarone (PACERONE) 100 MG tablet Take 100 mg by mouth daily.  Marland Kitchen amLODipine (NORVASC) 10 MG tablet Take 10 mg by mouth daily.  Marland Kitchen apixaban (ELIQUIS) 5 MG TABS tablet Take 1 tablet (5 mg total) by mouth 2 (two) times daily.  . Cholecalciferol (VITAMIN D3) 2000 UNITS TABS Take 2,000 Units by mouth daily.   . clonazePAM (KLONOPIN) 1 MG tablet Take 0.5 mg by mouth at bedtime.   . clopidogrel (PLAVIX) 75 MG tablet Take 75 mg by mouth daily.  . Coenzyme Q10 (EQL COQ10) 300 MG CAPS Take by mouth.  . esomeprazole (NEXIUM) 20 MG capsule Take 20 mg by mouth daily at 12 noon.  . fluticasone (FLONASE) 50 MCG/ACT nasal spray Place 2 sprays into both nostrils daily as needed for allergies.   . Glucose Blood (FREESTYLE TEST VI)   . ibuprofen (ADVIL,MOTRIN) 200 MG tablet Take 200 mg by mouth as needed.  Marland Kitchen JANUVIA 50 MG tablet Take 50 mg by mouth daily.  Marland Kitchen levothyroxine (SYNTHROID, LEVOTHROID) 175 MCG tablet Take 175 mcg by mouth daily before breakfast.  . loratadine-pseudoephedrine (CLARITIN-D 24-HOUR) 10-240 MG per 24 hr tablet Take 1 tablet by mouth at bedtime.   . metoprolol succinate (TOPROL-XL) 50 MG 24 hr tablet Take 1 tablet (50 mg total) by mouth daily. Take with or immediately following a meal.  . Multiple Vitamin (MULTIVITAMIN WITH MINERALS) TABS Take 1 tablet by mouth at bedtime.   . Multiple Vitamins-Minerals (PRESERVISION AREDS 2) CAPS Take 1 capsule by mouth 2 (two) times daily.  Marland Kitchen olmesartan-hydrochlorothiazide (BENICAR HCT) 40-25 MG  tablet Take 1 tablet by mouth daily.  . Omega-3 Fatty Acids (FISH OIL) 1200 MG CAPS Take 1,200 mg by mouth 2 (two) times daily.  . rosuvastatin (CRESTOR) 10 MG tablet Take 10 mg by mouth as directed. Per patient, takes Mondays & Thursdays  . spironolactone (ALDACTONE) 25 MG tablet Take 12.5 mg by mouth every evening.   . Tamsulosin HCl (FLOMAX) 0.4 MG CAPS Take 0.4 mg by mouth every other day. Take in the evening - on even days of the month  . triamcinolone cream (KENALOG) 0.1 % Apply 1 application topically as needed.  . vitamin B-12 (CYANOCOBALAMIN) 1000 MCG tablet Take 2,500 mcg by mouth at bedtime.      Allergies:   Ace inhibitors; Crestor [rosuvastatin]; and Nitroglycerin   Social History   Socioeconomic History  . Marital status: Divorced  Spouse name: Not on file  . Number of children: Not on file  . Years of education: Not on file  . Highest education level: Not on file  Occupational History  . Not on file  Social Needs  . Financial resource strain: Not on file  . Food insecurity:    Worry: Not on file    Inability: Not on file  . Transportation needs:    Medical: Not on file    Non-medical: Not on file  Tobacco Use  . Smoking status: Former Smoker    Types: Cigarettes    Last attempt to quit: 08/01/1970    Years since quitting: 48.4  . Smokeless tobacco: Never Used  Substance and Sexual Activity  . Alcohol use: Yes    Alcohol/week: 6.0 - 9.0 standard drinks    Types: 4 - 5 Glasses of wine, 2 - 4 Cans of beer per week    Comment: 1-3 glasses 3-4 times a week  . Drug use: No  . Sexual activity: Not on file  Lifestyle  . Physical activity:    Days per week: Not on file    Minutes per session: Not on file  . Stress: Not on file  Relationships  . Social connections:    Talks on phone: Not on file    Gets together: Not on file    Attends religious service: Not on file    Active member of club or organization: Not on file    Attends meetings of clubs or  organizations: Not on file    Relationship status: Not on file  Other Topics Concern  . Not on file  Social History Narrative  . Not on file     Family History: The patient's family history includes Deep vein thrombosis in his mother; Diabetes in his maternal grandmother, sister, and sister; Heart attack in his father and sister; Heart disease in his father, paternal uncle, and sister; Hyperlipidemia in his father and sister; Hypertension in his father and sister; Stroke in his mother.  ROS:   Please see the history of present illness.    All other systems reviewed and are negative.  EKGs/Labs/Other Studies Reviewed:    The following studies were reviewed today: I discussed my findings with the patient at extensive length.   Recent Labs: 12/02/2018: ALT 24; Hemoglobin 15.5; Platelets 199; TSH 0.733 12/17/2018: BUN 29; Creatinine, Ser 1.53; Potassium 5.0; Sodium 134  Recent Lipid Panel    Component Value Date/Time   CHOL 195 01/08/2015 0404   TRIG 318 (H) 01/08/2015 0404   HDL 29 (L) 01/08/2015 0404   CHOLHDL 6.7 01/08/2015 0404   VLDL 64 (H) 01/08/2015 0404   LDLCALC 102 (H) 01/08/2015 0404    Physical Exam:    VS:  BP 128/76 (BP Location: Right Arm, Patient Position: Sitting, Cuff Size: Normal)   Pulse (!) 56   Ht 5\' 10"  (1.778 m)   Wt 205 lb (93 kg)   SpO2 98%   BMI 29.41 kg/m     Wt Readings from Last 3 Encounters:  01/14/19 205 lb (93 kg)  12/17/18 201 lb (91.2 kg)  12/04/18 203 lb 6.4 oz (92.3 kg)     GEN: Patient is in no acute distress HEENT: Normal NECK: No JVD; No carotid bruits LYMPHATICS: No lymphadenopathy CARDIAC: Hear sounds regular, 2/6 systolic murmur at the apex. RESPIRATORY:  Clear to auscultation without rales, wheezing or rhonchi  ABDOMEN: Soft, non-tender, non-distended MUSCULOSKELETAL:  No edema; No deformity  SKIN:  Warm and dry NEUROLOGIC:  Alert and oriented x 3 PSYCHIATRIC:  Normal affect   Signed, Garrett Lindau, MD  01/14/2019  8:47 AM    [place]

## 2019-01-14 NOTE — Patient Instructions (Signed)
Medication Instructions:  Your physician has recommended you make the following change in your medication:  Start: Lovaza 2g twice daily  If you need a refill on your cardiac medications before your next appointment, please call your pharmacy.   Lab work: Your physician recommends that you return for lab work today: Bmp   Labs before next visit: Bmp, tsh, lft, and lipids   If you have labs (blood work) drawn today and your tests are completely normal, you will receive your results only by: Marland Kitchen MyChart Message (if you have MyChart) OR . A paper copy in the mail If you have any lab test that is abnormal or we need to change your treatment, we will call you to review the results.  Testing/Procedures: None.   Follow-Up: At [facility], you and your health needs are our priority.  As part of our continuing mission to provide you with exceptional heart care, we have created designated Provider Care Teams.  These Care Teams include your primary Cardiologist (physician) and Advanced Practice Providers (APPs -  Physician Assistants and Nurse Practitioners) who all work together to provide you with the care you need, when you need it. You will need a follow up appointment in 3 months.  Please call our office 2 months in advance to schedule this appointment.  You may see No primary care provider on file. or another member of our [employer] in [place]: Jenne Campus, MD . Shirlee More, MD  Any Other Special Instructions Will Be Listed Below (If Applicable).  Fish Oil, Omega-3 Fatty Acids capsules (Rx) What is this medicine? FISH OIL, OMEGA-3 FATTY ACIDS (Fish Oil, oh MAY ga 3 fatty AS ids) are essential fats. It is used to treat high triglyceride levels. This medicine may be used for other purposes; ask your health care provider or pharmacist if you have questions. COMMON BRAND NAME(S): Lovaza, Omacor, Triklo What should I tell my health care provider before I take this  medicine? They need to know if you have any of these conditions -bleeding problems -history of irregular heartbeat -lung or breathing disease, like asthma -an unusual or allergic reaction to fish oil, omega-3 fatty acids, fish, other medicines, foods, dyes, or preservatives -pregnant or trying to get pregnant -breast-feeding How should I use this medicine? Take this medicine by mouth with a glass of water. Follow the directions on the prescription label. Lovaza and Epanova may be taken with or without food. Take Omtryg with food. Take your medicine at regular intervals. Do not take your medicine more often than directed. Talk to your pediatrician regarding the use of this medicine in children. Special care may be needed. Overdosage: If you think you have taken too much of this medicine contact a poison control center or emergency room at once. NOTE: This medicine is only for you. Do not share this medicine with others. What if I miss a dose? If you miss a dose, take it as soon as you can. If it is almost time for your next dose, take only that dose. Do not take double or extra doses. What may interact with this medicine? -aspirin and aspirin-like medicines -herbal products like danshen, dong quai, garlic pills, ginger, ginkgo biloba, horse chestnut, willow bark, and others -medicines that treat or prevent blood clots like enoxaparin, heparin, warfarin This list may not describe all possible interactions. Give your health care provider a list of all the medicines, herbs, non-prescription drugs, or dietary supplements you use. Also tell them  if you smoke, drink alcohol, or use illegal drugs. Some items may interact with your medicine. What should I watch for while using this medicine? Follow a good diet and exercise plan. Taking this medicine does not replace a healthy lifestyle. Some foods that have omega-3 fatty acids naturally are fatty fish like albacore tuna, halibut, herring, mackerel, lake  trout, salmon, and sardines. If you are scheduled for any medical or dental procedure, tell your healthcare provider that you are taking this medicine. You may need to stop taking this medicine before the procedure. What side effects may I notice from receiving this medicine? Side effects that you should report to your doctor or health care professional as soon as possible: -allergic reactions like skin rash, itching or hives, swelling of the face, lips, or tongue -breathing problems -chest pain -fever, infection -unusual bleeding or bruising Side effects that usually do not require medical attention (report to your doctor or health care professional if they continue or are bothersome): -bad or fishy breath -belching -body odor -diarrhea -nausea -stomach gas, upset -weight gain This list may not describe all possible side effects. Call your doctor for medical advice about side effects. You may report side effects to FDA at 1-800-FDA-1088. Where should I keep my medicine? Keep out of the reach of children. Store at room temperature between 15 and 30 degrees C (59 and 86 degrees F). Do not freeze. Throw away any unused medicine after the expiration date. NOTE: This sheet is a summary. It may not cover all possible information. If you have questions about this medicine, talk to your doctor, pharmacist, or health care provider.  2019 Elsevier/Gold Standard (2013-03-19 11:43:34)

## 2019-01-15 ENCOUNTER — Encounter (HOSPITAL_COMMUNITY)
Admission: RE | Admit: 2019-01-15 | Discharge: 2019-01-15 | Disposition: A | Discharge status: Home / Self Care | Payer: Self-pay | Source: Ambulatory Visit | Attending: Cardiology | Admitting: Cardiology

## 2019-01-15 LAB — BASIC METABOLIC PANEL
BUN/Creatinine Ratio: 19 (ref 10–24)
BUN: 33 mg/dL — ABNORMAL HIGH (ref 8–27)
CALCIUM: 9.9 mg/dL (ref 8.6–10.2)
CO2: 20 mmol/L (ref 20–29)
Chloride: 104 mmol/L (ref 96–106)
Creatinine, Ser: 1.75 mg/dL — ABNORMAL HIGH (ref 0.76–1.27)
GFR calc non Af Amer: 37 mL/min/{1.73_m2} — ABNORMAL LOW (ref >59)
GFR, EST AFRICAN AMERICAN: 43 mL/min/{1.73_m2} — AB (ref >59)
Glucose: 167 mg/dL — ABNORMAL HIGH (ref 65–99)
Potassium: 5.1 mmol/L (ref 3.5–5.2)
Sodium: 138 mmol/L (ref 134–144)

## 2019-01-17 ENCOUNTER — Encounter (HOSPITAL_COMMUNITY)
Admission: RE | Admit: 2019-01-17 | Discharge: 2019-01-17 | Disposition: A | Discharge status: Home / Self Care | Payer: Self-pay | Source: Ambulatory Visit | Attending: Cardiology | Admitting: Cardiology

## 2019-01-20 ENCOUNTER — Telehealth: Payer: Self-pay | Admitting: Emergency Medicine

## 2019-01-20 ENCOUNTER — Encounter (HOSPITAL_COMMUNITY)
Admission: RE | Admit: 2019-01-20 | Discharge: 2019-01-20 | Disposition: A | Discharge status: Home / Self Care | Payer: Self-pay | Source: Ambulatory Visit | Attending: Cardiology | Admitting: Cardiology

## 2019-01-20 DIAGNOSIS — N200 Calculus of kidney: Secondary | ICD-10-CM

## 2019-01-20 DIAGNOSIS — I119 Hypertensive heart disease without heart failure: Secondary | ICD-10-CM

## 2019-01-20 NOTE — Telephone Encounter (Signed)
Patient informed of lab results and advised to see a nephrologist. I have sent referral and advised patient to call us back if he doesn't hear from them within 1 week.

## 2019-01-22 ENCOUNTER — Encounter (HOSPITAL_COMMUNITY): Payer: Self-pay

## 2019-01-24 ENCOUNTER — Other Ambulatory Visit: Payer: Self-pay

## 2019-01-24 ENCOUNTER — Encounter (HOSPITAL_COMMUNITY)
Admission: RE | Admit: 2019-01-24 | Discharge: 2019-01-24 | Disposition: A | Discharge status: Home / Self Care | Payer: Self-pay | Source: Ambulatory Visit | Attending: Cardiology | Admitting: Cardiology

## 2019-01-24 ENCOUNTER — Telehealth: Payer: Self-pay

## 2019-01-24 NOTE — Telephone Encounter (Signed)
Patient states he will start watching his diet and has started taking 3600mg  of fish oil daily. RN relayed Dr.RRR advise to take 2g patient made aware. No further questions at this time.

## 2019-01-27 ENCOUNTER — Telehealth (HOSPITAL_COMMUNITY): Payer: Self-pay | Admitting: *Deleted

## 2019-01-27 ENCOUNTER — Encounter (HOSPITAL_COMMUNITY): Payer: Self-pay

## 2019-01-27 NOTE — Telephone Encounter (Signed)
Contacted patient to notify of Cardiac Rehab department closure x2 weeks. Message left on patient's voicemail.   Sol Passer, MS, ACSM CEP Cardiac and Pulmonary Rehabilitaiton

## 2019-01-29 ENCOUNTER — Encounter (HOSPITAL_COMMUNITY): Payer: Self-pay

## 2019-01-29 ENCOUNTER — Ambulatory Visit
Admission: RE | Admit: 2019-01-29 | Discharge: 2019-01-29 | Disposition: A | Discharge status: Home / Self Care | Payer: Medicare Other | Source: Ambulatory Visit | Attending: Cardiothoracic Surgery | Admitting: Cardiothoracic Surgery

## 2019-01-29 ENCOUNTER — Other Ambulatory Visit: Payer: Self-pay

## 2019-01-29 ENCOUNTER — Ambulatory Visit: Payer: Medicare Other | Admitting: Cardiothoracic Surgery

## 2019-01-29 DIAGNOSIS — I712 Thoracic aortic aneurysm, without rupture, unspecified: Secondary | ICD-10-CM

## 2019-01-29 MED ORDER — IOPAMIDOL (ISOVUE-370) INJECTION 76%
50.0000 mL | Freq: Once | INTRAVENOUS | Status: AC | PRN
  Administered 2019-01-29: 50 mL via INTRAVENOUS

## 2019-01-31 ENCOUNTER — Encounter (HOSPITAL_COMMUNITY): Payer: Self-pay

## 2019-02-03 ENCOUNTER — Encounter (HOSPITAL_COMMUNITY): Payer: Self-pay

## 2019-02-03 ENCOUNTER — Telehealth (HOSPITAL_COMMUNITY): Payer: Self-pay | Admitting: Family Medicine

## 2019-02-05 ENCOUNTER — Encounter (HOSPITAL_COMMUNITY): Payer: Self-pay

## 2019-02-07 ENCOUNTER — Encounter (HOSPITAL_COMMUNITY): Payer: Self-pay

## 2019-02-10 ENCOUNTER — Encounter (HOSPITAL_COMMUNITY): Payer: Self-pay

## 2019-02-12 ENCOUNTER — Encounter (HOSPITAL_COMMUNITY): Payer: Self-pay

## 2019-02-14 ENCOUNTER — Encounter (HOSPITAL_COMMUNITY): Payer: Self-pay

## 2019-02-17 ENCOUNTER — Encounter (HOSPITAL_COMMUNITY): Payer: Self-pay

## 2019-02-19 ENCOUNTER — Encounter (HOSPITAL_COMMUNITY): Payer: Self-pay

## 2019-02-20 ENCOUNTER — Telehealth (HOSPITAL_COMMUNITY): Payer: Self-pay | Admitting: Cardiac Rehabilitation

## 2019-02-20 NOTE — Telephone Encounter (Signed)
Pt phone call to inform of continued Outpatient Cardiac Rehab departmental closure for COVID 19 precautions. Future opening date to be determined. Pt instructed to continue exercising on his own following home exercise guidelines.Heis continuing to walk on [place].Pt advised to contact cardiology or PCP PRN symptoms, questions or concerns. Understanding verbalized. Pt denies food insecurity or other needs at this time. Pt offered emotional support and understanding. Pt expressed gratitude for the call and is interested in resuming CRPII when available.  Andi Hence, RN, BSN Cardiac Pulmonary Rehab

## 2019-02-21 ENCOUNTER — Encounter (HOSPITAL_COMMUNITY): Payer: Self-pay

## 2019-02-24 ENCOUNTER — Encounter (HOSPITAL_COMMUNITY): Payer: Self-pay

## 2019-02-26 ENCOUNTER — Encounter (HOSPITAL_COMMUNITY): Payer: Self-pay

## 2019-02-28 ENCOUNTER — Encounter (HOSPITAL_COMMUNITY): Payer: Self-pay

## 2019-03-03 ENCOUNTER — Encounter (HOSPITAL_COMMUNITY): Payer: Self-pay

## 2019-03-05 ENCOUNTER — Encounter (HOSPITAL_COMMUNITY): Payer: Self-pay

## 2019-03-07 ENCOUNTER — Encounter (HOSPITAL_COMMUNITY): Payer: Self-pay

## 2019-03-10 ENCOUNTER — Encounter (HOSPITAL_COMMUNITY): Payer: Self-pay

## 2019-03-12 ENCOUNTER — Ambulatory Visit: Payer: Medicare Other | Admitting: Cardiothoracic Surgery

## 2019-03-12 ENCOUNTER — Encounter (HOSPITAL_COMMUNITY): Payer: Self-pay

## 2019-03-18 ENCOUNTER — Telehealth: Payer: Self-pay | Admitting: Cardiothoracic Surgery

## 2019-03-18 ENCOUNTER — Telehealth: Payer: Medicare Other | Admitting: Cardiothoracic Surgery

## 2019-03-18 ENCOUNTER — Other Ambulatory Visit: Payer: Self-pay

## 2019-03-18 NOTE — Telephone Encounter (Signed)
[address]       [place], [address]             [phone]     CARDIOTHORACIC SURGERY TELEPHONE VIRTUAL OFFICE NOTE  Referring Provider is No ref. provider found Primary Cardiologist is Dr Lennox Pippins PCP is Antony Contras, MD   HPI:  I spoke with Garrett Jordan. (DOB July 14, 1941 ) via telephone on 03/18/2019 at 1:30 PM and verified that I was speaking with the correct person using more than one form of identification.  We discussed the reason(s) for conducting our visit virtually instead of in-person.  The patient expressed understanding the circumstances and agreed to proceed as described.  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  I examined his most recent CTA of the thoracic aorta in March 2020 and discussed the findings with the patient over the phone.  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  There is been no change in the most recent scan. Patient also has atherosclerotic disease with ulcerated calcified plaque in the distal lesser curve of the arch and in the proximal descending thoracic aorta.  These are fairly small approximately 1.5 cm and have been stable.  No new at risk findings for aortic dissection and his risk of dissection remains less than 1-2%.  The patient denies any upper chest or back pain.  He does not smoke.  He is compliant with his blood pressure medications including metoprolol, Benicar, and amlodipine. His blood pressure at his last office visit in March with Dr. Geraldo Pitter was 126/78.  Recently the patient was started on amiodarone and Eliquis for atrial fibrillation. Current Outpatient Medications  Medication Sig Dispense Refill  . acetaminophen (TYLENOL) 650 MG CR tablet Take 650 mg by mouth daily as needed for pain. Reported on 11/29/2015    . allopurinol (ZYLOPRIM) 300 MG tablet Take 300 mg by mouth every  other day. Take in the evening - on even days of the month    . amiodarone (PACERONE) 100 MG tablet Take 100 mg by mouth daily.    Marland Kitchen amLODipine (NORVASC) 10 MG tablet Take 10 mg by mouth daily.    Marland Kitchen apixaban (ELIQUIS) 5 MG TABS tablet Take 1 tablet (5 mg total) by mouth 2 (two) times daily. 180 tablet 1  . Cholecalciferol (VITAMIN D3) 2000 UNITS TABS Take 2,000 Units by mouth daily.     . clonazePAM (KLONOPIN) 1 MG tablet Take 0.5 mg by mouth at bedtime.     . clopidogrel (PLAVIX) 75 MG tablet Take 75 mg by mouth daily.  12  . Coenzyme Q10 (EQL COQ10) 300 MG CAPS Take by mouth.    . esomeprazole (NEXIUM) 20 MG capsule Take 20 mg by mouth daily at 12 noon.    . fluticasone (FLONASE) 50 MCG/ACT nasal spray Place 2 sprays into both nostrils daily as needed for allergies.     . Glucose Blood (FREESTYLE TEST VI)     . ibuprofen (ADVIL,MOTRIN) 200 MG tablet Take 200 mg by mouth as needed.    Marland Kitchen JANUVIA 50 MG tablet Take 50 mg by mouth daily.  1  . levothyroxine (SYNTHROID, LEVOTHROID) 175 MCG tablet Take 175 mcg by mouth daily before breakfast.    . loratadine-pseudoephedrine (CLARITIN-D 24-HOUR) 10-240 MG per 24 hr tablet Take 1 tablet by mouth at bedtime.     . metoprolol succinate (  TOPROL-XL) 50 MG 24 hr tablet Take 1 tablet (50 mg total) by mouth daily. Take with or immediately following a meal. 90 tablet 1  . Multiple Vitamin (MULTIVITAMIN WITH MINERALS) TABS Take 1 tablet by mouth at bedtime.     . Multiple Vitamins-Minerals (PRESERVISION AREDS 2) CAPS Take 1 capsule by mouth 2 (two) times daily.    Marland Kitchen olmesartan-hydrochlorothiazide (BENICAR HCT) 40-25 MG tablet Take 1 tablet by mouth daily. 60 tablet 3  . omega-3 acid ethyl esters (LOVAZA) 1 g capsule Take 2 capsules (2 g total) by mouth 2 (two) times daily. 180 capsule 1  . Omega-3 Fatty Acids (FISH OIL) 1200 MG CAPS Take 1,200 mg by mouth 2 (two) times daily.    . rosuvastatin (CRESTOR) 10 MG tablet Take 10 mg by mouth as directed. Per patient,  takes Mondays & Thursdays    . spironolactone (ALDACTONE) 25 MG tablet Take 12.5 mg by mouth every evening.     . Tamsulosin HCl (FLOMAX) 0.4 MG CAPS Take 0.4 mg by mouth every other day. Take in the evening - on even days of the month    . triamcinolone cream (KENALOG) 0.1 % Apply 1 application topically as needed.  2  . vitamin B-12 (CYANOCOBALAMIN) 1000 MCG tablet Take 2,500 mcg by mouth at bedtime.      No current facility-administered medications for this visit.      Diagnostic Tests:  CTA of thoracic aorta personally reviewed and discussed with patient showing stable mild fusiform dilatation of the proximal aortic root and mild ulcerative plaque disease of the distal arch and proximal descending thoracic aorta.   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    I discussed limitations of evaluation and management via telephone.  The patient was advised to call back for repeat telephone consultation or to seek an in-person evaluation if questions arise or the patient's clinical condition changes in any significant manner.  I spent in excess of 8 minutes of non-face-to-face time during the conduct of this telephone virtual office consultation.  Level 1  (99441)             5-10 minutes Level 2  (99442)            11-20 minutes Level 3  (99443)            21-30 minutes    03/18/2019 1:30 PM

## 2019-03-19 ENCOUNTER — Other Ambulatory Visit: Payer: Self-pay | Admitting: *Deleted

## 2019-03-19 DIAGNOSIS — I2510 Atherosclerotic heart disease of native coronary artery without angina pectoris: Secondary | ICD-10-CM

## 2019-04-11 ENCOUNTER — Telehealth: Payer: Self-pay | Admitting: Cardiology

## 2019-04-11 NOTE — Telephone Encounter (Signed)
Virtual Visit Pre-Appointment Phone Call  "(Name), I am calling you today to discuss your upcoming appointment. We are currently trying to limit exposure to the virus that causes COVID-19 by seeing patients at home rather than in the office."  1. "What is the BEST phone number to call the day of the visit?" - include this in appointment notes  2. Do you have or have access to (through a family member/friend) a smartphone with video capability that we can use for your visit?" a. If yes - list this number in appt notes as cell (if different from BEST phone #) and list the appointment type as a VIDEO visit in appointment notes b. If no - list the appointment type as a PHONE visit in appointment notes  3. Confirm consent - "In the setting of the current Covid19 crisis, you are scheduled for a (phone or video) visit with your provider on (date) at (time).  Just as we do with many in-office visits, in order for you to participate in this visit, we must obtain consent.  If you'd like, I can send this to your mychart (if signed up) or email for you to review.  Otherwise, I can obtain your verbal consent now.  All virtual visits are billed to your insurance company just like a normal visit would be.  By agreeing to a virtual visit, we'd like you to understand that the technology does not allow for your provider to perform an examination, and thus may limit your provider's ability to fully assess your condition. If your provider identifies any concerns that need to be evaluated in person, we will make arrangements to do so.  Finally, though the technology is pretty good, we cannot assure that it will always work on either your or our end, and in the setting of a video visit, we may have to convert it to a phone-only visit.  In either situation, we cannot ensure that we have a secure connection.  Are you willing to proceed?" STAFF: Did the patient verbally acknowledge consent to telehealth visit? Document  YES/NO here: YES  4. Advise patient to be prepared - "Two hours prior to your appointment, go ahead and check your blood pressure, pulse, oxygen saturation, and your weight (if you have the equipment to check those) and write them all down. When your visit starts, your provider will ask you for this information. If you have an Apple Watch or Kardia device, please plan to have heart rate information ready on the day of your appointment. Please have a pen and paper handy nearby the day of the visit as well."  5. Give patient instructions for MyChart download to smartphone OR Doximity/Doxy.me as below if video visit (depending on what platform provider is using)  6. Inform patient they will receive a phone call 15 minutes prior to their appointment time (may be from unknown caller ID) so they should be prepared to answer    TELEPHONE CALL NOTE  Garrett LANTZY Sr. has been deemed a candidate for a follow-up tele-health visit to limit community exposure during the Covid-19 pandemic. I spoke with the patient via phone to ensure availability of phone/video source, confirm preferred email & phone number, and discuss instructions and expectations.  I reminded Garrett BELISLE Sr. to be prepared with any vital sign and/or heart rhythm information that could potentially be obtained via home monitoring, at the time of his visit. I reminded Garrett Meek Sr. to expect a phone  call prior to his visit.  Frederic Jericho 04/11/2019 11:57 AM   INSTRUCTIONS FOR DOWNLOADING THE MYCHART APP TO SMARTPHONE  - The patient must first make sure to have activated MyChart and know their login information - If Apple, go to [employer] and type in MyChart in the search bar and download the app. If Android, ask patient to go to [employer] and type in [facility] in the search bar and download the app. The app is free but as with any other app downloads, their phone may require them to verify saved payment information or  Apple/Android password.  - The patient will need to then log into the app with their MyChart username and password, and select [facility] as their healthcare provider to link the account. When it is time for your visit, go to the MyChart app, find appointments, and click Begin Video Visit. Be sure to Select Allow for your device to access the Microphone and Camera for your visit. You will then be connected, and your provider will be with you shortly.  **If they have any issues connecting, or need assistance please contact MyChart service desk [phone])**  **If using a computer, in order to ensure the best quality for their visit they will need to use either of the following Internet Browsers: [employer], or Google Chrome**  IF USING DOXIMITY or DOXY.ME - The patient will receive a link just prior to their visit by text.     FULL LENGTH CONSENT FOR TELE-HEALTH VISIT   I hereby voluntarily request, consent and authorize [facility] and its employed or contracted physicians, physician assistants, nurse practitioners or other licensed health care professionals (the Practitioner), to provide me with telemedicine health care services (the Services") as deemed necessary by the treating Practitioner. I acknowledge and consent to receive the Services by the Practitioner via telemedicine. I understand that the telemedicine visit will involve communicating with the Practitioner through live audiovisual communication technology and the disclosure of certain medical information by electronic transmission. I acknowledge that I have been given the opportunity to request an in-person assessment or other available alternative prior to the telemedicine visit and am voluntarily participating in the telemedicine visit.  I understand that I have the right to withhold or withdraw my consent to the use of telemedicine in the course of my care at any time, without affecting my right to future care  or treatment, and that the Practitioner or I may terminate the telemedicine visit at any time. I understand that I have the right to inspect all information obtained and/or recorded in the course of the telemedicine visit and may receive copies of available information for a reasonable fee.  I understand that some of the potential risks of receiving the Services via telemedicine include:   Delay or interruption in medical evaluation due to technological equipment failure or disruption;  Information transmitted may not be sufficient (e.g. poor resolution of images) to allow for appropriate medical decision making by the Practitioner; and/or   In rare instances, security protocols could fail, causing a breach of personal health information.  Furthermore, I acknowledge that it is my responsibility to provide information about my medical history, conditions and care that is complete and accurate to the best of my ability. I acknowledge that Practitioner's advice, recommendations, and/or decision may be based on factors not within their control, such as incomplete or inaccurate data provided by me or distortions of diagnostic images or specimens that may result from  electronic transmissions. I understand that the practice of medicine is not an exact science and that Practitioner makes no warranties or guarantees regarding treatment outcomes. I acknowledge that I will receive a copy of this consent concurrently upon execution via email to the email address I last provided but may also request a printed copy by calling the office of [facility].    I understand that my insurance will be billed for this visit.   I have read or had this consent read to me.  I understand the contents of this consent, which adequately explains the benefits and risks of the Services being provided via telemedicine.   I have been provided ample opportunity to ask questions regarding this consent and the Services and have had  my questions answered to my satisfaction.  I give my informed consent for the services to be provided through the use of telemedicine in my medical care  By participating in this telemedicine visit I agree to the above.

## 2019-04-15 ENCOUNTER — Encounter: Payer: Self-pay | Admitting: Cardiology

## 2019-04-15 ENCOUNTER — Telehealth (INDEPENDENT_AMBULATORY_CARE_PROVIDER_SITE_OTHER): Payer: Medicare Other | Admitting: Cardiology

## 2019-04-15 ENCOUNTER — Other Ambulatory Visit: Payer: Self-pay

## 2019-04-15 VITALS — BP 104/65 | HR 60 | Ht 70.0 in | Wt 201.0 lb

## 2019-04-15 DIAGNOSIS — IMO0001 Reserved for inherently not codable concepts without codable children: Secondary | ICD-10-CM

## 2019-04-15 DIAGNOSIS — Z951 Presence of aortocoronary bypass graft: Secondary | ICD-10-CM

## 2019-04-15 DIAGNOSIS — I2510 Atherosclerotic heart disease of native coronary artery without angina pectoris: Secondary | ICD-10-CM

## 2019-04-15 DIAGNOSIS — T82330D Leakage of aortic (bifurcation) graft (replacement), subsequent encounter: Secondary | ICD-10-CM

## 2019-04-15 DIAGNOSIS — Z7689 Persons encountering health services in other specified circumstances: Secondary | ICD-10-CM

## 2019-04-15 DIAGNOSIS — E782 Mixed hyperlipidemia: Secondary | ICD-10-CM

## 2019-04-15 DIAGNOSIS — I480 Paroxysmal atrial fibrillation: Secondary | ICD-10-CM

## 2019-04-15 NOTE — Progress Notes (Signed)
Virtual Visit via Video Note   This visit type was conducted due to national recommendations for restrictions regarding the COVID-19 Pandemic (e.g. social distancing) in an effort to limit this patient's exposure and mitigate transmission in our community.  Due to his co-morbid illnesses, this patient is at least at moderate risk for complications without adequate follow up.  This format is felt to be most appropriate for this patient at this time.  All issues noted in this document were discussed and addressed.  A limited physical exam was performed with this format.  Please refer to the patient's chart for his consent to telehealth for [facility].   Date:  04/15/2019   ID:  Garrett Meek Sr., DOB 1941-07-02, MRN [id number]  Patient Location: Home Provider Location: Home  PCP:  Antony Contras, MD  Cardiologist:  No primary care provider on file.  Electrophysiologist:  None   Evaluation Performed:  Follow-Up Visit  Chief Complaint: Coronary artery disease  History of Present Illness:    Garrett HANLEY Sr. is a 78 y.o. male with past medical history of essential hypertension, dyslipidemia, approximately fibrillation and coronary artery disease.  He denies any problems at this time.  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  He denies any problems at this time and takes care of activities of daily living.  No chest pain orthopnea or PND.  Patient mentions to me that he would like to see a cardiologist where he lives which is essentially near [facility].  He used to see Dr. Wynonia Lawman in the past and tells me that coming to [facility] is a try for him that he prefers not to do it.  The patient does not have symptoms concerning for COVID-19 infection (fever, chills, cough, or new shortness of breath).    Past Medical History:  Diagnosis Date  . AAA (abdominal aortic aneurysm) ([place])   . Arthritis    knees  . Atrial fibrillation ([place])   . CAD (coronary artery  disease)   . Cancer [facility])    prostate  . GERD (gastroesophageal reflux disease)   . Gout   . Hyperlipidemia   . Hypertension   . Hypothyroidism   . Kidney stone    right kidney  . Myocardial infarction ([place]) 01/1991   sp inferior  . Nerve compression    right leg  . Reflux   . Seasonal allergies   . Thyroid disease    hypothyroidism   Past Surgical History:  Procedure Laterality Date  . ABDOMINAL AORTAGRAM N/A 02/28/2012   Procedure: ABDOMINAL Maxcine Ham;  Surgeon: Serafina Mitchell, MD;  Location: [facility] CATH LAB;  Service: Cardiovascular;  Laterality: N/A;  . abdominal aortagram embolization  02/28/2012  . ABDOMINAL AORTIC ANEURYSM REPAIR  Sept. 2013  . CORONARY ARTERY BYPASS GRAFT  04/2009  . [place] and  2009   RCA  . EMBOLIZATION Right 02/28/2012   Procedure: EMBOLIZATION;  Surgeon: Serafina Mitchell, MD;  Location: [facility] CATH LAB;  Service: Cardiovascular;  Laterality: Right;  . IR GENERIC HISTORICAL  12/05/2016   IR RADIOLOGIST EVAL & MGMT 12/05/2016 Jacqulynn Cadet, MD [facility] INTERV RAD  . KIDNEY STONE SURGERY    . LEFT HEART CATHETERIZATION WITH CORONARY/GRAFT ANGIOGRAM N/A 01/08/2015   Procedure: LEFT HEART CATHETERIZATION WITH Beatrix Fetters;  Surgeon: Jacolyn Reedy, MD;  Location: [facility] CATH LAB;  Service: Cardiovascular;  Laterality: N/A;  . parathyroid adenoma    . PROSTATE SURGERY  rad seeds  . SPINE SURGERY    . TONSILLECTOMY       Current Meds  Medication Sig  . allopurinol (ZYLOPRIM) 300 MG tablet Take 300 mg by mouth every other day. Take in the evening - on even days of the month  . amiodarone (PACERONE) 200 MG tablet Take 100 mg by mouth daily.   Marland Kitchen amLODipine (NORVASC) 10 MG tablet Take 10 mg by mouth daily.  Marland Kitchen apixaban (ELIQUIS) 5 MG TABS tablet Take 1 tablet (5 mg total) by mouth 2 (two) times daily.  . Cholecalciferol (VITAMIN D3) 2000 UNITS TABS Take 2,000 Units by mouth daily.   . clonazePAM (KLONOPIN) 1 MG tablet Take 0.5 mg  by mouth at bedtime.   . clopidogrel (PLAVIX) 75 MG tablet Take 75 mg by mouth daily.  . Coenzyme Q10 (EQL COQ10) 300 MG CAPS Take by mouth.  . esomeprazole (NEXIUM) 20 MG capsule Take 20 mg by mouth daily at 12 noon.  . fluticasone (FLONASE) 50 MCG/ACT nasal spray Place 2 sprays into both nostrils daily as needed for allergies.   . Glucose Blood (FREESTYLE TEST VI)   . ibuprofen (ADVIL,MOTRIN) 200 MG tablet Take 200 mg by mouth as needed.  Marland Kitchen JANUVIA 50 MG tablet Take 50 mg by mouth daily.  Marland Kitchen levothyroxine (SYNTHROID, LEVOTHROID) 175 MCG tablet Take 175 mcg by mouth daily before breakfast.  . loratadine-pseudoephedrine (CLARITIN-D 24-HOUR) 10-240 MG per 24 hr tablet Take 1 tablet by mouth at bedtime.   . metoprolol succinate (TOPROL-XL) 50 MG 24 hr tablet Take 1 tablet (50 mg total) by mouth daily. Take with or immediately following a meal.  . Multiple Vitamin (MULTIVITAMIN WITH MINERALS) TABS Take 1 tablet by mouth at bedtime.   . Multiple Vitamins-Minerals (PRESERVISION AREDS 2) CAPS Take 1 capsule by mouth 2 (two) times daily.  Marland Kitchen olmesartan-hydrochlorothiazide (BENICAR HCT) 40-25 MG tablet Take 1 tablet by mouth daily.  Marland Kitchen omega-3 acid ethyl esters (LOVAZA) 1 g capsule Take 2 capsules (2 g total) by mouth 2 (two) times daily.  . Omega-3 Fatty Acids (FISH OIL) 1200 MG CAPS Take 1,200 mg by mouth 2 (two) times daily.  . rosuvastatin (CRESTOR) 10 MG tablet Take 10 mg by mouth as directed. Per patient, takes Mondays & Thursdays  . spironolactone (ALDACTONE) 25 MG tablet Take 12.5 mg by mouth every evening.   . Tamsulosin HCl (FLOMAX) 0.4 MG CAPS Take 0.4 mg by mouth every other day. Take in the evening - on even days of the month  . triamcinolone cream (KENALOG) 0.1 % Apply 1 application topically as needed.  . vitamin B-12 (CYANOCOBALAMIN) 1000 MCG tablet Take 2,500 mcg by mouth at bedtime.      Allergies:   Ace inhibitors; Crestor [rosuvastatin]; and Nitroglycerin   Social History    Tobacco Use  . Smoking status: Former Smoker    Types: Cigarettes    Last attempt to quit: 08/01/1970    Years since quitting: 48.7  . Smokeless tobacco: Never Used  Substance Use Topics  . Alcohol use: Yes    Alcohol/week: 6.0 - 9.0 standard drinks    Types: 4 - 5 Glasses of wine, 2 - 4 Cans of beer per week    Comment: 1-3 glasses 3-4 times a week  . Drug use: No     Family Hx: The patient's family history includes Deep vein thrombosis in his mother; Diabetes in his maternal grandmother, sister, and sister; Heart attack in his father and sister; Heart disease  in his father, paternal uncle, and sister; Hyperlipidemia in his father and sister; Hypertension in his father and sister; Stroke in his mother.  ROS:   Please see the history of present illness.    As mentioned above All other systems reviewed and are negative.   Prior CV studies:   The following studies were reviewed today:  CT scan report was discussed with the patient at length  Labs/Other Tests and Data Reviewed:    EKG:  No ECG reviewed.  Recent Labs: 12/02/2018: ALT 24; Hemoglobin 15.5; Platelets 199; TSH 0.733 01/14/2019: BUN 33; Creatinine, Ser 1.75; Potassium 5.1; Sodium 138   Recent Lipid Panel Lab Results  Component Value Date/Time   CHOL 195 01/08/2015 04:04 AM   TRIG 318 (H) 01/08/2015 04:04 AM   HDL 29 (L) 01/08/2015 04:04 AM   CHOLHDL 6.7 01/08/2015 04:04 AM   LDLCALC 102 (H) 01/08/2015 04:04 AM    Wt Readings from Last 3 Encounters:  04/15/19 201 lb (91.2 kg)  01/14/19 205 lb (93 kg)  12/17/18 201 lb (91.2 kg)     Objective:    Vital Signs:  BP 104/65 (BP Location: Left Arm, Patient Position: Sitting, Cuff Size: Normal)   Pulse 60   Ht 5\' 10"  (1.778 m)   Wt 201 lb (91.2 kg)   BMI 28.84 kg/m    VITAL SIGNS:  reviewed  ASSESSMENT & PLAN:    1. Coronary artery disease: Secondary prevention stressed with the patient.  Importance of compliance with diet and medication stressed and he  vocalized understanding. 2. Essential hypertension: Blood pressure stable 3. Penetrating aortic ulcer: I discussed findings with the patient at length including CT scan report. 4. Mixed dyslipidemia: Patient is on appropriate medications and lipids will be followed by his primary care physician. 5. I have requested my nurse to give him an appointment with 1 of her [place] office for the [facility].  Patient does not have a personal preference and we will find a doctor who did him for a routine follow-up appointment in our group 4 months from now.  In the interim if has any questions he knows to call us.  COVID-19 Education: The signs and symptoms of COVID-19 were discussed with the patient and how to seek care for testing (follow up with PCP or arrange E-visit).  The importance of social distancing was discussed today.  Time:   Today, I have spent 15 minutes with the patient with telehealth technology discussing the above problems.     Medication Adjustments/Labs and Tests Ordered: Current medicines are reviewed at length with the patient today.  Concerns regarding medicines are outlined above.   Tests Ordered: No orders of the defined types were placed in this encounter.   Medication Changes: No orders of the defined types were placed in this encounter.   Disposition:  Follow up in prn 43mo   Signed, Jenean Lindau, MD  04/15/2019 3:01 PM    Garrett Jordan

## 2019-04-16 NOTE — Patient Instructions (Signed)
Medication Instructions:  Your physician recommends that you continue on your current medications as directed. Please refer to the Current Medication list given to you today.  If you need a refill on your cardiac medications before your next appointment, please call your pharmacy.   Lab work: NONE If you have labs (blood work) drawn today and your tests are completely normal, you will receive your results only by: Marland Kitchen MyChart Message (if you have MyChart) OR . A paper copy in the mail If you have any lab test that is abnormal or we need to change your treatment, we will call you to review the results.  Testing/Procedures: NONE  Follow-Up: At [facility], you and your health needs are our priority.  As part of our continuing mission to provide you with exceptional heart care, we have created designated Provider Care Teams.  These Care Teams include your primary Cardiologist (physician) and Advanced Practice Providers (APPs -  Physician Assistants and Nurse Practitioners) who all work together to provide you with the care you need, when you need it. You will need a follow up appointment in 4 months.  Please call our office 2 months in advance to schedule this appointment.  You may see No primary care provider on file. or one of the following Advanced Practice Providers on your designated Care Team:   Lyda Jester, PA-C Melina Copa, PA-C . Ermalinda Barrios, PA-C  PER YOUR REQUEST a transfer referral to our [facility] cardiology office was submitted on 04/16/19 on your behalf.

## 2019-04-16 NOTE — Addendum Note (Signed)
Addended by: Beckey Rutter on: 04/16/2019 09:33 AM   Modules accepted: Orders

## 2019-04-28 ENCOUNTER — Other Ambulatory Visit (HOSPITAL_COMMUNITY): Payer: Medicare Other

## 2019-05-07 ENCOUNTER — Ambulatory Visit: Payer: Medicare Other | Admitting: Cardiothoracic Surgery

## 2019-05-13 ENCOUNTER — Telehealth (HOSPITAL_COMMUNITY): Payer: Self-pay | Admitting: *Deleted

## 2019-05-13 NOTE — Telephone Encounter (Signed)
Left message re: the status of the maintenance Cardiac Rehab exercise classes.  They remain on hold at this time due to COVID-19 precautions.

## 2019-06-11 ENCOUNTER — Other Ambulatory Visit: Payer: Self-pay | Admitting: Surgery

## 2019-06-11 DIAGNOSIS — T82330A Leakage of aortic (bifurcation) graft (replacement), initial encounter: Secondary | ICD-10-CM

## 2019-06-11 DIAGNOSIS — I714 Abdominal aortic aneurysm, without rupture, unspecified: Secondary | ICD-10-CM

## 2019-06-11 DIAGNOSIS — IMO0001 Reserved for inherently not codable concepts without codable children: Secondary | ICD-10-CM

## 2019-06-16 ENCOUNTER — Other Ambulatory Visit: Payer: Self-pay | Admitting: Cardiology

## 2019-06-17 ENCOUNTER — Telehealth: Payer: Self-pay | Admitting: Cardiology

## 2019-06-17 MED ORDER — CLOPIDOGREL BISULFATE 75 MG PO TABS: 75.0000 mg | ORAL_TABLET | Freq: Every day | ORAL | 1 refills | Status: DC

## 2019-06-17 NOTE — Telephone Encounter (Signed)
New Message   Patient calling in to get his appointment scheduled via referral. Patient was initially established with Dr. Geraldo Pitter, but would like to transfer care to Dr. Marlou Porch in the [facility]. Patient lives in [place] and would rather travel to the [employer] office vs the [employer] office. Appointment has been scheduled via referral with Dr. Marlou Porch on 07/28/19 at 1:20pm. I wanted to make sure that was ok with you both. Let me know.   Thanks

## 2019-06-17 NOTE — Telephone Encounter (Signed)
ok 

## 2019-06-26 ENCOUNTER — Other Ambulatory Visit: Payer: Self-pay | Admitting: Interventional Radiology

## 2019-06-26 DIAGNOSIS — IMO0001 Reserved for inherently not codable concepts without codable children: Secondary | ICD-10-CM

## 2019-06-26 DIAGNOSIS — I714 Abdominal aortic aneurysm, without rupture, unspecified: Secondary | ICD-10-CM

## 2019-06-26 DIAGNOSIS — T82330A Leakage of aortic (bifurcation) graft (replacement), initial encounter: Secondary | ICD-10-CM

## 2019-07-10 ENCOUNTER — Other Ambulatory Visit: Payer: Self-pay

## 2019-07-10 ENCOUNTER — Ambulatory Visit
Admission: RE | Admit: 2019-07-10 | Discharge: 2019-07-10 | Disposition: A | Discharge status: Home / Self Care | Payer: Medicare Other | Source: Ambulatory Visit | Attending: Surgery | Admitting: Surgery

## 2019-07-10 ENCOUNTER — Ambulatory Visit
Admission: RE | Admit: 2019-07-10 | Discharge: 2019-07-10 | Disposition: A | Discharge status: Home / Self Care | Payer: Medicare Other | Source: Ambulatory Visit | Attending: Interventional Radiology | Admitting: Interventional Radiology

## 2019-07-10 ENCOUNTER — Telehealth: Payer: Self-pay | Admitting: Cardiology

## 2019-07-10 ENCOUNTER — Encounter: Payer: Self-pay | Admitting: *Deleted

## 2019-07-10 DIAGNOSIS — IMO0001 Reserved for inherently not codable concepts without codable children: Secondary | ICD-10-CM

## 2019-07-10 DIAGNOSIS — I714 Abdominal aortic aneurysm, without rupture, unspecified: Secondary | ICD-10-CM

## 2019-07-10 DIAGNOSIS — T82330A Leakage of aortic (bifurcation) graft (replacement), initial encounter: Secondary | ICD-10-CM

## 2019-07-10 HISTORY — PX: IR RADIOLOGIST EVAL & MGMT: IMG5224

## 2019-07-10 MED ORDER — IOPAMIDOL (ISOVUE-370) INJECTION 76%
50.0000 mL | Freq: Once | INTRAVENOUS | Status: AC | PRN
  Administered 2019-07-10: 50 mL via INTRAVENOUS

## 2019-07-10 MED ORDER — SPIRONOLACTONE 25 MG PO TABS: 12.5000 mg | ORAL_TABLET | Freq: Every evening | ORAL | 4 refills | Status: DC

## 2019-07-10 NOTE — Telephone Encounter (Signed)
°*  STAT* If patient is at the pharmacy, call can be transferred to refill team.   1. Which medications need to be refilled? (please list name of each medication and dose if known) Spironolactone 25mg   2. Which pharmacy/location (including street and city if local pharmacy) is medication to be sent to? [facility]  3. Do they need a 30 day or 90 day supply? [place]

## 2019-07-10 NOTE — Progress Notes (Addendum)
Chief Complaint: Patient was seen in consultation today for AAA with type 2 endoleak at the request of Yenesis Even  Referring Physician(s): Harold Barban  History of Present Illness: Garrett Jordan. is a 78 y.o. male with a history of abdominal aortic aneurysm status post endovascular aortic repair with bifurcated stent graft in September 2013. 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  He underwent endovascular endoleak repair with coil embolization of the flow channel in the aneurysm sac in the origin of the inferior mesenteric artery on 07/16/2015. He presents today for follow-up.  Garrett Jordan is doing clinically well.  He has had some pain in the lateral aspect of his right hip joint.  He saw a chiropractor for this issue recently and was told it might be trochanteric bursitis.  They performed some cold and electrical based therapy which temporarily made his pain worse.  This is chronic and nagging but not particularly severe.  We reviewed his CT scan and took a close look at his hip.  He has no evidence of osteoarthritis or bony lesion.  There may be some asymmetric soft tissue thickening overlying the greater trochanter which could very well represent trochanteric bursitis.    He is essentially asymptomatic. He occasionally gets twinges of abdominal pain although these are very nonspecific. He is currently battling a hemorrhoid which is his main active complaint. He denies significant changes in bowel or bladder habits. No chest pain, shortness of breath or other systemic symptoms.  Past Medical History:  Diagnosis Date  . AAA (abdominal aortic aneurysm) ([place])   . Arthritis    knees  . Atrial fibrillation ([place])   . CAD (coronary artery disease)   . Cancer [facility])    prostate  . GERD (gastroesophageal reflux disease)   . Gout   . Hyperlipidemia   .  Hypertension   . Hypothyroidism   . Kidney stone    right kidney  . Myocardial infarction ([place]) 01/1991   sp inferior  . Nerve compression    right leg  . Reflux   . Seasonal allergies   . Thyroid disease    hypothyroidism    Past Surgical History:  Procedure Laterality Date  . ABDOMINAL AORTAGRAM N/A 02/28/2012   Procedure: ABDOMINAL Maxcine Ham;  Surgeon: Serafina Mitchell, MD;  Location: [facility] CATH LAB;  Service: Cardiovascular;  Laterality: N/A;  . abdominal aortagram embolization  02/28/2012  . ABDOMINAL AORTIC ANEURYSM REPAIR  Sept. 2013  . CORONARY ARTERY BYPASS GRAFT  04/2009  . [place] and  2009   RCA  . EMBOLIZATION Right 02/28/2012   Procedure: EMBOLIZATION;  Surgeon: Serafina Mitchell, MD;  Location: [facility] CATH LAB;  Service: Cardiovascular;  Laterality: Right;  . IR GENERIC HISTORICAL  12/05/2016   IR RADIOLOGIST EVAL & MGMT 12/05/2016 Jacqulynn Cadet, MD [facility] INTERV RAD  . KIDNEY STONE SURGERY    . LEFT HEART CATHETERIZATION WITH CORONARY/GRAFT ANGIOGRAM N/A 01/08/2015   Procedure: LEFT HEART CATHETERIZATION WITH Beatrix Fetters;  Surgeon: Jacolyn Reedy, MD;  Location: [facility] CATH LAB;  Service: Cardiovascular;  Laterality: N/A;  . parathyroid adenoma    . PROSTATE SURGERY     rad seeds  . SPINE SURGERY    . TONSILLECTOMY      Allergies: Ace inhibitors, Crestor [rosuvastatin], and Nitroglycerin  Medications: Prior to Admission medications   Medication Sig  Start Date End Date Taking? Authorizing Provider  allopurinol (ZYLOPRIM) 300 MG tablet Take 300 mg by mouth every other day. Take in the evening - on even days of the month    [provider]  amiodarone (PACERONE) 200 MG tablet Take 100 mg by mouth daily.     [provider]  amLODipine (NORVASC) 10 MG tablet Take 10 mg by mouth daily.    [provider]  apixaban (ELIQUIS) 5 MG TABS tablet Take 1 tablet (5 mg total) by mouth 2 (two) times daily. 12/03/18    Revankar, Reita Cliche, MD  Cholecalciferol (VITAMIN D3) 2000 UNITS TABS Take 2,000 Units by mouth daily.     [provider]  clonazePAM (KLONOPIN) 1 MG tablet Take 0.5 mg by mouth at bedtime.     [provider]  clopidogrel (PLAVIX) 75 MG tablet Take 1 tablet (75 mg total) by mouth daily. 06/17/19   Revankar, Reita Cliche, MD  Coenzyme Q10 (EQL COQ10) 300 MG CAPS Take by mouth.    [provider]  esomeprazole (NEXIUM) 20 MG capsule Take 20 mg by mouth daily at 12 noon.    [provider]  fluticasone (FLONASE) 50 MCG/ACT nasal spray Place 2 sprays into both nostrils daily as needed for allergies.     [provider]  Glucose Blood (FREESTYLE TEST VI)  04/03/13   [provider]  ibuprofen (ADVIL,MOTRIN) 200 MG tablet Take 200 mg by mouth as needed.    [provider]  JANUVIA 50 MG tablet Take 50 mg by mouth daily. 08/12/18   [provider]  levothyroxine (SYNTHROID, LEVOTHROID) 175 MCG tablet Take 175 mcg by mouth daily before breakfast.    [provider]  loratadine-pseudoephedrine (CLARITIN-D 24-HOUR) 10-240 MG per 24 hr tablet Take 1 tablet by mouth at bedtime.     [provider]  metoprolol succinate (TOPROL-XL) 50 MG 24 hr tablet Take 1 tablet (50 mg total) by mouth daily. Take with or immediately following a meal. 12/04/18   Revankar, Reita Cliche, MD  Multiple Vitamin (MULTIVITAMIN WITH MINERALS) TABS Take 1 tablet by mouth at bedtime.     [provider]  Multiple Vitamins-Minerals (PRESERVISION AREDS 2) CAPS Take 1 capsule by mouth 2 (two) times daily.    [provider]  olmesartan-hydrochlorothiazide (BENICAR HCT) 40-25 MG tablet Take 1 tablet by mouth daily. 10/15/18   Park Liter, MD  omega-3 acid ethyl esters (LOVAZA) 1 g capsule Take 2 capsules (2 g total) by mouth 2 (two) times daily. 01/14/19   Revankar, Reita Cliche, MD  Omega-3 Fatty Acids (FISH OIL) 1200 MG CAPS Take 1,200 mg by mouth  2 (two) times daily.    [provider]  rosuvastatin (CRESTOR) 10 MG tablet Take 10 mg by mouth as directed. Per patient, takes Mondays & Thursdays    [provider]  spironolactone (ALDACTONE) 25 MG tablet Take 0.5 tablets (12.5 mg total) by mouth every evening. 07/10/19   Revankar, Reita Cliche, MD  Tamsulosin HCl (FLOMAX) 0.4 MG CAPS Take 0.4 mg by mouth every other day. Take in the evening - on even days of the month    [provider]  triamcinolone cream (KENALOG) 0.1 % Apply 1 application topically as needed. 02/07/18   [provider]  vitamin B-12 (CYANOCOBALAMIN) 1000 MCG tablet Take 2,500 mcg by mouth at bedtime.     [provider]     Family History  Problem Relation Age of Onset  .  Heart disease Father        Heart Disease before age 74  . Heart attack Father   . Hyperlipidemia Father   . Hypertension Father   . Stroke Mother   . Deep vein thrombosis Mother   . Heart disease Sister   . Diabetes Sister   . Hyperlipidemia Sister   . Hypertension Sister   . Heart attack Sister   . Heart disease Paternal Uncle   . Diabetes Maternal Grandmother   . Diabetes Sister     Social History   Socioeconomic History  . Marital status: Divorced    Spouse name: Not on file  . Number of children: Not on file  . Years of education: Not on file  . Highest education level: Not on file  Occupational History  . Not on file  Social Needs  . Financial resource strain: Not on file  . Food insecurity    Worry: Not on file    Inability: Not on file  . Transportation needs    Medical: Not on file    Non-medical: Not on file  Tobacco Use  . Smoking status: Former Smoker    Types: Cigarettes    Quit date: 08/01/1970    Years since quitting: 48.9  . Smokeless tobacco: Never Used  Substance and Sexual Activity  . Alcohol use: Yes    Alcohol/week: 6.0 - 9.0 standard drinks    Types: 4 - 5 Glasses of wine, 2 - 4 Cans of beer per week     Comment: 1-3 glasses 3-4 times a week  . Drug use: No  . Sexual activity: Not on file  Lifestyle  . Physical activity    Days per week: Not on file    Minutes per session: Not on file  . Stress: Not on file  Relationships  . Social Herbalist on phone: Not on file    Gets together: Not on file    Attends religious service: Not on file    Active member of club or organization: Not on file    Attends meetings of clubs or organizations: Not on file    Relationship status: Not on file  Other Topics Concern  . Not on file  Social History Narrative  . Not on file    Review of Systems: A 12 point ROS discussed and pertinent positives are indicated in the HPI above.  All other systems are negative.  Review of Systems  Vital Signs: BP 132/81 (BP Location: Right Arm)   Pulse (!) 59   Temp 98.7 F (37.1 C)   SpO2 98%   Physical Exam Vitals signs reviewed.  Constitutional:      Appearance: Normal appearance.  HENT:     Head: Normocephalic and atraumatic.  Eyes:     General: No scleral icterus. Cardiovascular:     Rate and Rhythm: Normal rate.  Pulmonary:     Effort: Pulmonary effort is normal.  Abdominal:     General: Abdomen is flat. There is no distension.     Tenderness: There is no abdominal tenderness.  Neurological:     Mental Status: He is alert and oriented to person, place, and time.  Psychiatric:        Mood and Affect: Mood normal.        Behavior: Behavior normal.     Imaging: Ct Angio Abd/pel W/ And/or W/o  Result Date: 07/10/2019 CLINICAL DATA:  AAA endograft repair, previous endovascular repair type 2 endoleak  EXAM: CT ANGIOGRAPHY ABDOMEN AND PELVIS WITH CONTRAST AND WITHOUT CONTRAST TECHNIQUE: Multidetector CT imaging of the abdomen and pelvis was performed using the standard protocol during bolus administration of intravenous contrast. Multiplanar reconstructed images and MIPs were obtained and reviewed to evaluate the vascular anatomy.  CONTRAST:  48mL ISOVUE-370 IOPAMIDOL (ISOVUE-370) INJECTION 76% COMPARISON:  11/29/2017 FINDINGS: VASCULAR Aorta: Patient is status post remote endograft repair of the infrarenal abdominal aortic aneurysm. Patient has also undergone endovascular embolization for previous type 2 endoleak from the IMA. Stable native aneurysm sac diameter measuring 6.2 x 4.5 cm, previously 6.1 x 4.8 cm. Postcontrast, there is a tiny type 2 endoleak along the posterior native aneurysm sac wall, image 110 series 9. This is also present on delayed images, image 31 series 19. This is likely secondary to small lumbar arteries at this level, L3. Celiac: Atherosclerotic changes but remains widely patent including its branches SMA: Minor atherosclerosis but remains patent including its branches Renals: Atherosclerotic origins but both renal arteries remain patent. No accessory renal artery. IMA: Previous coil embolization of the IMA origin extending into the native aneurysm sac. IMA reconstituted via SMA collateral pathways supplying the distal branches. Inflow: Endograft extends into the left common iliac artery and into the right external iliac artery. Stented segments remain patent. No inflow disease or occlusion. Previous coil embolization of the right internal iliac artery. Left internal iliac origin is tortuous with similar minor aneurysmal dilatation measuring 1.7 cm. External iliac arteries remain patent. Proximal Outflow: Visualized common femoral, proximal profunda femoral and proximal SFA bilaterally are all patent. Veins: No veno-occlusive process on the delayed imaging. Review of the MIP images confirms the above findings. NON-VASCULAR Lower chest: Minor basilar atelectasis. Stable cardiomegaly. No pericardial or pleural effusion. No acute lower chest process. Hepatobiliary: Slight improvement in the hepatic steatosis. There are scattered small transient hepatic attenuation defects/perfusion abnormalities in the right hepatic lobe  related to arterial phase imaging. No intrahepatic biliary dilatation. Gallstones noted. Gallbladder nondistended. Common bile duct nondilated. Pancreas: Unremarkable. No pancreatic ductal dilatation or surrounding inflammatory changes. Spleen: Normal in size without focal abnormality. Splenic granulomata evident. Adrenals/Urinary Tract: Normal adrenal glands. No renal obstruction or hydronephrosis. Small scattered subcentimeter cortical cysts bilaterally. Nonobstructing subcentimeter intrarenal calculi bilaterally. No hydroureter, ureteral calculus, or bladder abnormality. Stomach/Bowel: Negative for bowel obstruction, significant dilatation, ileus, or free air. Normal appendix demonstrated. Negative for ascites, free fluid, fluid collection, abscess, hemorrhage or hematoma. Lymphatic: No bulky adenopathy. Reproductive: Radiation prostate seeds noted. Prostate normal in size. Seminal vesicles unremarkable. Other: Small fat containing inguinal hernias. Small fat containing umbilical hernia. Musculoskeletal: Degenerative changes of the spine. No acute osseous finding or compression fracture. IMPRESSION: VASCULAR Very small posterior type 2 endoleak appearing secondary to the adjacent L3 lumbar arteries. Stable native aneurysm sac diameter measuring 6.2 x 4.5 cm Otherwise stable CTA of the abdomen and pelvis without other acute vascular process NON-VASCULAR Interval improvement in the hepatic steatosis Nonobstructing nephrolithiasis Fat containing bilateral inguinal hernias and umbilical hernia as before Electronically Signed   By: Jerilynn Mages.  Shick M.D.   On: 07/10/2019 11:43    Labs:  CBC: Recent Labs    12/02/18 1538  WBC 8.3  HGB 15.5  HCT 44.5  PLT 199    COAGS: No results for input(s): INR, APTT in the last 8760 hours.  BMP: Recent Labs    12/02/18 1538 12/17/18 1004 01/14/19 0902  NA 137 134 138  K 5.1 5.0 5.1  CL 100 97 104  CO2 22 20  20  GLUCOSE 140* 160* 167*  BUN 26 29* 33*  CALCIUM  10.5* 11.6* 9.9  CREATININE 1.68* 1.53* 1.75*  GFRNONAA 39* 44* 37*  GFRAA 45* 50* 43*    LIVER FUNCTION TESTS: Recent Labs    12/02/18 1538  BILITOT 0.4  AST 24  ALT 24  ALKPHOS 107  PROT 7.3  ALBUMIN 4.5    TUMOR MARKERS: No results for input(s): AFPTM, CEA, CA199, CHROMGRNA in the last 8760 hours.  Assessment and Plan:  Doing very well clinically.  There is evidence of a recurrent but very small type II endoleak in the posterior aspect of the excluded aneurysm sac.  This does not appear to be exerting any significant endo-tension.  The excluded aneurysm sac remains stable in size.  No intervention indicated at this point.  We will continue observation.  1.)  Repeat CTA of the abdomen and pelvis with clinic visit in 1 year. 2.)  Rest ice and over-the-counter anti-inflammatories for his right trochanteric bursitis.  If his pain persists, he may follow-up with an orthopedic surgeon for steroid injection.   Electronically Signed: Jacqulynn Cadet 07/10/2019, 12:55 PM   I spent a total of 15 Minutes in face to face in clinical consultation, greater than 50% of which was counseling/coordinating care for AAA with type 2 endoleak.

## 2019-07-17 ENCOUNTER — Telehealth (INDEPENDENT_AMBULATORY_CARE_PROVIDER_SITE_OTHER): Payer: Medicare Other | Admitting: Cardiology

## 2019-07-17 ENCOUNTER — Other Ambulatory Visit: Payer: Self-pay

## 2019-07-17 ENCOUNTER — Telehealth: Payer: Self-pay | Admitting: Radiology

## 2019-07-17 VITALS — BP 105/67 | HR 64 | Ht 69.0 in | Wt 203.0 lb

## 2019-07-17 DIAGNOSIS — E782 Mixed hyperlipidemia: Secondary | ICD-10-CM

## 2019-07-17 DIAGNOSIS — I714 Abdominal aortic aneurysm, without rupture, unspecified: Secondary | ICD-10-CM

## 2019-07-17 DIAGNOSIS — I2510 Atherosclerotic heart disease of native coronary artery without angina pectoris: Secondary | ICD-10-CM | POA: Diagnosis not present

## 2019-07-17 DIAGNOSIS — I480 Paroxysmal atrial fibrillation: Secondary | ICD-10-CM | POA: Diagnosis not present

## 2019-07-17 DIAGNOSIS — I712 Thoracic aortic aneurysm, without rupture, unspecified: Secondary | ICD-10-CM

## 2019-07-17 DIAGNOSIS — Z951 Presence of aortocoronary bypass graft: Secondary | ICD-10-CM | POA: Diagnosis not present

## 2019-07-17 DIAGNOSIS — I10 Essential (primary) hypertension: Secondary | ICD-10-CM

## 2019-07-17 DIAGNOSIS — T82330D Leakage of aortic (bifurcation) graft (replacement), subsequent encounter: Secondary | ICD-10-CM

## 2019-07-17 DIAGNOSIS — E119 Type 2 diabetes mellitus without complications: Secondary | ICD-10-CM

## 2019-07-17 DIAGNOSIS — IMO0001 Reserved for inherently not codable concepts without codable children: Secondary | ICD-10-CM

## 2019-07-17 NOTE — Patient Instructions (Signed)
Medication Instructions:  The current medical regimen is effective;  continue present plan and medications.  If you need a refill on your cardiac medications before your next appointment, please call your pharmacy.   Testing/Procedures: Your physician has recommended that you wear an event monitor. Event monitors are medical devices that record the heart's electrical activity. Doctors most often [name] these monitors to diagnose arrhythmias. Arrhythmias are problems with the speed or rhythm of the heartbeat. The monitor is a small, portable device. You can wear one while you do your normal daily activities. This is usually used to diagnose what is causing palpitations/syncope (passing out).  You will be contacted regarding the scheduling/instructions for this.  Follow-Up: Follow up in 6 months with Dr. Marlou Porch.  You will receive a letter in the mail 2 months before you are due.  Please call us when you receive this letter to schedule your follow up appointment.  Thank you for choosing [facility]!!

## 2019-07-17 NOTE — Telephone Encounter (Signed)
Enrolled patient for a 14 day Zio monitor to be mailed. Brief instructions were gone over with the patient and he knows to expect the monitor to arrive in 3-4 days.

## 2019-07-17 NOTE — Progress Notes (Signed)
Virtual Visit via Video Note   This visit type was conducted due to national recommendations for restrictions regarding the COVID-19 Pandemic (e.g. social distancing) in an effort to limit this patient's exposure and mitigate transmission in our community.  Due to his co-morbid illnesses, this patient is at least at moderate risk for complications without adequate follow up.  This format is felt to be most appropriate for this patient at this time.  All issues noted in this document were discussed and addressed.  A limited physical exam was performed with this format.  Please refer to the patient's chart for his consent to telehealth for [facility].   Date:  07/17/2019   ID:  [name], DOB Oct 03, 1941, MRN [id number]  Patient Location: Home Provider Location: Office  PCP:  Antony Contras, MD  Cardiologist:  Candee Furbish, MD  Electrophysiologist:  None   Evaluation Performed:  New Patient Evaluation  Chief Complaint:  CAD, CABG, Aorta establish care. Former Dr. Wynonia Lawman patient.   History of Present Illness:    Nello Corro. is a 78 y.o. 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 Darcey Nora, with ulcerated calcified plaque of the proximal descending aorta. Stable, 1.5cm.   Non smoker. BP controlled. CTA 01/2019  Has seen Dr. Geraldo Pitter but wishes to be seen closer to home in [place].   Has been feeling well.  Easy bruising.  Had lengthy conversation with him.  Overall been doing very well.  We reviewed several EKGs from 12/02/2018.  Interestingly, he was discovered to be in atrial fibrillation during the echocardiogram.  He was subsequently placed on amiodarone.   The patient does not have symptoms concerning for COVID-19 infection (fever, chills, cough, or new shortness of breath).    Past Medical History:  Diagnosis Date  . AAA (abdominal aortic aneurysm) ([place])   . Arthritis    knees  . Atrial fibrillation ([place])   . CAD  (coronary artery disease)   . Cancer [facility])    prostate  . GERD (gastroesophageal reflux disease)   . Gout   . Hyperlipidemia   . Hypertension   . Hypothyroidism   . Kidney stone    right kidney  . Myocardial infarction ([place]) 01/1991   sp inferior  . Nerve compression    right leg  . Reflux   . Seasonal allergies   . Thyroid disease    hypothyroidism   Past Surgical History:  Procedure Laterality Date  . ABDOMINAL AORTAGRAM N/A 02/28/2012   Procedure: ABDOMINAL Maxcine Ham;  Surgeon: Serafina Mitchell, MD;  Location: [facility] CATH LAB;  Service: Cardiovascular;  Laterality: N/A;  . abdominal aortagram embolization  02/28/2012  . ABDOMINAL AORTIC ANEURYSM REPAIR  Sept. 2013  . CORONARY ARTERY BYPASS GRAFT  04/2009  . [place] and  2009   RCA  . EMBOLIZATION Right 02/28/2012   Procedure: EMBOLIZATION;  Surgeon: Serafina Mitchell, MD;  Location: [facility] CATH LAB;  Service: Cardiovascular;  Laterality: Right;  . IR GENERIC HISTORICAL  12/05/2016   IR RADIOLOGIST EVAL & MGMT 12/05/2016 Jacqulynn Cadet, MD [facility] INTERV RAD  . IR RADIOLOGIST EVAL & MGMT  07/10/2019  . KIDNEY STONE SURGERY    . LEFT HEART CATHETERIZATION WITH CORONARY/GRAFT ANGIOGRAM N/A 01/08/2015   Procedure: LEFT HEART CATHETERIZATION WITH Beatrix Fetters;  Surgeon: Jacolyn Reedy, MD;  Location: [facility] CATH LAB;  Service: Cardiovascular;  Laterality: N/A;  . parathyroid adenoma    . PROSTATE  SURGERY     rad seeds  . SPINE SURGERY    . TONSILLECTOMY       Current Meds  Medication Sig  . allopurinol (ZYLOPRIM) 300 MG tablet Take 300 mg by mouth every other day. Take in the evening - on even days of the month  . amiodarone (PACERONE) 200 MG tablet Take 100 mg by mouth daily.   Marland Kitchen amLODipine (NORVASC) 10 MG tablet Take 10 mg by mouth daily.  Marland Kitchen apixaban (ELIQUIS) 5 MG TABS tablet Take 1 tablet (5 mg total) by mouth 2 (two) times daily.  . Cholecalciferol (VITAMIN D3) 2000 UNITS TABS Take 2,000 Units by  mouth daily.   . clonazePAM (KLONOPIN) 1 MG tablet Take 0.5 mg by mouth at bedtime.   . clopidogrel (PLAVIX) 75 MG tablet Take 1 tablet (75 mg total) by mouth daily.  . Coenzyme Q10 (EQL COQ10) 300 MG CAPS Take by mouth.  . esomeprazole (NEXIUM) 20 MG capsule Take 20 mg by mouth daily at 12 noon.  . fluticasone (FLONASE) 50 MCG/ACT nasal spray Place 2 sprays into both nostrils daily as needed for allergies.   . Glucose Blood (FREESTYLE TEST VI)   . ibuprofen (ADVIL,MOTRIN) 200 MG tablet Take 200 mg by mouth as needed.  Marland Kitchen JANUVIA 50 MG tablet Take 50 mg by mouth daily.  Marland Kitchen levothyroxine (SYNTHROID, LEVOTHROID) 175 MCG tablet Take 175 mcg by mouth daily before breakfast.  . loratadine-pseudoephedrine (CLARITIN-D 24-HOUR) 10-240 MG per 24 hr tablet Take 1 tablet by mouth at bedtime.   . metoprolol succinate (TOPROL-XL) 50 MG 24 hr tablet Take 1 tablet (50 mg total) by mouth daily. Take with or immediately following a meal.  . Multiple Vitamin (MULTIVITAMIN WITH MINERALS) TABS Take 1 tablet by mouth at bedtime.   . Multiple Vitamins-Minerals (PRESERVISION AREDS 2) CAPS Take 1 capsule by mouth 2 (two) times daily.  Marland Kitchen olmesartan-hydrochlorothiazide (BENICAR HCT) 40-25 MG tablet Take 1 tablet by mouth daily.  . Omega-3 Fatty Acids (FISH OIL) 1200 MG CAPS Take 1,200 mg by mouth 2 (two) times daily.  . rosuvastatin (CRESTOR) 10 MG tablet Take 10 mg by mouth as directed. Per patient, takes Mondays & Thursdays  . spironolactone (ALDACTONE) 25 MG tablet Take 0.5 tablets (12.5 mg total) by mouth every evening.  . Tamsulosin HCl (FLOMAX) 0.4 MG CAPS Take 0.4 mg by mouth every other day. Take in the evening - on even days of the month  . triamcinolone cream (KENALOG) 0.1 % Apply 1 application topically as needed.  . vitamin B-12 (CYANOCOBALAMIN) 1000 MCG tablet Take 2,500 mcg by mouth at bedtime.      Allergies:   Ace inhibitors, Crestor [rosuvastatin], and Nitroglycerin   Social History   Tobacco Use  .  Smoking status: Former Smoker    Types: Cigarettes    Quit date: 08/01/1970    Years since quitting: 48.9  . Smokeless tobacco: Never Used  Substance Use Topics  . Alcohol use: Yes    Alcohol/week: 6.0 - 9.0 standard drinks    Types: 4 - 5 Glasses of wine, 2 - 4 Cans of beer per week    Comment: 1-3 glasses 3-4 times a week  . Drug use: No     Family Hx: The patient's family history includes Deep vein thrombosis in his mother; Diabetes in his maternal grandmother, sister, and sister; Heart attack in his father and sister; Heart disease in his father, paternal uncle, and sister; Hyperlipidemia in his father and sister; Hypertension  in his father and sister; Stroke in his mother.  ROS:   Please see the history of present illness.    No fever chills nausea vomiting syncope bleeding All other systems reviewed and are negative.   Prior CV studies:   The following studies were reviewed today:  ECHO 10/16/18 - Left ventricle: The cavity size was normal. Wall thickness was   increased in a pattern of moderate LVH. Systolic function was   normal. Wall motion was normal; there were no regional wall   motion abnormalities. - Mitral valve: There was mild regurgitation. - Right ventricle: The cavity size was moderately dilated. Wall   thickness was normal. - RV appears enlarged, but accurate size estimation could not be   done because of poor visualization.  Impressions:  - 1. Left ventricular systolic function is preserved visually   estimated ejection fraction is 50 to 55%. Mild to moderate   concentric left ventricular hypertrophy.   2. Mildly dilated left atrium.   3. Mildly dilated ascending aorta measured at 4.4 cm.   Labs/Other Tests and Data Reviewed:    EKG:  Several EKGs reviewed, right bundle branch block, sinus bradycardia first-degree AV block, 12/02/2018 EKGs to appear to be atrial fibrillation.  I do see a couple P waves interspersed however.  Recent Labs:  12/02/2018: ALT 24; Hemoglobin 15.5; Platelets 199; TSH 0.733 01/14/2019: BUN 33; Creatinine, Ser 1.75; Potassium 5.1; Sodium 138   Recent Lipid Panel Lab Results  Component Value Date/Time   CHOL 195 01/08/2015 04:04 AM   TRIG 318 (H) 01/08/2015 04:04 AM   HDL 29 (L) 01/08/2015 04:04 AM   CHOLHDL 6.7 01/08/2015 04:04 AM   LDLCALC 102 (H) 01/08/2015 04:04 AM    Wt Readings from Last 3 Encounters:  07/17/19 203 lb (92.1 kg)  04/15/19 201 lb (91.2 kg)  01/14/19 205 lb (93 kg)     Objective:    Vital Signs:  BP 105/67   Pulse 64   Ht 5\' 9"  (1.753 m)   Wt 203 lb (92.1 kg)   BMI 29.98 kg/m    VITAL SIGNS:  reviewed GEN:  no acute distress EYES:  sclerae anicteric, EOMI - Extraocular Movements Intact RESPIRATORY:  normal respiratory effort, symmetric expansion SKIN:  no rash, lesions or ulcers. MUSCULOSKELETAL:  no obvious deformities. NEURO:  alert and oriented x 3, no obvious focal deficit PSYCH:  normal affect  ASSESSMENT & PLAN:    CAD post CABG  - 2010, stable, no angina  - first MI in 1992 - Dr. Wynonia Lawman  Dilated ascending aorta with descending aorta plaque  - stable 4.2 on 01/2019  - Dr. Darcey Nora  - BP control. 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   - 2016 coil repair done. Stable  - Followed by Dr. Trula Slade and interventional radiology  GERD  - PPI-chest pain went away  PAF  - Eliquis, no bleeding  - amiodarone 100 QD (labs, PFT normal). Noted from ECHO 2019.   - On PLAVIX as well (increased bleeding risk)  -We will go ahead and place the ZIO patch monitor.  If by chance there is no evidence of any atrial fibrillation, I may give him an opportunity to be off of the amiodarone and see how he does.  Hyperlipidemia  On Crestor 10 Mon and Thurs, could not tolerate higher dosing.  DM with HTN  - Januvia (Dr. Moreen Fowler)  -  ARB/HCT combo     COVID-19 Education: The  signs and symptoms of COVID-19 were discussed with the patient and how to seek care for testing (follow up with PCP or arrange E-visit).  The importance of social distancing was discussed today.  Time:   Today, I have spent 22 minutes with the patient with telehealth technology discussing the above problems.     Medication Adjustments/Labs and Tests Ordered: Current medicines are reviewed at length with the patient today.  Concerns regarding medicines are outlined above.   Tests Ordered: Orders Placed This Encounter  Procedures  . LONG TERM MONITOR (3-14 DAYS)    Medication Changes: No orders of the defined types were placed in this encounter.   Follow Up:  Virtual Visit or In Person in 6 month(s)  Signed, Candee Furbish, MD  07/17/2019 9:57 AM    [place]

## 2019-07-23 ENCOUNTER — Ambulatory Visit (INDEPENDENT_AMBULATORY_CARE_PROVIDER_SITE_OTHER): Payer: Medicare Other

## 2019-07-23 DIAGNOSIS — I480 Paroxysmal atrial fibrillation: Secondary | ICD-10-CM

## 2019-07-25 ENCOUNTER — Telehealth (HOSPITAL_COMMUNITY): Payer: Self-pay | Admitting: Rehabilitation

## 2019-07-25 NOTE — Telephone Encounter (Signed)

## 2019-07-28 ENCOUNTER — Telehealth: Payer: Medicare Other | Admitting: Cardiology

## 2019-07-28 ENCOUNTER — Other Ambulatory Visit: Payer: Self-pay

## 2019-07-28 ENCOUNTER — Encounter: Payer: Self-pay | Admitting: Surgery

## 2019-07-28 ENCOUNTER — Ambulatory Visit: Payer: Medicare Other | Admitting: Surgery

## 2019-07-28 VITALS — BP 123/77 | HR 63 | Temp 97.7°F | Resp 20 | Ht 69.0 in | Wt 204.5 lb

## 2019-07-28 DIAGNOSIS — I714 Abdominal aortic aneurysm, without rupture, unspecified: Secondary | ICD-10-CM

## 2019-07-28 NOTE — Progress Notes (Signed)
[facility] [place]  Patient name: Garrett Jordan. MRN: [id number] DOB: April 14, 1941 Sex: male   REASON FOR VISIT:    Follow up  HISOTRY OF PRESENT ILLNESS:   The patient is back today for followup of his abdominal aortic aneurysm. He initially underwent coil embolization of his right hypogastric artery. He developed severe buttock claudication therefore his aneurysm repair was delayed. His symptoms of claudication gradually improved and on 08/08/2012 he underwent endovascular repair of his aneurysm. 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  He is wearing a heart monitor.  He reports some shortness of breath with activity.  He is scheduled to see Dr. Marlou Porch in 2 weeks.  The patient suffers from diabetes which is a relatively new diagnosis.  He remains very active.  He is a smoker.  His blood pressure is medically managed.  He takes a statin for hypercholesterolemia.  He is on Eliquis for atrial fibrillation PAST MEDICAL HISTORY:   Past Medical History:  Diagnosis Date  . AAA (abdominal aortic aneurysm) ([place])   . Arthritis    knees  . Atrial fibrillation ([place])   . CAD (coronary artery disease)   . Cancer [facility])    prostate  . GERD (gastroesophageal reflux disease)   . Gout   . Hyperlipidemia   . Hypertension   . Hypothyroidism   . Kidney stone    right kidney  . Myocardial infarction ([place]) 01/1991   sp inferior  . Nerve compression    right leg  . Reflux   . Seasonal allergies   . Thyroid disease    hypothyroidism     FAMILY HISTORY:   Family History  Problem Relation Age of Onset  . Heart disease Father        Heart Disease before age 66  . Heart attack Father   . Hyperlipidemia Father   . Hypertension Father   . Stroke Mother   . Deep vein thrombosis Mother   . Heart disease  Sister   . Diabetes Sister   . Hyperlipidemia Sister   . Hypertension Sister   . Heart attack Sister   . Heart disease Paternal Uncle   . Diabetes Maternal Grandmother   . Diabetes Sister     SOCIAL HISTORY:   Social History   Tobacco Use  . Smoking status: Former Smoker    Types: Cigarettes    Quit date: 08/01/1970    Years since quitting: 49.0  . Smokeless tobacco: Never Used  Substance Use Topics  . Alcohol use: Yes    Alcohol/week: 6.0 - 9.0 standard drinks    Types: 4 - 5 Glasses of wine, 2 - 4 Cans of beer per week    Comment: 1-3 glasses 3-4 times a week     ALLERGIES:   Allergies  Allergen Reactions  . Ace Inhibitors Cough  . Crestor [Rosuvastatin] Other (See Comments)    Muscle aches  . Nitroglycerin Other (See Comments)    Very pronounced lowered BP with IV nitro for caths     CURRENT MEDICATIONS:   Current Outpatient Medications  Medication Sig Dispense Refill  . allopurinol (ZYLOPRIM) 300 MG tablet Take 300 mg by mouth every other day. Take in the evening - on even days of the month    . amiodarone (PACERONE) 200 MG tablet Take 100 mg by mouth daily.     Marland Kitchen  amLODipine (NORVASC) 10 MG tablet Take 10 mg by mouth daily.    Marland Kitchen apixaban (ELIQUIS) 5 MG TABS tablet Take 1 tablet (5 mg total) by mouth 2 (two) times daily. 180 tablet 1  . Cholecalciferol (VITAMIN D3) 2000 UNITS TABS Take 2,000 Units by mouth daily.     . clonazePAM (KLONOPIN) 1 MG tablet Take 0.5 mg by mouth at bedtime.     . clopidogrel (PLAVIX) 75 MG tablet Take 1 tablet (75 mg total) by mouth daily. 90 tablet 1  . Coenzyme Q10 (EQL COQ10) 300 MG CAPS Take by mouth.    . esomeprazole (NEXIUM) 20 MG capsule Take 20 mg by mouth daily at 12 noon.    . fluticasone (FLONASE) 50 MCG/ACT nasal spray Place 2 sprays into both nostrils daily as needed for allergies.     . Glucose Blood (FREESTYLE TEST VI)     . ibuprofen (ADVIL,MOTRIN) 200 MG tablet Take 200 mg by mouth as needed.    Marland Kitchen JANUVIA 50 MG  tablet Take 50 mg by mouth daily.  1  . levothyroxine (SYNTHROID, LEVOTHROID) 175 MCG tablet Take 175 mcg by mouth daily before breakfast.    . loratadine-pseudoephedrine (CLARITIN-D 24-HOUR) 10-240 MG per 24 hr tablet Take 1 tablet by mouth at bedtime.     . metoprolol succinate (TOPROL-XL) 50 MG 24 hr tablet Take 1 tablet (50 mg total) by mouth daily. Take with or immediately following a meal. 90 tablet 1  . Multiple Vitamin (MULTIVITAMIN WITH MINERALS) TABS Take 1 tablet by mouth at bedtime.     . Multiple Vitamins-Minerals (PRESERVISION AREDS 2) CAPS Take 1 capsule by mouth 2 (two) times daily.    Marland Kitchen olmesartan-hydrochlorothiazide (BENICAR HCT) 40-25 MG tablet Take 1 tablet by mouth daily. 60 tablet 3  . omega-3 acid ethyl esters (LOVAZA) 1 g capsule Take 2 capsules (2 g total) by mouth 2 (two) times daily. (Patient not taking: Reported on 07/17/2019) 180 capsule 1  . Omega-3 Fatty Acids (FISH OIL) 1200 MG CAPS Take 1,200 mg by mouth 2 (two) times daily.    . rosuvastatin (CRESTOR) 10 MG tablet Take 10 mg by mouth as directed. Per patient, takes Mondays & Thursdays    . spironolactone (ALDACTONE) 25 MG tablet Take 0.5 tablets (12.5 mg total) by mouth every evening. 30 tablet 4  . Tamsulosin HCl (FLOMAX) 0.4 MG CAPS Take 0.4 mg by mouth every other day. Take in the evening - on even days of the month    . triamcinolone cream (KENALOG) 0.1 % Apply 1 application topically as needed.  2  . vitamin B-12 (CYANOCOBALAMIN) 1000 MCG tablet Take 2,500 mcg by mouth at bedtime.      No current facility-administered medications for this visit.     REVIEW OF SYSTEMS:   [X]  denotes positive finding, [ ]  denotes negative finding Cardiac  Comments:  Chest pain or chest pressure:    Shortness of breath upon exertion: x   Short of breath when lying flat:    Irregular heart rhythm:        Vascular    Pain in calf, thigh, or hip brought on by ambulation:    Pain in feet at night that wakes you up from your  sleep:     Blood clot in your veins:    Leg swelling:         Pulmonary    Oxygen at home:    Productive cough:     Wheezing:  Neurologic    Sudden weakness in arms or legs:     Sudden numbness in arms or legs:     Sudden onset of difficulty speaking or slurred speech:    Temporary loss of vision in one eye:     Problems with dizziness:         Gastrointestinal    Blood in stool:     Vomited blood:         Genitourinary    Burning when urinating:     Blood in urine:        Psychiatric    Major depression:         Hematologic    Bleeding problems:    Problems with blood clotting too easily:        Skin    Rashes or ulcers:        Constitutional    Fever or chills:      PHYSICAL EXAM:   There were no vitals filed for this visit.  GENERAL: The patient is a well-nourished male, in no acute distress. The vital signs are documented above. CARDIAC: There is a regular rate and rhythm.  PULMONARY: Non-labored respirations MUSCULOSKELETAL: There are no major deformities or cyanosis. NEUROLOGIC: No focal weakness or paresthesias are detected. SKIN: There are no ulcers or rashes noted. PSYCHIATRIC: The patient has a normal affect.  STUDIES:   I have reviewed his CT scan with the following findings: Very small posterior type 2 endoleak appearing secondary to the adjacent L3 lumbar arteries. Stable native aneurysm sac diameter measuring 6.2 x 4.5 cm  Otherwise stable CTA of the abdomen and pelvis without other acute vascular process  NON-VASCULAR  Interval improvement in the hepatic steatosis  Nonobstructing nephrolithiasis  Fat containing bilateral inguinal hernias and umbilical hernia as before  MEDICAL ISSUES:    AAA:  Stable aneurysm with maximal aortic diameter of 6.2 cm and small type 2 leak.  I will repeat his scan in 1 year   Leia Alf, MD, FACS [facility] [phone] Pager [phone]

## 2019-08-04 ENCOUNTER — Other Ambulatory Visit: Payer: Self-pay

## 2019-08-04 MED ORDER — AMIODARONE HCL 200 MG PO TABS: 100.0000 mg | ORAL_TABLET | Freq: Every day | ORAL | 1 refills | Status: DC

## 2019-08-19 ENCOUNTER — Other Ambulatory Visit: Payer: Self-pay

## 2019-08-21 ENCOUNTER — Telehealth: Payer: Self-pay | Admitting: *Deleted

## 2019-08-21 NOTE — Telephone Encounter (Signed)
LM to CB on pt's voicemail to discuss information below:  Given his transient periods of bradycardia, 3-second pause, no evidence of atrial fibrillation, I would like to discontinue his amiodarone 100 mg a day for fears of worsening conduction and potential pacemaker need. Since there is no evidence of atrial fibrillation, I will discontinue his Eliquis. Continue with Plavix. Of course, if atrial fibrillation were to present itself, anticoagulation once again would be indicated.  Candee Furbish, MD

## 2019-08-21 NOTE — Telephone Encounter (Signed)
Spoke with pt and reviewed results and orders with him.  He is very pleased to hear he can discontinue the Amiodarone and Eliquis.  I advised him to put those 2 medications in the back of his medicine cabinet for now so that he will have them one hand if needed.  Hopefully that will not be the case.  Pt states understanding and will c/b if any questions or concerns.  Medication list updated.

## 2019-09-03 ENCOUNTER — Telehealth: Payer: Self-pay | Admitting: *Deleted

## 2019-09-03 NOTE — Telephone Encounter (Signed)
Dr. Marlou Porch  Can you please address this patient's Plavix therapy? He is scheduled for a colonoscopy 09/26/2019 in which the procedural team is asking for Plavix holding recommendations.  You last saw him in 71 in which he was doing well from a cardiac perspective.  He has a prior history of CABG in 2010, a 4.2cm stable, dilated ascending aorta followed by Dr. Darcey Nora and recent diagnosis of atrial fibrillation in which a monitor was worn that showed no recurrence and therefore he was taken off Eliquis and amiodarone therapies.  Still with a history of hyperlipidemia and DM 2 with hypertension.   Please send your recommendations to the preoperative pool  Thank you Sharee Pimple

## 2019-09-03 NOTE — Telephone Encounter (Signed)
    [facility] Pre-operative Risk Assessment    Request for surgical clearance:  1. What type of surgery is being performed? COLONOSCOPY   2. When is this surgery scheduled? 09/26/19   3. What type of clearance is required (medical clearance vs. Pharmacy clearance to hold med vs. Both)? MEDICAL  4. Are there any medications that need to be held prior to surgery and how long? PLAVIX   5. Practice name and name of physician performing surgery? [facility]; DR. Michail Sermon   6. What is your office phone number [phone]    7.   What is your office fax number [phone]  8.   Anesthesia type (None, local, MAC, general) ?  NOT LISTED, PROPOFOL ?    Julaine Hua 09/03/2019, 2:44 PM  _________________________________________________________________   (provider comments below)

## 2019-09-04 NOTE — Telephone Encounter (Signed)
He may hold Plavix for colonoscopy from cardiac perspective. I will forward to Dr. Trula Slade to make sure he is ok with this as well. Note he is off of Eliquis.   Candee Furbish, MD

## 2019-09-16 NOTE — Telephone Encounter (Signed)
Please let the requesting office know that we are waiting on Dr. Stephens Shire recommendations.   Cleared from a cardiac perspective with Dr. Marlou Porch.   Thank you   Kathyrn Drown NP-C [facility] Pager: [phone]

## 2019-09-16 NOTE — Telephone Encounter (Signed)
Follow Up  Melissa from [facility] (Dr. Michail Sermon) is calling in to get clarification on if it is ok to hold plavix for colonoscopy. Please give Melissa a call back to clarify.

## 2019-09-18 NOTE — Telephone Encounter (Signed)
   Primary Cardiologist: Candee Furbish, MD  Chart reviewed as part of pre-operative protocol coverage. Given past medical history and time since last visit, based on ACC/AHA guidelines, NICKLAS MCSWEENEY Sr. would be at acceptable risk for the planned procedure without further cardiovascular testing.   Per Dr. Marlou Porch, patient may hold Plavix prior to procedure. He is noted to be off Eliquis. He will need clearance from Dr. Trula Slade prior to proceeding. This does not serve as Dr. Stephens Shire clearance.   I will route this recommendation to the requesting party via Epic fax function and remove from pre-op pool.  Please call with questions.  Kathyrn Drown, NP 09/18/2019, 12:52 PM

## 2019-09-18 NOTE — Telephone Encounter (Signed)
Garrett Jordan was calling again. The office has not gotten formal clearance. Past notes show they are waiting for Dr. Ellouise Newer provided Garrett Jordan with Dr. Stephens Shire office number.

## 2019-11-04 ENCOUNTER — Telehealth (HOSPITAL_COMMUNITY): Payer: Self-pay | Admitting: *Deleted

## 2019-11-04 NOTE — Telephone Encounter (Signed)
Contacted patient to update status of return to exercise in the cardiac rehab maintenance program. Patient aware that we will be temporarily suspending onsite maintenance exercise classes and would like to be contacted once we resume.  Sol Passer, MS, ACSM CEP 11/04/2019 1312

## 2019-12-19 NOTE — Telephone Encounter (Signed)
Thank you for the strips. Lets go ahead and set up with a Zio patch monitor for 14 days-diagnosis palpitations.  This will allow [name] to get a clear diagnosis.  Candee Furbish, MD

## 2019-12-22 ENCOUNTER — Telehealth: Payer: Self-pay | Admitting: *Deleted

## 2019-12-22 DIAGNOSIS — R002 Palpitations: Secondary | ICD-10-CM

## 2019-12-22 NOTE — Telephone Encounter (Deleted)
  ZIO XT- Long Term Monitor Instructions  Your physician has requested you wear your ZIO patch monitor___14____days.  This is a single patch monitor.  [employer] supplies one patch monitor per enrollment.  Additional stickers are not available.  Please do not apply patch if you will be having a Nuclear Stress Test, Echocardiogram, Cardiac CT, MRI, or Chest Xray during the time frame you would be wearing the monitor. The patch cannot be worn during these tests.  You cannot remove and re-apply the ZIO XT patch monitor.  Your ZIO patch monitor will be sent USPS Priority mail from [facility] directly to your home address. The monitor may also be mailed to a PO BOX if home delivery is not available.   It may take 3-5 days to receive your monitor after you have been enrolled.  Once you have received you monitor, please review enclosed instructions.  Your monitor has already been registered assigning a specific monitor serial # to you.  Applying the monitor  Shave hair from upper left chest.  Hold abrader disc by orange tab.  Rub abrader in 40 strokes over left upper chest as indicated in your monitor instructions.  Clean area with 4 enclosed alcohol pads.  Use all pads to assure are is cleaned thoroughly.  Let dry.  Apply patch as indicated in monitor instructions.  Patch will be place under collarbone on left side of chest with arrow pointing upward.  Rub patch adhesive wings for 2 minutes.  Remove white label marked "1".  Remove white label marked "2".  Rub patch adhesive wings for 2 additional minutes.  While looking in a mirror, press and release button in center of patch.  A small green light will flash 3-4 times.  This will be your only indicator the monitor has been turned on.    Do not shower for the first 24 hours.  You may shower after the first 24 hours.  Press button if you feel a symptom. You will hear a small click.  Record Date, Time and Symptom in the Patient Log Book.  When you are  ready to remove patch, follow instructions on last 2 pages of Patient Log Book.  Stick patch monitor onto last page of Patient Log Book.  Place Patient Log Book in [place] box.  Use locking tab on box and tape box closed securely.  The [employer] and [employer] has [employer] on it.  Please place in mailbox as soon as possible.  Your physician should have your test results approximately 7 days after the monitor has been mailed back to [facility].  Call [place] at [phone] if you have questions regarding your ZIO XT patch monitor.  Call them immediately if you see an orange light blinking on your monitor.  If your monitor falls off in less than 4 days contact our Monitor department at [phone].  If your monitor becomes loose or falls off after 4 days call [employer] at [phone] for suggestions on securing your monitor.

## 2019-12-22 NOTE — Telephone Encounter (Signed)
Thank you for the strips.  Lets go ahead and set up with a Zio patch monitor for 14 days-diagnosis palpitations. This will allow [name] to get a clear diagnosis.    Candee Furbish, MD     Message text

## 2019-12-23 ENCOUNTER — Encounter: Payer: Self-pay | Admitting: *Deleted

## 2019-12-23 NOTE — Telephone Encounter (Signed)
Enrolled for zio patch 2/8.

## 2019-12-23 NOTE — Progress Notes (Signed)
Patient ID: Garrett Jordan., male   DOB: Mar 30, 1941, 79 y.o.   MRN: [id number] 12/22/19 Patient enrolled for ZIO XT long term holter monitor to be mailed to his home.

## 2019-12-24 ENCOUNTER — Ambulatory Visit (INDEPENDENT_AMBULATORY_CARE_PROVIDER_SITE_OTHER): Payer: Medicare Other

## 2019-12-24 DIAGNOSIS — R002 Palpitations: Secondary | ICD-10-CM

## 2019-12-25 ENCOUNTER — Ambulatory Visit: Payer: Medicare Other | Attending: Internal Medicine

## 2019-12-25 DIAGNOSIS — Z23 Encounter for immunization: Secondary | ICD-10-CM

## 2019-12-25 NOTE — Progress Notes (Signed)
   [id number]  Name:  Garrett BELLS Sr.    MRN: [id number] DOB: [birth year]  12/25/2019  [name] was observed post Covid-19 immunization for 15 minutes without incidence. He was provided with Vaccine Information Sheet and instruction to access the V-Safe system.   [name] was instructed to call 911 with any severe reactions post vaccine: Marland Kitchen Difficulty breathing  . Swelling of your face and throat  . A fast heartbeat  . A bad rash all over your body  . Dizziness and weakness    Immunizations Administered    Name Date Dose VIS Date Route   Pfizer COVID-19 Vaccine 12/25/2019 10:45 AM 0.3 mL 10/24/2019 Intramuscular   Manufacturer: [employer]   Lot: [id number]   [place]: [id number]

## 2020-01-15 ENCOUNTER — Other Ambulatory Visit: Payer: Self-pay

## 2020-01-15 ENCOUNTER — Other Ambulatory Visit: Payer: Self-pay | Admitting: *Deleted

## 2020-01-15 ENCOUNTER — Encounter: Payer: Self-pay | Admitting: Cardiology

## 2020-01-15 ENCOUNTER — Ambulatory Visit: Payer: Medicare Other | Admitting: Cardiology

## 2020-01-15 VITALS — BP 138/86 | HR 57 | Ht 69.0 in | Wt 207.0 lb

## 2020-01-15 DIAGNOSIS — I2510 Atherosclerotic heart disease of native coronary artery without angina pectoris: Secondary | ICD-10-CM | POA: Diagnosis not present

## 2020-01-15 DIAGNOSIS — T82330D Leakage of aortic (bifurcation) graft (replacement), subsequent encounter: Secondary | ICD-10-CM | POA: Diagnosis not present

## 2020-01-15 DIAGNOSIS — I480 Paroxysmal atrial fibrillation: Secondary | ICD-10-CM

## 2020-01-15 DIAGNOSIS — I712 Thoracic aortic aneurysm, without rupture, unspecified: Secondary | ICD-10-CM

## 2020-01-15 DIAGNOSIS — IMO0001 Reserved for inherently not codable concepts without codable children: Secondary | ICD-10-CM

## 2020-01-15 DIAGNOSIS — Z951 Presence of aortocoronary bypass graft: Secondary | ICD-10-CM | POA: Diagnosis not present

## 2020-01-15 NOTE — Patient Instructions (Signed)

## 2020-01-15 NOTE — Progress Notes (Signed)
Cardiology Office Note:    Date:  01/15/2020   ID:  Garrett Jordan Sr., DOB 05-23-1941, MRN [id number]  PCP:  Antony Contras, MD  Cardiologist:  Candee Furbish, MD  Electrophysiologist:  None   Referring MD: Antony Contras, MD     History of Present Illness:    Garrett Jordan. is a 79 y.o. male prior paroxysmal atrial fibrillation with strips reported back in February 5188 that were uncertain for potential atrial fibrillation.  He was ordered a Air traffic controller.    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 Darcey Nora, diagnosis of paroxysmal atrial fibrillation in the past where monitor was worn and showed no recurrence and therefore he was taken off of Eliquis and amiodarone at that time.  Has hypertension with diabetes.  Dr. Trula Slade has been taking care of prior abdominal aortic aneurysm, coil embolization of right hypogastric artery, severe buttock claudication aneurysm repair was delayed, symptoms of claudication improved and in 2013 underwent endovascular repair of aneurysm.  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  2016 underwent coil embolization of type II endoleak.  Several prior EKGs were reviewed, interestingly he was discovered to be in atrial fibrillation during stress echocardiogram previously on 12/02/2018.  Has felt some occasional palps. At first intense vibration like phone in pocket. When wearing recent monitor felt palps.  Past Medical History:  Diagnosis Date  . AAA (abdominal aortic aneurysm) ([place])   . Arthritis    knees  . Atrial fibrillation ([place])   . CAD (coronary artery disease)   . Cancer [facility])    prostate  . Diabetes mellitus without complication ([place])   . GERD (gastroesophageal reflux disease)   . Gout   . Hyperlipidemia   . Hypertension   . Hypothyroidism   . Kidney stone    right kidney  . Myocardial infarction ([place]) 01/1991   sp inferior  . Nerve compression     right leg  . Reflux   . Seasonal allergies   . Thyroid disease    hypothyroidism    Past Surgical History:  Procedure Laterality Date  . ABDOMINAL AORTAGRAM N/A 02/28/2012   Procedure: ABDOMINAL Maxcine Ham;  Surgeon: Serafina Mitchell, MD;  Location: [facility] CATH LAB;  Service: Cardiovascular;  Laterality: N/A;  . abdominal aortagram embolization  02/28/2012  . ABDOMINAL AORTIC ANEURYSM REPAIR  Sept. 2013  . CORONARY ARTERY BYPASS GRAFT  04/2009  . [place] and  2009   RCA  . EMBOLIZATION Right 02/28/2012   Procedure: EMBOLIZATION;  Surgeon: Serafina Mitchell, MD;  Location: [facility] CATH LAB;  Service: Cardiovascular;  Laterality: Right;  . IR GENERIC HISTORICAL  12/05/2016   IR RADIOLOGIST EVAL & MGMT 12/05/2016 Jacqulynn Cadet, MD [facility] INTERV RAD  . IR RADIOLOGIST EVAL & MGMT  07/10/2019  . KIDNEY STONE SURGERY    . LEFT HEART CATHETERIZATION WITH CORONARY/GRAFT ANGIOGRAM N/A 01/08/2015   Procedure: LEFT HEART CATHETERIZATION WITH Beatrix Fetters;  Surgeon: Jacolyn Reedy, MD;  Location: [facility] CATH LAB;  Service: Cardiovascular;  Laterality: N/A;  . parathyroid adenoma    . PROSTATE SURGERY     rad seeds  . SPINE SURGERY    . TONSILLECTOMY      Current Medications: Current Meds  Medication Sig  . allopurinol (ZYLOPRIM) 300 MG tablet Take 300 mg by mouth every other day. Take in the evening - on even days of  the month  . amLODipine (NORVASC) 10 MG tablet Take 10 mg by mouth daily.  . Cholecalciferol (VITAMIN D3) 2000 UNITS TABS Take 2,000 Units by mouth daily.   . clonazePAM (KLONOPIN) 1 MG tablet Take 0.5 mg by mouth at bedtime.   . clopidogrel (PLAVIX) 75 MG tablet Take 1 tablet (75 mg total) by mouth daily.  . Coenzyme Q10 (EQL COQ10) 300 MG CAPS Take by mouth.  . esomeprazole (NEXIUM) 20 MG capsule Take 20 mg by mouth daily at 12 noon.  . fluorouracil (EFUDEX) 5 % cream Use as directed  . fluticasone (FLONASE) 50 MCG/ACT nasal spray Place 2 sprays into both  nostrils daily as needed for allergies.   . Glucose Blood (FREESTYLE TEST VI)   . ibuprofen (ADVIL,MOTRIN) 200 MG tablet Take 200 mg by mouth as needed.  Marland Kitchen JANUVIA 50 MG tablet Take 50 mg by mouth daily.  Marland Kitchen levothyroxine (SYNTHROID, LEVOTHROID) 175 MCG tablet Take 175 mcg by mouth daily before breakfast.  . loratadine-pseudoephedrine (CLARITIN-D 24-HOUR) 10-240 MG per 24 hr tablet Take 1 tablet by mouth at bedtime.   . metoprolol succinate (TOPROL-XL) 50 MG 24 hr tablet Take 1 tablet (50 mg total) by mouth daily. Take with or immediately following a meal.  . Multiple Vitamin (MULTIVITAMIN WITH MINERALS) TABS Take 1 tablet by mouth at bedtime.   . Multiple Vitamins-Minerals (PRESERVISION AREDS 2) CAPS Take 1 capsule by mouth 2 (two) times daily.  Marland Kitchen olmesartan-hydrochlorothiazide (BENICAR HCT) 40-25 MG tablet Take 1 tablet by mouth daily.  Marland Kitchen omega-3 acid ethyl esters (LOVAZA) 1 g capsule Take 2 capsules (2 g total) by mouth 2 (two) times daily.  . potassium chloride SA (K-DUR) 20 MEQ tablet   . rosuvastatin (CRESTOR) 10 MG tablet Take 10 mg by mouth as directed. Per patient, takes Mondays & Thursdays  . spironolactone (ALDACTONE) 25 MG tablet Take 0.5 tablets (12.5 mg total) by mouth every evening.  . Tamsulosin HCl (FLOMAX) 0.4 MG CAPS Take 0.4 mg by mouth every other day. Take in the evening - on even days of the month  . triamcinolone cream (KENALOG) 0.1 % Apply 1 application topically as needed.  Marland Kitchen VASCEPA 1 g capsule Take 2 g by mouth 2 (two) times daily.  . vitamin B-12 (CYANOCOBALAMIN) 1000 MCG tablet Take 2,500 mcg by mouth at bedtime.      Allergies:   Ace inhibitors, Crestor [rosuvastatin], and Nitroglycerin   Social History   Socioeconomic History  . Marital status: Divorced    Spouse name: Not on file  . Number of children: Not on file  . Years of education: Not on file  . Highest education level: Not on file  Occupational History  . Not on file  Tobacco Use  . Smoking  status: Former Smoker    Types: Cigarettes    Quit date: 08/01/1970    Years since quitting: 49.4  . Smokeless tobacco: Never Used  Substance and Sexual Activity  . Alcohol use: Yes    Alcohol/week: 6.0 - 9.0 standard drinks    Types: 4 - 5 Glasses of wine, 2 - 4 Cans of beer per week    Comment: 1-3 glasses 3-4 times a week  . Drug use: No  . Sexual activity: Not on file  Other Topics Concern  . Not on file  Social History Narrative  . Not on file   Social Determinants of Health   Financial Resource Strain:   . Difficulty of Paying Living Expenses: Not on  file  Food Insecurity:   . Worried About Charity fundraiser in the Last Year: Not on file  . Ran Out of Food in the Last Year: Not on file  Transportation Needs:   . Lack of Transportation (Medical): Not on file  . Lack of Transportation (Non-Medical): Not on file  Physical Activity:   . Days of Exercise per Week: Not on file  . Minutes of Exercise per Session: Not on file  Stress:   . Feeling of Stress : Not on file  Social Connections:   . Frequency of Communication with Friends and Family: Not on file  . Frequency of Social Gatherings with Friends and Family: Not on file  . Attends Religious Services: Not on file  . Active Member of Clubs or Organizations: Not on file  . Attends Archivist Meetings: Not on file  . Marital Status: Not on file     Family History: The patient's family history includes Deep vein thrombosis in his mother; Diabetes in his maternal grandmother, sister, and sister; Heart attack in his father and sister; Heart disease in his father, paternal uncle, and sister; Hyperlipidemia in his father and sister; Hypertension in his father and sister; Stroke in his mother. ROS:   Please see the history of present illness.     All other systems reviewed and are negative.  EKGs/Labs/Other Studies Reviewed:    The following studies were reviewed today:  ECHO 10/16/18 - Left ventricle: The  cavity size was normal. Wall thickness was increased in a pattern of moderate LVH. Systolic function was normal. Wall motion was normal; there were no regional wall motion abnormalities. - Mitral valve: There was mild regurgitation. - Right ventricle: The cavity size was moderately dilated. Wall thickness was normal. - RV appears enlarged, but accurate size estimation could not be done because of poor visualization.  Impressions:  - 1. Left ventricular systolic function is preserved visually estimated ejection fraction is 50 to 55%. Mild to moderate concentric left ventricular hypertrophy. 2. Mildly dilated left atrium. 3. Mildly dilated ascending aorta measured at 4.4 cm.  EKG:  EKG is  ordered today.  The ekg ordered today demonstrates sinus bradycardia 57 right bundle branch block PR interval currently 206 ms-several EKGs reviewed, right bundle branch block, sinus bradycardia first-degree AV block, 12/02/2018 EKGs to appear to be atrial fibrillation.  I do see a couple P waves interspersed however.  Recent Labs: No results found for requested labs within last 8760 hours.  Recent Lipid Panel    Component Value Date/Time   CHOL 195 01/08/2015 0404   TRIG 318 (H) 01/08/2015 0404   HDL 29 (L) 01/08/2015 0404   CHOLHDL 6.7 01/08/2015 0404   VLDL 64 (H) 01/08/2015 0404   LDLCALC 102 (H) 01/08/2015 0404    Physical Exam:    VS:  BP 138/86   Pulse (!) 57   Ht 5\' 9"  (1.753 m)   Wt 207 lb (93.9 kg)   SpO2 95%   BMI 30.57 kg/m     Wt Readings from Last 3 Encounters:  01/15/20 207 lb (93.9 kg)  07/28/19 204 lb 8 oz (92.8 kg)  07/17/19 203 lb (92.1 kg)     GEN:  Well nourished, well developed in no acute distress HEENT: Normal NECK: No JVD; No carotid bruits LYMPHATICS: No lymphadenopathy CARDIAC: RRR, no murmurs, rubs, gallops RESPIRATORY:  Clear to auscultation without rales, wheezing or rhonchi  ABDOMEN: Soft, non-tender, non-distended  MUSCULOSKELETAL:  No edema; No  deformity  SKIN: Warm and dry NEUROLOGIC:  Alert and oriented x 3 PSYCHIATRIC:  Normal affect   ASSESSMENT:    1. Paroxysmal atrial fibrillation (HCC)   2. Coronary artery disease involving native coronary artery of native heart without angina pectoris   3. S/P CABG (coronary artery bypass graft)   4. Endoleak post endovascular aneurysm repair, subsequent encounter    PLAN:    In order of problems listed above:  Coronary artery disease post CABG -Prior bypass surgery in 2010, stable without any anginal symptoms -First myocardial infarction occurred in 1992, was taken care of by Dr. Wynonia Lawman.  Continue with aggressive secondary risk factor prevention.  Dilated ascending aorta with descending aortic plaque/atherosclerosis -4.2 to 4.4 cm, yearly CT has been performed.  Dr. Darcey Nora has been monitoring  AAA -Post endovascular repair bifurcated stent in 2013 with type II endoleak repair in 2016.  Dr. Trula Slade has been following closely.  Reviewed his latest note.  Paroxysmal atrial fibrillation -What appears to be paroxysmal atrial fibrillation was noted on a stress echocardiogram on 12/02/2018 -Prior monitor from 08/19/2019 reviewed:  Predominant rhythm is sinus with interventricular conduction delay. There is an underlying first-degree AV block.  There is a 3-second pause on 07/24/2019 at 11:37 AM. No symptoms reported.  There is also a brief episode of ventricular bigeminy  There is a 12 beat run of paroxysmal atrial tachycardia.  Occasional PVCs noted.  Occasional second-degree heart block type I  There do not appear to be any episodes of atrial fibrillation   Given his transient periods of bradycardia, 3-second pause, no evidence of atrial fibrillation, I would like to discontinue his amiodarone 100 mg a day for fears of worsening conduction and potential pacemaker need.  Since there is no evidence of atrial fibrillation, I will discontinue his  Eliquis.  Continue with Plavix.  Of course, if atrial fibrillation were to present itself, anticoagulation once again would be indicated.  -Another Zio patch monitor was enrolled on 12/22/2019 given concern for possible atrial fibrillation again from a strip from [facility].  He just turned in the monitor.  We will review the results and if atrial fibrillation is present, restart Eliquis.  I will reach out to Dr. Arvella Nigh. Darcey Nora to see if we should continue with Plavix as well given his endovascular repair.  I would like to try to minimize his bleeding risks  Mixed hyperlipidemia  - 946 trigs - Now on Vascepa, Crestor as well.  - LDL 52  6 mth  Medication Adjustments/Labs and Tests Ordered: Current medicines are reviewed at length with the patient today.  Concerns regarding medicines are outlined above.  Orders Placed This Encounter  Procedures  . EKG 12-Lead   No orders of the defined types were placed in this encounter.   Patient Instructions  Medication Instructions:  The current medical regimen is effective;  continue present plan and medications.  *If you need a refill on your cardiac medications before your next appointment, please call your pharmacy*  Follow-Up: At [facility], you and your health needs are our priority.  As part of our continuing mission to provide you with exceptional heart care, we have created designated Provider Care Teams.  These Care Teams include your primary Cardiologist (physician) and Advanced Practice Providers (APPs -  Physician Assistants and Nurse Practitioners) who all work together to provide you with the care you need, when you need it.  We recommend signing up for the patient portal called "MyChart".  Sign up information  is provided on this After Visit Summary.  MyChart is used to connect with patients for Virtual Visits (Telemedicine).  Patients are able to view lab/test results, encounter notes, upcoming appointments, etc.  Non-urgent  messages can be sent to your provider as well.   To learn more about what you can do with MyChart, go to [email or web address].    Your next appointment:   6 month(s)  The format for your next appointment:   In Person  Provider:   Candee Furbish, MD   Thank you for choosing [facility]!!        Signed, Candee Furbish, MD  01/15/2020 9:26 AM    [place]

## 2020-01-17 ENCOUNTER — Ambulatory Visit: Payer: Medicare Other | Attending: Internal Medicine

## 2020-01-17 DIAGNOSIS — Z23 Encounter for immunization: Secondary | ICD-10-CM

## 2020-01-17 NOTE — Progress Notes (Signed)
   [id number]  Name:  Garrett BOEVE Sr.    MRN: [id number] DOB: Mar 12, 1941  01/17/2020  [name] was observed post Covid-19 immunization for 15 minutes without incident. He was provided with Vaccine Information Sheet and instruction to access the V-Safe system.   [name] was instructed to call 911 with any severe reactions post vaccine: Marland Kitchen Difficulty breathing  . Swelling of face and throat  . A fast heartbeat  . A bad rash all over body  . Dizziness and weakness   Immunizations Administered    Name Date Dose VIS Date Route   Pfizer COVID-19 Vaccine 01/17/2020 10:29 AM 0.3 mL 10/24/2019 Intramuscular   Manufacturer: [employer]   Lot: [id number]   [place]: [id number]

## 2020-01-19 ENCOUNTER — Telehealth: Payer: Self-pay | Admitting: *Deleted

## 2020-01-19 DIAGNOSIS — I455 Other specified heart block: Secondary | ICD-10-CM

## 2020-01-19 DIAGNOSIS — I459 Conduction disorder, unspecified: Secondary | ICD-10-CM

## 2020-01-19 NOTE — Telephone Encounter (Signed)
Jerline Pain, MD  Shellia Cleverly, RN  Cc: P Cv Div [facility] Triage   Sinus pauses x 3, longest is 3.1 to 3.8 second during day time hours.   Sinus rhythm, occasional PVC.   Rare atrial tachycardia.   Occasional baseline artifact but no clear evidence of atrial fibrialltion   This monitor is similar to prior.    Will discontinue metoprolol given pauses.  I would like for him to have an EP consult given his underlying conduction disease and potential need for pacemaker.  Candee Furbish, MD

## 2020-01-21 ENCOUNTER — Other Ambulatory Visit: Payer: Self-pay

## 2020-01-21 ENCOUNTER — Telehealth (INDEPENDENT_AMBULATORY_CARE_PROVIDER_SITE_OTHER): Payer: Medicare Other | Admitting: Internal Medicine

## 2020-01-21 VITALS — BP 159/81 | HR 91 | Wt 201.0 lb

## 2020-01-21 DIAGNOSIS — I252 Old myocardial infarction: Secondary | ICD-10-CM

## 2020-01-21 DIAGNOSIS — I4891 Unspecified atrial fibrillation: Secondary | ICD-10-CM

## 2020-01-21 DIAGNOSIS — I2510 Atherosclerotic heart disease of native coronary artery without angina pectoris: Secondary | ICD-10-CM | POA: Diagnosis not present

## 2020-01-21 DIAGNOSIS — I480 Paroxysmal atrial fibrillation: Secondary | ICD-10-CM

## 2020-01-21 DIAGNOSIS — I4430 Unspecified atrioventricular block: Secondary | ICD-10-CM | POA: Diagnosis not present

## 2020-01-21 DIAGNOSIS — Z7189 Other specified counseling: Secondary | ICD-10-CM

## 2020-01-21 DIAGNOSIS — R002 Palpitations: Secondary | ICD-10-CM

## 2020-01-21 DIAGNOSIS — Z951 Presence of aortocoronary bypass graft: Secondary | ICD-10-CM

## 2020-01-21 DIAGNOSIS — I10 Essential (primary) hypertension: Secondary | ICD-10-CM

## 2020-01-21 DIAGNOSIS — Z955 Presence of coronary angioplasty implant and graft: Secondary | ICD-10-CM

## 2020-01-21 NOTE — Patient Instructions (Signed)
Medication Instructions:  NO CHANGES *If you need a refill on your cardiac medications before your next appointment, please call your pharmacy*   Lab Work: NONE ORDERED If you have labs (blood work) drawn today and your tests are completely normal, you will receive your results only by: Marland Kitchen MyChart Message (if you have MyChart) OR . A paper copy in the mail If you have any lab test that is abnormal or we need to change your treatment, we will call you to review the results.   Testing/Procedures: NONE ORDERED   Follow-Up: AS ALREADY PLANNED WITH DR. Marlou Porch.  Other Instructions

## 2020-01-21 NOTE — Progress Notes (Signed)
Electrophysiology TeleHealth Note   Due to national recommendations of social distancing due to COVID 19, an audio/video telehealth visit is felt to be most appropriate for this patient at this time.  See MyChart message from today for the patient's consent to telehealth for [facility].   Date:  01/21/2020   ID:  Garrett Meek Sr., DOB March 06, 1941, MRN [id number]  Location: patient's home  Provider location: [address], [place]  Evaluation Performed: Initial Evaluation  PCP:  Antony Contras, MD  Cardiologist:  Candee Furbish, MD   Electrophysiologist:  None   Chief Complaint:    History of Present Illness:    Garrett Ashmore. is a 79 y.o. male who presents via audio/video conferencing for a telehealth consultation  today for  Pauses noted on monitors.  He has a Hx of CAD wi remote [place]; multiple stents CABG 2010 cx by post op afib and was discharged on amiodarone.  The next note that I saw 2016 by Dr. Judith Part he was not on it.  Currently resumed recently by Dr. Docia Furl.  He has had no atrial fibrillation of which he is aware since his bypass surgery.  Hence, Dr. MS discontinued amiodarone and apixaban.  Has had some palpitations and has had 2 monitors.  One in September and 1 in February.,Event Recorder personnally reviewed  Pauses x 3 > 3.5 sec  Occurring during waking hours   All pauses seem to be assoc with concomitant PP prolongation and concomitant AV block   Doesn't sleep well, doesn't nap but tends to nod off to sleep if sitting down  Denies chest pain.  No lightheadedness or presyncope.  No edema.  Mild shortness of breath.          DATE TEST EF   2/16 Echo   55-60 %   10/19 Echo   50-55 % LVH-mod  AoRoot 4.4 RVE  LA normal              Date Cr K Hgb TSH LFTs  3/20 1.75 5.1 15.5 0.733 24             The patient denies symptoms of fevers, chills, cough, or new SOB worrisome for COVID 19.    Past Medical History:  Diagnosis Date  . AAA  (abdominal aortic aneurysm) ([place])   . Arthritis    knees  . Atrial fibrillation ([place])   . CAD (coronary artery disease)   . Cancer [facility])    prostate  . Diabetes mellitus without complication ([place])   . GERD (gastroesophageal reflux disease)   . Gout   . Hyperlipidemia   . Hypertension   . Hypothyroidism   . Kidney stone    right kidney  . Myocardial infarction ([place]) 01/1991   sp inferior  . Nerve compression    right leg  . Reflux   . Seasonal allergies   . Thyroid disease    hypothyroidism    Past Surgical History:  Procedure Laterality Date  . ABDOMINAL AORTAGRAM N/A 02/28/2012   Procedure: ABDOMINAL Maxcine Ham;  Surgeon: Serafina Mitchell, MD;  Location: [facility] CATH LAB;  Service: Cardiovascular;  Laterality: N/A;  . abdominal aortagram embolization  02/28/2012  . ABDOMINAL AORTIC ANEURYSM REPAIR  Sept. 2013  . CORONARY ARTERY BYPASS GRAFT  04/2009  . [place] and  2009   RCA  . EMBOLIZATION Right 02/28/2012   Procedure: EMBOLIZATION;  Surgeon: Serafina Mitchell, MD;  Location:  [place] CATH LAB;  Service: Cardiovascular;  Laterality: Right;  . IR GENERIC HISTORICAL  12/05/2016   IR RADIOLOGIST EVAL & MGMT 12/05/2016 Jacqulynn Cadet, MD [facility] INTERV RAD  . IR RADIOLOGIST EVAL & MGMT  07/10/2019  . KIDNEY STONE SURGERY    . LEFT HEART CATHETERIZATION WITH CORONARY/GRAFT ANGIOGRAM N/A 01/08/2015   Procedure: LEFT HEART CATHETERIZATION WITH Beatrix Fetters;  Surgeon: Jacolyn Reedy, MD;  Location: [facility] CATH LAB;  Service: Cardiovascular;  Laterality: N/A;  . parathyroid adenoma    . PROSTATE SURGERY     rad seeds  . SPINE SURGERY    . TONSILLECTOMY      Current Outpatient Medications  Medication Sig Dispense Refill  . allopurinol (ZYLOPRIM) 300 MG tablet Take 300 mg by mouth every other day. Take in the evening - on even days of the month    . amLODipine (NORVASC) 10 MG tablet Take 10 mg by mouth daily.    . Cholecalciferol (VITAMIN D3) 2000 UNITS TABS  Take 2,000 Units by mouth daily.     . clonazePAM (KLONOPIN) 1 MG tablet Take 0.5 mg by mouth at bedtime.     . clopidogrel (PLAVIX) 75 MG tablet Take 1 tablet (75 mg total) by mouth daily. 90 tablet 1  . Coenzyme Q10 (EQL COQ10) 300 MG CAPS Take by mouth.    . esomeprazole (NEXIUM) 20 MG capsule Take 20 mg by mouth daily at 12 noon.    . fluorouracil (EFUDEX) 5 % cream Use as directed    . fluticasone (FLONASE) 50 MCG/ACT nasal spray Place 2 sprays into both nostrils daily as needed for allergies.     . Glucose Blood (FREESTYLE TEST VI)     . ibuprofen (ADVIL,MOTRIN) 200 MG tablet Take 200 mg by mouth as needed.    Marland Kitchen JANUVIA 50 MG tablet Take 50 mg by mouth daily.  1  . levothyroxine (SYNTHROID, LEVOTHROID) 175 MCG tablet Take 175 mcg by mouth daily before breakfast.    . loratadine-pseudoephedrine (CLARITIN-D 24-HOUR) 10-240 MG per 24 hr tablet Take 1 tablet by mouth at bedtime.     . Multiple Vitamin (MULTIVITAMIN WITH MINERALS) TABS Take 1 tablet by mouth at bedtime.     . Multiple Vitamins-Minerals (PRESERVISION AREDS 2) CAPS Take 1 capsule by mouth 2 (two) times daily.    Marland Kitchen olmesartan-hydrochlorothiazide (BENICAR HCT) 40-25 MG tablet Take 1 tablet by mouth daily. 60 tablet 3  . potassium chloride SA (K-DUR) 20 MEQ tablet     . rosuvastatin (CRESTOR) 10 MG tablet Take 10 mg by mouth as directed. Per patient, takes Mondays & Thursdays    . spironolactone (ALDACTONE) 25 MG tablet Take 0.5 tablets (12.5 mg total) by mouth every evening. 30 tablet 4  . Tamsulosin HCl (FLOMAX) 0.4 MG CAPS Take 0.4 mg by mouth every other day. Take in the evening - on even days of the month    . triamcinolone cream (KENALOG) 0.1 % Apply 1 application topically as needed.  2  . VASCEPA 1 g capsule Take 2 g by mouth 2 (two) times daily.    . vitamin B-12 (CYANOCOBALAMIN) 1000 MCG tablet Take 2,500 mcg by mouth at bedtime.     Marland Kitchen omega-3 acid ethyl esters (LOVAZA) 1 g capsule Take 2 capsules (2 g total) by mouth 2  (two) times daily. (Patient not taking: Reported on 01/21/2020) 180 capsule 1   No current facility-administered medications for this visit.    Allergies:   Ace inhibitors, Crestor [rosuvastatin],  Nitroglycerin, and Quinolones   Social History:  The patient  reports that he quit smoking about 49 years ago. His smoking use included cigarettes. He has never used smokeless tobacco. He reports current alcohol use of about 6.0 - 9.0 standard drinks of alcohol per week. He reports that he does not use drugs.   Family History:  The patient's   family history includes Deep vein thrombosis in his mother; Diabetes in his maternal grandmother, sister, and sister; Heart attack in his father and sister; Heart disease in his father, paternal uncle, and sister; Hyperlipidemia in his father and sister; Hypertension in his father and sister; Stroke in his mother.   ROS:  Please see the history of present illness.   All other systems are personally reviewed and negative.    Exam:    Vital Signs:  BP (!) 159/81 Comment: usually 130s/80s, feeling anxious for today's appointment  Pulse 91   Wt 201 lb (91.2 kg)   BMI 29.68 kg/m     Labs/Other Tests and Data Reviewed:    Recent Labs: No results found for requested labs within last 8760 hours.   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  There is simultaneous AV block and sinus slowing implicating an extracardiac hypervagotonic effect-- likely related to sleep apnea. His history suggests sleeping in chair  and this may be why more common than at night perhaps when his head slouches forward,  In any case, given evidence of extracardiac effect and lack of symptoms no therapy is indicated  Agree with prior decision to discontinue apixoban and amiodarone-- as best as I can tell the only event was post XABG    COVID 19 screen The patient denies symptoms of COVID 19 at this time.  The importance of social distancing was discussed today.  Follow-up:  none Next remote: n/a  Current medicines are reviewed at length with the patient today.   The patient does not have concerns regarding his medicines.  The following changes were made today:  none  Labs/ tests ordered today include:   No orders of the defined types were placed in this encounter.   Future tests ( post COVID )     Patient Risk:  after full review of this patients clinical status, I feel that they are at moderate risk at this time.  Today, I have spent 25 (or so) minutes with the patient with telehealth technology discussingthe above    Signed, Virl Axe, MD  01/21/2020 8:35 PM     [facility] [address] [place] [address] [phone] (office) [phone] (fax)

## 2020-01-26 ENCOUNTER — Institutional Professional Consult (permissible substitution): Payer: Medicare Other | Admitting: Internal Medicine

## 2020-01-29 NOTE — Telephone Encounter (Signed)
Pam,  Second note..  Sorry, I looked at the monitor report and we DID discontinue the metoprolol as well as amiodarone. Let's stay off of both for now.   My fear was pause during the day and further blocking the AV node.  Being off of the metoprolol and amiodarone has resulted in some of this tachycardia.  I wanted him to see EP to discuss. May need pacer for backup so that we can treat his faster heart rates.   What is the status on this referral.   Thanks  Candee Furbish, MD

## 2020-01-29 NOTE — Telephone Encounter (Signed)
Pam,  We discontinued amiodarone after the monitor, not the metoprolol. Please restart. Vascepa does not cause tachycardia  Thanks  Candee Furbish, MD

## 2020-02-02 NOTE — Telephone Encounter (Signed)
Saw the pt 3/10  Not sure what happened to notes Sorry

## 2020-02-06 ENCOUNTER — Telehealth: Payer: Self-pay | Admitting: *Deleted

## 2020-02-06 DIAGNOSIS — Z79899 Other long term (current) drug therapy: Secondary | ICD-10-CM

## 2020-02-06 DIAGNOSIS — I119 Hypertensive heart disease without heart failure: Secondary | ICD-10-CM

## 2020-02-06 MED ORDER — METOPROLOL TARTRATE 25 MG PO TABS: 25.0000 mg | ORAL_TABLET | Freq: Two times a day (BID) | ORAL | 3 refills | Status: DC

## 2020-02-06 MED ORDER — SPIRONOLACTONE 25 MG PO TABS: 25.0000 mg | ORAL_TABLET | Freq: Every day | ORAL | 3 refills | Status: DC

## 2020-02-06 NOTE — Telephone Encounter (Signed)
Good afternoon,  I have had Dr Marlou Porch review this.  He would like for you to increase your spironolactone to 25 mg a day and start Metoprolol tartrate 25 mg one tablet twice a day.  He would also like for you to schedule a 1 month follow up appointment.   The Vascepa would not cause a fast heart rate. Please let me know where you would like the prescriptions sent to and if you prefer a certain day of the week or time of day to schedule your follow up appointment.  Thank you,  Pam, RN for Dr Marlou Porch   Pt is aware of the information above.  Dr Marlou Porch has also ordered for pt to d/c his potassium supplement and check BMP in 1 week.  I scheduled that for 4/2.  Scheduled 1 month f/u appt with Dr Marlou Porch for 4/27.  I have sent this information to the patient via MyChart and requested he let me know should he have any questions or need to reschedule.

## 2020-02-13 ENCOUNTER — Other Ambulatory Visit: Payer: Self-pay

## 2020-02-13 ENCOUNTER — Other Ambulatory Visit: Payer: Medicare Other | Admitting: *Deleted

## 2020-02-13 DIAGNOSIS — I119 Hypertensive heart disease without heart failure: Secondary | ICD-10-CM

## 2020-02-13 DIAGNOSIS — Z79899 Other long term (current) drug therapy: Secondary | ICD-10-CM

## 2020-02-13 LAB — BASIC METABOLIC PANEL
BUN/Creatinine Ratio: 18 (ref 10–24)
BUN: 35 mg/dL — ABNORMAL HIGH (ref 8–27)
CO2: 23 mmol/L (ref 20–29)
Calcium: 9.8 mg/dL (ref 8.6–10.2)
Chloride: 101 mmol/L (ref 96–106)
Creatinine, Ser: 1.94 mg/dL — ABNORMAL HIGH (ref 0.76–1.27)
GFR calc Af Amer: 37 mL/min/{1.73_m2} — ABNORMAL LOW (ref >59)
GFR calc non Af Amer: 32 mL/min/{1.73_m2} — ABNORMAL LOW (ref >59)
Glucose: 178 mg/dL — ABNORMAL HIGH (ref 65–99)
Potassium: 4.1 mmol/L (ref 3.5–5.2)
Sodium: 138 mmol/L (ref 134–144)

## 2020-02-18 ENCOUNTER — Other Ambulatory Visit: Payer: Self-pay | Admitting: *Deleted

## 2020-02-18 DIAGNOSIS — N289 Disorder of kidney and ureter, unspecified: Secondary | ICD-10-CM

## 2020-02-18 NOTE — Progress Notes (Signed)
Pt aware he is being referred to [facility] d/t elevated renal function.

## 2020-03-03 ENCOUNTER — Other Ambulatory Visit: Payer: Self-pay | Admitting: Cardiothoracic Surgery

## 2020-03-03 ENCOUNTER — Ambulatory Visit: Payer: Medicare Other | Admitting: Cardiothoracic Surgery

## 2020-03-03 ENCOUNTER — Encounter: Payer: Self-pay | Admitting: Cardiothoracic Surgery

## 2020-03-03 ENCOUNTER — Ambulatory Visit
Admission: RE | Admit: 2020-03-03 | Discharge: 2020-03-03 | Disposition: A | Discharge status: Home / Self Care | Payer: Medicare Other | Source: Ambulatory Visit | Attending: Cardiothoracic Surgery | Admitting: Cardiothoracic Surgery

## 2020-03-03 ENCOUNTER — Other Ambulatory Visit: Payer: Self-pay

## 2020-03-03 DIAGNOSIS — I2510 Atherosclerotic heart disease of native coronary artery without angina pectoris: Secondary | ICD-10-CM

## 2020-03-03 DIAGNOSIS — I712 Thoracic aortic aneurysm, without rupture, unspecified: Secondary | ICD-10-CM

## 2020-03-03 DIAGNOSIS — I719 Aortic aneurysm of unspecified site, without rupture: Secondary | ICD-10-CM | POA: Diagnosis not present

## 2020-03-03 MED ORDER — IOPAMIDOL (ISOVUE-370) INJECTION 76%
50.0000 mL | Freq: Once | INTRAVENOUS | Status: AC | PRN
  Administered 2020-03-03: 50 mL via INTRAVENOUS

## 2020-03-03 NOTE — Progress Notes (Signed)
PCP is Antony Contras, MD Referring Provider is Antony Contras, MD  Chief Complaint  Patient presents with  . Thoracic Aortic Aneurysm    1 year f/u w/ CTA today     HPI: The patient returns for annual follow-up with CTA.  Patient had a multivessel CABG in 2010.  He has had a abdominal stent graft for aneurysm by Dr. Trula Slade. 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  It is asymptomatic.  I reviewed the images performed in his CT scan today which shows no change in the small 1 cm penetrating ulcer.  The ascending aorta remains normal in size, 41 mm diameter.  He is at low risk for developing complications from the small penetrating ulcer but will continue to follow with annual scan.  Patient states his exercise tolerance has decreased over the past few months.  He also has had some sharp pains in his chest is as well as some vibrations in his chest.  He had a heart monitor for 3 days which showed no arrhythmia. He had multivessel bypass surgery 11 years ago so I will arrange for a Myoview scan as a screening assessment of his coronary perfusion.  Past Medical History:  Diagnosis Date  . AAA (abdominal aortic aneurysm) ([place])   . Arthritis    knees  . Atrial fibrillation ([place])   . CAD (coronary artery disease)   . Cancer [facility])    prostate  . Diabetes mellitus without complication ([place])   . GERD (gastroesophageal reflux disease)   . Gout   . Hyperlipidemia   . Hypertension   . Hypothyroidism   . Kidney stone    right kidney  . Myocardial infarction ([place]) 01/1991   sp inferior  . Nerve compression    right leg  . Reflux   . Seasonal allergies   . Thyroid disease    hypothyroidism    Past Surgical History:  Procedure Laterality Date  . ABDOMINAL AORTAGRAM N/A 02/28/2012   Procedure: ABDOMINAL Maxcine Ham;  Surgeon: Serafina Mitchell, MD;  Location: [facility] CATH LAB;  Service: Cardiovascular;  Laterality: N/A;  . abdominal aortagram  embolization  02/28/2012  . ABDOMINAL AORTIC ANEURYSM REPAIR  Sept. 2013  . CORONARY ARTERY BYPASS GRAFT  04/2009  . [place] and  2009   RCA  . EMBOLIZATION Right 02/28/2012   Procedure: EMBOLIZATION;  Surgeon: Serafina Mitchell, MD;  Location: [facility] CATH LAB;  Service: Cardiovascular;  Laterality: Right;  . IR GENERIC HISTORICAL  12/05/2016   IR RADIOLOGIST EVAL & MGMT 12/05/2016 Jacqulynn Cadet, MD [facility] INTERV RAD  . IR RADIOLOGIST EVAL & MGMT  07/10/2019  . KIDNEY STONE SURGERY    . LEFT HEART CATHETERIZATION WITH CORONARY/GRAFT ANGIOGRAM N/A 01/08/2015   Procedure: LEFT HEART CATHETERIZATION WITH Beatrix Fetters;  Surgeon: Jacolyn Reedy, MD;  Location: [facility] CATH LAB;  Service: Cardiovascular;  Laterality: N/A;  . parathyroid adenoma    . PROSTATE SURGERY     rad seeds  . SPINE SURGERY    . TONSILLECTOMY      Family History  Problem Relation Age of Onset  . Heart disease Father        Heart Disease before age 50  . Heart attack Father   . Hyperlipidemia Father   . Hypertension Father   . Stroke Mother   . Deep vein thrombosis Mother   . Heart disease Sister   . Diabetes  Sister   . Hyperlipidemia Sister   . Hypertension Sister   . Heart attack Sister   . Heart disease Paternal Uncle   . Diabetes Maternal Grandmother   . Diabetes Sister     Social History Social History   Tobacco Use  . Smoking status: Former Smoker    Types: Cigarettes    Quit date: 08/01/1970    Years since quitting: 49.6  . Smokeless tobacco: Never Used  Substance Use Topics  . Alcohol use: Yes    Alcohol/week: 6.0 - 9.0 standard drinks    Types: 4 - 5 Glasses of wine, 2 - 4 Cans of beer per week    Comment: 1-3 glasses 3-4 times a week  . Drug use: No    Current Outpatient Medications  Medication Sig Dispense Refill  . allopurinol (ZYLOPRIM) 300 MG tablet Take 300 mg by mouth every other day. Take in the evening - on even days of the month    . amLODipine  (NORVASC) 10 MG tablet Take 10 mg by mouth daily.    . Cholecalciferol (VITAMIN D3) 2000 UNITS TABS Take 2,000 Units by mouth daily.     . clonazePAM (KLONOPIN) 1 MG tablet Take 0.5 mg by mouth at bedtime.     . clopidogrel (PLAVIX) 75 MG tablet Take 1 tablet (75 mg total) by mouth daily. 90 tablet 1  . Coenzyme Q10 (EQL COQ10) 300 MG CAPS Take by mouth.    . esomeprazole (NEXIUM) 20 MG capsule Take 20 mg by mouth daily at 12 noon.    . fluorouracil (EFUDEX) 5 % cream Use as directed    . fluticasone (FLONASE) 50 MCG/ACT nasal spray Place 2 sprays into both nostrils daily as needed for allergies.     . Glucose Blood (FREESTYLE TEST VI)     . ibuprofen (ADVIL,MOTRIN) 200 MG tablet Take 200 mg by mouth as needed.    Marland Kitchen JANUVIA 50 MG tablet Take 50 mg by mouth daily.  1  . levothyroxine (SYNTHROID, LEVOTHROID) 175 MCG tablet Take 175 mcg by mouth daily before breakfast.    . loratadine-pseudoephedrine (CLARITIN-D 24-HOUR) 10-240 MG per 24 hr tablet Take 1 tablet by mouth at bedtime.     . metoprolol tartrate (LOPRESSOR) 25 MG tablet Take 1 tablet (25 mg total) by mouth 2 (two) times daily. 180 tablet 3  . Multiple Vitamin (MULTIVITAMIN WITH MINERALS) TABS Take 1 tablet by mouth at bedtime.     . Multiple Vitamins-Minerals (PRESERVISION AREDS 2) CAPS Take 1 capsule by mouth 2 (two) times daily.    Marland Kitchen olmesartan-hydrochlorothiazide (BENICAR HCT) 40-25 MG tablet Take 1 tablet by mouth daily. 60 tablet 3  . omega-3 acid ethyl esters (LOVAZA) 1 g capsule Take 2 capsules (2 g total) by mouth 2 (two) times daily. (Patient not taking: Reported on 01/21/2020) 180 capsule 1  . rosuvastatin (CRESTOR) 10 MG tablet Take 10 mg by mouth as directed. Per patient, takes Mondays & Thursdays    . spironolactone (ALDACTONE) 25 MG tablet Take 1 tablet (25 mg total) by mouth daily. (Patient not taking: Reported on 03/03/2020) 90 tablet 3  . Tamsulosin HCl (FLOMAX) 0.4 MG CAPS Take 0.4 mg by mouth every other day. Take in the  evening - on even days of the month    . triamcinolone cream (KENALOG) 0.1 % Apply 1 application topically as needed.  2  . VASCEPA 1 g capsule Take 2 g by mouth 2 (two) times daily.    Marland Kitchen  vitamin B-12 (CYANOCOBALAMIN) 1000 MCG tablet Take 2,500 mcg by mouth at bedtime.      No current facility-administered medications for this visit.    Allergies  Allergen Reactions  . Ace Inhibitors Cough  . Crestor [Rosuvastatin] Other (See Comments)    Muscle aches  . Nitroglycerin Other (See Comments)    Very pronounced lowered BP with IV nitro for caths  . Quinolones     Aortic root enlargement    Review of Systems  No symptoms of CHF, no clear symptoms of angina No hospitalizations in the past year No bleeding complications from Plavix No TIA symptoms or transient neurologic symptoms  BP (!) 150/87   Pulse (!) 58   Temp 98.2 F (36.8 C) (Temporal)   Resp 20   Ht 5\' 10"  (1.778 m)   Wt 200 lb (90.7 kg)   SpO2 97% Comment: RA  BMI 28.70 kg/m  Physical Exam       Exam    General- alert and comfortable    Neck- no JVD, no cervical adenopathy palpable, no carotid bruit   Lungs- clear without rales, wheezes   Cor- regular rate and rhythm, no murmur , gallop   Abdomen- soft, non-tender   Extremities - warm, non-tender, minimal edema   Neuro- oriented, appropriate, no focal weakness  Diagnostic Tests: CTA of thoracic aorta shows no dilatation with a diameter 4.0 cm.  There is some diffuse calcification and a small, stable, asymptomatic 1.2 cm penetrating ulcer of the arch.  Impression: Stable small penetrating ulcer, asymptomatic.  Continue Plavix, continue Crestor and blood pressure control. Atypical left chest pain with some decrease in exercise tolerance.  History of CABG 2010.  Will assess coronary flow with Myoview scan. Plan: Return in 2 to 3 weeks after Myoview scan.  Return in 1 year for repeat CTA of thoracic aorta.  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

## 2020-03-08 NOTE — Telephone Encounter (Signed)
Patient given detailed instructions per Myocardial Perfusion Study Information Sheet for the test on 03/10/20 Patient notified to arrive 15 minutes early and that it is imperative to arrive on time for appointment to keep from having the test rescheduled.  If you need to cancel or reschedule your appointment, please call the office within 24 hours of your appointment. . Patient verbalized understanding. Garrett Jordan

## 2020-03-09 ENCOUNTER — Ambulatory Visit: Payer: Medicare Other | Admitting: Cardiology

## 2020-03-09 ENCOUNTER — Other Ambulatory Visit: Payer: Self-pay

## 2020-03-09 ENCOUNTER — Encounter: Payer: Self-pay | Admitting: Cardiology

## 2020-03-09 VITALS — BP 132/74 | HR 61 | Ht 70.0 in | Wt 234.0 lb

## 2020-03-09 DIAGNOSIS — I2510 Atherosclerotic heart disease of native coronary artery without angina pectoris: Secondary | ICD-10-CM | POA: Diagnosis not present

## 2020-03-09 DIAGNOSIS — R002 Palpitations: Secondary | ICD-10-CM | POA: Diagnosis not present

## 2020-03-09 DIAGNOSIS — N1831 Chronic kidney disease, stage 3a: Secondary | ICD-10-CM

## 2020-03-09 DIAGNOSIS — Z951 Presence of aortocoronary bypass graft: Secondary | ICD-10-CM

## 2020-03-09 DIAGNOSIS — N289 Disorder of kidney and ureter, unspecified: Secondary | ICD-10-CM | POA: Diagnosis not present

## 2020-03-09 NOTE — Progress Notes (Signed)
Cardiology Office Note:    Date:  03/10/2020   ID:  Garrett Jordan Sr., DOB Nov 05, 1941, MRN [id number]  PCP:  Antony Contras, MD  Cardiologist:  Candee Furbish, MD  Electrophysiologist:  None   Referring MD: Antony Contras, MD     History of Present Illness:    Garrett Jordan. is a 79 y.o. male here for follow-up of coronary artery disease.    CABG 2010-Dr. Vantrigt Abdominal aortic aneurysm stent graft-Dr. 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  Has noticed some decreased exercise tolerance over the last several months.  Has had some atypical sharp type pain and vibrations in his chest.   Past Medical History:  Diagnosis Date  . AAA (abdominal aortic aneurysm) ([place])   . Arthritis    knees  . Atrial fibrillation ([place])   . CAD (coronary artery disease)   . Cancer [facility])    prostate  . Diabetes mellitus without complication ([place])   . GERD (gastroesophageal reflux disease)   . Gout   . Hyperlipidemia   . Hypertension   . Hypothyroidism   . Kidney stone    right kidney  . Myocardial infarction ([place]) 01/1991   sp inferior  . Nerve compression    right leg  . Reflux   . Seasonal allergies   . Thyroid disease    hypothyroidism    Past Surgical History:  Procedure Laterality Date  . ABDOMINAL AORTAGRAM N/A 02/28/2012   Procedure: ABDOMINAL Maxcine Ham;  Surgeon: Serafina Mitchell, MD;  Location: [facility] CATH LAB;  Service: Cardiovascular;  Laterality: N/A;  . abdominal aortagram embolization  02/28/2012  . ABDOMINAL AORTIC ANEURYSM REPAIR  Sept. 2013  . CORONARY ARTERY BYPASS GRAFT  04/2009  . [employer] and  2009   RCA  . EMBOLIZATION Right 02/28/2012   Procedure: EMBOLIZATION;  Surgeon: Serafina Mitchell, MD;  Location: [facility] CATH LAB;  Service: Cardiovascular;  Laterality: Right;  . IR GENERIC HISTORICAL  12/05/2016   IR RADIOLOGIST EVAL & MGMT 12/05/2016 Jacqulynn Cadet, MD [facility] INTERV RAD   . IR RADIOLOGIST EVAL & MGMT  07/10/2019  . KIDNEY STONE SURGERY    . LEFT HEART CATHETERIZATION WITH CORONARY/GRAFT ANGIOGRAM N/A 01/08/2015   Procedure: LEFT HEART CATHETERIZATION WITH Beatrix Fetters;  Surgeon: Jacolyn Reedy, MD;  Location: [facility] CATH LAB;  Service: Cardiovascular;  Laterality: N/A;  . parathyroid adenoma    . PROSTATE SURGERY     rad seeds  . SPINE SURGERY    . TONSILLECTOMY      Current Medications: Current Meds  Medication Sig  . allopurinol (ZYLOPRIM) 300 MG tablet Take 300 mg by mouth every other day. Take in the evening - on even days of the month  . amLODipine (NORVASC) 10 MG tablet Take 10 mg by mouth daily.  . Cholecalciferol (VITAMIN D3) 2000 UNITS TABS Take 2,000 Units by mouth daily.   . clonazePAM (KLONOPIN) 1 MG tablet Take 0.5 mg by mouth at bedtime.   . clopidogrel (PLAVIX) 75 MG tablet Take 1 tablet (75 mg total) by mouth daily.  . Coenzyme Q10 (EQL COQ10) 300 MG CAPS Take by mouth.  . esomeprazole (NEXIUM) 20 MG capsule Take 20 mg by mouth daily at 12 noon.  . fluorouracil (EFUDEX) 5 % cream Use as directed  . fluticasone (FLONASE) 50 MCG/ACT nasal spray Place 2 sprays into both nostrils daily as needed for allergies.   Marland Kitchen ibuprofen (ADVIL,MOTRIN)  200 MG tablet Take 200 mg by mouth as needed.  Marland Kitchen JANUVIA 50 MG tablet Take 50 mg by mouth daily.  Marland Kitchen levothyroxine (SYNTHROID, LEVOTHROID) 175 MCG tablet Take 175 mcg by mouth daily before breakfast.  . loratadine-pseudoephedrine (CLARITIN-D 24-HOUR) 10-240 MG per 24 hr tablet Take 1 tablet by mouth at bedtime.   . metoprolol tartrate (LOPRESSOR) 25 MG tablet Take 1 tablet (25 mg total) by mouth 2 (two) times daily.  . Multiple Vitamin (MULTIVITAMIN WITH MINERALS) TABS Take 1 tablet by mouth at bedtime.   . Multiple Vitamins-Minerals (PRESERVISION AREDS 2) CAPS Take 1 capsule by mouth 2 (two) times daily.  Marland Kitchen olmesartan-hydrochlorothiazide (BENICAR HCT) 40-25 MG tablet Take 1 tablet by mouth daily.   . rosuvastatin (CRESTOR) 10 MG tablet Take 10 mg by mouth as directed. Per patient, takes Mondays & Thursdays  . spironolactone (ALDACTONE) 25 MG tablet Take 1 tablet (25 mg total) by mouth daily.  . Tamsulosin HCl (FLOMAX) 0.4 MG CAPS Take 0.4 mg by mouth every other day. Take in the evening - on even days of the month  . triamcinolone cream (KENALOG) 0.1 % Apply 1 application topically as needed.  Marland Kitchen VASCEPA 1 g capsule Take 2 g by mouth 2 (two) times daily.  . vitamin B-12 (CYANOCOBALAMIN) 1000 MCG tablet Take 2,500 mcg by mouth at bedtime.      Allergies:   Ace inhibitors, Crestor [rosuvastatin], Nitroglycerin, and Quinolones   Social History   Socioeconomic History  . Marital status: Divorced    Spouse name: Not on file  . Number of children: Not on file  . Years of education: Not on file  . Highest education level: Not on file  Occupational History  . Not on file  Tobacco Use  . Smoking status: Former Smoker    Types: Cigarettes    Quit date: 08/01/1970    Years since quitting: 49.6  . Smokeless tobacco: Never Used  Substance and Sexual Activity  . Alcohol use: Yes    Alcohol/week: 6.0 - 9.0 standard drinks    Types: 4 - 5 Glasses of wine, 2 - 4 Cans of beer per week    Comment: 1-3 glasses 3-4 times a week  . Drug use: No  . Sexual activity: Not on file  Other Topics Concern  . Not on file  Social History Narrative  . Not on file   Social Determinants of Health   Financial Resource Strain:   . Difficulty of Paying Living Expenses:   Food Insecurity:   . Worried About Charity fundraiser in the Last Year:   . Arboriculturist in the Last Year:   Transportation Needs:   . Film/video editor (Medical):   Marland Kitchen Lack of Transportation (Non-Medical):   Physical Activity:   . Days of Exercise per Week:   . Minutes of Exercise per Session:   Stress:   . Feeling of Stress :   Social Connections:   . Frequency of Communication with Friends and Family:   . Frequency  of Social Gatherings with Friends and Family:   . Attends Religious Services:   . Active Member of Clubs or Organizations:   . Attends Archivist Meetings:   Marland Kitchen Marital Status:      Family History: The patient's family history includes Deep vein thrombosis in his mother; Diabetes in his maternal grandmother, sister, and sister; Heart attack in his father and sister; Heart disease in his father, paternal uncle, and sister;  Hyperlipidemia in his father and sister; Hypertension in his father and sister; Stroke in his mother.   EKGs/Labs/Other Studies Reviewed:    The following studies were reviewed today:  Echo 2019-EF 50%    Recent Labs: 02/13/2020: BUN 35; Creatinine, Ser 1.94; Potassium 4.1; Sodium 138  Recent Lipid Panel    Component Value Date/Time   CHOL 195 01/08/2015 0404   TRIG 318 (H) 01/08/2015 0404   HDL 29 (L) 01/08/2015 0404   CHOLHDL 6.7 01/08/2015 0404   VLDL 64 (H) 01/08/2015 0404   LDLCALC 102 (H) 01/08/2015 0404    Physical Exam:    VS:  BP 132/74   Pulse 61   Ht 5\' 10"  (1.778 m)   Wt 234 lb (106.1 kg)   SpO2 97%   BMI 33.58 kg/m     Wt Readings from Last 3 Encounters:  03/10/20 200 lb (90.7 kg)  03/09/20 234 lb (106.1 kg)  03/03/20 200 lb (90.7 kg)     GEN:  Well nourished, well developed in no acute distress HEENT: Normal NECK: No JVD; No carotid bruits LYMPHATICS: No lymphadenopathy CARDIAC: RRR, no murmurs, rubs, gallops RESPIRATORY:  Clear to auscultation without rales, wheezing or rhonchi  ABDOMEN: Soft, non-tender, non-distended MUSCULOSKELETAL:  No edema; No deformity  SKIN: Warm and dry NEUROLOGIC:  Alert and oriented x 3 PSYCHIATRIC:  Normal affect   ASSESSMENT:    1. Coronary artery disease involving native coronary artery of native heart without angina pectoris   2. S/P CABG (coronary artery bypass graft)   3. Palpitations   4. Kidney function abnormal   5. Stage 3a chronic kidney disease    PLAN:    In order of  problems listed above:  CAD post CABG -Fairly stable.  Atypical chest pain.  Some shortness of breath with activity.  Dr. Darcey Nora was going to order a stress test.  I do not see completion of this as of yet.  Chronic kidney disease stage III -Creatinine 1.94 at last check prior to CT scan.  Avoid NSAIDs.  AAA repair -Stable.  Dr. Trula Slade.  Penetrating aortic ulcer -Currently stable asymptomatic.  Dr. Darcey Nora has been monitoring yearly.  Palpitations -Event monitor reviewed once again from 01/15/2020-occasional pauses 3.1 to 3.8 seconds during daytime hours.  He has been seen by EP, Dr. Caryl Comes.  This was felt to be probable vagal mediated, no need for pacemaker.  No obvious atrial fibrillation detected.  All pauses seem to be associated with PtoP prolongation and concomitant AV block.  He does nap at times during the day.  Postop atrial fibrillation -No further recurrence.  He is off of amiodarone as well as Eliquis.  Dr. Caryl Comes agrees.   Medication Adjustments/Labs and Tests Ordered: Current medicines are reviewed at length with the patient today.  Concerns regarding medicines are outlined above.  No orders of the defined types were placed in this encounter.  No orders of the defined types were placed in this encounter.   Patient Instructions  Medication Instructions:  The current medical regimen is effective;  continue present plan and medications.  *If you need a refill on your cardiac medications before your next appointment, please call your pharmacy*  Follow-Up: At [facility], you and your health needs are our priority.  As part of our continuing mission to provide you with exceptional heart care, we have created designated Provider Care Teams.  These Care Teams include your primary Cardiologist (physician) and Advanced Practice Providers (APPs -  Physician Assistants and  Nurse Practitioners) who all work together to provide you with the care you need, when you need  it.  We recommend signing up for the patient portal called "MyChart".  Sign up information is provided on this After Visit Summary.  MyChart is used to connect with patients for Virtual Visits (Telemedicine).  Patients are able to view lab/test results, encounter notes, upcoming appointments, etc.  Non-urgent messages can be sent to your provider as well.   To learn more about what you can do with MyChart, go to [email or web address].    Your next appointment:   6 month(s)  The format for your next appointment:   In Person  Provider:   Candee Furbish, MD   Thank you for choosing [facility]!!        Signed, Candee Furbish, MD  03/10/2020 1:08 PM    [place]

## 2020-03-09 NOTE — Patient Instructions (Signed)

## 2020-03-09 NOTE — Progress Notes (Signed)
Cardiology Office Note:    Date:  03/09/2020   ID:  Garrett Meek Sr., DOB Jul 19, 1941, MRN [id number]  PCP:  Antony Contras, MD  Cardiologist:  Candee Furbish, MD  Electrophysiologist:  None   Referring MD: Antony Contras, MD     History of Present Illness:    Garrett Welles. is a 79 y.o. male here for follow-up of coronary artery disease.    CABG 2010-Dr. Vantrigt Abdominal aortic aneurysm stent graft-Dr. 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  Has noticed some decreased exercise tolerance over the last several months.  Has had some atypical sharp type pain and vibrations in his chest.  CATH 2016: 1. Severe native three-vessel coronary artery disease 2.  Occlusion of the saphenous vein graft to very small posterior descending artery with stenosis in the right coronary artery graft prior to that in the place of a previous stent, the other grafts are widely patent 3.  Preserved LV function with inferior hypokinesis 4.  Moderately severe stenosis at the distal portion of a small graft to the right ventricular branch  The catheterization above was done after he was having some panic attack like symptoms.  Overall he still feeling some decreased energy.  Some vibrations-showed him the event monitor certainly could be a PVC or brief PAT.  Past Medical History:  Diagnosis Date  . AAA (abdominal aortic aneurysm) ([place])   . Arthritis    knees  . Atrial fibrillation ([place])   . CAD (coronary artery disease)   . Cancer [facility])    prostate  . Diabetes mellitus without complication ([place])   . GERD (gastroesophageal reflux disease)   . Gout   . Hyperlipidemia   . Hypertension   . Hypothyroidism   . Kidney stone    right kidney  . Myocardial infarction ([place]) 01/1991   sp inferior  . Nerve compression    right leg  . Reflux   . Seasonal allergies   . Thyroid disease    hypothyroidism    Past Surgical History:   Procedure Laterality Date  . ABDOMINAL AORTAGRAM N/A 02/28/2012   Procedure: ABDOMINAL Maxcine Ham;  Surgeon: Serafina Mitchell, MD;  Location: [facility] CATH LAB;  Service: Cardiovascular;  Laterality: N/A;  . abdominal aortagram embolization  02/28/2012  . ABDOMINAL AORTIC ANEURYSM REPAIR  Sept. 2013  . CORONARY ARTERY BYPASS GRAFT  04/2009  . [place] and  2009   RCA  . EMBOLIZATION Right 02/28/2012   Procedure: EMBOLIZATION;  Surgeon: Serafina Mitchell, MD;  Location: [facility] CATH LAB;  Service: Cardiovascular;  Laterality: Right;  . IR GENERIC HISTORICAL  12/05/2016   IR RADIOLOGIST EVAL & MGMT 12/05/2016 Jacqulynn Cadet, MD [facility] INTERV RAD  . IR RADIOLOGIST EVAL & MGMT  07/10/2019  . KIDNEY STONE SURGERY    . LEFT HEART CATHETERIZATION WITH CORONARY/GRAFT ANGIOGRAM N/A 01/08/2015   Procedure: LEFT HEART CATHETERIZATION WITH Beatrix Fetters;  Surgeon: Jacolyn Reedy, MD;  Location: [facility] CATH LAB;  Service: Cardiovascular;  Laterality: N/A;  . parathyroid adenoma    . PROSTATE SURGERY     rad seeds  . SPINE SURGERY    . TONSILLECTOMY      Current Medications: Current Meds  Medication Sig  . allopurinol (ZYLOPRIM) 300 MG tablet Take 300 mg by mouth every other day. Take in the evening - on even days of the month  . amLODipine (NORVASC) 10 MG tablet Take 10 mg  by mouth daily.  . Cholecalciferol (VITAMIN D3) 2000 UNITS TABS Take 2,000 Units by mouth daily.   . clonazePAM (KLONOPIN) 1 MG tablet Take 0.5 mg by mouth at bedtime.   . clopidogrel (PLAVIX) 75 MG tablet Take 1 tablet (75 mg total) by mouth daily.  . Coenzyme Q10 (EQL COQ10) 300 MG CAPS Take by mouth.  . esomeprazole (NEXIUM) 20 MG capsule Take 20 mg by mouth daily at 12 noon.  . fluorouracil (EFUDEX) 5 % cream Use as directed  . fluticasone (FLONASE) 50 MCG/ACT nasal spray Place 2 sprays into both nostrils daily as needed for allergies.   Marland Kitchen ibuprofen (ADVIL,MOTRIN) 200 MG tablet Take 200 mg by mouth as needed.   Marland Kitchen JANUVIA 50 MG tablet Take 50 mg by mouth daily.  Marland Kitchen levothyroxine (SYNTHROID, LEVOTHROID) 175 MCG tablet Take 175 mcg by mouth daily before breakfast.  . loratadine-pseudoephedrine (CLARITIN-D 24-HOUR) 10-240 MG per 24 hr tablet Take 1 tablet by mouth at bedtime.   . metoprolol tartrate (LOPRESSOR) 25 MG tablet Take 1 tablet (25 mg total) by mouth 2 (two) times daily.  . Multiple Vitamin (MULTIVITAMIN WITH MINERALS) TABS Take 1 tablet by mouth at bedtime.   . Multiple Vitamins-Minerals (PRESERVISION AREDS 2) CAPS Take 1 capsule by mouth 2 (two) times daily.  Marland Kitchen olmesartan-hydrochlorothiazide (BENICAR HCT) 40-25 MG tablet Take 1 tablet by mouth daily.  . rosuvastatin (CRESTOR) 10 MG tablet Take 10 mg by mouth as directed. Per patient, takes Mondays & Thursdays  . spironolactone (ALDACTONE) 25 MG tablet Take 1 tablet (25 mg total) by mouth daily.  . Tamsulosin HCl (FLOMAX) 0.4 MG CAPS Take 0.4 mg by mouth every other day. Take in the evening - on even days of the month  . triamcinolone cream (KENALOG) 0.1 % Apply 1 application topically as needed.  Marland Kitchen VASCEPA 1 g capsule Take 2 g by mouth 2 (two) times daily.  . vitamin B-12 (CYANOCOBALAMIN) 1000 MCG tablet Take 2,500 mcg by mouth at bedtime.      Allergies:   Ace inhibitors, Crestor [rosuvastatin], Nitroglycerin, and Quinolones   Social History   Socioeconomic History  . Marital status: Divorced    Spouse name: Not on file  . Number of children: Not on file  . Years of education: Not on file  . Highest education level: Not on file  Occupational History  . Not on file  Tobacco Use  . Smoking status: Former Smoker    Types: Cigarettes    Quit date: 08/01/1970    Years since quitting: 49.6  . Smokeless tobacco: Never Used  Substance and Sexual Activity  . Alcohol use: Yes    Alcohol/week: 6.0 - 9.0 standard drinks    Types: 4 - 5 Glasses of wine, 2 - 4 Cans of beer per week    Comment: 1-3 glasses 3-4 times a week  . Drug use: No   . Sexual activity: Not on file  Other Topics Concern  . Not on file  Social History Narrative  . Not on file   Social Determinants of Health   Financial Resource Strain:   . Difficulty of Paying Living Expenses:   Food Insecurity:   . Worried About Charity fundraiser in the Last Year:   . Arboriculturist in the Last Year:   Transportation Needs:   . Film/video editor (Medical):   Marland Kitchen Lack of Transportation (Non-Medical):   Physical Activity:   . Days of Exercise per Week:   .  Minutes of Exercise per Session:   Stress:   . Feeling of Stress :   Social Connections:   . Frequency of Communication with Friends and Family:   . Frequency of Social Gatherings with Friends and Family:   . Attends Religious Services:   . Active Member of Clubs or Organizations:   . Attends Archivist Meetings:   Marland Kitchen Marital Status:      Family History: The patient's family history includes Deep vein thrombosis in his mother; Diabetes in his maternal grandmother, sister, and sister; Heart attack in his father and sister; Heart disease in his father, paternal uncle, and sister; Hyperlipidemia in his father and sister; Hypertension in his father and sister; Stroke in his mother.  ROS:   Please see the history of present illness.     All other systems reviewed and are negative.  EKGs/Labs/Other Studies Reviewed:    The following studies were reviewed today:  Echo 2019-EF 50%  EKG:  EKG is not ordered today.    Recent Labs: 02/13/2020: BUN 35; Creatinine, Ser 1.94; Potassium 4.1; Sodium 138  Recent Lipid Panel    Component Value Date/Time   CHOL 195 01/08/2015 0404   TRIG 318 (H) 01/08/2015 0404   HDL 29 (L) 01/08/2015 0404   CHOLHDL 6.7 01/08/2015 0404   VLDL 64 (H) 01/08/2015 0404   LDLCALC 102 (H) 01/08/2015 0404    Physical Exam:    VS:  BP 132/74   Pulse 61   Ht 5\' 10"  (1.778 m)   Wt 234 lb (106.1 kg)   SpO2 97%   BMI 33.58 kg/m     Wt Readings from Last 3  Encounters:  03/09/20 234 lb (106.1 kg)  03/03/20 200 lb (90.7 kg)  01/21/20 201 lb (91.2 kg)     GEN:  Well nourished, well developed in no acute distress HEENT: Normal NECK: No JVD; No carotid bruits LYMPHATICS: No lymphadenopathy CARDIAC: RRR, no murmurs, rubs, gallops RESPIRATORY:  Clear to auscultation without rales, wheezing or rhonchi  ABDOMEN: Soft, non-tender, non-distended MUSCULOSKELETAL:  No edema; No deformity  SKIN: Warm and dry NEUROLOGIC:  Alert and oriented x 3 PSYCHIATRIC:  Normal affect   ASSESSMENT:    1. Coronary artery disease involving native coronary artery of native heart without angina pectoris   2. S/P CABG (coronary artery bypass graft)   3. Palpitations   4. Kidney function abnormal   5. Stage 3a chronic kidney disease    PLAN:    In order of problems listed above:  CAD post CABG -Fairly stable.  Still having decreased energy.  Feels a little bit better that the weather has changed.  Atypical chest pain.  Some shortness of breath with activity.  Dr. Darcey Nora has ordered stress test.  This will be performed tomorrow.  Prior catheterization in 2016 showed only 1 bypass graft to PDA was down.  This was a fairly small vessel.  Chronic kidney disease stage III -Creatinine 1.94 at last check prior to CT scan.  Avoid NSAIDs.  AAA repair -Stable.  Dr. Trula Slade.  I have encouraged him to call the office to obtain follow-up.  Penetrating aortic ulcer -Currently stable asymptomatic.  Dr. Darcey Nora has been monitoring yearly.  Palpitations -Event monitor reviewed once again from 01/15/2020-occasional pauses 3.1 to 3.8 seconds during daytime hours.  He has been seen by EP, Dr. Caryl Comes.  This was felt to be probable vagal mediated, no need for pacemaker.  No obvious atrial fibrillation detected.  All pauses  seem to be associated with PDP prolongation and concomitant AV block.  He does nap at times during the day.  Postop atrial fibrillation -No further  recurrence.  He is off of amiodarone as well as Eliquis.  Dr. Caryl Comes agrees.   Medication Adjustments/Labs and Tests Ordered: Current medicines are reviewed at length with the patient today.  Concerns regarding medicines are outlined above.  No orders of the defined types were placed in this encounter.  No orders of the defined types were placed in this encounter.   Patient Instructions  Medication Instructions:  The current medical regimen is effective;  continue present plan and medications.  *If you need a refill on your cardiac medications before your next appointment, please call your pharmacy*  Follow-Up: At [facility], you and your health needs are our priority.  As part of our continuing mission to provide you with exceptional heart care, we have created designated Provider Care Teams.  These Care Teams include your primary Cardiologist (physician) and Advanced Practice Providers (APPs -  Physician Assistants and Nurse Practitioners) who all work together to provide you with the care you need, when you need it.  We recommend signing up for the patient portal called "MyChart".  Sign up information is provided on this After Visit Summary.  MyChart is used to connect with patients for Virtual Visits (Telemedicine).  Patients are able to view lab/test results, encounter notes, upcoming appointments, etc.  Non-urgent messages can be sent to your provider as well.   To learn more about what you can do with MyChart, go to [email or web address].    Your next appointment:   6 month(s)  The format for your next appointment:   In Person  Provider:   Candee Furbish, MD   Thank you for choosing [facility]!!        Signed, Candee Furbish, MD  03/09/2020 9:32 AM    Garrett Jordan

## 2020-03-10 ENCOUNTER — Ambulatory Visit (HOSPITAL_COMMUNITY): Payer: Medicare Other | Attending: Cardiovascular Disease

## 2020-03-10 VITALS — Ht 70.0 in | Wt 200.0 lb

## 2020-03-10 DIAGNOSIS — R079 Chest pain, unspecified: Secondary | ICD-10-CM | POA: Diagnosis present

## 2020-03-10 DIAGNOSIS — I2510 Atherosclerotic heart disease of native coronary artery without angina pectoris: Secondary | ICD-10-CM

## 2020-03-10 DIAGNOSIS — I480 Paroxysmal atrial fibrillation: Secondary | ICD-10-CM | POA: Diagnosis present

## 2020-03-10 LAB — MYOCARDIAL PERFUSION IMAGING
LV dias vol: 115 mL (ref 62–150)
LV sys vol: 58 mL
Peak HR: 72 {beats}/min
Rest HR: 57 {beats}/min
SDS: 1
SRS: 0
SSS: 1
TID: 1.04

## 2020-03-10 MED ORDER — TECHNETIUM TC 99M TETROFOSMIN IV KIT
33.0000 | PACK | Freq: Once | INTRAVENOUS | Status: AC | PRN
  Administered 2020-03-10: 13:00:00 33 via INTRAVENOUS
  Filled 2020-03-10: qty 33

## 2020-03-10 MED ORDER — REGADENOSON 0.4 MG/5ML IV SOLN
0.4000 mg | Freq: Once | INTRAVENOUS | Status: AC
  Administered 2020-03-10: 13:00:00 0.4 mg via INTRAVENOUS

## 2020-03-10 MED ORDER — TECHNETIUM TC 99M TETROFOSMIN IV KIT
10.2000 | PACK | Freq: Once | INTRAVENOUS | Status: AC | PRN
  Administered 2020-03-10: 11:00:00 10.2 via INTRAVENOUS
  Filled 2020-03-10: qty 11

## 2020-03-15 ENCOUNTER — Other Ambulatory Visit: Payer: Self-pay

## 2020-03-17 ENCOUNTER — Ambulatory Visit: Payer: Medicare Other | Admitting: Internal Medicine

## 2020-03-17 ENCOUNTER — Encounter: Payer: Self-pay | Admitting: Internal Medicine

## 2020-03-17 ENCOUNTER — Other Ambulatory Visit: Payer: Self-pay

## 2020-03-17 ENCOUNTER — Other Ambulatory Visit (INDEPENDENT_AMBULATORY_CARE_PROVIDER_SITE_OTHER): Payer: Medicare Other

## 2020-03-17 VITALS — BP 158/88 | HR 56 | Temp 98.8°F | Ht 70.0 in | Wt 197.8 lb

## 2020-03-17 DIAGNOSIS — R739 Hyperglycemia, unspecified: Secondary | ICD-10-CM | POA: Diagnosis not present

## 2020-03-17 DIAGNOSIS — E781 Pure hyperglyceridemia: Secondary | ICD-10-CM

## 2020-03-17 DIAGNOSIS — E1165 Type 2 diabetes mellitus with hyperglycemia: Secondary | ICD-10-CM

## 2020-03-17 DIAGNOSIS — Z794 Long term (current) use of insulin: Secondary | ICD-10-CM

## 2020-03-17 LAB — BASIC METABOLIC PANEL
BUN: 31 mg/dL — ABNORMAL HIGH (ref 6–23)
CO2: 27 mEq/L (ref 19–32)
Calcium: 10.4 mg/dL (ref 8.4–10.5)
Chloride: 102 mEq/L (ref 96–112)
Creatinine, Ser: 1.52 mg/dL — ABNORMAL HIGH (ref 0.40–1.50)
GFR: 44.62 mL/min — ABNORMAL LOW (ref >60.00)
Glucose, Bld: 146 mg/dL — ABNORMAL HIGH (ref 70–99)
Potassium: 4 mEq/L (ref 3.5–5.1)
Sodium: 134 mEq/L — ABNORMAL LOW (ref 135–145)

## 2020-03-17 LAB — POCT GLYCOSYLATED HEMOGLOBIN (HGB A1C): Hemoglobin A1C: 6.5 % — AB (ref 4.0–5.6)

## 2020-03-17 LAB — ALBUMIN: Albumin: 4.5 g/dL (ref 3.5–5.2)

## 2020-03-17 MED ORDER — GLIPIZIDE 5 MG PO TABS: 2.5000 mg | ORAL_TABLET | Freq: Every day | ORAL | 1 refills | Status: DC

## 2020-03-17 NOTE — Progress Notes (Signed)
Name: Garrett CATTERTON Sr.  MRN/ DOB: [id number], 1941/08/25   Age/ Sex: 79 y.o., male    PCP: Antony Contras, MD   Reason for Endocrinology Evaluation: Type 2 Diabetes Mellitus     Date of Initial Endocrinology Visit: 03/17/2020     PATIENT IDENTIFIER: Garrett Jordan. is a 79 y.o. male with a past medical history of T2DM, RLS, CAD and CKD. The patient presented for initial endocrinology clinic visit on 03/17/2020 for consultative assistance with his diabetes management.    HPI: [name] was    Diagnosed with DM in 2019 Prior Medications tried/Intolerance: Invokana- cost prohibitive Currently checking blood sugars 1 x / day,  before breakfast  Hypoglycemia episodes : no                 Hemoglobin A1c has ranged from 6.7% in 2020, peaking at 7.8 % in 2021. Patient required assistance for hypoglycemia: no  Patient has required hospitalization within the last 1 year from hyper or hypoglycemia: no   In terms of diet, the patient eats 3 meals a day , he started cutting carbs in 12/2019 Snacks mid-morning   HYPERCALCEMIA HISTORY: Mr. Wadel indicates that he was first noted with hypercalcemia in 2020. Since that time, he has not experienced symptoms of constipation, polyuria, polydipsia, has generalized weakness, but no diffuse muscle pains just left neck pain,denies  significant memory impairment. He uses over the counter calcium  (MVI),but no prior lithium use.  He is on HCTZ  He is on  vitamin D 1000 iu daily    He has  History of  kidney stones, and CKD but no liver disease,or  granulomatous disease. He denies osteoporosis or prior fractures. Daily dietary calcium intake: 1 servings. He denies  family history of osteoporosis, or thyroid disease. Son with kidney stones    Pt is S/P left parathyroidectomy in 1970's   HOME DIABETES REGIMEN: Januvia 50 mg daily Invokana 100 mg daily - not taking was given samples     Statin:yes ACE-I/ARB: yes Prior Diabetic Education: no    METER DOWNLOAD SUMMARY: Date range evaluated: 4/6-03/17/2020 Fingerstick Blood Glucose Tests = 29 Average Number Tests/Day = 1 Overall Mean FS Glucose = 154 Standard Deviation = 14.9  BG Ranges: Low = 133 High = 193   Hypoglycemic Events/30 Days: BG < 50 = 0 Episodes of symptomatic severe hypoglycemia = 0   DIABETIC COMPLICATIONS: Microvascular complications:   CKD  Denies: retinopathy, neuropathy   Last eye exam: Completed 01/2020  Macrovascular complications:   CAD  Denies: PVD, CVA   PAST HISTORY: Past Medical History:  Past Medical History:  Diagnosis Date  . AAA (abdominal aortic aneurysm) ([place])   . Arthritis    knees  . Atrial fibrillation ([place])   . CAD (coronary artery disease)   . Cancer [facility])    prostate  . Diabetes mellitus without complication ([place])   . GERD (gastroesophageal reflux disease)   . Gout   . Hyperlipidemia   . Hypertension   . Hypothyroidism   . Kidney stone    right kidney  . Myocardial infarction ([place]) 01/1991   sp inferior  . Nerve compression    right leg  . Reflux   . Seasonal allergies   . Thyroid disease    hypothyroidism   Past Surgical History:  Past Surgical History:  Procedure Laterality Date  . ABDOMINAL AORTAGRAM N/A 02/28/2012   Procedure: ABDOMINAL Maxcine Ham;  Surgeon: Serafina Mitchell, MD;  Location: [facility] CATH LAB;  Service: Cardiovascular;  Laterality: N/A;  . abdominal aortagram embolization  02/28/2012  . ABDOMINAL AORTIC ANEURYSM REPAIR  Sept. 2013  . CORONARY ARTERY BYPASS GRAFT  04/2009  . [place] and  2009   RCA  . EMBOLIZATION Right 02/28/2012   Procedure: EMBOLIZATION;  Surgeon: Serafina Mitchell, MD;  [facility] C[facility];  Service: Cardiovascular;  Laterality: Right;  . IR GENERIC HISTORICAL  12/05/2016   IR RADIOLOGIST EVAL & MGMT 12/05/2016 Jacqulynn Cadet, MD [facility] INTERV RAD  . IR RADIOLOGIST EVAL & MGMT  07/10/2019  . KIDNEY STONE SURGERY    . LEFT HEART CATHETERIZATION  WITH CORONARY/GRAFT ANGIOGRAM N/A 01/08/2015   Procedure: LEFT HEART CATHETERIZATION WITH Beatrix Fetters;  Surgeon: Jacolyn Reedy, MD;  Location: [facility] C[facility] LAB;  Service: Cardiovascular;  Laterality: N/A;  . parathyroid adenoma    . PROSTATE SURGERY     rad seeds  . SPINE SURGERY    . TONSILLECTOMY        Social History:  reports that he quit smoking about 49 years ago. His smoking use included cigarettes. He has never used smokeless tobacco. He reports current alcohol use of about 6.0 - 9.0 standard drinks of alcohol per week. He reports that he does not use drugs. Family History:  Family History  Problem Relation Age of Onset  . Heart disease Father        Heart Disease before age 39  . Heart attack Father   . Hyperlipidemia Father   . Hypertension Father   . Stroke Mother   . Deep vein thrombosis Mother   . Heart disease Sister   . Diabetes Sister   . Hyperlipidemia Sister   . Hypertension Sister   . Heart attack Sister   . Heart disease Paternal Uncle   . Diabetes Maternal Grandmother   . Diabetes Sister      HOME MEDICATIONS: Allergies as of 03/17/2020      Reactions   Ace Inhibitors Cough   Crestor [rosuvastatin] Other (See Comments)   Muscle aches   Nitroglycerin Other (See Comments)   Very pronounced lowered BP with IV nitro for caths   Quinolones    Aortic root enlargement      Medication List       Accurate as of Mar 17, 2020  9:53 AM. If you have any questions, ask your nurse or doctor.        allopurinol 300 MG tablet Commonly known as: ZYLOPRIM Take 300 mg by mouth every other day. Take in the evening - on even days of the month   amLODipine 10 MG tablet Commonly known as: NORVASC Take 10 mg by mouth daily.   clonazePAM 1 MG tablet Commonly known as: KLONOPIN Take 0.5 mg by mouth at bedtime.   clopidogrel 75 MG tablet Commonly known as: PLAVIX Take 1 tablet (75 mg total) by mouth daily.   EQL CoQ10 300 MG Caps Generic drug:  Coenzyme Q10 Take by mouth.   esomeprazole 20 MG capsule Commonly known as: NEXIUM Take 20 mg by mouth daily at 12 noon.   fluorouracil 5 % cream Commonly known as: EFUDEX Use as directed   fluticasone 50 MCG/ACT nasal spray Commonly known as: FLONASE Place 2 sprays into both nostrils daily as needed for allergies.   ibuprofen 200 MG tablet Commonly known as: ADVIL Take 200 mg by mouth as needed.   Januvia 50 MG tablet Generic drug: sitaGLIPtin Take 50  mg by mouth daily.   levothyroxine 175 MCG tablet Commonly known as: SYNTHROID Take 175 mcg by mouth daily before breakfast.   loratadine-pseudoephedrine 10-240 MG 24 hr tablet Commonly known as: CLARITIN-D 24-hour Take 1 tablet by mouth at bedtime.   metoprolol tartrate 25 MG tablet Commonly known as: LOPRESSOR Take 1 tablet (25 mg total) by mouth 2 (two) times daily.   multivitamin with minerals Tabs tablet Take 1 tablet by mouth at bedtime.   olmesartan-hydrochlorothiazide 40-25 MG tablet Commonly known as: BENICAR HCT Take 1 tablet by mouth daily.   PreserVision AREDS 2 Caps Take 1 capsule by mouth 2 (two) times daily.   rosuvastatin 10 MG tablet Commonly known as: CRESTOR Take 10 mg by mouth as directed. Per patient, takes Mondays & Thursdays   spironolactone 25 MG tablet Commonly known as: ALDACTONE Take 1 tablet (25 mg total) by mouth daily.   tamsulosin 0.4 MG Caps capsule Commonly known as: FLOMAX Take 0.4 mg by mouth every other day. Take in the evening - on even days of the month   triamcinolone cream 0.1 % Commonly known as: KENALOG Apply 1 application topically as needed.   Vascepa 1 g capsule Generic drug: icosapent Ethyl Take 2 g by mouth 2 (two) times daily.   vitamin B-12 1000 MCG tablet Commonly known as: CYANOCOBALAMIN Take 2,500 mcg by mouth at bedtime.   Vitamin D3 50 MCG (2000 UT) Tabs Take 2,000 Units by mouth daily.        ALLERGIES: Allergies  Allergen Reactions  .  Ace Inhibitors Cough  . Crestor [Rosuvastatin] Other (See Comments)    Muscle aches  . Nitroglycerin Other (See Comments)    Very pronounced lowered BP with IV nitro for caths  . Quinolones     Aortic root enlargement     REVIEW OF SYSTEMS: A comprehensive ROS was conducted with the patient and is negative except as per HPI and below:  Review of Systems  Gastrointestinal: Negative for diarrhea and nausea.  Genitourinary: Negative for frequency.  Neurological: Positive for tingling.       Burning at the top of the right foot   Endo/Heme/Allergies: Negative for polydipsia.      OBJECTIVE:   VITAL SIGNS: BP (!) 158/88 (BP Location: Left Arm, Patient Position: Sitting, Cuff Size: Normal)   Pulse (!) 56   Temp 98.8 F (37.1 C)   Ht 5\' 10"  (1.778 m)   Wt 197 lb 12.8 oz (89.7 kg)   SpO2 97%   BMI 28.38 kg/m    PHYSICAL EXAM:  General: Pt appears well and is in NAD  HEENT:  Eyes: External eye exam normal without stare, lid lag or exophthalmos.  EOM intact.   Neck: General: Supple without adenopathy or carotid bruits. Thyroid: Thyroid size normal.  No goiter or nodules appreciated.  Lungs: Clear with good BS bilat with no rales, rhonchi, or wheezes  Heart: RRR with normal S1 and S2 and no gallops; no murmurs; no rub  Abdomen: Normoactive bowel sounds, soft, nontender, without masses or organomegaly palpable  Extremities:  Lower extremities - No pretibial edema. No lesions.  Skin: Normal texture and temperature to palpation.  Neuro: MS is good with appropriate affect, pt is alert and Ox3    DM foot exam: 03/17/2020  The skin of the feet is intact without sores or ulcerations. The pedal pulses are 2+ on right and 2+ on left. The sensation is intact to a screening 5.07, 10 gram monofilament bilaterally  DATA REVIEWED: 12/17/2019  A1c 7.8%  BUN/Cr 31/1.68 GFR 40 Calcium 10.9 (corrected calcium 10.41) Tg 946  HDL 25 LDL 52 Vitamin D 37.4      Results for BREYER, TEJERA SR. "TOM" (MRN [id number]) as of 03/18/2020 13:47  Ref. Range 03/17/2020 10:56 03/17/2020 10:58  Sodium Latest Ref Range: 135 - 145 mEq/L 134 (L)   Potassium Latest Ref Range: 3.5 - 5.1 mEq/L 4.0   Chloride Latest Ref Range: 96 - 112 mEq/L 102   CO2 Latest Ref Range: 19 - 32 mEq/L 27   Glucose Latest Ref Range: 70 - 99 mg/dL 146 (H)   BUN Latest Ref Range: 6 - 23 mg/dL 31 (H)   Creatinine Latest Ref Range: 0.40 - 1.50 mg/dL 1.52 (H)   Calcium Latest Ref Range: 8.4 - 10.5 mg/dL 10.4   Albumin Latest Ref Range: 3.5 - 5.2 g/dL 4.5   GFR Latest Ref Range: >60.00 mL/min 44.62 (L)   PTH, Intact Latest Ref Range: 14 - 64 pg/mL  101 (H)    ASSESSMENT / PLAN / RECOMMENDATIONS:   1) Type 2 Diabetes Mellitus, Optimally controlled, With CKD III complications - Most recent A1c of 6.5 %. Goal A1c < 7.0 %.    Plan: GENERAL: I have discussed with the patient the pathophysiology of diabetes. We went over the natural progression of the disease. We talked about both insulin resistance and insulin deficiency. We stressed the importance of lifestyle changes including diet and exercise. I explained the complications associated with diabetes including retinopathy, nephropathy, neuropathy as well as increased risk of cardiovascular disease. We went over the benefit seen with glycemic control.    I explained to the patient that diabetic patients are at higher than normal risk for amputations.   His A1c has improved due to drastic reduction on carbohydrate intake that was started in 12/2019 but I have advised him against this , as drastically reducing his CHO intake will results in more protein intake which could affect his CKD. I have encouraged him to re-introduce carbohydrates with each meal but in moderation.   We discussed our limited oral glycemic agents due to CKD   SGLT-2 inhibitors are cost prohibitive   We discussed starting sulfonylurea, we discussed risk of hypoglycemia, we also discussed his increased  risk of pancreatitis in the setting of hypertriglyceridemia and DPP-4 inhibitor intake, I prefer to take him off of this if Tg remain > 500.    MEDICATIONS:  Continue Januvia 50 mg daily   Start Glipizide 5 mg, HALF a tablet before Breakfast   EDUCATION / INSTRUCTIONS:  BG monitoring instructions: Patient is instructed to check his blood sugars 1 times a day, fasting.  Call [facility] if: BG persistently < 70 . Marland Kitchen I reviewed the Rule of 15 for the treatment of hypoglycemia in detail with the patient. Literature supplied.   2) Diabetic complications:   Eye: Does not have known diabetic retinopathy.   Neuro/ Feet: Does not have known diabetic peripheral neuropathy.  Renal: Patient does  have known baseline CKD. He is  on an ACEI/ARB at present.Check urine albumin/creatinine ratio yearly starting at time of diagnosis. If albuminuria is positive, treatment is geared toward better glucose, blood pressure control and use of ACE inhibitors or ARBs. Monitor electrolytes and creatinine once to twice yearly.   3) Hypertriglyceridemia: Patient is on rosuvastatin twice a week due to myalgia. Started Vascepa in the past 4 months. We discussed the increased risk of pancreatitis with  Tg > 500 mg/dL    4) Hypercalcemia:  - Pt with hx of parathyroidectomy in 1970's due to hypercalcemia and recurrent renal stones - His Vitamin D is normal through PCP's labs - I am going to proceed with 24- hr urine collection, ideally this would be without HCTZ on board, but I would like to proceed with this now and not having to wait another month - Most likely he has primary hyperparathyroidism  - He was encouraged to remain hydrated, avoid OTC calcium supplements but maintain 2-3 servings of calcium in the diet.  - PTH elevate but serum calcium is normal on today's labs  (corrected 10 )    F/U in 3 months    Signed electronically by: Mack Guise, MD  [facility]   [facility] [address], [place], [place] [address] Phone: [phone] FAX: [phone]   CC: Antony Contras, [place] [address] Phone: [phone]  Fax: [phone]    Return to Endocrinology clinic as below: Future Appointments  Date Time Provider [place]  03/24/2020 12:30 PM Ivin Poot, MD [facility]

## 2020-03-17 NOTE — Patient Instructions (Addendum)
-   Continue Januvia 50 mg daily  - Start Glipizide 5 mg , HALF a tablet before first meal     HOW TO TREAT LOW BLOOD SUGARS (Blood sugar LESS THAN 70 MG/DL)  Please follow the RULE OF 15 for the treatment of hypoglycemia treatment (when your (blood sugars are less than 70 mg/dL)    STEP 1: Take 15 grams of carbohydrates when your blood sugar is low, which includes:   3-4 GLUCOSE TABS  OR  3-4 OZ OF JUICE OR REGULAR SODA OR  ONE TUBE OF GLUCOSE GEL     STEP 2: RECHECK blood sugar in 15 MINUTES STEP 3: If your blood sugar is still low at the 15 minute recheck --> then, go back to STEP 1 and treat AGAIN with another 15 grams of carbohydrates.  - Stay hydrated  - AVOID CALCIUM SUPPLEMENTS, AVOID LOW CALCIUM DIET - Maintain normal dietary calcium intake (2-3 servings of dairy a day)    24-Hour Urine Collection   You will be collecting your urine for a 24-hour period of time.  Your timer starts with your first urine of the morning (For example - If you first pee at [place], your timer will start at [place])  [place] away your first urine of the morning  Collect your urine every time you pee for the next 24 hours STOP your urine collection 24 hours after you started the collection (For example - You would stop at 9AM the day after you started)

## 2020-03-18 ENCOUNTER — Encounter: Payer: Self-pay | Admitting: Internal Medicine

## 2020-03-18 LAB — PARATHYROID HORMONE, INTACT (NO CA): PTH: 101 pg/mL — ABNORMAL HIGH (ref 14–64)

## 2020-03-19 ENCOUNTER — Other Ambulatory Visit: Payer: Medicare Other

## 2020-03-20 LAB — CREATININE, URINE, RANDOM: Creatinine, Urine: 92 mg/dL (ref 20–320)

## 2020-03-20 LAB — CALCIUM, URINE, 24 HOUR: Calcium, 24H Urine: 132 mg/24 h

## 2020-03-20 LAB — EXTRA URINE SPECIMEN

## 2020-03-22 NOTE — Progress Notes (Unsigned)
0

## 2020-03-24 ENCOUNTER — Encounter: Payer: Self-pay | Admitting: Cardiothoracic Surgery

## 2020-03-24 ENCOUNTER — Ambulatory Visit: Payer: Medicare Other | Admitting: Cardiothoracic Surgery

## 2020-03-24 ENCOUNTER — Other Ambulatory Visit: Payer: Self-pay

## 2020-03-24 DIAGNOSIS — R002 Palpitations: Secondary | ICD-10-CM | POA: Diagnosis not present

## 2020-03-24 NOTE — Progress Notes (Signed)
PCP is Antony Contras, MD Referring Provider is Antony Contras, MD  Chief Complaint  Patient presents with  . Follow-up    discuss 03/10/20 myoview scan results  . Thoracic Aortic Aneurysm    HPI: The patient presents after a Myoview.  He has had some atypical chest pain-chest vibrations over the past couple months.  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 The Myoview scan shows EF 45%, similar to previous echocardiogram.  There is a small inferior scar but no evidence of ischemia.  It is a low risk study. I reviewed the results of the scan with the patient and reassured him that his brief 15-second vibrations sensation in his chest are probably due to ectopy, not serious.  Past Medical History:  Diagnosis Date  . AAA (abdominal aortic aneurysm) ([place])   . Arthritis    knees  . Atrial fibrillation ([place])   . CAD (coronary artery disease)   . Cancer [facility])    prostate  . Diabetes mellitus without complication ([place])   . GERD (gastroesophageal reflux disease)   . Gout   . Hyperlipidemia   . Hypertension   . Hypothyroidism   . Kidney stone    right kidney  . Myocardial infarction ([place]) 01/1991   sp inferior  . Nerve compression    right leg  . Reflux   . Seasonal allergies   . Thyroid disease    hypothyroidism    Past Surgical History:  Procedure Laterality Date  . ABDOMINAL AORTAGRAM N/A 02/28/2012   Procedure: ABDOMINAL Maxcine Ham;  Surgeon: Serafina Mitchell, MD;  Location: [facility] CATH LAB;  Service: Cardiovascular;  Laterality: N/A;  . abdominal aortagram embolization  02/28/2012  . ABDOMINAL AORTIC ANEURYSM REPAIR  Sept. 2013  . CORONARY ARTERY BYPASS GRAFT  04/2009  . [place] and  2009   RCA  . EMBOLIZATION Right 02/28/2012   Procedure: EMBOLIZATION;  Surgeon: Serafina Mitchell, MD;  Location: [facility] CATH LAB;  Service: Cardiovascular;  Laterality: Right;  . IR GENERIC HISTORICAL  12/05/2016   IR RADIOLOGIST EVAL & MGMT 12/05/2016 Jacqulynn Cadet, MD  [facility] INTERV RAD  . IR RADIOLOGIST EVAL & MGMT  07/10/2019  . KIDNEY STONE SURGERY    . LEFT HEART CATHETERIZATION WITH CORONARY/GRAFT ANGIOGRAM N/A 01/08/2015   Procedure: LEFT HEART CATHETERIZATION WITH Beatrix Fetters;  Surgeon: Jacolyn Reedy, MD;  Location: [facility] CATH LAB;  Service: Cardiovascular;  Laterality: N/A;  . parathyroid adenoma    . PROSTATE SURGERY     rad seeds  . SPINE SURGERY    . TONSILLECTOMY      Family History  Problem Relation Age of Onset  . Heart disease Father        Heart Disease before age 61  . Heart attack Father   . Hyperlipidemia Father   . Hypertension Father   . Stroke Mother   . Deep vein thrombosis Mother   . Heart disease Sister   . Diabetes Sister   . Hyperlipidemia Sister   . Hypertension Sister   . Heart attack Sister   . Heart disease Paternal Uncle   . Diabetes Maternal Grandmother   . Diabetes Sister     Social History Social History   Tobacco Use  . Smoking status: Former Smoker    Types: Cigarettes    Quit date: 08/01/1970    Years since quitting: 49.6  . Smokeless tobacco: Never Used  Substance Use Topics  . Alcohol use: Yes  Alcohol/week: 6.0 - 9.0 standard drinks    Types: 4 - 5 Glasses of wine, 2 - 4 Cans of beer per week    Comment: 1-3 glasses 3-4 times a week  . Drug use: No    Current Outpatient Medications  Medication Sig Dispense Refill  . allopurinol (ZYLOPRIM) 300 MG tablet Take 300 mg by mouth every other day. Take in the evening - on even days of the month    . amLODipine (NORVASC) 10 MG tablet Take 10 mg by mouth daily.    . Blood Glucose Calibration (ACCU-CHEK AVIVA VI) by In Vitro route.    . Cholecalciferol (VITAMIN D3) 2000 UNITS TABS Take 2,000 Units by mouth daily.     . clonazePAM (KLONOPIN) 1 MG tablet Take 0.5 mg by mouth at bedtime.     . clopidogrel (PLAVIX) 75 MG tablet Take 1 tablet (75 mg total) by mouth daily. 90 tablet 1  . Coenzyme Q10 (EQL COQ10) 300 MG CAPS Take by mouth.     . esomeprazole (NEXIUM) 20 MG capsule Take 20 mg by mouth daily at 12 noon.    . fluorouracil (EFUDEX) 5 % cream Use as directed    . fluticasone (FLONASE) 50 MCG/ACT nasal spray Place 2 sprays into both nostrils daily as needed for allergies.     Marland Kitchen glipiZIDE (GLUCOTROL) 5 MG tablet Take 0.5 tablets (2.5 mg total) by mouth daily before breakfast. 45 tablet 1  . ibuprofen (ADVIL,MOTRIN) 200 MG tablet Take 200 mg by mouth as needed.    Marland Kitchen JANUVIA 50 MG tablet Take 50 mg by mouth daily.  1  . levothyroxine (SYNTHROID, LEVOTHROID) 175 MCG tablet Take 175 mcg by mouth daily before breakfast.    . loratadine-pseudoephedrine (CLARITIN-D 24-HOUR) 10-240 MG per 24 hr tablet Take 1 tablet by mouth at bedtime.     . metoprolol tartrate (LOPRESSOR) 25 MG tablet Take 1 tablet (25 mg total) by mouth 2 (two) times daily. 180 tablet 3  . Multiple Vitamins-Minerals (PRESERVISION AREDS 2) CAPS Take 1 capsule by mouth 2 (two) times daily.    Marland Kitchen olmesartan-hydrochlorothiazide (BENICAR HCT) 40-25 MG tablet Take 1 tablet by mouth daily. 60 tablet 3  . rosuvastatin (CRESTOR) 10 MG tablet Take 10 mg by mouth as directed. Per patient, takes Mondays & Thursdays    . spironolactone (ALDACTONE) 25 MG tablet Take 1 tablet (25 mg total) by mouth daily. 90 tablet 3  . Tamsulosin HCl (FLOMAX) 0.4 MG CAPS Take 0.4 mg by mouth every other day. Take in the evening - on even days of the month    . triamcinolone cream (KENALOG) 0.1 % Apply 1 application topically as needed.  2  . VASCEPA 1 g capsule Take 2 g by mouth 2 (two) times daily.    . vitamin B-12 (CYANOCOBALAMIN) 1000 MCG tablet Take 2,500 mcg by mouth at bedtime.      No current facility-administered medications for this visit.    Allergies  Allergen Reactions  . Ace Inhibitors Cough  . Crestor [Rosuvastatin] Other (See Comments)    Muscle aches  . Nitroglycerin Other (See Comments)    Very pronounced lowered BP with IV nitro for caths  . Quinolones     Aortic  root enlargement    Review of Systems   No change since previous visit  BP 130/79 (BP Location: Right Arm)   Pulse (!) 54   Temp (!) 96.8 F (36 C) (Temporal)   Resp 20   Ht 5'  10" (1.778 m)   Wt 200 lb (90.7 kg)   SpO2 99% Comment: RA  BMI 28.70 kg/m  Physical Exam      Exam    General- alert and comfortable    Neck- no JVD, no cervical adenopathy palpable, no carotid bruit   Lungs- clear without rales, wheezes   Cor- regular rate and rhythm, no murmur , gallop   Abdomen- soft, non-tender   Extremities - warm, non-tender, minimal edema   Neuro- oriented, appropriate, no focal weakness   Diagnostic Tests: Myoview scan results reviewed with patient as reported above-low risk study  Impression: Status post CABG over 10 years ago with brief episodes of atypical chest pain-chest vibration sensation Myoview scan low risk for ischemia  Plan: Continue current medical therapy.  Return as scheduled   Len Childs, MD [facility] [phone]

## 2020-04-22 ENCOUNTER — Other Ambulatory Visit: Payer: Self-pay | Admitting: *Deleted

## 2020-04-22 MED ORDER — AMLODIPINE BESYLATE 10 MG PO TABS: 10.0000 mg | ORAL_TABLET | Freq: Every day | ORAL | 2 refills | Status: DC

## 2020-04-22 MED ORDER — CLOPIDOGREL BISULFATE 75 MG PO TABS: 75.0000 mg | ORAL_TABLET | Freq: Every day | ORAL | 0 refills | Status: DC

## 2020-04-22 MED ORDER — OLMESARTAN MEDOXOMIL-HCTZ 40-25 MG PO TABS: 1.0000 | ORAL_TABLET | Freq: Every day | ORAL | 2 refills | Status: DC

## 2020-04-22 MED ORDER — OLMESARTAN MEDOXOMIL-HCTZ 40-25 MG PO TABS: 1.0000 | ORAL_TABLET | Freq: Every day | ORAL | 0 refills | Status: DC

## 2020-04-22 MED ORDER — AMLODIPINE BESYLATE 10 MG PO TABS: 10.0000 mg | ORAL_TABLET | Freq: Every day | ORAL | 0 refills | Status: DC

## 2020-04-22 MED ORDER — CLOPIDOGREL BISULFATE 75 MG PO TABS: 75.0000 mg | ORAL_TABLET | Freq: Every day | ORAL | 2 refills | Status: DC

## 2020-04-22 NOTE — Progress Notes (Signed)
Dr. Marlou Porch, I have recently changed all of my medication orders from [facility] to [facility], with [facility] [address] as my local alternate.  My current supply for all my medications will run out on June 18.   Last week I requested these refills through Dr. Moreen Fowler.  In the past Dr. Moreen Fowler would accommodate those refills that originated with Dr. Wynonia Lawman.  But yesterday I was informed that Dr. Moreen Fowler would like you to take over this process.   So now I'm requesting new 90 day refills preferably to [facility] for the following medications I am currently taking that originated with Dr. Wynonia Lawman.   1. Allupurinal 300MG  2. Amilodipine 10 MG  3. Clopidogrel 75 MG 4. Olmesartan-HCZT 40-25 MG   Unfortunately, due to the shipping time of the mail order to meet a June 18 deadline, these refills may need to [facility] this time and later moved to [employer] order.     Thank You!  Garrett Jordan, [place] [phone] [email or web address] [phone]   #30 [facility] for Amlodipine 10 mg daily, Clopidogrel 75 mg daily and Olmesartan-HCTZ 40-25 mg daily.  Allopurinol will need to be filled by PCP since not a cardiac medication.   The 3 named also sent electronically to [facility] for 90 day supply.

## 2020-05-20 ENCOUNTER — Other Ambulatory Visit: Payer: Self-pay | Admitting: Cardiology

## 2020-06-03 ENCOUNTER — Other Ambulatory Visit: Payer: Self-pay | Admitting: Cardiology

## 2020-06-08 ENCOUNTER — Other Ambulatory Visit: Payer: Self-pay | Admitting: Interventional Radiology

## 2020-06-08 DIAGNOSIS — IMO0001 Reserved for inherently not codable concepts without codable children: Secondary | ICD-10-CM

## 2020-06-08 DIAGNOSIS — I714 Abdominal aortic aneurysm, without rupture, unspecified: Secondary | ICD-10-CM

## 2020-06-15 ENCOUNTER — Telehealth: Payer: Self-pay | Admitting: Internal Medicine

## 2020-06-15 ENCOUNTER — Encounter: Payer: Self-pay | Admitting: Internal Medicine

## 2020-06-15 NOTE — Telephone Encounter (Signed)
Pt informed via MyChart

## 2020-06-15 NOTE — Telephone Encounter (Signed)
Patient requests to be called at ph# [phone] re: Patient has an appointment with Dr. Kelton Pillar scheduled for 06/17/20, however patient woke up today with a cough and would like know if Dr. Kelton Pillar would be willing to do a virtual/MyChart visit or would she prefer to have patient reschedule appointment.

## 2020-06-15 NOTE — Telephone Encounter (Signed)
Please advise 

## 2020-06-17 ENCOUNTER — Telehealth (INDEPENDENT_AMBULATORY_CARE_PROVIDER_SITE_OTHER): Payer: Medicare Other | Admitting: Internal Medicine

## 2020-06-17 ENCOUNTER — Other Ambulatory Visit: Payer: Self-pay

## 2020-06-17 DIAGNOSIS — Z794 Long term (current) use of insulin: Secondary | ICD-10-CM

## 2020-06-17 DIAGNOSIS — E1165 Type 2 diabetes mellitus with hyperglycemia: Secondary | ICD-10-CM

## 2020-06-17 MED ORDER — GLIPIZIDE 5 MG PO TABS: 2.5000 mg | ORAL_TABLET | Freq: Two times a day (BID) | ORAL | 3 refills | Status: DC

## 2020-06-17 MED ORDER — ACCU-CHEK GUIDE VI STRP: ORAL_STRIP | 12 refills | Status: DC

## 2020-06-17 NOTE — Progress Notes (Signed)
Virtual Visit via Video Note  I connected with Garrett Jordan Sr. on 06/17/20 at  9:30 AM EDT by a video enabled telemedicine application and verified that I am speaking with the correct person using two identifiers.   I discussed the limitations of evaluation and management by telemedicine and the availability of in person appointments. The patient expressed understanding and agreed to proceed.   -Location of the patient : Home  -Location of the provider : Office -The names of all persons participating in the telemedicine service : Pt and myself        Name: Garrett Jordan  Age/ Sex: 79 y.o., male   MRN/ DOB: [id number], 1941-05-19     PCP: Antony Contras, MD   Reason for Endocrinology Evaluation: Type 2 Diabetes Mellitus     Initial Endocrinology Clinic Visit:  03/17/2020    PATIENT IDENTIFIER: [name]. is a 79 y.o. male with a past medical history of T2DM, RLS, CAD and CKD. The patient has followed with Endocrinology clinic since 03/17/2020 for consultative assistance with management of his diabetes.  DIABETIC HISTORY:  [name] was diagnosed with T2 DM in 2019. Invokana- cost prohibitive . His hemoglobin A1c has ranged from 6.7% in 2020, peaking at 7.8 % in 2021    On his initial visit to our clinic his A1c was 6.5 % , he was on Januvia which was continued, Invokana was cost prohibitive and we switched it to Glipizide.   Januvia stopped 06/2020 due to high cost for pt   HYPERCALCEMIA HISTORY: [name] indicates that he was first noted with hypercalcemia in 2020. He has hx of kidney stones  He is S/P Left parathyroidectomy in 1970's  Ca/Cr ratio 0.0210  SUBJECTIVE:   During the last visit (03/17/2020): A1c 6.5 %. he was on Januvia which was continued, Invokana was cost prohibitive and we switched it to Glipizide.    Today (06/17/2020): Mr. Orlick is here for a follow up on diabetes management.  He checks his blood sugars 1 times daily, preprandial to  breakfast . The patient has not had hypoglycemic episodes since the last clinic visit.  He is having cough, runny nose and sinus congestion  but no fever.  Denies nausea or vomiting     HOME DIABETES REGIMEN:  Januvia 50 mg daily  Glipizide 5 mg, HALF a tablet before Breakfast   Statin: yes ACE-I/ARB: yes    GLUCOSE LOG:   08/03      155 08/02      140 08/01      135 07/31      782   DIABETIC COMPLICATIONS: Microvascular complications:   CKD  Denies: retinopathy, neuropathy   Last Eye Exam: Completed 01/2020  Macrovascular complications:   CAD  Denies: CVA, PVD   HISTORY:  Past Medical History:  Past Medical History:  Diagnosis Date  . AAA (abdominal aortic aneurysm) ([place])   . Arthritis    knees  . Atrial fibrillation ([place])   . CAD (coronary artery disease)   . Cancer [facility])    prostate  . Diabetes mellitus without complication ([place])   . GERD (gastroesophageal reflux disease)   . Gout   . Hyperlipidemia   . Hypertension   . Hypothyroidism   . Kidney stone    right kidney  . Myocardial infarction ([place]) 01/1991   sp inferior  . Nerve compression    right leg  . Reflux   . Seasonal allergies   .  Thyroid disease    hypothyroidism    Past Surgical History:  Past Surgical History:  Procedure Laterality Date  . ABDOMINAL AORTAGRAM N/A 02/28/2012   Procedure: ABDOMINAL Maxcine Ham;  Surgeon: Serafina Mitchell, MD;  Location: [facility] CATH LAB;  Service: Cardiovascular;  Laterality: N/A;  . abdominal aortagram embolization  02/28/2012  . ABDOMINAL AORTIC ANEURYSM REPAIR  Sept. 2013  . CORONARY ARTERY BYPASS GRAFT  04/2009  . [place] and  2009   RCA  . EMBOLIZATION Right 02/28/2012   Procedure: EMBOLIZATION;  Surgeon: Serafina Mitchell, MD;  Location: [facility] CATH LAB;  Service: Cardiovascular;  Laterality: Right;  . IR GENERIC HISTORICAL  12/05/2016   IR RADIOLOGIST EVAL & MGMT 12/05/2016 Jacqulynn Cadet, MD [facility] INTERV RAD  . IR RADIOLOGIST  EVAL & MGMT  07/10/2019  . KIDNEY STONE SURGERY    . LEFT HEART CATHETERIZATION WITH CORONARY/GRAFT ANGIOGRAM N/A 01/08/2015   Procedure: LEFT HEART CATHETERIZATION WITH Beatrix Fetters;  Surgeon: Jacolyn Reedy, MD;  Location: [facility] CATH LAB;  Service: Cardiovascular;  Laterality: N/A;  . parathyroid adenoma    . PROSTATE SURGERY     rad seeds  . SPINE SURGERY    . TONSILLECTOMY       Social History:  reports that he quit smoking about 49 years ago. His smoking use included cigarettes. He has never used smokeless tobacco. He reports current alcohol use of about 6.0 - 9.0 standard drinks of alcohol per week. He reports that he does not use drugs. Family History:  Family History  Problem Relation Age of Onset  . Heart disease Father        Heart Disease before age 30  . Heart attack Father   . Hyperlipidemia Father   . Hypertension Father   . Stroke Mother   . Deep vein thrombosis Mother   . Heart disease Sister   . Diabetes Sister   . Hyperlipidemia Sister   . Hypertension Sister   . Heart attack Sister   . Heart disease Paternal Uncle   . Diabetes Maternal Grandmother   . Diabetes Sister       HOME MEDICATIONS: Allergies as of 06/17/2020      Reactions   Ace Inhibitors Cough   Crestor [rosuvastatin] Other (See Comments)   Muscle aches   Nitroglycerin Other (See Comments)   Very pronounced lowered BP with IV nitro for caths   Quinolones    Aortic root enlargement      Medication List       Accurate as of June 17, 2020  7:07 AM. If you have any questions, ask your nurse or doctor.        ACCU-CHEK AVIVA VI by In Vitro route.   allopurinol 300 MG tablet Commonly known as: ZYLOPRIM Take 300 mg by mouth every other day. Take in the evening - on even days of the month   amLODipine 10 MG tablet Commonly known as: NORVASC TAKE 1 TABLET BY MOUTH EVERY DAY   clonazePAM 1 MG tablet Commonly known as: KLONOPIN Take 0.5 mg by mouth at bedtime.     clopidogrel 75 MG tablet Commonly known as: PLAVIX Take 1 tablet (75 mg total) by mouth daily.   EQL CoQ10 300 MG Caps Generic drug: Coenzyme Q10 Take by mouth.   esomeprazole 20 MG capsule Commonly known as: NEXIUM Take 20 mg by mouth daily at 12 noon.   fluorouracil 5 % cream Commonly known as: EFUDEX Use as directed  fluticasone 50 MCG/ACT nasal spray Commonly known as: FLONASE Place 2 sprays into both nostrils daily as needed for allergies.   glipiZIDE 5 MG tablet Commonly known as: GLUCOTROL Take 0.5 tablets (2.5 mg total) by mouth daily before breakfast.   ibuprofen 200 MG tablet Commonly known as: ADVIL Take 200 mg by mouth as needed.   Januvia 50 MG tablet Generic drug: sitaGLIPtin Take 50 mg by mouth daily.   levothyroxine 175 MCG tablet Commonly known as: SYNTHROID Take 175 mcg by mouth daily before breakfast.   loratadine-pseudoephedrine 10-240 MG 24 hr tablet Commonly known as: CLARITIN-D 24-hour Take 1 tablet by mouth at bedtime.   metoprolol tartrate 25 MG tablet Commonly known as: LOPRESSOR Take 1 tablet (25 mg total) by mouth 2 (two) times daily.   olmesartan-hydrochlorothiazide 40-25 MG tablet Commonly known as: BENICAR HCT Take 1 tablet by mouth daily.   PreserVision AREDS 2 Caps Take 1 capsule by mouth 2 (two) times daily.   rosuvastatin 10 MG tablet Commonly known as: CRESTOR Take 10 mg by mouth as directed. Per patient, takes Mondays & Thursdays   spironolactone 25 MG tablet Commonly known as: ALDACTONE Take 1 tablet (25 mg total) by mouth daily.   tamsulosin 0.4 MG Caps capsule Commonly known as: FLOMAX Take 0.4 mg by mouth every other day. Take in the evening - on even days of the month   triamcinolone cream 0.1 % Commonly known as: KENALOG Apply 1 application topically as needed.   Vascepa 1 g capsule Generic drug: icosapent Ethyl Take 2 g by mouth 2 (two) times daily.   vitamin B-12 1000 MCG tablet Commonly known as:  CYANOCOBALAMIN Take 2,500 mcg by mouth at bedtime.   Vitamin D3 50 MCG (2000 UT) Tabs Take 2,000 Units by mouth daily.       DM foot exam: 03/17/2020  The skin of the feet is intact without sores or ulcerations. The pedal pulses are 2+ on right and 2+ on left. The sensation is intact to a screening 5.07, 10 gram monofilament bilaterally   DATA REVIEWED:  Lab Results  Component Value Date   HGBA1C 6.5 (A) 03/17/2020   HGBA1C 6.0 (H) 01/07/2015   HGBA1C  04/28/2009    5.4 (NOTE) The ADA recommends the following therapeutic goal for glycemic control related to Hgb A1c measurement: Goal of therapy: <6.5 Hgb A1c  Reference: American Diabetes Association: Clinical Practice Recommendations 2010, Diabetes Care, 2010, 33: (Suppl  1).   Lab Results  Component Value Date   LDLCALC 102 (H) 01/08/2015   CREATININE 1.52 (H) 03/17/2020         12/17/2019  A1c 7.8%  BUN/Cr 31/1.68 GFR 40 Calcium 10.9 (corrected calcium 10.41) Tg 946  HDL 25 LDL 52 Vitamin D 37.4   ASSESSMENT / PLAN / RECOMMENDATIONS:   1) Type 2 Diabetes Mellitus, Optimally controlled, With CKD III complications - Most recent A1c of 6.5 %. Goal A1c < 7.0%.   - He has been tolerating Glipizide well, but he would like to discontinue the Januvia due to high cost - Will make the following changes  - A prescription for Accu chek guide strips was sent    MEDICATIONS:  STOP Januvia   Increase Glipizide 5 mg, half a tablet before Breakfast and Supper     EDUCATION / INSTRUCTIONS:  BG monitoring instructions: Patient is instructed to check his blood sugars 2 times a day, before breakfast and supper .  Call [place] [facility] if: BG persistently <  70  . I reviewed the Rule of 15 for the treatment of hypoglycemia in detail with the patient. Literature supplied.     2) Diabetic complications:   Eye: Does not have known diabetic retinopathy.   Neuro/ Feet: Does not have known diabetic  peripheral neuropathy .   Renal: Patient does  have known baseline CKD. He   is on an ACEI/ARB at present.    3) Hypertriglyceridemia: Patient is on rosuvastatin twice a week due to myalgia. He is on  Vascepa as well started in 2021.  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 The patient was provided an opportunity to ask questions and all were answered. The patient agreed with the plan and demonstrated an understanding of the instructions.      F/U in 4 months    Signed electronically by: Mack Guise, MD  [facility] Endocrinology  [facility] [address] [place], [place] [address] Phone: [phone] FAX: [phone]   CC: Antony Contras, MD [address] [place] [address] Phone: [phone]  Fax: [phone]  Return to Endocrinology clinic as below: Future Appointments  Date Time Provider [place]  06/17/2020  9:30 AM Brylee Mcgreal, Melanie Crazier, MD [id number] None

## 2020-07-01 ENCOUNTER — Other Ambulatory Visit: Payer: Self-pay | Admitting: Interventional Radiology

## 2020-07-01 DIAGNOSIS — I714 Abdominal aortic aneurysm, without rupture, unspecified: Secondary | ICD-10-CM

## 2020-07-01 DIAGNOSIS — IMO0001 Reserved for inherently not codable concepts without codable children: Secondary | ICD-10-CM

## 2020-07-01 DIAGNOSIS — T82330A Leakage of aortic (bifurcation) graft (replacement), initial encounter: Secondary | ICD-10-CM

## 2020-08-03 ENCOUNTER — Ambulatory Visit: Payer: Medicare Other | Admitting: Cardiology

## 2020-08-03 ENCOUNTER — Encounter: Payer: Self-pay | Admitting: Cardiology

## 2020-08-03 ENCOUNTER — Other Ambulatory Visit: Payer: Self-pay

## 2020-08-03 ENCOUNTER — Telehealth: Payer: Self-pay | Admitting: Cardiology

## 2020-08-03 VITALS — BP 124/76 | HR 73 | Ht 70.0 in | Wt 195.0 lb

## 2020-08-03 DIAGNOSIS — Z951 Presence of aortocoronary bypass graft: Secondary | ICD-10-CM

## 2020-08-03 DIAGNOSIS — I2510 Atherosclerotic heart disease of native coronary artery without angina pectoris: Secondary | ICD-10-CM | POA: Diagnosis not present

## 2020-08-03 NOTE — Telephone Encounter (Signed)
Spoke to patient regarding CP. Not currently having CP but let me know he was checking in here at the office now for his appointment made this am.   This RN went and updated MD Skains RN Pam in clinic.

## 2020-08-03 NOTE — Telephone Encounter (Signed)
Garrett Jordan has sent a message via pt schedule requesting an appointment with Dr. Marlou Porch stating he is having increasing CP occurrences. Please advise.

## 2020-08-03 NOTE — Patient Instructions (Signed)

## 2020-08-03 NOTE — Progress Notes (Signed)
Cardiology Office Note:    Date:  08/03/2020   ID:  Garrett Meek Sr., DOB 1941/07/17, MRN [id number]  PCP:  Antony Contras, MD  [facility] Cardiologist:  Candee Furbish, MD  [facility] Electrophysiologist:  None   Referring MD: Antony Contras, MD     History of Present Illness:    Garrett Capek. is a 79 y.o. male here for the evaluation of chest pain.  Left upper chest pain as well as epigastric pain.  In august tested negative for COVID. Sharp pains in chest then, with cough. Then 1.5 weeks ago felt shart pains agin. Sudden chest pain, took breath away. 6 min ago did it twice. Quick flash.   Prior CABG in 2010 AAA stent graft as well-Dr. 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  Continuing to follow, Dr. Trula Slade.    Past Medical History:  Diagnosis Date  . AAA (abdominal aortic aneurysm) ([place])   . Arthritis    knees  . Atrial fibrillation ([place])   . CAD (coronary artery disease)   . Cancer [facility])    prostate  . Diabetes mellitus without complication ([place])   . GERD (gastroesophageal reflux disease)   . Gout   . Hyperlipidemia   . Hypertension   . Hypothyroidism   . Kidney stone    right kidney  . Myocardial infarction ([place]) 01/1991   sp inferior  . Nerve compression    right leg  . Reflux   . Seasonal allergies   . Thyroid disease    hypothyroidism    Past Surgical History:  Procedure Laterality Date  . ABDOMINAL AORTAGRAM N/A 02/28/2012   Procedure: ABDOMINAL Maxcine Ham;  Surgeon: Serafina Mitchell, MD;  Location: [facility] CATH LAB;  Service: Cardiovascular;  Laterality: N/A;  . abdominal aortagram embolization  02/28/2012  . ABDOMINAL AORTIC ANEURYSM REPAIR  Sept. 2013  . CORONARY ARTERY BYPASS GRAFT  04/2009  . [place] and  2009   RCA  . EMBOLIZATION Right 02/28/2012   Procedure: EMBOLIZATION;  Surgeon: Serafina Mitchell, MD;  Location: [facility] CATH LAB;  Service: Cardiovascular;   Laterality: Right;  . IR GENERIC HISTORICAL  12/05/2016   IR RADIOLOGIST EVAL & MGMT 12/05/2016 Jacqulynn Cadet, MD [facility] INTERV RAD  . IR RADIOLOGIST EVAL & MGMT  07/10/2019  . KIDNEY STONE SURGERY    . LEFT HEART CATHETERIZATION WITH CORONARY/GRAFT ANGIOGRAM N/A 01/08/2015   Procedure: LEFT HEART CATHETERIZATION WITH Beatrix Fetters;  Surgeon: Jacolyn Reedy, MD;  Location: [facility] CATH LAB;  Service: Cardiovascular;  Laterality: N/A;  . parathyroid adenoma    . PROSTATE SURGERY     rad seeds  . SPINE SURGERY    . TONSILLECTOMY      Current Medications: Current Meds  Medication Sig  . allopurinol (ZYLOPRIM) 300 MG tablet Take 300 mg by mouth every other day. Take in the evening - on even days of the month  . amLODipine (NORVASC) 10 MG tablet TAKE 1 TABLET BY MOUTH EVERY DAY  . Blood Glucose Calibration (ACCU-CHEK AVIVA VI) by In Vitro route.  . Cholecalciferol (VITAMIN D3) 2000 UNITS TABS Take 2,000 Units by mouth daily.   . clonazePAM (KLONOPIN) 1 MG tablet Take 0.5 mg by mouth at bedtime.   . clopidogrel (PLAVIX) 75 MG tablet Take 1 tablet (75 mg total) by mouth daily.  . Coenzyme Q10 (EQL COQ10) 300 MG CAPS Take by mouth.  Marland Kitchen  esomeprazole (NEXIUM) 20 MG capsule Take 20 mg by mouth daily at 12 noon.  . fluorouracil (EFUDEX) 5 % cream Use as directed  . fluticasone (FLONASE) 50 MCG/ACT nasal spray Place 2 sprays into both nostrils daily as needed for allergies.   Marland Kitchen glipiZIDE (GLUCOTROL) 5 MG tablet Take 0.5 tablets (2.5 mg total) by mouth 2 (two) times daily before a meal.  . glucose blood (ACCU-CHEK GUIDE) test strip 2x daily  . ibuprofen (ADVIL,MOTRIN) 200 MG tablet Take 200 mg by mouth as needed.  Marland Kitchen JARDIANCE 10 MG TABS tablet Take 10 mg by mouth daily.  Marland Kitchen levothyroxine (SYNTHROID, LEVOTHROID) 175 MCG tablet Take 175 mcg by mouth daily before breakfast.  . loratadine-pseudoephedrine (CLARITIN-D 24-HOUR) 10-240 MG per 24 hr tablet Take 1 tablet by mouth at bedtime.   .  metoprolol succinate (TOPROL-XL) 50 MG 24 hr tablet Take 25 mg by mouth in the morning and at bedtime.  . Multiple Vitamins-Minerals (PRESERVISION AREDS 2) CAPS Take 1 capsule by mouth 2 (two) times daily.  Marland Kitchen olmesartan-hydrochlorothiazide (BENICAR HCT) 40-25 MG tablet Take 1 tablet by mouth daily.  . rosuvastatin (CRESTOR) 10 MG tablet Take 10 mg by mouth as directed. Per patient, takes Mondays & Thursdays  . Tamsulosin HCl (FLOMAX) 0.4 MG CAPS Take 0.4 mg by mouth every other day. Take in the evening - on even days of the month  . triamcinolone cream (KENALOG) 0.1 % Apply 1 application topically as needed.  Marland Kitchen VASCEPA 1 g capsule Take 2 g by mouth 2 (two) times daily.  . vitamin B-12 (CYANOCOBALAMIN) 1000 MCG tablet Take 2,500 mcg by mouth every other day.      Allergies:   Ace inhibitors, Crestor [rosuvastatin], Nitroglycerin, and Quinolones   Social History   Socioeconomic History  . Marital status: Divorced    Spouse name: Not on file  . Number of children: Not on file  . Years of education: Not on file  . Highest education level: Not on file  Occupational History  . Not on file  Tobacco Use  . Smoking status: Former Smoker    Types: Cigarettes    Quit date: 08/01/1970    Years since quitting: 50.0  . Smokeless tobacco: Never Used  Vaping Use  . Vaping Use: Never used  Substance and Sexual Activity  . Alcohol use: Yes    Alcohol/week: 6.0 - 9.0 standard drinks    Types: 4 - 5 Glasses of wine, 2 - 4 Cans of beer per week    Comment: 1-3 glasses 3-4 times a week  . Drug use: No  . Sexual activity: Not on file  Other Topics Concern  . Not on file  Social History Narrative  . Not on file   Social Determinants of Health   Financial Resource Strain:   . Difficulty of Paying Living Expenses: Not on file  Food Insecurity:   . Worried About Charity fundraiser in the Last Year: Not on file  . Ran Out of Food in the Last Year: Not on file  Transportation Needs:   . Lack of  Transportation (Medical): Not on file  . Lack of Transportation (Non-Medical): Not on file  Physical Activity:   . Days of Exercise per Week: Not on file  . Minutes of Exercise per Session: Not on file  Stress:   . Feeling of Stress : Not on file  Social Connections:   . Frequency of Communication with Friends and Family: Not on file  .  Frequency of Social Gatherings with Friends and Family: Not on file  . Attends Religious Services: Not on file  . Active Member of Clubs or Organizations: Not on file  . Attends Archivist Meetings: Not on file  . Marital Status: Not on file     Family History: The patient's family history includes Deep vein thrombosis in his mother; Diabetes in his maternal grandmother, sister, and sister; Heart attack in his father and sister; Heart disease in his father, paternal uncle, and sister; Hyperlipidemia in his father and sister; Hypertension in his father and sister; Stroke in his mother.  ROS:   Please see the history of present illness.     All other systems reviewed and are negative.  EKGs/Labs/Other Studies Reviewed:    The following studies were reviewed today:   The left ventricular ejection fraction is mildly decreased (45-54%).  Nuclear stress EF: 50%.  No T wave inversion was noted during stress.  There was no ST segment deviation noted during stress.  Defect 1: There is a medium defect of mild severity present in the basal inferior and mid inferior location.  This is a low risk study.   Medium size, mild intensity basal to mid inferior perfusion defect which is mostly fixed, suggestive of infarct without significant ischemia. This correlates with known occlusion of the SVG to PDA. No other significant reversible ischemia. LVEF 50% with basal to mid inferior hypokinesis. This is a low risk study.   Cardiac catheterization 01/08/2015: 1. Severe native three-vessel coronary artery disease 2.  Occlusion of the saphenous vein  graft to very small posterior descending artery with stenosis in the right coronary artery graft prior to that in the place of a previous stent, the other grafts are widely patent 3.  Preserved LV function with inferior hypokinesis 4.  Moderately severe stenosis at the distal portion of a small graft to the right ventricular branch    EKG:  EKG is  ordered today.  The ekg ordered today demonstrates sinus rhythm 73 right bundle branch block left anterior fascicular block bifascicular block  Recent Labs: 03/17/2020: BUN 31; Creatinine, Ser 1.52; Potassium 4.0; Sodium 134  Recent Lipid Panel    Component Value Date/Time   CHOL 195 01/08/2015 0404   TRIG 318 (H) 01/08/2015 0404   HDL 29 (L) 01/08/2015 0404   CHOLHDL 6.7 01/08/2015 0404   VLDL 64 (H) 01/08/2015 0404   LDLCALC 102 (H) 01/08/2015 0404    Physical Exam:    VS:  BP 124/76   Pulse 73   Ht 5\' 10"  (1.778 m)   Wt 195 lb (88.5 kg)   SpO2 98%   BMI 27.98 kg/m     Wt Readings from Last 3 Encounters:  08/03/20 195 lb (88.5 kg)  03/24/20 200 lb (90.7 kg)  03/17/20 197 lb 12.8 oz (89.7 kg)     GEN:  Well nourished, well developed in no acute distress HEENT: Normal NECK: No JVD; No carotid bruits LYMPHATICS: No lymphadenopathy CARDIAC: RRR, no murmurs, rubs, gallops RESPIRATORY:  Clear to auscultation without rales, wheezing or rhonchi  ABDOMEN: Soft, non-tender, non-distended MUSCULOSKELETAL:  No edema; No deformity  SKIN: Warm and dry NEUROLOGIC:  Alert and oriented x 3 PSYCHIATRIC:  Normal affect   ASSESSMENT:    1. Coronary artery disease involving native coronary artery of native heart without angina pectoris   2. S/P CABG (coronary artery bypass graft)    PLAN:    In order of problems listed above:  Coronary disease CABG -Has been experiencing chest discomfort which sounds more musculoskeletal or neuropathic in etiology.  Sudden, sharp, fleeting. -Recent CT scan of chest from 03/03/2020 has been reviewed.   Nuclear stress test from this time has been reviewed as well.  Overall both are reassuring.  EKG unremarkable with no ischemic changes. -Reassurance has been given.  Obviously if symptoms become more worrisome, he will let us know.  Abdominal aortic aneurysm stent graft repair -Endoleak.  Asked him to make sure he gets an appointment with Dr. Trula Slade.    Medication Adjustments/Labs and Tests Ordered: Current medicines are reviewed at length with the patient today.  Concerns regarding medicines are outlined above.  Orders Placed This Encounter  Procedures  . EKG 12-Lead   No orders of the defined types were placed in this encounter.   Patient Instructions  Medication Instructions:  The current medical regimen is effective;  continue present plan and medications.  *If you need a refill on your cardiac medications before your next appointment, please call your pharmacy*  Follow-Up: At [facility], you and your health needs are our priority.  As part of our continuing mission to provide you with exceptional heart care, we have created designated Provider Care Teams.  These Care Teams include your primary Cardiologist (physician) and Advanced Practice Providers (APPs -  Physician Assistants and Nurse Practitioners) who all work together to provide you with the care you need, when you need it.  We recommend signing up for the patient portal called "MyChart".  Sign up information is provided on this After Visit Summary.  MyChart is used to connect with patients for Virtual Visits (Telemedicine).  Patients are able to view lab/test results, encounter notes, upcoming appointments, etc.  Non-urgent messages can be sent to your provider as well.   To learn more about what you can do with MyChart, go to [email or web address].    Your next appointment:   6 month(s)  The format for your next appointment:   In Person  Provider:   Candee Furbish, MD   Thank you for choosing [facility]!!         Signed, Candee Furbish, MD  08/03/2020 12:16 PM    [place]

## 2020-08-05 ENCOUNTER — Other Ambulatory Visit: Payer: Self-pay | Admitting: Interventional Radiology

## 2020-08-05 DIAGNOSIS — IMO0001 Reserved for inherently not codable concepts without codable children: Secondary | ICD-10-CM

## 2020-08-05 DIAGNOSIS — I714 Abdominal aortic aneurysm, without rupture, unspecified: Secondary | ICD-10-CM

## 2020-08-19 ENCOUNTER — Ambulatory Visit (HOSPITAL_COMMUNITY)
Admission: RE | Admit: 2020-08-19 | Discharge: 2020-08-19 | Disposition: A | Discharge status: Home / Self Care | Payer: Medicare Other | Source: Ambulatory Visit | Attending: Interventional Radiology | Admitting: Interventional Radiology

## 2020-08-19 ENCOUNTER — Other Ambulatory Visit: Payer: Self-pay

## 2020-08-19 ENCOUNTER — Encounter (HOSPITAL_COMMUNITY): Payer: Self-pay

## 2020-08-19 DIAGNOSIS — I714 Abdominal aortic aneurysm, without rupture, unspecified: Secondary | ICD-10-CM

## 2020-08-19 DIAGNOSIS — M5136 Other intervertebral disc degeneration, lumbar region: Secondary | ICD-10-CM | POA: Diagnosis not present

## 2020-08-19 DIAGNOSIS — K429 Umbilical hernia without obstruction or gangrene: Secondary | ICD-10-CM | POA: Insufficient documentation

## 2020-08-19 DIAGNOSIS — K760 Fatty (change of) liver, not elsewhere classified: Secondary | ICD-10-CM | POA: Insufficient documentation

## 2020-08-19 DIAGNOSIS — K8020 Calculus of gallbladder without cholecystitis without obstruction: Secondary | ICD-10-CM | POA: Insufficient documentation

## 2020-08-19 DIAGNOSIS — IMO0001 Reserved for inherently not codable concepts without codable children: Secondary | ICD-10-CM

## 2020-08-19 DIAGNOSIS — N200 Calculus of kidney: Secondary | ICD-10-CM | POA: Diagnosis not present

## 2020-08-19 LAB — POCT I-STAT CREATININE: Creatinine, Ser: 1.7 mg/dL — ABNORMAL HIGH (ref 0.61–1.24)

## 2020-08-19 MED ORDER — IOHEXOL 350 MG/ML SOLN
100.0000 mL | Freq: Once | INTRAVENOUS | Status: AC | PRN
  Administered 2020-08-19: 80 mL via INTRAVENOUS

## 2020-08-25 ENCOUNTER — Encounter: Payer: Self-pay | Admitting: *Deleted

## 2020-08-25 ENCOUNTER — Ambulatory Visit
Admission: RE | Admit: 2020-08-25 | Discharge: 2020-08-25 | Disposition: A | Discharge status: Home / Self Care | Payer: Medicare Other | Source: Ambulatory Visit | Attending: Interventional Radiology | Admitting: Interventional Radiology

## 2020-08-25 ENCOUNTER — Other Ambulatory Visit: Payer: Self-pay

## 2020-08-25 DIAGNOSIS — I714 Abdominal aortic aneurysm, without rupture, unspecified: Secondary | ICD-10-CM

## 2020-08-25 DIAGNOSIS — IMO0001 Reserved for inherently not codable concepts without codable children: Secondary | ICD-10-CM

## 2020-08-25 HISTORY — PX: IR RADIOLOGIST EVAL & MGMT: IMG5224

## 2020-08-25 NOTE — Progress Notes (Signed)
Referring Physician(s): McCullough,Heath  Chief Complaint: The patient called in follow up today s/p type II endoleak repair  History of present illness: Garrett Jordan. is a 79 y.o. male with a history of abdominal aortic aneurysm status post endovascular aortic repair with bifurcated stent graft in September 2013. 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  He underwent endovascular endoleak repair with coil embolization of the flow channel in the aneurysm sac in the origin of the inferior mesenteric artery on 07/16/2015. He presents today for follow-up via telephone visit with myself in lieu of Dr. Katrinka Blazing absence.    Mr. Prabhakar is doing very well.  CTA on 08/19/20 demonstrates cessation of endoleak and stable size of aneurysm sac.  He has lost approximately 10 pounds over the past year due to healthier lifestyle and augmentation of diabetic medication.  He is trying to stay active in the gym and yard.  He continues to endorse ongoing bilateral hip and low back pain.  He denies significant changes in bowel or bladder habits. No chest pain, shortness of breath or other systemic symptoms.  Past Medical History:  Diagnosis Date  . AAA (abdominal aortic aneurysm) ([place])   . Arthritis    knees  . Atrial fibrillation ([place])   . CAD (coronary artery disease)   . Cancer [facility])    prostate  . Diabetes mellitus without complication ([place])   . GERD (gastroesophageal reflux disease)   . Gout   . Hyperlipidemia   . Hypertension   . Hypothyroidism   . Kidney stone    right kidney  . Myocardial infarction ([place]) 01/1991   sp inferior  . Nerve compression    right leg  . Reflux   . Seasonal allergies   . Thyroid disease    hypothyroidism    Past Surgical History:  Procedure Laterality Date  . ABDOMINAL AORTAGRAM N/A 02/28/2012   Procedure: ABDOMINAL Maxcine Ham;   Surgeon: Serafina Mitchell, MD;  Location: [facility] CATH LAB;  Service: Cardiovascular;  Laterality: N/A;  . abdominal aortagram embolization  02/28/2012  . ABDOMINAL AORTIC ANEURYSM REPAIR  Sept. 2013  . CORONARY ARTERY BYPASS GRAFT  04/2009  . [place] and  2009   RCA  . EMBOLIZATION Right 02/28/2012   Procedure: EMBOLIZATION;  Surgeon: Serafina Mitchell, MD;  Location: [facility] CATH LAB;  Service: Cardiovascular;  Laterality: Right;  . IR GENERIC HISTORICAL  12/05/2016   IR RADIOLOGIST EVAL & MGMT 12/05/2016 Jacqulynn Cadet, MD [facility] INTERV RAD  . IR RADIOLOGIST EVAL & MGMT  07/10/2019  . KIDNEY STONE SURGERY    . LEFT HEART CATHETERIZATION WITH CORONARY/GRAFT ANGIOGRAM N/A 01/08/2015   Procedure: LEFT HEART CATHETERIZATION WITH Beatrix Fetters;  Surgeon: Jacolyn Reedy, MD;  Location: [facility] CATH LAB;  Service: Cardiovascular;  Laterality: N/A;  . parathyroid adenoma    . PROSTATE SURGERY     rad seeds  . SPINE SURGERY    . TONSILLECTOMY      Allergies: Ace inhibitors, Crestor [rosuvastatin], Nitroglycerin, and Quinolones  Medications: Prior to Admission medications   Medication Sig Start Date End Date Taking? Authorizing Provider  allopurinol (ZYLOPRIM) 300 MG tablet Take 300 mg by mouth every other day. Take in the evening - on even days of the month    [provider]  amLODipine (NORVASC) 10 MG tablet TAKE 1 TABLET BY MOUTH EVERY  DAY 06/03/20   Jerline Pain, MD  Blood Glucose Calibration (ACCU-CHEK AVIVA VI) by In Vitro route.    [provider]  Cholecalciferol (VITAMIN D3) 2000 UNITS TABS Take 2,000 Units by mouth daily.     [provider]  clonazePAM (KLONOPIN) 1 MG tablet Take 0.5 mg by mouth at bedtime.     [provider]  clopidogrel (PLAVIX) 75 MG tablet Take 1 tablet (75 mg total) by mouth daily. 04/22/20   Jerline Pain, MD  Coenzyme Q10 (EQL COQ10) 300 MG CAPS Take by mouth.    [provider]  esomeprazole  (NEXIUM) 20 MG capsule Take 20 mg by mouth daily at 12 noon.    [provider]  fluorouracil (EFUDEX) 5 % cream Use as directed    [provider]  fluticasone (FLONASE) 50 MCG/ACT nasal spray Place 2 sprays into both nostrils daily as needed for allergies.     [provider]  glipiZIDE (GLUCOTROL) 5 MG tablet Take 0.5 tablets (2.5 mg total) by mouth 2 (two) times daily before a meal. 06/17/20   Shamleffer, Melanie Crazier, MD  glucose blood (ACCU-CHEK GUIDE) test strip 2x daily 06/17/20   Shamleffer, Melanie Crazier, MD  ibuprofen (ADVIL,MOTRIN) 200 MG tablet Take 200 mg by mouth as needed.    [provider]  JARDIANCE 10 MG TABS tablet Take 10 mg by mouth daily. 07/28/20   [provider]  levothyroxine (SYNTHROID, LEVOTHROID) 175 MCG tablet Take 175 mcg by mouth daily before breakfast.    [provider]  loratadine-pseudoephedrine (CLARITIN-D 24-HOUR) 10-240 MG per 24 hr tablet Take 1 tablet by mouth at bedtime.     [provider]  metoprolol succinate (TOPROL-XL) 50 MG 24 hr tablet Take 25 mg by mouth in the morning and at bedtime. 06/18/20   [provider]  Multiple Vitamins-Minerals (PRESERVISION AREDS 2) CAPS Take 1 capsule by mouth 2 (two) times daily.    [provider]  olmesartan-hydrochlorothiazide (BENICAR HCT) 40-25 MG tablet Take 1 tablet by mouth daily. 04/22/20   Jerline Pain, MD  rosuvastatin (CRESTOR) 10 MG tablet Take 10 mg by mouth as directed. Per patient, takes Mondays & Thursdays    [provider]  Tamsulosin HCl (FLOMAX) 0.4 MG CAPS Take 0.4 mg by mouth every other day. Take in the evening - on even days of the month    [provider]  triamcinolone cream (KENALOG) 0.1 % Apply 1 application topically as needed. 02/07/18   [provider]  VASCEPA 1 g capsule Take 2 g by mouth 2 (two) times daily. 01/01/20   [provider]  vitamin B-12 (CYANOCOBALAMIN) 1000  MCG tablet Take 2,500 mcg by mouth every other day.     [provider]     Family History  Problem Relation Age of Onset  . Heart disease Father        Heart Disease before age 59  . Heart attack Father   . Hyperlipidemia Father   . Hypertension Father   . Stroke Mother   . Deep vein thrombosis Mother   . Heart disease Sister   . Diabetes Sister   . Hyperlipidemia Sister   . Hypertension Sister   . Heart attack Sister   . Heart disease Paternal Uncle   . Diabetes Maternal Grandmother   . Diabetes Sister     Social History   Socioeconomic History  . Marital status: Divorced    Spouse name: Not  on file  . Number of children: Not on file  . Years of education: Not on file  . Highest education level: Not on file  Occupational History  . Not on file  Tobacco Use  . Smoking status: Former Smoker    Types: Cigarettes    Quit date: 08/01/1970    Years since quitting: 50.1  . Smokeless tobacco: Never Used  Vaping Use  . Vaping Use: Never used  Substance and Sexual Activity  . Alcohol use: Yes    Alcohol/week: 6.0 - 9.0 standard drinks    Types: 4 - 5 Glasses of wine, 2 - 4 Cans of beer per week    Comment: 1-3 glasses 3-4 times a week  . Drug use: No  . Sexual activity: Not on file  Other Topics Concern  . Not on file  Social History Narrative  . Not on file   Social Determinants of Health   Financial Resource Strain:   . Difficulty of Paying Living Expenses: Not on file  Food Insecurity:   . Worried About Charity fundraiser in the Last Year: Not on file  . Ran Out of Food in the Last Year: Not on file  Transportation Needs:   . Lack of Transportation (Medical): Not on file  . Lack of Transportation (Non-Medical): Not on file  Physical Activity:   . Days of Exercise per Week: Not on file  . Minutes of Exercise per Session: Not on file  Stress:   . Feeling of Stress : Not on file  Social Connections:   . Frequency of Communication with Friends and  Family: Not on file  . Frequency of Social Gatherings with Friends and Family: Not on file  . Attends Religious Services: Not on file  . Active Member of Clubs or Organizations: Not on file  . Attends Archivist Meetings: Not on file  . Marital Status: Not on file     Vital Signs: There were no vitals taken for this visit.   Imaging: No results found.  Labs:  CBC: No results for input(s): WBC, HGB, HCT, PLT in the last 8760 hours.  COAGS: No results for input(s): INR, APTT in the last 8760 hours.  BMP: Recent Labs    02/13/20 0916 03/17/20 1056 08/19/20 0830  NA 138 134*  --   K 4.1 4.0  --   CL 101 102  --   CO2 23 27  --   GLUCOSE 178* 146*  --   BUN 35* 31*  --   CALCIUM 9.8 10.4  --   CREATININE 1.94* 1.52* 1.70*  GFRNONAA 32*  --   --   GFRAA 37*  --   --     LIVER FUNCTION TESTS: Recent Labs    03/17/20 1056  ALBUMIN 4.5    Assessment and Plan: 79 year old male with history of infrarenal abdominal aortic aneurysm status post aortobiliac endovascular repair in 2013 followed by type 2 endoleak repair in 2016.  Until his most recent CTA, there has been persistent small focal endoleak present.  Sac size is stable. 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  He is clinically doing very well.  Follow up in 1 year with repeat CTA for surveillance of AAA.  Electronically Signed: Rosanne Ashing [place] 08/25/2020, 9:00 AM   I spent a total of 15 Minutes in telephone consultation, greater than 50% of which was counseling/coordinating care for endoleak  repair.

## 2020-08-27 ENCOUNTER — Other Ambulatory Visit: Payer: Self-pay | Admitting: Internal Medicine

## 2020-08-31 ENCOUNTER — Other Ambulatory Visit: Payer: Self-pay | Admitting: Internal Medicine

## 2020-10-18 ENCOUNTER — Ambulatory Visit (INDEPENDENT_AMBULATORY_CARE_PROVIDER_SITE_OTHER): Payer: Medicare Other | Admitting: Internal Medicine

## 2020-10-18 ENCOUNTER — Encounter: Payer: Self-pay | Admitting: Internal Medicine

## 2020-10-18 ENCOUNTER — Other Ambulatory Visit: Payer: Self-pay

## 2020-10-18 VITALS — BP 132/86 | HR 61 | Ht 70.0 in | Wt 204.0 lb

## 2020-10-18 DIAGNOSIS — E781 Pure hyperglyceridemia: Secondary | ICD-10-CM | POA: Diagnosis not present

## 2020-10-18 DIAGNOSIS — Z794 Long term (current) use of insulin: Secondary | ICD-10-CM | POA: Diagnosis not present

## 2020-10-18 DIAGNOSIS — E1165 Type 2 diabetes mellitus with hyperglycemia: Secondary | ICD-10-CM

## 2020-10-18 LAB — POCT GLYCOSYLATED HEMOGLOBIN (HGB A1C): Hemoglobin A1C: 6.1 % — AB (ref 4.0–5.6)

## 2020-10-18 NOTE — Progress Notes (Signed)
Name: Garrett Jordan  Age/ Sex: 79 y.o., male   MRN/ DOB: [id number], January 13, 1941     PCP: Garrett Contras, MD   Reason for Endocrinology Evaluation: Type 2 Diabetes Mellitus     Initial Endocrinology Clinic Visit:  03/17/2020    PATIENT IDENTIFIER: Garrett Jordan. is a 79 y.o. male with a past medical history of T2DM, RLS, CAD and CKD. The patient has followed with Endocrinology clinic since 03/17/2020 for consultative assistance with management of his diabetes.  DIABETIC HISTORY:  Garrett Jordan was diagnosed with T2 DM in 2019. Invokana- cost prohibitive . His hemoglobin A1c has ranged from 6.7% in 2020, peaking at 7.8 % in 2021    On his initial visit to our clinic his A1c was 6.5 % , he was on Januvia which was continued, Invokana was cost prohibitive and we switched it to Glipizide.   Januvia stopped 06/2020 due to high cost for pt  Jardiance started by nephrology by 07/2020    HYPERCALCEMIA HISTORY: Garrett Jordan indicates that he was first noted with hypercalcemia in 2020. He has hx of kidney stones  He is S/P Left parathyroidectomy in 1970's  Ca/Cr ratio 0.0210  SUBJECTIVE:   During the last visit (06/17/2020):This was virtual visit. We stopped Januvia and increased Glipizide   Today (10/18/2020): Garrett Jordan is here for a follow up on diabetes management.  He checks his blood sugars 1 times daily, preprandial to breakfast . The patient has not had hypoglycemic episodes since the last clinic visit.  No recent renal stones.  Denies polydipsia  Has noted left eye " pulsation "  ~ 4 weeks ago., since then has had 2 episodes since then. Does not feel twitching     HOME DIABETES REGIMEN:  Glipizide 5 mg, HALF a tablet BID  Jardiance 10 mg daily    Statin: yes ACE-I/ARB: yes   METER DOWNLOAD SUMMARY: 11/22-12/04/2020 Average Number Tests/Day = 1 Overall Mean FS Glucose = 147 Standard Deviation = 19  BG Ranges: Low = 100 High = 170   Hypoglycemic Events/30  Days: BG < 50 = 0 Episodes of symptomatic severe hypoglycemia = 0     DIABETIC COMPLICATIONS: Microvascular complications:   CKD  Denies: retinopathy, neuropathy   Last Eye Exam: Completed 01/2020  Macrovascular complications:   CAD  Denies: CVA, PVD       PHYSICAL EXAM: VS: BP 132/86   Pulse 61   Ht 5\' 10"  (1.778 m)   Wt 204 lb (92.5 kg)   SpO2 98%   BMI 29.27 kg/m    EXAM: General: Pt appears well and is in NAD  Lungs: Clear with good BS bilat with no rales, rhonchi, or wheezes  Heart: Auscultation: RRR  Abdomen: Normoactive bowel sounds, soft, nontender, without masses or organomegaly palpable  Extremities:  BL NL:GXQJJ edema B/L   Mental Status: Judgment, insight: intact Orientation: oriented to time, place, and person Mood and affect: no depression, anxiety, or agitation    HISTORY:  Past Medical History:  Past Medical History:  Diagnosis Date  . AAA (abdominal aortic aneurysm) ([place])   . Arthritis    knees  . Atrial fibrillation ([place])   . CAD (coronary artery disease)   . Cancer [facility])    prostate  . Diabetes mellitus without complication ([place])   . GERD (gastroesophageal reflux disease)   . Gout   . Hyperlipidemia   . Hypertension   . Hypothyroidism   . Kidney  stone    right kidney  . Myocardial infarction ([place]) 01/1991   sp inferior  . Nerve compression    right leg  . Reflux   . Seasonal allergies   . Thyroid disease    hypothyroidism   Past Surgical History:  Past Surgical History:  Procedure Laterality Date  . ABDOMINAL AORTAGRAM N/A 02/28/2012   Procedure: ABDOMINAL Maxcine Ham;  Surgeon: Serafina Mitchell, MD;  Location[facility] CATH LAB;  Service: Cardiovascular;  Laterality: N/A;  . abdominal aortagram embolization  02/28/2012  . ABDOMINAL AORTIC ANEURYSM REPAIR  Sept. 2013  . CORONARY ARTERY BYPASS GRAFT  04/2009  . [place] and  2009   RCA  . EMBOLIZATION Right 02/28/2012   Procedure: EMBOLIZATION;  Surgeon:  Serafina Mitchell, MD;  Location: [facility] CATH LAB;  Service: Cardiovascular;  Laterality: Right;  . IR GENERIC HISTORICAL  12/05/2016   IR RADIOLOGIST EVAL & MGMT 12/05/2016 Jacqulynn Cadet, MD [facility] INTERV RAD  . IR RADIOLOGIST EVAL & MGMT  07/10/2019  . IR RADIOLOGIST EVAL & MGMT  08/25/2020  . KIDNEY STONE SURGERY    . LEFT HEART CATHETERIZATION WITH CORONARY/GRAFT ANGIOGRAM N/A 01/08/2015   Procedure: LEFT HEART CATHETERIZATION WITH Beatrix Fetters;  Surgeon: Jacolyn Reedy, MD;  Location: [facility] CATH LAB;  Service: Cardiovascular;  Laterality: N/A;  . parathyroid adenoma    . PROSTATE SURGERY     rad seeds  . SPINE SURGERY    . TONSILLECTOMY      Social History:  reports that he quit smoking about 50 years ago. His smoking use included cigarettes. He has never used smokeless tobacco. He reports current alcohol use of about 6.0 - 9.0 standard drinks of alcohol per week. He reports that he does not use drugs. Family History:  Family History  Problem Relation Age of Onset  . Heart disease Father        Heart Disease before age 29  . Heart attack Father   . Hyperlipidemia Father   . Hypertension Father   . Stroke Mother   . Deep vein thrombosis Mother   . Heart disease Sister   . Diabetes Sister   . Hyperlipidemia Sister   . Hypertension Sister   . Heart attack Sister   . Heart disease Paternal Uncle   . Diabetes Maternal Grandmother   . Diabetes Sister      HOME MEDICATIONS: Allergies as of 10/18/2020      Reactions   Ace Inhibitors Cough   Crestor [rosuvastatin] Other (See Comments)   Muscle aches   Nitroglycerin Other (See Comments)   Very pronounced lowered BP with IV nitro for caths   Quinolones    Aortic root enlargement      Medication List       Accurate as of October 18, 2020 10:24 AM. If you have any questions, ask your nurse or doctor.        ACCU-CHEK AVIVA VI by In Vitro route.   Accu-Chek Guide test strip Generic drug: glucose blood 2x daily    allopurinol 300 MG tablet Commonly known as: ZYLOPRIM Take 300 mg by mouth every other day. Take in the evening - on even days of the month   amLODipine 10 MG tablet Commonly known as: NORVASC TAKE 1 TABLET BY MOUTH EVERY DAY   clonazePAM 1 MG tablet Commonly known as: KLONOPIN Take 0.5 mg by mouth at bedtime.   clopidogrel 75 MG tablet Commonly known as: PLAVIX Take 1 tablet (75  mg total) by mouth daily.   EQL CoQ10 300 MG Caps Generic drug: Coenzyme Q10 Take by mouth.   esomeprazole 20 MG capsule Commonly known as: NEXIUM Take 20 mg by mouth daily at 12 noon.   fluorouracil 5 % cream Commonly known as: EFUDEX Use as directed   fluticasone 50 MCG/ACT nasal spray Commonly known as: FLONASE Place 2 sprays into both nostrils daily as needed for allergies.   glipiZIDE 5 MG tablet Commonly known as: GLUCOTROL TAKE 1/2 TABLET BY MOUTH DAILY BEFORE BREAKFAST.   ibuprofen 200 MG tablet Commonly known as: ADVIL Take 200 mg by mouth as needed.   Jardiance 10 MG Tabs tablet Generic drug: empagliflozin Take 10 mg by mouth daily.   levothyroxine 175 MCG tablet Commonly known as: SYNTHROID Take 175 mcg by mouth daily before breakfast.   loratadine-pseudoephedrine 10-240 MG 24 hr tablet Commonly known as: CLARITIN-D 24-hour Take 1 tablet by mouth at bedtime.   metoprolol succinate 50 MG 24 hr tablet Commonly known as: TOPROL-XL Take 25 mg by mouth in the morning and at bedtime.   olmesartan-hydrochlorothiazide 40-25 MG tablet Commonly known as: BENICAR HCT Take 1 tablet by mouth daily.   PreserVision AREDS 2 Caps Take 1 capsule by mouth 2 (two) times daily.   rosuvastatin 10 MG tablet Commonly known as: CRESTOR Take 10 mg by mouth as directed. Per patient, takes Mondays & Thursdays   tamsulosin 0.4 MG Caps capsule Commonly known as: FLOMAX Take 0.4 mg by mouth every other day. Take in the evening - on even days of the month   triamcinolone 0.1 % Commonly  known as: KENALOG Apply 1 application topically as needed.   Vascepa 1 g capsule Generic drug: icosapent Ethyl Take 2 g by mouth 2 (two) times daily.   vitamin B-12 1000 MCG tablet Commonly known as: CYANOCOBALAMIN Take 2,500 mcg by mouth every other day.   Vitamin D3 50 MCG (2000 UT) Tabs Take 2,000 Units by mouth daily.       DM foot exam: 03/17/2020  The skin of the feet is intact without sores or ulcerations. The pedal pulses are 2+ on right and 2+ on left. The sensation is intact to a screening 5.07, 10 gram monofilament bilaterally   DATA REVIEWED:  Lab Results  Component Value Date   HGBA1C 6.1 (A) 10/18/2020   HGBA1C 6.5 (A) 03/17/2020   HGBA1C 6.0 (H) 01/07/2015   Lab Results  Component Value Date   LDLCALC 102 (H) 01/08/2015   CREATININE 1.70 (H) 08/19/2020         12/17/2019 A1c 7.8%  BUN/Cr 31/1.68 GFR 40 Calcium 10.9 (corrected calcium 10.41) Tg 946  HDL 25 LDL 52 Vitamin D 37.4    05/28/2020  LDL 63 Tg 298   08/19/2020 BUN/Cr 32/1.7    ASSESSMENT / PLAN / RECOMMENDATIONS:   1) Type 2 Diabetes Mellitus, Optimally controlled, With CKD III complications - Most recent A1c of 6.1 %. Goal A1c < 7.0%.   - His A1c is down from 6.5 %, there's no evidence of hypoglycemia on meter download, nor does he endorse symptoms of hypoglycemia - The average Bg on his meter download is 147 mg/dL which is ~ A1c of 7.6 %  - A1c could be skewed due to CKD  - He has been stared on Jardiance through nephrology, he is concerned about the high cost.We discussed cardiovascular and renal benefits, I have directed him to discuss with nephrology about pricing issues.  - We stopped  Januvia due to cost in the past    MEDICATIONS:   Continue  Glipizide 5 mg, half a tablet before Breakfast and Supper   Continue Jardiance 10 mg daily     EDUCATION / INSTRUCTIONS:  BG monitoring instructions: Patient is instructed to check his blood sugars 2 times a day, before  breakfast and supper .  Call [facility] if: BG persistently < 70  . I reviewed the Rule of 15 for the treatment of hypoglycemia in detail with the patient. Literature supplied.     2) Diabetic complications:   Eye: Does not have known diabetic retinopathy.   Neuro/ Feet: Does not have known diabetic peripheral neuropathy .   Renal: Patient does  have known baseline CKD. He   is on an ACEI/ARB at present.    3) Hypertriglyceridemia: Patient is on rosuvastatin twice a week due to myalgia. He is on  Vascepa as well started in 2021.  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  - He had labs in 08/2020 but I do not have these records. Pt may qualify for parathyroidectomy again should his calcium remain elevated.  - Pt will obtain these records      F/U in 4 months    Signed electronically by: Mack Guise, MD  [facility]  [facility] [address] [place], [place] [address] Phone: [phone] FAX: [phone]   CC: Garrett Jordan, [place] [address] Phone: [phone]  Fax: [phone]  Return to Endocrinology clinic as below: Future Appointments  Date Time Provider [place]  01/12/2021  9:40 AM Jerline Pain, MD [facility]  04/13/2021  9:10 AM Toneisha Savary, Melanie Crazier, MD [facility] None

## 2020-10-18 NOTE — Patient Instructions (Addendum)
-   Continue Glipizide 5 mg , HALF a tablet before Breakfast and Supper - Continue jardiance 10 mg , 1 tablet daily       HOW TO TREAT LOW BLOOD SUGARS (Blood sugar LESS THAN 70 MG/DL)  Please follow the RULE OF 15 for the treatment of hypoglycemia treatment (when your (blood sugars are less than 70 mg/dL)    STEP 1: Take 15 grams of carbohydrates when your blood sugar is low, which includes:   3-4 GLUCOSE TABS  OR  3-4 OZ OF JUICE OR REGULAR SODA OR  ONE TUBE OF GLUCOSE GEL     STEP 2: RECHECK blood sugar in 15 MINUTES STEP 3: If your blood sugar is still low at the 15 minute recheck --> then, go back to STEP 1 and treat AGAIN with another 15 grams of carbohydrates.

## 2020-11-24 ENCOUNTER — Other Ambulatory Visit: Payer: Self-pay | Admitting: Cardiology

## 2020-11-25 DIAGNOSIS — M5416 Radiculopathy, lumbar region: Secondary | ICD-10-CM | POA: Diagnosis not present

## 2020-11-25 DIAGNOSIS — M5412 Radiculopathy, cervical region: Secondary | ICD-10-CM | POA: Diagnosis not present

## 2020-11-30 DIAGNOSIS — N1832 Chronic kidney disease, stage 3b: Secondary | ICD-10-CM | POA: Diagnosis not present

## 2020-12-09 DIAGNOSIS — M5412 Radiculopathy, cervical region: Secondary | ICD-10-CM | POA: Diagnosis not present

## 2020-12-09 DIAGNOSIS — M5416 Radiculopathy, lumbar region: Secondary | ICD-10-CM | POA: Diagnosis not present

## 2020-12-10 DIAGNOSIS — N1832 Chronic kidney disease, stage 3b: Secondary | ICD-10-CM | POA: Diagnosis not present

## 2020-12-10 DIAGNOSIS — N281 Cyst of kidney, acquired: Secondary | ICD-10-CM | POA: Diagnosis not present

## 2020-12-10 DIAGNOSIS — N200 Calculus of kidney: Secondary | ICD-10-CM | POA: Diagnosis not present

## 2020-12-10 DIAGNOSIS — I129 Hypertensive chronic kidney disease with stage 1 through stage 4 chronic kidney disease, or unspecified chronic kidney disease: Secondary | ICD-10-CM | POA: Diagnosis not present

## 2020-12-10 DIAGNOSIS — E892 Postprocedural hypoparathyroidism: Secondary | ICD-10-CM | POA: Diagnosis not present

## 2020-12-16 DIAGNOSIS — Z7189 Other specified counseling: Secondary | ICD-10-CM | POA: Diagnosis not present

## 2020-12-16 DIAGNOSIS — E1122 Type 2 diabetes mellitus with diabetic chronic kidney disease: Secondary | ICD-10-CM | POA: Diagnosis not present

## 2020-12-16 DIAGNOSIS — I10 Essential (primary) hypertension: Secondary | ICD-10-CM | POA: Diagnosis not present

## 2020-12-16 DIAGNOSIS — E559 Vitamin D deficiency, unspecified: Secondary | ICD-10-CM | POA: Diagnosis not present

## 2020-12-16 DIAGNOSIS — E039 Hypothyroidism, unspecified: Secondary | ICD-10-CM | POA: Diagnosis not present

## 2020-12-16 DIAGNOSIS — Z0000 Encounter for general adult medical examination without abnormal findings: Secondary | ICD-10-CM | POA: Diagnosis not present

## 2020-12-16 DIAGNOSIS — M542 Cervicalgia: Secondary | ICD-10-CM | POA: Diagnosis not present

## 2020-12-16 DIAGNOSIS — Z7984 Long term (current) use of oral hypoglycemic drugs: Secondary | ICD-10-CM | POA: Diagnosis not present

## 2020-12-16 DIAGNOSIS — E782 Mixed hyperlipidemia: Secondary | ICD-10-CM | POA: Diagnosis not present

## 2020-12-16 DIAGNOSIS — E538 Deficiency of other specified B group vitamins: Secondary | ICD-10-CM | POA: Diagnosis not present

## 2020-12-16 DIAGNOSIS — Z1389 Encounter for screening for other disorder: Secondary | ICD-10-CM | POA: Diagnosis not present

## 2020-12-16 DIAGNOSIS — N184 Chronic kidney disease, stage 4 (severe): Secondary | ICD-10-CM | POA: Diagnosis not present

## 2020-12-16 DIAGNOSIS — M109 Gout, unspecified: Secondary | ICD-10-CM | POA: Diagnosis not present

## 2020-12-22 ENCOUNTER — Telehealth: Payer: Self-pay

## 2020-12-22 DIAGNOSIS — M961 Postlaminectomy syndrome, not elsewhere classified: Secondary | ICD-10-CM | POA: Diagnosis not present

## 2020-12-22 DIAGNOSIS — M5412 Radiculopathy, cervical region: Secondary | ICD-10-CM | POA: Diagnosis not present

## 2020-12-22 DIAGNOSIS — M5416 Radiculopathy, lumbar region: Secondary | ICD-10-CM | POA: Diagnosis not present

## 2020-12-22 NOTE — Telephone Encounter (Signed)
   [facility] Pre-operative Risk Assessment    HEARTCARE STAFF: - Please ensure there is not already an duplicate clearance open for this procedure. - Under Visit Info/Reason for Call, type in Other and utilize the format Clearance MM/DD/YY or Clearance TBD. Do not use dashes or single digits. - If request is for dental extraction, please clarify the # of teeth to be extracted.  Request for surgical clearance:  1. What type of surgery is being performed? CAUDAL ESL INJECTION   2. When is this surgery scheduled? 01/06/21  3. What type of clearance is required (medical clearance vs. Pharmacy clearance to hold med vs. Both)? PHARMACY  4. Are there any medications that need to be held prior to surgery and how long? PLAVIX  X 5DAYS  5. Practice name and name of physician performing surgery? [facility]   6. What is the office phone number? [phone]   7.   What is the office fax number? [phone]  8.   Anesthesia type (None, local, MAC, general) ? NONE LISTED   Jacinta Shoe 12/22/2020, 2:18 PM  _________________________________________________________________   (provider comments below)

## 2020-12-22 NOTE — Telephone Encounter (Signed)
Dr Marlou Porch we need clearance to hold Plavix in this patient prior to spinal injection scheduled for 2/24.  Please respond to CV DIV PRE OP  thanks  Kerin Ransom PA-C 12/22/2020 2:31 PM

## 2020-12-24 NOTE — Telephone Encounter (Signed)
Hey may hold plavix and ASA for 5 days prior to surgery and proceed. Low risk of heart attack while holding these meds. Candee Furbish, MD

## 2020-12-27 NOTE — Telephone Encounter (Addendum)
   Primary Cardiologist: Candee Furbish, MD  Chart reviewed as part of pre-operative protocol coverage.   See comments below from Dr. Marlou Porch.    PLAN: The patient may hold Clopidogrel (Plavix) for 5 days prior to his procedure.  Please resume as soon as possible after his procedure.  Please call with questions.  Note removed from preop pool. Richardson Dopp, PA-C 12/27/2020, 8:46 AM

## 2021-01-06 DIAGNOSIS — M5416 Radiculopathy, lumbar region: Secondary | ICD-10-CM | POA: Diagnosis not present

## 2021-01-12 ENCOUNTER — Encounter: Payer: Self-pay | Admitting: Cardiology

## 2021-01-12 ENCOUNTER — Ambulatory Visit: Payer: Medicare Other | Admitting: Cardiology

## 2021-01-12 ENCOUNTER — Other Ambulatory Visit: Payer: Self-pay

## 2021-01-12 VITALS — BP 120/70 | HR 72 | Ht 70.0 in | Wt 202.0 lb

## 2021-01-12 DIAGNOSIS — Z79899 Other long term (current) drug therapy: Secondary | ICD-10-CM | POA: Diagnosis not present

## 2021-01-12 DIAGNOSIS — E782 Mixed hyperlipidemia: Secondary | ICD-10-CM

## 2021-01-12 DIAGNOSIS — I2510 Atherosclerotic heart disease of native coronary artery without angina pectoris: Secondary | ICD-10-CM

## 2021-01-12 DIAGNOSIS — Z951 Presence of aortocoronary bypass graft: Secondary | ICD-10-CM | POA: Diagnosis not present

## 2021-01-12 MED ORDER — METOPROLOL TARTRATE 25 MG PO TABS: 25.0000 mg | ORAL_TABLET | Freq: Two times a day (BID) | ORAL | 1 refills | Status: DC

## 2021-01-12 NOTE — Patient Instructions (Signed)
Medication Instructions:   STOP TAKING METOPROLOL SUCCINATE (TOPROL XL) NOW  START TAKING METOPROLOL TARTRATE 25 MG BY MOUTH TWICE DAILY  *If you need a refill on your cardiac medications before your next appointment, please call your pharmacy*   You have been referred to [place]: At [facility], you and your health needs are our priority.  As part of our continuing mission to provide you with exceptional heart care, we have created designated Provider Care Teams.  These Care Teams include your primary Cardiologist (physician) and Advanced Practice Providers (APPs -  Physician Assistants and Nurse Practitioners) who all work together to provide you with the care you need, when you need it.  We recommend signing up for the patient portal called "MyChart".  Sign up information is provided on this After Visit Summary.  MyChart is used to connect with patients for Virtual Visits (Telemedicine).  Patients are able to view lab/test results, encounter notes, upcoming appointments, etc.  Non-urgent messages can be sent to your provider as well.   To learn more about what you can do with MyChart, go to [email or web address].    Your next appointment:   6 month(s)  The format for your next appointment:   In Person  Provider:   Candee Furbish, MD

## 2021-01-12 NOTE — Progress Notes (Signed)
Cardiology Office Note:    Date:  01/12/2021   ID:  Garrett Meek Sr., DOB Mar 16, 1941, MRN [id number]  PCP:  Antony Contras, MD   [place]  Cardiologist:  Candee Furbish, MD  Advanced Practice Provider:  No care team member to display Electrophysiologist:  None       Referring MD: Antony Contras, MD    History of Present Illness:    Garrett Schmid. is a 80 y.o. male here for follow-up of chest pain, epigastric pain.  Sharp pains with cough, right side under breast. ? Gall bladder 3-4 weeks. After eating.  He will discuss further with Dr. Moreen Fowler.  Prior CABG in 2010 AAA stent graft as well as Dr. 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  Dr. Trula Slade has been following.  Lipid panel recently total cholesterol 134 HDL 30 triglycerides 435 LDL 41.  He is on Crestor Monday and Thursday and he is on Vascepa for the past year or so.    Past Medical History:  Diagnosis Date  . AAA (abdominal aortic aneurysm) ([place])   . Arthritis    knees  . Atrial fibrillation ([place])   . CAD (coronary artery disease)   . Cancer [facility])    prostate  . Diabetes mellitus without complication ([place])   . GERD (gastroesophageal reflux disease)   . Gout   . Hyperlipidemia   . Hypertension   . Hypothyroidism   . Kidney stone    right kidney  . Myocardial infarction ([place]) 01/1991   sp inferior  . Nerve compression    right leg  . Reflux   . Seasonal allergies   . Thyroid disease    hypothyroidism    Past Surgical History:  Procedure Laterality Date  . ABDOMINAL AORTAGRAM N/A 02/28/2012   Procedure: ABDOMINAL Maxcine Ham;  Surgeon: Serafina Mitchell, MD;  Location: [facility] CATH LAB;  Service: Cardiovascular;  Laterality: N/A;  . abdominal aortagram embolization  02/28/2012  . ABDOMINAL AORTIC ANEURYSM REPAIR  Sept. 2013  . CORONARY ARTERY BYPASS GRAFT  04/2009  . [place] and  2009   RCA  . EMBOLIZATION Right  02/28/2012   Procedure: EMBOLIZATION;  Surgeon: Serafina Mitchell, MD;  Location: [facility] CATH LAB;  Service: Cardiovascular;  Laterality: Right;  . IR GENERIC HISTORICAL  12/05/2016   IR RADIOLOGIST EVAL & MGMT 12/05/2016 Jacqulynn Cadet, MD [facility] INTERV RAD  . IR RADIOLOGIST EVAL & MGMT  07/10/2019  . IR RADIOLOGIST EVAL & MGMT  08/25/2020  . KIDNEY STONE SURGERY    . LEFT HEART CATHETERIZATION WITH CORONARY/GRAFT ANGIOGRAM N/A 01/08/2015   Procedure: LEFT HEART CATHETERIZATION WITH Beatrix Fetters;  Surgeon: Jacolyn Reedy, MD;  Location: [facility] CATH LAB;  Service: Cardiovascular;  Laterality: N/A;  . parathyroid adenoma    . PROSTATE SURGERY     rad seeds  . SPINE SURGERY    . TONSILLECTOMY      Current Medications: Current Meds  Medication Sig  . allopurinol (ZYLOPRIM) 300 MG tablet Take 300 mg by mouth every other day. Take in the evening - on even days of the month  . amLODipine (NORVASC) 10 MG tablet TAKE 1 TABLET BY MOUTH EVERY DAY  . Cholecalciferol (VITAMIN D3) 2000 UNITS TABS Take 2,000 Units by mouth daily.   . clonazePAM (KLONOPIN) 1 MG tablet Take 0.5 mg by mouth at bedtime.  . clopidogrel (PLAVIX) 75 MG  tablet TAKE 1 TABLET BY MOUTH  DAILY  . Coenzyme Q10 300 MG CAPS Take by mouth.  . esomeprazole (NEXIUM) 20 MG capsule Take 20 mg by mouth daily at 12 noon.  Marland Kitchen FARXIGA 10 MG TABS tablet Take 10 mg by mouth daily.  . fluorouracil (EFUDEX) 5 % cream Use as directed  . fluticasone (FLONASE) 50 MCG/ACT nasal spray Place 2 sprays into both nostrils daily as needed for allergies.  Marland Kitchen glipiZIDE (GLUCOTROL) 5 MG tablet TAKE 1/2 TABLET BY MOUTH DAILY BEFORE BREAKFAST.  Marland Kitchen glucose blood (ACCU-CHEK GUIDE) test strip 2x daily  . ibuprofen (ADVIL,MOTRIN) 200 MG tablet Take 200 mg by mouth as needed.  Marland Kitchen levothyroxine (SYNTHROID, LEVOTHROID) 175 MCG tablet Take 175 mcg by mouth daily before breakfast.  . loratadine-pseudoephedrine (CLARITIN-D 24-HOUR) 10-240 MG per 24 hr tablet Take 1  tablet by mouth at bedtime.  . metoprolol tartrate (LOPRESSOR) 25 MG tablet Take 1 tablet (25 mg total) by mouth 2 (two) times daily.  . Multiple Vitamins-Minerals (PRESERVISION AREDS 2) CAPS Take 1 capsule by mouth 2 (two) times daily.  Marland Kitchen olmesartan-hydrochlorothiazide (BENICAR HCT) 40-25 MG tablet Take 1 tablet by mouth daily.  . rosuvastatin (CRESTOR) 10 MG tablet Take 10 mg by mouth as directed. Per patient, takes Mondays & Thursdays  . Tamsulosin HCl (FLOMAX) 0.4 MG CAPS Take 0.4 mg by mouth every other day. Take in the evening - on even days of the month  . triamcinolone cream (KENALOG) 0.1 % Apply 1 application topically as needed.  Marland Kitchen VASCEPA 1 g capsule Take 2 g by mouth 2 (two) times daily.  . vitamin B-12 (CYANOCOBALAMIN) 1000 MCG tablet Take 2,500 mcg by mouth every other day.   . [DISCONTINUED] metoprolol succinate (TOPROL-XL) 50 MG 24 hr tablet Take 25 mg by mouth in the morning and at bedtime.     Allergies:   Ace inhibitors, Crestor [rosuvastatin], Nitroglycerin, and Quinolones   Social History   Socioeconomic History  . Marital status: Divorced    Spouse name: Not on file  . Number of children: Not on file  . Years of education: Not on file  . Highest education level: Not on file  Occupational History  . Not on file  Tobacco Use  . Smoking status: Former Smoker    Types: Cigarettes    Quit date: 08/01/1970    Years since quitting: 50.4  . Smokeless tobacco: Never Used  Vaping Use  . Vaping Use: Never used  Substance and Sexual Activity  . Alcohol use: Yes    Alcohol/week: 6.0 - 9.0 standard drinks    Types: 4 - 5 Glasses of wine, 2 - 4 Cans of beer per week    Comment: 1-3 glasses 3-4 times a week  . Drug use: No  . Sexual activity: Not on file  Other Topics Concern  . Not on file  Social History Narrative  . Not on file   Social Determinants of Health   Financial Resource Strain: Not on file  Food Insecurity: Not on file  Transportation Needs: Not on  file  Physical Activity: Not on file  Stress: Not on file  Social Connections: Not on file     Family History: The patient's family history includes Deep vein thrombosis in his mother; Diabetes in his maternal grandmother, sister, and sister; Heart attack in his father and sister; Heart disease in his father, paternal uncle, and sister; Hyperlipidemia in his father and sister; Hypertension in his father and sister; Stroke in  his mother.  ROS:   Please see the history of present illness.     All other systems reviewed and are negative.  EKGs/Labs/Other Studies Reviewed:    The following studies were reviewed today:  NUC 03/10/20   The left ventricular ejection fraction is mildly decreased (45-54%).  Nuclear stress EF: 50%.  No T wave inversion was noted during stress.  There was no ST segment deviation noted during stress.  Defect 1: There is a medium defect of mild severity present in the basal inferior and mid inferior location.  This is a low risk study.  Medium size, mild intensity basal to mid inferior perfusion defect which is mostly fixed, suggestive of infarct without significant ischemia. This correlates with known occlusion of the SVG to PDA. No other significant reversible ischemia. LVEF 50% with basal to mid inferior hypokinesis. This is a low risk study.   Cardiac catheterization 01/08/2015: 1. Severe native three-vessel coronary artery disease 2. Occlusion of the saphenous vein graft to very small posterior descending artery with stenosis in the right coronary artery graft prior to that in the place of a previous stent, the other grafts are widely patent 3. Preserved LV function with inferior hypokinesis 4. Moderately severe stenosis at the distal portion of a small graft to the right ventricular branch   Recent Labs: 03/17/2020: BUN 31; Potassium 4.0; Sodium 134 08/19/2020: Creatinine, Ser 1.70  Recent Lipid Panel    Component Value Date/Time   CHOL 195  01/08/2015 0404   TRIG 318 (H) 01/08/2015 0404   HDL 29 (L) 01/08/2015 0404   CHOLHDL 6.7 01/08/2015 0404   VLDL 64 (H) 01/08/2015 0404   LDLCALC 102 (H) 01/08/2015 0404     Risk Assessment/Calculations:      Physical Exam:    VS:  BP 120/70 (BP Location: Left Arm, Patient Position: Sitting, Cuff Size: Normal)   Pulse 72   Ht 5\' 10"  (1.778 m)   Wt 202 lb (91.6 kg)   SpO2 96%   BMI 28.98 kg/m     Wt Readings from Last 3 Encounters:  01/12/21 202 lb (91.6 kg)  10/18/20 204 lb (92.5 kg)  08/03/20 195 lb (88.5 kg)     GEN:  Well nourished, well developed in no acute distress HEENT: Normal NECK: No JVD; No carotid bruits LYMPHATICS: No lymphadenopathy CARDIAC: RRR, no murmurs, rubs, gallops RESPIRATORY:  Clear to auscultation without rales, wheezing or rhonchi  ABDOMEN: Soft, non-tender, non-distended MUSCULOSKELETAL:  No edema; No deformity  SKIN: Warm and dry NEUROLOGIC:  Alert and oriented x 3 PSYCHIATRIC:  Normal affect   ASSESSMENT:    1. Coronary artery disease involving native coronary artery of native heart without angina pectoris   2. S/P CABG (coronary artery bypass graft)   3. Medication management   4. Mixed hyperlipidemia    PLAN:    In order of problems listed above:  Coronary artery disease CABG 2010 -Prior symptoms likely musculoskeletal -Recent CT scan of chest 03/03/2020 reviewed, nuclear stress test reviewed as well both reassuring.  Abdominal aortic aneurysm stent graft repair -[place] Dr. Trula Slade.  Mixed hyperlipidemia -Lets have him set up with the lipid clinic here to talk to our pharmacy team.  He is currently on Vascepa as well as intermittently dosed Crestor.  Triglycerides are still 435.  See above.  Essential hypertension -He requested that we change his metoprolol over to 25 mg twice a day so he does not have to split his tablet.  We will do  this and prescribe metoprolol tartrate instead of succinate.  Diabetes with  hypertension -Hemoglobin A1c 6.1.  Now on Farxiga.  Hemoglobin 15.9 creatinine 1.58 potassium 4.2     Medication Adjustments/Labs and Tests Ordered: Current medicines are reviewed at length with the patient today.  Concerns regarding medicines are outlined above.  Orders Placed This Encounter  Procedures  . AMB Referral to [facility]   Meds ordered this encounter  Medications  . metoprolol tartrate (LOPRESSOR) 25 MG tablet    Sig: Take 1 tablet (25 mg total) by mouth 2 (two) times daily.    Dispense:  180 tablet    Refill:  1    Patient Instructions  Medication Instructions:   STOP TAKING METOPROLOL SUCCINATE (TOPROL XL) NOW  START TAKING METOPROLOL TARTRATE 25 MG BY MOUTH TWICE DAILY  *If you need a refill on your cardiac medications before your next appointment, please call your pharmacy*   You have been referred to [place]: At [facility], you and your health needs are our priority.  As part of our continuing mission to provide you with exceptional heart care, we have created designated Provider Care Teams.  These Care Teams include your primary Cardiologist (physician) and Advanced Practice Providers (APPs -  Physician Assistants and Nurse Practitioners) who all work together to provide you with the care you need, when you need it.  We recommend signing up for the patient portal called "MyChart".  Sign up information is provided on this After Visit Summary.  MyChart is used to connect with patients for Virtual Visits (Telemedicine).  Patients are able to view lab/test results, encounter notes, upcoming appointments, etc.  Non-urgent messages can be sent to your provider as well.   To learn more about what you can do with MyChart, go to [email or web address].    Your next appointment:   6 month(s)  The format for your next appointment:   In Person  Provider:   Candee Furbish, MD         Signed, Candee Furbish, MD  01/12/2021 10:44 AM    [place]

## 2021-01-13 DIAGNOSIS — E559 Vitamin D deficiency, unspecified: Secondary | ICD-10-CM | POA: Diagnosis not present

## 2021-01-13 DIAGNOSIS — M545 Low back pain, unspecified: Secondary | ICD-10-CM | POA: Diagnosis not present

## 2021-01-13 DIAGNOSIS — G8929 Other chronic pain: Secondary | ICD-10-CM | POA: Diagnosis not present

## 2021-01-13 DIAGNOSIS — Z1159 Encounter for screening for other viral diseases: Secondary | ICD-10-CM | POA: Diagnosis not present

## 2021-01-13 DIAGNOSIS — M25512 Pain in left shoulder: Secondary | ICD-10-CM | POA: Diagnosis not present

## 2021-01-13 DIAGNOSIS — Z79899 Other long term (current) drug therapy: Secondary | ICD-10-CM | POA: Diagnosis not present

## 2021-01-13 DIAGNOSIS — M129 Arthropathy, unspecified: Secondary | ICD-10-CM | POA: Diagnosis not present

## 2021-01-13 DIAGNOSIS — M542 Cervicalgia: Secondary | ICD-10-CM | POA: Diagnosis not present

## 2021-01-13 DIAGNOSIS — M25511 Pain in right shoulder: Secondary | ICD-10-CM | POA: Diagnosis not present

## 2021-01-13 DIAGNOSIS — Z9181 History of falling: Secondary | ICD-10-CM | POA: Diagnosis not present

## 2021-01-20 ENCOUNTER — Other Ambulatory Visit: Payer: Self-pay | Admitting: Cardiothoracic Surgery

## 2021-01-20 DIAGNOSIS — Z951 Presence of aortocoronary bypass graft: Secondary | ICD-10-CM

## 2021-01-20 NOTE — Progress Notes (Signed)
Patient ID: Garrett Jordan                 DOB: [birth year]                    MRN: [id number]     HPI: Shunsuke Granzow. is a 80 y.o. male patient referred to lipid clinic by Dr. Marlou Porch. PMH is significant for AAA stent graft repair, CAD, diabetes, gout, HLD, HTN, MI, CABG 2010, CKD. At last visit with Dr. Marlou Porch on 01/12/21, triglycerides were 435 despite rosuvastatin and Vascepa therapy.   Today, patient arrives for appointment bringing medication list and documentation of prior lipid panels dating back to 2020. Of note, after starting Vascepa in 12/2019, triglycerides dropped from 946 to 298, however have trended back up to 435. Patient thinks this is likely due to dietary indiscretions and exercising less than he used to. His A1c is currently controlled at 6.1%. Reports adherence with rosuvastatin and Vascepa but complains of high cost with Vascepa. Otherwise is tolerating both of these medications well. Vascepa is a tier 4 medication that costs $100 per 30 day supply/$290 per 90 day supply.   In reviewing patient's medication list, noticed that he is taking both Plavix and Nexium. He states that his acid reflux used to be so severe that he felt like he was having a heart attack and this caused him great stress and panic attacks. This has greatly improved on Nexium and he does not want to switch PPIs unless it is at his cardiologist's recommendation.   Current Medications: Rosuvastatin 10 mg on Mondays and Thursdays, Vascepa 2g BID  Intolerances: Rosuvastatin 10 mg every other day (muscle pain). Niacin also listed in past medications but patient does not recall taking  Risk Factors: CAD, CABG, DM, HLD, HTN, age, former smoker  LDL goal: <55 mg/dL  Diet:  -Breakfast: Butter biscuit and jelly, coffee with cream/sweetener -Lunch: PB&J sandwich, hotdog -Dinner: Hamburgers, hotdogs, soup, some vegetables, salads -Snacks: Snacks in the evening; nuts (almonds), cookie, or chips and dip -Drinks:  water, 1 glass of wine or beer about 3-4 times per week  Exercise: Walks 2 miles at the [employer] 2-3 times each week, yard work  Family History: Deep vein thrombosis in his mother; Diabetes in his maternal grandmother, sister, and sister; Heart attack in his father and sister; Heart disease in his father, paternal uncle, and sister; Hyperlipidemia in his father and sister; Hypertension in his father and sister; Stroke in his mother.  Social History: Former smoker (quit 1971), alcohol use (drinks 1 glass of wine or beer 3-4 times per week)  Labs: 12/16/20: TC 134, HDL 30, TG 435, LDL 41 (on rosuvastatin 10 mg M/Th and Vascepa 2g BID); Scr 1.58 (CrCl 49 mL/min) 05/18/20: TC 140, HDL 29, TG 298, LDL 63 (started Vascepa 12/2019) 12/17/19: TC 190, HDL 25, TG 946 06/12/19: TC 193, HDL 30, TG 682   Past Medical History:  Diagnosis Date   AAA (abdominal aortic aneurysm) ([facility])    Arthritis    knees   Atrial fibrillation ([facility])    CAD (coronary artery disease)    Cancer ([facility])    prostate   Diabetes mellitus without complication ([place])    GERD (gastroesophageal reflux disease)    Gout    Hyperlipidemia    Hypertension    Hypothyroidism    Kidney stone    right kidney   Myocardial infarction ([place]) 01/1991   sp inferior   Nerve compression  right leg   Reflux    Seasonal allergies    Thyroid disease    hypothyroidism    Current Outpatient Medications on File Prior to Visit  Medication Sig Dispense Refill   allopurinol (ZYLOPRIM) 300 MG tablet Take 300 mg by mouth every other day. Take in the evening - on even days of the month     amLODipine (NORVASC) 10 MG tablet TAKE 1 TABLET BY MOUTH EVERY DAY 90 tablet 2   Cholecalciferol (VITAMIN D3) 2000 UNITS TABS Take 2,000 Units by mouth daily.      clonazePAM (KLONOPIN) 1 MG tablet Take 0.5 mg by mouth at bedtime.     clopidogrel (PLAVIX) 75 MG tablet TAKE 1 TABLET BY MOUTH  DAILY 90 tablet 2   Coenzyme Q10 300 MG CAPS Take by  mouth.     esomeprazole (NEXIUM) 20 MG capsule Take 20 mg by mouth daily at 12 noon.     FARXIGA 10 MG TABS tablet Take 10 mg by mouth daily.     fluorouracil (EFUDEX) 5 % cream Use as directed     fluticasone (FLONASE) 50 MCG/ACT nasal spray Place 2 sprays into both nostrils daily as needed for allergies.     glipiZIDE (GLUCOTROL) 5 MG tablet TAKE 1/2 TABLET BY MOUTH DAILY BEFORE BREAKFAST. 45 tablet 1   glucose blood (ACCU-CHEK GUIDE) test strip 2x daily 100 each 12   ibuprofen (ADVIL,MOTRIN) 200 MG tablet Take 200 mg by mouth as needed.     levothyroxine (SYNTHROID, LEVOTHROID) 175 MCG tablet Take 175 mcg by mouth daily before breakfast.     loratadine-pseudoephedrine (CLARITIN-D 24-HOUR) 10-240 MG per 24 hr tablet Take 1 tablet by mouth at bedtime.     metoprolol tartrate (LOPRESSOR) 25 MG tablet Take 1 tablet (25 mg total) by mouth 2 (two) times daily. 180 tablet 1   Multiple Vitamins-Minerals (PRESERVISION AREDS 2) CAPS Take 1 capsule by mouth 2 (two) times daily.     olmesartan-hydrochlorothiazide (BENICAR HCT) 40-25 MG tablet Take 1 tablet by mouth daily. 90 tablet 2   rosuvastatin (CRESTOR) 10 MG tablet Take 10 mg by mouth as directed. Per patient, takes Mondays & Thursdays     Tamsulosin HCl (FLOMAX) 0.4 MG CAPS Take 0.4 mg by mouth every other day. Take in the evening - on even days of the month     triamcinolone cream (KENALOG) 0.1 % Apply 1 application topically as needed.  2   VASCEPA 1 g capsule Take 2 g by mouth 2 (two) times daily.     vitamin B-12 (CYANOCOBALAMIN) 1000 MCG tablet Take 2,500 mcg by mouth every other day.      No current facility-administered medications on file prior to visit.    Allergies  Allergen Reactions   Ace Inhibitors Cough   Crestor [Rosuvastatin] Other (See Comments)    Muscle aches   Nitroglycerin Other (See Comments)    Very pronounced lowered BP with IV nitro for caths   Quinolones     Aortic root enlargement     Assessment/Plan:  1. Hyperlipidemia - Triglycerides elevated above goal <150 mg/dL. Due to reported adherence on rosuvastatin and Vascepa, patient requires additional triglyceride lowering therapy. Will initiate fenofibrate 48 mg daily (lower dose due to renal function), continue rosuvastatin 10 mg on Mondays and Thursdays and Vascepa 2 g BID. Applied patient for [employer] which he is now approved for and should bring the cost of his Vascepa from $100/month to $0. Educated patient on lifestyle modifications  to lower triglycerides including reducing intake of alcohol, sugars, and carbs and increasing exercise. Encouraged patient to continue current blood glucose control to also help lower triglycerides. Provided patient with chart based on current triglyceride level with specific recommendations for a variety of lifestyle modifications. Follow up fasting lipid panel and CMET in 3 months.   2. Drug-drug interaction - Patient is currently taking Nexium daily which interacts with Plavix by lowering the efficacy of Plavix. At patient's request, will contact Dr. Marlou Porch for his opinion on this drug interaction. Recommend switching Nexium to Protonix to avoid this interaction.   Rebbeca Paul, PharmD PGY1 Pharmacy Resident 01/21/2021 11:26 AM

## 2021-01-21 ENCOUNTER — Ambulatory Visit (INDEPENDENT_AMBULATORY_CARE_PROVIDER_SITE_OTHER): Payer: Medicare Other | Admitting: Student-PharmD

## 2021-01-21 ENCOUNTER — Other Ambulatory Visit: Payer: Self-pay

## 2021-01-21 DIAGNOSIS — E782 Mixed hyperlipidemia: Secondary | ICD-10-CM | POA: Diagnosis not present

## 2021-01-21 MED ORDER — FENOFIBRATE 48 MG PO TABS: 48.0000 mg | ORAL_TABLET | Freq: Every day | ORAL | 3 refills | Status: DC

## 2021-01-21 NOTE — Patient Instructions (Addendum)
It was nice meeting you today!  Your triglycerides are elevated to 435 (your goal is less than 150).  Start taking fenofibrate 48 mg by mouth daily.  Continue rosuvastatin 10 mg on Mondays and Thursdays Continue Vascepa 2g twice daily   You are approved for a grant called the [employer] that should reduce the cost of your Vascepa to $0.   Increase your exercise and reduce carbs and sugars in your diet (see handout for specific recommendations).   Return for fasting lab work on June 7th anytime after 7:30am  Call [phone] if you have any questions!  Recommend switching Nexium to Protonix to avoid interaction with Plavix.

## 2021-01-24 ENCOUNTER — Telehealth: Payer: Self-pay | Admitting: Student-PharmD

## 2021-01-24 MED ORDER — PANTOPRAZOLE SODIUM 40 MG PO TBEC: 40.0000 mg | DELAYED_RELEASE_TABLET | Freq: Every day | ORAL | 1 refills | Status: DC

## 2021-01-24 NOTE — Telephone Encounter (Signed)
Called patient regarding drug interaction with Nexium and Plavix (reduced efficacy of Plavix) that was discussed at last visit on 3/11. Patient wanted [name] to reach out to Dr. Marlou Porch for his opinion prior to switching Nexium to Protonix, which would avoid the drug interaction with Plavix. Dr. Marlou Porch agrees with this recommendation. Discussed this with the patient who is agreeable to switch Nexium to Protonix. Will send in Rx for Protonix to [facility].

## 2021-01-27 DIAGNOSIS — Z961 Presence of intraocular lens: Secondary | ICD-10-CM | POA: Diagnosis not present

## 2021-01-27 DIAGNOSIS — H52203 Unspecified astigmatism, bilateral: Secondary | ICD-10-CM | POA: Diagnosis not present

## 2021-01-27 DIAGNOSIS — H524 Presbyopia: Secondary | ICD-10-CM | POA: Diagnosis not present

## 2021-01-27 DIAGNOSIS — G8929 Other chronic pain: Secondary | ICD-10-CM | POA: Diagnosis not present

## 2021-01-27 DIAGNOSIS — M25511 Pain in right shoulder: Secondary | ICD-10-CM | POA: Diagnosis not present

## 2021-01-27 DIAGNOSIS — M19011 Primary osteoarthritis, right shoulder: Secondary | ICD-10-CM | POA: Diagnosis not present

## 2021-01-27 DIAGNOSIS — M25512 Pain in left shoulder: Secondary | ICD-10-CM | POA: Diagnosis not present

## 2021-01-27 DIAGNOSIS — E119 Type 2 diabetes mellitus without complications: Secondary | ICD-10-CM | POA: Diagnosis not present

## 2021-01-27 DIAGNOSIS — Z7984 Long term (current) use of oral hypoglycemic drugs: Secondary | ICD-10-CM | POA: Diagnosis not present

## 2021-03-10 ENCOUNTER — Ambulatory Visit
Admission: RE | Admit: 2021-03-10 | Discharge: 2021-03-10 | Disposition: A | Discharge status: Home / Self Care | Payer: Medicare Other | Source: Ambulatory Visit | Attending: Cardiothoracic Surgery | Admitting: Cardiothoracic Surgery

## 2021-03-10 ENCOUNTER — Ambulatory Visit: Payer: Medicare Other | Admitting: Cardiothoracic Surgery

## 2021-03-10 ENCOUNTER — Other Ambulatory Visit: Payer: Self-pay

## 2021-03-10 VITALS — BP 125/72 | Resp 20 | Ht 70.0 in | Wt 200.0 lb

## 2021-03-10 DIAGNOSIS — Z951 Presence of aortocoronary bypass graft: Secondary | ICD-10-CM | POA: Diagnosis not present

## 2021-03-10 DIAGNOSIS — I712 Thoracic aortic aneurysm, without rupture, unspecified: Secondary | ICD-10-CM

## 2021-03-10 MED ORDER — IOPAMIDOL (ISOVUE-370) INJECTION 76%
60.0000 mL | Freq: Once | INTRAVENOUS | Status: AC | PRN
  Administered 2021-03-10: 60 mL via INTRAVENOUS

## 2021-03-10 NOTE — Progress Notes (Signed)
[address]       [place] [address]             [phone]     CARDIOTHORACIC SURGERY OFFICE NOTE  Referring Provider is Antony Contras, MD Primary Cardiologist is Candee Furbish, MD PCP is Antony Contras, MD   HPI:  80 year old man returns for annual aneurysm surveillance.  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  He also has a proximal ascending aortic aneurysm.  He is also under surveillance by Dr. Trula Slade, status post EVAR with endoleak which was fixed approximately 6 years ago.  He denies any chest pain or abdominal pain.  He states his blood pressure is well controlled at home.   Current Outpatient Medications  Medication Sig Dispense Refill  . allopurinol (ZYLOPRIM) 300 MG tablet Take 300 mg by mouth every other day. Take in the evening - on even days of the month    . amLODipine (NORVASC) 10 MG tablet TAKE 1 TABLET BY MOUTH EVERY DAY 90 tablet 2  . Cholecalciferol (VITAMIN D3) 2000 UNITS TABS Take 2,000 Units by mouth daily.     . clonazePAM (KLONOPIN) 1 MG tablet Take 0.5 mg by mouth at bedtime.    . clopidogrel (PLAVIX) 75 MG tablet TAKE 1 TABLET BY MOUTH  DAILY 90 tablet 2  . Coenzyme Q10 300 MG CAPS Take by mouth.    Marland Kitchen FARXIGA 10 MG TABS tablet Take 10 mg by mouth daily.    . fenofibrate (TRICOR) 48 MG tablet Take 1 tablet (48 mg total) by mouth daily. 90 tablet 3  . fluorouracil (EFUDEX) 5 % cream Use as directed    . fluticasone (FLONASE) 50 MCG/ACT nasal spray Place 2 sprays into both nostrils daily as needed for allergies.    Marland Kitchen glipiZIDE (GLUCOTROL) 5 MG tablet TAKE 1/2 TABLET BY MOUTH DAILY BEFORE BREAKFAST. 45 tablet 1  . glucose blood (ACCU-CHEK GUIDE) test strip 2x daily 100 each 12  . ibuprofen (ADVIL,MOTRIN) 200 MG tablet Take 200 mg by mouth as needed.    Marland Kitchen levothyroxine (SYNTHROID, LEVOTHROID) 175 MCG tablet Take 175 mcg by mouth daily before breakfast.    . loratadine-pseudoephedrine  (CLARITIN-D 24-HOUR) 10-240 MG per 24 hr tablet Take 1 tablet by mouth at bedtime.    . metoprolol tartrate (LOPRESSOR) 25 MG tablet Take 1 tablet (25 mg total) by mouth 2 (two) times daily. 180 tablet 1  . Multiple Vitamins-Minerals (PRESERVISION AREDS 2) CAPS Take 1 capsule by mouth 2 (two) times daily.    Marland Kitchen olmesartan-hydrochlorothiazide (BENICAR HCT) 40-25 MG tablet Take 1 tablet by mouth daily. 90 tablet 2  . pantoprazole (PROTONIX) 40 MG tablet Take 1 tablet (40 mg total) by mouth daily. 90 tablet 1  . rosuvastatin (CRESTOR) 10 MG tablet Take 10 mg by mouth as directed. Per patient, takes Mondays & Thursdays    . Tamsulosin HCl (FLOMAX) 0.4 MG CAPS Take 0.4 mg by mouth every other day. Take in the evening - on even days of the month    . triamcinolone cream (KENALOG) 0.1 % Apply 1 application topically as needed.  2  . VASCEPA 1 g capsule Take 2 g by mouth 2 (two) times daily.    . vitamin B-12 (CYANOCOBALAMIN) 1000 MCG tablet Take 2,500 mcg by mouth every other day.      No current facility-administered medications for this visit.      Physical Exam:  BP 125/72   Resp 20   Ht 5\' 10"  (1.778 m)   Wt 90.7 kg   SpO2 97% Comment: RA  BMI 28.70 kg/m   General:  Well-appearing man in no acute distress  Chest:   Clear to auscultation  CV:   Regular rate and rhythm  Abdomen:  Soft nontender  Extremities:  Warm and well-perfused  Diagnostic Tests:  I reviewed his available imaging studies and agree with their interpretation.   Impression:  Stable penetrating ulcer at the proximal descending aorta and stable ascending aortic aneurysm measuring approximately 4.5 to 4.6 cm maximum diameter  Plan:  Follow-up in 1 year with repeat CT chest Report any chest pain or back pain to local emergency department  I spent in excess of 15 minutes during the conduct of this office consultation and >50% of this time involved direct face-to-face encounter with the patient for counseling and/or  coordination of their care.  Level 2                 10 minutes Level 3                 15 minutes Level 4                 25 minutes Level 5                 40 minutes  B.  Murvin Natal, MD 03/10/2021 2:41 PM

## 2021-03-23 DIAGNOSIS — L814 Other melanin hyperpigmentation: Secondary | ICD-10-CM | POA: Diagnosis not present

## 2021-03-23 DIAGNOSIS — L570 Actinic keratosis: Secondary | ICD-10-CM | POA: Diagnosis not present

## 2021-03-23 DIAGNOSIS — D1801 Hemangioma of skin and subcutaneous tissue: Secondary | ICD-10-CM | POA: Diagnosis not present

## 2021-03-23 DIAGNOSIS — D2372 Other benign neoplasm of skin of left lower limb, including hip: Secondary | ICD-10-CM | POA: Diagnosis not present

## 2021-03-23 DIAGNOSIS — L821 Other seborrheic keratosis: Secondary | ICD-10-CM | POA: Diagnosis not present

## 2021-03-23 DIAGNOSIS — L308 Other specified dermatitis: Secondary | ICD-10-CM | POA: Diagnosis not present

## 2021-03-23 DIAGNOSIS — L853 Xerosis cutis: Secondary | ICD-10-CM | POA: Diagnosis not present

## 2021-03-26 ENCOUNTER — Other Ambulatory Visit: Payer: Self-pay | Admitting: Cardiology

## 2021-03-31 ENCOUNTER — Ambulatory Visit: Payer: Medicare Other | Admitting: Internal Medicine

## 2021-03-31 ENCOUNTER — Encounter: Payer: Self-pay | Admitting: Internal Medicine

## 2021-03-31 ENCOUNTER — Other Ambulatory Visit: Payer: Self-pay

## 2021-03-31 VITALS — BP 126/72 | HR 76 | Ht 70.0 in | Wt 201.2 lb

## 2021-03-31 DIAGNOSIS — E1165 Type 2 diabetes mellitus with hyperglycemia: Secondary | ICD-10-CM

## 2021-03-31 DIAGNOSIS — Z794 Long term (current) use of insulin: Secondary | ICD-10-CM | POA: Diagnosis not present

## 2021-03-31 LAB — POCT GLYCOSYLATED HEMOGLOBIN (HGB A1C): Hemoglobin A1C: 6.1 % — AB (ref 4.0–5.6)

## 2021-03-31 MED ORDER — GLIPIZIDE 5 MG PO TABS: 2.5000 mg | ORAL_TABLET | Freq: Every day | ORAL | 3 refills | Status: DC

## 2021-03-31 NOTE — Progress Notes (Signed)
Name: Garrett Jordan  Age/ Sex: 80 y.o., male   MRN/ DOB: [id number], 15-Feb-1941     PCP: Antony Contras, MD   Reason for Endocrinology Evaluation: Type 2 Diabetes Mellitus     Initial Endocrinology Clinic Visit:  03/17/2020    PATIENT IDENTIFIER: Garrett Jordan. is a 80 y.o. male with a past medical history of T2DM, RLS, CAD and CKD. The patient has followed with Endocrinology clinic since 03/17/2020 for consultative assistance with management of his diabetes.  DIABETIC HISTORY:  Garrett Jordan was diagnosed with T2 DM in 2019. Invokana- cost prohibitive . His hemoglobin A1c has ranged from 6.7% in 2020, peaking at 7.8 % in 2021    On his initial visit to our clinic his A1c was 6.5 % , he was on Januvia which was continued, Invokana was cost prohibitive and we switched it to Glipizide.   Januvia stopped 06/2020 due to high cost for pt  Jardiance started by nephrology by 07/2020    HYPERCALCEMIA HISTORY: Garrett Jordan indicates that he was first noted with hypercalcemia in 2020. He has hx of kidney stones  He is S/P Left parathyroidectomy in 1970's  Ca/Cr ratio 0.0210  SUBJECTIVE:   During the last visit (10/18/2020): A1c 6.1% We continued Jardiance and Glipizide    Today (03/31/2021): Garrett Jordan is here for a follow up on diabetes management.  He checks his blood sugars 1 times daily, preprandial to breakfast . The patient has not had hypoglycemic episodes since the last clinic visit.  He denies episodes of shaking, cold sweates or severe but he does get episodes where he feels poorly  Up to 2-3x a month.    He is on or on tablet but no anemia   Follows with Dr. Marlou Porch , he was started on Fenofibrate by Dr. Marlou Porch   Denies nausea or diarrhea    HOME DIABETES REGIMEN:  Glipizide 5 mg, half a tablet BID  Farxiga  10 mg daily  Rosuvastatin 20 mg , Half a tablet Monday and Thursday  Vascepa 1 g, 2 caps twice daily  Fenofibrate 48 mg daily    Statin: yes ACE-I/ARB:  yes     METER DOWNLOAD SUMMARY: Unable to download   [id number]  DIABETIC COMPLICATIONS: Microvascular complications:   CKD III  Denies: retinopathy, neuropathy   Last Eye Exam: Completed 01/2020  Macrovascular complications:   CAD  Denies: CVA, PVD       PHYSICAL EXAM: VS: BP 126/72   Pulse 76   Ht 5\' 10"  (1.778 m)   Wt 201 lb 4 oz (91.3 kg)   SpO2 97%   BMI 28.88 kg/m    EXAM: General: Pt appears well and is in NAD  Lungs: Clear with good BS bilat with no rales, rhonchi, or wheezes  Heart: Auscultation: RRR  Abdomen: Normoactive bowel sounds, soft, nontender, without masses or organomegaly palpable  Extremities:  BL IO:XBDZH edema B/L   Mental Status: Judgment, insight: intact Orientation: oriented to time, place, and person Mood and affect: no depression, anxiety, or agitation    DM Foot Exam 03/31/2021  The skin of the feet is intact without sores or ulcerations. The pedal pulses are 1+ on right and 1+ on left. The sensation is intact to a screening 5.07, 10 gram monofilament bilaterally    HISTORY:  Past Medical History:  Past Medical History:  Diagnosis Date  . AAA (abdominal aortic aneurysm) ([place])   . Arthritis  knees  . Atrial fibrillation ([place])   . CAD (coronary artery disease)   . Cancer [facility])    prostate  . Diabetes mellitus without complication ([place])   . GERD (gastroesophageal reflux disease)   . Gout   . Hyperlipidemia   . Hypertension   . Hypothyroidism   . Kidney stone    right kidney  . Myocardial infarction ([place]) 01/1991   sp inferior  . Nerve compression    right leg  . Reflux   . Seasonal allergies   . Thyroid disease    hypothyroidism   Past Surgical History:  Past Surgical History:  Procedure Laterality Date  . ABDOMINAL AORTAGRAM N/A 02/28/2012   Procedure: ABDOMINAL Maxcine Ham;  Surgeon: Serafina Mitchell, MD;  Location: [facility] CATH LAB;  Service: Cardiovascular;  Laterality: N/A;  . abdominal aortagram embolization   02/28/2012  . ABDOMINAL AORTIC ANEURYSM REPAIR  Sept. 2013  . CORONARY ARTERY BYPASS GRAFT  04/2009  . [place] and  2009   RCA  . EMBOLIZATION Right 02/28/2012   Procedure: EMBOLIZATION;  Surgeon: Serafina Mitchell, MD;  Location: [facility] CATH LAB;  Service: Cardiovascular;  Laterality: Right;  . IR GENERIC HISTORICAL  12/05/2016   IR RADIOLOGIST EVAL & MGMT 12/05/2016 Jacqulynn Cadet, MD [facility] INTERV RAD  . IR RADIOLOGIST EVAL & MGMT  07/10/2019  . IR RADIOLOGIST EVAL & MGMT  08/25/2020  . KIDNEY STONE SURGERY    . LEFT HEART CATHETERIZATION WITH CORONARY/GRAFT ANGIOGRAM N/A 01/08/2015   Procedure: LEFT HEART CATHETERIZATION WITH Beatrix Fetters;  Surgeon: Jacolyn Reedy, MD;  Location: [facility] CATH LAB;  Service: Cardiovascular;  Laterality: N/A;  . parathyroid adenoma    . PROSTATE SURGERY     rad seeds  . SPINE SURGERY    . TONSILLECTOMY      Social History:  reports that he quit smoking about 50 years ago. His smoking use included cigarettes. He has never used smokeless tobacco. He reports current alcohol use of about 6.0 - 9.0 standard drinks of alcohol per week. He reports that he does not use drugs. Family History:  Family History  Problem Relation Age of Onset  . Heart disease Father        Heart Disease before age 7  . Heart attack Father   . Hyperlipidemia Father   . Hypertension Father   . Stroke Mother   . Deep vein thrombosis Mother   . Heart disease Sister   . Diabetes Sister   . Hyperlipidemia Sister   . Hypertension Sister   . Heart attack Sister   . Heart disease Paternal Uncle   . Diabetes Maternal Grandmother   . Diabetes Sister      HOME MEDICATIONS: Allergies as of 03/31/2021      Reactions   Ace Inhibitors Cough   Crestor [rosuvastatin] Other (See Comments)   Muscle aches   Nitroglycerin Other (See Comments)   Very pronounced lowered BP with IV nitro for caths   Quinolones    Aortic root enlargement      Medication List        Accurate as of Mar 31, 2021  8:55 AM. If you have any questions, ask your nurse or doctor.        Accu-Chek Guide test strip Generic drug: glucose blood 2x daily   allopurinol 300 MG tablet Commonly known as: ZYLOPRIM Take 300 mg by mouth every other day. Take in the evening - on even days of the month  amLODipine 10 MG tablet Commonly known as: NORVASC TAKE 1 TABLET BY MOUTH EVERY DAY   clonazePAM 1 MG tablet Commonly known as: KLONOPIN Take 0.5 mg by mouth at bedtime.   clopidogrel 75 MG tablet Commonly known as: PLAVIX TAKE 1 TABLET BY MOUTH  DAILY   Coenzyme Q10 300 MG Caps Take by mouth.   Farxiga 10 MG Tabs tablet Generic drug: dapagliflozin propanediol Take 10 mg by mouth daily.   fenofibrate 48 MG tablet Commonly known as: Tricor Take 1 tablet (48 mg total) by mouth daily.   fluorouracil 5 % cream Commonly known as: EFUDEX Use as directed   fluticasone 50 MCG/ACT nasal spray Commonly known as: FLONASE Place 2 sprays into both nostrils daily as needed for allergies.   glipiZIDE 5 MG tablet Commonly known as: GLUCOTROL TAKE 1/2 TABLET BY MOUTH DAILY BEFORE BREAKFAST.   ibuprofen 200 MG tablet Commonly known as: ADVIL Take 200 mg by mouth as needed.   levothyroxine 175 MCG tablet Commonly known as: SYNTHROID Take 175 mcg by mouth daily before breakfast.   loratadine-pseudoephedrine 10-240 MG 24 hr tablet Commonly known as: CLARITIN-D 24-hour Take 1 tablet by mouth at bedtime.   metoprolol tartrate 25 MG tablet Commonly known as: LOPRESSOR Take 1 tablet (25 mg total) by mouth 2 (two) times daily.   olmesartan-hydrochlorothiazide 40-25 MG tablet Commonly known as: BENICAR HCT TAKE 1 TABLET BY MOUTH  DAILY   pantoprazole 40 MG tablet Commonly known as: PROTONIX Take 1 tablet (40 mg total) by mouth daily.   PreserVision AREDS 2 Caps Take 1 capsule by mouth 2 (two) times daily.   rosuvastatin 10 MG tablet Commonly known as:  CRESTOR Take 10 mg by mouth as directed. Per patient, takes Mondays & Thursdays   tamsulosin 0.4 MG Caps capsule Commonly known as: FLOMAX Take 0.4 mg by mouth every other day. Take in the evening - on even days of the month   triamcinolone cream 0.1 % Commonly known as: KENALOG Apply 1 application topically as needed.   Vascepa 1 g capsule Generic drug: icosapent Ethyl Take 2 g by mouth 2 (two) times daily.   vitamin B-12 1000 MCG tablet Commonly known as: CYANOCOBALAMIN Take 2,500 mcg by mouth every other day.   Vitamin D3 50 MCG (2000 UT) Tabs Take 2,000 Units by mouth daily.       DM foot exam: 03/17/2020  The skin of the feet is intact without sores or ulcerations. The pedal pulses are 2+ on right and 2+ on left. The sensation is intact to a screening 5.07, 10 gram monofilament bilaterally   DATA REVIEWED:  Lab Results  Component Value Date   HGBA1C 6.1 (A) 03/31/2021   HGBA1C 6.1 (A) 10/18/2020   HGBA1C 6.5 (A) 03/17/2020   Lab Results  Component Value Date   LDLCALC 102 (H) 01/08/2015   CREATININE 1.70 (H) 08/19/2020         12/17/2019 A1c 7.8%  BUN/Cr 31/1.68 GFR 40 Calcium 10.9 (corrected calcium 10.41) Tg 946  HDL 25 LDL 52 Vitamin D 37.4   12/16/2020 BUN/Cr 23/1.58 GFR 43 Na 139 K 4.2 Ca 10.3 Corrected 9.81 Alb 4.5  T. Chol 134 HDL 30 Tg 435 LDL 41 TSH 2.34       ASSESSMENT / PLAN / RECOMMENDATIONS:   1) Type 2 Diabetes Mellitus, Optimally controlled, With CKD III complications - Most recent A1c of 6.1 %. Goal A1c < 7.0%.   -His A1c remains low at 6.1% -He checks  his glucose once daily and there is no evidence of hypoglycemia on these readings, but he does report a few episodes of months of feeling poorly, he does not check glucose during these episodes, and I am concerned that he is having hypoglycemia that we are not capturing on his meter. -I am going to reduce his glipizide as below -He was asked to alternate glucose checks  from fasting and suppertime  MEDICATIONS:   Decrease glipizide 5 mg, half a tablet before  Supper   Continue Farxiga 10 mg daily     EDUCATION / INSTRUCTIONS:  BG monitoring instructions: Patient is instructed to check his blood sugars 2 times a day, before breakfast and supper .  Call [place] if: BG persistently < 70  . I reviewed the Rule of 15 for the treatment of hypoglycemia in detail with the patient. Literature supplied.     2) Diabetic complications:   Eye: Does not have known diabetic retinopathy.   Neuro/ Feet: Does not have known diabetic peripheral neuropathy .   Renal: Patient does  have known baseline CKD. He   is on an ACEI/ARB at present.    3) Hypertriglyceridemia: Patient is on rosuvastatin twice a week due to myalgia. He is on  Vascepa as well started in 2021.  Tg in 12/2019 was 946 mg/dl , repeat Tg decreased to 298 mg/dL by 05/2020 -Repeat TG's have increased to 400 again, this has been handled by cardiology       F/U in 6 months    Signed electronically by: Mack Guise, MD  [facility]  [address] [place], [place] [address] Phone: [phone] FAX: [phone]   CC: Antony Contras, [place] [address] Phone: [phone]  Fax: [phone]  Return to Endocrinology clinic as below: Future Appointments  Date Time Provider [place]  04/19/2021  8:30 AM [facility]  07/15/2021  9:20 AM Jerline Pain, MD [facility]

## 2021-03-31 NOTE — Patient Instructions (Addendum)
-   Decreased  Glipizide 5 mg , to HALF a tablet before Supper only - Continue Farxiga 10 mg , 1 tablet daily in the morning       HOW TO TREAT LOW BLOOD SUGARS (Blood sugar LESS THAN 70 MG/DL)  Please follow the RULE OF 15 for the treatment of hypoglycemia treatment (when your (blood sugars are less than 70 mg/dL)    STEP 1: Take 15 grams of carbohydrates when your blood sugar is low, which includes:   3-4 GLUCOSE TABS  OR  3-4 OZ OF JUICE OR REGULAR SODA OR  ONE TUBE OF GLUCOSE GEL     STEP 2: RECHECK blood sugar in 15 MINUTES STEP 3: If your blood sugar is still low at the 15 minute recheck --> then, go back to STEP 1 and treat AGAIN with another 15 grams of carbohydrates.

## 2021-04-13 ENCOUNTER — Ambulatory Visit: Payer: Medicare Other | Admitting: Internal Medicine

## 2021-04-15 DIAGNOSIS — R42 Dizziness and giddiness: Secondary | ICD-10-CM | POA: Diagnosis not present

## 2021-04-15 DIAGNOSIS — H6123 Impacted cerumen, bilateral: Secondary | ICD-10-CM | POA: Diagnosis not present

## 2021-04-19 ENCOUNTER — Other Ambulatory Visit: Payer: Medicare Other

## 2021-04-19 ENCOUNTER — Other Ambulatory Visit: Payer: Self-pay

## 2021-04-19 DIAGNOSIS — E782 Mixed hyperlipidemia: Secondary | ICD-10-CM | POA: Diagnosis not present

## 2021-04-19 LAB — COMPREHENSIVE METABOLIC PANEL
ALT: 19 IU/L (ref 0–44)
AST: 16 IU/L (ref 0–40)
Albumin/Globulin Ratio: 2.1 (ref 1.2–2.2)
Albumin: 4.4 g/dL (ref 3.7–4.7)
Alkaline Phosphatase: 101 IU/L (ref 44–121)
BUN/Creatinine Ratio: 22 (ref 10–24)
BUN: 44 mg/dL — ABNORMAL HIGH (ref 8–27)
Bilirubin Total: 0.6 mg/dL (ref 0.0–1.2)
CO2: 21 mmol/L (ref 20–29)
Calcium: 10.2 mg/dL (ref 8.6–10.2)
Chloride: 97 mmol/L (ref 96–106)
Creatinine, Ser: 2.02 mg/dL — ABNORMAL HIGH (ref 0.76–1.27)
Globulin, Total: 2.1 g/dL (ref 1.5–4.5)
Glucose: 180 mg/dL — ABNORMAL HIGH (ref 65–99)
Potassium: 4.3 mmol/L (ref 3.5–5.2)
Sodium: 136 mmol/L (ref 134–144)
Total Protein: 6.5 g/dL (ref 6.0–8.5)
eGFR: 33 mL/min/{1.73_m2} — ABNORMAL LOW (ref >59)

## 2021-04-19 LAB — LIPID PANEL
Chol/HDL Ratio: 6 ratio — ABNORMAL HIGH (ref 0.0–5.0)
Cholesterol, Total: 157 mg/dL (ref 100–199)
HDL: 26 mg/dL — ABNORMAL LOW (ref >39)
LDL Chol Calc (NIH): 70 mg/dL (ref 0–99)
Triglycerides: 389 mg/dL — ABNORMAL HIGH (ref 0–149)
VLDL Cholesterol Cal: 61 mg/dL — ABNORMAL HIGH (ref 5–40)

## 2021-04-19 LAB — LDL CHOLESTEROL, DIRECT: LDL Direct: 75 mg/dL (ref 0–99)

## 2021-04-20 ENCOUNTER — Telehealth: Payer: Self-pay | Admitting: Pharmacist

## 2021-04-20 DIAGNOSIS — I2510 Atherosclerotic heart disease of native coronary artery without angina pectoris: Secondary | ICD-10-CM

## 2021-04-20 MED ORDER — VASCEPA 1 G PO CAPS: 2.0000 g | ORAL_CAPSULE | Freq: Two times a day (BID) | ORAL | 3 refills | Status: DC

## 2021-04-20 NOTE — Telephone Encounter (Signed)
Pt seen in lipid clinic March 2022; most recent lipid panel at that time showed LDL 41 and TG 435 on rosuvastatin 10mg  on Mondays and Thursdays (max tolerated dose) and Vascepa 2g BID. He was started on fenofibrate (lower dose due to renal dysfunction), TG have improved only 10% which is much less than expected. LDL also higher at 75, however direct LDL was checked so this is likely more accurate than prior LDL of 41 (falsely low due to elevated TG). Spoke with pt who reports adherence to rosuvastatin, Vascepa, and fenofibrate. Diet has not been great, encouraged him to limit intake of carbs, sugar, and alcohol to help decrease his TG. Will also add on ezetimibe 10mg  daily to help lower both TG and LDL to goal < 55 given his DM and ASCVD. Will recheck labs including direct LDL in 3 months at next appt with Dr Marlou Porch.

## 2021-05-10 ENCOUNTER — Other Ambulatory Visit: Payer: Self-pay | Admitting: Cardiology

## 2021-05-10 DIAGNOSIS — I2510 Atherosclerotic heart disease of native coronary artery without angina pectoris: Secondary | ICD-10-CM

## 2021-05-10 DIAGNOSIS — Z79899 Other long term (current) drug therapy: Secondary | ICD-10-CM

## 2021-05-10 DIAGNOSIS — Z951 Presence of aortocoronary bypass graft: Secondary | ICD-10-CM

## 2021-05-10 DIAGNOSIS — E782 Mixed hyperlipidemia: Secondary | ICD-10-CM

## 2021-05-13 ENCOUNTER — Other Ambulatory Visit: Payer: Self-pay | Admitting: Cardiology

## 2021-05-17 NOTE — Telephone Encounter (Signed)
Rx(s) sent to pharmacy electronically.  

## 2021-05-24 ENCOUNTER — Other Ambulatory Visit: Payer: Self-pay | Admitting: Urology

## 2021-06-06 ENCOUNTER — Telehealth: Payer: Self-pay | Admitting: Cardiology

## 2021-06-06 DIAGNOSIS — U071 COVID-19: Secondary | ICD-10-CM | POA: Diagnosis not present

## 2021-06-06 NOTE — Telephone Encounter (Signed)
He doesn't need to hold Plavix, Paxlovid will make his Plavix less effective (decreases AUC by 50-70%). Would need to defer to MD on whether risk is acceptable to have subtherapeutic antiplatelet for 5 days. Paxlovid also interacts with many of his other medications:  He would need to stop taking his Flomax while on Paxlovid. Will increase concentrations of his metoprolol and amlodipine (already takes max dose of this), would need to keep a close eye on his BP or decrease his amlodipine dose while on Paxlovid. Will increase concentrations of his clonazepam, would recommend avoiding use if on Paxlovid. Will make his Farxiga/glipizide less effective - would need to monitor his glucose closely. Will increase concentrations of rosuvastatin - monitor for any muscle aches.

## 2021-06-06 NOTE — Telephone Encounter (Signed)
Pt c/o medication issue:  1. Name of Medication:  clopidogrel (PLAVIX) 75 MG tablet  2. How are you currently taking this medication (dosage and times per day)?  As prescribed   3. Are you having a reaction (difficulty breathing--STAT)?  No   4. What is your medication issue?  Denton Ar with [facility] states the patient tested positive for COVID and Dr. Moreen Fowler wants the patient to start Paxlovid, but he would like our recommendation prior to prescribing. Will the patient need to hold Clopidogrel?  Please advise.  Denton Ar states her line is secure and requested a detailed voice message if she is unavailable when her call is returned. Phone #: [phone]

## 2021-06-08 NOTE — Telephone Encounter (Signed)
Denton Ar with [facility]  is aware of recommendations from Dr. Marlou Porch.

## 2021-06-08 NOTE — Telephone Encounter (Signed)
Attempted phone call to [facility] [employer] [facility] with [facility] office to advise of Dr Marlou Porch recommendation.  Brianna's voicemail is full and will not accept any additional message.

## 2021-06-16 DIAGNOSIS — Z7984 Long term (current) use of oral hypoglycemic drugs: Secondary | ICD-10-CM | POA: Diagnosis not present

## 2021-06-16 DIAGNOSIS — E782 Mixed hyperlipidemia: Secondary | ICD-10-CM | POA: Diagnosis not present

## 2021-06-16 DIAGNOSIS — N184 Chronic kidney disease, stage 4 (severe): Secondary | ICD-10-CM | POA: Diagnosis not present

## 2021-06-16 DIAGNOSIS — E039 Hypothyroidism, unspecified: Secondary | ICD-10-CM | POA: Diagnosis not present

## 2021-06-16 DIAGNOSIS — Z8616 Personal history of COVID-19: Secondary | ICD-10-CM | POA: Diagnosis not present

## 2021-06-16 DIAGNOSIS — G2581 Restless legs syndrome: Secondary | ICD-10-CM | POA: Diagnosis not present

## 2021-06-16 DIAGNOSIS — E559 Vitamin D deficiency, unspecified: Secondary | ICD-10-CM | POA: Diagnosis not present

## 2021-06-16 DIAGNOSIS — Z9889 Other specified postprocedural states: Secondary | ICD-10-CM | POA: Diagnosis not present

## 2021-06-16 DIAGNOSIS — E1122 Type 2 diabetes mellitus with diabetic chronic kidney disease: Secondary | ICD-10-CM | POA: Diagnosis not present

## 2021-06-16 DIAGNOSIS — E538 Deficiency of other specified B group vitamins: Secondary | ICD-10-CM | POA: Diagnosis not present

## 2021-06-16 DIAGNOSIS — I10 Essential (primary) hypertension: Secondary | ICD-10-CM | POA: Diagnosis not present

## 2021-06-16 DIAGNOSIS — M109 Gout, unspecified: Secondary | ICD-10-CM | POA: Diagnosis not present

## 2021-06-20 ENCOUNTER — Other Ambulatory Visit: Payer: Self-pay | Admitting: Cardiology

## 2021-06-22 DIAGNOSIS — I129 Hypertensive chronic kidney disease with stage 1 through stage 4 chronic kidney disease, or unspecified chronic kidney disease: Secondary | ICD-10-CM | POA: Diagnosis not present

## 2021-06-22 DIAGNOSIS — Z8616 Personal history of COVID-19: Secondary | ICD-10-CM | POA: Diagnosis not present

## 2021-06-22 DIAGNOSIS — Z791 Long term (current) use of non-steroidal anti-inflammatories (NSAID): Secondary | ICD-10-CM | POA: Diagnosis not present

## 2021-06-22 DIAGNOSIS — N281 Cyst of kidney, acquired: Secondary | ICD-10-CM | POA: Diagnosis not present

## 2021-06-22 DIAGNOSIS — N1832 Chronic kidney disease, stage 3b: Secondary | ICD-10-CM | POA: Diagnosis not present

## 2021-06-25 ENCOUNTER — Other Ambulatory Visit: Payer: Self-pay | Admitting: Internal Medicine

## 2021-06-28 ENCOUNTER — Other Ambulatory Visit: Payer: Self-pay | Admitting: Nephrology

## 2021-06-28 DIAGNOSIS — N179 Acute kidney failure, unspecified: Secondary | ICD-10-CM

## 2021-06-30 DIAGNOSIS — N184 Chronic kidney disease, stage 4 (severe): Secondary | ICD-10-CM | POA: Diagnosis not present

## 2021-07-04 ENCOUNTER — Ambulatory Visit
Admission: RE | Admit: 2021-07-04 | Discharge: 2021-07-04 | Disposition: A | Discharge status: Home / Self Care | Payer: Medicare Other | Source: Ambulatory Visit | Attending: Nephrology | Admitting: Nephrology

## 2021-07-04 DIAGNOSIS — N179 Acute kidney failure, unspecified: Secondary | ICD-10-CM | POA: Diagnosis not present

## 2021-07-04 DIAGNOSIS — N200 Calculus of kidney: Secondary | ICD-10-CM | POA: Diagnosis not present

## 2021-07-07 ENCOUNTER — Other Ambulatory Visit: Payer: Self-pay | Admitting: Cardiology

## 2021-07-14 NOTE — Progress Notes (Signed)
Cardiology Office Note:    Date:  07/15/2021   ID:  Garrett Meek Sr., DOB [birth year], MRN [id number]  PCP:  Antony Contras, MD   [place]  Cardiologist:  Candee Furbish, MD  Advanced Practice Provider:  No care team member to display Electrophysiologist:  None       Referring MD: Antony Contras, MD    History of Present Illness:    Garrett Millikan. is a 80 y.o. male here for the follow-up of coronary artery disease, hyperlipidemia, abdominal aortic aneurysm, and atrial fibrillation.  Sharp pains with cough, right side under breast. ? Gall bladder 3-4 weeks. After eating.  He will discuss further with Dr. Moreen Fowler.  Prior CABG in 2010 Dr. Lawson Fiscal.  AAA stent graft as well as Dr. 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  Dr. Trula Slade has been following.  Lipid panel recently total cholesterol 134 HDL 30 triglycerides 435 LDL 41.  He is on Crestor Monday and Thursday and he is on Vascepa for the past year or so.     Today, SON had arch aortic dissection. Now doing well. Coming tues. Stroke affected memory and speech.   His EKG today [place] demonstrates atrial fibrillation 65 right bundle branch block.  Rate control.  He is asymptomatic with this.  We are going to place him back on the Eliquis.  He is also on Plavix with his stent graft.  He has also seen lipid clinic.  Triglycerides a bit high.  He is now on Vascepa.  He was supposed to be started on Zetia.  He thinks he may be taking this however it is not on his medication list.  We will go ahead and place him on this as well.  Past Medical History:  Diagnosis Date   AAA (abdominal aortic aneurysm) (HCC)    Arthritis    knees   Atrial fibrillation (HCC)    CAD (coronary artery disease)    Cancer (HCC)    prostate   Diabetes mellitus without complication (HCC)    GERD (gastroesophageal reflux disease)    Gout    Hyperlipidemia    Hypertension     Hypothyroidism    Kidney stone    right kidney   Myocardial infarction ([place]) 01/1991   sp inferior   Nerve compression    right leg   Reflux    Seasonal allergies    Thyroid disease    hypothyroidism    Past Surgical History:  Procedure Laterality Date   ABDOMINAL AORTAGRAM N/A 02/28/2012   Procedure: ABDOMINAL Garrett Jordan;  Surgeon: Serafina Mitchell, MD;  Location: [facility] CATH LAB;  Service: Cardiovascular;  Laterality: N/A;   abdominal aortagram embolization  02/28/2012   ABDOMINAL AORTIC ANEURYSM REPAIR  Sept. 2013   CORONARY ARTERY BYPASS GRAFT  04/2009   CORONARY STENT PLACEMENT  1992 and  2009   RCA   EMBOLIZATION Right 02/28/2012   Procedure: EMBOLIZATION;  Surgeon: Serafina Mitchell, MD;  Location: [facility] CATH LAB;  Service: Cardiovascular;  Laterality: Right;   IR GENERIC HISTORICAL  12/05/2016   IR RADIOLOGIST EVAL & MGMT 12/05/2016 Jacqulynn Cadet, MD [facility] INTERV RAD   IR RADIOLOGIST EVAL & MGMT  07/10/2019   IR RADIOLOGIST EVAL & MGMT  08/25/2020   KIDNEY STONE SURGERY     LEFT HEART CATHETERIZATION WITH CORONARY/GRAFT ANGIOGRAM N/A 01/08/2015   Procedure: LEFT HEART CATHETERIZATION WITH CORONARY/GRAFT ANGIOGRAM;  Surgeon:  Jacolyn Reedy, MD;  Location: [facility] CATH LAB;  Service: Cardiovascular;  Laterality: N/A;   parathyroid adenoma     PROSTATE SURGERY     rad seeds   SPINE SURGERY     TONSILLECTOMY      Current Medications: Current Meds  Medication Sig   allopurinol (ZYLOPRIM) 300 MG tablet Take 300 mg by mouth every other day. Take in the evening - on even days of the month   amLODipine (NORVASC) 10 MG tablet TAKE 1 TABLET BY MOUTH  DAILY   apixaban (ELIQUIS) 5 MG TABS tablet Take 1 tablet (5 mg total) by mouth 2 (two) times daily.   Cholecalciferol (VITAMIN D3) 2000 UNITS TABS Take 2,000 Units by mouth daily.    clonazePAM (KLONOPIN) 1 MG tablet Take 0.5 mg by mouth at bedtime.   clopidogrel (PLAVIX) 75 MG tablet TAKE 1 TABLET BY MOUTH  DAILY   Coenzyme Q10 300 MG CAPS  Take by mouth.   ezetimibe (ZETIA) 10 MG tablet Take 1 tablet (10 mg total) by mouth daily.   FARXIGA 10 MG TABS tablet Take 10 mg by mouth daily.   fenofibrate (TRICOR) 48 MG tablet Take 1 tablet (48 mg total) by mouth daily.   fluorouracil (EFUDEX) 5 % cream Use as directed   fluticasone (FLONASE) 50 MCG/ACT nasal spray Place 2 sprays into both nostrils daily as needed for allergies.   glipiZIDE (GLUCOTROL) 5 MG tablet Take 0.5 tablets (2.5 mg total) by mouth daily before supper.   glucose blood (ACCU-CHEK GUIDE) test strip USE TWICE DAILY   ibuprofen (ADVIL,MOTRIN) 200 MG tablet Take 200 mg by mouth as needed.   levothyroxine (SYNTHROID, LEVOTHROID) 175 MCG tablet Take 175 mcg by mouth daily before breakfast.   loratadine-pseudoephedrine (CLARITIN-D 24-HOUR) 10-240 MG per 24 hr tablet Take 1 tablet by mouth at bedtime.   metoprolol tartrate (LOPRESSOR) 25 MG tablet TAKE 1 TABLET BY MOUTH  TWICE DAILY   Multiple Vitamins-Minerals (PRESERVISION AREDS 2) CAPS Take 1 capsule by mouth 2 (two) times daily.   olmesartan-hydrochlorothiazide (BENICAR HCT) 40-25 MG tablet TAKE 1 TABLET BY MOUTH  DAILY   pantoprazole (PROTONIX) 40 MG tablet TAKE 1 TABLET BY MOUTH  DAILY   rosuvastatin (CRESTOR) 10 MG tablet Take 10 mg by mouth as directed. Per patient, takes Mondays & Thursdays   Tamsulosin HCl (FLOMAX) 0.4 MG CAPS Take 0.4 mg by mouth every other day. Take in the evening - on even days of the month   triamcinolone cream (KENALOG) 0.1 % Apply 1 application topically as needed.   VASCEPA 1 g capsule Take 2 capsules (2 g total) by mouth 2 (two) times daily.   vitamin B-12 (CYANOCOBALAMIN) 1000 MCG tablet Take 2,500 mcg by mouth every other day.      Allergies:   Ace inhibitors, Crestor [rosuvastatin], Nitroglycerin, and Quinolones   Social History   Socioeconomic History   Marital status: Divorced    Spouse name: Not on file   Number of children: Not on file   Years of education: Not on file    Highest education level: Not on file  Occupational History   Not on file  Tobacco Use   Smoking status: Former    Types: Cigarettes    Quit date: 08/01/1970    Years since quitting: 50.9   Smokeless tobacco: Never  Vaping Use   Vaping Use: Never used  Substance and Sexual Activity   Alcohol use: Yes    Alcohol/week: 6.0 - 9.0 standard  drinks    Types: 4 - 5 Glasses of wine, 2 - 4 Cans of beer per week    Comment: 1-3 glasses 3-4 times a week   Drug use: No   Sexual activity: Not on file  Other Topics Concern   Not on file  Social History Narrative   Not on file   Social Determinants of Health   Financial Resource Strain: Not on file  Food Insecurity: Not on file  Transportation Needs: Not on file  Physical Activity: Not on file  Stress: Not on file  Social Connections: Not on file     Family History: The patient's family history includes Deep vein thrombosis in his mother; Diabetes in his maternal grandmother, sister, and sister; Heart attack in his father and sister; Heart disease in his father, paternal uncle, and sister; Hyperlipidemia in his father and sister; Hypertension in his father and sister; Stroke in his mother.  ROS:   Please see the history of present illness.    All other systems reviewed and are negative.  EKGs/Labs/Other Studies Reviewed:    The following studies were reviewed today:  Renal Artery Duplex 07/04/2021: IMPRESSION: 1. Negative for hydronephrosis. 2. Nonobstructive right nephrolithiasis. 3. No Doppler ultrasound suggestion of hemodynamically significant renal artery stenosis. If there is continued clinical concern, renal MRA (lower radiation risk, can be performed noncontrast in the setting of renal dysfunction) and CTA ( higher spatial resolution) represent more accurate studies, which are additionally more sensitive to the detection of duplicated renal arteries.  CT Angio Chest 03/10/2021: IMPRESSION: 1. Stable dilatation of the  ascending thoracic aorta at 4.1 cm. Recommend annual imaging followup by CTA or MRA. This recommendation follows 2010 ACCF/AHA/AATS/ACR/ASA/SCA/SCAI/SIR/STS/SVM Guidelines for the Diagnosis and Management of Patients with Thoracic Aortic Disease. Circulation. 2010; 121: Z610-R604. Aortic aneurysm NOS (ICD10-I71.9) 2. Post CABG anatomy.  NUC 03/10/20  The left ventricular ejection fraction is mildly decreased (45-54%). Nuclear stress EF: 50%. No T wave inversion was noted during stress. There was no ST segment deviation noted during stress. Defect 1: There is a medium defect of mild severity present in the basal inferior and mid inferior location. This is a low risk study.   Medium size, mild intensity basal to mid inferior perfusion defect which is mostly fixed, suggestive of infarct without significant ischemia. This correlates with known occlusion of the SVG to PDA. No other significant reversible ischemia. LVEF 50% with basal to mid inferior hypokinesis. This is a low risk study.    Monitor 01/15/2020: Sinus pauses x 3, longest is 3.1 to 3.8 second during day time hours. Sinus rhythm, occasional PVC. Rare atrial tachycardia. Occasional baseline artifact but no clear evidence of atrial fibrialltion This monitor is similar to prior.   Will discontinue metoprolol given pauses. I would like for him to have an EP consult given his underlying conduction disease and potential need for pacemaker.   Cardiac catheterization 01/08/2015: Severe native three-vessel coronary artery disease 2.  Occlusion of the saphenous vein graft to very small posterior descending artery with stenosis in the right coronary artery graft prior to that in the place of a previous stent, the other grafts are widely patent 3.  Preserved LV function with inferior hypokinesis 4.  Moderately severe stenosis at the distal portion of a small graft to the right ventricular branch    EKG:     EKG is personally reviewed  and interpreted. 07/15/2021: AFIB 56 RBBB  Recent Labs: 04/19/2021: ALT 19; BUN 44; Creatinine, Ser 2.02;  Potassium 4.3; Sodium 136  Recent Lipid Panel    Component Value Date/Time   CHOL 157 04/19/2021 0829   TRIG 389 (H) 04/19/2021 0829   HDL 26 (L) 04/19/2021 0829   CHOLHDL 6.0 (H) 04/19/2021 0829   CHOLHDL 6.7 01/08/2015 0404   VLDL 64 (H) 01/08/2015 0404   LDLCALC 70 04/19/2021 0829   LDLDIRECT 75 04/19/2021 0829     Risk Assessment/Calculations:      Physical Exam:    VS:  BP 120/80 (BP Location: Left Arm, Patient Position: Sitting, Cuff Size: Normal)   Pulse 65   Ht 5\' 10"  (1.778 m)   Wt 190 lb (86.2 kg)   SpO2 95%   BMI 27.26 kg/m     Wt Readings from Last 3 Encounters:  07/15/21 190 lb (86.2 kg)  03/31/21 201 lb 4 oz (91.3 kg)  03/10/21 200 lb (90.7 kg)     GEN: Well nourished, well developed in no acute distress HEENT: Normal NECK: No JVD; No carotid bruits LYMPHATICS: No lymphadenopathy CARDIAC: RRR, no murmurs, rubs, gallops RESPIRATORY:  Clear to auscultation without rales, wheezing or rhonchi  ABDOMEN: Soft, non-tender, non-distended MUSCULOSKELETAL:  No edema; No deformity  SKIN: Warm and dry NEUROLOGIC:  Alert and oriented x 3 PSYCHIATRIC:  Normal affect    ASSESSMENT:    1. Coronary artery disease involving native coronary artery of native heart without angina pectoris   2. Paroxysmal atrial fibrillation (HCC)   3. AAA (abdominal aortic aneurysm) without rupture ([place])   4. Hypertriglyceridemia   5. Ascending aortic aneurysm (HCC)     PLAN:    In order of problems listed above: CAD (coronary artery disease) CABG 2010 Dr. Darcey Nora.  Stable.  No anginal symptoms.  Doing well.  Continue with goal-directed medical therapy.  AAA (abdominal aortic aneurysm) without rupture (HCC) Dr. Trula Slade, on clopidogrel.  Paroxysmal atrial fibrillation (HCC) EKG on 07/15/2021 shows atrial fibrillation 65 right bundle branch block.  This has returned.   Because of this, we will go ahead and start him back on his Eliquis 5 mg twice a day.  His creatinine is 1.8 however his weight is normal.  Age is 81.  At age 83, we would recommend decreasing his Eliquis to 2.5 mg twice a day.  He is also on clopidogrel/Plavix because of his AAA stent graft/endoleak, watch for any signs of bleeding.  Hypertriglyceridemia Has been working with our lipid clinic.  Appreciate assistance.  He was instructed for him to start Zetia 10 mg.  He thinks he may be taking this as a small yellow pill he states.  I do not see this on his medication list.  We will go ahead and make sure that he is prescribed this.  He did get lab work done today.  He is also on Vascepa as well as fenofibrate.  He is taking Crestor Mondays and Thursdays 10 mg, maximally tolerated.  LDL goal less than 55.  Most recently 51 likely falsely lowered by triglycerides of 484.  No myalgias.  Ascending aortic aneurysm (HCC) Recent CT scan personally reviewed from from 03/10/2021.  Stable 4.1 cm.  He also has a penetrating ulcer that is stable.    Follow-up: 6  months APP.  It would not be unreasonable to check a CBC and a basic metabolic profile at that time.  Last hemoglobin 15.9.  Creatinine 1.86 from outside labs.  He is also seeing a nephrologist.  He is off of ibuprofen.  Medication Adjustments/Labs and Tests Ordered:  Current medicines are reviewed at length with the patient today.  Concerns regarding medicines are outlined above.   Orders Placed This Encounter  Procedures   EKG 12-Lead    Meds ordered this encounter  Medications   apixaban (ELIQUIS) 5 MG TABS tablet    Sig: Take 1 tablet (5 mg total) by mouth 2 (two) times daily.    Dispense:  180 tablet    Refill:  1   ezetimibe (ZETIA) 10 MG tablet    Sig: Take 1 tablet (10 mg total) by mouth daily.    Dispense:  90 tablet    Refill:  3     Patient Instructions  Medication Instructions:  Please start Eliquis 5 mg once a day and Zetia  10 mg a day. Continue all other medications as listed.  *If you need a refill on your cardiac medications before your next appointment, please call your pharmacy*  Follow-Up: At [facility], you and your health needs are our priority.  As part of our continuing mission to provide you with exceptional heart care, we have created designated Provider Care Teams.  These Care Teams include your primary Cardiologist (physician) and Advanced Practice Providers (APPs -  Physician Assistants and Nurse Practitioners) who all work together to provide you with the care you need, when you need it.  We recommend signing up for the patient portal called "MyChart".  Sign up information is provided on this After Visit Summary.  MyChart is used to connect with patients for Virtual Visits (Telemedicine).  Patients are able to view lab/test results, encounter notes, upcoming appointments, etc.  Non-urgent messages can be sent to your provider as well.   To learn more about what you can do with MyChart, go to [email or web address].    Your next appointment:   6 month(s)  The format for your next appointment:   In Person  Provider:   You will see one of the following Advanced Practice Providers on your designated Care Team:   Any NP/PA    Thank you for choosing [facility]!!      Signed, Candee Furbish, MD  07/15/2021 9:55 AM    [place]

## 2021-07-15 ENCOUNTER — Other Ambulatory Visit: Payer: Medicare Other | Admitting: *Deleted

## 2021-07-15 ENCOUNTER — Other Ambulatory Visit: Payer: Self-pay

## 2021-07-15 ENCOUNTER — Ambulatory Visit: Payer: Medicare Other | Admitting: Cardiology

## 2021-07-15 ENCOUNTER — Encounter: Payer: Self-pay | Admitting: Cardiology

## 2021-07-15 VITALS — BP 120/80 | HR 65 | Ht 70.0 in | Wt 190.0 lb

## 2021-07-15 DIAGNOSIS — I7121 Aneurysm of the ascending aorta, without rupture: Secondary | ICD-10-CM

## 2021-07-15 DIAGNOSIS — I714 Abdominal aortic aneurysm, without rupture, unspecified: Secondary | ICD-10-CM

## 2021-07-15 DIAGNOSIS — I2510 Atherosclerotic heart disease of native coronary artery without angina pectoris: Secondary | ICD-10-CM

## 2021-07-15 DIAGNOSIS — I480 Paroxysmal atrial fibrillation: Secondary | ICD-10-CM

## 2021-07-15 DIAGNOSIS — I712 Thoracic aortic aneurysm, without rupture: Secondary | ICD-10-CM | POA: Diagnosis not present

## 2021-07-15 DIAGNOSIS — E781 Pure hyperglyceridemia: Secondary | ICD-10-CM

## 2021-07-15 LAB — LIPID PANEL
Chol/HDL Ratio: 4.7 ratio (ref 0.0–5.0)
Cholesterol, Total: 137 mg/dL (ref 100–199)
HDL: 29 mg/dL — ABNORMAL LOW (ref >39)
LDL Chol Calc (NIH): 64 mg/dL (ref 0–99)
Triglycerides: 278 mg/dL — ABNORMAL HIGH (ref 0–149)
VLDL Cholesterol Cal: 44 mg/dL — ABNORMAL HIGH (ref 5–40)

## 2021-07-15 LAB — LDL CHOLESTEROL, DIRECT: LDL Direct: 67 mg/dL (ref 0–99)

## 2021-07-15 MED ORDER — EZETIMIBE 10 MG PO TABS: 10.0000 mg | ORAL_TABLET | Freq: Every day | ORAL | 3 refills | Status: DC

## 2021-07-15 MED ORDER — APIXABAN 5 MG PO TABS: 5.0000 mg | ORAL_TABLET | Freq: Two times a day (BID) | ORAL | 1 refills | Status: DC

## 2021-07-15 NOTE — Assessment & Plan Note (Signed)
CABG 2010 Dr. Darcey Nora.  Stable.  No anginal symptoms.  Doing well.  Continue with goal-directed medical therapy.

## 2021-07-15 NOTE — Assessment & Plan Note (Signed)
Dr. Trula Slade, on clopidogrel.

## 2021-07-15 NOTE — Assessment & Plan Note (Signed)
EKG on 07/15/2021 shows atrial fibrillation 65 right bundle branch block.  This has returned.  Because of this, we will go ahead and start him back on his Eliquis 5 mg twice a day.  His creatinine is 1.8 however his weight is normal.  Age is 60.  At age 80, we would recommend decreasing his Eliquis to 2.5 mg twice a day.  He is also on clopidogrel/Plavix because of his AAA stent graft/endoleak, watch for any signs of bleeding.

## 2021-07-15 NOTE — Assessment & Plan Note (Signed)
Has been working with our lipid clinic.  Appreciate assistance.  He was instructed for him to start Zetia 10 mg.  He thinks he may be taking this as a small yellow pill he states.  I do not see this on his medication list.  We will go ahead and make sure that he is prescribed this.  He did get lab work done today.  He is also on Vascepa as well as fenofibrate.  He is taking Crestor Mondays and Thursdays 10 mg, maximally tolerated.  LDL goal less than 55.  Most recently 37 likely falsely lowered by triglycerides of 484.  No myalgias.

## 2021-07-15 NOTE — Patient Instructions (Signed)
Medication Instructions:  Please start Eliquis 5 mg once a day and Zetia 10 mg a day. Continue all other medications as listed.  *If you need a refill on your cardiac medications before your next appointment, please call your pharmacy*  Follow-Up: At [facility], you and your health needs are our priority.  As part of our continuing mission to provide you with exceptional heart care, we have created designated Provider Care Teams.  These Care Teams include your primary Cardiologist (physician) and Advanced Practice Providers (APPs -  Physician Assistants and Nurse Practitioners) who all work together to provide you with the care you need, when you need it.  We recommend signing up for the patient portal called "MyChart".  Sign up information is provided on this After Visit Summary.  MyChart is used to connect with patients for Virtual Visits (Telemedicine).  Patients are able to view lab/test results, encounter notes, upcoming appointments, etc.  Non-urgent messages can be sent to your provider as well.   To learn more about what you can do with MyChart, go to [email or web address].    Your next appointment:   6 month(s)  The format for your next appointment:   In Person  Provider:   You will see one of the following Advanced Practice Providers on your designated Care Team:   Any NP/PA    Thank you for choosing [facility]!!

## 2021-07-15 NOTE — Assessment & Plan Note (Signed)
Recent CT scan personally reviewed from from 03/10/2021.  Stable 4.1 cm.  He also has a penetrating ulcer that is stable.

## 2021-07-26 ENCOUNTER — Other Ambulatory Visit: Payer: Self-pay | Admitting: Interventional Radiology

## 2021-07-26 DIAGNOSIS — I714 Abdominal aortic aneurysm, without rupture, unspecified: Secondary | ICD-10-CM

## 2021-07-26 DIAGNOSIS — I9789 Other postprocedural complications and disorders of the circulatory system, not elsewhere classified: Secondary | ICD-10-CM

## 2021-08-06 ENCOUNTER — Other Ambulatory Visit: Payer: Self-pay | Admitting: Cardiology

## 2021-08-08 DIAGNOSIS — N1832 Chronic kidney disease, stage 3b: Secondary | ICD-10-CM | POA: Diagnosis not present

## 2021-08-12 ENCOUNTER — Other Ambulatory Visit: Payer: Self-pay

## 2021-08-12 ENCOUNTER — Ambulatory Visit (HOSPITAL_COMMUNITY)
Admission: RE | Admit: 2021-08-12 | Discharge: 2021-08-12 | Disposition: A | Discharge status: Home / Self Care | Payer: Medicare Other | Source: Ambulatory Visit | Attending: Interventional Radiology | Admitting: Interventional Radiology

## 2021-08-12 DIAGNOSIS — K8020 Calculus of gallbladder without cholecystitis without obstruction: Secondary | ICD-10-CM | POA: Diagnosis not present

## 2021-08-12 DIAGNOSIS — I714 Abdominal aortic aneurysm, without rupture, unspecified: Secondary | ICD-10-CM

## 2021-08-12 DIAGNOSIS — K760 Fatty (change of) liver, not elsewhere classified: Secondary | ICD-10-CM | POA: Diagnosis not present

## 2021-08-12 DIAGNOSIS — I9789 Other postprocedural complications and disorders of the circulatory system, not elsewhere classified: Secondary | ICD-10-CM | POA: Insufficient documentation

## 2021-08-12 DIAGNOSIS — N200 Calculus of kidney: Secondary | ICD-10-CM | POA: Diagnosis not present

## 2021-08-12 LAB — POCT I-STAT CREATININE: Creatinine, Ser: 1.5 mg/dL — ABNORMAL HIGH (ref 0.61–1.24)

## 2021-08-12 MED ORDER — IOHEXOL 350 MG/ML SOLN
100.0000 mL | Freq: Once | INTRAVENOUS | Status: AC | PRN
  Administered 2021-08-12: 100 mL via INTRAVENOUS

## 2021-08-16 ENCOUNTER — Ambulatory Visit
Admission: RE | Admit: 2021-08-16 | Discharge: 2021-08-16 | Disposition: A | Discharge status: Home / Self Care | Payer: Medicare Other | Source: Ambulatory Visit | Attending: Interventional Radiology | Admitting: Interventional Radiology

## 2021-08-16 ENCOUNTER — Encounter: Payer: Self-pay | Admitting: *Deleted

## 2021-08-16 DIAGNOSIS — I9789 Other postprocedural complications and disorders of the circulatory system, not elsewhere classified: Secondary | ICD-10-CM

## 2021-08-16 DIAGNOSIS — I714 Abdominal aortic aneurysm, without rupture, unspecified: Secondary | ICD-10-CM

## 2021-08-16 HISTORY — PX: IR RADIOLOGIST EVAL & MGMT: IMG5224

## 2021-08-16 NOTE — Progress Notes (Signed)
Chief Complaint: Patient was seen in consultation today for AAA with type 2 endoleak at the request of Mathilde Mcwherter K  Referring Physician(s): Dr. Theotis Burrow  History of Present Illness: Garrett Dobie. is a 80 y.o. male with a history of abdominal aortic aneurysm status post endovascular aortic repair with bifurcated stent graft in September 2013. 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   He underwent endovascular endoleak repair with coil embolization of the flow channel in the aneurysm sac in the origin of the inferior mesenteric artery on 07/16/2015. He presents today for follow-up.   Garrett Jordan is doing very well.    CTA on 08/19/20 demonstrates cessation of endoleak and stable size of aneurysm sac.   CTA 08/12/21: No visible endoleak.  Aneurysm sac remains stable 6.4 x 4.7 cm.    He denies significant changes in bowel or bladder habits. No chest pain, shortness of breath or other systemic symptoms.  Past Medical History:  Diagnosis Date   AAA (abdominal aortic aneurysm)    Arthritis    knees   Atrial fibrillation (HCC)    CAD (coronary artery disease)    Cancer (HCC)    prostate   Diabetes mellitus without complication (HCC)    GERD (gastroesophageal reflux disease)    Gout    Hyperlipidemia    Hypertension    Hypothyroidism    Kidney stone    right kidney   Myocardial infarction ([place]) 01/1991   sp inferior   Nerve compression    right leg   Reflux    Seasonal allergies    Thyroid disease    hypothyroidism    Past Surgical History:  Procedure Laterality Date   ABDOMINAL AORTAGRAM N/A 02/28/2012   Procedure: ABDOMINAL Maxcine Ham;  Surgeon: Serafina Mitchell, MD;  Location: [facility] CATH LAB;  Service: Cardiovascular;  Laterality: N/A;   abdominal aortagram embolization  02/28/2012   ABDOMINAL AORTIC ANEURYSM REPAIR  Sept. 2013    CORONARY ARTERY BYPASS GRAFT  04/2009   CORONARY STENT PLACEMENT  1992 and  2009   RCA   EMBOLIZATION Right 02/28/2012   Procedure: EMBOLIZATION;  Surgeon: Serafina Mitchell, MD;  Location: [facility] CATH LAB;  Service: Cardiovascular;  Laterality: Right;   IR GENERIC HISTORICAL  12/05/2016   IR RADIOLOGIST EVAL & MGMT 12/05/2016 Jacqulynn Cadet, MD [facility] INTERV RAD   IR RADIOLOGIST EVAL & MGMT  07/10/2019   IR RADIOLOGIST EVAL & MGMT  08/25/2020   IR RADIOLOGIST EVAL & MGMT  08/16/2021   KIDNEY STONE SURGERY     LEFT HEART CATHETERIZATION WITH CORONARY/GRAFT ANGIOGRAM N/A 01/08/2015   Procedure: LEFT HEART CATHETERIZATION WITH Beatrix Fetters;  Surgeon: Jacolyn Reedy, MD;  Location: [facility] CATH LAB;  Service: Cardiovascular;  Laterality: N/A;   parathyroid adenoma     PROSTATE SURGERY     rad seeds   SPINE SURGERY     TONSILLECTOMY      Allergies: Ace inhibitors, Crestor [rosuvastatin], Nitroglycerin, and Quinolones  Medications: Prior to Admission medications   Medication Sig Start Date End Date Taking? Authorizing Provider  allopurinol (ZYLOPRIM) 300 MG tablet Take 300 mg by mouth every other day. Take in the evening - on even days of the month    [provider]  amLODipine (NORVASC) 10 MG tablet TAKE 1 TABLET BY MOUTH  DAILY 08/08/21   Jerline Pain,  MD  apixaban (ELIQUIS) 5 MG TABS tablet Take 1 tablet (5 mg total) by mouth 2 (two) times daily. 07/15/21   Jerline Pain, MD  Cholecalciferol (VITAMIN D3) 2000 UNITS TABS Take 2,000 Units by mouth daily.     [provider]  clonazePAM (KLONOPIN) 1 MG tablet Take 0.5 mg by mouth at bedtime.    [provider]  clopidogrel (PLAVIX) 75 MG tablet TAKE 1 TABLET BY MOUTH  DAILY 08/08/21   Jerline Pain, MD  Coenzyme Q10 300 MG CAPS Take by mouth.    [provider]  ezetimibe (ZETIA) 10 MG tablet Take 1 tablet (10 mg total) by mouth daily. 07/15/21   Jerline Pain, MD  FARXIGA 10 MG TABS tablet Take 10 mg by  mouth daily. 12/13/20   [provider]  fenofibrate (TRICOR) 48 MG tablet Take 1 tablet (48 mg total) by mouth daily. 01/21/21   Jerline Pain, MD  fluorouracil (EFUDEX) 5 % cream Use as directed    [provider]  fluticasone (FLONASE) 50 MCG/ACT nasal spray Place 2 sprays into both nostrils daily as needed for allergies.    [provider]  glipiZIDE (GLUCOTROL) 5 MG tablet Take 0.5 tablets (2.5 mg total) by mouth daily before supper. 03/31/21   Shamleffer, Melanie Crazier, MD  glucose blood (ACCU-CHEK GUIDE) test strip USE TWICE DAILY 06/27/21   Shamleffer, Melanie Crazier, MD  ibuprofen (ADVIL,MOTRIN) 200 MG tablet Take 200 mg by mouth as needed.    [provider]  levothyroxine (SYNTHROID, LEVOTHROID) 175 MCG tablet Take 175 mcg by mouth daily before breakfast.    [provider]  loratadine-pseudoephedrine (CLARITIN-D 24-HOUR) 10-240 MG per 24 hr tablet Take 1 tablet by mouth at bedtime.    [provider]  metoprolol tartrate (LOPRESSOR) 25 MG tablet TAKE 1 TABLET BY MOUTH  TWICE DAILY 05/11/21   Jerline Pain, MD  Multiple Vitamins-Minerals (PRESERVISION AREDS 2) CAPS Take 1 capsule by mouth 2 (two) times daily.    [provider]  olmesartan-hydrochlorothiazide (BENICAR HCT) 40-25 MG tablet TAKE 1 TABLET BY MOUTH  DAILY 03/28/21   Jerline Pain, MD  pantoprazole (PROTONIX) 40 MG tablet TAKE 1 TABLET BY MOUTH  DAILY 07/09/21   Jerline Pain, MD  rosuvastatin (CRESTOR) 10 MG tablet Take 10 mg by mouth as directed. Per patient, takes Mondays & Thursdays    [provider]  Tamsulosin HCl (FLOMAX) 0.4 MG CAPS Take 0.4 mg by mouth every other day. Take in the evening - on even days of the month    [provider]  triamcinolone cream (KENALOG) 0.1 % Apply 1 application topically as needed. 02/07/18   [provider]  VASCEPA 1 g capsule Take 2 capsules (2 g total) by mouth 2 (two) times daily. 04/20/21    Jerline Pain, MD  vitamin B-12 (CYANOCOBALAMIN) 1000 MCG tablet Take 2,500 mcg by mouth every other day.     [provider]     Family History  Problem Relation Age of Onset   Heart disease Father        Heart Disease before age 62   Heart attack Father    Hyperlipidemia Father    Hypertension Father    Stroke Mother    Deep vein thrombosis Mother    Heart disease Sister    Diabetes Sister    Hyperlipidemia Sister    Hypertension Sister    Heart attack Sister  Heart disease Paternal Uncle    Diabetes Maternal Grandmother    Diabetes Sister     Social History   Socioeconomic History   Marital status: Divorced    Spouse name: Not on file   Number of children: Not on file   Years of education: Not on file   Highest education level: Not on file  Occupational History   Not on file  Tobacco Use   Smoking status: Former    Types: Cigarettes    Quit date: 08/01/1970    Years since quitting: 51.0   Smokeless tobacco: Never  Vaping Use   Vaping Use: Never used  Substance and Sexual Activity   Alcohol use: Yes    Alcohol/week: 6.0 - 9.0 standard drinks    Types: 4 - 5 Glasses of wine, 2 - 4 Cans of beer per week    Comment: 1-3 glasses 3-4 times a week   Drug use: No   Sexual activity: Not on file  Other Topics Concern   Not on file  Social History Narrative   Not on file   Social Determinants of Health   Financial Resource Strain: Not on file  Food Insecurity: Not on file  Transportation Needs: Not on file  Physical Activity: Not on file  Stress: Not on file  Social Connections: Not on file   Review of Systems: A 12 point ROS discussed and pertinent positives are indicated in the HPI above.  All other systems are negative.  Review of Systems  Vital Signs: There were no vitals taken for this visit.  Physical Exam Constitutional:      Appearance: Normal appearance.  HENT:     Head: Normocephalic and atraumatic.  Eyes:     General: No scleral  icterus. Cardiovascular:     Rate and Rhythm: Normal rate.  Pulmonary:     Effort: Pulmonary effort is normal.  Abdominal:     General: Abdomen is flat.     Palpations: Abdomen is soft.  Skin:    General: Skin is warm and dry.  Neurological:     Mental Status: He is alert and oriented to person, place, and time.  Psychiatric:        Mood and Affect: Mood normal.        Behavior: Behavior normal.      Imaging: IR Radiologist Eval & Mgmt  Result Date: 08/16/2021 Please refer to notes tab for details about interventional procedure. (Op Note)  CT Angio Abd/Pel w/ and/or w/o  Result Date: 08/12/2021 CLINICAL DATA:  Status post type 2 endoleak repair with IMA embolization EXAM: CT ANGIOGRAPHY ABDOMEN AND PELVIS WITH CONTRAST AND WITHOUT CONTRAST TECHNIQUE: Multidetector CT imaging of the abdomen and pelvis was performed using the standard protocol during bolus administration of intravenous contrast. Multiplanar reconstructed images and MIPs were obtained and reviewed to evaluate the vascular anatomy. CONTRAST:  118mL OMNIPAQUE IOHEXOL 350 MG/ML SOLN COMPARISON:  08/19/2020 FINDINGS: VASCULAR Aorta: Patient is status post infrarenal bifurcated stent graft repair of the AAA. No definitive recurrent endoleak by single-phase imaging. Unfortunately, portal venous phase imaging was not performed. Stable native aneurysm sac diameters measuring 6.4 x 4.7 cm, previously 6.5 x 4.8 cm. Bifurcated stent graft remains patent. No thrombus or occlusive process. No surrounding aortic inflammatory changes, or retroperitoneal hemorrhage. Celiac: Atherosclerotic origin but remains patent including its branches SMA: SMA remains widely patent including its branches Renals: Atherosclerotic origins but remain patent. No accessory renal artery. IMA: Previously coiled ostium with coils extending into  the native aneurysm sac. Distal branches are reconstituted via SMA collateral pathways. Inflow: Left iliac limb extends  into the left common iliac artery. Right iliac limb extends into the external iliac artery. Previous coil embolization of the right internal iliac artery. Iliac vessels remain patent. No inflow disease or occlusion. 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 No significant change. Proximal Outflow: Atherosclerotic changes about the common, profunda femoral, and superficial femoral arteries all remain patent. Veins: Dedicated venous phase imaging not performed today. Review of the MIP images confirms the above findings. NON-VASCULAR Lower chest: Cardiomegaly. No pericardial effusion. Clear lung bases. Hepatobiliary: Limited single-phase imaging only. Mild hepatic steatosis. No focal large hepatic abnormality or biliary obstruction. Gallbladder collapsed but does contains small calcified stones. Common bile duct nondilated. Pancreas: Unremarkable. No pancreatic ductal dilatation or surrounding inflammatory changes. Spleen: Normal size.  Small calcified granulomas noted. Adrenals/Urinary Tract: Normal adrenal glands. Similar subcentimeter nonobstructing nephrolithiasis. No hydronephrosis. Small right kidney cortical cysts. 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 Upper pole moiety has a nonobstructing 5 mm calculus as before, image 55 series 5. Ureters are symmetric and decompressed. No hydroureter. Bladder unremarkable. Stomach/Bowel: Stomach is within normal limits. Appendix appears normal. No evidence of bowel wall thickening, distention, or inflammatory changes. Lymphatic: No adenopathy Reproductive: Prostate radiation seeds noted. Other: No abdominal wall hernia or abnormality. No abdominopelvic ascites. Musculoskeletal: Degenerative changes noted of the spine. No acute osseous finding. IMPRESSION: VASCULAR Stable stent graft repair of the infrarenal AAA including IMA coil embolization and right internal iliac coil  embolization. No definitive endoleak by single phase arterial imaging. Stable native aneurysm sac diameter at 6.4 x 4.7 cm. NON-VASCULAR No other acute intra-abdominal or pelvic finding. Mild hepatic steatosis Cholelithiasis Nonobstructing nephrolithiasis Electronically Signed   By: Jerilynn Mages.  Shick M.D.   On: 08/12/2021 10:15    Labs:  CBC: No results for input(s): WBC, HGB, HCT, PLT in the last 8760 hours.  COAGS: No results for input(s): INR, APTT in the last 8760 hours.  BMP: Recent Labs    08/19/20 0830 04/19/21 0829 08/12/21 0906  NA  --  136  --   K  --  4.3  --   CL  --  97  --   CO2  --  21  --   GLUCOSE  --  180*  --   BUN  --  44*  --   CALCIUM  --  10.2  --   CREATININE 1.70* 2.02* 1.50*    LIVER FUNCTION TESTS: Recent Labs    04/19/21 0829  BILITOT 0.6  AST 16  ALT 19  ALKPHOS 101  PROT 6.5  ALBUMIN 4.4    TUMOR MARKERS: No results for input(s): AFPTM, CEA, CA199, CHROMGRNA in the last 8760 hours.  Assessment and Plan:  80 year old male with history of infrarenal abdominal aortic aneurysm status post aortobiliac endovascular repair in 2013 followed by type 2 endoleak repair in 2016.  Endoleak resolved these past 2 years.  Sac size is stable. He is clinically doing very well.   Follow up in 1 year with repeat CTA for surveillance of AAA.   Electronically Signed: Criselda Peaches 08/16/2021, 3:31 PM   I spent a total of 15 Minutes in face to face in clinical consultation, greater than 50% of which was counseling/coordinating care for Type 2 endoleak.

## 2021-08-17 DIAGNOSIS — I129 Hypertensive chronic kidney disease with stage 1 through stage 4 chronic kidney disease, or unspecified chronic kidney disease: Secondary | ICD-10-CM | POA: Diagnosis not present

## 2021-08-17 DIAGNOSIS — N179 Acute kidney failure, unspecified: Secondary | ICD-10-CM | POA: Diagnosis not present

## 2021-08-17 DIAGNOSIS — Z23 Encounter for immunization: Secondary | ICD-10-CM | POA: Diagnosis not present

## 2021-08-17 DIAGNOSIS — Z791 Long term (current) use of non-steroidal anti-inflammatories (NSAID): Secondary | ICD-10-CM | POA: Diagnosis not present

## 2021-08-17 DIAGNOSIS — N1832 Chronic kidney disease, stage 3b: Secondary | ICD-10-CM | POA: Diagnosis not present

## 2021-09-29 ENCOUNTER — Ambulatory Visit: Payer: Medicare Other | Admitting: Internal Medicine

## 2021-09-29 ENCOUNTER — Encounter: Payer: Self-pay | Admitting: Internal Medicine

## 2021-09-29 ENCOUNTER — Other Ambulatory Visit: Payer: Self-pay

## 2021-09-29 VITALS — BP 132/80 | HR 60 | Ht 70.0 in | Wt 204.0 lb

## 2021-09-29 DIAGNOSIS — E781 Pure hyperglyceridemia: Secondary | ICD-10-CM | POA: Diagnosis not present

## 2021-09-29 DIAGNOSIS — N1832 Chronic kidney disease, stage 3b: Secondary | ICD-10-CM | POA: Diagnosis not present

## 2021-09-29 DIAGNOSIS — E1122 Type 2 diabetes mellitus with diabetic chronic kidney disease: Secondary | ICD-10-CM | POA: Diagnosis not present

## 2021-09-29 LAB — POCT GLYCOSYLATED HEMOGLOBIN (HGB A1C): Hemoglobin A1C: 6.9 % — AB (ref 4.0–5.6)

## 2021-09-29 MED ORDER — GLIPIZIDE 5 MG PO TABS: 2.5000 mg | ORAL_TABLET | Freq: Two times a day (BID) | ORAL | 3 refills | Status: DC

## 2021-09-29 NOTE — Progress Notes (Signed)
Name: Garrett Jordan  Age/ Sex: 80 y.o., male   MRN/ DOB: [id number], [birth year]     PCP: Antony Contras, MD   Reason for Endocrinology Evaluation: Type 2 Diabetes Mellitus     Initial Endocrinology Clinic Visit:  03/17/2020    PATIENT IDENTIFIER: [name]. is a 80 y.o. male with a past medical history of T2DM, RLS, CAD and CKD. The patient has followed with Endocrinology clinic since 03/17/2020 for consultative assistance with management of his diabetes.  DIABETIC HISTORY:  [name] was diagnosed with T2 DM in 2019. Invokana- cost prohibitive . His hemoglobin A1c has ranged from 6.7% in 2020, peaking at 7.8 % in 2021    On his initial visit to our clinic his A1c was 6.5 % , he was on Januvia which was continued, Invokana was cost prohibitive and we switched it to Glipizide.   Januvia stopped 06/2020 due to high cost for pt  Jardiance started by nephrology by 07/2020    HYPERCALCEMIA HISTORY: [name] indicates that he was first noted with hypercalcemia in 2020. He has hx of kidney stones  He is S/P Left parathyroidectomy in 1970's  Ca/Cr ratio 0.0210  SUBJECTIVE:   During the last visit (03/31/2021): A1c 6.1% We continued Jardiance and decreased Glipizide    Today (09/29/2021): [name] is here for a follow up on diabetes management.  He checks his blood sugars 1-2 times daily . The patient has not had hypoglycemic episodes since the last clinic visit.    Follows with Dr. Marlou Porch , he was started on Fenofibrate by Dr. Marlou Porch and recently Zetia  was added 07/2021  He tells me he was advised to stop farxiga by a provider, I am unable to find a note in his chart about this, pt denies side effects to it but he does admit that it was cost prohibitive     HOME DIABETES REGIMEN:  Glipizide 5 mg, half a tablet before supper  Farxiga  10 mg daily - not taking  Rosuvastatin 20 mg , Half a tablet Monday and Thursday  Vascepa 1 g, 2 caps twice daily   Fenofibrate 48 mg daily  Zetia 10 mg daily    Statin: yes ACE-I/ARB: yes     METER DOWNLOAD SUMMARY: Unable to download  [id number]  DIABETIC COMPLICATIONS: Microvascular complications:  CKD III Denies: retinopathy, neuropathy  Last Eye Exam: Completed 01/2021  Macrovascular complications:  CAD Denies: CVA, PVD       PHYSICAL EXAM: VS: BP 132/80 (BP Location: Left Arm, Patient Position: Sitting, Cuff Size: Small)   Pulse 60   Ht 5\' 10"  (1.778 m)   Wt 204 lb (92.5 kg)   SpO2 99%   BMI 29.27 kg/m    EXAM: General: Pt appears well and is in NAD  Lungs: Clear with good BS bilat with no rales, rhonchi, or wheezes  Heart: Auscultation: RRR  Abdomen: Normoactive bowel sounds, soft, nontender, without masses or organomegaly palpable  Extremities:  BL ZJ:IRCVE edema B/L   Mental Status: Judgment, insight: intact Orientation: oriented to time, place, and person Mood and affect: no depression, anxiety, or agitation    DM Foot Exam 03/31/2021  The skin of the feet is intact without sores or ulcerations. The pedal pulses are 1+ on right and 1+ on left. The sensation is intact to a screening 5.07, 10 gram monofilament bilaterally    HISTORY:  Past Medical History:  Past Medical History:  Diagnosis Date   AAA (abdominal aortic aneurysm)    Arthritis    knees   Atrial fibrillation (HCC)    CAD (coronary artery disease)    Cancer (HCC)    prostate   Diabetes mellitus without complication (HCC)    GERD (gastroesophageal reflux disease)    Gout    Hyperlipidemia    Hypertension    Hypothyroidism    Kidney stone    right kidney   Myocardial infarction ([place]) 01/1991   sp inferior   Nerve compression    right leg   Reflux    Seasonal allergies    Thyroid disease    hypothyroidism   Past Surgical History:  Past Surgical History:  Procedure Laterality Date   ABDOMINAL AORTAGRAM N/A 02/28/2012   Procedure: ABDOMINAL Maxcine Ham;  Surgeon: Serafina Mitchell, MD;   Location: [facility] CATH LAB;  Service: Cardiovascular;  Laterality: N/A;   abdominal aortagram embolization  02/28/2012   ABDOMINAL AORTIC ANEURYSM REPAIR  Sept. 2013   CORONARY ARTERY BYPASS GRAFT  04/2009   CORONARY STENT PLACEMENT  1992 and  2009   RCA   EMBOLIZATION Right 02/28/2012   Procedure: EMBOLIZATION;  Surgeon: Serafina Mitchell, MD;  Location: [facility] CATH LAB;  Service: Cardiovascular;  Laterality: Right;   IR GENERIC HISTORICAL  12/05/2016   IR RADIOLOGIST EVAL & MGMT 12/05/2016 Jacqulynn Cadet, MD [facility] INTERV RAD   IR RADIOLOGIST EVAL & MGMT  07/10/2019   IR RADIOLOGIST EVAL & MGMT  08/25/2020   IR RADIOLOGIST EVAL & MGMT  08/16/2021   KIDNEY STONE SURGERY     LEFT HEART CATHETERIZATION WITH CORONARY/GRAFT ANGIOGRAM N/A 01/08/2015   Procedure: LEFT HEART CATHETERIZATION WITH Beatrix Fetters;  Surgeon: Jacolyn Reedy, MD;  Location: [facility] CATH LAB;  Service: Cardiovascular;  Laterality: N/A;   parathyroid adenoma     PROSTATE SURGERY     rad seeds   SPINE SURGERY     TONSILLECTOMY     Social History:  reports that he quit smoking about 51 years ago. His smoking use included cigarettes. He has never used smokeless tobacco. He reports current alcohol use of about 6.0 - 9.0 standard drinks per week. He reports that he does not use drugs. Family History:  Family History  Problem Relation Age of Onset   Heart disease Father        Heart Disease before age 23   Heart attack Father    Hyperlipidemia Father    Hypertension Father    Stroke Mother    Deep vein thrombosis Mother    Heart disease Sister    Diabetes Sister    Hyperlipidemia Sister    Hypertension Sister    Heart attack Sister    Heart disease Paternal Uncle    Diabetes Maternal Grandmother    Diabetes Sister      HOME MEDICATIONS: Allergies as of 09/29/2021       Reactions   Ace Inhibitors Cough   Crestor [rosuvastatin] Other (See Comments)   Muscle aches   Nitroglycerin Other (See Comments)   Very  pronounced lowered BP with IV nitro for caths   Quinolones    Aortic root enlargement        Medication List        Accurate as of September 29, 2021  7:35 AM. If you have any questions, ask your nurse or doctor.          Accu-Chek Guide test strip Generic drug: glucose blood USE TWICE DAILY  allopurinol 300 MG tablet Commonly known as: ZYLOPRIM Take 300 mg by mouth every other day. Take in the evening - on even days of the month   amLODipine 10 MG tablet Commonly known as: NORVASC TAKE 1 TABLET BY MOUTH  DAILY   apixaban 5 MG Tabs tablet Commonly known as: ELIQUIS Take 1 tablet (5 mg total) by mouth 2 (two) times daily.   clonazePAM 1 MG tablet Commonly known as: KLONOPIN Take 0.5 mg by mouth at bedtime.   clopidogrel 75 MG tablet Commonly known as: PLAVIX TAKE 1 TABLET BY MOUTH  DAILY   Coenzyme Q10 300 MG Caps Take by mouth.   ezetimibe 10 MG tablet Commonly known as: ZETIA Take 1 tablet (10 mg total) by mouth daily.   Farxiga 10 MG Tabs tablet Generic drug: dapagliflozin propanediol Take 10 mg by mouth daily.   fenofibrate 48 MG tablet Commonly known as: Tricor Take 1 tablet (48 mg total) by mouth daily.   fluorouracil 5 % cream Commonly known as: EFUDEX Use as directed   fluticasone 50 MCG/ACT nasal spray Commonly known as: FLONASE Place 2 sprays into both nostrils daily as needed for allergies.   glipiZIDE 5 MG tablet Commonly known as: GLUCOTROL Take 0.5 tablets (2.5 mg total) by mouth daily before supper.   ibuprofen 200 MG tablet Commonly known as: ADVIL Take 200 mg by mouth as needed.   levothyroxine 175 MCG tablet Commonly known as: SYNTHROID Take 175 mcg by mouth daily before breakfast.   loratadine-pseudoephedrine 10-240 MG 24 hr tablet Commonly known as: CLARITIN-D 24-hour Take 1 tablet by mouth at bedtime.   metoprolol tartrate 25 MG tablet Commonly known as: LOPRESSOR TAKE 1 TABLET BY MOUTH  TWICE DAILY    olmesartan-hydrochlorothiazide 40-25 MG tablet Commonly known as: BENICAR HCT TAKE 1 TABLET BY MOUTH  DAILY   pantoprazole 40 MG tablet Commonly known as: PROTONIX TAKE 1 TABLET BY MOUTH  DAILY   PreserVision AREDS 2 Caps Take 1 capsule by mouth 2 (two) times daily.   rosuvastatin 10 MG tablet Commonly known as: CRESTOR Take 10 mg by mouth as directed. Per patient, takes Mondays & Thursdays   tamsulosin 0.4 MG Caps capsule Commonly known as: FLOMAX Take 0.4 mg by mouth every other day. Take in the evening - on even days of the month   triamcinolone cream 0.1 % Commonly known as: KENALOG Apply 1 application topically as needed.   Vascepa 1 g capsule Generic drug: icosapent Ethyl Take 2 capsules (2 g total) by mouth 2 (two) times daily.   vitamin B-12 1000 MCG tablet Commonly known as: CYANOCOBALAMIN Take 2,500 mcg by mouth every other day.   Vitamin D3 50 MCG (2000 UT) Tabs Take 2,000 Units by mouth daily.        DM foot exam: 03/17/2020   The skin of the feet is intact without sores or ulcerations. The pedal pulses are 2+ on right and 2+ on left. The sensation is intact to a screening 5.07, 10 gram monofilament bilaterally    DATA REVIEWED:  Lab Results  Component Value Date   HGBA1C 6.1 (A) 03/31/2021   HGBA1C 6.1 (A) 10/18/2020   HGBA1C 6.5 (A) 03/17/2020   07/15/2021 LDL 64 Tg 278 BUN/Cr 26/1.86 GFR 36 Ca 9.99       12/17/2019 A1c 7.8%  BUN/Cr 31/1.68 GFR 40 Calcium 10.9 (corrected calcium 10.41) Tg 946  HDL 25 LDL 52 Vitamin D 37.4   12/16/2020 BUN/Cr 23/1.58 GFR 43 Na 139  K 4.2 Ca 10.3 Corrected 9.81 Alb 4.5  T. Chol 134 HDL 30 Tg 435 LDL 41 TSH 2.34       ASSESSMENT / PLAN / RECOMMENDATIONS:   1) Type 2 Diabetes Mellitus, Optimally controlled, With CKD III complications - Most recent A1c of 6.9 %. Goal A1c < 7.0%.   -His A1c has increased from 6.1 %  to 6.9%  - He has been off farxiga for months, its cost prohibitive, he  was provided with pt assistance program.  - In review of his meter download his BG's have been high up to 300 mg/dL  - Will increase Glipizide as below, while he is off [place], he understands once farxiga started he will probably need less Glipizide.    MEDICATIONS:  Increase  glipizide 5 mg, half a tablet before Breakfast and  Supper  Restart Farxiga 10 mg daily     EDUCATION / INSTRUCTIONS: BG monitoring instructions: Patient is instructed to check his blood sugars 2 times a day, before breakfast and supper . Call [facility] if: BG persistently < 70  I reviewed the Rule of 15 for the treatment of hypoglycemia in detail with the patient. Literature supplied.     2) Diabetic complications:  Eye: Does not have known diabetic retinopathy.  Neuro/ Feet: Does not have known diabetic peripheral neuropathy .  Renal: Patient does  have known baseline CKD. He   is on an ACEI/ARB at present.     3) Hypertriglyceridemia: Patient is on rosuvastatin twice a week due to myalgia. He is on  Vascepa as well started in 2021.  Tg in 12/2019 was 946 mg/dl , repeat Tg decreased to 278 mg/dL by 07/2021 - Zetia started 07/2021 by Cardiology    Medications  Rosuvastatin 20 mg , Half a tablet Monday and Thursday  Vascepa 1 g, 2 caps twice daily  Fenofibrate 48 mg daily  Zetia 10 mg daily     F/U in 6 months    Signed electronically by: Mack Guise, MD  [facility]  [address] [place],  [address] Phone: [phone] FAX: [phone]   CC: Antony Contras, [place] [address] Phone: [phone]  Fax: [phone]  Return to Endocrinology clinic as below: Future Appointments  Date Time Provider [place]  09/29/2021  7:50 AM Gelsey Amyx, Melanie Crazier, MD [facility] None  01/23/2022  8:40 AM Jerline Pain, MD [facility]

## 2021-09-29 NOTE — Patient Instructions (Signed)
-   Increase  Glipizide 5 mg , to HALF a tablet before Breakfast and Half a tablet before Supper       HOW TO TREAT LOW BLOOD SUGARS (Blood sugar LESS THAN 70 MG/DL) Please follow the RULE OF 15 for the treatment of hypoglycemia treatment (when your (blood sugars are less than 70 mg/dL)   STEP 1: Take 15 grams of carbohydrates when your blood sugar is low, which includes:  3-4 GLUCOSE TABS  OR 3-4 OZ OF JUICE OR REGULAR SODA OR ONE TUBE OF GLUCOSE GEL    STEP 2: RECHECK blood sugar in 15 MINUTES STEP 3: If your blood sugar is still low at the 15 minute recheck --> then, go back to STEP 1 and treat AGAIN with another 15 grams of carbohydrates.

## 2021-10-17 ENCOUNTER — Encounter: Payer: Self-pay | Admitting: Internal Medicine

## 2021-10-18 ENCOUNTER — Telehealth: Payer: Self-pay

## 2021-10-18 NOTE — Telephone Encounter (Signed)
Patient Assistance application completed by patient and provider. Forms faxed to  [phone].

## 2021-10-18 NOTE — Telephone Encounter (Signed)
Mr. Dobratz dropped off information for the Patient Assistance Program. Forms in Dr. Kelton Pillar in-box.

## 2021-10-18 NOTE — Telephone Encounter (Signed)
Spoke with [employer] and they need more income and insurance information to process application. Patient has been notified and will bring additional documents to office.

## 2021-10-18 NOTE — Telephone Encounter (Signed)
Will refax forms tomorrow when I get to [facility] office

## 2021-10-19 NOTE — Telephone Encounter (Signed)
Patient assistance refax with added documents and confirmed receipt at 8:03am

## 2021-11-17 ENCOUNTER — Telehealth: Payer: Self-pay

## 2021-11-17 NOTE — Telephone Encounter (Signed)
[employer] patient assistance for [place] was denied because patient exceeds the program limit.

## 2021-11-24 ENCOUNTER — Other Ambulatory Visit: Payer: Self-pay | Admitting: Cardiology

## 2021-12-08 ENCOUNTER — Other Ambulatory Visit: Payer: Self-pay | Admitting: Cardiology

## 2022-01-04 ENCOUNTER — Other Ambulatory Visit: Payer: Self-pay | Admitting: Cardiology

## 2022-01-04 DIAGNOSIS — I480 Paroxysmal atrial fibrillation: Secondary | ICD-10-CM

## 2022-01-04 NOTE — Telephone Encounter (Signed)
Eliquis 5mg  refill request received. Patient is 81 years old, weight-92.5kg, Crea-1.50 on 08/12/2021, Diagnosis-Afib, and last seen by Dr. Candee Furbish on 07/15/2021. Also, per 07/15/2021 [place], OV note His creatinine is 1.8 however his weight is normal.  Age is 86.  At age 81, we would recommend decreasing his Eliquis to 2.5 mg twice a day. Dose is appropriate based on dosing criteria. Will send in refill to requested pharmacy.

## 2022-01-16 ENCOUNTER — Other Ambulatory Visit: Payer: Self-pay | Admitting: Surgery

## 2022-01-16 DIAGNOSIS — M5416 Radiculopathy, lumbar region: Secondary | ICD-10-CM | POA: Diagnosis not present

## 2022-01-16 DIAGNOSIS — I4891 Unspecified atrial fibrillation: Secondary | ICD-10-CM | POA: Diagnosis not present

## 2022-01-16 DIAGNOSIS — Z0000 Encounter for general adult medical examination without abnormal findings: Secondary | ICD-10-CM | POA: Diagnosis not present

## 2022-01-16 DIAGNOSIS — E1122 Type 2 diabetes mellitus with diabetic chronic kidney disease: Secondary | ICD-10-CM | POA: Diagnosis not present

## 2022-01-16 DIAGNOSIS — E559 Vitamin D deficiency, unspecified: Secondary | ICD-10-CM | POA: Diagnosis not present

## 2022-01-16 DIAGNOSIS — E538 Deficiency of other specified B group vitamins: Secondary | ICD-10-CM | POA: Diagnosis not present

## 2022-01-16 DIAGNOSIS — N184 Chronic kidney disease, stage 4 (severe): Secondary | ICD-10-CM | POA: Diagnosis not present

## 2022-01-16 DIAGNOSIS — I10 Essential (primary) hypertension: Secondary | ICD-10-CM | POA: Diagnosis not present

## 2022-01-16 DIAGNOSIS — E039 Hypothyroidism, unspecified: Secondary | ICD-10-CM | POA: Diagnosis not present

## 2022-01-16 DIAGNOSIS — Z1389 Encounter for screening for other disorder: Secondary | ICD-10-CM | POA: Diagnosis not present

## 2022-01-16 DIAGNOSIS — I700 Atherosclerosis of aorta: Secondary | ICD-10-CM | POA: Diagnosis not present

## 2022-01-16 DIAGNOSIS — M109 Gout, unspecified: Secondary | ICD-10-CM | POA: Diagnosis not present

## 2022-01-16 DIAGNOSIS — E782 Mixed hyperlipidemia: Secondary | ICD-10-CM | POA: Diagnosis not present

## 2022-01-16 DIAGNOSIS — I7121 Aneurysm of the ascending aorta, without rupture: Secondary | ICD-10-CM

## 2022-01-16 DIAGNOSIS — Z7984 Long term (current) use of oral hypoglycemic drugs: Secondary | ICD-10-CM | POA: Diagnosis not present

## 2022-01-17 ENCOUNTER — Encounter: Payer: Self-pay | Admitting: Cardiology

## 2022-01-23 ENCOUNTER — Other Ambulatory Visit: Payer: Self-pay

## 2022-01-23 ENCOUNTER — Ambulatory Visit: Payer: Medicare Other | Admitting: Cardiology

## 2022-01-23 ENCOUNTER — Encounter: Payer: Self-pay | Admitting: Cardiology

## 2022-01-23 DIAGNOSIS — E782 Mixed hyperlipidemia: Secondary | ICD-10-CM | POA: Diagnosis not present

## 2022-01-23 DIAGNOSIS — D6869 Other thrombophilia: Secondary | ICD-10-CM | POA: Insufficient documentation

## 2022-01-23 DIAGNOSIS — I4891 Unspecified atrial fibrillation: Secondary | ICD-10-CM | POA: Diagnosis not present

## 2022-01-23 DIAGNOSIS — I480 Paroxysmal atrial fibrillation: Secondary | ICD-10-CM | POA: Diagnosis not present

## 2022-01-23 DIAGNOSIS — I2510 Atherosclerotic heart disease of native coronary artery without angina pectoris: Secondary | ICD-10-CM | POA: Diagnosis not present

## 2022-01-23 NOTE — Progress Notes (Signed)
Cardiology Office Note:    Date:  01/23/2022   ID:  Garrett Graves Sr., DOB [birth year], MRN [id number]  PCP:  [name], MD   [facility] Providers Cardiologist:  Garrett Schultz, MD     Referring MD: [name], MD    History of Present Illness:    Garrett Levar. is a 81 y.o. male here for follow-up CAD prior CABG 2010 abdominal aortic aneurysm stent graft Dr. 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  Prior lipid panel showed triglycerides of 435 and LDL of 41, most recently 361 and 57.  Vascepa.  Crestor Monday and Thursday and Zetia.  Family wise his son had aortic dissection.  Now doing well.  Prior EKG showed atrial fibrillation 65 right bundle branch block.  Rate controlled.  Has been on both Plavix and Eliquis.  No bleeding.  Hemoglobin 15.7.  He has not been as active as he used to be.  Back pain.  He is seeing orthopedics soon.  Dr. Karl Jordan with interventional radiology has been monitoring his endoleak.  Past Medical History:  Diagnosis Date   AAA (abdominal aortic aneurysm)    Arthritis    knees   Atrial fibrillation (HCC)    CAD (coronary artery disease)    Cancer (HCC)    prostate   Diabetes mellitus without complication (HCC)    GERD (gastroesophageal reflux disease)    Gout    Hyperlipidemia    Hypertension    Hypothyroidism    Kidney stone    right kidney   Myocardial infarction (HCC) 01/1991   sp inferior   Nerve compression    right leg   Reflux    Seasonal allergies    Thyroid disease    hypothyroidism    Past Surgical History:  Procedure Laterality Date   ABDOMINAL AORTAGRAM N/A 02/28/2012   Procedure: ABDOMINAL Ronny Flurry;  Surgeon: Garrett Libman, MD;  Location: [facility] CATH LAB;  Service: Cardiovascular;  Laterality: N/A;   abdominal aortagram embolization  02/28/2012   ABDOMINAL AORTIC ANEURYSM REPAIR  Sept. 2013   CORONARY ARTERY  BYPASS GRAFT  04/2009   CORONARY STENT PLACEMENT  1992 and  2009   RCA   EMBOLIZATION Right 02/28/2012   Procedure: EMBOLIZATION;  Surgeon: Garrett Libman, MD;  Location: [facility] CATH LAB;  Service: Cardiovascular;  Laterality: Right;   IR GENERIC HISTORICAL  12/05/2016   IR RADIOLOGIST EVAL & MGMT 12/05/2016 Garrett Moan, MD [facility] INTERV RAD   IR RADIOLOGIST EVAL & MGMT  07/10/2019   IR RADIOLOGIST EVAL & MGMT  08/25/2020   IR RADIOLOGIST EVAL & MGMT  08/16/2021   KIDNEY STONE SURGERY     LEFT HEART CATHETERIZATION WITH CORONARY/GRAFT ANGIOGRAM N/A 01/08/2015   Procedure: LEFT HEART CATHETERIZATION WITH Isabel Caprice;  Surgeon: Garrett Boyer, MD;  Location[facility] C[facility] LAB;  Service: Cardiovascular;  Laterality: N/A;   parathyroid adenoma     PROSTATE SURGERY     rad seeds   SPINE SURGERY     TONSILLECTOMY      Current Medications: Current Meds  Medication Sig   acetaminophen (TYLENOL) 650 MG CR tablet 650 mg every 8 (eight) hours as needed for pain.   allopurinol (ZYLOPRIM) 300 MG tablet Take 300 mg by mouth every other day. Take in the evening - on even days of the month   amLODipine (NORVASC) 10 MG tablet TAKE 1 TABLET BY MOUTH  DAILY   Cholecalciferol (VITAMIN D3) 2000 UNITS TABS Take 2,000 Units by mouth daily.    clonazePAM (KLONOPIN) 1 MG tablet Take 0.5 mg by mouth at bedtime.   clopidogrel (PLAVIX) 75 MG tablet TAKE 1 TABLET BY MOUTH  DAILY   Coenzyme Q10 300 MG CAPS Take by mouth.   ELIQUIS 5 MG TABS tablet TAKE 1 TABLET BY MOUTH TWICE A DAY   ezetimibe (ZETIA) 10 MG tablet Take 1 tablet (10 mg total) by mouth daily.   FARXIGA 10 MG TABS tablet Take 10 mg by mouth daily.   fenofibrate (TRICOR) 48 MG tablet TAKE 1 TABLET BY MOUTH  DAILY   fluorouracil (EFUDEX) 5 % cream Use as directed   fluticasone (FLONASE) 50 MCG/ACT nasal spray Place 2 sprays into both nostrils daily as needed for allergies.   glipiZIDE (GLUCOTROL) 5 MG tablet Take 0.5 tablets (2.5 mg total) by  mouth 2 (two) times daily before a meal.   glucose blood (ACCU-CHEK GUIDE) test strip USE TWICE DAILY   levothyroxine (SYNTHROID, LEVOTHROID) 175 MCG tablet Take 175 mcg by mouth daily before breakfast.   loratadine-pseudoephedrine (CLARITIN-D 24-HOUR) 10-240 MG per 24 hr tablet Take 1 tablet by mouth at bedtime.   metoprolol tartrate (LOPRESSOR) 25 MG tablet TAKE 1 TABLET BY MOUTH  TWICE DAILY   Multiple Vitamins-Minerals (PRESERVISION AREDS 2) CAPS Take 1 capsule by mouth 2 (two) times daily.   olmesartan-hydrochlorothiazide (BENICAR HCT) 40-25 MG tablet TAKE 1 TABLET BY MOUTH  DAILY   pantoprazole (PROTONIX) 40 MG tablet TAKE 1 TABLET BY MOUTH  DAILY   rosuvastatin (CRESTOR) 10 MG tablet Take 10 mg by mouth as directed. Per patient, takes Mondays & Thursdays   Tamsulosin HCl (FLOMAX) 0.4 MG CAPS Take 0.4 mg by mouth every other day. Take in the evening - on even days of the month   triamcinolone cream (KENALOG) 0.1 % Apply 1 application topically as needed.   TURMERIC PO Take by mouth. Patient chew 2 gummies by mouth two times daily   VASCEPA 1 g capsule Take 2 capsules (2 g total) by mouth 2 (two) times daily.   vitamin B-12 (CYANOCOBALAMIN) 1000 MCG tablet Take 2,500 mcg by mouth every other day.      Allergies:   Ace inhibitors, Crestor [rosuvastatin], Nitroglycerin, and Quinolones   Social History   Socioeconomic History   Marital status: Divorced    Spouse name: Not on file   Number of children: Not on file   Years of education: Not on file   Highest education level: Not on file  Occupational History   Not on file  Tobacco Use   Smoking status: Former    Types: Cigarettes    Quit date: 08/01/1970    Years since quitting: 51.5   Smokeless tobacco: Never  Vaping Use   Vaping Use: Never used  Substance and Sexual Activity   Alcohol use: Yes    Alcohol/week: 6.0 - 9.0 standard drinks    Types: 4 - 5 Glasses of wine, 2 - 4 Cans of beer per week    Comment: 1-3 glasses 3-4  times a week   Drug use: No   Sexual activity: Not on file  Other Topics Concern   Not on file  Social History Narrative   Not on file   Social Determinants of Health   Financial Resource Strain: Not on file  Food Insecurity: Not on file  Transportation Needs: Not on file  Physical Activity: Not on file  Stress: Not on file  Social Connections: Not on file     Family History: The patient's family history includes Deep vein thrombosis in his mother; Diabetes in his maternal grandmother, sister, and sister; Heart attack in his father and sister; Heart disease in his father, paternal uncle, and sister; Hyperlipidemia in his father and sister; Hypertension in his father and sister; Stroke in his mother.  ROS:   Please see the history of present illness.     All other systems reviewed and are negative.  EKGs/Labs/Other Studies Reviewed:    The following studies were reviewed today: CT of chest 02/2019 2-4.1 cm ascending thoracic aorta  Nuclear stress test 2021-low risk study basal inferior mid inferior defect.  EF 50%  ZIO monitor 2021-sinus pause 3.1 to 3.8 seconds.  This is during daytime hours.  We stopped his metoprolol because of the pauses.  EP saw him as well.  He is now back on low-dose metoprolol  Cardiac catheterization 2016 - Occluded SVG to PDA.  Other grafts patent.  Medical management.  EKG: As above  Recent Labs: 04/19/2021: ALT 19; BUN 44; Potassium 4.3; Sodium 136 08/12/2021: Creatinine, Ser 1.50  Recent Lipid Panel    Component Value Date/Time   CHOL 137 07/15/2021 0912   TRIG 278 (H) 07/15/2021 0912   HDL 29 (L) 07/15/2021 0912   CHOLHDL 4.7 07/15/2021 0912   CHOLHDL 6.7 01/08/2015 0404   VLDL 64 (H) 01/08/2015 0404   LDLCALC 64 07/15/2021 0912   LDLDIRECT 67 07/15/2021 0912     Risk Assessment/Calculations:              Physical Exam:    VS:  BP 120/60 (BP Location: Left Arm, Patient Position: Sitting, Cuff Size: Normal)   Pulse 84   Ht 5'  9.5" (1.765 m)   Wt 203 lb 12.8 oz (92.4 kg)   SpO2 97%   BMI 29.66 kg/m     Wt Readings from Last 3 Encounters:  01/23/22 203 lb 12.8 oz (92.4 kg)  09/29/21 204 lb (92.5 kg)  07/15/21 190 lb (86.2 kg)     GEN:  Well nourished, well developed in no acute distress HEENT: Normal NECK: No JVD; No carotid bruits LYMPHATICS: No lymphadenopathy CARDIAC: Irregularly irregular, no murmurs, no rubs, gallops RESPIRATORY:  Clear to auscultation without rales, wheezing or rhonchi  ABDOMEN: Soft, non-tender, non-distended MUSCULOSKELETAL:  No edema; No deformity  SKIN: Warm and dry NEUROLOGIC:  Alert and oriented x 3 PSYCHIATRIC:  Normal affect   ASSESSMENT:    1. Coronary artery disease involving native coronary artery of native heart without angina pectoris   2. Mixed hyperlipidemia   3. Paroxysmal atrial fibrillation (HCC)   4. Hypercoagulable state due to atrial fibrillation (HCC)    PLAN:    In order of problems listed above:  CAD (coronary artery disease) Old inferior MI in 1992.  Bypass June 2010.  Occluded vein graft to posterior lateral.  Continue with medical management goal-directed medical therapy.  No anginal symptoms.  Hyperlipidemia Struggles with high triglycerides.  Currently on Vascepa, Crestor Mondays and Thursdays 10 mg and Zetia 10 mg a day.  Last LDL 57 triglycerides 361 on 01/16/2022 from outside labs.  Excellent.  No myalgias.  Diet, exercise.  Paroxysmal atrial fibrillation (HCC) Okay rate control.  Doing well.  Low-dose metoprolol.  No changes made.  No syncope.  Hypercoagulable state due to atrial fibrillation (HCC) Watch for any signs of bleeding on both Plavix as well as Eliquis.  Overall doing well.  Hemoglobin from outside lab 15.7 creatinine 1.7.  He reports no bleeding or bruising  Endoleak post endovascular aneurysm repair [facility]) Followed by Dr. Archer Jordan         Medication Adjustments/Labs and Tests Ordered: Current medicines are reviewed at  length with the patient today.  Concerns regarding medicines are outlined above.  No orders of the defined types were placed in this encounter.  No orders of the defined types were placed in this encounter.   Patient Instructions  Medication Instructions:  The current medical regimen is effective;  continue present plan and medications.  *If you need a refill on your cardiac medications before your next appointment, please call your pharmacy*  Follow-Up: At [facility], you and your health needs are our priority.  As part of our continuing mission to provide you with exceptional heart care, we have created designated Provider Care Teams.  These Care Teams include your primary Cardiologist (physician) and Advanced Practice Providers (APPs -  Physician Assistants and Nurse Practitioners) who all work together to provide you with the care you need, when you need it.  We recommend signing up for the patient portal called "MyChart".  Sign up information is provided on this After Visit Summary.  MyChart is used to connect with patients for Virtual Visits (Telemedicine).  Patients are able to view lab/test results, encounter notes, upcoming appointments, etc.  Non-urgent messages can be sent to your provider as well.   To learn more about what you can do with MyChart, go to [email or web address].    Your next appointment:   1 year(s)  The format for your next appointment:   In Person  Provider:   Donato Schultz, MD {  Thank you for choosing [facility]!!      Signed, Garrett Schultz, MD  01/23/2022 9:14 AM    [facility]

## 2022-01-23 NOTE — Assessment & Plan Note (Signed)
Struggles with high triglycerides.  Currently on Vascepa, Crestor Mondays and Thursdays 10 mg and Zetia 10 mg a day.  Last LDL 57 triglycerides 361 on 01/16/2022 from outside labs.  Excellent.  No myalgias.  Diet, exercise. ?

## 2022-01-23 NOTE — Assessment & Plan Note (Signed)
Old inferior MI in 1992.  Bypass June 2010.  Occluded vein graft to posterior lateral.  Continue with medical management goal-directed medical therapy.  No anginal symptoms. ?

## 2022-01-23 NOTE — Assessment & Plan Note (Signed)
Okay rate control.  Doing well.  Low-dose metoprolol.  No changes made.  No syncope. ?

## 2022-01-23 NOTE — Assessment & Plan Note (Signed)
Watch for any signs of bleeding on both Plavix as well as Eliquis.  Overall doing well.  Hemoglobin from outside lab 15.7 creatinine 1.7.  He reports no bleeding or bruising ?

## 2022-01-23 NOTE — Assessment & Plan Note (Signed)
Followed by  Dr. McCullough. 

## 2022-01-23 NOTE — Patient Instructions (Signed)
Medication Instructions:  The current medical regimen is effective;  continue present plan and medications.  *If you need a refill on your cardiac medications before your next appointment, please call your pharmacy*  Follow-Up: At [facility], you and your health needs are our priority.  As part of our continuing mission to provide you with exceptional heart care, we have created designated Provider Care Teams.  These Care Teams include your primary Cardiologist (physician) and Advanced Practice Providers (APPs -  Physician Assistants and Nurse Practitioners) who all work together to provide you with the care you need, when you need it.  We recommend signing up for the patient portal called "MyChart".  Sign up information is provided on this After Visit Summary.  MyChart is used to connect with patients for Virtual Visits (Telemedicine).  Patients are able to view lab/test results, encounter notes, upcoming appointments, etc.  Non-urgent messages can be sent to your provider as well.   To learn more about what you can do with MyChart, go to [email or web address].    Your next appointment:   1 year(s)  The format for your next appointment:   In Person  Provider:   Mark Skains, MD   Thank you for choosing [facility]!!    

## 2022-01-30 DIAGNOSIS — Z961 Presence of intraocular lens: Secondary | ICD-10-CM | POA: Diagnosis not present

## 2022-01-30 DIAGNOSIS — H524 Presbyopia: Secondary | ICD-10-CM | POA: Diagnosis not present

## 2022-01-30 DIAGNOSIS — H52203 Unspecified astigmatism, bilateral: Secondary | ICD-10-CM | POA: Diagnosis not present

## 2022-01-30 DIAGNOSIS — E119 Type 2 diabetes mellitus without complications: Secondary | ICD-10-CM | POA: Diagnosis not present

## 2022-03-01 ENCOUNTER — Other Ambulatory Visit: Payer: Medicare Other

## 2022-03-01 ENCOUNTER — Ambulatory Visit: Payer: Medicare Other

## 2022-03-04 ENCOUNTER — Other Ambulatory Visit: Payer: Self-pay | Admitting: Cardiology

## 2022-03-04 DIAGNOSIS — Z951 Presence of aortocoronary bypass graft: Secondary | ICD-10-CM

## 2022-03-04 DIAGNOSIS — I2510 Atherosclerotic heart disease of native coronary artery without angina pectoris: Secondary | ICD-10-CM

## 2022-03-04 DIAGNOSIS — E782 Mixed hyperlipidemia: Secondary | ICD-10-CM

## 2022-03-04 DIAGNOSIS — Z79899 Other long term (current) drug therapy: Secondary | ICD-10-CM

## 2022-03-08 ENCOUNTER — Ambulatory Visit: Payer: Medicare Other | Admitting: Surgical

## 2022-03-08 ENCOUNTER — Ambulatory Visit
Admission: RE | Admit: 2022-03-08 | Discharge: 2022-03-08 | Disposition: A | Discharge status: Home / Self Care | Payer: Medicare Other | Source: Ambulatory Visit | Attending: Surgery | Admitting: Surgery

## 2022-03-08 VITALS — BP 133/78 | HR 74 | Resp 20 | Ht 69.0 in | Wt 201.0 lb

## 2022-03-08 DIAGNOSIS — I7121 Aneurysm of the ascending aorta, without rupture: Secondary | ICD-10-CM

## 2022-03-08 NOTE — Patient Instructions (Signed)
Continue lifestyle and activity as you are doing. ?

## 2022-03-08 NOTE — Progress Notes (Signed)
? ? ? ?Subjective:  ? ?  ?Patient ID: Garrett Merfeld., male    DOB: May 01, 1941, 81 y.o.   MRN: [id number] ? ?Chief Complaint  ?Patient presents with  ? Thoracic Aortic Aneurysm  ?  CT chest today  ? ? ?HPI ?Patient is in today for ongoing surveillance of ascending thoracic aneurysm.  The scan from 1 year ago measured 4.3 x 4.3 cm and was associated with a known penetrating ulcer along the distal aortic arch.  Today's scan reveals it is now measuring 4.6 x 4.6 cm.  The penetrating ulcer is unchanged from previous exam and there are no other significant worrisome findings.  He reports that he is essentially asymptomatic in terms of cardiac symptoms but does admit to increasing fatigability lately.  He goes to the [facility] several times a week but his stamina in regards to his walking as decreased any feels more tired than normal.  He denies chest pain or back pain in the upper thorax but is planning to see his orthopedist in the near future due to lower back pain.  He does work in his yard frequently. ? ?Review of systems: Otherwise unremarkable ? ? ?   ?Objective:  ?  ?BP 133/78 (BP Location: Right Arm, Patient Position: Sitting)   Pulse 74   Resp 20   Ht '5\' 9"'$  (1.753 m)   Wt 201 lb (91.2 kg)   SpO2 95% Comment: RA  BMI 29.68 kg/m?  ?BP Readings from Last 3 Encounters:  ?03/08/22 133/78  ?01/23/22 120/60  ?09/29/21 132/80  ? ? ?Physical Exam ?Vitals reviewed.  ?Constitutional:   ?   General: He is not in acute distress. ?   Appearance: Normal appearance. He is normal weight. He is not ill-appearing.  ?HENT:  ?   Head: Normocephalic and atraumatic.  ?Cardiovascular:  ?   Rate and Rhythm: Normal rate and regular rhythm.  ?   Heart sounds: No murmur heard. ?  No gallop.  ?Pulmonary:  ?   Effort: Pulmonary effort is normal.  ?   Breath sounds: Normal breath sounds. No wheezing or rales.  ?Abdominal:  ?   General: Abdomen is flat.  ?   Palpations: Abdomen is soft.  ?Skin: ?   General: Skin is warm and dry.   ?Neurological:  ?   General: No focal deficit present.  ?   Mental Status: He is alert.  ?Psychiatric:     ?   Mood and Affect: Mood normal.     ?   Behavior: Behavior normal.  ? ? ?CT CHEST WO CONTRAST ? ?Result Date: 03/08/2022 ?CLINICAL DATA:  Aortic aneurysm, known or suspected EXAM: CT CHEST WITHOUT CONTRAST TECHNIQUE: Multidetector CT imaging of the chest was performed following the standard protocol without IV contrast. RADIATION DOSE REDUCTION: This exam was performed according to the departmental dose-optimization program which includes automated exposure control, adjustment of the mA and/or kV according to patient size and/or use of iterative reconstruction technique. COMPARISON:  CT 03/10/2021. FINDINGS: Cardiovascular: Normal cardiac size. Prior coronary artery bypass.The ascending aorta measures 4.6 x 4.6 cm, previously up to 4.3 x 4.3 cm, remeasured for consistency. (Series 10, image 171).Unchanged known penetrating ulcer along the distal aortic arch. No intramural hematoma.Moderate atherosclerosis of the thoracic aorta. Normal size main and branch pulmonary arteries. Mediastinum/Nodes: No lymphadenopathy. Esophagus is unremarkable.The trachea is unremarkable. Lungs/Pleura: No new airspace disease.Unchanged left greater than right bibasilar reticulation volume loss.No pleural effusion.No pneumothorax. No suspicious pulmonary nodules. Upper Abdomen: No  acute abnormality. Musculoskeletal: No acute osseous abnormality.No suspicious lytic or blastic lesions. IMPRESSION: Ascending aortic aneurysm measures up to 4.6 cm, previously 4.3 cm. Recommend semi-annual imaging followup by CTA or MRA and referral to cardiothoracic surgery if not already obtained. This recommendation follows 2010 ACCF/AHA/AATS/ACR/ASA/SCA/SCAI/SIR/STS/SVM Guidelines for the Diagnosis and Management of Patients With Thoracic Aortic Disease. Circulation. 2010; 121: B096-G836. Aortic aneurysm NOS (ICD10-I71.9) Electronically Signed   By:  Maurine Simmering M.D.   On: 03/08/2022 12:30    ? ?   ?Assessment & Plan:  ? ?Problem List Items Addressed This Visit   ? ? Ascending aortic aneurysm ([place]) - Primary  ? ? ?A/P: The patient's a sending aortic aneurysm has increased in diameter and will not require every 6 month CTA.  Future appointment will be made with MD as it is now greater than 4.5 cm.  He is instructed to continue healthy lifestyle activities and has overall done quite well from his CABG and previous aneurysm repair (abdominal stent) and lives a very functional lifestyle. ? ?No orders of the defined types were placed in this encounter. ? ? ?No follow-ups on file. ? ?John Giovanni, PA-C ? ? ?

## 2022-03-29 DIAGNOSIS — L821 Other seborrheic keratosis: Secondary | ICD-10-CM | POA: Diagnosis not present

## 2022-03-29 DIAGNOSIS — L570 Actinic keratosis: Secondary | ICD-10-CM | POA: Diagnosis not present

## 2022-03-29 DIAGNOSIS — D1801 Hemangioma of skin and subcutaneous tissue: Secondary | ICD-10-CM | POA: Diagnosis not present

## 2022-03-29 DIAGNOSIS — D0439 Carcinoma in situ of skin of other parts of face: Secondary | ICD-10-CM | POA: Diagnosis not present

## 2022-03-29 DIAGNOSIS — L814 Other melanin hyperpigmentation: Secondary | ICD-10-CM | POA: Diagnosis not present

## 2022-03-29 DIAGNOSIS — D2372 Other benign neoplasm of skin of left lower limb, including hip: Secondary | ICD-10-CM | POA: Diagnosis not present

## 2022-03-30 ENCOUNTER — Encounter: Payer: Self-pay | Admitting: Internal Medicine

## 2022-03-30 ENCOUNTER — Ambulatory Visit: Payer: Medicare Other | Admitting: Internal Medicine

## 2022-03-30 VITALS — BP 124/76 | HR 63 | Ht 69.0 in | Wt 203.4 lb

## 2022-03-30 DIAGNOSIS — R739 Hyperglycemia, unspecified: Secondary | ICD-10-CM

## 2022-03-30 DIAGNOSIS — N183 Chronic kidney disease, stage 3 unspecified: Secondary | ICD-10-CM

## 2022-03-30 DIAGNOSIS — E1122 Type 2 diabetes mellitus with diabetic chronic kidney disease: Secondary | ICD-10-CM | POA: Diagnosis not present

## 2022-03-30 LAB — POCT GLYCOSYLATED HEMOGLOBIN (HGB A1C): Hemoglobin A1C: 7 % — AB (ref 4.0–5.6)

## 2022-03-30 MED ORDER — GLIPIZIDE 5 MG PO TABS: ORAL_TABLET | ORAL | 3 refills | Status: DC

## 2022-03-30 MED ORDER — FENOFIBRATE 200 MG PO CAPS: 200.0000 mg | ORAL_CAPSULE | Freq: Every day | ORAL | 3 refills | Status: DC

## 2022-03-30 NOTE — Progress Notes (Signed)
Name: Garrett Jordan  Age/ Sex: 81 y.o., male   MRN/ DOB: [id number], 1941/02/27     PCP: Antony Contras, MD   Reason for Endocrinology Evaluation: Type 2 Diabetes Mellitus     Initial Endocrinology Clinic Visit:  03/17/2020    PATIENT IDENTIFIER: Garrett Jordan. is a 81 y.o. male with a past medical history of T2DM, RLS, CAD and CKD. The patient has followed with Endocrinology clinic since 03/17/2020 for consultative assistance with management of his diabetes.  DIABETIC HISTORY:  Garrett Jordan was diagnosed with T2 DM in 2019. Invokana- cost prohibitive . His hemoglobin A1c has ranged from 6.7% in 2020, peaking at 7.8 % in 2021    On his initial visit to our clinic his A1c was 6.5 % , he was on Januvia which was continued, Invokana was cost prohibitive and we switched it to Glipizide.   Januvia stopped 06/2020 due to high cost for pt  Jardiance started by nephrology by 07/2020    HYPERCALCEMIA HISTORY: Garrett Jordan indicates that he was first noted with hypercalcemia in 2020. He has hx of kidney stones  He is S/P Left parathyroidectomy in 1970's  Ca/Cr ratio 0.0210  SUBJECTIVE:    During the last visit (09/29/2021): A1c 6.1% We continued Jardiance and increased Glipizide    Today (03/30/2022): Garrett Jordan is here for a follow up on diabetes management.  He checks his blood sugars 1-2 times daily . The patient has not had hypoglycemic episodes since the last clinic visit.    Follows with Dr. Marlou Porch , he was started on Fenofibrate by Dr. Marlou Porch and  Chauncey Cruel  was added 07/2021  He tells me he was advised to stop farxiga by a provider, I am unable to find a note in his chart about this, pt denies side effects to it but he does admit that it was cost prohibitive     HOME DIABETES REGIMEN:  Glipizide 5 mg, half a tablet before BID Farxiga  10 mg daily - not taking  Rosuvastatin 20 mg , Half a tablet Monday and Thursday  Vascepa 1 g, 2 caps twice daily  Fenofibrate 48 mg  daily  Zetia 10 mg daily    Statin: yes ACE-I/ARB: yes     METER DOWNLOAD SUMMARY: 3/26-5/20/2023 Average 157 mg/dL  SD 31  DIABETIC COMPLICATIONS: Microvascular complications:  CKD III Denies: retinopathy, neuropathy  Last Eye Exam: Completed 01/2021  Macrovascular complications:  CAD Denies: CVA, PVD       PHYSICAL EXAM: VS: BP 124/76 (BP Location: Left Arm, Patient Position: Sitting, Cuff Size: Large)   Pulse 63   Ht '5\' 9"'$  (1.753 m)   Wt 203 lb 6.4 oz (92.3 kg)   SpO2 96%   BMI 30.04 kg/m    EXAM: General: Pt appears well and is in NAD  Lungs: Clear with good BS bilat with no rales, rhonchi, or wheezes  Heart: Auscultation: RRR  Abdomen: Normoactive bowel sounds, soft, nontender, without masses or organomegaly palpable  Extremities:  BL UX:LKGMW edema on the left with scab formation due to an injury but no evidence of infection  Mental Status: Judgment, insight: intact Orientation: oriented to time, place, and person Mood and affect: no depression, anxiety, or agitation    DM Foot Exam 03/31/2021  The skin of the feet is intact without sores or ulcerations. The pedal pulses are 1+ on right and 1+ on left. The sensation is intact to a screening 5.07, 10  gram monofilament bilaterally    HISTORY:  Past Medical History:  Past Medical History:  Diagnosis Date   AAA (abdominal aortic aneurysm) (HCC)    Arthritis    knees   Atrial fibrillation (HCC)    CAD (coronary artery disease)    Cancer (HCC)    prostate   Diabetes mellitus without complication (HCC)    GERD (gastroesophageal reflux disease)    Gout    Hyperlipidemia    Hypertension    Hypothyroidism    Kidney stone    right kidney   Myocardial infarction ([place]) 01/1991   sp inferior   Nerve compression    right leg   Reflux    Seasonal allergies    Thyroid disease    hypothyroidism   Past Surgical History:  Past Surgical History:  Procedure Laterality Date   ABDOMINAL AORTAGRAM N/A  02/28/2012   Procedure: ABDOMINAL Maxcine Ham;  Surgeon: Serafina Mitchell, MD;  Location: [facility] CATH LAB;  Service: Cardiovascular;  Laterality: N/A;   abdominal aortagram embolization  02/28/2012   ABDOMINAL AORTIC ANEURYSM REPAIR  Sept. 2013   CORONARY ARTERY BYPASS GRAFT  04/2009   CORONARY STENT PLACEMENT  1992 and  2009   RCA   EMBOLIZATION Right 02/28/2012   Procedure: EMBOLIZATION;  Surgeon: Serafina Mitchell, MD;  Location: [facility] CATH LAB;  Service: Cardiovascular;  Laterality: Right;   IR GENERIC HISTORICAL  12/05/2016   IR RADIOLOGIST EVAL & MGMT 12/05/2016 Jacqulynn Cadet, MD [facility] INTERV RAD   IR RADIOLOGIST EVAL & MGMT  07/10/2019   IR RADIOLOGIST EVAL & MGMT  08/25/2020   IR RADIOLOGIST EVAL & MGMT  08/16/2021   KIDNEY STONE SURGERY     LEFT HEART CATHETERIZATION WITH CORONARY/GRAFT ANGIOGRAM N/A 01/08/2015   Procedure: LEFT HEART CATHETERIZATION WITH Beatrix Fetters;  Surgeon: Jacolyn Reedy, MD;  Location: [facility] CATH LAB;  Service: Cardiovascular;  Laterality: N/A;   parathyroid adenoma     PROSTATE SURGERY     rad seeds   SPINE SURGERY     TONSILLECTOMY     Social History:  reports that he quit smoking about 51 years ago. His smoking use included cigarettes. He has never used smokeless tobacco. He reports current alcohol use of about 6.0 - 9.0 standard drinks per week. He reports that he does not use drugs. Family History:  Family History  Problem Relation Age of Onset   Heart disease Father        Heart Disease before age 58   Heart attack Father    Hyperlipidemia Father    Hypertension Father    Stroke Mother    Deep vein thrombosis Mother    Heart disease Sister    Diabetes Sister    Hyperlipidemia Sister    Hypertension Sister    Heart attack Sister    Heart disease Paternal Uncle    Diabetes Maternal Grandmother    Diabetes Sister      HOME MEDICATIONS: Allergies as of 03/30/2022       Reactions   Ace Inhibitors Cough   Crestor [rosuvastatin] Other (See  Comments)   Muscle aches   Nitroglycerin Other (See Comments)   Very pronounced lowered BP with IV nitro for caths   Quinolones    Aortic root enlargement        Medication List        Accurate as of Mar 30, 2022  7:53 AM. If you have any questions, ask your nurse or doctor.  STOP taking these medications    fenofibrate 48 MG tablet Commonly known as: TRICOR Stopped by: Dorita Sciara, MD       TAKE these medications    Accu-Chek Guide test strip Generic drug: glucose blood USE TWICE DAILY   acetaminophen 650 MG CR tablet Commonly known as: TYLENOL 650 mg every 8 (eight) hours as needed for pain.   allopurinol 300 MG tablet Commonly known as: ZYLOPRIM Take 300 mg by mouth every other day. Take in the evening - on even days of the month   amLODipine 10 MG tablet Commonly known as: NORVASC TAKE 1 TABLET BY MOUTH  DAILY   clonazePAM 1 MG tablet Commonly known as: KLONOPIN Take 0.5 mg by mouth at bedtime.   clopidogrel 75 MG tablet Commonly known as: PLAVIX TAKE 1 TABLET BY MOUTH  DAILY   Coenzyme Q10 300 MG Caps Take by mouth.   Eliquis 5 MG Tabs tablet Generic drug: apixaban TAKE 1 TABLET BY MOUTH TWICE A DAY   ezetimibe 10 MG tablet Commonly known as: ZETIA Take 1 tablet (10 mg total) by mouth daily.   Farxiga 10 MG Tabs tablet Generic drug: dapagliflozin propanediol Take 10 mg by mouth daily.   fenofibrate micronized 200 MG capsule Commonly known as: LOFIBRA Take 1 capsule (200 mg total) by mouth daily before breakfast. Started by: Dorita Sciara, MD   fluorouracil 5 % cream Commonly known as: EFUDEX Use as directed   fluticasone 50 MCG/ACT nasal spray Commonly known as: FLONASE Place 2 sprays into both nostrils daily as needed for allergies.   glipiZIDE 5 MG tablet Commonly known as: GLUCOTROL Take 0.5 tablets (2.5 mg total) by mouth daily before breakfast AND 1 tablet (5 mg total) daily before supper. What  changed: See the new instructions. Changed by: Dorita Sciara, MD   levothyroxine 175 MCG tablet Commonly known as: SYNTHROID Take 175 mcg by mouth daily before breakfast.   loratadine-pseudoephedrine 10-240 MG 24 hr tablet Commonly known as: CLARITIN-D 24-hour Take 1 tablet by mouth at bedtime.   metoprolol tartrate 25 MG tablet Commonly known as: LOPRESSOR TAKE 1 TABLET BY MOUTH  TWICE DAILY   olmesartan-hydrochlorothiazide 40-25 MG tablet Commonly known as: BENICAR HCT TAKE 1 TABLET BY MOUTH  DAILY   pantoprazole 40 MG tablet Commonly known as: PROTONIX TAKE 1 TABLET BY MOUTH  DAILY   PreserVision AREDS 2 Caps Take 1 capsule by mouth 2 (two) times daily.   rosuvastatin 10 MG tablet Commonly known as: CRESTOR Take 10 mg by mouth as directed. Per patient, takes Mondays & Thursdays   tamsulosin 0.4 MG Caps capsule Commonly known as: FLOMAX Take 0.4 mg by mouth every other day. Take in the evening - on even days of the month   triamcinolone cream 0.1 % Commonly known as: KENALOG Apply 1 application topically as needed.   TURMERIC PO Take by mouth. Patient chew 2 gummies by mouth two times daily   Vascepa 1 g capsule Generic drug: icosapent Ethyl Take 2 capsules (2 g total) by mouth 2 (two) times daily.   vitamin B-12 1000 MCG tablet Commonly known as: CYANOCOBALAMIN Take 2,500 mcg by mouth every other day.   Vitamin D3 50 MCG (2000 UT) Tabs Take 2,000 Units by mouth daily.        DM foot exam: 03/30/2022   The skin of the feet is intact without sores or ulcerations. The pedal pulses are 2+ on right and 2+ on left. The sensation  is intact to a screening 5.07, 10 gram monofilament bilaterally    DATA REVIEWED:  Lab Results  Component Value Date   HGBA1C 7.0 (A) 03/30/2022   HGBA1C 6.9 (A) 09/29/2021   HGBA1C 6.1 (A) 03/31/2021    01/16/2022 BUN/Cr 23/1.70 GFR 40 Tg 361 HDL 37 LDL 57 TSH 3.45   ASSESSMENT / PLAN / RECOMMENDATIONS:   1)  Type 2 Diabetes Mellitus, Optimally controlled, With CKD III complications - Most recent A1c of 7.0%. Goal A1c < 7.0%.   - His A1c remains acceptable at 7.0% -He did not qualify for patient assistance program for Farxiga - In review of his meter download his BG's have been high up to 300 mg/dL  - Will increase Glipizide as below, while he is off [place], he understands once farxiga started he will probably need less Glipizide.    MEDICATIONS:  Increase  glipizide 5 mg, half a tablet before Breakfast and 1 tablet before supper  Continue Farxiga 10 mg daily     EDUCATION / INSTRUCTIONS: BG monitoring instructions: Patient is instructed to check his blood sugars 2 times a day, before breakfast and supper . Call [facility] if: BG persistently < 70  I reviewed the Rule of 15 for the treatment of hypoglycemia in detail with the patient. Literature supplied.     2) Diabetic complications:  Eye: Does not have known diabetic retinopathy.  Neuro/ Feet: Does not have known diabetic peripheral neuropathy .  Renal: Patient does  have known baseline CKD. He   is on an ACEI/ARB at present.     3) Hypertriglyceridemia: Patient is on rosuvastatin twice a week due to myalgia. He is on  Vascepa started in 2021.  Tg in 12/2019 was 946 mg/dl , repeat Tg decreased to 278 mg/dL by 07/2021 - Zetia started 07/2021 by Cardiology  -TG continues to be elevated, will increase fenofibrate to 200 mg  Medications  Continue rosuvastatin 20 mg , Half a tablet Monday and Thursday  Continue Vascepa 1 g, 2 caps twice daily  Increase fenofibrate 200 mg daily  Continue Zetia 10 mg daily     F/U in 6 months    Signed electronically by: Mack Guise, MD  [facility]  [facility] [address] [place], [place] [address] Phone: [phone] FAX: [phone]   CC: Antony Contras, MD [place] [address] Phone:  [phone]  Fax: [phone]  Return to Endocrinology clinic as below: No future appointments.

## 2022-03-30 NOTE — Patient Instructions (Addendum)
-   Increase  Glipizide 5 mg , to HALF a tablet before Breakfast and 1 tablet before Supper  -Continue Farxiga 10 mg daily   Increase fenofibrate 200 mg daily      HOW TO TREAT LOW BLOOD SUGARS (Blood sugar LESS THAN 70 MG/DL) Please follow the RULE OF 15 for the treatment of hypoglycemia treatment (when your (blood sugars are less than 70 mg/dL)   STEP 1: Take 15 grams of carbohydrates when your blood sugar is low, which includes:  3-4 GLUCOSE TABS  OR 3-4 OZ OF JUICE OR REGULAR SODA OR ONE TUBE OF GLUCOSE GEL    STEP 2: RECHECK blood sugar in 15 MINUTES STEP 3: If your blood sugar is still low at the 15 minute recheck --> then, go back to STEP 1 and treat AGAIN with another 15 grams of carbohydrates.

## 2022-04-05 DIAGNOSIS — M961 Postlaminectomy syndrome, not elsewhere classified: Secondary | ICD-10-CM | POA: Diagnosis not present

## 2022-04-05 DIAGNOSIS — M5416 Radiculopathy, lumbar region: Secondary | ICD-10-CM | POA: Diagnosis not present

## 2022-04-05 DIAGNOSIS — M5136 Other intervertebral disc degeneration, lumbar region: Secondary | ICD-10-CM | POA: Diagnosis not present

## 2022-04-05 DIAGNOSIS — M5459 Other low back pain: Secondary | ICD-10-CM | POA: Diagnosis not present

## 2022-04-13 ENCOUNTER — Other Ambulatory Visit: Payer: Self-pay | Admitting: Cardiology

## 2022-04-14 DIAGNOSIS — M5459 Other low back pain: Secondary | ICD-10-CM | POA: Diagnosis not present

## 2022-04-26 ENCOUNTER — Other Ambulatory Visit: Payer: Self-pay | Admitting: Urology

## 2022-05-03 DIAGNOSIS — M961 Postlaminectomy syndrome, not elsewhere classified: Secondary | ICD-10-CM | POA: Diagnosis not present

## 2022-05-03 DIAGNOSIS — M48062 Spinal stenosis, lumbar region with neurogenic claudication: Secondary | ICD-10-CM | POA: Diagnosis not present

## 2022-05-03 DIAGNOSIS — M5136 Other intervertebral disc degeneration, lumbar region: Secondary | ICD-10-CM | POA: Diagnosis not present

## 2022-05-04 ENCOUNTER — Other Ambulatory Visit: Payer: Self-pay | Admitting: Cardiology

## 2022-05-11 DIAGNOSIS — M4316 Spondylolisthesis, lumbar region: Secondary | ICD-10-CM | POA: Diagnosis not present

## 2022-05-11 DIAGNOSIS — M48062 Spinal stenosis, lumbar region with neurogenic claudication: Secondary | ICD-10-CM | POA: Diagnosis not present

## 2022-05-12 ENCOUNTER — Encounter: Payer: Self-pay | Admitting: Cardiology

## 2022-05-14 NOTE — Progress Notes (Signed)
Cardiology Office Note:    Date:  05/15/2022   ID:  Garrett Meek Sr., DOB 08-Mar-1941, MRN [id number]  PCP:  Antony Contras, MD   [facility] Providers Cardiologist:  Lenna Sciara, MD Referring MD: Antony Contras, MD   Chief Complaint/Reason for Referral: Preoperative cardiovascular risk assessment  ASSESSMENT:    1. Preoperative cardiovascular examination   2. Chest pain, unspecified type   3. S/P CABG (coronary artery bypass graft)   4. Type 2 diabetes mellitus with complication, without long-term current use of insulin ([place])   5. Hypertension associated with diabetes ([place])   6. Hyperlipidemia associated with type 2 diabetes mellitus ([place])   7. Penetrating ulcer of aorta ([place])   8. Abdominal aortic aneurysm (AAA) without rupture, unspecified part ([place])   9. Paroxysmal atrial fibrillation ([place])   10. Stage 3 chronic kidney disease, unspecified whether stage 3a or 3b CKD ([place])   11. Spinal stenosis, unspecified spinal region     PLAN:    In order of problems listed above: 1.  Preoperative cardiovascular assessment: We will obtain echocardiogram and Lexiscan stress test due to inability to ambulate to high degree due to spinal stenosis.  Only if he has high risk findings would further cardiac evaluation be necessary.  We will have the patient follow-up in 3 months or earlier if needed with Dr. Marlou Porch. 2.  Chest pain: The fact that it improves with Nexium would suggest GERD bandut we will obtain a stress test to evaluate further. 3.  History of CABG: Continue current medical therapy with statin, aspirin, and blood pressure control. 4.  Type 2 diabetes: Continue statin, Farxiga, Eliquis in lieu of aspirin, can consider ARB for ACE inhibitor for renal protection and diabetic. 5.  Hypertension: His BP is fairly well controlled. 6.  Hyperlipidemia: Goal LDL of less than 70 continue Crestor. 7.  Penetrating aortic ulcer: This is being followed by cardiothoracic surgery; I do not  think his symptoms stem from this. 8.  Abdominal aortic aneurysm: Followed by interventional radiology; I do not think his symptoms are related to this. 9.  Paroxysmal atrial fibrillation: Patient is currently on Eliquis.  We will stop aspirin given need for chronic anticoagulation. 10.  Stage III chronic kidney disease: Consider ACE or ARB for renal protection and diabetic. 11.  Spinal stenosis: The patient was seen by Sherley Bounds, a local neurosurgeon.  We will communicate with his office after testing has been performed.  We will have recommendations after these tests have been reviewed these recommendations will also include those pertaining to antiplatelet and anticoagulation perioperative management         Shared Decision Making/Informed Consent The risks [chest pain, shortness of breath, cardiac arrhythmias, dizziness, blood pressure fluctuations, myocardial infarction, stroke/transient ischemic attack, nausea, vomiting, allergic reaction, radiation exposure, metallic taste sensation and life-threatening complications (estimated to be 1 in 10,000)], benefits (risk stratification, diagnosing coronary artery disease, treatment guidance) and alternatives of a nuclear stress test were discussed in detail with Garrett Jordan and he agrees to proceed.   Dispo:  Return in about 3 months (around 08/15/2022).      Medication Adjustments/Labs and Tests Ordered: Current medicines are reviewed at length with the patient today.  Concerns regarding medicines are outlined above.  The following changes have been made:  no change   Labs/tests ordered: Orders Placed This Encounter  Procedures   MYOCARDIAL PERFUSION IMAGING   EKG 12-Lead   ECHOCARDIOGRAM COMPLETE    Medication Changes: No orders of the  defined types were placed in this encounter.    Current medicines are reviewed at length with the patient today.  The patient does not have concerns regarding medicines.   History of Present  Illness:    FOCUSED PROBLEM LIST:   1.  Coronary artery disease status post CABG consisting of a LIMA to LAD, vein graft to obtuse marginal 2, vein graft to PLV, vein graft to PDA, and vein graft to right ventricular branch; last cardiac catheterization 2016 demonstrated high-grade native vessel disease with the vein graft to PDA being occluded 2.  Hypertension 3.  Hyperlipidemia 4.  AAA status post endovascular aortic repair with bifurcated stent graft 2013 with persistent type II endoleak followed by interventional radiology 5.  Type 2 diabetes not on insulin 6.  Thoracic aortic aneurysm with penetrating aortic ulcer followed by CT surgery 7.  Atrial fibrillation on Eliquis 8.  Right bundle branch block 9.  Stage III chronic kidney disease 10.  Spinal stenosis  The patient is a 81 y.o. male with the indicated medical history here for expedited visit due to chest pain.  I am seeing the patient as the doctor of the day as the patient is normally cared for by Dr. Marlou Porch.  The patient was last seen by Dr. Marlou Porch 4 months ago.  At that visit he was doing well without reports of angina.    The patient saw Dr. Sherley Bounds of neurosurgery here.  He reported chest discomfort which we used he was not sure was related to GERD or angina.  For this reason he was referred for preoperative cardiovascular risk assessment.  In terms of his chest discomfort syndrome he tells me that typically occurs at rest in the evening.  He was recently changed from Nexium to Protonix.  He tells me that once he gets these feelings and the Protonix is not helping he will take a Nexium that seems to help relieve his pain.  He does not necessarily get angina when he walks however he is quite limited from his spinal stenosis.  He is unable to tell me whether the chest pain is postprandial in nature or not.  He denies any presyncope, syncope, severe shortness of breath he has more fatigue as he is more deconditioned, and he has had no  severe bleeding or bruising.  He has not required hospitalization or emergency room visits recently.          Current Medications: Current Meds  Medication Sig   acetaminophen (TYLENOL) 650 MG CR tablet 650 mg every 8 (eight) hours as needed for pain.   allopurinol (ZYLOPRIM) 300 MG tablet Take 300 mg by mouth every other day. Take in the evening - on even days of the month   amLODipine (NORVASC) 10 MG tablet TAKE 1 TABLET BY MOUTH  DAILY   Cholecalciferol (VITAMIN D3) 2000 UNITS TABS Take 2,000 Units by mouth daily.    clonazePAM (KLONOPIN) 1 MG tablet Take 0.5 mg by mouth at bedtime.   clopidogrel (PLAVIX) 75 MG tablet TAKE 1 TABLET BY MOUTH  DAILY   Coenzyme Q10 300 MG CAPS Take by mouth.   ELIQUIS 5 MG TABS tablet TAKE 1 TABLET BY MOUTH TWICE A DAY   ezetimibe (ZETIA) 10 MG tablet Take 1 tablet (10 mg total) by mouth daily.   FARXIGA 10 MG TABS tablet Take 10 mg by mouth daily.   fenofibrate micronized (LOFIBRA) 200 MG capsule Take 1 capsule (200 mg total) by mouth daily before breakfast.  fluorouracil (EFUDEX) 5 % cream Use as directed   fluticasone (FLONASE) 50 MCG/ACT nasal spray Place 2 sprays into both nostrils daily as needed for allergies.   glipiZIDE (GLUCOTROL) 5 MG tablet Take 0.5 tablets (2.5 mg total) by mouth daily before breakfast AND 1 tablet (5 mg total) daily before supper.   glucose blood (ACCU-CHEK GUIDE) test strip USE TWICE DAILY   levothyroxine (SYNTHROID, LEVOTHROID) 175 MCG tablet Take 175 mcg by mouth daily before breakfast.   loratadine-pseudoephedrine (CLARITIN-D 24-HOUR) 10-240 MG per 24 hr tablet Take 1 tablet by mouth at bedtime.   metoprolol tartrate (LOPRESSOR) 25 MG tablet TAKE 1 TABLET BY MOUTH  TWICE DAILY   Multiple Vitamins-Minerals (PRESERVISION AREDS 2) CAPS Take 1 capsule by mouth 2 (two) times daily.   olmesartan-hydrochlorothiazide (BENICAR HCT) 40-25 MG tablet TAKE 1 TABLET BY MOUTH  DAILY   pantoprazole (PROTONIX) 40 MG tablet TAKE 1  TABLET BY MOUTH  DAILY   rosuvastatin (CRESTOR) 10 MG tablet Take 10 mg by mouth as directed. Per patient, takes Mondays & Thursdays   Tamsulosin HCl (FLOMAX) 0.4 MG CAPS Take 0.4 mg by mouth every other day. Take in the evening - on even days of the month   triamcinolone cream (KENALOG) 0.1 % Apply 1 application topically as needed.   TURMERIC PO Take by mouth. Patient chew 2 gummies by mouth two times daily   VASCEPA 1 g capsule TAKE 2 CAPSULES BY MOUTH 2 TIMES DAILY.   vitamin B-12 (CYANOCOBALAMIN) 1000 MCG tablet Take 2,500 mcg by mouth every other day.      Allergies:    Ace inhibitors, Crestor [rosuvastatin], Nitroglycerin, and Quinolones   Social History:   Social History   Tobacco Use   Smoking status: Former    Types: Cigarettes    Quit date: 08/01/1970    Years since quitting: 51.8   Smokeless tobacco: Never  Vaping Use   Vaping Use: Never used  Substance Use Topics   Alcohol use: Yes    Alcohol/week: 6.0 - 9.0 standard drinks of alcohol    Types: 4 - 5 Glasses of wine, 2 - 4 Cans of beer per week    Comment: 1-3 glasses 3-4 times a week   Drug use: No     Family Hx: Family History  Problem Relation Age of Onset   Heart disease Father        Heart Disease before age 11   Heart attack Father    Hyperlipidemia Father    Hypertension Father    Stroke Mother    Deep vein thrombosis Mother    Heart disease Sister    Diabetes Sister    Hyperlipidemia Sister    Hypertension Sister    Heart attack Sister    Heart disease Paternal Uncle    Diabetes Maternal Grandmother    Diabetes Sister      Review of Systems:   Please see the history of present illness.    All other systems reviewed and are negative.     EKGs/Labs/Other Test Reviewed:    EKG:  EKG performed 2022 that I personally reviewed demonstrates atrial fibrillation with right bundle branch block; EKG performed today that I personally reviewed demonstrates rate controlled atrial fibrillation with a  right bundle branch block and occasional PVCs.  Prior CV studies:  Coronary angiography 2016 demonstrates patent LIMA to LAD, vein graft to obtuse marginal 2, vein graft to RV marginal, and vein graft to LPL branch; occluded vein graft to PDA.  There is severe native vessel disease as well as moderate to severe disease of the distal portion of the RV marginal graft  Renal Doppler ultrasound 2022 demonstrates no renal artery stenosis  Lexiscan 2021 demonstrates fixed inferior defect without significant ischemia  Monitor 2021 demonstrates brief sinus pauses, occasional PVC, and rare atrial tachycardia  TTE 2019 demonstrates ejection fraction of 50 to 55% with mild to moderate left ventricular hypertrophy and no significant valve abnormalities.  Other studies Reviewed: Review of the additional studies/records demonstrates:     Recent Labs: 08/12/2021: Creatinine, Ser 1.50   Recent Lipid Panel Lab Results  Component Value Date/Time   CHOL 137 07/15/2021 09:12 AM   TRIG 278 (H) 07/15/2021 09:12 AM   HDL 29 (L) 07/15/2021 09:12 AM   LDLCALC 64 07/15/2021 09:12 AM   LDLDIRECT 67 07/15/2021 09:12 AM    Risk Assessment/Calculations:     CHA2DS2-VASc Score = 5   This indicates a 7.2% annual risk of stroke. The patient's score is based upon: CHF History: 0 HTN History: 1 Diabetes History: 1 Stroke History: 0 Vascular Disease History: 1 Age Score: 2 Gender Score: 0         Physical Exam:    VS:  BP 138/84 (BP Location: Left Arm, Patient Position: Sitting, Cuff Size: Normal)   Pulse 78   Ht '5\' 9"'$  (1.753 m)   Wt 199 lb (90.3 kg)   BMI 29.39 kg/m    Wt Readings from Last 3 Encounters:  05/15/22 199 lb (90.3 kg)  03/30/22 203 lb 6.4 oz (92.3 kg)  03/08/22 201 lb (91.2 kg)    GENERAL:  No apparent distress, AOx3 HEENT:  No carotid bruits, +2 carotid impulses, no scleral icterus CAR:  Irregular RR no murmurs, gallops, rubs, or thrills RES:  Clear to auscultation  bilaterally ABD:  Soft, nontender, nondistended, positive bowel sounds x 4 VASC:  +2 radial pulses, +2 carotid pulses, palpable pedal pulses NEURO:  CN 2-12 grossly intact; motor and sensory grossly intact PSYCH:  No active depression or anxiety EXT:  No edema, ecchymosis, or cyanosis  Signed, Early Osmond, MD  05/15/2022 [address], [place], [place]  [address] Phone: [phone]; Fax: [phone]   Note:  This document was prepared using Dragon voice recognition software and may include unintentional dictation errors.

## 2022-05-15 ENCOUNTER — Encounter: Payer: Self-pay | Admitting: Internal Medicine

## 2022-05-15 ENCOUNTER — Ambulatory Visit: Payer: Medicare Other | Admitting: Internal Medicine

## 2022-05-15 VITALS — BP 138/84 | HR 78 | Ht 69.0 in | Wt 199.0 lb

## 2022-05-15 DIAGNOSIS — M4800 Spinal stenosis, site unspecified: Secondary | ICD-10-CM | POA: Diagnosis not present

## 2022-05-15 DIAGNOSIS — I480 Paroxysmal atrial fibrillation: Secondary | ICD-10-CM | POA: Diagnosis not present

## 2022-05-15 DIAGNOSIS — E118 Type 2 diabetes mellitus with unspecified complications: Secondary | ICD-10-CM | POA: Diagnosis not present

## 2022-05-15 DIAGNOSIS — I714 Abdominal aortic aneurysm, without rupture, unspecified: Secondary | ICD-10-CM | POA: Diagnosis not present

## 2022-05-15 DIAGNOSIS — E1169 Type 2 diabetes mellitus with other specified complication: Secondary | ICD-10-CM

## 2022-05-15 DIAGNOSIS — N183 Chronic kidney disease, stage 3 unspecified: Secondary | ICD-10-CM

## 2022-05-15 DIAGNOSIS — E785 Hyperlipidemia, unspecified: Secondary | ICD-10-CM

## 2022-05-15 DIAGNOSIS — E1159 Type 2 diabetes mellitus with other circulatory complications: Secondary | ICD-10-CM | POA: Diagnosis not present

## 2022-05-15 DIAGNOSIS — Z01810 Encounter for preprocedural cardiovascular examination: Secondary | ICD-10-CM | POA: Diagnosis not present

## 2022-05-15 DIAGNOSIS — I152 Hypertension secondary to endocrine disorders: Secondary | ICD-10-CM

## 2022-05-15 DIAGNOSIS — Z951 Presence of aortocoronary bypass graft: Secondary | ICD-10-CM

## 2022-05-15 DIAGNOSIS — R079 Chest pain, unspecified: Secondary | ICD-10-CM | POA: Diagnosis not present

## 2022-05-15 DIAGNOSIS — I719 Aortic aneurysm of unspecified site, without rupture: Secondary | ICD-10-CM | POA: Diagnosis not present

## 2022-05-15 NOTE — Patient Instructions (Signed)
Medication Instructions:  Your physician recommends that you continue on your current medications as directed. Please refer to the Current Medication list given to you today.  *If you need a refill on your cardiac medications before your next appointment, please call your pharmacy*  Testing/Procedures: Echo Your physician has requested that you have an echocardiogram. Echocardiography is a painless test that uses sound waves to create images of your heart. It provides your doctor with information about the size and shape of your heart and how well your heart's chambers and valves are working. This procedure takes approximately one hour. There are no restrictions for this procedure.   Stress test Your physician has requested that you have a lexiscan myoview. For further information please visit [email or web address]. Please follow instruction sheet, as given.    Follow-Up: At [facility], you and your health needs are our priority.  As part of our continuing mission to provide you with exceptional heart care, we have created designated Provider Care Teams.  These Care Teams include your primary Cardiologist (physician) and Advanced Practice Providers (APPs -  Physician Assistants and Nurse Practitioners) who all work together to provide you with the care you need, when you need it.  Your next appointment:   3 month(s)  The format for your next appointment:   In Person  Provider:   Candee Furbish, MD     Important Information About Sugar

## 2022-05-17 ENCOUNTER — Telehealth: Payer: Self-pay

## 2022-05-17 NOTE — Telephone Encounter (Signed)
   Pre-operative Risk Assessment    Patient Name: Garrett DUNSHEE Sr.  DOB: [birth year] MRN: [id number]      Request for Surgical Clearance    Procedure:   L2-3 Laminectomy with possible interspinour plating  Date of Surgery:  Clearance TBD                                 Surgeon:  Eustace Moore, MD Surgeon's Group or Practice Name:  [facility] Phone number:  [phone] Fax number:  [phone]   Type of Clearance Requested:   - Medical  - Pharmacy:  Hold Clopidogrel (Plavix) and Apixaban (Eliquis)     Type of Anesthesia:  General    Additional requests/questions:   none  Signed, Gwendloyn Forsee   05/17/2022, 10:16 AM

## 2022-05-24 ENCOUNTER — Telehealth (HOSPITAL_COMMUNITY): Payer: Self-pay | Admitting: *Deleted

## 2022-05-24 NOTE — Telephone Encounter (Signed)
Patient given detailed instructions per Myocardial Perfusion Study Information Sheet for the test on  05/29/22. Patient notified to arrive 15 minutes early and that it is imperative to arrive on time for appointment to keep from having the test rescheduled.  If you need to cancel or reschedule your appointment, please call the office within 24 hours of your appointment. . Patient verbalized understanding. Anita Mcadory Jacqueline   

## 2022-05-28 ENCOUNTER — Other Ambulatory Visit: Payer: Self-pay | Admitting: Internal Medicine

## 2022-05-31 ENCOUNTER — Ambulatory Visit (HOSPITAL_COMMUNITY): Payer: Medicare Other | Attending: Internal Medicine

## 2022-05-31 ENCOUNTER — Ambulatory Visit (HOSPITAL_BASED_OUTPATIENT_CLINIC_OR_DEPARTMENT_OTHER): Payer: Medicare Other

## 2022-05-31 DIAGNOSIS — Z01810 Encounter for preprocedural cardiovascular examination: Secondary | ICD-10-CM | POA: Diagnosis not present

## 2022-05-31 LAB — MYOCARDIAL PERFUSION IMAGING
LV dias vol: 108 mL (ref 62–150)
LV sys vol: 42 mL
Nuc Stress EF: 61 %
Peak HR: 83 {beats}/min
Rest HR: 65 {beats}/min
Rest Nuclear Isotope Dose: 10.6 mCi
SDS: 3
SRS: 0
SSS: 3
ST Depression (mm): 0 mm
Stress Nuclear Isotope Dose: 32.8 mCi
TID: 1.05

## 2022-05-31 LAB — ECHOCARDIOGRAM COMPLETE
Area-P 1/2: 3.97 cm2
Height: 69 in
S' Lateral: 3.7 cm
Weight: 3184 oz

## 2022-05-31 MED ORDER — TECHNETIUM TC 99M TETROFOSMIN IV KIT
10.6000 | PACK | Freq: Once | INTRAVENOUS | Status: AC | PRN
  Administered 2022-05-31: 10.6 via INTRAVENOUS

## 2022-05-31 MED ORDER — TECHNETIUM TC 99M TETROFOSMIN IV KIT
32.8000 | PACK | Freq: Once | INTRAVENOUS | Status: AC | PRN
  Administered 2022-05-31: 32.8 via INTRAVENOUS

## 2022-05-31 MED ORDER — REGADENOSON 0.4 MG/5ML IV SOLN
0.4000 mg | Freq: Once | INTRAVENOUS | Status: AC
  Administered 2022-05-31: 0.4 mg via INTRAVENOUS

## 2022-06-01 ENCOUNTER — Encounter: Payer: Self-pay | Admitting: Internal Medicine

## 2022-06-01 NOTE — Telephone Encounter (Signed)
Patient with diagnosis of atrial fibrillation on Eliquis for anticoagulation.    Procedure: L2-3 laminectomy with possible interspinour plating Date of procedure: TBD   CHA2DS2-VASc Score = 5   This indicates a 7.2% annual risk of stroke. The patient's score is based upon: CHF History: 0 HTN History: 1 Diabetes History: 1 Stroke History: 0 Vascular Disease History: 1 Age Score: 2 Gender Score: 0    CrCl 39 Platelet count 186  Per office protocol, patient can hold Eliquis for 3 days prior to procedure.   Patient will not need bridging with Lovenox (enoxaparin) around procedure.  **This guidance is not considered finalized until pre-operative APP has relayed final recommendations.**

## 2022-06-02 ENCOUNTER — Other Ambulatory Visit: Payer: Self-pay | Admitting: Neurological Surgery

## 2022-06-02 NOTE — Telephone Encounter (Signed)
   Primary Cardiologist: Candee Furbish, MD  Chart reviewed as part of pre-operative protocol coverage. Given past medical history and time since last visit, based on ACC/AHA guidelines, Garrett RODRIQUEZ Sr.  Has undergone cardiac testing and would be at acceptable risk for the planned Jordan.   Per office protocol, patient can hold Eliquis for 3 days prior to Jordan.   Patient will not need bridging with Lovenox (enoxaparin) around Jordan. Garrett Jordan.   I will route this recommendation to the requesting party via Epic fax function and remove from pre-op pool.  Please call with questions.  Emmaline Life, NP-C    06/02/2022, 7:43 AM [place] [address] Office [phone] Fax [phone]

## 2022-06-07 IMAGING — US US RENAL ARTERY STENOSIS
1 series · 13 of 25 positions shown · non-contrast
Comparison: 08/19/2020

CLINICAL DATA: Acute kidney injury

EXAM:
RENAL/URINARY TRACT ULTRASOUND
RENAL DUPLEX DOPPLER ULTRASOUND

[Series 1: us renal artery stenosis · 0.30mm/px · 13 of 73 slices shown]
[im 1/73]
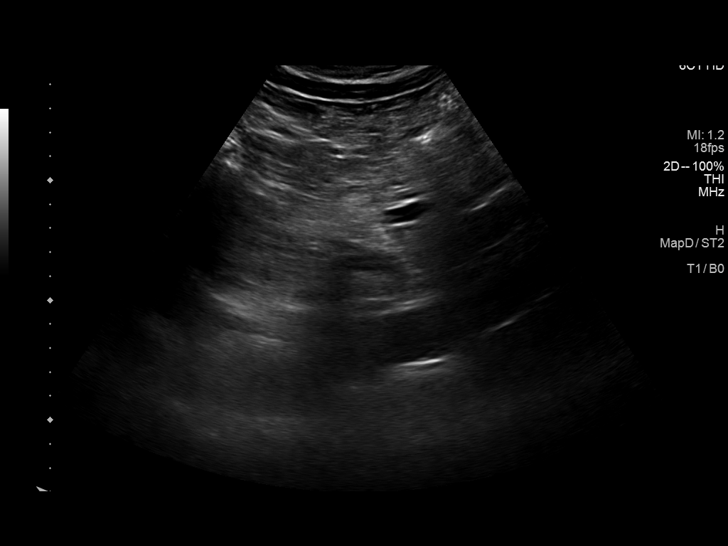
[im 7/73]
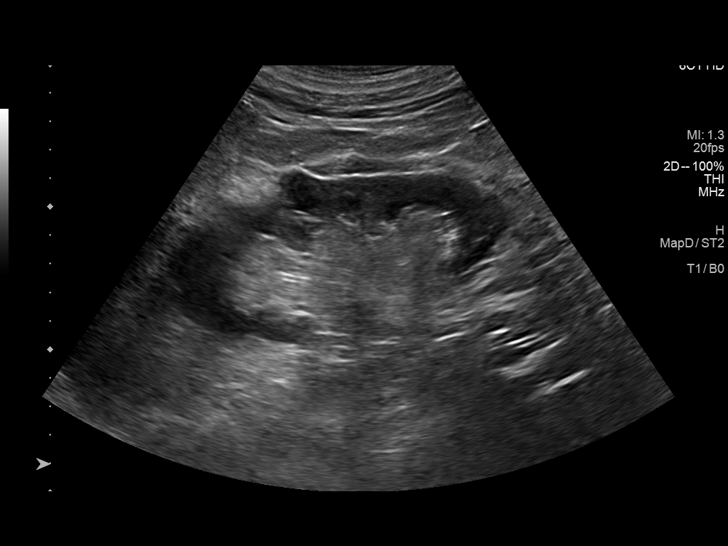
[im 13/73]
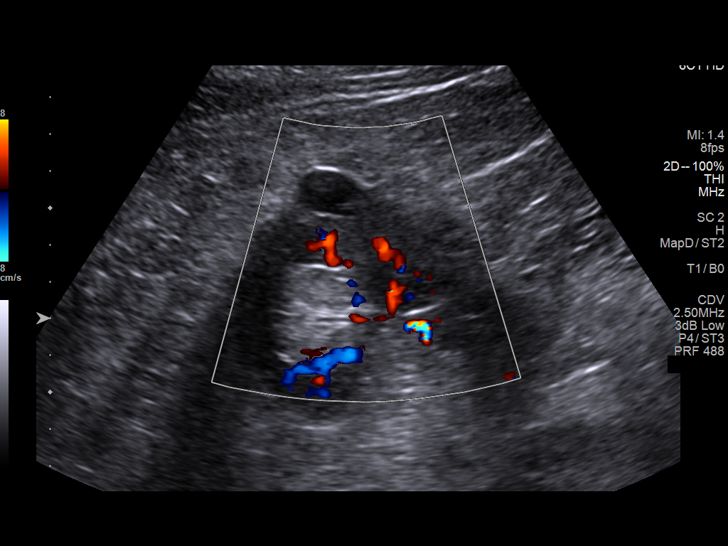
[im 19/73]
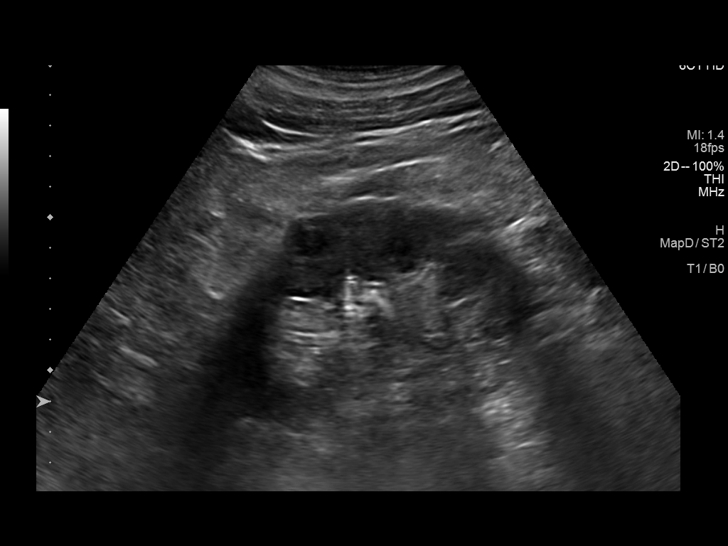
[im 25/73]
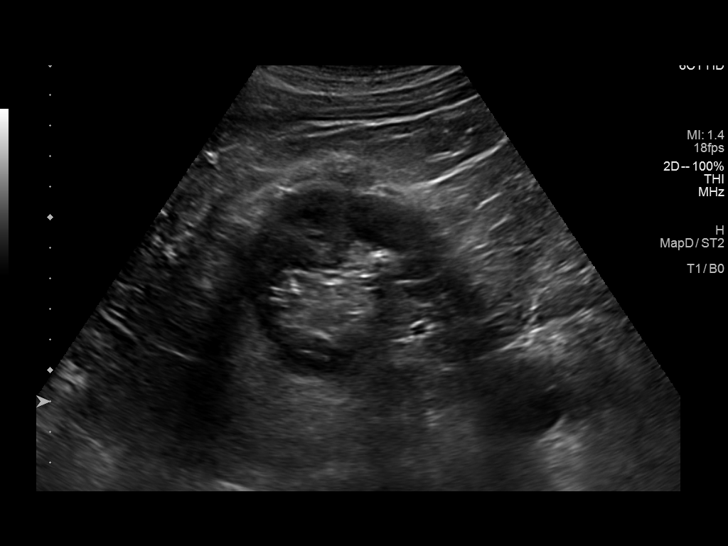
[im 31/73]
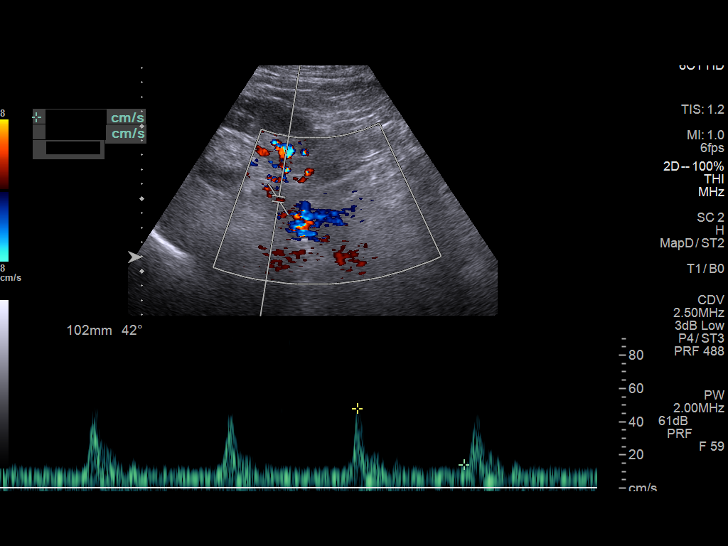
[im 37/73]
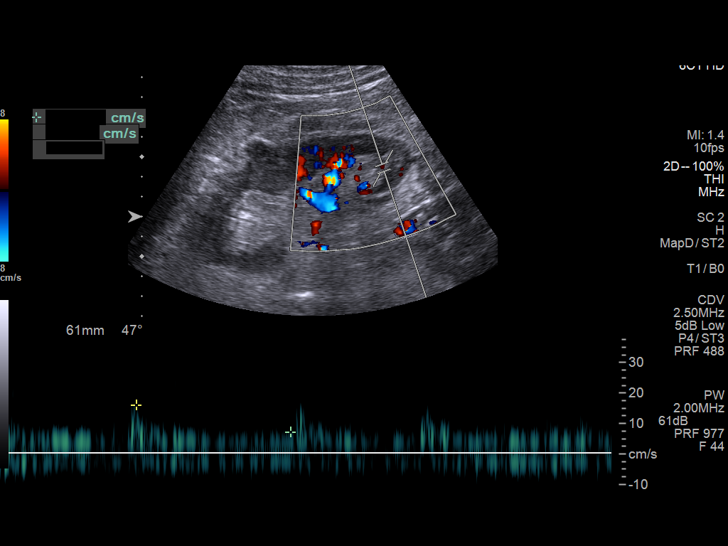
[im 43/73]
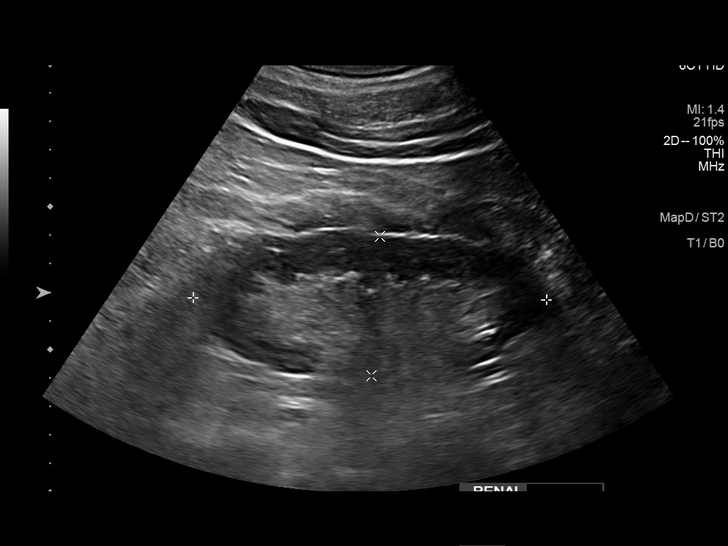
[im 49/73]
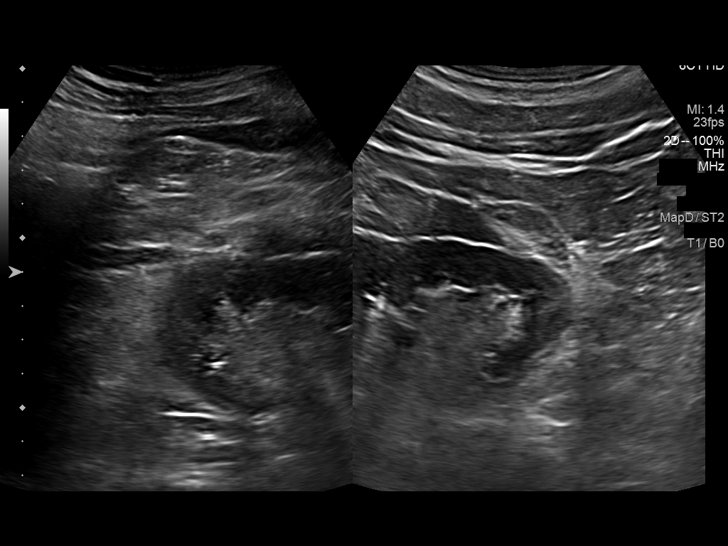
[im 55/73]
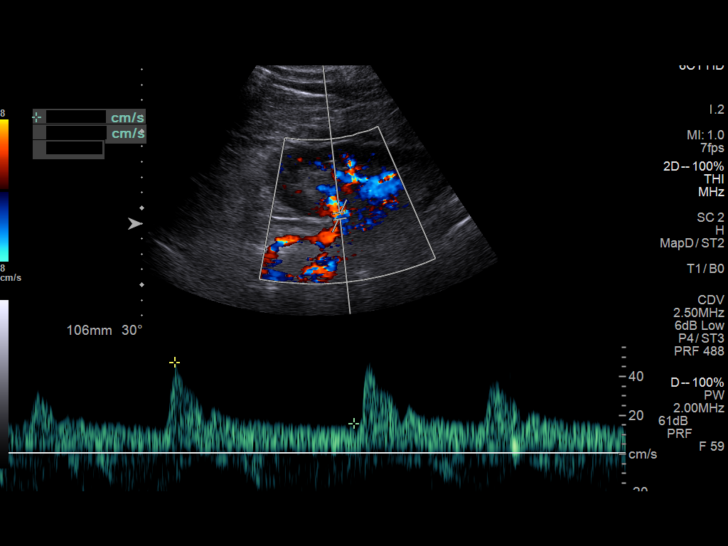
[im 61/73]
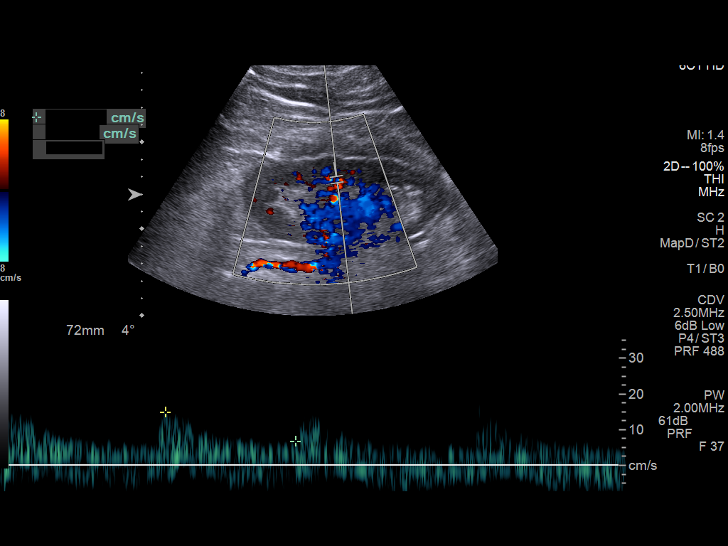
[im 67/73]
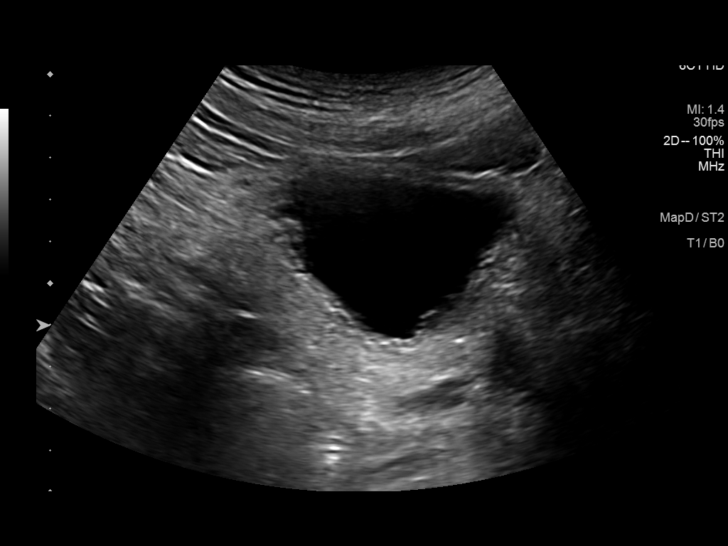
[im 73/73]
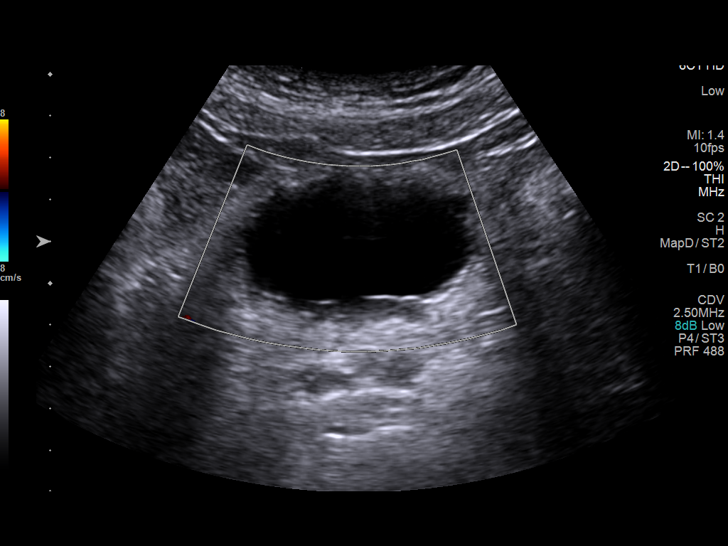

[13 of 25 positions shown; findings below may reference images not displayed]

FINDINGS: Right Kidney:

12.3 x 5.1 x 5.8 cm (190 cc). 1.6 x 1.4 cm hypoechoic partially
exophytic lesion from the midpole, stable on prior studies back to
05/24/2015 presumably benign. 1.7 x 1.2 x 1.6 cm cystic appearing
lesion from the inferior pole. 4 mm shadowing calculus in the mid
pole renal collecting system. Echogenicity within normal limits. No
hydronephrosis visualized.

Left Kidney:

12.4 x 4.9 x 5.1 cm (162 cc). Echogenicity within normal limits. No
mass or hydronephrosis visualized.

Bladder:  Incompletely distended, unremarkable.

RENAL DUPLEX ULTRASOUND

Right Renal Artery Velocities:

Origin:  54 cm/sec

Mid:  48 cm/sec

Hilum:  47 cm/sec

Interlobar:  28 cm/sec

Arcuate:  19 cm/sec

Left Renal Artery Velocities:

Origin:  40 cm/sec

Mid:  76 cm/sec

Hilum:  47 cm/sec

Interlobar:  23 cm/sec

Arcuate:  17 cm/sec

Aortic Velocity:  60 cm/sec

Right Renal-Aortic Ratios:

Origin:

Mid:

Hilum:

Interlobar:

Arcuate:

Left Renal-Aortic Ratios:

Origin:

Mid:

Hilum:

Interlobar:

Arcuate:
IMPRESSION: 1. Negative for hydronephrosis.
2. Nonobstructive right nephrolithiasis.
3. No Doppler ultrasound suggestion of hemodynamically significant
renal artery stenosis. If there is continued clinical concern, renal
MRA (lower radiation risk, can be performed noncontrast in the
setting of renal dysfunction) and CTA ( higher spatial resolution)
represent more accurate studies, which are additionally more
sensitive to the detection of duplicated renal arteries.

## 2022-06-11 ENCOUNTER — Other Ambulatory Visit: Payer: Self-pay | Admitting: Cardiology

## 2022-06-12 NOTE — Pre-Procedure Instructions (Signed)
Surgical Instructions    Your procedure is scheduled on Friday, August 4th.  Report to [facility] Main Entrance "A" at 05:30 A.M., then check in with the Admitting office.  Call this number if you have problems the morning of surgery:  [phone]   If you have any questions prior to your surgery date call [phone]: Open Monday-Friday 8am-4pm    Remember:  Do not eat or drink after midnight the night before your surgery      Take these medicines the morning of surgery with A SIP OF WATER  amLODipine (NORVASC) ezetimibe (ZETIA) fenofibrate micronized (LOFIBRA)  levothyroxine (SYNTHROID, LEVOTHROID) metoprolol tartrate (LOPRESSOR)  VASCEPA   If needed: acetaminophen (TYLENOL) fluticasone (FLONASE)  Polyethyl Glycol-Propyl Glycol (SYSTANE OP)    WHAT DO I DO ABOUT MY DIABETES MEDICATION?   Hold FARXIGA 72 hours prior to surgery.   Day before surgery (8/3): Take only morning dose of glipiZIDE (GLUCOTROL). Do NOT take evening dose.  Do not take  glipiZIDE (GLUCOTROL) the morning of surgery (8/4).     HOW TO MANAGE YOUR DIABETES BEFORE AND AFTER SURGERY  Why is it important to control my blood sugar before and after surgery? Improving blood sugar levels before and after surgery helps healing and can limit problems. A way of improving blood sugar control is eating a healthy diet by:  Eating less sugar and carbohydrates  Increasing activity/exercise  Talking with your doctor about reaching your blood sugar goals High blood sugars (greater than 180 mg/dL) can raise your risk of infections and slow your recovery, so you will need to focus on controlling your diabetes during the weeks before surgery. Make sure that the doctor who takes care of your diabetes knows about your planned surgery including the date and location.  How do I manage my blood sugar before surgery? Check your blood sugar at least 4 times a day, starting 2 days before surgery, to make sure that the  level is not too high or low.  Check your blood sugar the morning of your surgery when you wake up and every 2 hours until you get to the Short Stay unit.  If your blood sugar is less than 70 mg/dL, you will need to treat for low blood sugar: Do not take insulin. Treat a low blood sugar (less than 70 mg/dL) with  cup of clear juice (cranberry or apple), 4 glucose tablets, OR glucose gel. Recheck blood sugar in 15 minutes after treatment (to make sure it is greater than 70 mg/dL). If your blood sugar is not greater than 70 mg/dL on recheck, call [phone] for further instructions. Report your blood sugar to the short stay nurse when you get to Short Stay.  If you are admitted to the hospital after surgery: Your blood sugar will be checked by the staff and you will probably be given insulin after surgery (instead of oral diabetes medicines) to make sure you have good blood sugar levels. The goal for blood sugar control after surgery is 80-180 mg/dL.   As of today, STOP taking any Aspirin (unless otherwise instructed by your surgeon) Aleve, Naproxen, Ibuprofen, Motrin, Advil, Goody's, BC's, all herbal medications, fish oil, and all vitamins.                     Do NOT Smoke (Tobacco/Vaping) for 24 hours prior to your procedure.  If you use a CPAP at night, you may bring your mask/headgear for your overnight stay.   Contacts, glasses, piercing's,  hearing aid's, dentures or partials may not be worn into surgery, please bring cases for these belongings.    For patients admitted to the hospital, discharge time will be determined by your treatment team.   Patients discharged the day of surgery will not be allowed to drive home, and someone needs to stay with them for 24 hours.  SURGICAL WAITING ROOM VISITATION Patients having surgery or a procedure may have no more than 2 support people in the waiting area - these visitors may rotate.   Children under the age of 53 must have an adult with  them who is not the patient. If the patient needs to stay at the hospital during part of their recovery, the visitor guidelines for inpatient rooms apply. Pre-op nurse will coordinate an appropriate time for 1 support person to accompany patient in pre-op.  This support person may not rotate.   Please refer to the [facility] website for the visitor guidelines for Inpatients (after your surgery is over and you are in a regular room).    Special instructions:   - Preparing For Surgery  Before surgery, you can play an important role. Because skin is not sterile, your skin needs to be as free of germs as possible. You can reduce the number of germs on your skin by washing with CHG (chlorahexidine gluconate) Soap before surgery.  CHG is an antiseptic cleaner which kills germs and bonds with the skin to continue killing germs even after washing.    Oral Hygiene is also important to reduce your risk of infection.  Remember - BRUSH YOUR TEETH THE MORNING OF SURGERY WITH YOUR REGULAR TOOTHPASTE  Please do not use if you have an allergy to CHG or antibacterial soaps. If your skin becomes reddened/irritated stop using the CHG.  Do not shave (including legs and underarms) for at least 48 hours prior to first CHG shower. It is OK to shave your face.  Please follow these instructions carefully.   Shower the NIGHT BEFORE SURGERY and the MORNING OF SURGERY  If you chose to wash your hair, wash your hair first as usual with your normal shampoo.  After you shampoo, rinse your hair and body thoroughly to remove the shampoo.  Use CHG Soap as you would any other liquid soap. You can apply CHG directly to the skin and wash gently with a scrungie or a clean washcloth.   Apply the CHG Soap to your body ONLY FROM THE NECK DOWN.  Do not use on open wounds or open sores. Avoid contact with your eyes, ears, mouth and genitals (private parts). Wash Face and genitals (private parts)  with your normal soap.    Wash thoroughly, paying special attention to the area where your surgery will be performed.  Thoroughly rinse your body with warm water from the neck down.  DO NOT shower/wash with your normal soap after using and rinsing off the CHG Soap.  Pat yourself dry with a CLEAN TOWEL.  Wear CLEAN PAJAMAS to bed the night before surgery  Place CLEAN SHEETS on your bed the night before your surgery  DO NOT SLEEP WITH PETS.   Day of Surgery: Take a shower with CHG soap. Do not wear jewelry  Do not wear lotions, powders, colognes, or deodorant. Men may shave face and neck. Do not bring valuables to the hospital.  [facility] is not responsible for any belongings or valuables.  Wear Clean/Comfortable clothing the morning of surgery Remember to brush your teeth WITH  YOUR REGULAR TOOTHPASTE.   Please read over the following fact sheets that you were given.    If you received a COVID test during your pre-op visit  it is requested that you wear a mask when out in public, stay away from anyone that may not be feeling well and notify your surgeon if you develop symptoms. If you have been in contact with anyone that has tested positive in the last 10 days please notify you surgeon.

## 2022-06-13 ENCOUNTER — Other Ambulatory Visit: Payer: Self-pay

## 2022-06-13 ENCOUNTER — Encounter (HOSPITAL_COMMUNITY): Payer: Self-pay

## 2022-06-13 ENCOUNTER — Encounter (HOSPITAL_COMMUNITY)
Admission: RE | Admit: 2022-06-13 | Discharge: 2022-06-13 | Disposition: A | Discharge status: Home / Self Care | Payer: Medicare Other | Source: Ambulatory Visit | Attending: Neurological Surgery | Admitting: Neurological Surgery

## 2022-06-13 VITALS — BP 136/95 | HR 70 | Temp 97.8°F | Resp 18 | Ht 69.5 in | Wt 203.1 lb

## 2022-06-13 DIAGNOSIS — I2510 Atherosclerotic heart disease of native coronary artery without angina pectoris: Secondary | ICD-10-CM | POA: Insufficient documentation

## 2022-06-13 DIAGNOSIS — Z8249 Family history of ischemic heart disease and other diseases of the circulatory system: Secondary | ICD-10-CM | POA: Diagnosis not present

## 2022-06-13 DIAGNOSIS — K219 Gastro-esophageal reflux disease without esophagitis: Secondary | ICD-10-CM | POA: Insufficient documentation

## 2022-06-13 DIAGNOSIS — Z7982 Long term (current) use of aspirin: Secondary | ICD-10-CM | POA: Diagnosis not present

## 2022-06-13 DIAGNOSIS — I129 Hypertensive chronic kidney disease with stage 1 through stage 4 chronic kidney disease, or unspecified chronic kidney disease: Secondary | ICD-10-CM | POA: Insufficient documentation

## 2022-06-13 DIAGNOSIS — Z8616 Personal history of COVID-19: Secondary | ICD-10-CM | POA: Insufficient documentation

## 2022-06-13 DIAGNOSIS — Z01818 Encounter for other preprocedural examination: Secondary | ICD-10-CM

## 2022-06-13 DIAGNOSIS — Z7902 Long term (current) use of antithrombotics/antiplatelets: Secondary | ICD-10-CM | POA: Insufficient documentation

## 2022-06-13 DIAGNOSIS — E1141 Type 2 diabetes mellitus with diabetic mononeuropathy: Secondary | ICD-10-CM | POA: Diagnosis not present

## 2022-06-13 DIAGNOSIS — Z7901 Long term (current) use of anticoagulants: Secondary | ICD-10-CM | POA: Insufficient documentation

## 2022-06-13 DIAGNOSIS — Z01812 Encounter for preprocedural laboratory examination: Secondary | ICD-10-CM | POA: Insufficient documentation

## 2022-06-13 DIAGNOSIS — I25810 Atherosclerosis of coronary artery bypass graft(s) without angina pectoris: Secondary | ICD-10-CM | POA: Insufficient documentation

## 2022-06-13 DIAGNOSIS — M4316 Spondylolisthesis, lumbar region: Secondary | ICD-10-CM | POA: Insufficient documentation

## 2022-06-13 DIAGNOSIS — Z823 Family history of stroke: Secondary | ICD-10-CM | POA: Diagnosis not present

## 2022-06-13 DIAGNOSIS — Z951 Presence of aortocoronary bypass graft: Secondary | ICD-10-CM | POA: Diagnosis not present

## 2022-06-13 DIAGNOSIS — Z833 Family history of diabetes mellitus: Secondary | ICD-10-CM | POA: Diagnosis not present

## 2022-06-13 DIAGNOSIS — I2119 ST elevation (STEMI) myocardial infarction involving other coronary artery of inferior wall: Secondary | ICD-10-CM | POA: Diagnosis not present

## 2022-06-13 DIAGNOSIS — Z79899 Other long term (current) drug therapy: Secondary | ICD-10-CM | POA: Diagnosis not present

## 2022-06-13 DIAGNOSIS — I2121 ST elevation (STEMI) myocardial infarction involving left circumflex coronary artery: Secondary | ICD-10-CM | POA: Diagnosis not present

## 2022-06-13 DIAGNOSIS — I071 Rheumatic tricuspid insufficiency: Secondary | ICD-10-CM | POA: Diagnosis not present

## 2022-06-13 DIAGNOSIS — E039 Hypothyroidism, unspecified: Secondary | ICD-10-CM | POA: Insufficient documentation

## 2022-06-13 DIAGNOSIS — Z7984 Long term (current) use of oral hypoglycemic drugs: Secondary | ICD-10-CM | POA: Insufficient documentation

## 2022-06-13 DIAGNOSIS — I2111 ST elevation (STEMI) myocardial infarction involving right coronary artery: Secondary | ICD-10-CM | POA: Diagnosis not present

## 2022-06-13 DIAGNOSIS — I472 Ventricular tachycardia, unspecified: Secondary | ICD-10-CM | POA: Diagnosis not present

## 2022-06-13 DIAGNOSIS — N179 Acute kidney failure, unspecified: Secondary | ICD-10-CM | POA: Diagnosis not present

## 2022-06-13 DIAGNOSIS — I213 ST elevation (STEMI) myocardial infarction of unspecified site: Secondary | ICD-10-CM | POA: Diagnosis not present

## 2022-06-13 DIAGNOSIS — E785 Hyperlipidemia, unspecified: Secondary | ICD-10-CM | POA: Insufficient documentation

## 2022-06-13 DIAGNOSIS — I4510 Unspecified right bundle-branch block: Secondary | ICD-10-CM | POA: Diagnosis not present

## 2022-06-13 DIAGNOSIS — I480 Paroxysmal atrial fibrillation: Secondary | ICD-10-CM | POA: Diagnosis not present

## 2022-06-13 DIAGNOSIS — E782 Mixed hyperlipidemia: Secondary | ICD-10-CM | POA: Diagnosis not present

## 2022-06-13 DIAGNOSIS — N1832 Chronic kidney disease, stage 3b: Secondary | ICD-10-CM | POA: Diagnosis not present

## 2022-06-13 DIAGNOSIS — I4891 Unspecified atrial fibrillation: Secondary | ICD-10-CM | POA: Insufficient documentation

## 2022-06-13 DIAGNOSIS — Z87442 Personal history of urinary calculi: Secondary | ICD-10-CM | POA: Diagnosis not present

## 2022-06-13 DIAGNOSIS — I1310 Hypertensive heart and chronic kidney disease without heart failure, with stage 1 through stage 4 chronic kidney disease, or unspecified chronic kidney disease: Secondary | ICD-10-CM | POA: Diagnosis not present

## 2022-06-13 DIAGNOSIS — Z87891 Personal history of nicotine dependence: Secondary | ICD-10-CM | POA: Insufficient documentation

## 2022-06-13 DIAGNOSIS — N183 Chronic kidney disease, stage 3 unspecified: Secondary | ICD-10-CM | POA: Insufficient documentation

## 2022-06-13 DIAGNOSIS — R079 Chest pain, unspecified: Secondary | ICD-10-CM | POA: Diagnosis not present

## 2022-06-13 DIAGNOSIS — E1122 Type 2 diabetes mellitus with diabetic chronic kidney disease: Secondary | ICD-10-CM | POA: Insufficient documentation

## 2022-06-13 DIAGNOSIS — Z8679 Personal history of other diseases of the circulatory system: Secondary | ICD-10-CM | POA: Diagnosis not present

## 2022-06-13 HISTORY — DX: Cardiac arrhythmia, unspecified: I49.9

## 2022-06-13 HISTORY — DX: Aneurysm of the ascending aorta, without rupture: I71.21

## 2022-06-13 HISTORY — DX: Personal history of urinary calculi: Z87.442

## 2022-06-13 HISTORY — DX: COVID-19: U07.1

## 2022-06-13 LAB — PROTIME-INR
INR: 1.2 (ref 0.8–1.2)
Prothrombin Time: 14.6 seconds (ref 11.4–15.2)

## 2022-06-13 LAB — CBC
HCT: 48.4 % (ref 39.0–52.0)
Hemoglobin: 16.6 g/dL (ref 13.0–17.0)
MCH: 31.1 pg (ref 26.0–34.0)
MCHC: 34.3 g/dL (ref 30.0–36.0)
MCV: 90.8 fL (ref 80.0–100.0)
Platelets: 166 10*3/uL (ref 150–400)
RBC: 5.33 MIL/uL (ref 4.22–5.81)
RDW: 13.1 % (ref 11.5–15.5)
WBC: 5.5 10*3/uL (ref 4.0–10.5)
nRBC: 0 % (ref 0.0–0.2)

## 2022-06-13 LAB — BASIC METABOLIC PANEL
Anion gap: 11 (ref 5–15)
BUN: 27 mg/dL — ABNORMAL HIGH (ref 8–23)
CO2: 25 mmol/L (ref 22–32)
Calcium: 10.5 mg/dL — ABNORMAL HIGH (ref 8.9–10.3)
Chloride: 101 mmol/L (ref 98–111)
Creatinine, Ser: 1.84 mg/dL — ABNORMAL HIGH (ref 0.61–1.24)
GFR, Estimated: 37 mL/min — ABNORMAL LOW (ref >60)
Glucose, Bld: 163 mg/dL — ABNORMAL HIGH (ref 70–99)
Potassium: 4.1 mmol/L (ref 3.5–5.1)
Sodium: 137 mmol/L (ref 135–145)

## 2022-06-13 LAB — TYPE AND SCREEN
ABO/RH(D): O POS
Antibody Screen: NEGATIVE

## 2022-06-13 LAB — HEMOGLOBIN A1C
Hgb A1c MFr Bld: 6.7 % — ABNORMAL HIGH (ref 4.8–5.6)
Mean Plasma Glucose: 145.59 mg/dL

## 2022-06-13 LAB — SURGICAL PCR SCREEN
MRSA, PCR: NEGATIVE
Staphylococcus aureus: NEGATIVE

## 2022-06-13 NOTE — Progress Notes (Addendum)
PCP - Dr. Marijo File Cardiologist - Dr. Marlou Porch  EKG - 05/15/22 Stress Test - 05/31/22 ECHO - 05/31/22  Cardiac Cath - 01/08/15   Fasting Blood Sugar - 140 Checks Blood Sugar ___2__ times a day A1C - 6.7 - done today  Blood Thinner Instructions: Pt instructed to hold his Eliquis 3 days prior and hold his Plavix 5 days prior by Dr. Marlou Porch. Pt states he stopped both at the same time - last dose was 06/11/22.  Aspirin Instructions:   Anesthesia review: yes, heart hx, increased Creatinine  Patient denies shortness of breath, fever, cough and chest pain at PAT appointment   All instructions explained to the patient, with a verbal understanding of the material. Patient agrees to go over the instructions while at home for a better understanding. Patient also instructed to self quarantine after being tested for COVID-19. The opportunity to ask questions was provided.

## 2022-06-13 NOTE — Pre-Procedure Instructions (Signed)
Surgical Instructions    Your procedure is scheduled on Friday, 06/16/22 at 7:30 AM.  Report to [facility] Main Entrance "A" at 5:30 AM, check in with the Admitting office.  Call this number if you have problems the morning of surgery:  [phone]   If you have any questions prior to your surgery date call [phone]: Open Monday-Friday 8am-4pm    Remember:  Do not eat or drink after midnight the night before your surgery.    Take these medicines the morning of surgery with A SIP OF WATER: amLODipine (NORVASC) ezetimibe (ZETIA) fenofibrate micronized (LOFIBRA)  levothyroxine (SYNTHROID, LEVOTHROID) metoprolol tartrate (LOPRESSOR)  VASCEPA  If needed: acetaminophen (TYLENOL) fluticasone (FLONASE)  Polyethyl Glycol-Propyl Glycol (SYSTANE OP)  WHAT DO I DO ABOUT MY DIABETES MEDICATION?   Hold FARXIGA 72 hours prior to surgery.   Day before surgery (8/3): Take only morning dose of glipiZIDE (GLUCOTROL). Do NOT take evening dose.  Do not take  glipiZIDE (GLUCOTROL) the morning of surgery (8/4).    HOW TO MANAGE YOUR DIABETES BEFORE AND AFTER SURGERY  Why is it important to control my blood sugar before and after surgery? Improving blood sugar levels before and after surgery helps healing and can limit problems. A way of improving blood sugar control is eating a healthy diet by:  Eating less sugar and carbohydrates  Increasing activity/exercise  Talking with your doctor about reaching your blood sugar goals High blood sugars (greater than 180 mg/dL) can raise your risk of infections and slow your recovery, so you will need to focus on controlling your diabetes during the weeks before surgery. Make sure that the doctor who takes care of your diabetes knows about your planned surgery including the date and location.  How do I manage my blood sugar before surgery? Check your blood sugar at least 4 times a day, starting 2 days before surgery, to make sure that the level is  not too high or low.  Check your blood sugar the morning of your surgery when you wake up and every 2 hours until you get to the Short Stay unit.  If your blood sugar is less than 70 mg/dL, you will need to treat for low blood sugar: Do not take insulin. Treat a low blood sugar (less than 70 mg/dL) with  cup of clear juice (cranberry or apple), 4 glucose tablets, OR glucose gel. Recheck blood sugar in 15 minutes after treatment (to make sure it is greater than 70 mg/dL). If your blood sugar is not greater than 70 mg/dL on recheck, call [phone] for further instructions. Report your blood sugar to the short stay nurse when you get to Short Stay.  If you are admitted to the hospital after surgery: Your blood sugar will be checked by the staff and you will probably be given insulin after surgery (instead of oral diabetes medicines) to make sure you have good blood sugar levels. The goal for blood sugar control after surgery is 80-180 mg/dL.   STOP Plavix and Eliquis as instructed by your cardiologist. Plavix instructed to hold 5 days prior to surgery and Eliquis 2 days prior to surgery.  As of today, STOP taking any Aspirin (unless otherwise instructed by your surgeon) Aleve, Naproxen, Ibuprofen, Motrin, Advil, Goody's, BC's, all herbal medications, fish oil, and all vitamins.  If you use a CPAP at night, you may bring your mask/headgear for your overnight stay.   Contacts, glasses, piercing's, hearing aid's, dentures or partials may not be worn into surgery,  please bring cases for these belongings.    For patients admitted to the hospital, discharge time will be determined by your treatment team.   Patients discharged the day of surgery will not be allowed to drive home, and someone needs to stay with them for 24 hours.  SURGICAL WAITING ROOM VISITATION Patients having surgery or a procedure may have no more than 2 support people in the waiting area - these visitors may rotate.    Children under the age of 54 must have an adult with them who is not the patient. If the patient needs to stay at the hospital during part of their recovery, the visitor guidelines for inpatient rooms apply. Pre-op nurse will coordinate an appropriate time for 1 support person to accompany patient in pre-op.  This support person may not rotate.   Please refer to the [facility] website for the visitor guidelines for Inpatients (after your surgery is over and you are in a regular room).    Special instructions:   [facility]- Preparing For Surgery  Before surgery, you can play an important role. Because skin is not sterile, your skin needs to be as free of germs as possible. You can reduce the number of germs on your skin by washing with CHG (chlorahexidine gluconate) Soap before surgery.  CHG is an antiseptic cleaner which kills germs and bonds with the skin to continue killing germs even after washing.    Oral Hygiene is also important to reduce your risk of infection.  Remember - BRUSH YOUR TEETH THE MORNING OF SURGERY WITH YOUR REGULAR TOOTHPASTE  Please do not use if you have an allergy to CHG or antibacterial soaps. If your skin becomes reddened/irritated stop using the CHG.  Do not shave (including legs and underarms) for at least 48 hours prior to first CHG shower. It is OK to shave your face.  Please follow these instructions carefully.   Shower the NIGHT BEFORE SURGERY and the MORNING OF SURGERY  If you chose to wash your hair, wash your hair first as usual with your normal shampoo.  After you shampoo, rinse your hair and body thoroughly to remove the shampoo.  Use CHG Soap as you would any other liquid soap. You can apply CHG directly to the skin and wash gently with a scrungie or a clean washcloth.   Apply the CHG Soap to your body ONLY FROM THE NECK DOWN.  Do not use on open wounds or open sores. Avoid contact with your eyes, ears, mouth and genitals (private parts). Wash Face  and genitals (private parts)  with your normal soap.   Wash thoroughly, paying special attention to the area where your surgery will be performed.  Thoroughly rinse your body with warm water from the neck down.  DO NOT shower/wash with your normal soap after using and rinsing off the CHG Soap.  Pat yourself dry with a CLEAN TOWEL.  Wear CLEAN PAJAMAS to bed the night before surgery  Place CLEAN SHEETS on your bed the night before your surgery  DO NOT SLEEP WITH PETS.   Day of Surgery: Take a shower with CHG soap. Do not wear jewelry  Do not wear lotions, powders, cologne or deodorant. Men may shave face and neck. Do not bring valuables to the hospital.  [facility] is not responsible for any belongings or valuables.  Wear Clean/Comfortable clothing the morning of surgery Remember to brush your teeth WITH YOUR REGULAR TOOTHPASTE.   Please read over the following fact  sheets that you were given.    If you received a COVID test during your pre-op visit  it is requested that you wear a mask when out in public, stay away from anyone that may not be feeling well and notify your surgeon if you develop symptoms. If you have been in contact with anyone that has tested positive in the last 10 days please notify you surgeon.

## 2022-06-14 ENCOUNTER — Encounter (HOSPITAL_COMMUNITY): Payer: Self-pay | Admitting: Vascular Surgery

## 2022-06-14 ENCOUNTER — Encounter (HOSPITAL_COMMUNITY): Payer: Self-pay

## 2022-06-14 NOTE — Progress Notes (Signed)
Anesthesia Chart Review:  Case: [id number] Date/Time: 06/16/22 0715   Procedure: Laminectomy and Foraminotomy - L2-L3 - bilateral, possible interspinous plating (Bilateral)   Anesthesia type: General   Pre-op diagnosis: Spondylolisthesis   Location: [facility] OR ROOM 64 / [place] [facility]   Surgeons: Eustace Moore, MD       DISCUSSION: Patient is a 81 year old male scheduled for the above procedure.  History includes former smoker (quit 08/01/70), CAD (inferior MI 1992; s/p proximal & mid RCA stents 1990's; distal RCA stent 08/21/05; CABG 04/29/09: LIMA-LAD, SVG-OM2, SVG-RV marginal branch, SVG -PL, SVG-PDA; occluded SVG-small PDA 01/08/15), HTN, HLD, DM2, afib (04/2009; 07/15/21), AAA (coil embolization of right hypogastric artery 02/28/12, s/p EVAR 08/08/12, type II endoleak, s/p coil embolization 07/16/15), ascending thoracic aortic aneurysm (4.6 cm TAA by 03/08/22 CT), CKD (stage 3), prostate cancer (s/p I-125 brachytherapy 11/16/08), hypothyroidism, GERD, spinal surgery (left L4-5 laminectomy/discectomy 01/17/05)   Primary cardiologist is Dr. Marlou Porch. He had preoperative evaluation by Lenna Sciara, MD on 05/15/22. Stress test and echo ordered. 05/31/22 stress test was normal, and echo showed LVEF 55-60%, no regional wall motion abnormalities, mild LVH, mildly reduced RVSF, mild RVH, normal PASP, moderately dilated LA/RA, trivial MR, mild-moderate TR, small ascending TAA measuring 4.3 cm. Preoperative cardiology input outlined by Christen Bame, NP on 06/02/22: "Given past medical history and time since last visit, based on ACC/AHA guidelines, Garrett Meek Sr.  Has undergone cardiac testing and would be at acceptable risk for the planned procedure.    Per office protocol, patient can hold Eliquis for 3 days prior to procedure.   Patient will not need bridging with Lovenox (enoxaparin) around procedure. He may hold Plavix for 5 days prior to procedure and should resume as soon as hemodynamically stable following the procedure."   - Patient reported that he stopped Eliquis and Plavix on the same day, last dose 06/11/22.     Preoperative labs show Cr 1.84, eGFR 37 which is consistent with known comparison labs and CKD stage III history.   Anesthesia team to evaluate on the day of surgery.    VS: BP (!) 136/95   Pulse 70   Temp 36.6 C   Resp 18   Ht 5' 9.5" (1.765 m)   Wt 92.1 kg   SpO2 97%   BMI 29.56 kg/m   PROVIDERS: Antony Contras, MD is PCP  - Candee Furbish, MD is cardiologist - Virl Axe, MD is EP cardiologist. Evaluated 01/21/20 following Holter monitor that showed asymptomatic pauses. He wrote, "...given evidence of extracardiac effect and lack of symptoms no therapy is indicated." - Shamleffer, Mammie Lorenzo, MD is endocrinologist - Prescott Gum, Collier Salina, MD is CT surgeon. Last visit with Jadene Pierini, PA-C on 03/08/22. TAA increased to 4.6 cm by CT with six month follow-up planned. Gean Quint, MD is nephrologist  - Franchot Gallo, MD is urologist - Trula Slade, V. Rock Nephew, MD is vascular surgeon - Jacqulynn Cadet, MD is IR (endoleak coiling)   LABS: Labs reviewed: Acceptable for surgery. Renal function appears stable.  (all labs ordered are listed, but only abnormal results are displayed)  Labs Reviewed  HEMOGLOBIN A1C - Abnormal; Notable for the following components:      Result Value   Hgb A1c MFr Bld 6.7 (*)    All other components within normal limits  BASIC METABOLIC PANEL - Abnormal; Notable for the following components:   Glucose, Bld 163 (*)    BUN 27 (*)    Creatinine, Ser 1.84 (*)  Calcium 10.5 (*)    GFR, Estimated 37 (*)    All other components within normal limits  SURGICAL PCR SCREEN  PROTIME-INR  CBC  TYPE AND SCREEN   PFTs 01/06/19: FVC 3.99 (93%), post 4.20 (98%). FEV1 2.90 (93%), post 3.16 (102%). DLCO unc 21.56 (85%).   IMAGES: CT Chest 03/08/22: IMPRESSION: Ascending aortic aneurysm measures up to 4.6 cm, previously 4.3 cm. Recommend semi-annual imaging followup by  CTA or MRA and referral to cardiothoracic surgery if not already obtained. This recommendation follows 2010 ACCF/AHA/AATS/ACR/ASA/SCA/SCAI/SIR/STS/SVM Guidelines for the Diagnosis and Management of Patients With Thoracic Aortic Disease. Circulation. 2010; 121: W263-Z858. Aortic aneurysm NOS (ICD10-I71.9)   CTA Abd/Pelvis 08/12/21: IMPRESSION: VASCULAR Stable stent graft repair of the infrarenal AAA including IMA coil embolization and right internal iliac coil embolization. No definitive endoleak by single phase arterial imaging. Stable native aneurysm sac diameter at 6.4 x 4.7 cm.   NON-VASCULAR No other acute intra-abdominal or pelvic finding. Mild hepatic steatosis Cholelithiasis Nonobstructing nephrolithiasis   US Renal 07/04/21: IMPRESSION: 1. Negative for hydronephrosis. 2. Nonobstructive right nephrolithiasis. 3. No Doppler ultrasound suggestion of hemodynamically significant renal artery stenosis. If there is continued clinical concern, renal MRA (lower radiation risk, can be performed noncontrast in the setting of renal dysfunction) and CTA ( higher spatial resolution) represent more accurate studies, which are additionally more sensitive to the detection of duplicated renal arteries.   EKG: 05/15/22: Atrial fibrillation at 78 bpm.  Premature ventricular or aberrantly conducted complexes.  Right bundle branch block.   CV: Echo 05/31/22: IMPRESSIONS   1. Left ventricular ejection fraction, by estimation, is 55 to 60%. The  left ventricle has normal function. The left ventricle has no regional  wall motion abnormalities. There is mild left ventricular hypertrophy.  Left ventricular diastolic parameters  are indeterminate.   2. Right ventricular systolic function is mildly reduced. The right  ventricular size is mildly enlarged. There is normal pulmonary artery  systolic pressure.   3. Left atrial size was moderately dilated.   4. Right atrial size was moderately  dilated.   5. The mitral valve is normal in structure. Trivial mitral valve  regurgitation.   6. Tricuspid valve regurgitation is mild to moderate.   7. The aortic valve is tricuspid. Aortic valve regurgitation is not  visualized.   8. Small ascending aortic aneurysm 4.3 cm. Aortic dilatation noted.   9. The inferior vena cava is normal in size with greater than 50%  respiratory variability, suggesting right atrial pressure of 3 mmHg.    Nuclear stress test 05/31/22:   The study is normal. The study is low risk.   No ST deviation was noted.   Left ventricular function is normal. Nuclear stress EF: 61 %. The left ventricular ejection fraction is normal (55-65%). End diastolic cavity size is normal.   Prior study available for comparison from 03/10/2020.    Long term Holter Monitor 12/24/19-01/07/20: Sinus pauses x 3, longest is 3.1 to 3.8 second during day time hours. Sinus rhythm, occasional PVC. Rare atrial tachycardia. Occasional baseline artifact but no clear evidence of atrial fibrialltion This monitor is similar to prior. Will discontinue metoprolol given pauses. I would like for him to have an EP consult given his underlying conduction disease and potential need for pacemaker. Candee Furbish, MD    Cardiac cath 01/08/15: IMPRESSIONS: Severe native three-vessel coronary artery disease 2.  Occlusion of the saphenous vein graft to very small posterior descending artery with stenosis in the right coronary artery graft  prior to that in the place of a previous stent, the other grafts are widely patent 3.  Preserved LV function with inferior hypokinesis 4.  Moderately severe stenosis at the distal portion of a small graft to the right ventricular branch   RECOMMENDATION:  Continued medical therapy at the present time.    Past Medical History:  Diagnosis Date   AAA (abdominal aortic aneurysm) (HCC)    Arthritis    knees   Atrial fibrillation (HCC)    CAD (coronary artery disease)     Cancer ([place])    prostate   COVID    06/2019 and in 2022 states they were mild cases   Diabetes mellitus without complication ([place])    Dysrhythmia    A-fib pt is on Eliquis   GERD (gastroesophageal reflux disease)    Gout    History of kidney stones    Hyperlipidemia    Hypertension    Hypothyroidism    Myocardial infarction ([place]) 01/1991   sp inferior   Nerve compression    right leg   Reflux    Seasonal allergies    Thyroid disease    hypothyroidism    Past Surgical History:  Procedure Laterality Date   ABDOMINAL AORTAGRAM N/A 02/28/2012   Procedure: ABDOMINAL Maxcine Ham;  Surgeon: Serafina Mitchell, MD;  Location: [facility] CATH LAB;  Service: Cardiovascular;  Laterality: N/A;   abdominal aortagram embolization  02/28/2012   ABDOMINAL AORTIC ANEURYSM REPAIR  Sept. 2013   CORONARY ARTERY BYPASS GRAFT  04/2009   CORONARY STENT PLACEMENT  1992 and  2009   RCA   EMBOLIZATION Right 02/28/2012   Procedure: EMBOLIZATION;  Surgeon: Serafina Mitchell, MD;  Location: [facility] CATH LAB;  Service: Cardiovascular;  Laterality: Right;   IR GENERIC HISTORICAL  12/05/2016   IR RADIOLOGIST EVAL & MGMT 12/05/2016 Jacqulynn Cadet, MD [facility] INTERV RAD   IR RADIOLOGIST EVAL & MGMT  07/10/2019   IR RADIOLOGIST EVAL & MGMT  08/25/2020   IR RADIOLOGIST EVAL & MGMT  08/16/2021   KIDNEY STONE SURGERY     LEFT HEART CATHETERIZATION WITH CORONARY/GRAFT ANGIOGRAM N/A 01/08/2015   Procedure: LEFT HEART CATHETERIZATION WITH Beatrix Fetters;  Surgeon: Jacolyn Reedy, MD;  Location: [facility] CATH LAB;  Service: Cardiovascular;  Laterality: N/A;   parathyroid adenoma     PROSTATE SURGERY     rad seeds   SPINE SURGERY     TONSILLECTOMY      MEDICATIONS:  ACCU-CHEK GUIDE test strip   acetaminophen (TYLENOL) 650 MG CR tablet   allopurinol (ZYLOPRIM) 300 MG tablet   amLODipine (NORVASC) 10 MG tablet   Cholecalciferol (VITAMIN D3) 2000 UNITS TABS   clonazePAM (KLONOPIN) 1 MG tablet   clopidogrel (PLAVIX) 75 MG  tablet   Coenzyme Q10 300 MG CAPS   Cyanocobalamin (VITAMIN B-12 PO)   ELIQUIS 5 MG TABS tablet   esomeprazole (NEXIUM) 20 MG capsule   ezetimibe (ZETIA) 10 MG tablet   FARXIGA 10 MG TABS tablet   fenofibrate micronized (LOFIBRA) 200 MG capsule   fluorouracil (EFUDEX) 5 % cream   fluticasone (FLONASE) 50 MCG/ACT nasal spray   glipiZIDE (GLUCOTROL) 5 MG tablet   levothyroxine (SYNTHROID, LEVOTHROID) 175 MCG tablet   lidocaine (ASPERCREME LIDOCAINE) 4 %   loratadine (CLARITIN) 10 MG tablet   metoprolol tartrate (LOPRESSOR) 25 MG tablet   Multiple Vitamins-Minerals (PRESERVISION AREDS 2) CAPS   olmesartan-hydrochlorothiazide (BENICAR HCT) 40-25 MG tablet   pantoprazole (PROTONIX) 40 MG tablet   Polyethyl Glycol-Propyl  Glycol (SYSTANE OP)   rosuvastatin (CRESTOR) 10 MG tablet   Tamsulosin HCl (FLOMAX) 0.4 MG CAPS   triamcinolone cream (KENALOG) 0.1 %   TURMERIC PO   VASCEPA 1 g capsule   No current facility-administered medications for this encounter.    Myra Gianotti, PA-C Surgical Short Stay/Anesthesiology [facility] Phone [phone] [facility] Phone [phone] 06/14/2022 10:40 AM

## 2022-06-14 NOTE — Anesthesia Preprocedure Evaluation (Signed)
Anesthesia Evaluation    Airway        Dental   Pulmonary former smoker,           Cardiovascular hypertension,   Echo 05/31/22: IMPRESSIONS  1. Left ventricular ejection fraction, by estimation, is 55 to 60%. The  left ventricle has normal function. The left ventricle has no regional  wall motion abnormalities. There is mild left ventricular hypertrophy.  Left ventricular diastolic parameters  are indeterminate.  2. Right ventricular systolic function is mildly reduced. The right  ventricular size is mildly enlarged. There is normal pulmonary artery  systolic pressure.  3. Left atrial size was moderately dilated.  4. Right atrial size was moderately dilated.  5. The mitral valve is normal in structure. Trivial mitral valve  regurgitation.  6. Tricuspid valve regurgitation is mild to moderate.  7. The aortic valve is tricuspid. Aortic valve regurgitation is not  visualized.  8. Small ascending aortic aneurysm 4.3 cm. Aortic dilatation noted.  9. The inferior vena cava is normal in size with greater than 50%  respiratory variability, suggesting right atrial pressure of 3 mmHg.    Nuclear stress test 05/31/22: . The study is normal. The study is low risk. . No ST deviation was noted. . Left ventricular function is normal. Nuclear stress EF: 61 %. The left ventricular ejection fraction is normal (55-65%). End diastolic cavity size is normal. . Prior study available for comparison from 03/10/2020.     Neuro/Psych    GI/Hepatic   Endo/Other  diabetes  Renal/GU      Musculoskeletal   Abdominal   Peds  Hematology   Anesthesia Other Findings   Reproductive/Obstetrics                             Anesthesia Physical Anesthesia Plan  ASA:   Anesthesia Plan:    Post-op Pain Management:    Induction:   PONV Risk Score and Plan:   Airway Management Planned:   Additional  Equipment:   Intra-op Plan:   Post-operative Plan:   Informed Consent:   Plan Discussed with:   Anesthesia Plan Comments: (PAT note written 06/14/2022 by Myra Gianotti, PA-C. )        Anesthesia Quick Evaluation

## 2022-06-15 ENCOUNTER — Encounter (HOSPITAL_COMMUNITY)
Admission: EM | Disposition: A | Discharge status: Home / Self Care | Payer: Self-pay | Source: Home / Self Care | Attending: Cardiovascular Disease

## 2022-06-15 ENCOUNTER — Emergency Department (HOSPITAL_COMMUNITY): Payer: Medicare Other

## 2022-06-15 ENCOUNTER — Other Ambulatory Visit: Payer: Self-pay

## 2022-06-15 ENCOUNTER — Inpatient Hospital Stay (HOSPITAL_COMMUNITY)
Admission: EM | Admit: 2022-06-15 | Discharge: 2022-06-18 | DRG: 251 | Disposition: A | Discharge status: Home / Self Care | Payer: Medicare Other | Attending: Cardiology | Admitting: Cardiology

## 2022-06-15 ENCOUNTER — Inpatient Hospital Stay (HOSPITAL_COMMUNITY): Payer: Medicare Other

## 2022-06-15 ENCOUNTER — Encounter (HOSPITAL_COMMUNITY): Payer: Self-pay | Admitting: Cardiovascular Disease

## 2022-06-15 ENCOUNTER — Other Ambulatory Visit (HOSPITAL_COMMUNITY): Payer: Self-pay

## 2022-06-15 DIAGNOSIS — I2119 ST elevation (STEMI) myocardial infarction involving other coronary artery of inferior wall: Secondary | ICD-10-CM | POA: Diagnosis not present

## 2022-06-15 DIAGNOSIS — I213 ST elevation (STEMI) myocardial infarction of unspecified site: Secondary | ICD-10-CM

## 2022-06-15 DIAGNOSIS — N183 Chronic kidney disease, stage 3 unspecified: Secondary | ICD-10-CM

## 2022-06-15 DIAGNOSIS — E782 Mixed hyperlipidemia: Secondary | ICD-10-CM | POA: Diagnosis present

## 2022-06-15 DIAGNOSIS — Z7982 Long term (current) use of aspirin: Secondary | ICD-10-CM | POA: Diagnosis not present

## 2022-06-15 DIAGNOSIS — K219 Gastro-esophageal reflux disease without esophagitis: Secondary | ICD-10-CM | POA: Diagnosis present

## 2022-06-15 DIAGNOSIS — I1310 Hypertensive heart and chronic kidney disease without heart failure, with stage 1 through stage 4 chronic kidney disease, or unspecified chronic kidney disease: Secondary | ICD-10-CM | POA: Diagnosis present

## 2022-06-15 DIAGNOSIS — I25810 Atherosclerosis of coronary artery bypass graft(s) without angina pectoris: Secondary | ICD-10-CM | POA: Diagnosis present

## 2022-06-15 DIAGNOSIS — Z8679 Personal history of other diseases of the circulatory system: Secondary | ICD-10-CM

## 2022-06-15 DIAGNOSIS — Z7901 Long term (current) use of anticoagulants: Secondary | ICD-10-CM | POA: Diagnosis not present

## 2022-06-15 DIAGNOSIS — Z8616 Personal history of COVID-19: Secondary | ICD-10-CM | POA: Diagnosis not present

## 2022-06-15 DIAGNOSIS — N179 Acute kidney failure, unspecified: Secondary | ICD-10-CM | POA: Diagnosis not present

## 2022-06-15 DIAGNOSIS — I4510 Unspecified right bundle-branch block: Secondary | ICD-10-CM | POA: Diagnosis present

## 2022-06-15 DIAGNOSIS — Z87891 Personal history of nicotine dependence: Secondary | ICD-10-CM

## 2022-06-15 DIAGNOSIS — Z79899 Other long term (current) drug therapy: Secondary | ICD-10-CM | POA: Diagnosis not present

## 2022-06-15 DIAGNOSIS — E1141 Type 2 diabetes mellitus with diabetic mononeuropathy: Secondary | ICD-10-CM | POA: Diagnosis not present

## 2022-06-15 DIAGNOSIS — Z7902 Long term (current) use of antithrombotics/antiplatelets: Secondary | ICD-10-CM | POA: Diagnosis not present

## 2022-06-15 DIAGNOSIS — I119 Hypertensive heart disease without heart failure: Secondary | ICD-10-CM | POA: Diagnosis present

## 2022-06-15 DIAGNOSIS — I480 Paroxysmal atrial fibrillation: Secondary | ICD-10-CM | POA: Diagnosis present

## 2022-06-15 DIAGNOSIS — I071 Rheumatic tricuspid insufficiency: Secondary | ICD-10-CM | POA: Diagnosis not present

## 2022-06-15 DIAGNOSIS — I472 Ventricular tachycardia, unspecified: Secondary | ICD-10-CM | POA: Diagnosis not present

## 2022-06-15 DIAGNOSIS — E039 Hypothyroidism, unspecified: Secondary | ICD-10-CM | POA: Diagnosis not present

## 2022-06-15 DIAGNOSIS — I2510 Atherosclerotic heart disease of native coronary artery without angina pectoris: Secondary | ICD-10-CM | POA: Diagnosis present

## 2022-06-15 DIAGNOSIS — I2111 ST elevation (STEMI) myocardial infarction involving right coronary artery: Principal | ICD-10-CM

## 2022-06-15 DIAGNOSIS — Z8249 Family history of ischemic heart disease and other diseases of the circulatory system: Secondary | ICD-10-CM

## 2022-06-15 DIAGNOSIS — Z955 Presence of coronary angioplasty implant and graft: Secondary | ICD-10-CM

## 2022-06-15 DIAGNOSIS — E1122 Type 2 diabetes mellitus with diabetic chronic kidney disease: Secondary | ICD-10-CM | POA: Diagnosis present

## 2022-06-15 DIAGNOSIS — Z87442 Personal history of urinary calculi: Secondary | ICD-10-CM

## 2022-06-15 DIAGNOSIS — R079 Chest pain, unspecified: Secondary | ICD-10-CM | POA: Diagnosis not present

## 2022-06-15 DIAGNOSIS — Z833 Family history of diabetes mellitus: Secondary | ICD-10-CM | POA: Diagnosis not present

## 2022-06-15 DIAGNOSIS — M4316 Spondylolisthesis, lumbar region: Secondary | ICD-10-CM | POA: Diagnosis present

## 2022-06-15 DIAGNOSIS — Z01812 Encounter for preprocedural laboratory examination: Secondary | ICD-10-CM

## 2022-06-15 DIAGNOSIS — Z823 Family history of stroke: Secondary | ICD-10-CM

## 2022-06-15 DIAGNOSIS — I252 Old myocardial infarction: Secondary | ICD-10-CM

## 2022-06-15 DIAGNOSIS — I2121 ST elevation (STEMI) myocardial infarction involving left circumflex coronary artery: Secondary | ICD-10-CM | POA: Diagnosis not present

## 2022-06-15 DIAGNOSIS — Z79890 Hormone replacement therapy (postmenopausal): Secondary | ICD-10-CM

## 2022-06-15 DIAGNOSIS — M109 Gout, unspecified: Secondary | ICD-10-CM | POA: Diagnosis present

## 2022-06-15 DIAGNOSIS — N1832 Chronic kidney disease, stage 3b: Secondary | ICD-10-CM | POA: Diagnosis not present

## 2022-06-15 DIAGNOSIS — E785 Hyperlipidemia, unspecified: Secondary | ICD-10-CM | POA: Diagnosis present

## 2022-06-15 DIAGNOSIS — Z7984 Long term (current) use of oral hypoglycemic drugs: Secondary | ICD-10-CM

## 2022-06-15 DIAGNOSIS — Z9862 Peripheral vascular angioplasty status: Secondary | ICD-10-CM

## 2022-06-15 DIAGNOSIS — Z8546 Personal history of malignant neoplasm of prostate: Secondary | ICD-10-CM

## 2022-06-15 HISTORY — PX: LEFT HEART CATH AND CORONARY ANGIOGRAPHY: CATH118249

## 2022-06-15 HISTORY — PX: CORONARY BALLOON ANGIOPLASTY: CATH118233

## 2022-06-15 LAB — COMPREHENSIVE METABOLIC PANEL
ALT: 27 U/L (ref 0–44)
AST: 22 U/L (ref 15–41)
Albumin: 3.8 g/dL (ref 3.5–5.0)
Alkaline Phosphatase: 56 U/L (ref 38–126)
Anion gap: 12 (ref 5–15)
BUN: 36 mg/dL — ABNORMAL HIGH (ref 8–23)
CO2: 23 mmol/L (ref 22–32)
Calcium: 10.4 mg/dL — ABNORMAL HIGH (ref 8.9–10.3)
Chloride: 101 mmol/L (ref 98–111)
Creatinine, Ser: 1.91 mg/dL — ABNORMAL HIGH (ref 0.61–1.24)
GFR, Estimated: 35 mL/min — ABNORMAL LOW (ref >60)
Glucose, Bld: 201 mg/dL — ABNORMAL HIGH (ref 70–99)
Potassium: 3.7 mmol/L (ref 3.5–5.1)
Sodium: 136 mmol/L (ref 135–145)
Total Bilirubin: 0.9 mg/dL (ref 0.3–1.2)
Total Protein: 6.6 g/dL (ref 6.5–8.1)

## 2022-06-15 LAB — CBC WITH DIFFERENTIAL/PLATELET
Abs Immature Granulocytes: 0.06 10*3/uL (ref 0.00–0.07)
Basophils Absolute: 0 10*3/uL (ref 0.0–0.1)
Basophils Relative: 1 %
Eosinophils Absolute: 0.1 10*3/uL (ref 0.0–0.5)
Eosinophils Relative: 2 %
HCT: 45.6 % (ref 39.0–52.0)
Hemoglobin: 16.1 g/dL (ref 13.0–17.0)
Immature Granulocytes: 1 %
Lymphocytes Relative: 21 %
Lymphs Abs: 1.2 10*3/uL (ref 0.7–4.0)
MCH: 31.6 pg (ref 26.0–34.0)
MCHC: 35.3 g/dL (ref 30.0–36.0)
MCV: 89.4 fL (ref 80.0–100.0)
Monocytes Absolute: 0.6 10*3/uL (ref 0.1–1.0)
Monocytes Relative: 10 %
Neutro Abs: 3.9 10*3/uL (ref 1.7–7.7)
Neutrophils Relative %: 65 %
Platelets: 159 10*3/uL (ref 150–400)
RBC: 5.1 MIL/uL (ref 4.22–5.81)
RDW: 13.1 % (ref 11.5–15.5)
WBC: 5.9 10*3/uL (ref 4.0–10.5)
nRBC: 0 % (ref 0.0–0.2)

## 2022-06-15 LAB — GLUCOSE, CAPILLARY
Glucose-Capillary: 177 mg/dL — ABNORMAL HIGH (ref 70–99)
Glucose-Capillary: 130 mg/dL — ABNORMAL HIGH (ref 70–99)
Glucose-Capillary: 108 mg/dL — ABNORMAL HIGH (ref 70–99)

## 2022-06-15 LAB — MRSA NEXT GEN BY PCR, NASAL: MRSA by PCR Next Gen: NOT DETECTED

## 2022-06-15 LAB — RESP PANEL BY RT-PCR (FLU A&B, COVID) ARPGX2
Influenza A by PCR: NEGATIVE
Influenza B by PCR: NEGATIVE
SARS Coronavirus 2 by RT PCR: NEGATIVE

## 2022-06-15 LAB — HEMOGLOBIN A1C
Hgb A1c MFr Bld: 6.7 % — ABNORMAL HIGH (ref 4.8–5.6)
Mean Plasma Glucose: 145.59 mg/dL

## 2022-06-15 LAB — PROTIME-INR
INR: 1.2 (ref 0.8–1.2)
Prothrombin Time: 14.9 seconds (ref 11.4–15.2)

## 2022-06-15 LAB — LIPID PANEL
Cholesterol: 149 mg/dL (ref 0–200)
HDL: 30 mg/dL — ABNORMAL LOW (ref >40)
LDL Cholesterol: 45 mg/dL (ref 0–99)
Total CHOL/HDL Ratio: 5 RATIO
Triglycerides: 372 mg/dL — ABNORMAL HIGH (ref <150)
VLDL: 74 mg/dL — ABNORMAL HIGH (ref 0–40)

## 2022-06-15 LAB — ECHOCARDIOGRAM COMPLETE: S' Lateral: 3.9 cm

## 2022-06-15 LAB — POCT ACTIVATED CLOTTING TIME: Activated Clotting Time: 239 seconds

## 2022-06-15 LAB — APTT: aPTT: 27 seconds (ref 24–36)

## 2022-06-15 LAB — TROPONIN I (HIGH SENSITIVITY)
Troponin I (High Sensitivity): 8 ng/L (ref <18)
Troponin I (High Sensitivity): 661 ng/L (ref <18)

## 2022-06-15 SURGERY — LEFT HEART CATH AND CORONARY ANGIOGRAPHY
Anesthesia: LOCAL

## 2022-06-15 MED ORDER — DAPAGLIFLOZIN PROPANEDIOL 10 MG PO TABS
10.0000 mg | ORAL_TABLET | Freq: Every day | ORAL | Status: DC
  Administered 2022-06-15 – 2022-06-17 (×3): 10 mg via ORAL
  Filled 2022-06-15 (×4): qty 1

## 2022-06-15 MED ORDER — VERAPAMIL HCL 2.5 MG/ML IV SOLN
INTRAVENOUS | Status: DC | PRN
  Administered 2022-06-15: 10 mL via INTRA_ARTERIAL

## 2022-06-15 MED ORDER — CLONAZEPAM 0.5 MG PO TABS
0.5000 mg | ORAL_TABLET | Freq: Every day | ORAL | Status: DC
Start: 2022-06-15 — End: 2022-06-18
  Administered 2022-06-15 – 2022-06-17 (×3): 0.5 mg via ORAL
  Filled 2022-06-15 (×3): qty 1

## 2022-06-15 MED ORDER — ICOSAPENT ETHYL 1 G PO CAPS
2.0000 g | ORAL_CAPSULE | Freq: Two times a day (BID) | ORAL | Status: DC
  Administered 2022-06-15 – 2022-06-18 (×7): 2 g via ORAL
  Filled 2022-06-15 (×9): qty 2

## 2022-06-15 MED ORDER — MIDAZOLAM HCL 2 MG/2ML IJ SOLN
INTRAMUSCULAR | Status: AC
  Filled 2022-06-15: qty 2

## 2022-06-15 MED ORDER — MIDAZOLAM HCL 2 MG/2ML IJ SOLN
INTRAMUSCULAR | Status: DC | PRN
  Administered 2022-06-15: 1 mg via INTRAVENOUS
  Administered 2022-06-15: 2 mg via INTRAVENOUS

## 2022-06-15 MED ORDER — FENOFIBRATE 160 MG PO TABS
160.0000 mg | ORAL_TABLET | Freq: Every day | ORAL | Status: DC
  Administered 2022-06-15 – 2022-06-18 (×4): 160 mg via ORAL
  Filled 2022-06-15 (×4): qty 1

## 2022-06-15 MED ORDER — IOHEXOL 350 MG/ML SOLN
INTRAVENOUS | Status: DC | PRN
  Administered 2022-06-15: 120 mL

## 2022-06-15 MED ORDER — SODIUM CHLORIDE 0.9% FLUSH: 3.0000 mL | INTRAVENOUS | Status: DC | PRN

## 2022-06-15 MED ORDER — METOPROLOL TARTRATE 25 MG PO TABS
25.0000 mg | ORAL_TABLET | Freq: Two times a day (BID) | ORAL | Status: DC
  Administered 2022-06-15 – 2022-06-18 (×7): 25 mg via ORAL
  Filled 2022-06-15 (×7): qty 1

## 2022-06-15 MED ORDER — HEPARIN SODIUM (PORCINE) 1000 UNIT/ML IJ SOLN
INTRAMUSCULAR | Status: AC
  Filled 2022-06-15: qty 10

## 2022-06-15 MED ORDER — SODIUM CHLORIDE 0.9 % IV SOLN: INTRAVENOUS | Status: DC

## 2022-06-15 MED ORDER — SODIUM CHLORIDE 0.9 % WEIGHT BASED INFUSION
1.0000 mL/kg/h | INTRAVENOUS | Status: AC
Start: 2022-06-15 — End: 2022-06-15
  Administered 2022-06-15: 1 mL/kg/h via INTRAVENOUS

## 2022-06-15 MED ORDER — ONDANSETRON HCL 4 MG/2ML IJ SOLN: 4.0000 mg | Freq: Four times a day (QID) | INTRAMUSCULAR | Status: DC | PRN

## 2022-06-15 MED ORDER — ROSUVASTATIN CALCIUM 5 MG PO TABS: 5.0000 mg | ORAL_TABLET | ORAL | Status: DC

## 2022-06-15 MED ORDER — SODIUM CHLORIDE 0.9 % IV BOLUS
INTRAVENOUS | Status: AC | PRN
  Administered 2022-06-15 (×2): 250 mL via INTRAVENOUS

## 2022-06-15 MED ORDER — HEPARIN (PORCINE) IN NACL 1000-0.9 UT/500ML-% IV SOLN
INTRAVENOUS | Status: DC | PRN
  Administered 2022-06-15 (×2): 500 mL

## 2022-06-15 MED ORDER — FENTANYL CITRATE (PF) 100 MCG/2ML IJ SOLN
INTRAMUSCULAR | Status: AC
  Filled 2022-06-15: qty 2

## 2022-06-15 MED ORDER — ASPIRIN 81 MG PO CHEW
81.0000 mg | CHEWABLE_TABLET | Freq: Every day | ORAL | Status: DC
  Administered 2022-06-16 – 2022-06-18 (×3): 81 mg via ORAL
  Filled 2022-06-15 (×3): qty 1

## 2022-06-15 MED ORDER — ORAL CARE MOUTH RINSE: 15.0000 mL | OROMUCOSAL | Status: DC | PRN

## 2022-06-15 MED ORDER — HEPARIN (PORCINE) IN NACL 1000-0.9 UT/500ML-% IV SOLN
INTRAVENOUS | Status: AC
  Filled 2022-06-15: qty 1000

## 2022-06-15 MED ORDER — FENOFIBRATE 200 MG PO CAPS: 200.0000 mg | ORAL_CAPSULE | Freq: Every day | ORAL | Status: DC

## 2022-06-15 MED ORDER — CLOPIDOGREL BISULFATE 75 MG PO TABS
75.0000 mg | ORAL_TABLET | Freq: Every day | ORAL | Status: DC
  Administered 2022-06-16 – 2022-06-18 (×3): 75 mg via ORAL
  Filled 2022-06-15 (×3): qty 1

## 2022-06-15 MED ORDER — LABETALOL HCL 5 MG/ML IV SOLN: 10.0000 mg | INTRAVENOUS | Status: AC | PRN

## 2022-06-15 MED ORDER — ASPIRIN 81 MG PO CHEW
324.0000 mg | CHEWABLE_TABLET | Freq: Once | ORAL | Status: AC
Start: 2022-06-15 — End: 2022-06-15
  Administered 2022-06-15: 324 mg via ORAL

## 2022-06-15 MED ORDER — VERAPAMIL HCL 2.5 MG/ML IV SOLN
INTRAVENOUS | Status: AC
  Filled 2022-06-15: qty 2

## 2022-06-15 MED ORDER — INSULIN ASPART 100 UNIT/ML IJ SOLN
0.0000 [IU] | Freq: Three times a day (TID) | INTRAMUSCULAR | Status: DC
  Administered 2022-06-15 – 2022-06-16 (×2): 2 [IU] via SUBCUTANEOUS
  Administered 2022-06-16: 3 [IU] via SUBCUTANEOUS
  Administered 2022-06-17 (×2): 2 [IU] via SUBCUTANEOUS
  Administered 2022-06-17: 5 [IU] via SUBCUTANEOUS
  Administered 2022-06-18: 3 [IU] via SUBCUTANEOUS

## 2022-06-15 MED ORDER — EZETIMIBE 10 MG PO TABS
10.0000 mg | ORAL_TABLET | Freq: Every day | ORAL | Status: DC
  Administered 2022-06-15 – 2022-06-18 (×4): 10 mg via ORAL
  Filled 2022-06-15 (×4): qty 1

## 2022-06-15 MED ORDER — ACETAMINOPHEN 325 MG PO TABS: 650.0000 mg | ORAL_TABLET | ORAL | Status: DC | PRN

## 2022-06-15 MED ORDER — HEPARIN SODIUM (PORCINE) 5000 UNIT/ML IJ SOLN
4000.0000 [IU] | Freq: Once | INTRAMUSCULAR | Status: AC
  Administered 2022-06-15: 4000 [IU] via INTRAVENOUS

## 2022-06-15 MED ORDER — SODIUM CHLORIDE 0.9% FLUSH
3.0000 mL | Freq: Two times a day (BID) | INTRAVENOUS | Status: DC
  Administered 2022-06-16 – 2022-06-17 (×4): 3 mL via INTRAVENOUS

## 2022-06-15 MED ORDER — LIDOCAINE HCL (PF) 1 % IJ SOLN
INTRAMUSCULAR | Status: DC | PRN
  Administered 2022-06-15: 2 mL

## 2022-06-15 MED ORDER — HYDRALAZINE HCL 20 MG/ML IJ SOLN: 10.0000 mg | INTRAMUSCULAR | Status: AC | PRN

## 2022-06-15 MED ORDER — HEPARIN SODIUM (PORCINE) 1000 UNIT/ML IJ SOLN
INTRAMUSCULAR | Status: DC | PRN
  Administered 2022-06-15: 5000 [IU] via INTRAVENOUS
  Administered 2022-06-15: 4000 [IU] via INTRAVENOUS

## 2022-06-15 MED ORDER — ALLOPURINOL 300 MG PO TABS
300.0000 mg | ORAL_TABLET | ORAL | Status: DC
  Administered 2022-06-15 – 2022-06-17 (×2): 300 mg via ORAL
  Filled 2022-06-15 (×3): qty 1

## 2022-06-15 MED ORDER — CLOPIDOGREL BISULFATE 300 MG PO TABS
ORAL_TABLET | ORAL | Status: DC | PRN
  Administered 2022-06-15: 600 mg via ORAL

## 2022-06-15 MED ORDER — NITROGLYCERIN 1 MG/10 ML FOR IR/CATH LAB
INTRA_ARTERIAL | Status: AC
  Filled 2022-06-15: qty 10

## 2022-06-15 MED ORDER — LIDOCAINE HCL (PF) 1 % IJ SOLN
INTRAMUSCULAR | Status: AC
  Filled 2022-06-15: qty 30

## 2022-06-15 MED ORDER — APIXABAN 5 MG PO TABS
5.0000 mg | ORAL_TABLET | Freq: Two times a day (BID) | ORAL | Status: DC
  Administered 2022-06-16 – 2022-06-18 (×5): 5 mg via ORAL
  Filled 2022-06-15 (×5): qty 1

## 2022-06-15 MED ORDER — SODIUM CHLORIDE 0.9 % IV SOLN
250.0000 mL | INTRAVENOUS | Status: DC | PRN
Start: 2022-06-15 — End: 2022-06-18

## 2022-06-15 MED ORDER — CLOPIDOGREL BISULFATE 300 MG PO TABS
ORAL_TABLET | ORAL | Status: AC
  Filled 2022-06-15: qty 2

## 2022-06-15 MED ORDER — FENTANYL CITRATE (PF) 100 MCG/2ML IJ SOLN
INTRAMUSCULAR | Status: DC | PRN
  Administered 2022-06-15 (×2): 25 ug via INTRAVENOUS

## 2022-06-15 MED ORDER — LEVOTHYROXINE SODIUM 75 MCG PO TABS
175.0000 ug | ORAL_TABLET | Freq: Every day | ORAL | Status: DC
  Administered 2022-06-16 – 2022-06-18 (×3): 175 ug via ORAL
  Filled 2022-06-15 (×3): qty 1

## 2022-06-15 MED ORDER — VERAPAMIL HCL 2.5 MG/ML IV SOLN
INTRAVENOUS | Status: DC | PRN
  Administered 2022-06-15 (×4): 200 ug via INTRACORONARY
  Administered 2022-06-15: 100 ug via INTRACORONARY

## 2022-06-15 SURGICAL SUPPLY — 19 items
BALLN SAPPHIRE 2.0X15 (BALLOONS) ×2
BALLOON SAPPHIRE 2.0X15 (BALLOONS) IMPLANT
CATH 5FR JL3.5 JR4 ANG PIG MP (CATHETERS) ×1 IMPLANT
CATH EXPO 5F MPA-1 (CATHETERS) ×1 IMPLANT
CATH INFINITI 5FR JL5 (CATHETERS) ×1 IMPLANT
CATH LAUNCHER 6FR AL2 (CATHETERS) IMPLANT
CATHETER LAUNCHER 6FR AL2 (CATHETERS) ×2
DEVICE RAD COMP TR BAND LRG (VASCULAR PRODUCTS) ×1 IMPLANT
ELECT DEFIB PAD ADLT CADENCE (PAD) ×1 IMPLANT
GLIDESHEATH SLEND SS 6F .021 (SHEATH) ×1 IMPLANT
GUIDEWIRE INQWIRE 1.5J.035X260 (WIRE) IMPLANT
INQWIRE 1.5J .035X260CM (WIRE) ×2
KIT ENCORE 26 ADVANTAGE (KITS) ×1 IMPLANT
KIT HEART LEFT (KITS) ×2 IMPLANT
PACK CARDIAC CATHETERIZATION (CUSTOM PROCEDURE TRAY) ×2 IMPLANT
TRANSDUCER W/STOPCOCK (MISCELLANEOUS) ×2 IMPLANT
TUBING CIL FLEX 10 FLL-RA (TUBING) ×2 IMPLANT
WIRE COUGAR XT STRL 190CM (WIRE) ×1 IMPLANT
WIRE HI TORQ WHISPER MS 190CM (WIRE) ×1 IMPLANT

## 2022-06-15 NOTE — ED Provider Notes (Signed)
[name] Provider Note   CSN: [id number] Arrival date & time: 06/15/22  5284     History  Chief Complaint  Patient presents with   Chest Pain    Garrett Jordan. is a 81 y.o. male with a past medical history of coronary disease status post 5 vessel CABG, what has really on Eliquis and clopidogrel who presents to the emergency department with chest tightness since this morning.  Patient states that the pain is nonradiating and is associated with mild diaphoresis.  Nonexertional.  States that it has not been as severe as prior myocardial infarction.  Patient took antacid without improvement in symptoms.  He presented to the emergency department for evaluation.  Patient is not taking nitroglycerin.  No recent illnesses.   Chest Pain Associated symptoms: no abdominal pain, no back pain, no cough, no fever, no palpitations, no shortness of breath and no vomiting        Home Medications Prior to Admission medications   Medication Sig Start Date End Date Taking? Authorizing Provider  ACCU-CHEK GUIDE test strip USE TWICE DAILY 05/29/22   Shamleffer, Melanie Crazier, MD  acetaminophen (TYLENOL) 650 MG CR tablet 650 mg every 8 (eight) hours as needed for pain.    [provider]  allopurinol (ZYLOPRIM) 300 MG tablet Take 300 mg by mouth every other day. Take in the evening - on even days of the month    [provider]  amLODipine (NORVASC) 10 MG tablet TAKE 1 TABLET BY MOUTH  DAILY 08/08/21   Jerline Pain, MD  Cholecalciferol (VITAMIN D3) 2000 UNITS TABS Take 2,000 Units by mouth daily.     [provider]  clonazePAM (KLONOPIN) 1 MG tablet Take 0.5 mg by mouth at bedtime.    [provider]  clopidogrel (PLAVIX) 75 MG tablet TAKE 1 TABLET BY MOUTH  DAILY 08/08/21   Jerline Pain, MD  Coenzyme Q10 300 MG CAPS Take 300 mg by mouth daily.    [provider]  Cyanocobalamin (VITAMIN B-12 PO) Take 2,500 mcg  by mouth daily.    [provider]  ELIQUIS 5 MG TABS tablet TAKE 1 TABLET BY MOUTH TWICE A DAY 01/04/22   Jerline Pain, MD  esomeprazole (NEXIUM) 20 MG capsule Take 20 mg by mouth at bedtime.    [provider]  ezetimibe (ZETIA) 10 MG tablet TAKE 1 TABLET BY MOUTH  DAILY 06/12/22   Early Osmond, MD  FARXIGA 10 MG TABS tablet Take 10 mg by mouth daily. 12/13/20   [provider]  fenofibrate micronized (LOFIBRA) 200 MG capsule Take 1 capsule (200 mg total) by mouth daily before breakfast. 03/30/22   Shamleffer, Melanie Crazier, MD  fluorouracil (EFUDEX) 5 % cream Apply 1 Application topically daily as needed (cancer spots). Use as directed    [provider]  fluticasone (FLONASE) 50 MCG/ACT nasal spray Place 2 sprays into both nostrils daily as needed for allergies.    [provider]  glipiZIDE (GLUCOTROL) 5 MG tablet Take 0.5 tablets (2.5 mg total) by mouth daily before breakfast AND 1 tablet (5 mg total) daily before supper. 03/30/22   Shamleffer, Melanie Crazier, MD  levothyroxine (SYNTHROID, LEVOTHROID) 175 MCG tablet Take 175 mcg by mouth daily before breakfast.    [provider]  lidocaine (ASPERCREME LIDOCAINE) 4 % Place 1 patch onto the skin daily as needed (pain).    [provider]  loratadine (CLARITIN) 10 MG  tablet Take 10 mg by mouth at bedtime.    [provider]  metoprolol tartrate (LOPRESSOR) 25 MG tablet TAKE 1 TABLET BY MOUTH  TWICE DAILY 03/07/22   Jerline Pain, MD  Multiple Vitamins-Minerals (PRESERVISION AREDS 2) CAPS Take 1 capsule by mouth 2 (two) times daily.    [provider]  olmesartan-hydrochlorothiazide (BENICAR HCT) 40-25 MG tablet TAKE 1 TABLET BY MOUTH  DAILY 05/04/22   Jerline Pain, MD  pantoprazole (PROTONIX) 40 MG tablet TAKE 1 TABLET BY MOUTH  DAILY Patient not taking: Reported on 06/06/2022 11/25/21   Jerline Pain, MD  Polyethyl Glycol-Propyl Glycol (SYSTANE OP) Place 1 drop  into both eyes daily as needed (dry/irritated eyes).    [provider]  rosuvastatin (CRESTOR) 10 MG tablet Take 5 mg by mouth 2 (two) times a week. Mondays & Thursdays    [provider]  Tamsulosin HCl (FLOMAX) 0.4 MG CAPS Take 0.4 mg by mouth every other day. Take in the evening - on even days of the month    [provider]  triamcinolone cream (KENALOG) 0.1 % Apply 1 application  topically daily as needed (rash). 02/07/18   [provider]  TURMERIC PO Take 2 tablets by mouth in the morning and at bedtime.    [provider]  VASCEPA 1 g capsule TAKE 2 CAPSULES BY MOUTH 2 TIMES DAILY. 04/13/22   Jerline Pain, MD      Allergies    Ace inhibitors, Crestor [rosuvastatin], Nitroglycerin, and Quinolones    Review of Systems   Review of Systems  Constitutional:  Negative for chills and fever.  HENT:  Negative for ear pain and sore throat.   Eyes:  Negative for pain and visual disturbance.  Respiratory:  Negative for cough and shortness of breath.   Cardiovascular:  Positive for chest pain. Negative for palpitations.  Gastrointestinal:  Negative for abdominal pain and vomiting.  Genitourinary:  Negative for dysuria and hematuria.  Musculoskeletal:  Negative for arthralgias and back pain.  Skin:  Negative for color change and rash.  Neurological:  Negative for seizures and syncope.  All other systems reviewed and are negative.   Physical Exam Updated Vital Signs BP (!) 137/90 (BP Location: Right Arm)   Pulse 73   Temp 97.6 F (36.4 C) (Oral)   Resp 18   SpO2 97%  Physical Exam Vitals and nursing note reviewed.  Constitutional:      General: He is not in acute distress.    Appearance: He is well-developed.     Comments: Upon entering the exam room, the patient is lying in bed awake and alert.  He is in no acute distress.  HENT:     Head: Normocephalic and atraumatic.  Eyes:     Conjunctiva/sclera: Conjunctivae normal.   Cardiovascular:     Rate and Rhythm: Normal rate and regular rhythm.     Heart sounds: No murmur heard.    Comments: Regular rate and rhythm no murmurs or gallops Pulmonary:     Effort: Pulmonary effort is normal. No respiratory distress.     Breath sounds: Normal breath sounds.     Comments: Saturating well on room air, nontachypneic.  Speaking in full sentences without difficulty. Abdominal:     Palpations: Abdomen is soft.     Tenderness: There is no abdominal tenderness.  Musculoskeletal:        General: No swelling.     Cervical back: Neck supple.  Skin:  General: Skin is warm and dry.     Capillary Refill: Capillary refill takes less than 2 seconds.  Neurological:     Mental Status: He is alert.     Comments: Fully alert and oriented moving all extremity spontaneously grossly neurologically intact  Psychiatric:        Mood and Affect: Mood normal.     ED Results / Procedures / Treatments   Labs (all labs ordered are listed, but only abnormal results are displayed) Labs Reviewed  RESP PANEL BY RT-PCR (FLU A&B, COVID) [id number]  HEMOGLOBIN A1C  CBC WITH DIFFERENTIAL/PLATELET  PROTIME-INR  APTT  COMPREHENSIVE METABOLIC PANEL  LIPID PANEL  TROPONIN I (HIGH SENSITIVITY)    EKG None  Radiology No results found.  Procedures Procedures    Medications Ordered in ED Medications  0.9 %  sodium chloride infusion (has no administration in time range)  aspirin chewable tablet 324 mg (has no administration in time range)    ED Course/ Medical Decision Making/ A&P                           Medical Decision Making Amount and/or Complexity of Data Reviewed Independent Historian: caregiver and EMS ECG/medicine tests: independent interpretation performed. Decision-making details documented in ED Course.  Risk Prescription drug management.   Patient presents to the emergency department hemodynamically stable, afebrile and with chest pressure and diaphoresis since  this morning.  I have personally reviewed interpret the patient's EKG which reveals inferior ST segment elevation consistent with inferior STEMI.  I have personally reviewed the patient's prior EKG which does not reveal above.  Code STEMI activated and Cath Lab activated.  Patient given aspirin and heparin bolus given last Eliquis use greater than 48 hours prior.  Patient was taken to the Cath Lab in hemodynamically stable condition.       Final Clinical Impression(s) / ED Diagnoses Final diagnoses:  ST elevation myocardial infarction involving right coronary artery [facility])    Rx / DC Orders ED Discharge Orders     None         Levie Heritage, MD 06/15/22 3546    Isla Pence, MD 06/15/22 [phone]

## 2022-06-15 NOTE — H&P (Signed)
Cardiology Admission History and Physical:   Patient ID: Garrett Jordan Sr. MRN: [id number]; DOB: 08/22/1941   Admission date: 06/15/2022  PCP:  Antony Contras, MD   [facility] Providers Cardiologist:  Candee Furbish, MD        Chief Complaint: Chest pain  Patient Profile:   Garrett Barham. is a 81 y.o. male with known CAD status post CABG who is being seen 06/15/2022 for the evaluation of chest pain/STEMI.  History of Present Illness:   Garrett Jordan has a history of 5 vessel CABG in 2010 when he was treated with a LIMA to LAD, saphenous vein graft to OM 2, saphenous vein graft to RV marginal branch of the RCA, saphenous vein graft to PDA, and saphenous vein graft to distal posterolateral branch of the RCA.  He has been anticoagulated with apixaban and has been maintained on clopidogrel with history of EVAR and CAD.  Clopidogrel and apixaban have been on hold for back surgery which is scheduled for tomorrow.  The patient woke up this morning with substernal chest pressure and came by private vehicle to the emergency department where his EKG showed his chronic right bundle branch block but new inferior ST segment elevation.  A code STEMI is called.  At the time of my evaluation he is continuing to experience mild substernal and left-sided discomfort.  States that his pain this morning felt like a pressure across the center of his chest as well as pain in the left side that felt like "heartburn."  He took some antacid medication but his symptoms did not improve.  There is no associated dyspnea, diaphoresis, nausea, or vomiting.  He has not experienced pain like this before.  States with his old MI he felt discomfort in his jaw which he is not currently experiencing.  The patient has no other complaints and was in his normal state of health when he went to bed yesterday evening.   Past Medical History:  Diagnosis Date   AAA (abdominal aortic aneurysm) (HCC)    Arthritis    knees   Atrial  fibrillation (HCC)    CAD (coronary artery disease)    Cancer ([place])    prostate   COVID    06/2019 and in 2022 states they were mild cases   Diabetes mellitus without complication ([place])    Dysrhythmia    A-fib pt is on Eliquis   GERD (gastroesophageal reflux disease)    Gout    History of kidney stones    Hyperlipidemia    Hypertension    Hypothyroidism    Myocardial infarction ([place]) 01/1991   sp inferior   Nerve compression    right leg   Reflux    Seasonal allergies    Thoracic ascending aortic aneurysm ([place])    Thyroid disease    hypothyroidism    Past Surgical History:  Procedure Laterality Date   ABDOMINAL AORTAGRAM N/A 02/28/2012   Procedure: ABDOMINAL Maxcine Ham;  Surgeon: Serafina Mitchell, MD;  Location: [facility] CATH LAB;  Service: Cardiovascular;  Laterality: N/A;   abdominal aortagram embolization  02/28/2012   ABDOMINAL AORTIC ANEURYSM REPAIR  Sept. 2013   CORONARY ARTERY BYPASS GRAFT  04/2009   CORONARY STENT PLACEMENT  1992 and  2009   RCA   EMBOLIZATION Right 02/28/2012   Procedure: EMBOLIZATION;  Surgeon: Serafina Mitchell, MD;  Location: [facility] CATH LAB;  Service: Cardiovascular;  Laterality: Right;   IR GENERIC HISTORICAL  12/05/2016   IR RADIOLOGIST EVAL &  MGMT 12/05/2016 Jacqulynn Cadet, MD [facility] INTERV RAD   IR RADIOLOGIST EVAL & MGMT  07/10/2019   IR RADIOLOGIST EVAL & MGMT  08/25/2020   IR RADIOLOGIST EVAL & MGMT  08/16/2021   KIDNEY STONE SURGERY     LEFT HEART CATHETERIZATION WITH CORONARY/GRAFT ANGIOGRAM N/A 01/08/2015   Procedure: LEFT HEART CATHETERIZATION WITH Beatrix Fetters;  Surgeon: Jacolyn Reedy, MD;  Location: [facility] CATH LAB;  Service: Cardiovascular;  Laterality: N/A;   parathyroid adenoma     PROSTATE SURGERY     rad seeds   SPINE SURGERY     TONSILLECTOMY       Medications Prior to Admission: Prior to Admission medications   Medication Sig Start Date End Date Taking? Authorizing Provider  ACCU-CHEK GUIDE test strip USE TWICE DAILY  05/29/22   Shamleffer, Melanie Crazier, MD  acetaminophen (TYLENOL) 650 MG CR tablet 650 mg every 8 (eight) hours as needed for pain.    [provider]  allopurinol (ZYLOPRIM) 300 MG tablet Take 300 mg by mouth every other day. Take in the evening - on even days of the month    [provider]  amLODipine (NORVASC) 10 MG tablet TAKE 1 TABLET BY MOUTH  DAILY 08/08/21   Jerline Pain, MD  Cholecalciferol (VITAMIN D3) 2000 UNITS TABS Take 2,000 Units by mouth daily.     [provider]  clonazePAM (KLONOPIN) 1 MG tablet Take 0.5 mg by mouth at bedtime.    [provider]  clopidogrel (PLAVIX) 75 MG tablet TAKE 1 TABLET BY MOUTH  DAILY 08/08/21   Jerline Pain, MD  Coenzyme Q10 300 MG CAPS Take 300 mg by mouth daily.    [provider]  Cyanocobalamin (VITAMIN B-12 PO) Take 2,500 mcg by mouth daily.    [provider]  ELIQUIS 5 MG TABS tablet TAKE 1 TABLET BY MOUTH TWICE A DAY 01/04/22   Jerline Pain, MD  esomeprazole (NEXIUM) 20 MG capsule Take 20 mg by mouth at bedtime.    [provider]  ezetimibe (ZETIA) 10 MG tablet TAKE 1 TABLET BY MOUTH  DAILY 06/12/22   Early Osmond, MD  FARXIGA 10 MG TABS tablet Take 10 mg by mouth daily. 12/13/20   [provider]  fenofibrate micronized (LOFIBRA) 200 MG capsule Take 1 capsule (200 mg total) by mouth daily before breakfast. 03/30/22   Shamleffer, Melanie Crazier, MD  fluorouracil (EFUDEX) 5 % cream Apply 1 Application topically daily as needed (cancer spots). Use as directed    [provider]  fluticasone (FLONASE) 50 MCG/ACT nasal spray Place 2 sprays into both nostrils daily as needed for allergies.    [provider]  glipiZIDE (GLUCOTROL) 5 MG tablet Take 0.5 tablets (2.5 mg total) by mouth daily before breakfast AND 1 tablet (5 mg total) daily before supper. 03/30/22   Shamleffer, Melanie Crazier, MD  levothyroxine (SYNTHROID, LEVOTHROID) 175 MCG tablet Take 175  mcg by mouth daily before breakfast.    [provider]  lidocaine (ASPERCREME LIDOCAINE) 4 % Place 1 patch onto the skin daily as needed (pain).    [provider]  loratadine (CLARITIN) 10 MG tablet Take 10 mg by mouth at bedtime.    [provider]  metoprolol tartrate (LOPRESSOR) 25 MG tablet TAKE 1 TABLET BY MOUTH  TWICE DAILY 03/07/22   Jerline Pain, MD  Multiple Vitamins-Minerals (PRESERVISION AREDS 2) CAPS Take 1 capsule by mouth 2 (two) times daily.  [provider]  olmesartan-hydrochlorothiazide (BENICAR HCT) 40-25 MG tablet TAKE 1 TABLET BY MOUTH  DAILY 05/04/22   Jerline Pain, MD  pantoprazole (PROTONIX) 40 MG tablet TAKE 1 TABLET BY MOUTH  DAILY Patient not taking: Reported on 06/06/2022 11/25/21   Jerline Pain, MD  Polyethyl Glycol-Propyl Glycol (SYSTANE OP) Place 1 drop into both eyes daily as needed (dry/irritated eyes).    [provider]  rosuvastatin (CRESTOR) 10 MG tablet Take 5 mg by mouth 2 (two) times a week. Mondays & Thursdays    [provider]  Tamsulosin HCl (FLOMAX) 0.4 MG CAPS Take 0.4 mg by mouth every other day. Take in the evening - on even days of the month    [provider]  triamcinolone cream (KENALOG) 0.1 % Apply 1 application  topically daily as needed (rash). 02/07/18   [provider]  TURMERIC PO Take 2 tablets by mouth in the morning and at bedtime.    [provider]  VASCEPA 1 g capsule TAKE 2 CAPSULES BY MOUTH 2 TIMES DAILY. 04/13/22   Jerline Pain, MD     Allergies:    Allergies  Allergen Reactions   Ace Inhibitors Cough   Crestor [Rosuvastatin] Other (See Comments)    Muscle aches   Nitroglycerin Other (See Comments)    Very pronounced lowered BP with IV nitro for caths   Quinolones     Aortic root enlargement    Social History:   Social History   Socioeconomic History   Marital status: Divorced    Spouse name: Not on file   Number of children: Not on  file   Years of education: Not on file   Highest education level: Not on file  Occupational History   Not on file  Tobacco Use   Smoking status: Former    Types: Cigarettes    Quit date: 08/01/1970    Years since quitting: 51.9   Smokeless tobacco: Never  Vaping Use   Vaping Use: Never used  Substance and Sexual Activity   Alcohol use: Yes    Alcohol/week: 6.0 - 9.0 standard drinks of alcohol    Types: 4 - 5 Glasses of wine, 2 - 4 Cans of beer per week    Comment: 1-3 glasses 3-4 times a week   Drug use: No   Sexual activity: Not on file  Other Topics Concern   Not on file  Social History Narrative   Not on file   Social Determinants of Health   Financial Resource Strain: Not on file  Food Insecurity: Not on file  Transportation Needs: Not on file  Physical Activity: Not on file  Stress: Not on file  Social Connections: Not on file  Intimate Partner Violence: Not on file    Family History:   The patient's family history includes Deep vein thrombosis in his mother; Diabetes in his maternal grandmother, sister, and sister; Heart attack in his father and sister; Heart disease in his father, paternal uncle, and sister; Hyperlipidemia in his father and sister; Hypertension in his father and sister; Stroke in his mother.    ROS:  Please see the history of present illness.  All other ROS reviewed and negative.     Physical Exam/Data:   Vitals:   06/15/22 1045 06/15/22 1100 06/15/22 1115 06/15/22 1127  BP: 113/79 118/89 118/81   Pulse: 64 67 63   Resp: '12 15 11   '$ Temp:    97.7 F (36.5 C)  TempSrc:    Oral  SpO2: 94% 94% 95%    No intake or output data in the 24 hours ending 06/15/22 1157    06/13/2022    8:04 AM 05/31/2022    7:10 AM 05/15/2022    3:17 PM  Last 3 Weights  Weight (lbs) 203 lb 1.6 oz 199 lb 199 lb  Weight (kg) 92.126 kg 90.266 kg 90.266 kg     There is no height or weight on file to calculate BMI.  General:  Well nourished, well developed, in no  acute distress HEENT: normal Neck: no JVD Vascular: No carotid bruits; Distal pulses 2+ bilaterally   Cardiac:  normal S1, S2; RRR; no murmur  Lungs:  clear to auscultation bilaterally, no wheezing, rhonchi or rales  Abd: soft, nontender, no hepatomegaly  Ext: no edema Musculoskeletal:  No deformities, BUE and BLE strength normal and equal Skin: warm and dry  Neuro:  CNs 2-12 intact, no focal abnormalities noted Psych:  Normal affect    EKG:  The ECG that was done today was personally reviewed and demonstrates normal sinus rhythm, right bundle branch block, new inferolateral ST elevation compared to prior studies  Laboratory Data:  High Sensitivity Troponin:   Recent Labs  Lab 06/15/22 0755 06/15/22 1032  TROPONINIHS 8 661*      Chemistry Recent Labs  Lab 06/13/22 0855 06/15/22 0755  NA 137 136  K 4.1 3.7  CL 101 101  CO2 25 23  GLUCOSE 163* 201*  BUN 27* 36*  CREATININE 1.84* 1.91*  CALCIUM 10.5* 10.4*  GFRNONAA 37* 35*  ANIONGAP 11 12    Recent Labs  Lab 06/15/22 0755  PROT 6.6  ALBUMIN 3.8  AST 22  ALT 27  ALKPHOS 56  BILITOT 0.9   Lipids  Recent Labs  Lab 06/15/22 0755  CHOL 149  TRIG 372*  HDL 30*  LDLCALC 45  CHOLHDL 5.0   Hematology Recent Labs  Lab 06/13/22 0855 06/15/22 0755  WBC 5.5 5.9  RBC 5.33 5.10  HGB 16.6 16.1  HCT 48.4 45.6  MCV 90.8 89.4  MCH 31.1 31.6  MCHC 34.3 35.3  RDW 13.1 13.1  PLT 166 159   Thyroid No results for input(s): "TSH", "FREET4" in the last 168 hours. BNPNo results for input(s): "BNP", "PROBNP" in the last 168 hours.  DDimer No results for input(s): "DDIMER" in the last 168 hours.   Radiology/Studies:  DG Chest Port 1 View  Result Date: 06/15/2022 CLINICAL DATA:  STEMI EXAM: PORTABLE CHEST 1 VIEW COMPARISON:  Chest x-ray dated December 04, 2018 FINDINGS: Cardiac and mediastinal contours are within normal limits for technique. Prior median sternotomy and CABG. Mild left basilar opacity which is likely  due to atelectasis. No focal consolidation. No large pleural effusion or evidence of pneumothorax. IMPRESSION: No acute cardiopulmonary abnormality. Electronically Signed   By: Yetta Glassman M.D.   On: 06/15/2022 10:37   CARDIAC CATHETERIZATION  Result Date: 06/15/2022 1.  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  Status post CABG with continued patency of the LIMA to LAD, saphenous vein graft to acute marginal, and saphenous vein graft to PDA with extensive diffuse degenerative changes. 3.  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  Acute occlusion of the saphenous vein graft to left posterolateral branch with extensive degenerative changes  and unsuccessful PCI due to inability to restore TIMI-3 flow in an extensively degenerated graft 5.  Normal LVEDP Plan: Patient loaded with clopidogrel 600 mg prior to the procedure, would continue at least 3 months and he will need to delay his planned back surgery for this duration.  Resume apixaban tomorrow morning.  Check 2D echocardiogram.  Post MI CV-ICU care, anticipate hospital discharge in 48 hours if no complications arise.     Assessment and Plan:   Coronary artery disease with acute inferolateral STEMI Hypertension Type 2 diabetes Mixed hyperlipidemia Paroxysmal atrial fibrillation chronically anticoagulated Stage IIIb chronic kidney disease  The patient presents with acute onset chest tightness, no EKG changes suggestive of inferolateral injury, in the setting of holding antiplatelet and anticoagulant therapy for scheduled surgery tomorrow.  His clinical presentation is consistent with acute inferolateral STEMI.  He will be brought emergently to the cardiac catheterization lab for catheterization and possible PCI.  I reviewed risks, indications, and alternatives to cardiac  catheterization, angioplasty, and stenting with the patient and his son who is present at the bedside.  Emergency implied consent is obtained in the setting of STEMI.  Further plans/disposition pending cardiac catheterization/PCI results.  We will load the patient with clopidogrel 600 mg orally before the procedure.   Risk Assessment/Risk Scores:    TIMI Risk Score for ST  Elevation MI:   The patient's TIMI risk score is 4, which indicates a 7.3% risk of all cause mortality at 30 days.        Severity of Illness: The appropriate patient status for this patient is INPATIENT. Inpatient status is judged to be reasonable and necessary in order to provide the required intensity of service to ensure the patient's safety. The patient's presenting symptoms, physical exam findings, and initial radiographic and laboratory data in the context of their chronic comorbidities is felt to place them at high risk for further clinical deterioration. Furthermore, it is not anticipated that the patient will be medically stable for discharge from the hospital within 2 midnights of admission.   * I certify that at the point of admission it is my clinical judgment that the patient will require inpatient hospital care spanning beyond 2 midnights from the point of admission due to high intensity of service, high risk for further deterioration and high frequency of surveillance required.*   For questions or updates, please contact [place] Please consult [email or web address] for contact info under     Signed, Sherren Mocha, MD  06/15/2022 11:57 AM

## 2022-06-15 NOTE — ED Triage Notes (Signed)
Pt reports he woke up with left sided chest pressure this morning

## 2022-06-15 NOTE — Progress Notes (Signed)
  Echocardiogram 2D Echocardiogram has been performed.  Garrett Jordan F 06/15/2022, 12:29 PM

## 2022-06-16 ENCOUNTER — Encounter (HOSPITAL_COMMUNITY): Payer: Self-pay | Admitting: Cardiovascular Disease

## 2022-06-16 ENCOUNTER — Inpatient Hospital Stay: Payer: Self-pay

## 2022-06-16 ENCOUNTER — Ambulatory Visit (HOSPITAL_COMMUNITY): Admission: RE | Admit: 2022-06-16 | Payer: Medicare Other | Source: Home / Self Care | Admitting: Neurological Surgery

## 2022-06-16 ENCOUNTER — Other Ambulatory Visit: Payer: Self-pay

## 2022-06-16 DIAGNOSIS — I2119 ST elevation (STEMI) myocardial infarction involving other coronary artery of inferior wall: Secondary | ICD-10-CM | POA: Diagnosis not present

## 2022-06-16 DIAGNOSIS — I213 ST elevation (STEMI) myocardial infarction of unspecified site: Secondary | ICD-10-CM

## 2022-06-16 DIAGNOSIS — Z8616 Personal history of COVID-19: Secondary | ICD-10-CM | POA: Diagnosis not present

## 2022-06-16 DIAGNOSIS — I472 Ventricular tachycardia, unspecified: Secondary | ICD-10-CM | POA: Diagnosis not present

## 2022-06-16 DIAGNOSIS — N179 Acute kidney failure, unspecified: Secondary | ICD-10-CM | POA: Diagnosis not present

## 2022-06-16 LAB — BASIC METABOLIC PANEL
Anion gap: 9 (ref 5–15)
BUN: 25 mg/dL — ABNORMAL HIGH (ref 8–23)
CO2: 23 mmol/L (ref 22–32)
Calcium: 10.1 mg/dL (ref 8.9–10.3)
Chloride: 104 mmol/L (ref 98–111)
Creatinine, Ser: 1.67 mg/dL — ABNORMAL HIGH (ref 0.61–1.24)
GFR, Estimated: 41 mL/min — ABNORMAL LOW (ref >60)
Glucose, Bld: 128 mg/dL — ABNORMAL HIGH (ref 70–99)
Potassium: 3.7 mmol/L (ref 3.5–5.1)
Sodium: 136 mmol/L (ref 135–145)

## 2022-06-16 LAB — CBC
HCT: 40.4 % (ref 39.0–52.0)
Hemoglobin: 14.6 g/dL (ref 13.0–17.0)
MCH: 31.8 pg (ref 26.0–34.0)
MCHC: 36.1 g/dL — ABNORMAL HIGH (ref 30.0–36.0)
MCV: 88 fL (ref 80.0–100.0)
Platelets: 135 10*3/uL — ABNORMAL LOW (ref 150–400)
RBC: 4.59 MIL/uL (ref 4.22–5.81)
RDW: 13.1 % (ref 11.5–15.5)
WBC: 6.2 10*3/uL (ref 4.0–10.5)
nRBC: 0 % (ref 0.0–0.2)

## 2022-06-16 LAB — GLUCOSE, CAPILLARY
Glucose-Capillary: 170 mg/dL — ABNORMAL HIGH (ref 70–99)
Glucose-Capillary: 140 mg/dL — ABNORMAL HIGH (ref 70–99)
Glucose-Capillary: 120 mg/dL — ABNORMAL HIGH (ref 70–99)
Glucose-Capillary: 172 mg/dL — ABNORMAL HIGH (ref 70–99)

## 2022-06-16 SURGERY — LUMBAR LAMINECTOMY/DECOMPRESSION MICRODISCECTOMY 1 LEVEL
Anesthesia: General | Site: Back | Laterality: Bilateral

## 2022-06-16 MED ORDER — ATORVASTATIN CALCIUM 40 MG PO TABS
40.0000 mg | ORAL_TABLET | Freq: Every day | ORAL | Status: DC
  Administered 2022-06-16 – 2022-06-18 (×3): 40 mg via ORAL
  Filled 2022-06-16 (×3): qty 1

## 2022-06-16 MED ORDER — PANTOPRAZOLE SODIUM 20 MG PO TBEC
20.0000 mg | DELAYED_RELEASE_TABLET | Freq: Every day | ORAL | Status: DC
  Administered 2022-06-16 – 2022-06-18 (×3): 20 mg via ORAL
  Filled 2022-06-16 (×4): qty 1

## 2022-06-16 MED ORDER — CHLORHEXIDINE GLUCONATE CLOTH 2 % EX PADS
6.0000 | MEDICATED_PAD | Freq: Every day | CUTANEOUS | Status: DC
  Administered 2022-06-16: 6 via TOPICAL

## 2022-06-16 NOTE — Progress Notes (Signed)
CARDIAC REHAB PHASE I     Pt resting in bed. He has been ambulating well independently with no CP or SOB. He has walked a few times this morning. Home education including risk factors, MI booklet, site care, antiplatelet therapy importance, restrictions, exercise guidelines, heart healthy diet, and CRP2. All questions and concerns addressed. Pt being transferred out of ICU today. He is hoping for home tomorrow. Will continue to follow. CRP2 referral sent to [facility].   [id number]  Vanessa Barbara, RN BSN 06/16/2022 12:16 PM

## 2022-06-16 NOTE — Progress Notes (Signed)
Progress Note  Patient Name: ZOHAR MARONEY Sr. Date of Encounter: 06/16/2022  [facility] Cardiologist: Candee Furbish, MD   Subjective   S/p unsuccessful PCI of VG to OM.  Doing well, denies chest pain, dyspnea.  Biggest complaint is poor sleep.   Inpatient Medications    Scheduled Meds:  allopurinol  300 mg Oral QODAY   apixaban  5 mg Oral BID   aspirin  81 mg Oral Daily   Chlorhexidine Gluconate Cloth  6 each Topical Daily   clonazePAM  0.5 mg Oral QHS   clopidogrel  75 mg Oral Daily   dapagliflozin propanediol  10 mg Oral Daily   ezetimibe  10 mg Oral Daily   fenofibrate  160 mg Oral Daily   icosapent Ethyl  2 g Oral BID   insulin aspart  0-15 Units Subcutaneous TID WC   levothyroxine  175 mcg Oral QAC breakfast   metoprolol tartrate  25 mg Oral BID   [START ON 06/19/2022] rosuvastatin  5 mg Oral Once per day on Mon Thu   sodium chloride flush  3 mL Intravenous Q12H   Continuous Infusions:  sodium chloride Stopped (06/16/22 0539)   sodium chloride     PRN Meds: sodium chloride, acetaminophen, ondansetron (ZOFRAN) IV, mouth rinse, sodium chloride flush   Vital Signs    Vitals:   06/16/22 0500 06/16/22 0600 06/16/22 0700 06/16/22 0732  BP: (!) 91/58 102/71 (!) 121/91   Pulse: 71 75 81 72  Resp: '13 11 18 13  '$ Temp:      TempSrc:      SpO2: 91% 95% 95% 92%    Intake/Output Summary (Last 24 hours) at 06/16/2022 0807 Last data filed at 06/16/2022 0600 Gross per 24 hour  Intake 889.7 ml  Output 2375 ml  Net -1485.3 ml      06/13/2022    8:04 AM 05/31/2022    7:10 AM 05/15/2022    3:17 PM  Last 3 Weights  Weight (lbs) 203 lb 1.6 oz 199 lb 199 lb  Weight (kg) 92.126 kg 90.266 kg 90.266 kg      Telemetry    SR with occ NSVT- Personally Reviewed  ECG    SR RBBB with inferior STD- Personally Reviewed  Physical Exam   GEN: No acute distress.   Neck: No JVD Cardiac: RRR, no murmurs, rubs, or gallops.  Respiratory: Clear to auscultation bilaterally. GI:  Soft, nontender, non-distended  MS: No edema; No deformity. Neuro:  Nonfocal  Psych: Normal affect   Labs    High Sensitivity Troponin:   Recent Labs  Lab 06/15/22 0755 06/15/22 1032  TROPONINIHS 8 661*     Chemistry Recent Labs  Lab 06/13/22 0855 06/15/22 0755 06/16/22 0644  NA 137 136 136  K 4.1 3.7 3.7  CL 101 101 104  CO2 '25 23 23  '$ GLUCOSE 163* 201* 128*  BUN 27* 36* 25*  CREATININE 1.84* 1.91* 1.67*  CALCIUM 10.5* 10.4* 10.1  PROT  --  6.6  --   ALBUMIN  --  3.8  --   AST  --  22  --   ALT  --  27  --   ALKPHOS  --  56  --   BILITOT  --  0.9  --   GFRNONAA 37* 35* 41*  ANIONGAP '11 12 9    '$ Lipids  Recent Labs  Lab 06/15/22 0755  CHOL 149  TRIG 372*  HDL 30*  LDLCALC 45  CHOLHDL 5.0  Hematology Recent Labs  Lab 06/13/22 0855 06/15/22 0755 06/16/22 0644  WBC 5.5 5.9 6.2  RBC 5.33 5.10 4.59  HGB 16.6 16.1 14.6  HCT 48.4 45.6 40.4  MCV 90.8 89.4 88.0  MCH 31.1 31.6 31.8  MCHC 34.3 35.3 36.1*  RDW 13.1 13.1 13.1  PLT 166 159 135*   Thyroid No results for input(s): "TSH", "FREET4" in the last 168 hours.  BNPNo results for input(s): "BNP", "PROBNP" in the last 168 hours.  DDimer No results for input(s): "DDIMER" in the last 168 hours.   Radiology    ECHOCARDIOGRAM COMPLETE  Result Date: 06/15/2022    ECHOCARDIOGRAM REPORT   Patient Name:   HARALAMBOS YEATTS Sr. Date of Exam: 06/15/2022 Medical Rec #:  [id number]          Height:       69.5 in Accession #:    [id number]         Weight:       203.1 lb Date of Birth:  19-Mar-1941          BSA:          2.090 m Patient Age:    81 years           BP:           109/72 mmHg Patient Gender: M                  HR:           62 bpm. Exam Location:  Inpatient Procedure: 2D Echo, Cardiac Doppler and Color Doppler Indications:    Inferior STEMI  History:        Patient has prior history of Echocardiogram examinations. CHF,                 CAD, Cardiac cath 06/16/22 and Prior CABG; Arrythmias:Atrial                  Fibrillation.  Sonographer:    Merrie Roof RDCS Referring Phys: [place]  1. Left ventricular ejection fraction, by estimation, is 55 to 60%. The left ventricle has normal function. The left ventricle demonstrates global hypokinesis. There is mild concentric left ventricular hypertrophy. Left ventricular diastolic parameters are indeterminate.  2. Right ventricular systolic function is mildly reduced. The right ventricular size is normal.  3. Left atrial size was moderately dilated.  4. Right atrial size was moderately dilated.  5. The mitral valve is normal in structure. No evidence of mitral valve regurgitation. No evidence of mitral stenosis.  6. Tricuspid valve regurgitation is moderate.  7. The aortic valve is normal in structure. Aortic valve regurgitation is not visualized. No aortic stenosis is present.  8. Aneurysm of the ascending aorta, measuring 44 mm. There is mild dilatation of the aortic root, measuring 39 mm.  9. The inferior vena cava is normal in size with greater than 50% respiratory variability, suggesting right atrial pressure of 3 mmHg. FINDINGS  Left Ventricle: Left ventricular ejection fraction, by estimation, is 55 to 60%. The left ventricle has normal function. The left ventricle demonstrates global hypokinesis. The left ventricular internal cavity size was normal in size. There is mild concentric left ventricular hypertrophy. Left ventricular diastolic parameters are indeterminate.  LV Wall Scoring: The basal inferolateral segment and basal inferior segment are hypokinetic. The entire anterior wall, antero-lateral wall, mid and distal lateral wall, entire septum, entire apex, and mid and distal inferior wall are normal. Right Ventricle: The right  ventricular size is normal. No increase in right ventricular wall thickness. Right ventricular systolic function is mildly reduced. Left Atrium: Left atrial size was moderately dilated. Right Atrium: Right atrial size was  moderately dilated. Pericardium: There is no evidence of pericardial effusion. Presence of epicardial fat layer. Mitral Valve: The mitral valve is normal in structure. No evidence of mitral valve regurgitation. No evidence of mitral valve stenosis. Tricuspid Valve: The tricuspid valve is normal in structure. Tricuspid valve regurgitation is moderate . No evidence of tricuspid stenosis. Aortic Valve: The aortic valve is normal in structure. Aortic valve regurgitation is not visualized. No aortic stenosis is present. Pulmonic Valve: The pulmonic valve was normal in structure. Pulmonic valve regurgitation is trivial. No evidence of pulmonic stenosis. Aorta: The aortic root is normal in size and structure. There is mild dilatation of the aortic root, measuring 39 mm. There is an aneurysm involving the ascending aorta measuring 44 mm. Venous: The inferior vena cava is normal in size with greater than 50% respiratory variability, suggesting right atrial pressure of 3 mmHg. IAS/Shunts: No atrial level shunt detected by color flow Doppler.  LEFT VENTRICLE PLAX 2D LVIDd:         4.90 cm LVIDs:         3.90 cm LV PW:         1.20 cm LV IVS:        1.10 cm LVOT diam:     2.00 cm LV SV:         43 LV SV Index:   20 LVOT Area:     3.14 cm  RIGHT VENTRICLE RV Basal diam:  3.80 cm RV S prime:     5.48 cm/s TAPSE (M-mode): 0.9 cm LEFT ATRIUM              Index        RIGHT ATRIUM           Index LA diam:        4.50 cm  2.15 cm/m   RA Area:     25.90 cm LA Vol (A2C):   112.0 ml 53.59 ml/m  RA Volume:   90.40 ml  43.26 ml/m LA Vol (A4C):   80.0 ml  38.28 ml/m LA Biplane Vol: 98.7 ml  47.23 ml/m  AORTIC VALVE LVOT Vmax:   63.90 cm/s LVOT Vmean:  40.900 cm/s LVOT VTI:    0.136 m  AORTA Ao Root diam: 3.90 cm Ao Asc diam:  4.40 cm TRICUSPID VALVE TR Peak grad:   29.8 mmHg TR Vmax:        273.00 cm/s  SHUNTS Systemic VTI:  0.14 m Systemic Diam: 2.00 cm Kardie Tobb DO Electronically signed by Berniece Salines DO Signature Date/Time:  06/15/2022/1:26:59 PM    Final    CARDIAC CATHETERIZATION  Result Date: 06/15/2022 1.  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  Status post CABG with continued patency of the LIMA to LAD, saphenous vein graft to acute marginal, and saphenous vein graft to PDA with extensive diffuse degenerative changes. 3.  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  Acute occlusion of the saphenous vein graft to left posterolateral branch with extensive degenerative changes and unsuccessful PCI due to inability to restore TIMI-3 flow in an extensively degenerated graft 5.  Normal LVEDP Plan: Patient loaded  with clopidogrel 600 mg prior to the procedure, would continue at least 3 months and he will need to delay his planned back surgery for this duration.  Resume apixaban tomorrow morning.  Check 2D echocardiogram.  Post MI CV-ICU care, anticipate hospital discharge in 48 hours if no complications arise.   DG Chest Port 1 View  Result Date: 06/15/2022 CLINICAL DATA:  STEMI EXAM: PORTABLE CHEST 1 VIEW COMPARISON:  Chest x-ray dated December 04, 2018 FINDINGS: Cardiac and mediastinal contours are within normal limits for technique. Prior median sternotomy and CABG. Mild left basilar opacity which is likely due to atelectasis. No focal consolidation. No large pleural effusion or evidence of pneumothorax. IMPRESSION: No acute cardiopulmonary abnormality. Electronically Signed   By: Yetta Glassman M.D.   On: 06/15/2022 10:37    Cardiac Studies   8/3 Cath reviewed; patent LIMA to LAD, VG to PDA, VG to RV marginal, and occluded VG to OM  8/3 TTE with preserved EF with inferolateral HK  Patient Profile     81 y.o. male with PMH of CABG, T2DM, HTN, HL, AAA, PAF, CKD3 here with inferolateral STEMI after holding plavix and Eliquis now s/p unsuccessful  PCI of VG to OM2  Assessment & Plan     STEMI:  Medical treatment of STEMI and patient is doing well.  EF preserved.  Will cont triple therapy for 1 month with ASA/plavix/apixiban then drop ASA.  Treat for 3 months then consider neurosurgery.  Cont metoprolol.  NSVT burden not high.  Will transfer out of ICU today.  Likely d/c in AM. HTN:  BP well controlled HL:  On Crestor 5 at home and in hospital due to intolerance.  Patient has not tried atorvastatin.   Would benefit from 80 atorvastatin but will trial atorvastatin 40 for now. AAA s/p EVAR:  Cont plavix T2DM:  Cont Farxiga, SSI CKD III:  Cr down to 1.67; consider losartan tomorrow if Cr stable. NSVT:  Cont BB, keep lytes stable.  For questions or updates, please contact [place] Please consult [email or web address] for contact info under        Signed, Early Osmond, MD  06/16/2022, 8:07 AM

## 2022-06-16 NOTE — Progress Notes (Signed)
Patient transferred from 2h at 1259hrs.  Oriented to unit and plan of care for shift.  Left radial site with bruising but soft to palpation. Patient denies CP/SOB.

## 2022-06-16 NOTE — Care Management (Signed)
  Transition of Care [facility]) Screening Note   Patient Details  Name: Garrett BIELEFELD Sr. Date of Birth: [birth year]   [facility] of [facility] CM/SW Contact:    Bethena Roys, RN Phone Number: 06/16/2022, 1:01 PM    Transition of Care Department [facility]) has reviewed the patient and no TOC needs have been identified at this time. Case Manager will continue to monitor patients advancement through interdisciplinary progression rounds. If new patient transition needs arise, please place a TOC consult.

## 2022-06-17 DIAGNOSIS — I213 ST elevation (STEMI) myocardial infarction of unspecified site: Secondary | ICD-10-CM | POA: Diagnosis not present

## 2022-06-17 LAB — BASIC METABOLIC PANEL
Anion gap: 9 (ref 5–15)
BUN: 30 mg/dL — ABNORMAL HIGH (ref 8–23)
CO2: 23 mmol/L (ref 22–32)
Calcium: 10.6 mg/dL — ABNORMAL HIGH (ref 8.9–10.3)
Chloride: 103 mmol/L (ref 98–111)
Creatinine, Ser: 2.14 mg/dL — ABNORMAL HIGH (ref 0.61–1.24)
GFR, Estimated: 31 mL/min — ABNORMAL LOW (ref >60)
Glucose, Bld: 140 mg/dL — ABNORMAL HIGH (ref 70–99)
Potassium: 4 mmol/L (ref 3.5–5.1)
Sodium: 135 mmol/L (ref 135–145)

## 2022-06-17 LAB — GLUCOSE, CAPILLARY
Glucose-Capillary: 231 mg/dL — ABNORMAL HIGH (ref 70–99)
Glucose-Capillary: 139 mg/dL — ABNORMAL HIGH (ref 70–99)
Glucose-Capillary: 134 mg/dL — ABNORMAL HIGH (ref 70–99)
Glucose-Capillary: 128 mg/dL — ABNORMAL HIGH (ref 70–99)

## 2022-06-17 LAB — LIPOPROTEIN A (LPA): Lipoprotein (a): 11.1 nmol/L (ref <75.0)

## 2022-06-17 LAB — MAGNESIUM: Magnesium: 1.9 mg/dL (ref 1.7–2.4)

## 2022-06-17 MED ORDER — NITROGLYCERIN 0.4 MG SL SUBL: 0.4000 mg | SUBLINGUAL_TABLET | SUBLINGUAL | 3 refills | Status: AC | PRN

## 2022-06-17 MED ORDER — ASPIRIN 81 MG PO CHEW: 81.0000 mg | CHEWABLE_TABLET | Freq: Every day | ORAL | Status: DC

## 2022-06-17 MED ORDER — ASPIRIN 81 MG PO CHEW
81.0000 mg | CHEWABLE_TABLET | Freq: Every day | ORAL | Status: DC
Start: 2022-06-18 — End: 2022-08-15

## 2022-06-17 MED ORDER — ATORVASTATIN CALCIUM 40 MG PO TABS: 40.0000 mg | ORAL_TABLET | Freq: Every day | ORAL | 11 refills | Status: DC

## 2022-06-17 NOTE — Progress Notes (Signed)
Progress Note  Patient Name: Garrett ARNDT Sr. Date of Encounter: 06/17/2022  [facility] Cardiologist: Candee Furbish, MD   Subjective   NAEO. Cr 2.14 this AM.   Inpatient Medications    Scheduled Meds:  allopurinol  300 mg Oral QODAY   apixaban  5 mg Oral BID   aspirin  81 mg Oral Daily   atorvastatin  40 mg Oral Daily   clonazePAM  0.5 mg Oral QHS   clopidogrel  75 mg Oral Daily   ezetimibe  10 mg Oral Daily   fenofibrate  160 mg Oral Daily   icosapent Ethyl  2 g Oral BID   insulin aspart  0-15 Units Subcutaneous TID WC   levothyroxine  175 mcg Oral QAC breakfast   metoprolol tartrate  25 mg Oral BID   pantoprazole  20 mg Oral Daily   [START ON 06/19/2022] rosuvastatin  5 mg Oral Once per day on Mon Thu   sodium chloride flush  3 mL Intravenous Q12H   Continuous Infusions:  sodium chloride Stopped (06/16/22 0539)   sodium chloride     PRN Meds: sodium chloride, acetaminophen, ondansetron (ZOFRAN) IV, mouth rinse, sodium chloride flush   Vital Signs    Vitals:   06/16/22 1627 06/16/22 2021 06/17/22 0520 06/17/22 0858  BP: 131/84 127/89 100/63 (!) 122/92  Pulse: 75 81 70 72  Resp: '14 18 16 16  '$ Temp: 98.4 F (36.9 C) 98.2 F (36.8 C) 98.2 F (36.8 C) (!) 97.5 F (36.4 C)  TempSrc: Oral Oral Oral Oral  SpO2: 92% 93% 94% 95%  Weight:      Height:        Intake/Output Summary (Last 24 hours) at 06/17/2022 1050 Last data filed at 06/16/2022 1800 Gross per 24 hour  Intake 480 ml  Output --  Net 480 ml      06/16/2022    1:03 PM 06/13/2022    8:04 AM 05/31/2022    7:10 AM  Last 3 Weights  Weight (lbs) 203 lb 4.2 oz 203 lb 1.6 oz 199 lb  Weight (kg) 92.2 kg 92.126 kg 90.266 kg      Telemetry    Personally Reviewed  ECG    Personally Reviewed  Physical Exam   GEN: No acute distress.   Neck: No JVD Cardiac: RRR, no murmurs, rubs, or gallops. L radial access site with mild bruising. No hematoma. Distal cap circulation intact. Respiratory: Clear to  auscultation bilaterally. GI: Soft, nontender, non-distended  MS: No edema; No deformity. Neuro:  Nonfocal  Psych: Normal affect   Labs    High Sensitivity Troponin:   Recent Labs  Lab 06/15/22 0755 06/15/22 1032  TROPONINIHS 8 661*     Chemistry Recent Labs  Lab 06/15/22 0755 06/16/22 0644 06/17/22 0208  NA 136 136 135  K 3.7 3.7 4.0  CL 101 104 103  CO2 '23 23 23  '$ GLUCOSE 201* 128* 140*  BUN 36* 25* 30*  CREATININE 1.91* 1.67* 2.14*  CALCIUM 10.4* 10.1 10.6*  MG  --   --  1.9  PROT 6.6  --   --   ALBUMIN 3.8  --   --   AST 22  --   --   ALT 27  --   --   ALKPHOS 56  --   --   BILITOT 0.9  --   --   GFRNONAA 35* 41* 31*  ANIONGAP '12 9 9    '$ Lipids  Recent Labs  Lab 06/15/22 0755  CHOL 149  TRIG 372*  HDL 30*  LDLCALC 45  CHOLHDL 5.0    Hematology Recent Labs  Lab 06/13/22 0855 06/15/22 0755 06/16/22 0644  WBC 5.5 5.9 6.2  RBC 5.33 5.10 4.59  HGB 16.6 16.1 14.6  HCT 48.4 45.6 40.4  MCV 90.8 89.4 88.0  MCH 31.1 31.6 31.8  MCHC 34.3 35.3 36.1*  RDW 13.1 13.1 13.1  PLT 166 159 135*   Thyroid No results for input(s): "TSH", "FREET4" in the last 168 hours.  BNPNo results for input(s): "BNP", "PROBNP" in the last 168 hours.  DDimer No results for input(s): "DDIMER" in the last 168 hours.   Radiology    ECHOCARDIOGRAM COMPLETE  Result Date: 06/15/2022    ECHOCARDIOGRAM REPORT   Patient Name:   Garrett CLOPPER Sr. Date of Exam: 06/15/2022 Medical Rec #:  [id number]          Height:       69.5 in Accession #:    [id number]         Weight:       203.1 lb Date of Birth:  17-Mar-1941          BSA:          2.090 m Patient Age:    21 years           BP:           109/72 mmHg Patient Gender: M                  HR:           62 bpm. Exam Location:  Inpatient Procedure: 2D Echo, Cardiac Doppler and Color Doppler Indications:    Inferior STEMI  History:        Patient has prior history of Echocardiogram examinations. CHF,                 CAD, Cardiac cath 06/16/22 and  Prior CABG; Arrythmias:Atrial                 Fibrillation.  Sonographer:    Merrie Roof RDCS Referring Phys: [place]  1. Left ventricular ejection fraction, by estimation, is 55 to 60%. The left ventricle has normal function. The left ventricle demonstrates global hypokinesis. There is mild concentric left ventricular hypertrophy. Left ventricular diastolic parameters are indeterminate.  2. Right ventricular systolic function is mildly reduced. The right ventricular size is normal.  3. Left atrial size was moderately dilated.  4. Right atrial size was moderately dilated.  5. The mitral valve is normal in structure. No evidence of mitral valve regurgitation. No evidence of mitral stenosis.  6. Tricuspid valve regurgitation is moderate.  7. The aortic valve is normal in structure. Aortic valve regurgitation is not visualized. No aortic stenosis is present.  8. Aneurysm of the ascending aorta, measuring 44 mm. There is mild dilatation of the aortic root, measuring 39 mm.  9. The inferior vena cava is normal in size with greater than 50% respiratory variability, suggesting right atrial pressure of 3 mmHg. FINDINGS  Left Ventricle: Left ventricular ejection fraction, by estimation, is 55 to 60%. The left ventricle has normal function. The left ventricle demonstrates global hypokinesis. The left ventricular internal cavity size was normal in size. There is mild concentric left ventricular hypertrophy. Left ventricular diastolic parameters are indeterminate.  LV Wall Scoring: The basal inferolateral segment and basal inferior segment are hypokinetic. The entire anterior wall, antero-lateral wall,  mid and distal lateral wall, entire septum, entire apex, and mid and distal inferior wall are normal. Right Ventricle: The right ventricular size is normal. No increase in right ventricular wall thickness. Right ventricular systolic function is mildly reduced. Left Atrium: Left atrial size was moderately  dilated. Right Atrium: Right atrial size was moderately dilated. Pericardium: There is no evidence of pericardial effusion. Presence of epicardial fat layer. Mitral Valve: The mitral valve is normal in structure. No evidence of mitral valve regurgitation. No evidence of mitral valve stenosis. Tricuspid Valve: The tricuspid valve is normal in structure. Tricuspid valve regurgitation is moderate . No evidence of tricuspid stenosis. Aortic Valve: The aortic valve is normal in structure. Aortic valve regurgitation is not visualized. No aortic stenosis is present. Pulmonic Valve: The pulmonic valve was normal in structure. Pulmonic valve regurgitation is trivial. No evidence of pulmonic stenosis. Aorta: The aortic root is normal in size and structure. There is mild dilatation of the aortic root, measuring 39 mm. There is an aneurysm involving the ascending aorta measuring 44 mm. Venous: The inferior vena cava is normal in size with greater than 50% respiratory variability, suggesting right atrial pressure of 3 mmHg. IAS/Shunts: No atrial level shunt detected by color flow Doppler.  LEFT VENTRICLE PLAX 2D LVIDd:         4.90 cm LVIDs:         3.90 cm LV PW:         1.20 cm LV IVS:        1.10 cm LVOT diam:     2.00 cm LV SV:         43 LV SV Index:   20 LVOT Area:     3.14 cm  RIGHT VENTRICLE RV Basal diam:  3.80 cm RV S prime:     5.48 cm/s TAPSE (M-mode): 0.9 cm LEFT ATRIUM              Index        RIGHT ATRIUM           Index LA diam:        4.50 cm  2.15 cm/m   RA Area:     25.90 cm LA Vol (A2C):   112.0 ml 53.59 ml/m  RA Volume:   90.40 ml  43.26 ml/m LA Vol (A4C):   80.0 ml  38.28 ml/m LA Biplane Vol: 98.7 ml  47.23 ml/m  AORTIC VALVE LVOT Vmax:   63.90 cm/s LVOT Vmean:  40.900 cm/s LVOT VTI:    0.136 m  AORTA Ao Root diam: 3.90 cm Ao Asc diam:  4.40 cm TRICUSPID VALVE TR Peak grad:   29.8 mmHg TR Vmax:        273.00 cm/s  SHUNTS Systemic VTI:  0.14 m Systemic Diam: 2.00 cm Kardie Tobb DO Electronically  signed by Berniece Salines DO Signature Date/Time: 06/15/2022/1:26:59 PM    Final       Assessment & Plan    81 y.o. male with PMH of CABG, T2DM, HTN, HL, AAA, PAF, CKD3 here with inferolateral STEMI after holding plavix and Eliquis now s/p unsuccessful PCI of VG to OM2  #STEMI EF preserved. No CP.  Continue metoprolol. Continue ASA/Plavix/Eliquis for 1 month then drop Aspirin.  #HTN Controlled  #HLD Cont atorvastatin  #AKI on CKD3 Cr back up this AM. Hold farxiga. Will have diabetes team weigh in re: alternative given renal dysfunction.  Plan to observe for an additional 24 hours with recheck of kidney function  in AM.     For questions or updates, please contact [place] Please consult [email or web address] for contact info under        Signed, Vickie Epley, MD  06/17/2022, 10:50 AM

## 2022-06-17 NOTE — Progress Notes (Signed)
CARDIAC REHAB PHASE I   Pt just returned from walk.  Pt states he is feeling good, reviewed education from yesterday and pt seems eager to return to [place], he has completed the program before and enjoyed it. All questions were answered and pt was left in bed with call bell in reach. Will try to return for walk later.  Kirby Funk ACSM-CEP 06/17/2022 7:51 AM

## 2022-06-17 NOTE — Progress Notes (Signed)
CARDIAC REHAB PHASE I   PRE:  Rate/Rhythm: 77  BP:  Supine: 122/92     SaO2: 98 RA  MODE:  Ambulation: 940 ft   POST:  Rate/Rhythm: 78   BP:  Supine: 135/85      SaO2: 98 RA  Pt tolerated exercise well and AMB 940 ft with no assistive device, and standby assist. Pt had no rest break, chest pain, SOB or pain. Reviewed education from yesterday, all questions were answered and pt was left in recliner.  [id number] Kirby Funk ACSM-CEP 06/17/2022 9:26 AM

## 2022-06-17 NOTE — Discharge Summary (Addendum)
Discharge Summary    Patient ID: Garrett UMAR Sr. MRN: [id number]; DOB: 09/30/1941  Admit date: 06/15/2022 Discharge date: 06/17/2022  PCP:  Antony Contras, MD   [facility] Providers Cardiologist:  Candee Furbish, MD        Discharge Diagnoses    Principal Problem:   STEMI (ST elevation myocardial infarction) [facility])    Diagnostic Studies/Procedures    ECHO: 06/15/2022  1. Left ventricular ejection fraction, by estimation, is 55 to 60%. The  left ventricle has normal function. The left ventricle demonstrates global  hypokinesis. There is mild concentric left ventricular hypertrophy. Left  ventricular diastolic parameters  are indeterminate.   2. Right ventricular systolic function is mildly reduced. The right  ventricular size is normal.   3. Left atrial size was moderately dilated.   4. Right atrial size was moderately dilated.   5. The mitral valve is normal in structure. No evidence of mitral valve  regurgitation. No evidence of mitral stenosis.   6. Tricuspid valve regurgitation is moderate.   7. The aortic valve is normal in structure. Aortic valve regurgitation is  not visualized. No aortic stenosis is present.   8. Aneurysm of the ascending aorta, measuring 44 mm. There is mild  dilatation of the aortic root, measuring 39 mm.   9. The inferior vena cava is normal in size with greater than 50%  respiratory variability, suggesting right atrial pressure of 3 mmHg.   CARDIAC CATH: 08/032023 1.  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  Status post CABG with continued patency of the LIMA to LAD, saphenous vein graft to acute marginal, and saphenous vein graft to PDA with extensive diffuse degenerative changes. 3.  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  Acute occlusion of the saphenous vein  graft to left posterolateral branch with extensive degenerative changes and unsuccessful PCI due to inability to restore TIMI-3 flow in an extensively degenerated graft 5.  Normal LVEDP   Plan: Patient loaded with clopidogrel 600 mg prior to the procedure, would continue at least 3 months and he will need to delay his planned back surgery for this duration.  Resume apixaban tomorrow morning.  Check 2D echocardiogram.  Post MI CV-ICU care, anticipate hospital discharge in 48 hours if no complications arise. Diagnostic Dominance: Right  Intervention    _____________   History of Present Illness     Garrett Elison. is a 81 y.o. male with PMH of CABG, T2DM, HTN, HL, AAA, PAF, CKD3 was admitted 08/03 with inferolateral STEMI after holding plavix and Eliquis now s/p unsuccessful PCI of VG to OM2.   Hospital Course     Consultants: none    STEMI:  Medical treatment of STEMI and patient is doing well.  EF preserved.  Will cont triple therapy for 1 month with ASA/plavix/apixiban then drop ASA.  Treat for 3 months then consider neurosurgery.  Cont metoprolol.  NSVT burden not high.  Has been seen by cardiac rehab and has done well with ambulation.  No chest pain. HTN:  BP well controlled, his amlodipine was held on admission, continue to hold this and continue other home blood pressure medications. HL:  On Crestor 5 at home and in hospital due to intolerance.  Patient has not tried atorvastatin.   Would benefit from 80 atorvastatin but will trial  atorvastatin 40 for now.  He has not yet had any side effects from the atorvastatin but his only had 1 pill.  He will continue taking this and titrate the dose down if he has side effects.  He will let us know how he does with this. AAA s/p EVAR:  Cont plavix T2DM:  Cont Farxiga CKD III: Creatinine 1.91 on admission, then down to 1.67; olmesartan/HCTZ held on admisison.  Today, creatinine is up to 2.14, continue to hold the losartan/HCTZ and  [place] Kitchen NSVT:  Cont BB, keep lytes stable.  Asymptomatic, may have been from his MI as the frequency of ectopy has decreased over the last 48 hours. DM: Because his Cr is climbing, hold the [place], DM team to make recommendations for alternative meds. Plan: Discharge today, will send a message to get him a TOC follow-up appointment.  Did the patient have an acute coronary syndrome (MI, NSTEMI, STEMI, etc) this admission?:  Yes                               AHA/ACC Clinical Performance & Quality Measures: Aspirin prescribed? - Yes ADP Receptor Inhibitor (Plavix/Clopidogrel, Brilinta/Ticagrelor or Effient/Prasugrel) prescribed (includes medically managed patients)? - Yes Beta Blocker prescribed? - Yes High Intensity Statin (Lipitor 40-'80mg'$  or Crestor 20-'40mg'$ ) prescribed? - Yes EF assessed during THIS hospitalization? - Yes For EF <40%, was ACEI/ARB prescribed? - Yes For EF <40%, Aldosterone Antagonist (Spironolactone or Eplerenone) prescribed? - Not Applicable (EF >/= 01%) Cardiac Rehab Phase II ordered (including medically managed patients)? - Yes     _____________  Discharge Vitals Blood pressure (!) 122/92, pulse 72, temperature (!) 97.5 F (36.4 C), temperature source Oral, resp. rate 16, height '5\' 9"'$  (1.753 m), weight 92.2 kg, SpO2 95 %.  Filed Weights   06/16/22 1303  Weight: 92.2 kg    Labs & Radiologic Studies    CBC Recent Labs    06/15/22 0755 06/16/22 0644  WBC 5.9 6.2  NEUTROABS 3.9  --   HGB 16.1 14.6  HCT 45.6 40.4  MCV 89.4 88.0  PLT 159 749*   Basic Metabolic Panel Recent Labs    06/16/22 0644 06/17/22 0208  NA 136 135  K 3.7 4.0  CL 104 103  CO2 23 23  GLUCOSE 128* 140*  BUN 25* 30*  CREATININE 1.67* 2.14*  CALCIUM 10.1 10.6*  MG  --  1.9   Liver Function Tests Recent Labs    06/15/22 0755  AST 22  ALT 27  ALKPHOS 56  BILITOT 0.9  PROT 6.6  ALBUMIN 3.8   No results for input(s): "LIPASE", "AMYLASE" in the last 72 hours. High  Sensitivity Troponin:   Recent Labs  Lab 06/15/22 0755 06/15/22 1032  TROPONINIHS 8 661*    BNP Invalid input(s): "POCBNP" D-Dimer No results for input(s): "DDIMER" in the last 72 hours. Hemoglobin A1C Recent Labs    06/15/22 0755  HGBA1C 6.7*   Fasting Lipid Panel Recent Labs    06/15/22 0755  CHOL 149  HDL 30*  LDLCALC 45  TRIG 372*  CHOLHDL 5.0   Thyroid Function Tests No results for input(s): "TSH", "T4TOTAL", "T3FREE", "THYROIDAB" in the last 72 hours.  Invalid input(s): "FREET3" _____________  ECHOCARDIOGRAM COMPLETE  Result Date: 06/15/2022    ECHOCARDIOGRAM REPORT   Patient Name:   Garrett MOURE Sr. Date of Exam: 06/15/2022 Medical Rec #:  [id number]  Height:       69.5 in Accession #:    [id number]         Weight:       203.1 lb Date of Birth:  November 01, 1941          BSA:          2.090 m Patient Age:    16 years           BP:           109/72 mmHg Patient Gender: M                  HR:           62 bpm. Exam Location:  Inpatient Procedure: 2D Echo, Cardiac Doppler and Color Doppler Indications:    Inferior STEMI  History:        Patient has prior history of Echocardiogram examinations. CHF,                 CAD, Cardiac cath 06/16/22 and Prior CABG; Arrythmias:Atrial                 Fibrillation.  Sonographer:    Merrie Roof RDCS Referring Phys: [place]  1. Left ventricular ejection fraction, by estimation, is 55 to 60%. The left ventricle has normal function. The left ventricle demonstrates global hypokinesis. There is mild concentric left ventricular hypertrophy. Left ventricular diastolic parameters are indeterminate.  2. Right ventricular systolic function is mildly reduced. The right ventricular size is normal.  3. Left atrial size was moderately dilated.  4. Right atrial size was moderately dilated.  5. The mitral valve is normal in structure. No evidence of mitral valve regurgitation. No evidence of mitral stenosis.  6. Tricuspid valve  regurgitation is moderate.  7. The aortic valve is normal in structure. Aortic valve regurgitation is not visualized. No aortic stenosis is present.  8. Aneurysm of the ascending aorta, measuring 44 mm. There is mild dilatation of the aortic root, measuring 39 mm.  9. The inferior vena cava is normal in size with greater than 50% respiratory variability, suggesting right atrial pressure of 3 mmHg. FINDINGS  Left Ventricle: Left ventricular ejection fraction, by estimation, is 55 to 60%. The left ventricle has normal function. The left ventricle demonstrates global hypokinesis. The left ventricular internal cavity size was normal in size. There is mild concentric left ventricular hypertrophy. Left ventricular diastolic parameters are indeterminate.  LV Wall Scoring: The basal inferolateral segment and basal inferior segment are hypokinetic. The entire anterior wall, antero-lateral wall, mid and distal lateral wall, entire septum, entire apex, and mid and distal inferior wall are normal. Right Ventricle: The right ventricular size is normal. No increase in right ventricular wall thickness. Right ventricular systolic function is mildly reduced. Left Atrium: Left atrial size was moderately dilated. Right Atrium: Right atrial size was moderately dilated. Pericardium: There is no evidence of pericardial effusion. Presence of epicardial fat layer. Mitral Valve: The mitral valve is normal in structure. No evidence of mitral valve regurgitation. No evidence of mitral valve stenosis. Tricuspid Valve: The tricuspid valve is normal in structure. Tricuspid valve regurgitation is moderate . No evidence of tricuspid stenosis. Aortic Valve: The aortic valve is normal in structure. Aortic valve regurgitation is not visualized. No aortic stenosis is present. Pulmonic Valve: The pulmonic valve was normal in structure. Pulmonic valve regurgitation is trivial. No evidence of pulmonic stenosis. Aorta: The aortic root is normal in size  and structure. There  is mild dilatation of the aortic root, measuring 39 mm. There is an aneurysm involving the ascending aorta measuring 44 mm. Venous: The inferior vena cava is normal in size with greater than 50% respiratory variability, suggesting right atrial pressure of 3 mmHg. IAS/Shunts: No atrial level shunt detected by color flow Doppler.  LEFT VENTRICLE PLAX 2D LVIDd:         4.90 cm LVIDs:         3.90 cm LV PW:         1.20 cm LV IVS:        1.10 cm LVOT diam:     2.00 cm LV SV:         43 LV SV Index:   20 LVOT Area:     3.14 cm  RIGHT VENTRICLE RV Basal diam:  3.80 cm RV S prime:     5.48 cm/s TAPSE (M-mode): 0.9 cm LEFT ATRIUM              Index        RIGHT ATRIUM           Index LA diam:        4.50 cm  2.15 cm/m   RA Area:     25.90 cm LA Vol (A2C):   112.0 ml 53.59 ml/m  RA Volume:   90.40 ml  43.26 ml/m LA Vol (A4C):   80.0 ml  38.28 ml/m LA Biplane Vol: 98.7 ml  47.23 ml/m  AORTIC VALVE LVOT Vmax:   63.90 cm/s LVOT Vmean:  40.900 cm/s LVOT VTI:    0.136 m  AORTA Ao Root diam: 3.90 cm Ao Asc diam:  4.40 cm TRICUSPID VALVE TR Peak grad:   29.8 mmHg TR Vmax:        273.00 cm/s  SHUNTS Systemic VTI:  0.14 m Systemic Diam: 2.00 cm Kardie Tobb DO Electronically signed by Berniece Salines DO Signature Date/Time: 06/15/2022/1:26:59 PM    Final    CARDIAC CATHETERIZATION  Result Date: 06/15/2022 1.  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  Status post CABG with continued patency of the LIMA to LAD, saphenous vein graft to acute marginal, and saphenous vein graft to PDA with extensive diffuse degenerative changes. 3.  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  Acute occlusion of the saphenous vein graft to left posterolateral branch with extensive degenerative changes and unsuccessful PCI due to inability to restore TIMI-3  flow in an extensively degenerated graft 5.  Normal LVEDP Plan: Patient loaded with clopidogrel 600 mg prior to the procedure, would continue at least 3 months and he will need to delay his planned back surgery for this duration.  Resume apixaban tomorrow morning.  Check 2D echocardiogram.  Post MI CV-ICU care, anticipate hospital discharge in 48 hours if no complications arise.   DG Chest Port 1 View  Result Date: 06/15/2022 CLINICAL DATA:  STEMI EXAM: PORTABLE CHEST 1 VIEW COMPARISON:  Chest x-ray dated December 04, 2018 FINDINGS: Cardiac and mediastinal contours are within normal limits for technique. Prior median sternotomy and CABG. Mild left basilar opacity which is likely due to atelectasis. No focal consolidation. No large pleural effusion or evidence of pneumothorax. IMPRESSION: No acute cardiopulmonary abnormality. Electronically Signed   By: Yetta Glassman M.D.   On: 06/15/2022 10:37   MYOCARDIAL PERFUSION  IMAGING  Result Date: 05/31/2022   The study is normal. The study is low risk.   No ST deviation was noted.   Left ventricular function is normal. Nuclear stress EF: 61 %. The left ventricular ejection fraction is normal (55-65%). End diastolic cavity size is normal.   Prior study available for comparison from 03/10/2020.   ECHOCARDIOGRAM COMPLETE  Result Date: 05/31/2022    ECHOCARDIOGRAM REPORT   Patient Name:   Garrett LIDDY Sr. Date of Exam: 05/31/2022 Medical Rec #:  [id number]          Height:       69.0 in Accession #:    [id number]         Weight:       199.0 lb Date of Birth:  11/11/1941          BSA:          2.062 m Patient Age:    89 years           BP:           138/87 mmHg Patient Gender: M                  HR:           81 bpm. Exam Location:  [place] Procedure: 2D Echo, 3D Echo, Cardiac Doppler and Color Doppler Indications:    Z01.810 Pre-operative examination  History:        Patient has prior history of Echocardiogram examinations, most                 recent  10/16/2018. CHF, CAD, Prior CABG, AAA, Arrythmias:Atrial                 Fibrillation; Risk Factors:CKD.  Sonographer:    Marygrace Drought RCS Referring Phys: [id number] [place]  1. Left ventricular ejection fraction, by estimation, is 55 to 60%. The left ventricle has normal function. The left ventricle has no regional wall motion abnormalities. There is mild left ventricular hypertrophy. Left ventricular diastolic parameters are indeterminate.  2. Right ventricular systolic function is mildly reduced. The right ventricular size is mildly enlarged. There is normal pulmonary artery systolic pressure.  3. Left atrial size was moderately dilated.  4. Right atrial size was moderately dilated.  5. The mitral valve is normal in structure. Trivial mitral valve regurgitation.  6. Tricuspid valve regurgitation is mild to moderate.  7. The aortic valve is tricuspid. Aortic valve regurgitation is not visualized.  8. Small ascending aortic aneurysm 4.3 cm. Aortic dilatation noted.  9. The inferior vena cava is normal in size with greater than 50% respiratory variability, suggesting right atrial pressure of 3 mmHg. FINDINGS  Left Ventricle: Left ventricular ejection fraction, by estimation, is 55 to 60%. The left ventricle has normal function. The left ventricle has no regional wall motion abnormalities. The left ventricular internal cavity size was normal in size. There is  mild left ventricular hypertrophy. Abnormal (paradoxical) septal motion consistent with post-operative status. Left ventricular diastolic parameters are indeterminate. Right Ventricle: The right ventricular size is mildly enlarged. Right vetricular wall thickness was not well visualized. Right ventricular systolic function is mildly reduced. There is normal pulmonary artery systolic pressure. The tricuspid regurgitant velocity is 2.43 m/s, and with an assumed right atrial pressure of 3 mmHg, the estimated right ventricular systolic pressure is  68.0 mmHg. Left Atrium: Left atrial size was moderately dilated. Right Atrium: Right atrial size was moderately dilated. Pericardium:  There is no evidence of pericardial effusion. Mitral Valve: The mitral valve is normal in structure. Trivial mitral valve regurgitation. Tricuspid Valve: The tricuspid valve is normal in structure. Tricuspid valve regurgitation is mild to moderate. Aortic Valve: The aortic valve is tricuspid. Aortic valve regurgitation is not visualized. Pulmonic Valve: The pulmonic valve was normal in structure. Pulmonic valve regurgitation is mild. Aorta: Small ascending aortic aneurysm 4.3 cm. Aortic dilatation noted. Venous: The inferior vena cava is normal in size with greater than 50% respiratory variability, suggesting right atrial pressure of 3 mmHg. IAS/Shunts: No atrial level shunt detected by color flow Doppler.  LEFT VENTRICLE PLAX 2D LVIDd:         4.80 cm   Diastology LVIDs:         3.70 cm   LV e' medial:    12.00 cm/s LV PW:         1.10 cm   LV E/e' medial:  8.4 LV IVS:        1.00 cm   LV e' lateral:   15.00 cm/s LVOT diam:     2.20 cm   LV E/e' lateral: 6.8 LV SV:         52 LV SV Index:   25 LVOT Area:     3.80 cm                           3D Volume EF:                          3D EF:        56 %                          LV EDV:       133 ml                          LV ESV:       59 ml                          LV SV:        74 ml RIGHT VENTRICLE RV Basal diam:  4.20 cm RV Mid diam:    3.30 cm RV S prime:     7.07 cm/s TAPSE (M-mode): 1.6 cm RVSP:           26.6 mmHg LEFT ATRIUM             Index        RIGHT ATRIUM           Index LA diam:        4.60 cm 2.23 cm/m   RA Pressure: 3.00 mmHg LA Vol (A2C):   82.3 ml 39.92 ml/m  RA Area:     21.80 cm LA Vol (A4C):   64.8 ml 31.43 ml/m  RA Volume:   62.70 ml  30.41 ml/m LA Biplane Vol: 75.1 ml 36.43 ml/m  AORTIC VALVE LVOT Vmax:   75.00 cm/s LVOT Vmean:  49.950 cm/s LVOT VTI:    0.138 m  AORTA Ao Root diam: 3.90 cm Ao Asc diam:   4.40 cm MITRAL VALVE                TRICUSPID VALVE MV Area (PHT):  TR Peak grad:   23.6 mmHg MV Decel Time:              TR Vmax:        243.00 cm/s MV E velocity: 101.25 cm/s  Estimated RAP:  3.00 mmHg                             RVSP:           26.6 mmHg                              SHUNTS                             Systemic VTI:  0.14 m                             Systemic Diam: 2.20 cm Phineas Inches Electronically signed by Phineas Inches Signature Date/Time: 05/31/2022/9:21:01 AM    Final     Disposition   Pt is being discharged home today in good condition.  Follow-up Plans & Appointments     Discharge Instructions     Amb Referral to Cardiac Rehabilitation   Complete by: As directed    Diagnosis:  PTCA STEMI     After initial evaluation and assessments completed: Virtual Based Care may be provided alone or in conjunction with Phase 2 Cardiac Rehab based on patient barriers.: Yes       Discharge Medications   Allergies as of 06/17/2022       Reactions   Ace Inhibitors Cough   Crestor [rosuvastatin] Other (See Comments)   Muscle aches   Nitroglycerin Other (See Comments)   Very pronounced lowered BP with IV nitro for caths   Quinolones Other (See Comments)   Aortic root enlargement        Medication List     STOP taking these medications    amLODipine 10 MG tablet Commonly known as: NORVASC   esomeprazole 20 MG capsule Commonly known as: NEXIUM   rosuvastatin 10 MG tablet Commonly known as: CRESTOR       TAKE these medications    Accu-Chek Guide test strip Generic drug: glucose blood USE TWICE DAILY   acetaminophen 650 MG CR tablet Commonly known as: TYLENOL Take 650 mg by mouth every 8 (eight) hours as needed for pain.   allopurinol 300 MG tablet Commonly known as: ZYLOPRIM Take 300 mg by mouth every other day. Take in the evening - on even days of the month   Aspercreme Lidocaine 4 % Generic drug: lidocaine Place 1 patch onto the  skin daily as needed (pain).   aspirin 81 MG chewable tablet Chew 1 tablet (81 mg total) by mouth daily. Take for 30 days and then stop. Start taking on: June 18, 2022   atorvastatin 40 MG tablet Commonly known as: LIPITOR Take 1 tablet (40 mg total) by mouth daily. Start taking on: June 18, 2022   clonazePAM 1 MG tablet Commonly known as: KLONOPIN Take 0.5 mg by mouth at bedtime.   clopidogrel 75 MG tablet Commonly known as: PLAVIX TAKE 1 TABLET BY MOUTH  DAILY   Coenzyme Q10 300 MG Caps Take 300 mg by mouth daily.   Eliquis 5 MG Tabs tablet Generic drug: apixaban TAKE 1 TABLET BY MOUTH  TWICE A DAY What changed: how much to take   ezetimibe 10 MG tablet Commonly known as: ZETIA TAKE 1 TABLET BY MOUTH  DAILY   Farxiga 10 MG Tabs tablet Generic drug: dapagliflozin propanediol Take 10 mg by mouth daily.   fenofibrate micronized 200 MG capsule Commonly known as: LOFIBRA Take 1 capsule (200 mg total) by mouth daily before breakfast.   fluorouracil 5 % cream Commonly known as: EFUDEX Apply 1 Application topically daily as needed (cancer spots). Use as directed   fluticasone 50 MCG/ACT nasal spray Commonly known as: FLONASE Place 2 sprays into both nostrils daily as needed for allergies.   glipiZIDE 5 MG tablet Commonly known as: GLUCOTROL Take 0.5 tablets (2.5 mg total) by mouth daily before breakfast AND 1 tablet (5 mg total) daily before supper.   levothyroxine 175 MCG tablet Commonly known as: SYNTHROID Take 175 mcg by mouth daily before breakfast.   loratadine 10 MG tablet Commonly known as: CLARITIN Take 10 mg by mouth at bedtime.   metoprolol tartrate 25 MG tablet Commonly known as: LOPRESSOR TAKE 1 TABLET BY MOUTH  TWICE DAILY   olmesartan-hydrochlorothiazide 40-25 MG tablet Commonly known as: BENICAR HCT TAKE 1 TABLET BY MOUTH  DAILY   pantoprazole 40 MG tablet Commonly known as: PROTONIX TAKE 1 TABLET BY MOUTH  DAILY   PreserVision AREDS 2  Caps Take 1 capsule by mouth 2 (two) times daily.   SYSTANE OP Place 1 drop into both eyes daily as needed (dry/irritated eyes).   tamsulosin 0.4 MG Caps capsule Commonly known as: FLOMAX Take 0.4 mg by mouth every other day. Take in the evening - on even days of the month   triamcinolone cream 0.1 % Commonly known as: KENALOG Apply 1 application  topically daily as needed (rash).   TURMERIC PO Take 2 tablets by mouth in the morning and at bedtime.   Vascepa 1 g capsule Generic drug: icosapent Ethyl TAKE 2 CAPSULES BY MOUTH 2 TIMES DAILY.   VITAMIN B-12 PO Take 2,500 mcg by mouth daily.   Vitamin D3 50 MCG (2000 UT) Tabs Take 2,000 Units by mouth daily.           Outstanding Labs/Studies   None   Duration of Discharge Encounter   Greater than 30 minutes including physician time.  Signed, Rosaria Ferries, PA-C 06/17/2022, 11:05 AM

## 2022-06-17 NOTE — Plan of Care (Signed)
  Problem: Coping: Goal: Ability to adjust to condition or change in health will improve Outcome: Progressing   Problem: Fluid Volume: Goal: Ability to maintain a balanced intake and output will improve Outcome: Progressing   

## 2022-06-18 ENCOUNTER — Encounter (HOSPITAL_COMMUNITY): Payer: Self-pay | Admitting: Cardiovascular Disease

## 2022-06-18 DIAGNOSIS — I213 ST elevation (STEMI) myocardial infarction of unspecified site: Secondary | ICD-10-CM | POA: Diagnosis not present

## 2022-06-18 DIAGNOSIS — N183 Chronic kidney disease, stage 3 unspecified: Secondary | ICD-10-CM

## 2022-06-18 HISTORY — DX: Chronic kidney disease, stage 3 unspecified: N18.30

## 2022-06-18 LAB — BASIC METABOLIC PANEL
Anion gap: 9 (ref 5–15)
BUN: 27 mg/dL — ABNORMAL HIGH (ref 8–23)
CO2: 23 mmol/L (ref 22–32)
Calcium: 10.4 mg/dL — ABNORMAL HIGH (ref 8.9–10.3)
Chloride: 102 mmol/L (ref 98–111)
Creatinine, Ser: 2 mg/dL — ABNORMAL HIGH (ref 0.61–1.24)
GFR, Estimated: 33 mL/min — ABNORMAL LOW (ref >60)
Glucose, Bld: 165 mg/dL — ABNORMAL HIGH (ref 70–99)
Potassium: 4.5 mmol/L (ref 3.5–5.1)
Sodium: 134 mmol/L — ABNORMAL LOW (ref 135–145)

## 2022-06-18 LAB — GLUCOSE, CAPILLARY
Glucose-Capillary: 173 mg/dL — ABNORMAL HIGH (ref 70–99)
Glucose-Capillary: 140 mg/dL — ABNORMAL HIGH (ref 70–99)

## 2022-06-18 NOTE — Discharge Instructions (Addendum)

## 2022-06-18 NOTE — Progress Notes (Signed)
Progress Note  Patient Name: Garrett HAYS Sr. Date of Encounter: 06/18/2022  Primary Cardiologist:   Candee Furbish, MD   Subjective   No chest pain.  No SOB.  Ready to go home.   Inpatient Medications    Scheduled Meds:  allopurinol  300 mg Oral QODAY   apixaban  5 mg Oral BID   aspirin  81 mg Oral Daily   atorvastatin  40 mg Oral Daily   clonazePAM  0.5 mg Oral QHS   clopidogrel  75 mg Oral Daily   ezetimibe  10 mg Oral Daily   fenofibrate  160 mg Oral Daily   icosapent Ethyl  2 g Oral BID   insulin aspart  0-15 Units Subcutaneous TID WC   levothyroxine  175 mcg Oral QAC breakfast   metoprolol tartrate  25 mg Oral BID   pantoprazole  20 mg Oral Daily   sodium chloride flush  3 mL Intravenous Q12H   Continuous Infusions:  sodium chloride Stopped (06/16/22 0539)   sodium chloride     PRN Meds: sodium chloride, acetaminophen, ondansetron (ZOFRAN) IV, mouth rinse, sodium chloride flush   Vital Signs    Vitals:   06/17/22 1415 06/17/22 1954 06/17/22 2335 06/18/22 0508  BP: 113/69 (!) 140/97 110/84 109/84  Pulse: 83 78  75  Resp: '17 20  20  '$ Temp: 98.2 F (36.8 C) 97.6 F (36.4 C) 98.1 F (36.7 C) 97.7 F (36.5 C)  TempSrc: Oral Oral Oral Oral  SpO2:  97% 97% 98%  Weight:      Height:       No intake or output data in the 24 hours ending 06/18/22 1201 Filed Weights   06/16/22 1303  Weight: 92.2 kg    Telemetry    Atrial fib with rare ectopy - Personally Reviewed  ECG    NA - Personally Reviewed  Physical Exam   GEN: No acute distress.   Neck: No  JVD Cardiac: Irregular RR, no murmurs, rubs, or gallops.  Respiratory: Clear  to auscultation bilaterally. GI: Soft, nontender, non-distended  MS: No  edema; No deformity. Neuro:  Nonfocal  Psych: Normal affect   Labs    Chemistry Recent Labs  Lab 06/15/22 0755 06/16/22 0644 06/17/22 0208 06/18/22 0808  NA 136 136 135 134*  K 3.7 3.7 4.0 4.5  CL 101 104 103 102  CO2 '23 23 23 23  '$ GLUCOSE  201* 128* 140* 165*  BUN 36* 25* 30* 27*  CREATININE 1.91* 1.67* 2.14* 2.00*  CALCIUM 10.4* 10.1 10.6* 10.4*  PROT 6.6  --   --   --   ALBUMIN 3.8  --   --   --   AST 22  --   --   --   ALT 27  --   --   --   ALKPHOS 56  --   --   --   BILITOT 0.9  --   --   --   GFRNONAA 35* 41* 31* 33*  ANIONGAP '12 9 9 9     '$ Hematology Recent Labs  Lab 06/13/22 0855 06/15/22 0755 06/16/22 0644  WBC 5.5 5.9 6.2  RBC 5.33 5.10 4.59  HGB 16.6 16.1 14.6  HCT 48.4 45.6 40.4  MCV 90.8 89.4 88.0  MCH 31.1 31.6 31.8  MCHC 34.3 35.3 36.1*  RDW 13.1 13.1 13.1  PLT 166 159 135*    Cardiac EnzymesNo results for input(s): "TROPONINI" in the last 168 hours. No results for  input(s): "TROPIPOC" in the last 168 hours.   BNPNo results for input(s): "BNP", "PROBNP" in the last 168 hours.   DDimer No results for input(s): "DDIMER" in the last 168 hours.   Radiology    No results found.  Cardiac Studies   Cath 06/15/2022  Diagnostic Dominance: Right  Intervention        Patient Profile     81 y.o. male with PMH of CABG, T2DM, HTN, HL, AAA, PAF, CKD3 was admitted 08/03 with inferolateral STEMI after holding plavix and Eliquis now s/p unsuccessful PCI of VG to OM2.   Assessment & Plan      STEMI:  Continue triple therapy for 1 month with ASA/plavix/apixiban then drop ASA.    HTN:  BP well controlled.  Continue meds as on MAR.   HL:  Placed on high dose Lipitor although he has not tolerated higher dose Crestor.  Follow as an out patient and he will let us know how he does.   AAA s/p EVAR:  Cont Plavix  T2DM: Held with increased creat.  Can consider resuming as an outpatient after follow up.    CKD IIIb: AKI.  Creat down today.  On admisison was 1.91.  Follow as an outpatient.    NSVT:  No sustained arrhythmias over night.     DM: He will follow with his endocrinologist to consider when to restart [place].    PAF:  Reported as paroxysmal.  However, I don't see the last time and  EKG was sinus.  Regardless he is on DOAC and has good rate control.    For questions or updates, please contact [place] Please consult [email or web address] for contact info under Cardiology/STEMI.   Signed, Minus Breeding, MD  06/18/2022, 12:01 PM

## 2022-06-18 NOTE — Discharge Summary (Signed)
Discharge Summary    Patient ID: LOWELL MAKARA Sr. MRN: [id number]; DOB: 1941/05/21  Admit date: 06/15/2022 Discharge date: 06/18/2022  PCP:  Antony Contras, MD   [facility] Providers Cardiologist:  Candee Furbish, MD     Discharge Diagnoses    Principal Problem:   STEMI (ST elevation myocardial infarction) [facility]) Active Problems:   Hypertensive heart disease without CHF   Hyperlipidemia   Paroxysmal atrial fibrillation ([place])   Type 2 diabetes mellitus with stage 3b chronic kidney disease, without long-term current use of insulin ([place])   CKD (chronic kidney disease), stage III [facility])  Diagnostic Studies/Procedures    Cath: 06/15/22  1.  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  Status post CABG with continued patency of the LIMA to LAD, saphenous vein graft to acute marginal, and saphenous vein graft to PDA with extensive diffuse degenerative changes. 3.  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  Acute occlusion of the saphenous vein graft to left posterolateral branch with extensive degenerative changes and unsuccessful PCI due to inability to restore TIMI-3 flow in an extensively degenerated graft 5.  Normal LVEDP   Plan: Patient loaded with clopidogrel 600 mg prior to the procedure, would continue at least 3 months and he will need to delay his planned back surgery for this duration.  Resume apixaban tomorrow morning.  Check 2D echocardiogram.  Post MI CV-ICU care, anticipate hospital discharge in 48 hours if no complications arise.  Diagnostic Dominance: Right  Intervention   Echo: 06/15/22  IMPRESSIONS     1. Left ventricular ejection fraction, by estimation, is 55 to 60%. The  left ventricle has normal function. The left ventricle demonstrates global  hypokinesis. There is mild concentric left  ventricular hypertrophy. Left  ventricular diastolic parameters  are indeterminate.   2. Right ventricular systolic function is mildly reduced. The right  ventricular size is normal.   3. Left atrial size was moderately dilated.   4. Right atrial size was moderately dilated.   5. The mitral valve is normal in structure. No evidence of mitral valve  regurgitation. No evidence of mitral stenosis.   6. Tricuspid valve regurgitation is moderate.   7. The aortic valve is normal in structure. Aortic valve regurgitation is  not visualized. No aortic stenosis is present.   8. Aneurysm of the ascending aorta, measuring 44 mm. There is mild  dilatation of the aortic root, measuring 39 mm.   9. The inferior vena cava is normal in size with greater than 50%  respiratory variability, suggesting right atrial pressure of 3 mmHg.   FINDINGS   Left Ventricle: Left ventricular ejection fraction, by estimation, is 55  to 60%. The left ventricle has normal function. The left ventricle  demonstrates global hypokinesis. The left ventricular internal cavity size  was normal in size. There is mild  concentric left ventricular hypertrophy. Left ventricular diastolic  parameters are indeterminate.      LV Wall Scoring:  The basal inferolateral segment and basal inferior segment are  hypokinetic.  The entire anterior wall, antero-lateral wall, mid and distal lateral  wall,  entire septum, entire apex, and mid and distal inferior wall are normal.   Right Ventricle: The right ventricular size is normal. No increase in  right ventricular wall thickness. Right ventricular systolic function is  mildly reduced.  Left Atrium: Left atrial size was moderately dilated.   Right Atrium: Right atrial size was moderately dilated.   Pericardium: There is no evidence of pericardial effusion. Presence of  epicardial fat layer.   Mitral Valve: The mitral valve is normal in structure. No evidence of  mitral valve  regurgitation. No evidence of mitral valve stenosis.   Tricuspid Valve: The tricuspid valve is normal in structure. Tricuspid  valve regurgitation is moderate . No evidence of tricuspid stenosis.   Aortic Valve: The aortic valve is normal in structure. Aortic valve  regurgitation is not visualized. No aortic stenosis is present.   Pulmonic Valve: The pulmonic valve was normal in structure. Pulmonic valve  regurgitation is trivial. No evidence of pulmonic stenosis.   Aorta: The aortic root is normal in size and structure. There is mild  dilatation of the aortic root, measuring 39 mm. There is an aneurysm  involving the ascending aorta measuring 44 mm.   Venous: The inferior vena cava is normal in size with greater than 50%  respiratory variability, suggesting right atrial pressure of 3 mmHg.   IAS/Shunts: No atrial level shunt detected by color flow Doppler.  _____________   History of Present Illness     Harriet Bollen. is a 81 y.o. male with  a history of 5 vessel CABG in 2010 when he was treated with a LIMA to LAD, saphenous vein graft to OM 2, saphenous vein graft to RV marginal branch of the RCA, saphenous vein graft to PDA, and saphenous vein graft to distal posterolateral branch of the RCA.  He has been anticoagulated with apixaban and has been maintained on clopidogrel with history of EVAR and CAD.  Clopidogrel and apixaban have been on hold for back surgery.  The patient woke up this morning with substernal chest pressure and came by private vehicle to the emergency department where his EKG showed his chronic right bundle branch block but new inferior ST segment elevation.  A code STEMI was called.  At the time of of evaluation was continuing to experience mild substernal and left-sided discomfort.  Stated that his pain that morning felt like a pressure across the center of his chest as well as pain in the left side that felt like "heartburn."  He took some antacid medication but  his symptoms did not improve.  There was no associated dyspnea, diaphoresis, nausea, or vomiting.  He had not experienced pain like this before.  Stated with his old MI he felt discomfort in his jaw which he was not currently experiencing. He was brought emergently to the cath lab for emergent cardiac catheterization.   Hospital Course     STEMI CAD s/p PCI -- Underwent cardiac catheterization noted above with severe multivessel CAD with CTO of native LAD, native circumflex and subtotal occlusion of mid RCA, patent LIMA to LAD, SVG to OM and SVG to PDA with extensive diffuse degenerative changes.  Acute occlusion of the SVG to left posterolateral branch with unsuccessful PCI due to inability to restore TIMI-3 flow.  Loaded with Plavix 600 mg prior to procedure, recommendations to continue for at least 3 months.  Per Dr. Ali Lowe plan for triple therapy with ASA, plavix, Eliquis for 1 month.  Then stop aspirin.  Recommendations for treatment for 3 months prior to considering back surgery.  Seen by cardiac rehab.  No recurrent chest pain.  Follow-up echocardiogram showed LVEF of 55 to 60% with global hypokinesis, LVH, mildly reduced RV function, normal RV size,  moderate biatrial enlargement with no significant valvular disease. --Continue aspirin, Plavix, Eliquis, atorvastatin 40 mg daily, metoprolol 25 mg twice daily  HTN: Stable --Continue metoprolol 25 mg twice daily, holding home ARB with elevated creatinine  HLD: LDL 45, HDL 30 --Started on atorvastatin 40 mg daily, though has had issues with statins in the past -- continue Zetia '10mg'$  daily, along with Vascepa --Consider LFT/FLP in 8 weeks if tolerating statin  CKD stage IIIb AKI --Baseline creatinine around 1.8, peaked at 2.14, improved to 2 prior to discharge. --BMP at follow-up  Diabetes: Hemoglobin A1c 6.7 --Home Farxiga held at discharge with elevated creatinine -- continue glipizide   Paroxysmal atrial fibrillation: Resumed on home  DOAC, Eliquis 5 mg twice daily prior to discharge.  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 Percival Spanish and deemed stable for discharge home.  Follow-up in the office arranged.  Medication sent to patient's pharmacy of choice.  Did the patient have an acute coronary syndrome (MI, NSTEMI, STEMI, etc) this admission?:  Yes                               AHA/ACC Clinical Performance & Quality Measures: Aspirin prescribed? - Yes ADP Receptor Inhibitor (Plavix/Clopidogrel, Brilinta/Ticagrelor or Effient/Prasugrel) prescribed (includes medically managed patients)? - Yes Beta Blocker prescribed? - Yes High Intensity Statin (Lipitor 40-'80mg'$  or Crestor 20-'40mg'$ ) prescribed? - Yes EF assessed during THIS hospitalization? - Yes For EF <40%, was ACEI/ARB prescribed? - Not Applicable (EF >/= 80%) For EF <40%, Aldosterone Antagonist (Spironolactone or Eplerenone) prescribed? - Not Applicable (EF >/= 99%) Cardiac Rehab Phase II ordered (including medically managed patients)? - Yes       The patient will be scheduled for a TOC follow up appointment in 10-14 days.  A message has been sent to the [facility] and Scheduling Pool at the office where the patient should be seen for follow up.  _____________  Discharge Vitals Blood pressure 109/84, pulse 75, temperature 97.7 F (36.5 C), temperature source Oral, resp. rate 20, height '5\' 9"'$  (1.753 m), weight 92.2 kg, SpO2 98 %.  Filed Weights   06/16/22 1303  Weight: 92.2 kg    Labs & Radiologic Studies    CBC Recent Labs    06/16/22 0644  WBC 6.2  HGB 14.6  HCT 40.4  MCV 88.0  PLT 833*   Basic Metabolic Panel Recent Labs    06/17/22 0208 06/18/22 0808  NA 135 134*  K 4.0 4.5  CL 103 102  CO2 23 23  GLUCOSE 140* 165*  BUN 30* 27*  CREATININE 2.14* 2.00*  CALCIUM 10.6* 10.4*  MG 1.9  --    Liver Function Tests No results for input(s): "AST", "ALT", "ALKPHOS", "BILITOT", "PROT", "ALBUMIN" in the last 72 hours. No results for input(s):  "LIPASE", "AMYLASE" in the last 72 hours. High Sensitivity Troponin:   Recent Labs  Lab 06/15/22 0755 06/15/22 1032  TROPONINIHS 8 661*    BNP Invalid input(s): "POCBNP" D-Dimer No results for input(s): "DDIMER" in the last 72 hours. Hemoglobin A1C No results for input(s): "HGBA1C" in the last 72 hours. Fasting Lipid Panel No results for input(s): "CHOL", "HDL", "LDLCALC", "TRIG", "CHOLHDL", "LDLDIRECT" in the last 72 hours. Thyroid Function Tests No results for input(s): "TSH", "T4TOTAL", "T3FREE", "THYROIDAB" in the last 72 hours.  Invalid input(s): "FREET3" _____________  ECHOCARDIOGRAM COMPLETE  Result Date: 06/15/2022    ECHOCARDIOGRAM REPORT   Patient Name:  [name] Date of Exam: 06/15/2022 Medical Rec #:  [id number]          Height:       69.5 in Accession #:    [id number]         Weight:       203.1 lb Date of Birth:  August 20, 1941          BSA:          2.090 m Patient Age:    37 years           BP:           109/72 mmHg Patient Gender: M                  HR:           62 bpm. Exam Location:  Inpatient Procedure: 2D Echo, Cardiac Doppler and Color Doppler Indications:    Inferior STEMI  History:        Patient has prior history of Echocardiogram examinations. CHF,                 CAD, Cardiac cath 06/16/22 and Prior CABG; Arrythmias:Atrial                 Fibrillation.  Sonographer:    Merrie Roof RDCS Referring Phys: [place]  1. Left ventricular ejection fraction, by estimation, is 55 to 60%. The left ventricle has normal function. The left ventricle demonstrates global hypokinesis. There is mild concentric left ventricular hypertrophy. Left ventricular diastolic parameters are indeterminate.  2. Right ventricular systolic function is mildly reduced. The right ventricular size is normal.  3. Left atrial size was moderately dilated.  4. Right atrial size was moderately dilated.  5. The mitral valve is normal in structure. No evidence of mitral valve  regurgitation. No evidence of mitral stenosis.  6. Tricuspid valve regurgitation is moderate.  7. The aortic valve is normal in structure. Aortic valve regurgitation is not visualized. No aortic stenosis is present.  8. Aneurysm of the ascending aorta, measuring 44 mm. There is mild dilatation of the aortic root, measuring 39 mm.  9. The inferior vena cava is normal in size with greater than 50% respiratory variability, suggesting right atrial pressure of 3 mmHg. FINDINGS  Left Ventricle: Left ventricular ejection fraction, by estimation, is 55 to 60%. The left ventricle has normal function. The left ventricle demonstrates global hypokinesis. The left ventricular internal cavity size was normal in size. There is mild concentric left ventricular hypertrophy. Left ventricular diastolic parameters are indeterminate.  LV Wall Scoring: The basal inferolateral segment and basal inferior segment are hypokinetic. The entire anterior wall, antero-lateral wall, mid and distal lateral wall, entire septum, entire apex, and mid and distal inferior wall are normal. Right Ventricle: The right ventricular size is normal. No increase in right ventricular wall thickness. Right ventricular systolic function is mildly reduced. Left Atrium: Left atrial size was moderately dilated. Right Atrium: Right atrial size was moderately dilated. Pericardium: There is no evidence of pericardial effusion. Presence of epicardial fat layer. Mitral Valve: The mitral valve is normal in structure. No evidence of mitral valve regurgitation. No evidence of mitral valve stenosis. Tricuspid Valve: The tricuspid valve is normal in structure. Tricuspid valve regurgitation is moderate . No evidence of tricuspid stenosis. Aortic Valve: The aortic valve is normal in structure. Aortic valve regurgitation is not visualized. No aortic stenosis is present. Pulmonic Valve: The pulmonic valve was normal in structure.  Pulmonic valve regurgitation is trivial. No  evidence of pulmonic stenosis. Aorta: The aortic root is normal in size and structure. There is mild dilatation of the aortic root, measuring 39 mm. There is an aneurysm involving the ascending aorta measuring 44 mm. Venous: The inferior vena cava is normal in size with greater than 50% respiratory variability, suggesting right atrial pressure of 3 mmHg. IAS/Shunts: No atrial level shunt detected by color flow Doppler.  LEFT VENTRICLE PLAX 2D LVIDd:         4.90 cm LVIDs:         3.90 cm LV PW:         1.20 cm LV IVS:        1.10 cm LVOT diam:     2.00 cm LV SV:         43 LV SV Index:   20 LVOT Area:     3.14 cm  RIGHT VENTRICLE RV Basal diam:  3.80 cm RV S prime:     5.48 cm/s TAPSE (M-mode): 0.9 cm LEFT ATRIUM              Index        RIGHT ATRIUM           Index LA diam:        4.50 cm  2.15 cm/m   RA Area:     25.90 cm LA Vol (A2C):   112.0 ml 53.59 ml/m  RA Volume:   90.40 ml  43.26 ml/m LA Vol (A4C):   80.0 ml  38.28 ml/m LA Biplane Vol: 98.7 ml  47.23 ml/m  AORTIC VALVE LVOT Vmax:   63.90 cm/s LVOT Vmean:  40.900 cm/s LVOT VTI:    0.136 m  AORTA Ao Root diam: 3.90 cm Ao Asc diam:  4.40 cm TRICUSPID VALVE TR Peak grad:   29.8 mmHg TR Vmax:        273.00 cm/s  SHUNTS Systemic VTI:  0.14 m Systemic Diam: 2.00 cm Kardie Tobb DO Electronically signed by Berniece Salines DO Signature Date/Time: 06/15/2022/1:26:59 PM    Final    CARDIAC CATHETERIZATION  Result Date: 06/15/2022 1.  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  Status post CABG with continued patency of the LIMA to LAD, saphenous vein graft to acute marginal, and saphenous vein graft to PDA with extensive diffuse degenerative changes. 3.  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  Acute occlusion of the saphenous vein graft to left posterolateral branch with extensive  degenerative changes and unsuccessful PCI due to inability to restore TIMI-3 flow in an extensively degenerated graft 5.  Normal LVEDP Plan: Patient loaded with clopidogrel 600 mg prior to the procedure, would continue at least 3 months and he will need to delay his planned back surgery for this duration.  Resume apixaban tomorrow morning.  Check 2D echocardiogram.  Post MI CV-ICU care, anticipate hospital discharge in 48 hours if no complications arise.   DG Chest Port 1 View  Result Date: 06/15/2022 CLINICAL DATA:  STEMI EXAM: PORTABLE CHEST 1 VIEW COMPARISON:  Chest x-ray dated December 04, 2018 FINDINGS: Cardiac and mediastinal contours are within normal limits for technique. Prior median sternotomy and CABG. Mild left basilar opacity which is likely due to atelectasis. No focal consolidation. No large pleural effusion or evidence of pneumothorax. IMPRESSION:  No acute cardiopulmonary abnormality. Electronically Signed   By: Yetta Glassman M.D.   On: 06/15/2022 10:37   MYOCARDIAL PERFUSION IMAGING  Result Date: 05/31/2022   The study is normal. The study is low risk.   No ST deviation was noted.   Left ventricular function is normal. Nuclear stress EF: 61 %. The left ventricular ejection fraction is normal (55-65%). End diastolic cavity size is normal.   Prior study available for comparison from 03/10/2020.   ECHOCARDIOGRAM COMPLETE  Result Date: 05/31/2022    ECHOCARDIOGRAM REPORT   Patient Name:   ADOLPHE FORTUNATO Sr. Date of Exam: 05/31/2022 Medical Rec #:  [id number]          Height:       69.0 in Accession #:    [id number]         Weight:       199.0 lb Date of Birth:  12/08/1940          BSA:          2.062 m Patient Age:    19 years           BP:           138/87 mmHg Patient Gender: M                  HR:           81 bpm. Exam Location:  [place] Procedure: 2D Echo, 3D Echo, Cardiac Doppler and Color Doppler Indications:    Z01.810 Pre-operative examination  History:        Patient has prior  history of Echocardiogram examinations, most                 recent 10/16/2018. CHF, CAD, Prior CABG, AAA, Arrythmias:Atrial                 Fibrillation; Risk Factors:CKD.  Sonographer:    Marygrace Drought RCS Referring Phys: [id number] [place]  1. Left ventricular ejection fraction, by estimation, is 55 to 60%. The left ventricle has normal function. The left ventricle has no regional wall motion abnormalities. There is mild left ventricular hypertrophy. Left ventricular diastolic parameters are indeterminate.  2. Right ventricular systolic function is mildly reduced. The right ventricular size is mildly enlarged. There is normal pulmonary artery systolic pressure.  3. Left atrial size was moderately dilated.  4. Right atrial size was moderately dilated.  5. The mitral valve is normal in structure. Trivial mitral valve regurgitation.  6. Tricuspid valve regurgitation is mild to moderate.  7. The aortic valve is tricuspid. Aortic valve regurgitation is not visualized.  8. Small ascending aortic aneurysm 4.3 cm. Aortic dilatation noted.  9. The inferior vena cava is normal in size with greater than 50% respiratory variability, suggesting right atrial pressure of 3 mmHg. FINDINGS  Left Ventricle: Left ventricular ejection fraction, by estimation, is 55 to 60%. The left ventricle has normal function. The left ventricle has no regional wall motion abnormalities. The left ventricular internal cavity size was normal in size. There is  mild left ventricular hypertrophy. Abnormal (paradoxical) septal motion consistent with post-operative status. Left ventricular diastolic parameters are indeterminate. Right Ventricle: The right ventricular size is mildly enlarged. Right vetricular wall thickness was not well visualized. Right ventricular systolic function is mildly reduced. There is normal pulmonary artery systolic pressure. The tricuspid regurgitant velocity is 2.43 m/s, and with an assumed right atrial  pressure of 3 mmHg, the estimated right ventricular  systolic pressure is 13.0 mmHg. Left Atrium: Left atrial size was moderately dilated. Right Atrium: Right atrial size was moderately dilated. Pericardium: There is no evidence of pericardial effusion. Mitral Valve: The mitral valve is normal in structure. Trivial mitral valve regurgitation. Tricuspid Valve: The tricuspid valve is normal in structure. Tricuspid valve regurgitation is mild to moderate. Aortic Valve: The aortic valve is tricuspid. Aortic valve regurgitation is not visualized. Pulmonic Valve: The pulmonic valve was normal in structure. Pulmonic valve regurgitation is mild. Aorta: Small ascending aortic aneurysm 4.3 cm. Aortic dilatation noted. Venous: The inferior vena cava is normal in size with greater than 50% respiratory variability, suggesting right atrial pressure of 3 mmHg. IAS/Shunts: No atrial level shunt detected by color flow Doppler.  LEFT VENTRICLE PLAX 2D LVIDd:         4.80 cm   Diastology LVIDs:         3.70 cm   LV e' medial:    12.00 cm/s LV PW:         1.10 cm   LV E/e' medial:  8.4 LV IVS:        1.00 cm   LV e' lateral:   15.00 cm/s LVOT diam:     2.20 cm   LV E/e' lateral: 6.8 LV SV:         52 LV SV Index:   25 LVOT Area:     3.80 cm                           3D Volume EF:                          3D EF:        56 %                          LV EDV:       133 ml                          LV ESV:       59 ml                          LV SV:        74 ml RIGHT VENTRICLE RV Basal diam:  4.20 cm RV Mid diam:    3.30 cm RV S prime:     7.07 cm/s TAPSE (M-mode): 1.6 cm RVSP:           26.6 mmHg LEFT ATRIUM             Index        RIGHT ATRIUM           Index LA diam:        4.60 cm 2.23 cm/m   RA Pressure: 3.00 mmHg LA Vol (A2C):   82.3 ml 39.92 ml/m  RA Area:     21.80 cm LA Vol (A4C):   64.8 ml 31.43 ml/m  RA Volume:   62.70 ml  30.41 ml/m LA Biplane Vol: 75.1 ml 36.43 ml/m  AORTIC VALVE LVOT Vmax:   75.00 cm/s LVOT Vmean:   49.950 cm/s LVOT VTI:    0.138 m  AORTA Ao Root diam: 3.90 cm Ao Asc diam:  4.40 cm MITRAL VALVE  TRICUSPID VALVE MV Area (PHT):              TR Peak grad:   23.6 mmHg MV Decel Time:              TR Vmax:        243.00 cm/s MV E velocity: 101.25 cm/s  Estimated RAP:  3.00 mmHg                             RVSP:           26.6 mmHg                              SHUNTS                             Systemic VTI:  0.14 m                             Systemic Diam: 2.20 cm Phineas Inches Electronically signed by Phineas Inches Signature Date/Time: 05/31/2022/9:21:01 AM    Final     Disposition   Pt is being discharged home today in good condition.  Follow-up Plans & Appointments     Follow-up Information     Elgie Collard, PA-C Follow up on 06/30/2022.   Specialty: Cardiothoracic Surgery Why: at 1:30pm for your follow up appt with Dr. Marlou Porch' PA [facility] Contact information: [address] [place] [address] [phone]                Discharge Instructions     Amb Referral to Cardiac Rehabilitation   Complete by: As directed    Diagnosis:  PTCA STEMI     After initial evaluation and assessments completed: Virtual Based Care may be provided alone or in conjunction with Phase 2 Cardiac Rehab based on patient barriers.: Yes   Call MD for:  difficulty breathing, headache or visual disturbances   Complete by: As directed    Call MD for:  persistant dizziness or light-headedness   Complete by: As directed    Call MD for:  redness, tenderness, or signs of infection (pain, swelling, redness, odor or green/yellow discharge around incision site)   Complete by: As directed    Diet - low sodium heart healthy   Complete by: As directed    Discharge instructions   Complete by: As directed    Radial Site Care Refer to this sheet in the next few weeks. These instructions provide you with information on caring for yourself after your procedure. Your caregiver may also give you  more specific instructions. Your treatment has been planned according to current medical practices, but problems sometimes occur. Call your caregiver if you have any problems or questions after your procedure. HOME CARE INSTRUCTIONS You may shower the day after the procedure. Remove the bandage (dressing) and gently wash the site with plain soap and water. Gently pat the site dry.  Do not apply powder or lotion to the site.  Do not submerge the affected site in water for 3 to 5 days.  Inspect the site at least twice daily.  Do not flex or bend the affected arm for 24 hours.  No lifting over 5 pounds (2.3 kg) for 5 days after your procedure.  Do not drive home  if you are discharged the same day of the procedure. Have someone else drive you.  You may drive 24 hours after the procedure unless otherwise instructed by your caregiver.  What to expect: Any bruising will usually fade within 1 to 2 weeks.  Blood that collects in the tissue (hematoma) may be painful to the touch. It should usually decrease in size and tenderness within 1 to 2 weeks.  SEEK IMMEDIATE MEDICAL CARE IF: You have unusual pain at the radial site.  You have redness, warmth, swelling, or pain at the radial site.  You have drainage (other than a small amount of blood on the dressing).  You have chills.  You have a fever or persistent symptoms for more than 72 hours.  You have a fever and your symptoms suddenly get worse.  Your arm becomes pale, cool, tingly, or numb.  You have heavy bleeding from the site. Hold pressure on the site.   Plan for aspirin, Plavix and Eliquis for 1 month.  Then stop aspirin.   Increase activity slowly   Complete by: As directed        Discharge Medications   Allergies as of 06/18/2022       Reactions   Ace Inhibitors Cough   Crestor [rosuvastatin] Other (See Comments)   Muscle aches   Nitroglycerin Other (See Comments)   Very pronounced lowered BP with IV nitro for caths   Quinolones  Other (See Comments)   Aortic root enlargement        Medication List     STOP taking these medications    amLODipine 10 MG tablet Commonly known as: NORVASC   esomeprazole 20 MG capsule Commonly known as: NEXIUM   Farxiga 10 MG Tabs tablet Generic drug: dapagliflozin propanediol   olmesartan-hydrochlorothiazide 40-25 MG tablet Commonly known as: BENICAR HCT   rosuvastatin 10 MG tablet Commonly known as: CRESTOR       TAKE these medications    Accu-Chek Guide test strip Generic drug: glucose blood USE TWICE DAILY   acetaminophen 650 MG CR tablet Commonly known as: TYLENOL Take 650 mg by mouth every 8 (eight) hours as needed for pain.   allopurinol 300 MG tablet Commonly known as: ZYLOPRIM Take 300 mg by mouth every other day. Take in the evening - on even days of the month   Aspercreme Lidocaine 4 % Generic drug: lidocaine Place 1 patch onto the skin daily as needed (pain).   aspirin 81 MG chewable tablet Chew 1 tablet (81 mg total) by mouth daily. Take for 30 days and then stop.   atorvastatin 40 MG tablet Commonly known as: LIPITOR Take 1 tablet (40 mg total) by mouth daily.   clonazePAM 1 MG tablet Commonly known as: KLONOPIN Take 0.5 mg by mouth at bedtime.   clopidogrel 75 MG tablet Commonly known as: PLAVIX TAKE 1 TABLET BY MOUTH  DAILY   Coenzyme Q10 300 MG Caps Take 300 mg by mouth daily.   Eliquis 5 MG Tabs tablet Generic drug: apixaban TAKE 1 TABLET BY MOUTH TWICE A DAY What changed: how much to take   ezetimibe 10 MG tablet Commonly known as: ZETIA TAKE 1 TABLET BY MOUTH  DAILY   fenofibrate micronized 200 MG capsule Commonly known as: LOFIBRA Take 1 capsule (200 mg total) by mouth daily before breakfast.   fluorouracil 5 % cream Commonly known as: EFUDEX Apply 1 Application topically daily as needed (cancer spots). Use as directed   fluticasone 50 MCG/ACT nasal spray  Commonly known as: FLONASE Place 2 sprays into both  nostrils daily as needed for allergies.   glipiZIDE 5 MG tablet Commonly known as: GLUCOTROL Take 0.5 tablets (2.5 mg total) by mouth daily before breakfast AND 1 tablet (5 mg total) daily before supper.   levothyroxine 175 MCG tablet Commonly known as: SYNTHROID Take 175 mcg by mouth daily before breakfast.   loratadine 10 MG tablet Commonly known as: CLARITIN Take 10 mg by mouth at bedtime.   metoprolol tartrate 25 MG tablet Commonly known as: LOPRESSOR TAKE 1 TABLET BY MOUTH  TWICE DAILY   nitroGLYCERIN 0.4 MG SL tablet Commonly known as: Nitrostat Place 1 tablet (0.4 mg total) under the tongue every 5 (five) minutes as needed for chest pain.   pantoprazole 40 MG tablet Commonly known as: PROTONIX TAKE 1 TABLET BY MOUTH  DAILY   PreserVision AREDS 2 Caps Take 1 capsule by mouth 2 (two) times daily.   SYSTANE OP Place 1 drop into both eyes daily as needed (dry/irritated eyes).   tamsulosin 0.4 MG Caps capsule Commonly known as: FLOMAX Take 0.4 mg by mouth every other day. Take in the evening - on even days of the month   triamcinolone cream 0.1 % Commonly known as: KENALOG Apply 1 application  topically daily as needed (rash).   TURMERIC PO Take 2 tablets by mouth in the morning and at bedtime.   Vascepa 1 g capsule Generic drug: icosapent Ethyl TAKE 2 CAPSULES BY MOUTH 2 TIMES DAILY.   VITAMIN B-12 PO Take 2,500 mcg by mouth daily.   Vitamin D3 50 MCG (2000 UT) Tabs Take 2,000 Units by mouth daily.         Outstanding Labs/Studies   BMET at follow up   Duration of Discharge Encounter   Greater than 30 minutes including physician time.  Signed, Reino Bellis, NP 06/18/2022, 12:59 PM

## 2022-06-19 MED FILL — Nitroglycerin IV Soln 100 MCG/ML in D5W: INTRA_ARTERIAL | Qty: 10 | Status: AC

## 2022-06-29 NOTE — Progress Notes (Signed)
Office Visit    Patient Name: Garrett CORMIER Sr. Date of Encounter: 06/30/2022  PCP:  Antony Contras, MD   [place]  Cardiologist:  Candee Furbish, MD  Advanced Practice Provider:  No care team member to display Electrophysiologist:  None   HPI    Garrett DRAB Sr. is a 81 y.o. male with past medical history significant for CABG x5 in 2010 (LIMA to LAD, SVG to OM 2, SVG to RV marginal branch of the RCA, SVG to PDA, and SVG to distal posterior lateral branch of the RCA), chronic anticoagulation with apixaban and clopidogrel with history of EVAR and CAD, abdominal aortic aneurysm, atrial fibrillation, prostate cancer, CKD stage III, COVID (06-2019 and in 2022), diabetes mellitus, hypertension, hyperlipidemia, hypothyroidism presents today for hospital follow-up status post PCI.  The patient was admitted 06/15/2022 for chest pressure.  He states that he woke up earlier that morning with substernal chest pressure and came by private vehicle to the emergency department where his EKG showed chronic RBBB but new ST segment elevation.  Code STEMI was called.  At the time of evaluation he was continuing to experience some mild substernal and left-sided discomfort.  He states the pain felt more like pressure across the center of his chest as well as a pain on the left side that felt more like "heartburn".  He took some antacid medication but this did not help.  He did not have any associated dyspnea, diaphoresis, nausea, or vomiting.  He had not experienced pain like this before.  Stated with his old MI he had discomfort in his jaw which she was not experiencing.  He ultimately underwent cardiac catheterization which showed acute occlusion of the SVG to left posterior lateral branch with unsuccessful PCI due to inability to restore TIMI-3 flow.  Loaded with Plavix 600 mg prior to procedure and recommend to continue for at least 3 months.  Plan to continue triple therapy with ASA,  Plavix, Eliquis x1 month.  Then stop aspirin.  Today, he shares that he was taken off of his blood pressure medicine due to his kidney function.  They actually held him in the hospital until his kidney function decreased.  He remains on triple anticoagulation and antiplatelet therapy.  He states that he gets winded frequently.  He cannot increase his activity level because of his back.  He states he was cleared from a cardiac standpoint to have back surgery when he had his MI.  They were unable to place a stent and he has been treated with medical therapy.  His blood pressure is elevated today at 160/92.  He was recently switched to atorvastatin so we will plan to get lipids and LFTs in 6 weeks.  We have ordered a BMP today to check his kidney function.  He also sees a nephrologist.  No chest pain or pressure. No edema, orthopnea, PND. Reports no palpitations.   Past Medical History    Past Medical History:  Diagnosis Date   AAA (abdominal aortic aneurysm) (HCC)    Arthritis    knees   Atrial fibrillation (HCC)    CAD (coronary artery disease)    Cancer ([place])    prostate   CKD (chronic kidney disease), stage III ([place]) 06/18/2022   COVID    06/2019 and in 2022 states they were mild cases   Diabetes mellitus without complication ([place])    Dysrhythmia    A-fib pt is on Eliquis   GERD (gastroesophageal  reflux disease)    Gout    History of kidney stones    Hyperlipidemia    Hypertension    Hypothyroidism    Myocardial infarction ([place]) 01/1991   sp inferior   Nerve compression    right leg   Reflux    Seasonal allergies    Thoracic ascending aortic aneurysm [facility])    Thyroid disease    hypothyroidism   Past Surgical History:  Procedure Laterality Date   ABDOMINAL AORTAGRAM N/A 02/28/2012   Procedure: ABDOMINAL Maxcine Ham;  Surgeon: Serafina Mitchell, MD;  Location: [facility] CATH LAB;  Service: Cardiovascular;  Laterality: N/A;   abdominal aortagram embolization  02/28/2012   ABDOMINAL AORTIC  ANEURYSM REPAIR  Sept. 2013   CORONARY ARTERY BYPASS GRAFT  04/2009   CORONARY BALLOON ANGIOPLASTY N/A 06/15/2022   Procedure: CORONARY BALLOON ANGIOPLASTY;  Surgeon: Sherren Mocha, MD;  Location: [place] CV LAB;  Service: Cardiovascular;  Laterality: N/A;   CORONARY STENT PLACEMENT  1992 and  2009   RCA   EMBOLIZATION Right 02/28/2012   Procedure: EMBOLIZATION;  Surgeon: Serafina Mitchell, MD;  Location[facility] CATH LAB;  Service: Cardiovascular;  Laterality: Right;   IR GENERIC HISTORICAL  12/05/2016   IR RADIOLOGIST EVAL & MGMT 12/05/2016 Jacqulynn Cadet, MD [facility] INTERV RAD   IR RADIOLOGIST EVAL & MGMT  07/10/2019   IR RADIOLOGIST EVAL & MGMT  08/25/2020   IR RADIOLOGIST EVAL & MGMT  08/16/2021   KIDNEY STONE SURGERY     LEFT HEART CATH AND CORONARY ANGIOGRAPHY N/A 06/15/2022   Procedure: LEFT HEART CATH AND CORONARY ANGIOGRAPHY;  Surgeon: Sherren Mocha, MD;  Location: [place] CV LAB;  Service: Cardiovascular;  Laterality: N/A;   LEFT HEART CATHETERIZATION WITH CORONARY/GRAFT ANGIOGRAM N/A 01/08/2015   Procedure: LEFT HEART CATHETERIZATION WITH Beatrix Fetters;  Surgeon: Jacolyn Reedy, MD;  Location: [facility] C[facility] LAB;  Service: Cardiovascular;  Laterality: N/A;   parathyroid adenoma     PROSTATE SURGERY     rad seeds   SPINE SURGERY     TONSILLECTOMY      Allergies  Allergies  Allergen Reactions   Ace Inhibitors Cough   Crestor [Rosuvastatin] Other (See Comments)    Muscle aches   Nitroglycerin Other (See Comments)    Very pronounced lowered BP with IV nitro for caths   Quinolones Other (See Comments)    Aortic root enlargement    EKGs/Labs/Other Studies Reviewed:   The following studies were reviewed today:  Cath: 06/15/22   1.  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  Status post CABG with continued patency of the  LIMA to LAD, saphenous vein graft to acute marginal, and saphenous vein graft to PDA with extensive diffuse degenerative changes. 3.  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  Acute occlusion of the saphenous vein graft to left posterolateral branch with extensive degenerative changes and unsuccessful PCI due to inability to restore TIMI-3 flow in an extensively degenerated graft 5.  Normal LVEDP   Plan: Patient loaded with clopidogrel 600 mg prior to the procedure, would continue at least 3 months and he will need to delay his planned back surgery for this duration.  Resume apixaban tomorrow morning.  Check 2D echocardiogram.  Post MI CV-ICU care, anticipate hospital discharge in 48 hours if no complications arise.  Diagnostic Dominance: Right  Intervention     Echo: 06/15/22   IMPRESSIONS     1. Left ventricular ejection fraction, by estimation, is 55 to 60%. The  left ventricle has normal function. The left ventricle demonstrates global  hypokinesis. There is mild concentric left ventricular hypertrophy. Left  ventricular diastolic parameters  are indeterminate.   2. Right ventricular systolic function is mildly reduced. The right  ventricular size is normal.   3. Left atrial size was moderately dilated.   4. Right atrial size was moderately dilated.   5. The mitral valve is normal in structure. No evidence of mitral valve  regurgitation. No evidence of mitral stenosis.   6. Tricuspid valve regurgitation is moderate.   7. The aortic valve is normal in structure. Aortic valve regurgitation is  not visualized. No aortic stenosis is present.   8. Aneurysm of the ascending aorta, measuring 44 mm. There is mild  dilatation of the aortic root, measuring 39 mm.   9. The inferior vena cava is normal in size with greater than 50%  respiratory variability, suggesting right atrial pressure of 3 mmHg.   FINDINGS   Left Ventricle: Left ventricular ejection  fraction, by estimation, is 55  to 60%. The left ventricle has normal function. The left ventricle  demonstrates global hypokinesis. The left ventricular internal cavity size  was normal in size. There is mild  concentric left ventricular hypertrophy. Left ventricular diastolic  parameters are indeterminate.      LV Wall Scoring:  The basal inferolateral segment and basal inferior segment are  hypokinetic.  The entire anterior wall, antero-lateral wall, mid and distal lateral  wall,  entire septum, entire apex, and mid and distal inferior wall are normal.   Right Ventricle: The right ventricular size is normal. No increase in  right ventricular wall thickness. Right ventricular systolic function is  mildly reduced.   Left Atrium: Left atrial size was moderately dilated.   Right Atrium: Right atrial size was moderately dilated.   Pericardium: There is no evidence of pericardial effusion. Presence of  epicardial fat layer.   Mitral Valve: The mitral valve is normal in structure. No evidence of  mitral valve regurgitation. No evidence of mitral valve stenosis.   Tricuspid Valve: The tricuspid valve is normal in structure. Tricuspid  valve regurgitation is moderate . No evidence of tricuspid stenosis.   Aortic Valve: The aortic valve is normal in structure. Aortic valve  regurgitation is not visualized. No aortic stenosis is present.   Pulmonic Valve: The pulmonic valve was normal in structure. Pulmonic valve  regurgitation is trivial. No evidence of pulmonic stenosis.   Aorta: The aortic root is normal in size and structure. There is mild  dilatation of the aortic root, measuring 39 mm. There is an aneurysm  involving the ascending aorta measuring 44 mm.   Venous: The inferior vena cava is normal in size with greater than 50%  respiratory variability, suggesting right atrial pressure of 3 mmHg.   IAS/Shunts: No atrial level shunt detected by color flow Doppler.   _____________  EKG:  EKG is not ordered today.   Recent Labs: 06/15/2022: ALT 27 06/16/2022: Hemoglobin 14.6; Platelets 135 06/17/2022: Magnesium 1.9 06/18/2022: BUN 27; Creatinine, Ser 2.00; Potassium 4.5; Sodium 134  Recent Lipid Panel    Component Value Date/Time   CHOL 149 06/15/2022 0755   CHOL 137 07/15/2021 0912   TRIG 372 (H) 06/15/2022 0755   HDL 30 (L) 06/15/2022 0755   HDL 29 (  L) 07/15/2021 0912   CHOLHDL 5.0 06/15/2022 0755   VLDL 74 (H) 06/15/2022 0755   LDLCALC 45 06/15/2022 0755   LDLCALC 64 07/15/2021 0912   LDLDIRECT 67 07/15/2021 0912    Home Medications   Current Meds  Medication Sig   ACCU-CHEK GUIDE test strip USE TWICE DAILY   acetaminophen (TYLENOL) 650 MG CR tablet Take 650 mg by mouth every 8 (eight) hours as needed for pain.   allopurinol (ZYLOPRIM) 300 MG tablet Take 300 mg by mouth every other day. Take in the evening - on even days of the month   aspirin 81 MG chewable tablet Chew 1 tablet (81 mg total) by mouth daily. Take for 30 days and then stop.   atorvastatin (LIPITOR) 40 MG tablet Take 1 tablet (40 mg total) by mouth daily.   Cholecalciferol (VITAMIN D3) 2000 UNITS TABS Take 2,000 Units by mouth daily.    clonazePAM (KLONOPIN) 1 MG tablet Take 0.5 mg by mouth at bedtime.   clopidogrel (PLAVIX) 75 MG tablet TAKE 1 TABLET BY MOUTH  DAILY (Patient taking differently: Take 75 mg by mouth daily.)   Coenzyme Q10 300 MG CAPS Take 300 mg by mouth daily.   Cyanocobalamin (VITAMIN B-12 PO) Take 2,500 mcg by mouth daily.   ELIQUIS 5 MG TABS tablet TAKE 1 TABLET BY MOUTH TWICE A DAY (Patient taking differently: Take 5 mg by mouth 2 (two) times daily.)   ezetimibe (ZETIA) 10 MG tablet TAKE 1 TABLET BY MOUTH  DAILY   fenofibrate micronized (LOFIBRA) 200 MG capsule Take 1 capsule (200 mg total) by mouth daily before breakfast.   fluorouracil (EFUDEX) 5 % cream Apply 1 Application topically daily as needed (cancer spots). Use as directed   fluticasone (FLONASE)  50 MCG/ACT nasal spray Place 2 sprays into both nostrils daily as needed for allergies.   glipiZIDE (GLUCOTROL) 5 MG tablet Take 0.5 tablets (2.5 mg total) by mouth daily before breakfast AND 1 tablet (5 mg total) daily before supper.   levothyroxine (SYNTHROID, LEVOTHROID) 175 MCG tablet Take 175 mcg by mouth daily before breakfast.   lidocaine (ASPERCREME LIDOCAINE) 4 % Place 1 patch onto the skin daily as needed (pain).   loratadine (CLARITIN) 10 MG tablet Take 10 mg by mouth at bedtime.   metoprolol tartrate (LOPRESSOR) 25 MG tablet TAKE 1 TABLET BY MOUTH  TWICE DAILY (Patient taking differently: Take 25 mg by mouth 2 (two) times daily.)   Multiple Vitamins-Minerals (PRESERVISION AREDS 2) CAPS Take 1 capsule by mouth 2 (two) times daily.   nitroGLYCERIN (NITROSTAT) 0.4 MG SL tablet Place 1 tablet (0.4 mg total) under the tongue every 5 (five) minutes as needed for chest pain.   olmesartan-hydrochlorothiazide (BENICAR HCT) 20-12.5 MG tablet Take 1 tablet by mouth daily.   pantoprazole (PROTONIX) 40 MG tablet TAKE 1 TABLET BY MOUTH  DAILY   Polyethyl Glycol-Propyl Glycol (SYSTANE OP) Place 1 drop into both eyes daily as needed (dry/irritated eyes).   Tamsulosin HCl (FLOMAX) 0.4 MG CAPS Take 0.4 mg by mouth every other day. Take in the evening - on even days of the month   triamcinolone cream (KENALOG) 0.1 % Apply 1 application  topically daily as needed (rash).   TURMERIC PO Take 2 tablets by mouth in the morning and at bedtime.   VASCEPA 1 g capsule TAKE 2 CAPSULES BY MOUTH 2 TIMES DAILY.     Review of Systems      All other systems reviewed and are otherwise negative  except as noted above.  Physical Exam    VS:  BP (!) 160/92   Pulse 72   Ht '5\' 9"'$  (1.753 m)   Wt 205 lb 12.8 oz (93.4 kg)   SpO2 96%   BMI 30.39 kg/m  , BMI Body mass index is 30.39 kg/m.  Wt Readings from Last 3 Encounters:  06/30/22 205 lb 12.8 oz (93.4 kg)  06/16/22 203 lb 4.2 oz (92.2 kg)  06/13/22 203 lb 1.6  oz (92.1 kg)     GEN: Well nourished, well developed, in no acute distress. HEENT: normal. Neck: Supple, no JVD, carotid bruits, or masses. Cardiac: RRR, no murmurs, rubs, or gallops. No clubbing, cyanosis, edema.  Radials/PT 2+ and equal bilaterally.  Respiratory:  Respirations regular and unlabored, clear to auscultation bilaterally. GI: Soft, nontender, nondistended. MS: No deformity or atrophy. Skin: Warm and dry, no rash. Neuro:  Strength and sensation are intact. Psych: Normal affect.  Assessment & Plan    STEMI/CAD status post CABG -Acute occlusion of SVG to left posterolateral branch with unsuccessful PCI -To remain on triple therapy with ASA, Plavix, Eliquis x1 month.  Then stop aspirin.  Recommendations for treatment for 3 months prior to considering back surgery-official clearance will need to be sent to our office  -Cleared for cardiac rehab, they have already called him and they are on a wait  Hypertension -High today in the office 160/92, he brought in a blood pressure log and it has been in the 562B to 638L systolic. -We will start him back on his Benicar but at a lower dose 20/12.5 mg -BMP today, he also follows with a nephrologist  Hyperlipidemia -Recently started on Lipitor 40 mg daily -We will plan to order lipid panel and LFTs in 6 weeks or before his next appointment  CKD stage IIIb -BMP today -Follow-up with nephrology  Diabetes mellitus -A1c 6.7 -Continue current medication regimen -Per PCP  Paroxysmal atrial fibrillation -Resumed on Eliquis 5 mg twice a day  AAA status post EVAR -Continue to take Plavix  8. Aortic root aneurysm  -44 mm on recent echocardiogram (aneurysm of ascending aorta) with mild dilatation of the aortic root measuring 39 mm -Important to keep blood pressure well controlled -Continue to monitor  9. Spinal stenosis  -can't get out in the yard like he used to -walking is okay and he can do stairs slowly -He needs surgery  but we need an official clearance from the surgeon   Disposition: Follow up 2-3 months with Candee Furbish, MD or APP.  Signed, Elgie Collard, PA-C 06/30/2022, 2:40 PM [facility]

## 2022-06-30 ENCOUNTER — Ambulatory Visit: Payer: Medicare Other | Admitting: Physician Assistant

## 2022-06-30 ENCOUNTER — Encounter: Payer: Self-pay | Admitting: Physician Assistant

## 2022-06-30 VITALS — BP 160/92 | HR 72 | Ht 69.0 in | Wt 205.8 lb

## 2022-06-30 DIAGNOSIS — I152 Hypertension secondary to endocrine disorders: Secondary | ICD-10-CM

## 2022-06-30 DIAGNOSIS — I714 Abdominal aortic aneurysm, without rupture, unspecified: Secondary | ICD-10-CM | POA: Diagnosis not present

## 2022-06-30 DIAGNOSIS — E118 Type 2 diabetes mellitus with unspecified complications: Secondary | ICD-10-CM | POA: Diagnosis not present

## 2022-06-30 DIAGNOSIS — N183 Chronic kidney disease, stage 3 unspecified: Secondary | ICD-10-CM

## 2022-06-30 DIAGNOSIS — I480 Paroxysmal atrial fibrillation: Secondary | ICD-10-CM | POA: Diagnosis not present

## 2022-06-30 DIAGNOSIS — E785 Hyperlipidemia, unspecified: Secondary | ICD-10-CM | POA: Diagnosis not present

## 2022-06-30 DIAGNOSIS — E1159 Type 2 diabetes mellitus with other circulatory complications: Secondary | ICD-10-CM | POA: Diagnosis not present

## 2022-06-30 DIAGNOSIS — Z951 Presence of aortocoronary bypass graft: Secondary | ICD-10-CM

## 2022-06-30 MED ORDER — OLMESARTAN MEDOXOMIL-HCTZ 20-12.5 MG PO TABS: 1.0000 | ORAL_TABLET | Freq: Every day | ORAL | 3 refills | Status: DC

## 2022-06-30 NOTE — Patient Instructions (Addendum)
Medication Instructions:  1.Start olmesartan-hydrochlorthiazide (Benicar-HCT) 20-12.5 mg daily *If you need a refill on your cardiac medications before your next appointment, please call your pharmacy*   Lab Work: BMP today Lipids and lft's in 6 weeks on 08/11/22 anytime between 7:15 AM and 4:30 PM If you have labs (blood work) drawn today and your tests are completely normal, you will receive your results only by: [place] (if you have MyChart) OR A paper copy in the mail If you have any lab test that is abnormal or we need to change your treatment, we will call you to review the results.   Follow-Up: At [facility], you and your health needs are our priority.  As part of our continuing mission to provide you with exceptional heart care, we have created designated Provider Care Teams.  These Care Teams include your primary Cardiologist (physician) and Advanced Practice Providers (APPs -  Physician Assistants and Nurse Practitioners) who all work together to provide you with the care you need, when you need it.  Your next appointment:   08/15/2022 at 8:40 AM  The format for your next appointment:   In Person  Provider:   Candee Furbish, MD {  Other Instructions Keep a blood pressure log for two weeks and call us or send [name] a MyChart with your readings  Important Information About Sugar

## 2022-07-01 LAB — BASIC METABOLIC PANEL
BUN/Creatinine Ratio: 15 (ref 10–24)
BUN: 21 mg/dL (ref 8–27)
CO2: 17 mmol/L — ABNORMAL LOW (ref 20–29)
Calcium: 10.2 mg/dL (ref 8.6–10.2)
Chloride: 104 mmol/L (ref 96–106)
Creatinine, Ser: 1.41 mg/dL — ABNORMAL HIGH (ref 0.76–1.27)
Glucose: 85 mg/dL (ref 70–99)
Potassium: 4.1 mmol/L (ref 3.5–5.2)
Sodium: 140 mmol/L (ref 134–144)
eGFR: 51 mL/min/{1.73_m2} — ABNORMAL LOW (ref >59)

## 2022-07-04 DIAGNOSIS — D0439 Carcinoma in situ of skin of other parts of face: Secondary | ICD-10-CM | POA: Diagnosis not present

## 2022-07-04 DIAGNOSIS — L2089 Other atopic dermatitis: Secondary | ICD-10-CM | POA: Diagnosis not present

## 2022-07-20 ENCOUNTER — Other Ambulatory Visit: Payer: Self-pay | Admitting: *Deleted

## 2022-07-20 DIAGNOSIS — I2510 Atherosclerotic heart disease of native coronary artery without angina pectoris: Secondary | ICD-10-CM | POA: Diagnosis not present

## 2022-07-20 DIAGNOSIS — I4891 Unspecified atrial fibrillation: Secondary | ICD-10-CM | POA: Diagnosis not present

## 2022-07-20 DIAGNOSIS — E039 Hypothyroidism, unspecified: Secondary | ICD-10-CM | POA: Diagnosis not present

## 2022-07-20 DIAGNOSIS — G2581 Restless legs syndrome: Secondary | ICD-10-CM | POA: Diagnosis not present

## 2022-07-20 DIAGNOSIS — E1122 Type 2 diabetes mellitus with diabetic chronic kidney disease: Secondary | ICD-10-CM | POA: Diagnosis not present

## 2022-07-20 DIAGNOSIS — M48061 Spinal stenosis, lumbar region without neurogenic claudication: Secondary | ICD-10-CM | POA: Diagnosis not present

## 2022-07-20 DIAGNOSIS — Z7984 Long term (current) use of oral hypoglycemic drugs: Secondary | ICD-10-CM | POA: Diagnosis not present

## 2022-07-20 DIAGNOSIS — I7121 Aneurysm of the ascending aorta, without rupture: Secondary | ICD-10-CM

## 2022-07-20 DIAGNOSIS — I700 Atherosclerosis of aorta: Secondary | ICD-10-CM | POA: Diagnosis not present

## 2022-07-20 DIAGNOSIS — N184 Chronic kidney disease, stage 4 (severe): Secondary | ICD-10-CM | POA: Diagnosis not present

## 2022-07-20 DIAGNOSIS — D6869 Other thrombophilia: Secondary | ICD-10-CM | POA: Diagnosis not present

## 2022-07-20 DIAGNOSIS — I10 Essential (primary) hypertension: Secondary | ICD-10-CM | POA: Diagnosis not present

## 2022-07-26 ENCOUNTER — Telehealth (HOSPITAL_COMMUNITY): Payer: Self-pay

## 2022-07-26 NOTE — Telephone Encounter (Signed)
Called patient to see if he was interested in participating in the Cardiac Rehab Program. Patient stated yes. Patient will come in for orientation on 07/27/2022'@9'$ :30am and will attend the 8:00am exercise class.

## 2022-07-27 ENCOUNTER — Other Ambulatory Visit: Payer: Self-pay | Admitting: Interventional Radiology

## 2022-07-27 ENCOUNTER — Encounter (HOSPITAL_COMMUNITY): Payer: Self-pay

## 2022-07-27 ENCOUNTER — Encounter (HOSPITAL_COMMUNITY)
Admission: RE | Admit: 2022-07-27 | Discharge: 2022-07-27 | Disposition: A | Discharge status: Home / Self Care | Payer: Medicare Other | Source: Ambulatory Visit | Attending: Cardiology | Admitting: Cardiology

## 2022-07-27 VITALS — BP 118/72 | HR 71 | Ht 69.5 in | Wt 203.0 lb

## 2022-07-27 DIAGNOSIS — I252 Old myocardial infarction: Secondary | ICD-10-CM | POA: Diagnosis not present

## 2022-07-27 DIAGNOSIS — Z4889 Encounter for other specified surgical aftercare: Secondary | ICD-10-CM | POA: Insufficient documentation

## 2022-07-27 DIAGNOSIS — I714 Abdominal aortic aneurysm, without rupture, unspecified: Secondary | ICD-10-CM

## 2022-07-27 DIAGNOSIS — I9789 Other postprocedural complications and disorders of the circulatory system, not elsewhere classified: Secondary | ICD-10-CM

## 2022-07-27 DIAGNOSIS — I2129 ST elevation (STEMI) myocardial infarction involving other sites: Secondary | ICD-10-CM

## 2022-07-27 NOTE — Progress Notes (Signed)
Cardiac Individual Treatment Plan  Patient Details  Name: Garrett GERARDO Sr. MRN: [id number] Date of Birth: June 08, 1941 Referring Provider:   Flowsheet Row INTENSIVE CARDIAC REHAB ORIENT from 07/27/2022 in [place]  Referring Provider Dr Candee Furbish, MD       Initial Encounter Date:  [facility] from 07/27/2022 in [place]  Date 07/27/22       Visit Diagnosis: 06/15/22 STEMI, unsuccessful PCI, SVG  Patient's Home Medications on Admission:  Current Outpatient Medications:    Ferrous Sulfate Dried (HIGH POTENCY IRON) 65 MG TABS, Take 65 mg by mouth daily., Disp: , Rfl:    magnesium oxide (MAG-OX) 400 (240 Mg) MG tablet, Take 400 mg by mouth daily., Disp: , Rfl:    ACCU-CHEK GUIDE test strip, USE TWICE DAILY, Disp: 200 strip, Rfl: 3   acetaminophen (TYLENOL) 650 MG CR tablet, Take 650 mg by mouth every 8 (eight) hours as needed for pain., Disp: , Rfl:    allopurinol (ZYLOPRIM) 300 MG tablet, Take 300 mg by mouth every other day. Take in the evening - on even days of the month, Disp: , Rfl:    aspirin 81 MG chewable tablet, Chew 1 tablet (81 mg total) by mouth daily. Take for 30 days and then stop. (Patient not taking: Reported on 07/27/2022), Disp: , Rfl:    atorvastatin (LIPITOR) 40 MG tablet, Take 1 tablet (40 mg total) by mouth daily., Disp: 30 tablet, Rfl: 11   Cholecalciferol (VITAMIN D3) 2000 UNITS TABS, Take 2,000 Units by mouth daily. , Disp: , Rfl:    clonazePAM (KLONOPIN) 1 MG tablet, Take 0.5 mg by mouth at bedtime., Disp: , Rfl:    clopidogrel (PLAVIX) 75 MG tablet, TAKE 1 TABLET BY MOUTH  DAILY (Patient taking differently: Take 75 mg by mouth daily.), Disp: 90 tablet, Rfl: 3   Coenzyme Q10 300 MG CAPS, Take 300 mg by mouth daily., Disp: , Rfl:    Cyanocobalamin (VITAMIN B-12 PO), Take 2,500 mcg by mouth daily., Disp: , Rfl:    ELIQUIS 5 MG TABS tablet, TAKE 1 TABLET BY MOUTH  TWICE A DAY (Patient taking differently: Take 5 mg by mouth 2 (two) times daily.), Disp: 180 tablet, Rfl: 1   ezetimibe (ZETIA) 10 MG tablet, TAKE 1 TABLET BY MOUTH  DAILY, Disp: 90 tablet, Rfl: 3   fenofibrate micronized (LOFIBRA) 200 MG capsule, Take 1 capsule (200 mg total) by mouth daily before breakfast., Disp: 90 capsule, Rfl: 3   fluorouracil (EFUDEX) 5 % cream, Apply 1 Application topically daily as needed (cancer spots). Use as directed, Disp: , Rfl:    fluticasone (FLONASE) 50 MCG/ACT nasal spray, Place 2 sprays into both nostrils daily as needed for allergies., Disp: , Rfl:    glipiZIDE (GLUCOTROL) 5 MG tablet, Take 0.5 tablets (2.5 mg total) by mouth daily before breakfast AND 1 tablet (5 mg total) daily before supper., Disp: 135 tablet, Rfl: 3   levothyroxine (SYNTHROID, LEVOTHROID) 175 MCG tablet, Take 175 mcg by mouth daily before breakfast., Disp: , Rfl:    lidocaine (ASPERCREME LIDOCAINE) 4 %, Place 1 patch onto the skin daily as needed (pain)., Disp: , Rfl:    loratadine (CLARITIN) 10 MG tablet, Take 10 mg by mouth at bedtime., Disp: , Rfl:    metoprolol tartrate (LOPRESSOR) 25 MG tablet, TAKE 1 TABLET BY MOUTH  TWICE DAILY (Patient taking differently: Take 25 mg by mouth 2 (two) times daily.),  Disp: 180 tablet, Rfl: 3   Multiple Vitamins-Minerals (PRESERVISION AREDS 2) CAPS, Take 1 capsule by mouth 2 (two) times daily., Disp: , Rfl:    nitroGLYCERIN (NITROSTAT) 0.4 MG SL tablet, Place 1 tablet (0.4 mg total) under the tongue every 5 (five) minutes as needed for chest pain., Disp: 25 tablet, Rfl: 3   olmesartan-hydrochlorothiazide (BENICAR HCT) 20-12.5 MG tablet, Take 1 tablet by mouth daily., Disp: 90 tablet, Rfl: 3   pantoprazole (PROTONIX) 40 MG tablet, TAKE 1 TABLET BY MOUTH  DAILY, Disp: 90 tablet, Rfl: 2   Polyethyl Glycol-Propyl Glycol (SYSTANE OP), Place 1 drop into both eyes daily as needed (dry/irritated eyes)., Disp: , Rfl:    Tamsulosin HCl (FLOMAX) 0.4 MG CAPS, Take 0.4  mg by mouth every other day. Take in the evening - on even days of the month, Disp: , Rfl:    traMADol (ULTRAM) 50 MG tablet, Take 50 mg by mouth as needed., Disp: , Rfl:    triamcinolone cream (KENALOG) 0.1 %, Apply 1 application  topically daily as needed (rash)., Disp: , Rfl: 2   TURMERIC PO, Take 2 tablets by mouth in the morning and at bedtime., Disp: , Rfl:    VASCEPA 1 g capsule, TAKE 2 CAPSULES BY MOUTH 2 TIMES DAILY., Disp: 360 capsule, Rfl: 3  Past Medical History: Past Medical History:  Diagnosis Date   AAA (abdominal aortic aneurysm) ([place])    Arthritis    knees   Atrial fibrillation (HCC)    CAD (coronary artery disease)    Cancer (HCC)    prostate   CKD (chronic kidney disease), stage III ([place]) 06/18/2022   COVID    06/2019 and in 2022 states they were mild cases   Diabetes mellitus without complication (HCC)    Dysrhythmia    A-fib pt is on Eliquis   GERD (gastroesophageal reflux disease)    Gout    History of kidney stones    Hyperlipidemia    Hypertension    Hypothyroidism    Myocardial infarction ([place]) 01/1991   sp inferior   Nerve compression    right leg   Reflux    Seasonal allergies    Thoracic ascending aortic aneurysm (HCC)    Thyroid disease    hypothyroidism    Tobacco Use: Social History   Tobacco Use  Smoking Status Former   Types: Cigarettes   Quit date: 08/01/1970   Years since quitting: 52.0  Smokeless Tobacco Never    Labs: Review Flowsheet  More data exists      Latest Ref Rng & Units 07/15/2021 09/29/2021 03/30/2022 06/13/2022 06/15/2022  Labs for ITP Cardiac and Pulmonary Rehab  Cholestrol 0 - 200 mg/dL 137  - - - 149   LDL (calc) 0 - 99 mg/dL 64  - - - 45   Direct LDL 0 - 99 mg/dL 67  - - - -  HDL-C >40 mg/dL 29  - - - 30   Trlycerides <150 mg/dL 278  - - - 372   Hemoglobin A1c 4.8 - 5.6 % - 6.9  7.0  6.7  6.7     Capillary Blood Glucose: Lab Results  Component Value Date   GLUCAP 140 (H) 06/18/2022   GLUCAP 173 (H)  06/18/2022   GLUCAP 128 (H) 06/17/2022   GLUCAP 134 (H) 06/17/2022   GLUCAP 139 (H) 06/17/2022     Exercise Target Goals: Exercise Program Goal: Individual exercise prescription set using results from initial 6 min walk test and  THRR while considering  patient's activity barriers and safety.   Exercise Prescription Goal: Initial exercise prescription builds to 30-45 minutes a day of aerobic activity, 2-3 days per week.  Home exercise guidelines will be given to patient during program as part of exercise prescription that the participant will acknowledge.  Activity Barriers & Risk Stratification:  Activity Barriers & Cardiac Risk Stratification - 07/27/22 1504       Activity Barriers & Cardiac Risk Stratification   Activity Barriers Back Problems;Neck/Spine Problems;Joint Problems;Deconditioning;Balance Concerns    Cardiac Risk Stratification High   2.54 mets on 6MWT            6 Minute Walk:  6 Minute Walk     Row Name 07/27/22 1307         6 Minute Walk   Phase Initial     Distance 1448 feet     Walk Time 6 minutes     # of Rest Breaks 0     MPH 2.74     METS 2.54     RPE 11     Perceived Dyspnea  1     VO2 Peak 8.9     Symptoms Yes (comment)     Comments bilateral hip pain, 3/10. resolved with rest     Resting HR 71 bpm     Resting BP 118/72     Resting Oxygen Saturation  97 %     Exercise Oxygen Saturation  during 6 min walk 97 %     Max Ex. HR 95 bpm     Max Ex. BP 140/82     2 Minute Post BP 126/70              Oxygen Initial Assessment:   Oxygen Re-Evaluation:   Oxygen Discharge (Final Oxygen Re-Evaluation):   Initial Exercise Prescription:  Initial Exercise Prescription - 07/27/22 1500       Date of Initial Exercise RX and Referring Provider   Date 07/27/22    Referring Provider Dr Candee Furbish, MD    Expected Discharge Date 09/22/22      Recumbant Bike   Level 2    RPM 60    Minutes 15    METs 2.2      NuStep   Level 2     SPM 75    Minutes 15    METs 2.2      Prescription Details   Frequency (times per week) 3    Duration Progress to 30 minutes of continuous aerobic without signs/symptoms of physical distress      Intensity   THRR 40-80% of Max Heartrate 56-113    Ratings of Perceived Exertion 11-13    Perceived Dyspnea 0-4      Progression   Progression Continue progressive overload as per policy without signs/symptoms or physical distress.      Resistance Training   Training Prescription Yes    Weight 3    Reps 10-15             Perform Capillary Blood Glucose checks as needed.  Exercise Prescription Changes:   Exercise Comments:   Exercise Goals and Review:   Exercise Goals     Row Name 07/27/22 1507             Exercise Goals   Increase Physical Activity Yes       Intervention Provide advice, education, support and counseling about physical activity/exercise needs.;Develop an individualized exercise prescription for aerobic and resistive training based  on initial evaluation findings, risk stratification, comorbidities and participant's personal goals.       Expected Outcomes Short Term: Attend rehab on a regular basis to increase amount of physical activity.;Long Term: Add in home exercise to make exercise part of routine and to increase amount of physical activity.;Long Term: Exercising regularly at least 3-5 days a week.       Increase Strength and Stamina Yes       Intervention Provide advice, education, support and counseling about physical activity/exercise needs.;Develop an individualized exercise prescription for aerobic and resistive training based on initial evaluation findings, risk stratification, comorbidities and participant's personal goals.       Expected Outcomes Short Term: Increase workloads from initial exercise prescription for resistance, speed, and METs.;Short Term: Perform resistance training exercises routinely during rehab and add in resistance training  at home;Long Term: Improve cardiorespiratory fitness, muscular endurance and strength as measured by increased METs and functional capacity (6MWT)       Able to understand and use rate of perceived exertion (RPE) scale Yes       Intervention Provide education and explanation on how to use RPE scale       Expected Outcomes Short Term: Able to use RPE daily in rehab to express subjective intensity level;Long Term:  Able to use RPE to guide intensity level when exercising independently       Knowledge and understanding of Target Heart Rate Range (THRR) Yes       Intervention Provide education and explanation of THRR including how the numbers were predicted and where they are located for reference       Expected Outcomes Short Term: Able to state/look up THRR;Short Term: Able to use daily as guideline for intensity in rehab;Long Term: Able to use THRR to govern intensity when exercising independently       Understanding of Exercise Prescription Yes       Intervention Provide education, explanation, and written materials on patient's individual exercise prescription       Expected Outcomes Short Term: Able to explain program exercise prescription;Long Term: Able to explain home exercise prescription to exercise independently                Exercise Goals Re-Evaluation :   Discharge Exercise Prescription (Final Exercise Prescription Changes):   Nutrition:  Target Goals: Understanding of nutrition guidelines, daily intake of sodium '1500mg'$ , cholesterol '200mg'$ , calories 30% from fat and 7% or less from saturated fats, daily to have 5 or more servings of fruits and vegetables.  Biometrics:  Pre Biometrics - 07/27/22 1302       Pre Biometrics   Waist Circumference 43 inches    Hip Circumference 41.5 inches    Waist to Hip Ratio 1.04 %    Triceps Skinfold 9 mm    % Body Fat 27.8 %    Grip Strength 42 kg    Flexibility 13.5 in    Single Leg Stand 2 seconds              Nutrition  Therapy Plan and Nutrition Goals:   Nutrition Assessments:  MEDIFICTS Score Key: ?70 Need to make dietary changes  40-70 Heart Healthy Diet ? 40 Therapeutic Level Cholesterol Diet    Picture Your Plate Scores: <24 Unhealthy dietary pattern with much room for improvement. 41-50 Dietary pattern unlikely to meet recommendations for good health and room for improvement. 51-60 More healthful dietary pattern, with some room for improvement.  >60 Healthy dietary pattern, although there  may be some specific behaviors that could be improved.    Nutrition Goals Re-Evaluation:   Nutrition Goals Re-Evaluation:   Nutrition Goals Discharge (Final Nutrition Goals Re-Evaluation):   Psychosocial: Target Goals: Acknowledge presence or absence of significant depression and/or stress, maximize coping skills, provide positive support system. Participant is able to verbalize types and ability to use techniques and skills needed for reducing stress and depression.  Initial Review & Psychosocial Screening:  Initial Psych Review & Screening - 07/27/22 1212       Initial Review   Current issues with None Identified      Family Dynamics   Good Support System? Yes   Tom lives alone he has his Son for support. Tom's wife passed away 3 years ago     Barriers   Psychosocial barriers to participate in program There are no identifiable barriers or psychosocial needs.      Screening Interventions   Interventions Encouraged to exercise             Quality of Life Scores:  Quality of Life - 07/27/22 1508       Quality of Life   Select Quality of Life      Quality of Life Scores   Health/Function Pre 22.21 %    Socioeconomic Pre 23.71 %    Psych/Spiritual Pre 25.57 %    Family Pre 28.8 %    GLOBAL Pre 24.16 %            Scores of 19 and below usually indicate a poorer quality of life in these areas.  A difference of  2-3 points is a clinically meaningful difference.  A difference of  2-3 points in the total score of the Quality of Life Index has been associated with significant improvement in overall quality of life, self-image, physical symptoms, and general health in studies assessing change in quality of life.  PHQ-9: Review Flowsheet       07/27/2022  Depression screen PHQ 2/9  Decreased Interest 0  Down, Depressed, Hopeless 0  PHQ - 2 Score 0   Interpretation of Total Score  Total Score Depression Severity:  1-4 = Minimal depression, 5-9 = Mild depression, 10-14 = Moderate depression, 15-19 = Moderately severe depression, 20-27 = Severe depression   Psychosocial Evaluation and Intervention:   Psychosocial Re-Evaluation:   Psychosocial Discharge (Final Psychosocial Re-Evaluation):   Vocational Rehabilitation: Provide vocational rehab assistance to qualifying candidates.   Vocational Rehab Evaluation & Intervention:  Vocational Rehab - 07/27/22 1213       Initial Vocational Rehab Evaluation & Intervention   Assessment shows need for Vocational Rehabilitation No   [name] is retired and does not need vocational rehab at this time            Education: Education Goals: Education classes will be provided on a weekly basis, covering required topics. Participant will state understanding/return demonstration of topics presented.     Core Videos: Exercise    Move It!  Clinical staff conducted group or individual video education with verbal and written material and guidebook.  Patient learns the recommended Pritikin exercise program. Exercise with the goal of living a long, healthy life. Some of the health benefits of exercise include controlled diabetes, healthier blood pressure levels, improved cholesterol levels, improved heart and lung capacity, improved sleep, and better body composition. Everyone should speak with their doctor before starting or changing an exercise routine.  Biomechanical Limitations Clinical staff conducted group or individual  video education with verbal  and written material and guidebook.  Patient learns how biomechanical limitations can impact exercise and how we can mitigate and possibly overcome limitations to have an impactful and balanced exercise routine.  Body Composition Clinical staff conducted group or individual video education with verbal and written material and guidebook.  Patient learns that body composition (ratio of muscle mass to fat mass) is a key component to assessing overall fitness, rather than body weight alone. Increased fat mass, especially visceral belly fat, can put [name] at increased risk for metabolic syndrome, type 2 diabetes, heart disease, and even death. It is recommended to combine diet and exercise (cardiovascular and resistance training) to improve your body composition. Seek guidance from your physician and exercise physiologist before implementing an exercise routine.  Exercise Action Plan Clinical staff conducted group or individual video education with verbal and written material and guidebook.  Patient learns the recommended strategies to achieve and enjoy long-term exercise adherence, including variety, self-motivation, self-efficacy, and positive decision making. Benefits of exercise include fitness, good health, weight management, more energy, better sleep, less stress, and overall well-being.  Medical   Heart Disease Risk Reduction Clinical staff conducted group or individual video education with verbal and written material and guidebook.  Patient learns our heart is our most vital organ as it circulates oxygen, nutrients, white blood cells, and hormones throughout the entire body, and carries waste away. Data supports a plant-based eating plan like the Pritikin Program for its effectiveness in slowing progression of and reversing heart disease. The video provides a number of recommendations to address heart disease.   Metabolic Syndrome and Belly Fat  Clinical staff conducted  group or individual video education with verbal and written material and guidebook.  Patient learns what metabolic syndrome is, how it leads to heart disease, and how one can reverse it and keep it from coming back. You have metabolic syndrome if you have 3 of the following 5 criteria: abdominal obesity, high blood pressure, high triglycerides, low HDL cholesterol, and high blood sugar.  Hypertension and Heart Disease Clinical staff conducted group or individual video education with verbal and written material and guidebook.  Patient learns that high blood pressure, or hypertension, is very common in the [place]. Hypertension is largely due to excessive salt intake, but other important risk factors include being overweight, physical inactivity, drinking too much alcohol, smoking, and not eating enough potassium from fruits and vegetables. High blood pressure is a leading risk factor for heart attack, stroke, congestive heart failure, dementia, kidney failure, and premature death. Long-term effects of excessive salt intake include stiffening of the arteries and thickening of heart muscle and organ damage. Recommendations include ways to reduce hypertension and the risk of heart disease.  Diseases of Our Time - Focusing on Diabetes Clinical staff conducted group or individual video education with verbal and written material and guidebook.  Patient learns why the best way to stop diseases of our time is prevention, through food and other lifestyle changes. Medicine (such as prescription pills and surgeries) is often only a Band-Aid on the problem, not a long-term solution. Most common diseases of our time include obesity, type 2 diabetes, hypertension, heart disease, and cancer. The Pritikin Program is recommended and has been proven to help reduce, reverse, and/or prevent the damaging effects of metabolic syndrome.  Nutrition   Overview of the Pritikin Eating Plan  Clinical staff conducted group or  individual video education with verbal and written material and guidebook.  Patient learns about the Pritikin  Eating Plan for disease risk reduction. The [place] emphasizes a wide variety of unrefined, minimally-processed carbohydrates, like fruits, vegetables, whole grains, and legumes. Go, Caution, and Stop food choices are explained. Plant-based and lean animal proteins are emphasized. Rationale provided for low sodium intake for blood pressure control, low added sugars for blood sugar stabilization, and low added fats and oils for coronary artery disease risk reduction and weight management.  Calorie Density  Clinical staff conducted group or individual video education with verbal and written material and guidebook.  Patient learns about calorie density and how it impacts the Pritikin Eating Plan. Knowing the characteristics of the food you choose will help you decide whether those foods will lead to weight gain or weight loss, and whether you want to consume more or less of them. Weight loss is usually a side effect of the Pritikin Eating Plan because of its focus on low calorie-dense foods.  Label Reading  Clinical staff conducted group or individual video education with verbal and written material and guidebook.  Patient learns about the Pritikin recommended label reading guidelines and corresponding recommendations regarding calorie density, added sugars, sodium content, and whole grains.  Dining Out - Part 1  Clinical staff conducted group or individual video education with verbal and written material and guidebook.  Patient learns that restaurant meals can be sabotaging because they can be so high in calories, fat, sodium, and/or sugar. Patient learns recommended strategies on how to positively address this and avoid unhealthy pitfalls.  Facts on Fats  Clinical staff conducted group or individual video education with verbal and written material and guidebook.  Patient learns  that lifestyle modifications can be just as effective, if not more so, as many medications for lowering your risk of heart disease. A Pritikin lifestyle can help to reduce your risk of inflammation and atherosclerosis (cholesterol build-up, or plaque, in the artery walls). Lifestyle interventions such as dietary choices and physical activity address the cause of atherosclerosis. A review of the types of fats and their impact on blood cholesterol levels, along with dietary recommendations to reduce fat intake is also included.  Nutrition Action Plan  Clinical staff conducted group or individual video education with verbal and written material and guidebook.  Patient learns how to incorporate Pritikin recommendations into their lifestyle. Recommendations include planning and keeping personal health goals in mind as an important part of their success.  Healthy Mind-Set    Healthy Minds, Bodies, Hearts  Clinical staff conducted group or individual video education with verbal and written material and guidebook.  Patient learns how to identify when they are stressed. Video will discuss the impact of that stress, as well as the many benefits of stress management. Patient will also be introduced to stress management techniques. The way we think, act, and feel has an impact on our hearts.  How Our Thoughts Can Heal Our Hearts  Clinical staff conducted group or individual video education with verbal and written material and guidebook.  Patient learns that negative thoughts can cause depression and anxiety. This can result in negative lifestyle behavior and serious health problems. Cognitive behavioral therapy is an effective method to help control our thoughts in order to change and improve our emotional outlook.  Additional Videos:  Exercise    Improving Performance  Clinical staff conducted group or individual video education with verbal and written material and guidebook.  Patient learns to use a  non-linear approach by alternating intensity levels and lengths of time spent exercising to  help burn more calories and lose more body fat. Cardiovascular exercise helps improve heart health, metabolism, hormonal balance, blood sugar control, and recovery from fatigue. Resistance training improves strength, endurance, balance, coordination, reaction time, metabolism, and muscle mass. Flexibility exercise improves circulation, posture, and balance. Seek guidance from your physician and exercise physiologist before implementing an exercise routine and learn your capabilities and proper form for all exercise.  Introduction to Yoga  Clinical staff conducted group or individual video education with verbal and written material and guidebook.  Patient learns about yoga, a discipline of the coming together of mind, breath, and body. The benefits of yoga include improved flexibility, improved range of motion, better posture and core strength, increased lung function, weight loss, and positive self-image. Yoga's heart health benefits include lowered blood pressure, healthier heart rate, decreased cholesterol and triglyceride levels, improved immune function, and reduced stress. Seek guidance from your physician and exercise physiologist before implementing an exercise routine and learn your capabilities and proper form for all exercise.  Medical   Aging: Enhancing Your Quality of Life  Clinical staff conducted group or individual video education with verbal and written material and guidebook.  Patient learns key strategies and recommendations to stay in good physical health and enhance quality of life, such as prevention strategies, having an advocate, securing a [place], and keeping a list of medications and system for tracking them. It also discusses how to avoid risk for bone loss.  Biology of Weight Control  Clinical staff conducted group or individual video education with  verbal and written material and guidebook.  Patient learns that weight gain occurs because we consume more calories than we burn (eating more, moving less). Even if your body weight is normal, you may have higher ratios of fat compared to muscle mass. Too much body fat puts you at increased risk for cardiovascular disease, heart attack, stroke, type 2 diabetes, and obesity-related cancers. In addition to exercise, following the [place] can help reduce your risk.  Decoding Lab Results  Clinical staff conducted group or individual video education with verbal and written material and guidebook.  Patient learns that lab test reflects one measurement whose values change over time and are influenced by many factors, including medication, stress, sleep, exercise, food, hydration, pre-existing medical conditions, and more. It is recommended to use the knowledge from this video to become more involved with your lab results and evaluate your numbers to speak with your doctor.   Diseases of Our Time - Overview  Clinical staff conducted group or individual video education with verbal and written material and guidebook.  Patient learns that according to the CDC, 50% to 70% of chronic diseases (such as obesity, type 2 diabetes, elevated lipids, hypertension, and heart disease) are avoidable through lifestyle improvements including healthier food choices, listening to satiety cues, and increased physical activity.  Sleep Disorders Clinical staff conducted group or individual video education with verbal and written material and guidebook.  Patient learns how good quality and duration of sleep are important to overall health and well-being. Patient also learns about sleep disorders and how they impact health along with recommendations to address them, including discussing with a physician.  Nutrition  Dining Out - Part 2 Clinical staff conducted group or individual video education with verbal and  written material and guidebook.  Patient learns how to plan ahead and communicate in order to maximize their dining experience in a healthy and nutritious manner. Included are recommended  food choices based on the type of restaurant the patient is visiting.   Fueling a Best boy conducted group or individual video education with verbal and written material and guidebook.  There is a strong connection between our food choices and our health. Diseases like obesity and type 2 diabetes are very prevalent and are in large-part due to lifestyle choices. The Pritikin Eating Plan provides plenty of food and hunger-curbing satisfaction. It is easy to follow, affordable, and helps reduce health risks.  Menu Workshop  Clinical staff conducted group or individual video education with verbal and written material and guidebook.  Patient learns that restaurant meals can sabotage health goals because they are often packed with calories, fat, sodium, and sugar. Recommendations include strategies to plan ahead and to communicate with the manager, chef, or server to help order a healthier meal.  Planning Your Eating Strategy  Clinical staff conducted group or individual video education with verbal and written material and guidebook.  Patient learns about the [place] and its benefit of reducing the risk of disease. The [place] does not focus on calories. Instead, it emphasizes high-quality, nutrient-rich foods. By knowing the characteristics of the foods, we choose, we can determine their calorie density and make informed decisions.  Targeting Your Nutrition Priorities  Clinical staff conducted group or individual video education with verbal and written material and guidebook.  Patient learns that lifestyle habits have a tremendous impact on disease risk and progression. This video provides eating and physical activity recommendations based on your personal health goals,  such as reducing LDL cholesterol, losing weight, preventing or controlling type 2 diabetes, and reducing high blood pressure.  Vitamins and Minerals  Clinical staff conducted group or individual video education with verbal and written material and guidebook.  Patient learns different ways to obtain key vitamins and minerals, including through a recommended healthy diet. It is important to discuss all supplements you take with your doctor.   Healthy Mind-Set    Smoking Cessation  Clinical staff conducted group or individual video education with verbal and written material and guidebook.  Patient learns that cigarette smoking and tobacco addiction pose a serious health risk which affects millions of people. Stopping smoking will significantly reduce the risk of heart disease, lung disease, and many forms of cancer. Recommended strategies for quitting are covered, including working with your doctor to develop a successful plan.  Culinary   Becoming a Financial trader conducted group or individual video education with verbal and written material and guidebook.  Patient learns that cooking at home can be healthy, cost-effective, quick, and puts them in control. Keys to cooking healthy recipes will include looking at your recipe, assessing your equipment needs, planning ahead, making it simple, choosing cost-effective seasonal ingredients, and limiting the use of added fats, salts, and sugars.  Cooking - Breakfast and Snacks  Clinical staff conducted group or individual video education with verbal and written material and guidebook.  Patient learns how important breakfast is to satiety and nutrition through the entire day. Recommendations include key foods to eat during breakfast to help stabilize blood sugar levels and to prevent overeating at meals later in the day. Planning ahead is also a key component.  Cooking - Human resources officer conducted group or individual video  education with verbal and written material and guidebook.  Patient learns eating strategies to improve overall health, including an approach to cook more at home. Recommendations include  thinking of animal protein as a side on your plate rather than center stage and focusing instead on lower calorie dense options like vegetables, fruits, whole grains, and plant-based proteins, such as beans. Making sauces in large quantities to freeze for later and leaving the skin on your vegetables are also recommended to maximize your experience.  Cooking - Healthy Salads and Dressing Clinical staff conducted group or individual video education with verbal and written material and guidebook.  Patient learns that vegetables, fruits, whole grains, and legumes are the foundations of the [place]. Recommendations include how to incorporate each of these in flavorful and healthy salads, and how to create homemade salad dressings. Proper handling of ingredients is also covered. Cooking - Soups and [employer] - Soups and Desserts Clinical staff conducted group or individual video education with verbal and written material and guidebook.  Patient learns that Pritikin soups and desserts make for easy, nutritious, and delicious snacks and meal components that are low in sodium, fat, sugar, and calorie density, while high in vitamins, minerals, and filling fiber. Recommendations include simple and healthy ideas for soups and desserts.   Overview     The Pritikin Solution Program Overview Clinical staff conducted group or individual video education with verbal and written material and guidebook.  Patient learns that the results of the [place] Program have been documented in more than 100 articles published in peer-reviewed journals, and the benefits include reducing risk factors for (and, in some cases, even reversing) high cholesterol, high blood pressure, type 2 diabetes, obesity, and more! An overview of  the three key pillars of the Pritikin Program will be covered: eating well, doing regular exercise, and having a healthy mind-set.  WORKSHOPS  Exercise: Exercise Basics: Building Your Action Plan Clinical staff led group instruction and group discussion with PowerPoint presentation and patient guidebook. To enhance the learning environment the use of posters, models and videos may be added. At the conclusion of this workshop, patients will comprehend the difference between physical activity and exercise, as well as the benefits of incorporating both, into their routine. Patients will understand the FITT (Frequency, Intensity, Time, and Type) principle and how to use it to build an exercise action plan. In addition, safety concerns and other considerations for exercise and cardiac rehab will be addressed by the presenter. The purpose of this lesson is to promote a comprehensive and effective weekly exercise routine in order to improve patients' overall level of fitness.   Managing Heart Disease: Your Path to a Healthier Heart Clinical staff led group instruction and group discussion with PowerPoint presentation and patient guidebook. To enhance the learning environment the use of posters, models and videos may be added.At the conclusion of this workshop, patients will understand the anatomy and physiology of the heart. Additionally, they will understand how Pritikin's three pillars impact the risk factors, the progression, and the management of heart disease.  The purpose of this lesson is to provide a high-level overview of the heart, heart disease, and how the Pritikin lifestyle positively impacts risk factors.  Exercise Biomechanics Clinical staff led group instruction and group discussion with PowerPoint presentation and patient guidebook. To enhance the learning environment the use of posters, models and videos may be added. Patients will learn how the structural parts of their bodies  function and how these functions impact their daily activities, movement, and exercise. Patients will learn how to promote a neutral spine, learn how to manage pain, and identify ways to improve their  physical movement in order to promote healthy living. The purpose of this lesson is to expose patients to common physical limitations that impact physical activity. Participants will learn practical ways to adapt and manage aches and pains, and to minimize their effect on regular exercise. Patients will learn how to maintain good posture while sitting, walking, and lifting.  Balance Training and Fall Prevention  Clinical staff led group instruction and group discussion with PowerPoint presentation and patient guidebook. To enhance the learning environment the use of posters, models and videos may be added. At the conclusion of this workshop, patients will understand the importance of their sensorimotor skills (vision, proprioception, and the vestibular system) in maintaining their ability to balance as they age. Patients will apply a variety of balancing exercises that are appropriate for their current level of function. Patients will understand the common causes for poor balance, possible solutions to these problems, and ways to modify their physical environment in order to minimize their fall risk. The purpose of this lesson is to teach patients about the importance of maintaining balance as they age and ways to minimize their risk of falling.  WORKSHOPS   Nutrition:  Fueling a Scientist, research (physical sciences) led group instruction and group discussion with PowerPoint presentation and patient guidebook. To enhance the learning environment the use of posters, models and videos may be added. Patients will review the foundational principles of the [place] and understand what constitutes a serving size in each of the food groups. Patients will also learn Pritikin-friendly foods that are better  choices when away from home and review make-ahead meal and snack options. Calorie density will be reviewed and applied to three nutrition priorities: weight maintenance, weight loss, and weight gain. The purpose of this lesson is to reinforce (in a group setting) the key concepts around what patients are recommended to eat and how to apply these guidelines when away from home by planning and selecting Pritikin-friendly options. Patients will understand how calorie density may be adjusted for different weight management goals.  Mindful Eating  Clinical staff led group instruction and group discussion with PowerPoint presentation and patient guidebook. To enhance the learning environment the use of posters, models and videos may be added. Patients will briefly review the concepts of the [place] and the importance of low-calorie dense foods. The concept of mindful eating will be introduced as well as the importance of paying attention to internal hunger signals. Triggers for non-hunger eating and techniques for dealing with triggers will be explored. The purpose of this lesson is to provide patients with the opportunity to review the basic principles of the [place], discuss the value of eating mindfully and how to measure internal cues of hunger and fullness using the Hunger Scale. Patients will also discuss reasons for non-hunger eating and learn strategies to use for controlling emotional eating.  Targeting Your Nutrition Priorities Clinical staff led group instruction and group discussion with PowerPoint presentation and patient guidebook. To enhance the learning environment the use of posters, models and videos may be added. Patients will learn how to determine their genetic susceptibility to disease by reviewing their family history. Patients will gain insight into the importance of diet as part of an overall healthy lifestyle in mitigating the impact of genetics and other  environmental insults. The purpose of this lesson is to provide patients with the opportunity to assess their personal nutrition priorities by looking at their family history, their own health history and current  risk factors. Patients will also be able to discuss ways of prioritizing and modifying the [place] for their highest risk areas  Menu  Clinical staff led group instruction and group discussion with PowerPoint presentation and patient guidebook. To enhance the learning environment the use of posters, models and videos may be added. Using menus brought in from [employer], or printed from [employer], patients will apply the [place] dining out guidelines that were presented in the [employer] video. Patients will also be able to practice these guidelines in a variety of provided scenarios. The purpose of this lesson is to provide patients with the opportunity to practice hands-on learning of the [place] with actual menus and practice scenarios.  Label Reading Clinical staff led group instruction and group discussion with PowerPoint presentation and patient guidebook. To enhance the learning environment the use of posters, models and videos may be added. Patients will review and discuss the Pritikin label reading guidelines presented in Pritikin's Label Reading Educational series video. Using fool labels brought in from local grocery stores and markets, patients will apply the label reading guidelines and determine if the packaged food meet the Pritikin guidelines. The purpose of this lesson is to provide patients with the opportunity to review, discuss, and practice hands-on learning of the Pritikin Label Reading guidelines with actual packaged food labels. [place] Workshops are designed to teach patients ways to prepare quick, simple, and affordable recipes at home. The importance of nutrition's role in  chronic disease risk reduction is reflected in its emphasis in the overall Pritikin program. By learning how to prepare essential core Pritikin Eating Plan recipes, patients will increase control over what they eat; be able to customize the flavor of foods without the use of added salt, sugar, or fat; and improve the quality of the food they consume. By learning a set of core recipes which are easily assembled, quickly prepared, and affordable, patients are more likely to prepare more healthy foods at home. These workshops focus on convenient breakfasts, simple entres, side dishes, and desserts which can be prepared with minimal effort and are consistent with nutrition recommendations for cardiovascular risk reduction. Cooking [employer] are taught by a Engineer, materials (RD) who has been trained by the [employer]. The chef or RD has a clear understanding of the importance of minimizing - if not completely eliminating - added fat, sugar, and sodium in recipes. Throughout the series of [place] Workshop sessions, patients will learn about healthy ingredients and efficient methods of cooking to build confidence in their capability to prepare    Cooking School weekly topics:  Adding Flavor- Sodium-Free  Fast and Healthy Breakfasts  Powerhouse Plant-Based Proteins  Satisfying Salads and Dressings  Simple Sides and Sauces  International Cuisine-Spotlight on the [place] Zones  Delicious Desserts  Savory Soups  Efficiency Cooking - Meals in a Snap  Tasty Appetizers and Snacks  Comforting Weekend Breakfasts  One-Pot Wonders   Fast Evening Meals  Easy [place] (Psychosocial): New Thoughts, New Behaviors Clinical staff led group instruction and group discussion with PowerPoint presentation and patient guidebook. To enhance the learning environment the use of posters, models and videos may be added.  Patients will learn and practice techniques for developing effective health and lifestyle goals. Patients will be able to effectively apply the goal setting process learned to develop at least  one new personal goal.  The purpose of this lesson is to expose patients to a new skill set of behavior modification techniques such as techniques setting SMART goals, overcoming barriers, and achieving new thoughts and new behaviors.  Managing Moods and Relationships Clinical staff led group instruction and group discussion with PowerPoint presentation and patient guidebook. To enhance the learning environment the use of posters, models and videos may be added. Patients will learn how emotional and chronic stress factors can impact their health and relationships. They will learn healthy ways to manage their moods and utilize positive coping mechanisms. In addition, ICR patients will learn ways to improve communication skills. The purpose of this lesson is to expose patients to ways of understanding how one's mood and health are intimately connected. Developing a healthy outlook can help build positive relationships and connections with others. Patients will understand the importance of utilizing effective communication skills that include actively listening and being heard. They will learn and understand the importance of the "4 Cs" and especially Connections in fostering of a Healthy Mind-Set.  Healthy Sleep for a Healthy Heart Clinical staff led group instruction and group discussion with PowerPoint presentation and patient guidebook. To enhance the learning environment the use of posters, models and videos may be added. At the conclusion of this workshop, patients will be able to demonstrate knowledge of the importance of sleep to overall health, well-being, and quality of life. They will understand the symptoms of, and treatments for, common sleep disorders. Patients will also be able to identify daytime and  nighttime behaviors which impact sleep, and they will be able to apply these tools to help manage sleep-related challenges. The purpose of this lesson is to provide patients with a general overview of sleep and outline the importance of quality sleep. Patients will learn about a few of the most common sleep disorders. Patients will also be introduced to the concept of "sleep hygiene," and discover ways to self-manage certain sleeping problems through simple daily behavior changes. Finally, the workshop will motivate patients by clarifying the links between quality sleep and their goals of heart-healthy living.   Recognizing and Reducing Stress Clinical staff led group instruction and group discussion with PowerPoint presentation and patient guidebook. To enhance the learning environment the use of posters, models and videos may be added. At the conclusion of this workshop, patients will be able to understand the types of stress reactions, differentiate between acute and chronic stress, and recognize the impact that chronic stress has on their health. They will also be able to apply different coping mechanisms, such as reframing negative self-talk. Patients will have the opportunity to practice a variety of stress management techniques, such as deep abdominal breathing, progressive muscle relaxation, and/or guided imagery.  The purpose of this lesson is to educate patients on the role of stress in their lives and to provide healthy techniques for coping with it.  Learning Barriers/Preferences:  Learning Barriers/Preferences - 07/27/22 1509       Learning Barriers/Preferences   Learning Barriers Sight   pt wears glasses   Learning Preferences Computer/Internet;Group Instruction;Individual Instruction;Skilled Demonstration;Verbal Instruction             Education Topics:  Knowledge Questionnaire Score:  Knowledge Questionnaire Score - 07/27/22 1017       Knowledge Questionnaire Score   Pre  Score 20/24             Core Components/Risk Factors/Patient Goals at Admission:  Personal Goals and Risk Factors at Admission -  07/27/22 1510       Core Components/Risk Factors/Patient Goals on Admission    Weight Management Yes;Weight Maintenance    Intervention Weight Management: Develop a combined nutrition and exercise program designed to reach desired caloric intake, while maintaining appropriate intake of nutrient and fiber, sodium and fats, and appropriate energy expenditure required for the weight goal.;Weight Management: Provide education and appropriate resources to help participant work on and attain dietary goals.    Admit Weight 205 lb 14.6 oz (93.4 kg)    Diabetes Yes    Intervention Provide education about signs/symptoms and action to take for hypo/hyperglycemia.;Provide education about proper nutrition, including hydration, and aerobic/resistive exercise prescription along with prescribed medications to achieve blood glucose in normal ranges: Fasting glucose 65-99 mg/dL    Expected Outcomes Short Term: Participant verbalizes understanding of the signs/symptoms and immediate care of hyper/hypoglycemia, proper foot care and importance of medication, aerobic/resistive exercise and nutrition plan for blood glucose control.;Long Term: Attainment of HbA1C < 7%.    Hypertension Yes    Intervention Provide education on lifestyle modifcations including regular physical activity/exercise, weight management, moderate sodium restriction and increased consumption of fresh fruit, vegetables, and low fat dairy, alcohol moderation, and smoking cessation.;Monitor prescription use compliance.    Expected Outcomes Short Term: Continued assessment and intervention until BP is < 140/53m HG in hypertensive participants. < 130/878mHG in hypertensive participants with diabetes, heart failure or chronic kidney disease.;Long Term: Maintenance of blood pressure at goal levels.    Lipids Yes     Intervention Provide education and support for participant on nutrition & aerobic/resistive exercise along with prescribed medications to achieve LDL '70mg'$ , HDL >'40mg'$ .    Expected Outcomes Short Term: Participant states understanding of desired cholesterol values and is compliant with medications prescribed. Participant is following exercise prescription and nutrition guidelines.;Long Term: Cholesterol controlled with medications as prescribed, with individualized exercise RX and with personalized nutrition plan. Value goals: LDL < '70mg'$ , HDL > 40 mg.    Stress Yes    Intervention Offer individual and/or small group education and counseling on adjustment to heart disease, stress management and health-related lifestyle change. Teach and support self-help strategies.;Refer participants experiencing significant psychosocial distress to appropriate mental health specialists for further evaluation and treatment. When possible, include family members and significant others in education/counseling sessions.    Expected Outcomes Short Term: Participant demonstrates changes in health-related behavior, relaxation and other stress management skills, ability to obtain effective social support, and compliance with psychotropic medications if prescribed.;Long Term: Emotional wellbeing is indicated by absence of clinically significant psychosocial distress or social isolation.             Core Components/Risk Factors/Patient Goals Review:    Core Components/Risk Factors/Patient Goals at Discharge (Final Review):    ITP Comments:  ITP Comments     Row Name 07/27/22 1037           ITP Comments 30 day ITP Review. Introduction to [facility]/ Intensive Cardiac Rehab. Initial Orientation Packet Reviewed with the patient                Comments: Participant attended orientation for the cardiac rehabilitation program on  07/27/2022  to perform initial intake and exercise walk test. [place]  [place] and [place] packet was reviewed. Completed 6-minute walk test, measurements, initial ITP, and exercise prescription. Vital signs stable. Telemetry-chronic atrial fibrillation, occasional PVC this has been previously documented  asymptomatic. Tom di have 3/10 bilateral hip pain  that resolved with rest.Shrinika Blatz [name] RN BSN   Service time was from 0930 to 1155.

## 2022-07-27 NOTE — Progress Notes (Addendum)
Cardiac Rehab Medication Review by a Nurse  Does the patient  feel that his/her medications are working for him/her?  yes  Has the patient been experiencing any side effects to the medications prescribed?  no  Does the patient measure his/her own blood pressure or blood glucose at home?  yes   Does the patient have any problems obtaining medications due to transportation or finances?   no  Understanding of regimen: excellent Understanding of indications: excellent Potential of compliance: excellent    Nurse comments: Garrett Jordan checks his CBG's twice a day and blood pressure once to twice a week. Garrett Jordan has an good excellent understanding of what his medications are for. Updated over the counter medications.Garrett Jordan Garrett Dirr RN 07/27/2022 10:56 AM

## 2022-07-31 ENCOUNTER — Encounter (HOSPITAL_COMMUNITY)
Admission: RE | Admit: 2022-07-31 | Discharge: 2022-07-31 | Disposition: A | Discharge status: Home / Self Care | Payer: Medicare Other | Source: Ambulatory Visit | Attending: Cardiology | Admitting: Cardiology

## 2022-07-31 DIAGNOSIS — I2129 ST elevation (STEMI) myocardial infarction involving other sites: Secondary | ICD-10-CM

## 2022-07-31 DIAGNOSIS — Z4889 Encounter for other specified surgical aftercare: Secondary | ICD-10-CM | POA: Diagnosis not present

## 2022-07-31 DIAGNOSIS — I252 Old myocardial infarction: Secondary | ICD-10-CM | POA: Diagnosis not present

## 2022-07-31 LAB — GLUCOSE, CAPILLARY
Glucose-Capillary: 213 mg/dL — ABNORMAL HIGH (ref 70–99)
Glucose-Capillary: 214 mg/dL — ABNORMAL HIGH (ref 70–99)

## 2022-07-31 NOTE — Progress Notes (Signed)
Cardiac Individual Treatment Plan  Patient Details  Name: Garrett POPP Sr. MRN: [id number] Date of Birth: 07/19/1941 Referring Provider:   Flowsheet Row INTENSIVE CARDIAC REHAB ORIENT from 07/27/2022 in [facility]  Referring Provider Dr Candee Furbish, MD       Initial Encounter Date:  [facility] from 07/27/2022 in [place]  Date 07/27/22       Visit Diagnosis: 06/15/22 STEMI, unsuccessful PCI, SVG  Patient's Home Medications on Admission:  Current Outpatient Medications:    ACCU-CHEK GUIDE test strip, USE TWICE DAILY, Disp: 200 strip, Rfl: 3   acetaminophen (TYLENOL) 650 MG CR tablet, Take 650 mg by mouth every 8 (eight) hours as needed for pain., Disp: , Rfl:    allopurinol (ZYLOPRIM) 300 MG tablet, Take 300 mg by mouth every other day. Take in the evening - on even days of the month, Disp: , Rfl:    aspirin 81 MG chewable tablet, Chew 1 tablet (81 mg total) by mouth daily. Take for 30 days and then stop. (Patient not taking: Reported on 07/27/2022), Disp: , Rfl:    atorvastatin (LIPITOR) 40 MG tablet, Take 1 tablet (40 mg total) by mouth daily., Disp: 30 tablet, Rfl: 11   Cholecalciferol (VITAMIN D3) 2000 UNITS TABS, Take 2,000 Units by mouth daily. , Disp: , Rfl:    clonazePAM (KLONOPIN) 1 MG tablet, Take 0.5 mg by mouth at bedtime., Disp: , Rfl:    clopidogrel (PLAVIX) 75 MG tablet, TAKE 1 TABLET BY MOUTH  DAILY (Patient taking differently: Take 75 mg by mouth daily.), Disp: 90 tablet, Rfl: 3   Coenzyme Q10 300 MG CAPS, Take 300 mg by mouth daily., Disp: , Rfl:    Cyanocobalamin (VITAMIN B-12 PO), Take 2,500 mcg by mouth daily., Disp: , Rfl:    ELIQUIS 5 MG TABS tablet, TAKE 1 TABLET BY MOUTH TWICE A DAY (Patient taking differently: Take 5 mg by mouth 2 (two) times daily.), Disp: 180 tablet, Rfl: 1   ezetimibe (ZETIA) 10 MG tablet, TAKE 1 TABLET BY MOUTH  DAILY, Disp: 90 tablet, Rfl:  3   fenofibrate micronized (LOFIBRA) 200 MG capsule, Take 1 capsule (200 mg total) by mouth daily before breakfast., Disp: 90 capsule, Rfl: 3   Ferrous Sulfate Dried (HIGH POTENCY IRON) 65 MG TABS, Take 65 mg by mouth daily., Disp: , Rfl:    fluorouracil (EFUDEX) 5 % cream, Apply 1 Application topically daily as needed (cancer spots). Use as directed, Disp: , Rfl:    fluticasone (FLONASE) 50 MCG/ACT nasal spray, Place 2 sprays into both nostrils daily as needed for allergies., Disp: , Rfl:    glipiZIDE (GLUCOTROL) 5 MG tablet, Take 0.5 tablets (2.5 mg total) by mouth daily before breakfast AND 1 tablet (5 mg total) daily before supper., Disp: 135 tablet, Rfl: 3   levothyroxine (SYNTHROID, LEVOTHROID) 175 MCG tablet, Take 175 mcg by mouth daily before breakfast., Disp: , Rfl:    lidocaine (ASPERCREME LIDOCAINE) 4 %, Place 1 patch onto the skin daily as needed (pain)., Disp: , Rfl:    loratadine (CLARITIN) 10 MG tablet, Take 10 mg by mouth at bedtime., Disp: , Rfl:    magnesium oxide (MAG-OX) 400 (240 Mg) MG tablet, Take 400 mg by mouth daily., Disp: , Rfl:    metoprolol tartrate (LOPRESSOR) 25 MG tablet, TAKE 1 TABLET BY MOUTH  TWICE DAILY (Patient taking differently: Take 25 mg by mouth 2 (two) times daily.),  Disp: 180 tablet, Rfl: 3   Multiple Vitamins-Minerals (PRESERVISION AREDS 2) CAPS, Take 1 capsule by mouth 2 (two) times daily., Disp: , Rfl:    nitroGLYCERIN (NITROSTAT) 0.4 MG SL tablet, Place 1 tablet (0.4 mg total) under the tongue every 5 (five) minutes as needed for chest pain., Disp: 25 tablet, Rfl: 3   olmesartan-hydrochlorothiazide (BENICAR HCT) 20-12.5 MG tablet, Take 1 tablet by mouth daily., Disp: 90 tablet, Rfl: 3   pantoprazole (PROTONIX) 40 MG tablet, TAKE 1 TABLET BY MOUTH  DAILY, Disp: 90 tablet, Rfl: 2   Polyethyl Glycol-Propyl Glycol (SYSTANE OP), Place 1 drop into both eyes daily as needed (dry/irritated eyes)., Disp: , Rfl:    Tamsulosin HCl (FLOMAX) 0.4 MG CAPS, Take 0.4 mg  by mouth every other day. Take in the evening - on even days of the month, Disp: , Rfl:    traMADol (ULTRAM) 50 MG tablet, Take 50 mg by mouth as needed., Disp: , Rfl:    triamcinolone cream (KENALOG) 0.1 %, Apply 1 application  topically daily as needed (rash)., Disp: , Rfl: 2   TURMERIC PO, Take 2 tablets by mouth in the morning and at bedtime., Disp: , Rfl:    VASCEPA 1 g capsule, TAKE 2 CAPSULES BY MOUTH 2 TIMES DAILY., Disp: 360 capsule, Rfl: 3  Past Medical History: Past Medical History:  Diagnosis Date   AAA (abdominal aortic aneurysm) ([place])    Arthritis    knees   Atrial fibrillation (HCC)    CAD (coronary artery disease)    Cancer (HCC)    prostate   CKD (chronic kidney disease), stage III ([place]) 06/18/2022   COVID    06/2019 and in 2022 states they were mild cases   Diabetes mellitus without complication (HCC)    Dysrhythmia    A-fib pt is on Eliquis   GERD (gastroesophageal reflux disease)    Gout    History of kidney stones    Hyperlipidemia    Hypertension    Hypothyroidism    Myocardial infarction ([place]) 01/1991   sp inferior   Nerve compression    right leg   Reflux    Seasonal allergies    Thoracic ascending aortic aneurysm (HCC)    Thyroid disease    hypothyroidism    Tobacco Use: Social History   Tobacco Use  Smoking Status Former   Types: Cigarettes   Quit date: 08/01/1970   Years since quitting: 52.0  Smokeless Tobacco Never    Labs: Review Flowsheet  More data exists      Latest Ref Rng & Units 07/15/2021 09/29/2021 03/30/2022 06/13/2022 06/15/2022  Labs for ITP Cardiac and Pulmonary Rehab  Cholestrol 0 - 200 mg/dL 137  - - - 149   LDL (calc) 0 - 99 mg/dL 64  - - - 45   Direct LDL 0 - 99 mg/dL 67  - - - -  HDL-C >40 mg/dL 29  - - - 30   Trlycerides <150 mg/dL 278  - - - 372   Hemoglobin A1c 4.8 - 5.6 % - 6.9  7.0  6.7  6.7     Capillary Blood Glucose: Lab Results  Component Value Date   GLUCAP 214 (H) 07/31/2022   GLUCAP 213 (H) 07/31/2022    GLUCAP 140 (H) 06/18/2022   GLUCAP 173 (H) 06/18/2022   GLUCAP 128 (H) 06/17/2022     Exercise Target Goals: Exercise Program Goal: Individual exercise prescription set using results from initial 6 min walk test and  THRR while considering  patient's activity barriers and safety.   Exercise Prescription Goal: Initial exercise prescription builds to 30-45 minutes a day of aerobic activity, 2-3 days per week.  Home exercise guidelines will be given to patient during program as part of exercise prescription that the participant will acknowledge.  Activity Barriers & Risk Stratification:  Activity Barriers & Cardiac Risk Stratification - 07/27/22 1504       Activity Barriers & Cardiac Risk Stratification   Activity Barriers Back Problems;Neck/Spine Problems;Joint Problems;Deconditioning;Balance Concerns    Cardiac Risk Stratification High   2.54 mets on 6MWT            6 Minute Walk:  6 Minute Walk     Row Name 07/27/22 1307         6 Minute Walk   Phase Initial     Distance 1448 feet     Walk Time 6 minutes     # of Rest Breaks 0     MPH 2.74     METS 2.54     RPE 11     Perceived Dyspnea  1     VO2 Peak 8.9     Symptoms Yes (comment)     Comments bilateral hip pain, 3/10. resolved with rest     Resting HR 71 bpm     Resting BP 118/72     Resting Oxygen Saturation  97 %     Exercise Oxygen Saturation  during 6 min walk 97 %     Max Ex. HR 95 bpm     Max Ex. BP 140/82     2 Minute Post BP 126/70              Oxygen Initial Assessment:   Oxygen Re-Evaluation:   Oxygen Discharge (Final Oxygen Re-Evaluation):   Initial Exercise Prescription:  Initial Exercise Prescription - 07/27/22 1500       Date of Initial Exercise RX and Referring Provider   Date 07/27/22    Referring Provider Dr Candee Furbish, MD    Expected Discharge Date 09/22/22      Recumbant Bike   Level 2    RPM 60    Minutes 15    METs 2.2      NuStep   Level 2    SPM 75     Minutes 15    METs 2.2      Prescription Details   Frequency (times per week) 3    Duration Progress to 30 minutes of continuous aerobic without signs/symptoms of physical distress      Intensity   THRR 40-80% of Max Heartrate 56-113    Ratings of Perceived Exertion 11-13    Perceived Dyspnea 0-4      Progression   Progression Continue progressive overload as per policy without signs/symptoms or physical distress.      Resistance Training   Training Prescription Yes    Weight 3    Reps 10-15             Perform Capillary Blood Glucose checks as needed.  Exercise Prescription Changes:   Exercise Prescription Changes     Row Name 07/31/22 1100             Response to Exercise   Blood Pressure (Admit) 122/70       Blood Pressure (Exercise) 128/68       Blood Pressure (Exit) 120/60       Heart Rate (Admit) 71 bpm  Heart Rate (Exercise) 113 bpm       Heart Rate (Exit) 69 bpm       Rating of Perceived Exertion (Exercise) 12       Symptoms None       Comments Pt's first day in the cRP2 program       Duration Continue with 30 min of aerobic exercise without signs/symptoms of physical distress.       Intensity THRR unchanged         Progression   Progression Continue to progress workloads to maintain intensity without signs/symptoms of physical distress.       Average METs 2.45         Resistance Training   Training Prescription Yes       Weight 3       Reps 10-15       Time 10 Minutes         Interval Training   Interval Training No         Recumbant Bike   Level 2       Minutes 15       METs 2.2         NuStep   Level 2       SPM 102       Minutes 15       METs 2.7                Exercise Comments:   Exercise Comments     Row Name 07/31/22 1158           Exercise Comments Pt's first day in the CRP2 program. No complaints with todays session.                Exercise Goals and Review:   Exercise Goals     Row Name  07/27/22 1507             Exercise Goals   Increase Physical Activity Yes       Intervention Provide advice, education, support and counseling about physical activity/exercise needs.;Develop an individualized exercise prescription for aerobic and resistive training based on initial evaluation findings, risk stratification, comorbidities and participant's personal goals.       Expected Outcomes Short Term: Attend rehab on a regular basis to increase amount of physical activity.;Long Term: Add in home exercise to make exercise part of routine and to increase amount of physical activity.;Long Term: Exercising regularly at least 3-5 days a week.       Increase Strength and Stamina Yes       Intervention Provide advice, education, support and counseling about physical activity/exercise needs.;Develop an individualized exercise prescription for aerobic and resistive training based on initial evaluation findings, risk stratification, comorbidities and participant's personal goals.       Expected Outcomes Short Term: Increase workloads from initial exercise prescription for resistance, speed, and METs.;Short Term: Perform resistance training exercises routinely during rehab and add in resistance training at home;Long Term: Improve cardiorespiratory fitness, muscular endurance and strength as measured by increased METs and functional capacity (6MWT)       Able to understand and use rate of perceived exertion (RPE) scale Yes       Intervention Provide education and explanation on how to use RPE scale       Expected Outcomes Short Term: Able to use RPE daily in rehab to express subjective intensity level;Long Term:  Able to use RPE to guide intensity level when exercising independently  Knowledge and understanding of Target Heart Rate Range (THRR) Yes       Intervention Provide education and explanation of THRR including how the numbers were predicted and where they are located for reference        Expected Outcomes Short Term: Able to state/look up THRR;Short Term: Able to use daily as guideline for intensity in rehab;Long Term: Able to use THRR to govern intensity when exercising independently       Understanding of Exercise Prescription Yes       Intervention Provide education, explanation, and written materials on patient's individual exercise prescription       Expected Outcomes Short Term: Able to explain program exercise prescription;Long Term: Able to explain home exercise prescription to exercise independently                Exercise Goals Re-Evaluation :  Exercise Goals Re-Evaluation     [place] Name 07/31/22 1157             Exercise Goal Re-Evaluation   Exercise Goals Review Increase Physical Activity;Increase Strength and Stamina;Able to understand and use rate of perceived exertion (RPE) scale;Knowledge and understanding of Target Heart Rate Range (THRR);Understanding of Exercise Prescription       Comments Pt's first day in the CRP2 program. Pt understands the exercise Rx, RPE sclae and THRR.       Expected Outcomes Will continue to monitor patient and progress exercise workloads as tolerated.                Discharge Exercise Prescription (Final Exercise Prescription Changes):  Exercise Prescription Changes - 07/31/22 1100       Response to Exercise   Blood Pressure (Admit) 122/70    Blood Pressure (Exercise) 128/68    Blood Pressure (Exit) 120/60    Heart Rate (Admit) 71 bpm    Heart Rate (Exercise) 113 bpm    Heart Rate (Exit) 69 bpm    Rating of Perceived Exertion (Exercise) 12    Symptoms None    Comments Pt's first day in the cRP2 program    Duration Continue with 30 min of aerobic exercise without signs/symptoms of physical distress.    Intensity THRR unchanged      Progression   Progression Continue to progress workloads to maintain intensity without signs/symptoms of physical distress.    Average METs 2.45      Resistance Training    Training Prescription Yes    Weight 3    Reps 10-15    Time 10 Minutes      Interval Training   Interval Training No      Recumbant Bike   Level 2    Minutes 15    METs 2.2      NuStep   Level 2    SPM 102    Minutes 15    METs 2.7             Nutrition:  Target Goals: Understanding of nutrition guidelines, daily intake of sodium '1500mg'$ , cholesterol '200mg'$ , calories 30% from fat and 7% or less from saturated fats, daily to have 5 or more servings of fruits and vegetables.  Biometrics:  Pre Biometrics - 07/27/22 1302       Pre Biometrics   Waist Circumference 43 inches    Hip Circumference 41.5 inches    Waist to Hip Ratio 1.04 %    Triceps Skinfold 9 mm    % Body Fat 27.8 %    Grip Strength 42  kg    Flexibility 13.5 in    Single Leg Stand 2 seconds              Nutrition Therapy Plan and Nutrition Goals:  Nutrition Therapy & Goals - 07/31/22 0955       Nutrition Therapy   Diet Heart Healthy Diet    Drug/Food Interactions Statins/Certain Fruits      Personal Nutrition Goals   Nutrition Goal Patient to identify strategies for managing cardiovascular risk by attending the weekly nutiriton educatoin and nutrition courses    Personal Goal #2 Patient to identify food sources and limit daily intake of saturated fat, trans fat, sodium, and refined carbohydrates    Personal Goal #3 Patient to reduce sodium to '1500mg'$  daily    Comments Patient has previously completed cardiac rehab and the maintenance program. Lipid panel remains elevated.      Intervention Plan   Intervention Prescribe, educate and counsel regarding individualized specific dietary modifications aiming towards targeted core components such as weight, hypertension, lipid management, diabetes, heart failure and other comorbidities.;Nutrition handout(s) given to patient.    Expected Outcomes Short Term Goal: Understand basic principles of dietary content, such as calories, fat, sodium, cholesterol  and nutrients.;Long Term Goal: Adherence to prescribed nutrition plan.             Nutrition Assessments:  Nutrition Assessments - 07/31/22 1508       Rate Your Plate Scores   Pre Score 43            MEDIFICTS Score Key: ?70 Need to make dietary changes  40-70 Heart Healthy Diet ? 40 Therapeutic Level Cholesterol Diet   Flowsheet Row INTENSIVE CARDIAC REHAB from 07/31/2022 in [facility]  Picture Your Plate Total Score on Admission 43      Picture Your Plate Scores: <44 Unhealthy dietary pattern with much room for improvement. 41-50 Dietary pattern unlikely to meet recommendations for good health and room for improvement. 51-60 More healthful dietary pattern, with some room for improvement.  >60 Healthy dietary pattern, although there may be some specific behaviors that could be improved.    Nutrition Goals Re-Evaluation:  Nutrition Goals Re-Evaluation     [place] Name 07/31/22 0955             Goals   Current Weight 203 lb 7.8 oz (92.3 kg)       Comment VLDL 74, HDL 30, triglycerides 372, GFR 51, CR 1.41, A1c 6.7       Expected Outcome Patient has previously completed cardiac rehab and the maintenance program. Lipid panel remains elevated.                Nutrition Goals Re-Evaluation:  Nutrition Goals Re-Evaluation     [place] Name 07/31/22 0955             Goals   Current Weight 203 lb 7.8 oz (92.3 kg)       Comment VLDL 74, HDL 30, triglycerides 372, GFR 51, CR 1.41, A1c 6.7       Expected Outcome Patient has previously completed cardiac rehab and the maintenance program. Lipid panel remains elevated.                Nutrition Goals Discharge (Final Nutrition Goals Re-Evaluation):  Nutrition Goals Re-Evaluation - 07/31/22 0955       Goals   Current Weight 203 lb 7.8 oz (92.3 kg)    Comment VLDL 74, HDL 30, triglycerides 372, GFR 51,  CR 1.41, A1c 6.7    Expected Outcome Patient has previously completed cardiac  rehab and the maintenance program. Lipid panel remains elevated.             Psychosocial: Target Goals: Acknowledge presence or absence of significant depression and/or stress, maximize coping skills, provide positive support system. Participant is able to verbalize types and ability to use techniques and skills needed for reducing stress and depression.  Initial Review & Psychosocial Screening:  Initial Psych Review & Screening - 07/27/22 1212       Initial Review   Current issues with None Identified      Family Dynamics   Good Support System? Yes   Tom lives alone he has his Son for support. Tom's wife passed away 3 years ago     Barriers   Psychosocial barriers to participate in program There are no identifiable barriers or psychosocial needs.      Screening Interventions   Interventions Encouraged to exercise             Quality of Life Scores:  Quality of Life - 07/27/22 1508       Quality of Life   Select Quality of Life      Quality of Life Scores   Health/Function Pre 22.21 %    Socioeconomic Pre 23.71 %    Psych/Spiritual Pre 25.57 %    Family Pre 28.8 %    GLOBAL Pre 24.16 %            Scores of 19 and below usually indicate a poorer quality of life in these areas.  A difference of  2-3 points is a clinically meaningful difference.  A difference of 2-3 points in the total score of the Quality of Life Index has been associated with significant improvement in overall quality of life, self-image, physical symptoms, and general health in studies assessing change in quality of life.  PHQ-9: Review Flowsheet       07/27/2022  Depression screen PHQ 2/9  Decreased Interest 0  Down, Depressed, Hopeless 0  PHQ - 2 Score 0   Interpretation of Total Score  Total Score Depression Severity:  1-4 = Minimal depression, 5-9 = Mild depression, 10-14 = Moderate depression, 15-19 = Moderately severe depression, 20-27 = Severe depression   Psychosocial  Evaluation and Intervention:   Psychosocial Re-Evaluation:  Psychosocial Re-Evaluation     [place] Name 07/31/22 1121             Psychosocial Re-Evaluation   Current issues with None Identified       Interventions Encouraged to attend Cardiac Rehabilitation for the exercise       Continue Psychosocial Services  No Follow up required                Psychosocial Discharge (Final Psychosocial Re-Evaluation):  Psychosocial Re-Evaluation - 07/31/22 1121       Psychosocial Re-Evaluation   Current issues with None Identified    Interventions Encouraged to attend Cardiac Rehabilitation for the exercise    Continue Psychosocial Services  No Follow up required             Vocational Rehabilitation: Provide vocational rehab assistance to qualifying candidates.   Vocational Rehab Evaluation & Intervention:  Vocational Rehab - 07/27/22 1213       Initial Vocational Rehab Evaluation & Intervention   Assessment shows need for Vocational Rehabilitation No   [name] is retired and does not need vocational rehab at this time  Education: Education Goals: Education classes will be provided on a weekly basis, covering required topics. Participant will state understanding/return demonstration of topics presented.    Education     Row Name 07/31/22 0900     Education   Cardiac Education Topics Pritikin   Select Workshops     Workshops   Educator Exercise Physiologist   Select Exercise   Exercise Workshop Exercise Basics: Building Your Action Plan   Instruction Review Code 1- Verbalizes Understanding   Class Start Time [phone]   Class Stop Time 0855   Class Time Calculation (min) 43 min            Core Videos: Exercise    Move It!  Clinical staff conducted group or individual video education with verbal and written material and guidebook.  Patient learns the recommended Pritikin exercise program. Exercise with the goal of living a long, healthy life. Some  of the health benefits of exercise include controlled diabetes, healthier blood pressure levels, improved cholesterol levels, improved heart and lung capacity, improved sleep, and better body composition. Everyone should speak with their doctor before starting or changing an exercise routine.  Biomechanical Limitations Clinical staff conducted group or individual video education with verbal and written material and guidebook.  Patient learns how biomechanical limitations can impact exercise and how we can mitigate and possibly overcome limitations to have an impactful and balanced exercise routine.  Body Composition Clinical staff conducted group or individual video education with verbal and written material and guidebook.  Patient learns that body composition (ratio of muscle mass to fat mass) is a key component to assessing overall fitness, rather than body weight alone. Increased fat mass, especially visceral belly fat, can put [name] at increased risk for metabolic syndrome, type 2 diabetes, heart disease, and even death. It is recommended to combine diet and exercise (cardiovascular and resistance training) to improve your body composition. Seek guidance from your physician and exercise physiologist before implementing an exercise routine.  Exercise Action Plan Clinical staff conducted group or individual video education with verbal and written material and guidebook.  Patient learns the recommended strategies to achieve and enjoy long-term exercise adherence, including variety, self-motivation, self-efficacy, and positive decision making. Benefits of exercise include fitness, good health, weight management, more energy, better sleep, less stress, and overall well-being.  Medical   Heart Disease Risk Reduction Clinical staff conducted group or individual video education with verbal and written material and guidebook.  Patient learns our heart is our most vital organ as it circulates oxygen,  nutrients, white blood cells, and hormones throughout the entire body, and carries waste away. Data supports a plant-based eating plan like the Pritikin Program for its effectiveness in slowing progression of and reversing heart disease. The video provides a number of recommendations to address heart disease.   Metabolic Syndrome and Belly Fat  Clinical staff conducted group or individual video education with verbal and written material and guidebook.  Patient learns what metabolic syndrome is, how it leads to heart disease, and how one can reverse it and keep it from coming back. You have metabolic syndrome if you have 3 of the following 5 criteria: abdominal obesity, high blood pressure, high triglycerides, low HDL cholesterol, and high blood sugar.  Hypertension and Heart Disease Clinical staff conducted group or individual video education with verbal and written material and guidebook.  Patient learns that high blood pressure, or hypertension, is very common in the [place]. Hypertension is largely due to excessive salt intake,  but other important risk factors include being overweight, physical inactivity, drinking too much alcohol, smoking, and not eating enough potassium from fruits and vegetables. High blood pressure is a leading risk factor for heart attack, stroke, congestive heart failure, dementia, kidney failure, and premature death. Long-term effects of excessive salt intake include stiffening of the arteries and thickening of heart muscle and organ damage. Recommendations include ways to reduce hypertension and the risk of heart disease.  Diseases of Our Time - Focusing on Diabetes Clinical staff conducted group or individual video education with verbal and written material and guidebook.  Patient learns why the best way to stop diseases of our time is prevention, through food and other lifestyle changes. Medicine (such as prescription pills and surgeries) is often only a Band-Aid on  the problem, not a long-term solution. Most common diseases of our time include obesity, type 2 diabetes, hypertension, heart disease, and cancer. The Pritikin Program is recommended and has been proven to help reduce, reverse, and/or prevent the damaging effects of metabolic syndrome.  Nutrition   Overview of the Pritikin Eating Plan  Clinical staff conducted group or individual video education with verbal and written material and guidebook.  Patient learns about the [place] for disease risk reduction. The [place] emphasizes a wide variety of unrefined, minimally-processed carbohydrates, like fruits, vegetables, whole grains, and legumes. Go, Caution, and Stop food choices are explained. Plant-based and lean animal proteins are emphasized. Rationale provided for low sodium intake for blood pressure control, low added sugars for blood sugar stabilization, and low added fats and oils for coronary artery disease risk reduction and weight management.  Calorie Density  Clinical staff conducted group or individual video education with verbal and written material and guidebook.  Patient learns about calorie density and how it impacts the Pritikin Eating Plan. Knowing the characteristics of the food you choose will help you decide whether those foods will lead to weight gain or weight loss, and whether you want to consume more or less of them. Weight loss is usually a side effect of the Pritikin Eating Plan because of its focus on low calorie-dense foods.  Label Reading  Clinical staff conducted group or individual video education with verbal and written material and guidebook.  Patient learns about the Pritikin recommended label reading guidelines and corresponding recommendations regarding calorie density, added sugars, sodium content, and whole grains.  Dining Out - Part 1  Clinical staff conducted group or individual video education with verbal and written material and  guidebook.  Patient learns that restaurant meals can be sabotaging because they can be so high in calories, fat, sodium, and/or sugar. Patient learns recommended strategies on how to positively address this and avoid unhealthy pitfalls.  Facts on Fats  Clinical staff conducted group or individual video education with verbal and written material and guidebook.  Patient learns that lifestyle modifications can be just as effective, if not more so, as many medications for lowering your risk of heart disease. A Pritikin lifestyle can help to reduce your risk of inflammation and atherosclerosis (cholesterol build-up, or plaque, in the artery walls). Lifestyle interventions such as dietary choices and physical activity address the cause of atherosclerosis. A review of the types of fats and their impact on blood cholesterol levels, along with dietary recommendations to reduce fat intake is also included.  Nutrition Action Plan  Clinical staff conducted group or individual video education with verbal and written material and guidebook.  Patient learns how to incorporate  Pritikin recommendations into their lifestyle. Recommendations include planning and keeping personal health goals in mind as an important part of their success.  Healthy Mind-Set    Healthy Minds, Bodies, Hearts  Clinical staff conducted group or individual video education with verbal and written material and guidebook.  Patient learns how to identify when they are stressed. Video will discuss the impact of that stress, as well as the many benefits of stress management. Patient will also be introduced to stress management techniques. The way we think, act, and feel has an impact on our hearts.  How Our Thoughts Can Heal Our Hearts  Clinical staff conducted group or individual video education with verbal and written material and guidebook.  Patient learns that negative thoughts can cause depression and anxiety. This can result in negative  lifestyle behavior and serious health problems. Cognitive behavioral therapy is an effective method to help control our thoughts in order to change and improve our emotional outlook.  Additional Videos:  Exercise    Improving Performance  Clinical staff conducted group or individual video education with verbal and written material and guidebook.  Patient learns to use a non-linear approach by alternating intensity levels and lengths of time spent exercising to help burn more calories and lose more body fat. Cardiovascular exercise helps improve heart health, metabolism, hormonal balance, blood sugar control, and recovery from fatigue. Resistance training improves strength, endurance, balance, coordination, reaction time, metabolism, and muscle mass. Flexibility exercise improves circulation, posture, and balance. Seek guidance from your physician and exercise physiologist before implementing an exercise routine and learn your capabilities and proper form for all exercise.  Introduction to Yoga  Clinical staff conducted group or individual video education with verbal and written material and guidebook.  Patient learns about yoga, a discipline of the coming together of mind, breath, and body. The benefits of yoga include improved flexibility, improved range of motion, better posture and core strength, increased lung function, weight loss, and positive self-image. Yoga's heart health benefits include lowered blood pressure, healthier heart rate, decreased cholesterol and triglyceride levels, improved immune function, and reduced stress. Seek guidance from your physician and exercise physiologist before implementing an exercise routine and learn your capabilities and proper form for all exercise.  Medical   Aging: Enhancing Your Quality of Life  Clinical staff conducted group or individual video education with verbal and written material and guidebook.  Patient learns key strategies and recommendations  to stay in good physical health and enhance quality of life, such as prevention strategies, having an advocate, securing a [place], and keeping a list of medications and system for tracking them. It also discusses how to avoid risk for bone loss.  Biology of Weight Control  Clinical staff conducted group or individual video education with verbal and written material and guidebook.  Patient learns that weight gain occurs because we consume more calories than we burn (eating more, moving less). Even if your body weight is normal, you may have higher ratios of fat compared to muscle mass. Too much body fat puts you at increased risk for cardiovascular disease, heart attack, stroke, type 2 diabetes, and obesity-related cancers. In addition to exercise, following the [place] can help reduce your risk.  Decoding Lab Results  Clinical staff conducted group or individual video education with verbal and written material and guidebook.  Patient learns that lab test reflects one measurement whose values change over time and are influenced by many factors, including medication,  stress, sleep, exercise, food, hydration, pre-existing medical conditions, and more. It is recommended to use the knowledge from this video to become more involved with your lab results and evaluate your numbers to speak with your doctor.   Diseases of Our Time - Overview  Clinical staff conducted group or individual video education with verbal and written material and guidebook.  Patient learns that according to the CDC, 50% to 70% of chronic diseases (such as obesity, type 2 diabetes, elevated lipids, hypertension, and heart disease) are avoidable through lifestyle improvements including healthier food choices, listening to satiety cues, and increased physical activity.  Sleep Disorders Clinical staff conducted group or individual video education with verbal and written material and  guidebook.  Patient learns how good quality and duration of sleep are important to overall health and well-being. Patient also learns about sleep disorders and how they impact health along with recommendations to address them, including discussing with a physician.  Nutrition  Dining Out - Part 2 Clinical staff conducted group or individual video education with verbal and written material and guidebook.  Patient learns how to plan ahead and communicate in order to maximize their dining experience in a healthy and nutritious manner. Included are recommended food choices based on the type of restaurant the patient is visiting.   Fueling a Best boy conducted group or individual video education with verbal and written material and guidebook.  There is a strong connection between our food choices and our health. Diseases like obesity and type 2 diabetes are very prevalent and are in large-part due to lifestyle choices. The Pritikin Eating Plan provides plenty of food and hunger-curbing satisfaction. It is easy to follow, affordable, and helps reduce health risks.  Menu Workshop  Clinical staff conducted group or individual video education with verbal and written material and guidebook.  Patient learns that restaurant meals can sabotage health goals because they are often packed with calories, fat, sodium, and sugar. Recommendations include strategies to plan ahead and to communicate with the manager, chef, or server to help order a healthier meal.  Planning Your Eating Strategy  Clinical staff conducted group or individual video education with verbal and written material and guidebook.  Patient learns about the [place] and its benefit of reducing the risk of disease. The [place] does not focus on calories. Instead, it emphasizes high-quality, nutrient-rich foods. By knowing the characteristics of the foods, we choose, we can determine their calorie density  and make informed decisions.  Targeting Your Nutrition Priorities  Clinical staff conducted group or individual video education with verbal and written material and guidebook.  Patient learns that lifestyle habits have a tremendous impact on disease risk and progression. This video provides eating and physical activity recommendations based on your personal health goals, such as reducing LDL cholesterol, losing weight, preventing or controlling type 2 diabetes, and reducing high blood pressure.  Vitamins and Minerals  Clinical staff conducted group or individual video education with verbal and written material and guidebook.  Patient learns different ways to obtain key vitamins and minerals, including through a recommended healthy diet. It is important to discuss all supplements you take with your doctor.   Healthy Mind-Set    Smoking Cessation  Clinical staff conducted group or individual video education with verbal and written material and guidebook.  Patient learns that cigarette smoking and tobacco addiction pose a serious health risk which affects millions of people. Stopping smoking will significantly reduce the risk of  heart disease, lung disease, and many forms of cancer. Recommended strategies for quitting are covered, including working with your doctor to develop a successful plan.  Culinary   Becoming a Financial trader conducted group or individual video education with verbal and written material and guidebook.  Patient learns that cooking at home can be healthy, cost-effective, quick, and puts them in control. Keys to cooking healthy recipes will include looking at your recipe, assessing your equipment needs, planning ahead, making it simple, choosing cost-effective seasonal ingredients, and limiting the use of added fats, salts, and sugars.  Cooking - Breakfast and Snacks  Clinical staff conducted group or individual video education with verbal and written material  and guidebook.  Patient learns how important breakfast is to satiety and nutrition through the entire day. Recommendations include key foods to eat during breakfast to help stabilize blood sugar levels and to prevent overeating at meals later in the day. Planning ahead is also a key component.  Cooking - Human resources officer conducted group or individual video education with verbal and written material and guidebook.  Patient learns eating strategies to improve overall health, including an approach to cook more at home. Recommendations include thinking of animal protein as a side on your plate rather than center stage and focusing instead on lower calorie dense options like vegetables, fruits, whole grains, and plant-based proteins, such as beans. Making sauces in large quantities to freeze for later and leaving the skin on your vegetables are also recommended to maximize your experience.  Cooking - Healthy Salads and Dressing Clinical staff conducted group or individual video education with verbal and written material and guidebook.  Patient learns that vegetables, fruits, whole grains, and legumes are the foundations of the [place]. Recommendations include how to incorporate each of these in flavorful and healthy salads, and how to create homemade salad dressings. Proper handling of ingredients is also covered. Cooking - Soups and [employer] - Soups and Desserts Clinical staff conducted group or individual video education with verbal and written material and guidebook.  Patient learns that Pritikin soups and desserts make for easy, nutritious, and delicious snacks and meal components that are low in sodium, fat, sugar, and calorie density, while high in vitamins, minerals, and filling fiber. Recommendations include simple and healthy ideas for soups and desserts.   Overview     The Pritikin Solution Program Overview Clinical staff conducted group or individual video  education with verbal and written material and guidebook.  Patient learns that the results of the [place] Program have been documented in more than 100 articles published in peer-reviewed journals, and the benefits include reducing risk factors for (and, in some cases, even reversing) high cholesterol, high blood pressure, type 2 diabetes, obesity, and more! An overview of the three key pillars of the Pritikin Program will be covered: eating well, doing regular exercise, and having a healthy mind-set.  WORKSHOPS  Exercise: Exercise Basics: Building Your Action Plan Clinical staff led group instruction and group discussion with PowerPoint presentation and patient guidebook. To enhance the learning environment the use of posters, models and videos may be added. At the conclusion of this workshop, patients will comprehend the difference between physical activity and exercise, as well as the benefits of incorporating both, into their routine. Patients will understand the FITT (Frequency, Intensity, Time, and Type) principle and how to use it to build an exercise action plan. In addition, safety concerns and other considerations for  exercise and cardiac rehab will be addressed by the presenter. The purpose of this lesson is to promote a comprehensive and effective weekly exercise routine in order to improve patients' overall level of fitness.   Managing Heart Disease: Your Path to a Healthier Heart Clinical staff led group instruction and group discussion with PowerPoint presentation and patient guidebook. To enhance the learning environment the use of posters, models and videos may be added.At the conclusion of this workshop, patients will understand the anatomy and physiology of the heart. Additionally, they will understand how Pritikin's three pillars impact the risk factors, the progression, and the management of heart disease.  The purpose of this lesson is to provide a high-level overview of the  heart, heart disease, and how the Pritikin lifestyle positively impacts risk factors.  Exercise Biomechanics Clinical staff led group instruction and group discussion with PowerPoint presentation and patient guidebook. To enhance the learning environment the use of posters, models and videos may be added. Patients will learn how the structural parts of their bodies function and how these functions impact their daily activities, movement, and exercise. Patients will learn how to promote a neutral spine, learn how to manage pain, and identify ways to improve their physical movement in order to promote healthy living. The purpose of this lesson is to expose patients to common physical limitations that impact physical activity. Participants will learn practical ways to adapt and manage aches and pains, and to minimize their effect on regular exercise. Patients will learn how to maintain good posture while sitting, walking, and lifting.  Balance Training and Fall Prevention  Clinical staff led group instruction and group discussion with PowerPoint presentation and patient guidebook. To enhance the learning environment the use of posters, models and videos may be added. At the conclusion of this workshop, patients will understand the importance of their sensorimotor skills (vision, proprioception, and the vestibular system) in maintaining their ability to balance as they age. Patients will apply a variety of balancing exercises that are appropriate for their current level of function. Patients will understand the common causes for poor balance, possible solutions to these problems, and ways to modify their physical environment in order to minimize their fall risk. The purpose of this lesson is to teach patients about the importance of maintaining balance as they age and ways to minimize their risk of falling.  WORKSHOPS   Nutrition:  Fueling a Scientist, research (physical sciences) led group instruction and  group discussion with PowerPoint presentation and patient guidebook. To enhance the learning environment the use of posters, models and videos may be added. Patients will review the foundational principles of the [place] and understand what constitutes a serving size in each of the food groups. Patients will also learn Pritikin-friendly foods that are better choices when away from home and review make-ahead meal and snack options. Calorie density will be reviewed and applied to three nutrition priorities: weight maintenance, weight loss, and weight gain. The purpose of this lesson is to reinforce (in a group setting) the key concepts around what patients are recommended to eat and how to apply these guidelines when away from home by planning and selecting Pritikin-friendly options. Patients will understand how calorie density may be adjusted for different weight management goals.  Mindful Eating  Clinical staff led group instruction and group discussion with PowerPoint presentation and patient guidebook. To enhance the learning environment the use of posters, models and videos may be added. Patients will briefly review the concepts of  the [place] and the importance of low-calorie dense foods. The concept of mindful eating will be introduced as well as the importance of paying attention to internal hunger signals. Triggers for non-hunger eating and techniques for dealing with triggers will be explored. The purpose of this lesson is to provide patients with the opportunity to review the basic principles of the [place], discuss the value of eating mindfully and how to measure internal cues of hunger and fullness using the Hunger Scale. Patients will also discuss reasons for non-hunger eating and learn strategies to use for controlling emotional eating.  Targeting Your Nutrition Priorities Clinical staff led group instruction and group discussion with PowerPoint presentation  and patient guidebook. To enhance the learning environment the use of posters, models and videos may be added. Patients will learn how to determine their genetic susceptibility to disease by reviewing their family history. Patients will gain insight into the importance of diet as part of an overall healthy lifestyle in mitigating the impact of genetics and other environmental insults. The purpose of this lesson is to provide patients with the opportunity to assess their personal nutrition priorities by looking at their family history, their own health history and current risk factors. Patients will also be able to discuss ways of prioritizing and modifying the [place] for their highest risk areas  Menu  Clinical staff led group instruction and group discussion with PowerPoint presentation and patient guidebook. To enhance the learning environment the use of posters, models and videos may be added. Using menus brought in from [employer], or printed from [employer], patients will apply the [place] dining out guidelines that were presented in the [employer] video. Patients will also be able to practice these guidelines in a variety of provided scenarios. The purpose of this lesson is to provide patients with the opportunity to practice hands-on learning of the [place] with actual menus and practice scenarios.  Label Reading Clinical staff led group instruction and group discussion with PowerPoint presentation and patient guidebook. To enhance the learning environment the use of posters, models and videos may be added. Patients will review and discuss the Pritikin label reading guidelines presented in Pritikin's Label Reading Educational series video. Using fool labels brought in from local grocery stores and markets, patients will apply the label reading guidelines and determine if the packaged food meet the Pritikin guidelines. The purpose of  this lesson is to provide patients with the opportunity to review, discuss, and practice hands-on learning of the Pritikin Label Reading guidelines with actual packaged food labels. [place] Workshops are designed to teach patients ways to prepare quick, simple, and affordable recipes at home. The importance of nutrition's role in chronic disease risk reduction is reflected in its emphasis in the overall Pritikin program. By learning how to prepare essential core Pritikin Eating Plan recipes, patients will increase control over what they eat; be able to customize the flavor of foods without the use of added salt, sugar, or fat; and improve the quality of the food they consume. By learning a set of core recipes which are easily assembled, quickly prepared, and affordable, patients are more likely to prepare more healthy foods at home. These workshops focus on convenient breakfasts, simple entres, side dishes, and desserts which can be prepared with minimal effort and are consistent with nutrition recommendations for cardiovascular risk reduction. Cooking [employer] are taught by a Engineer, materials (  RD) who has been trained by the [employer] team. The chef or RD has a clear understanding of the importance of minimizing - if not completely eliminating - added fat, sugar, and sodium in recipes. Throughout the series of [place] Workshop sessions, patients will learn about healthy ingredients and efficient methods of cooking to build confidence in their capability to prepare    Cooking School weekly topics:  Adding Flavor- Sodium-Free  Fast and Healthy Breakfasts  Powerhouse Plant-Based Proteins  Satisfying Salads and Dressings  Simple Sides and Sauces  International Cuisine-Spotlight on the [place] Zones  Delicious Desserts  Savory Soups  Efficiency Cooking - Meals in a Snap  Tasty Appetizers and Snacks  Comforting Weekend  Breakfasts  One-Pot Wonders   Fast Evening Meals  Easy [place] (Psychosocial): New Thoughts, New Behaviors Clinical staff led group instruction and group discussion with PowerPoint presentation and patient guidebook. To enhance the learning environment the use of posters, models and videos may be added. Patients will learn and practice techniques for developing effective health and lifestyle goals. Patients will be able to effectively apply the goal setting process learned to develop at least one new personal goal.  The purpose of this lesson is to expose patients to a new skill set of behavior modification techniques such as techniques setting SMART goals, overcoming barriers, and achieving new thoughts and new behaviors.  Managing Moods and Relationships Clinical staff led group instruction and group discussion with PowerPoint presentation and patient guidebook. To enhance the learning environment the use of posters, models and videos may be added. Patients will learn how emotional and chronic stress factors can impact their health and relationships. They will learn healthy ways to manage their moods and utilize positive coping mechanisms. In addition, ICR patients will learn ways to improve communication skills. The purpose of this lesson is to expose patients to ways of understanding how one's mood and health are intimately connected. Developing a healthy outlook can help build positive relationships and connections with others. Patients will understand the importance of utilizing effective communication skills that include actively listening and being heard. They will learn and understand the importance of the "4 Cs" and especially Connections in fostering of a Healthy Mind-Set.  Healthy Sleep for a Healthy Heart Clinical staff led group instruction and group discussion with PowerPoint presentation and patient guidebook. To  enhance the learning environment the use of posters, models and videos may be added. At the conclusion of this workshop, patients will be able to demonstrate knowledge of the importance of sleep to overall health, well-being, and quality of life. They will understand the symptoms of, and treatments for, common sleep disorders. Patients will also be able to identify daytime and nighttime behaviors which impact sleep, and they will be able to apply these tools to help manage sleep-related challenges. The purpose of this lesson is to provide patients with a general overview of sleep and outline the importance of quality sleep. Patients will learn about a few of the most common sleep disorders. Patients will also be introduced to the concept of "sleep hygiene," and discover ways to self-manage certain sleeping problems through simple daily behavior changes. Finally, the workshop will motivate patients by clarifying the links between quality sleep and their goals of heart-healthy living.   Recognizing and Reducing Stress Clinical staff led group instruction and group discussion with PowerPoint presentation and patient guidebook. To enhance the learning environment the use  of posters, models and videos may be added. At the conclusion of this workshop, patients will be able to understand the types of stress reactions, differentiate between acute and chronic stress, and recognize the impact that chronic stress has on their health. They will also be able to apply different coping mechanisms, such as reframing negative self-talk. Patients will have the opportunity to practice a variety of stress management techniques, such as deep abdominal breathing, progressive muscle relaxation, and/or guided imagery.  The purpose of this lesson is to educate patients on the role of stress in their lives and to provide healthy techniques for coping with it.  Learning Barriers/Preferences:  Learning Barriers/Preferences - 07/27/22  1509       Learning Barriers/Preferences   Learning Barriers Sight   pt wears glasses   Learning Preferences Computer/Internet;Group Instruction;Individual Instruction;Skilled Demonstration;Verbal Instruction             Education Topics:  Knowledge Questionnaire Score:  Knowledge Questionnaire Score - 07/27/22 1017       Knowledge Questionnaire Score   Pre Score 20/24             Core Components/Risk Factors/Patient Goals at Admission:  Personal Goals and Risk Factors at Admission - 07/27/22 1510       Core Components/Risk Factors/Patient Goals on Admission    Weight Management Yes;Weight Maintenance    Intervention Weight Management: Develop a combined nutrition and exercise program designed to reach desired caloric intake, while maintaining appropriate intake of nutrient and fiber, sodium and fats, and appropriate energy expenditure required for the weight goal.;Weight Management: Provide education and appropriate resources to help participant work on and attain dietary goals.    Admit Weight 205 lb 14.6 oz (93.4 kg)    Diabetes Yes    Intervention Provide education about signs/symptoms and action to take for hypo/hyperglycemia.;Provide education about proper nutrition, including hydration, and aerobic/resistive exercise prescription along with prescribed medications to achieve blood glucose in normal ranges: Fasting glucose 65-99 mg/dL    Expected Outcomes Short Term: Participant verbalizes understanding of the signs/symptoms and immediate care of hyper/hypoglycemia, proper foot care and importance of medication, aerobic/resistive exercise and nutrition plan for blood glucose control.;Long Term: Attainment of HbA1C < 7%.    Hypertension Yes    Intervention Provide education on lifestyle modifcations including regular physical activity/exercise, weight management, moderate sodium restriction and increased consumption of fresh fruit, vegetables, and low fat dairy, alcohol  moderation, and smoking cessation.;Monitor prescription use compliance.    Expected Outcomes Short Term: Continued assessment and intervention until BP is < 140/34m HG in hypertensive participants. < 130/870mHG in hypertensive participants with diabetes, heart failure or chronic kidney disease.;Long Term: Maintenance of blood pressure at goal levels.    Lipids Yes    Intervention Provide education and support for participant on nutrition & aerobic/resistive exercise along with prescribed medications to achieve LDL '70mg'$ , HDL >'40mg'$ .    Expected Outcomes Short Term: Participant states understanding of desired cholesterol values and is compliant with medications prescribed. Participant is following exercise prescription and nutrition guidelines.;Long Term: Cholesterol controlled with medications as prescribed, with individualized exercise RX and with personalized nutrition plan. Value goals: LDL < '70mg'$ , HDL > 40 mg.    Stress Yes    Intervention Offer individual and/or small group education and counseling on adjustment to heart disease, stress management and health-related lifestyle change. Teach and support self-help strategies.;Refer participants experiencing significant psychosocial distress to appropriate mental health specialists for further evaluation and treatment. When possible, include  family members and significant others in education/counseling sessions.    Expected Outcomes Short Term: Participant demonstrates changes in health-related behavior, relaxation and other stress management skills, ability to obtain effective social support, and compliance with psychotropic medications if prescribed.;Long Term: Emotional wellbeing is indicated by absence of clinically significant psychosocial distress or social isolation.             Core Components/Risk Factors/Patient Goals Review:   Goals and Risk Factor Review     Row Name 07/31/22 1126             Core Components/Risk Factors/Patient  Goals Review   Personal Goals Review Weight Management/Obesity;Hypertension;Lipids;Diabetes       Review Tom started intensive cardiac rehab on 07/31/22 and did well with exercise. Vital signs and CBG's were stable.       Expected Outcomes Tom will continue to participate in intensive cardiac rehab for exercise,                Core Components/Risk Factors/Patient Goals at Discharge (Final Review):   Goals and Risk Factor Review - 07/31/22 1126       Core Components/Risk Factors/Patient Goals Review   Personal Goals Review Weight Management/Obesity;Hypertension;Lipids;Diabetes    Review Tom started intensive cardiac rehab on 07/31/22 and did well with exercise. Vital signs and CBG's were stable.    Expected Outcomes [name] will continue to participate in intensive cardiac rehab for exercise,             ITP Comments:  ITP Comments     Row Name 07/27/22 1037 07/31/22 1120         ITP Comments 30 day ITP Review. Introduction to [facility]/ Intensive Cardiac Rehab. Initial Orientation Packet Reviewed with the patient 30 Day ITP Review. Tom started intensve cardiac rehab on 07/31/22 and did well with exercise.               Comments: See ITP products.Harrell Gave RN BSN

## 2022-07-31 NOTE — Progress Notes (Signed)
Daily Session Note  Patient Details  Name: Garrett NEAL Sr. MRN: [id number] Date of Birth: November 17, 1940 Referring Provider:   Flowsheet Row INTENSIVE CARDIAC REHAB ORIENT from 07/27/2022 in [place]  Referring Provider Dr Candee Furbish, MD       Encounter Date: 07/31/2022  Check In:  Session Check In - 07/31/22 0834       Check-In   Supervising physician immediately available to respond to emergencies [facility] - Physician supervision    Physician(s) Dr. Sallyanne Kuster    Location [facility]    Staff Present Barnet Pall, RN, Rico Ala, MS, Exercise Physiologist;David Lilyan Punt, MS, ACSM-CEP, CCRP, Exercise Physiologist;Johnny Starleen Blue, MS, Exercise Physiologist;Jetta Gilford Rile BS, ACSM-CEP, Exercise Physiologist;Olinty Celesta Aver, MS, ACSM-CEP, Exercise Physiologist    Virtual Visit No    Medication changes reported     No    Fall or balance concerns reported    No    Tobacco Cessation No Change    Warm-up and Cool-down Performed as group-led instruction    Resistance Training Performed Yes    VAD Patient? No    PAD/SET Patient? No      Pain Assessment   Currently in Pain? No/denies    Pain Score 0-No pain    Multiple Pain Sites No             Capillary Blood Glucose: Results for orders placed or performed during the hospital encounter of 07/31/22 (from the past 24 hour(s))  Glucose, capillary     Status: Abnormal   Collection Time: 07/31/22  9:04 AM  Result Value Ref Range   Glucose-Capillary 213 (H) 70 - 99 mg/dL  Glucose, capillary     Status: Abnormal   Collection Time: 07/31/22  9:51 AM  Result Value Ref Range   Glucose-Capillary 214 (H) 70 - 99 mg/dL     Exercise Prescription Changes - 07/31/22 1100       Response to Exercise   Blood Pressure (Admit) 122/70    Blood Pressure (Exercise) 128/68    Blood Pressure (Exit) 120/60    Heart Rate (Admit) 71 bpm    Heart Rate (Exercise) 113 bpm    Heart Rate (Exit) 69  bpm    Rating of Perceived Exertion (Exercise) 12    Symptoms None    Comments Pt's first day in the cRP2 program    Duration Continue with 30 min of aerobic exercise without signs/symptoms of physical distress.    Intensity THRR unchanged      Progression   Progression Continue to progress workloads to maintain intensity without signs/symptoms of physical distress.    Average METs 2.45      Resistance Training   Training Prescription Yes    Weight 3    Reps 10-15    Time 10 Minutes      Interval Training   Interval Training No      Recumbant Bike   Level 2    Minutes 15    METs 2.2      NuStep   Level 2    SPM 102    Minutes 15    METs 2.7             Social History   Tobacco Use  Smoking Status Former   Types: Cigarettes   Quit date: 08/01/1970   Years since quitting: 52.0  Smokeless Tobacco Never    Goals Met:  Exercise tolerated well No report of concerns or symptoms today Strength training  completed today  Goals Unmet:  Not Applicable  Comments: Garrett Jordan started cardiac rehab today.  Pt tolerated light exercise without difficulty. VSS, telemetry-chronic atrial fibrillation, asymptomatic.  Medication list reconciled. Pt denies barriers to medicaiton compliance.  PSYCHOSOCIAL ASSESSMENT:  PHQ-0. Pt exhibits positive coping skills, hopeful outlook with supportive family. No psychosocial needs identified at this time, no psychosocial interventions necessary.    Pt enjoys yard work, gardening, spending time with his family and playing soccer.   Pt oriented to exercise equipment and routine.    Understanding verbalized. Harrell Gave RN BSN    Dr. Fransico Him is Medical Director for Cardiac Rehab at [facility].

## 2022-08-01 ENCOUNTER — Other Ambulatory Visit: Payer: Self-pay | Admitting: Cardiology

## 2022-08-01 DIAGNOSIS — I480 Paroxysmal atrial fibrillation: Secondary | ICD-10-CM

## 2022-08-02 ENCOUNTER — Encounter (HOSPITAL_COMMUNITY)
Admission: RE | Admit: 2022-08-02 | Discharge: 2022-08-02 | Disposition: A | Discharge status: Home / Self Care | Payer: Medicare Other | Source: Ambulatory Visit | Attending: Cardiology | Admitting: Cardiology

## 2022-08-02 DIAGNOSIS — I252 Old myocardial infarction: Secondary | ICD-10-CM | POA: Diagnosis not present

## 2022-08-02 DIAGNOSIS — Z4889 Encounter for other specified surgical aftercare: Secondary | ICD-10-CM | POA: Diagnosis not present

## 2022-08-02 DIAGNOSIS — I2129 ST elevation (STEMI) myocardial infarction involving other sites: Secondary | ICD-10-CM

## 2022-08-02 LAB — GLUCOSE, CAPILLARY
Glucose-Capillary: 267 mg/dL — ABNORMAL HIGH (ref 70–99)
Glucose-Capillary: 211 mg/dL — ABNORMAL HIGH (ref 70–99)

## 2022-08-02 NOTE — Telephone Encounter (Signed)
Prescription refill request for Eliquis received. Indication:Afib  Last office visit:06/30/22 Harriet Pho)  Scr: 1.41 (06/30/22)  Age: 81 Weight: 92.1kg  Appropriate dose and refill sent to requested pharmacy.

## 2022-08-04 ENCOUNTER — Encounter (HOSPITAL_COMMUNITY)
Admission: RE | Admit: 2022-08-04 | Discharge: 2022-08-04 | Disposition: A | Discharge status: Home / Self Care | Payer: Medicare Other | Source: Ambulatory Visit | Attending: Cardiology | Admitting: Cardiology

## 2022-08-04 DIAGNOSIS — I252 Old myocardial infarction: Secondary | ICD-10-CM | POA: Diagnosis not present

## 2022-08-04 DIAGNOSIS — I2129 ST elevation (STEMI) myocardial infarction involving other sites: Secondary | ICD-10-CM

## 2022-08-04 DIAGNOSIS — Z4889 Encounter for other specified surgical aftercare: Secondary | ICD-10-CM | POA: Diagnosis not present

## 2022-08-04 LAB — GLUCOSE, CAPILLARY
Glucose-Capillary: 218 mg/dL — ABNORMAL HIGH (ref 70–99)
Glucose-Capillary: 183 mg/dL — ABNORMAL HIGH (ref 70–99)

## 2022-08-05 ENCOUNTER — Other Ambulatory Visit: Payer: Self-pay | Admitting: Cardiology

## 2022-08-07 ENCOUNTER — Encounter (HOSPITAL_COMMUNITY)
Admission: RE | Admit: 2022-08-07 | Discharge: 2022-08-07 | Disposition: A | Discharge status: Home / Self Care | Payer: Medicare Other | Source: Ambulatory Visit | Attending: Cardiology | Admitting: Cardiology

## 2022-08-07 DIAGNOSIS — Z4889 Encounter for other specified surgical aftercare: Secondary | ICD-10-CM | POA: Diagnosis not present

## 2022-08-07 DIAGNOSIS — I252 Old myocardial infarction: Secondary | ICD-10-CM | POA: Diagnosis not present

## 2022-08-07 DIAGNOSIS — I2129 ST elevation (STEMI) myocardial infarction involving other sites: Secondary | ICD-10-CM

## 2022-08-09 ENCOUNTER — Encounter (HOSPITAL_COMMUNITY)
Admission: RE | Admit: 2022-08-09 | Discharge: 2022-08-09 | Disposition: A | Discharge status: Home / Self Care | Payer: Medicare Other | Source: Ambulatory Visit | Attending: Cardiology | Admitting: Cardiology

## 2022-08-09 ENCOUNTER — Other Ambulatory Visit: Payer: Self-pay

## 2022-08-09 ENCOUNTER — Emergency Department (HOSPITAL_COMMUNITY)
Admission: EM | Admit: 2022-08-09 | Discharge: 2022-08-09 | Disposition: A | Discharge status: Home / Self Care | Payer: Medicare Other | Attending: Emergency Medicine | Admitting: Emergency Medicine

## 2022-08-09 ENCOUNTER — Emergency Department (HOSPITAL_COMMUNITY): Payer: Medicare Other

## 2022-08-09 DIAGNOSIS — I2129 ST elevation (STEMI) myocardial infarction involving other sites: Secondary | ICD-10-CM

## 2022-08-09 DIAGNOSIS — Z7982 Long term (current) use of aspirin: Secondary | ICD-10-CM | POA: Diagnosis not present

## 2022-08-09 DIAGNOSIS — I10 Essential (primary) hypertension: Secondary | ICD-10-CM | POA: Diagnosis not present

## 2022-08-09 DIAGNOSIS — Z951 Presence of aortocoronary bypass graft: Secondary | ICD-10-CM | POA: Insufficient documentation

## 2022-08-09 DIAGNOSIS — R079 Chest pain, unspecified: Secondary | ICD-10-CM | POA: Diagnosis not present

## 2022-08-09 DIAGNOSIS — R0789 Other chest pain: Secondary | ICD-10-CM | POA: Diagnosis not present

## 2022-08-09 DIAGNOSIS — M546 Pain in thoracic spine: Secondary | ICD-10-CM | POA: Insufficient documentation

## 2022-08-09 DIAGNOSIS — Z7901 Long term (current) use of anticoagulants: Secondary | ICD-10-CM | POA: Diagnosis not present

## 2022-08-09 DIAGNOSIS — I4891 Unspecified atrial fibrillation: Secondary | ICD-10-CM | POA: Insufficient documentation

## 2022-08-09 DIAGNOSIS — I2510 Atherosclerotic heart disease of native coronary artery without angina pectoris: Secondary | ICD-10-CM | POA: Diagnosis not present

## 2022-08-09 DIAGNOSIS — Z79899 Other long term (current) drug therapy: Secondary | ICD-10-CM | POA: Insufficient documentation

## 2022-08-09 LAB — BASIC METABOLIC PANEL
Anion gap: 12 (ref 5–15)
BUN: 27 mg/dL — ABNORMAL HIGH (ref 8–23)
CO2: 24 mmol/L (ref 22–32)
Calcium: 10.7 mg/dL — ABNORMAL HIGH (ref 8.9–10.3)
Chloride: 102 mmol/L (ref 98–111)
Creatinine, Ser: 1.95 mg/dL — ABNORMAL HIGH (ref 0.61–1.24)
GFR, Estimated: 34 mL/min — ABNORMAL LOW (ref >60)
Glucose, Bld: 155 mg/dL — ABNORMAL HIGH (ref 70–99)
Potassium: 4.3 mmol/L (ref 3.5–5.1)
Sodium: 138 mmol/L (ref 135–145)

## 2022-08-09 LAB — CBC
HCT: 43 % (ref 39.0–52.0)
Hemoglobin: 15.2 g/dL (ref 13.0–17.0)
MCH: 31.1 pg (ref 26.0–34.0)
MCHC: 35.3 g/dL (ref 30.0–36.0)
MCV: 88.1 fL (ref 80.0–100.0)
Platelets: 174 10*3/uL (ref 150–400)
RBC: 4.88 MIL/uL (ref 4.22–5.81)
RDW: 13.1 % (ref 11.5–15.5)
WBC: 5.6 10*3/uL (ref 4.0–10.5)
nRBC: 0 % (ref 0.0–0.2)

## 2022-08-09 LAB — TROPONIN I (HIGH SENSITIVITY)
Troponin I (High Sensitivity): 9 ng/L (ref <18)
Troponin I (High Sensitivity): 9 ng/L (ref <18)

## 2022-08-09 NOTE — ED Provider Notes (Signed)
[facility] EMERGENCY DEPARTMENT Provider Note   CSN: [id number] Arrival date & time: 08/09/22  1415     History  Chief Complaint  Patient presents with   Back Pain    Jeremi Losito. is a 81 y.o. male.  Patient here with left upper back pain, pain underneath his left scapula intermittently last 2 weeks.  Recent heart attack.  History of CAD, CABG, hypertension, high cholesterol, AAA, atrial fibrillation on Eliquis.  He is pain-free now at this time.  He has been having some pain underneath the left scapula at times.  Seems to be worse with movement.  He states that it does not like the chest pain he had with his heart attack about a month and a half ago.  He is not having any shortness of breath or abdominal pain and overall is asymptomatic now.  Thought maybe nitroglycerin helped but he is not sure.  Denies any fevers or chills.  No weakness or numbness or vision changes.  The history is provided by the patient.       Home Medications Prior to Admission medications   Medication Sig Start Date End Date Taking? Authorizing Provider  ACCU-CHEK GUIDE test strip USE TWICE DAILY 05/29/22   Shamleffer, Melanie Crazier, MD  acetaminophen (TYLENOL) 650 MG CR tablet Take 650 mg by mouth every 8 (eight) hours as needed for pain.    [provider]  allopurinol (ZYLOPRIM) 300 MG tablet Take 300 mg by mouth every other day. Take in the evening - on even days of the month    [provider]  aspirin 81 MG chewable tablet Chew 1 tablet (81 mg total) by mouth daily. Take for 30 days and then stop. Patient not taking: Reported on 07/27/2022 06/18/22   Barrett, Evelene Croon, PA-C  atorvastatin (LIPITOR) 40 MG tablet Take 1 tablet (40 mg total) by mouth daily. 06/18/22   Barrett, Evelene Croon, PA-C  Cholecalciferol (VITAMIN D3) 2000 UNITS TABS Take 2,000 Units by mouth daily.     [provider]  clonazePAM (KLONOPIN) 1 MG tablet Take 0.5 mg by mouth at bedtime.     [provider]  clopidogrel (PLAVIX) 75 MG tablet TAKE 1 TABLET BY MOUTH  DAILY Patient taking differently: Take 75 mg by mouth daily. 08/08/21   Jerline Pain, MD  Coenzyme Q10 300 MG CAPS Take 300 mg by mouth daily.    [provider]  Cyanocobalamin (VITAMIN B-12 PO) Take 2,500 mcg by mouth daily.    [provider]  ELIQUIS 5 MG TABS tablet TAKE 1 TABLET BY MOUTH TWICE A DAY 08/02/22   Jerline Pain, MD  ezetimibe (ZETIA) 10 MG tablet TAKE 1 TABLET BY MOUTH  DAILY 06/12/22   Early Osmond, MD  fenofibrate micronized (LOFIBRA) 200 MG capsule Take 1 capsule (200 mg total) by mouth daily before breakfast. 03/30/22   Shamleffer, Melanie Crazier, MD  Ferrous Sulfate Dried (HIGH POTENCY IRON) 65 MG TABS Take 65 mg by mouth daily.    [provider]  fluorouracil (EFUDEX) 5 % cream Apply 1 Application topically daily as needed (cancer spots). Use as directed    [provider]  fluticasone (FLONASE) 50 MCG/ACT nasal spray Place 2 sprays into both nostrils daily as needed for allergies.    [provider]  glipiZIDE (GLUCOTROL) 5 MG tablet Take 0.5 tablets (2.5 mg total) by mouth daily before breakfast AND 1 tablet (5 mg total) daily before supper.  03/30/22   Shamleffer, Melanie Crazier, MD  levothyroxine (SYNTHROID, LEVOTHROID) 175 MCG tablet Take 175 mcg by mouth daily before breakfast.    [provider]  lidocaine (ASPERCREME LIDOCAINE) 4 % Place 1 patch onto the skin daily as needed (pain).    [provider]  loratadine (CLARITIN) 10 MG tablet Take 10 mg by mouth at bedtime.    [provider]  magnesium oxide (MAG-OX) 400 (240 Mg) MG tablet Take 400 mg by mouth daily.    [provider]  metoprolol tartrate (LOPRESSOR) 25 MG tablet TAKE 1 TABLET BY MOUTH  TWICE DAILY Patient taking differently: Take 25 mg by mouth 2 (two) times daily. 03/07/22   Jerline Pain, MD  Multiple Vitamins-Minerals (PRESERVISION  AREDS 2) CAPS Take 1 capsule by mouth 2 (two) times daily.    [provider]  nitroGLYCERIN (NITROSTAT) 0.4 MG SL tablet Place 1 tablet (0.4 mg total) under the tongue every 5 (five) minutes as needed for chest pain. 06/17/22   Barrett, Evelene Croon, PA-C  olmesartan-hydrochlorothiazide (BENICAR HCT) 20-12.5 MG tablet Take 1 tablet by mouth daily. 06/30/22   Elgie Collard, PA-C  pantoprazole (PROTONIX) 40 MG tablet TAKE 1 TABLET BY MOUTH DAILY 08/07/22   Jerline Pain, MD  Polyethyl Glycol-Propyl Glycol (SYSTANE OP) Place 1 drop into both eyes daily as needed (dry/irritated eyes).    [provider]  Tamsulosin HCl (FLOMAX) 0.4 MG CAPS Take 0.4 mg by mouth every other day. Take in the evening - on even days of the month    [provider]  traMADol (ULTRAM) 50 MG tablet Take 50 mg by mouth as needed. 04/05/22   [provider]  triamcinolone cream (KENALOG) 0.1 % Apply 1 application  topically daily as needed (rash). 02/07/18   [provider]  TURMERIC PO Take 2 tablets by mouth in the morning and at bedtime.    [provider]  VASCEPA 1 g capsule TAKE 2 CAPSULES BY MOUTH 2 TIMES DAILY. 04/13/22   Jerline Pain, MD      Allergies    Ace inhibitors, Crestor [rosuvastatin], Nitroglycerin, and Quinolones    Review of Systems   Review of Systems  Physical Exam Updated Vital Signs BP 139/83   Pulse 78   Temp 98.2 F (36.8 C) (Oral)   Resp 12   SpO2 98%  Physical Exam Vitals and nursing note reviewed.  Constitutional:      General: He is not in acute distress.    Appearance: He is well-developed. He is not ill-appearing.  HENT:     Head: Normocephalic and atraumatic.     Nose: Nose normal.     Mouth/Throat:     Mouth: Mucous membranes are moist.  Eyes:     Extraocular Movements: Extraocular movements intact.     Conjunctiva/sclera: Conjunctivae normal.     Pupils: Pupils are equal, round, and reactive to light.  Cardiovascular:      Rate and Rhythm: Normal rate and regular rhythm.     Pulses: Normal pulses.     Heart sounds: Normal heart sounds. No murmur heard. Pulmonary:     Effort: Pulmonary effort is normal. No respiratory distress.     Breath sounds: Normal breath sounds.  Abdominal:     Palpations: Abdomen is soft.     Tenderness: There is no abdominal tenderness.  Musculoskeletal:        General: Tenderness present. No swelling. Normal range of motion.  Cervical back: Normal range of motion and neck supple. No tenderness.     Comments: Tenderness in the left subscapularis area  Skin:    General: Skin is warm and dry.     Capillary Refill: Capillary refill takes less than 2 seconds.  Neurological:     General: No focal deficit present.     Mental Status: He is alert and oriented to person, place, and time.     Cranial Nerves: No cranial nerve deficit.     Sensory: No sensory deficit.     Motor: No weakness.     Coordination: Coordination normal.     Comments: 5+ out of 5 strength throughout, normal sensation, no drift  Psychiatric:        Mood and Affect: Mood normal.     ED Results / Procedures / Treatments   Labs (all labs ordered are listed, but only abnormal results are displayed) Labs Reviewed  BASIC METABOLIC PANEL - Abnormal; Notable for the following components:      Result Value   Glucose, Bld 155 (*)    BUN 27 (*)    Creatinine, Ser 1.95 (*)    Calcium 10.7 (*)    GFR, Estimated 34 (*)    All other components within normal limits  CBC  TROPONIN I (HIGH SENSITIVITY)  TROPONIN I (HIGH SENSITIVITY)    EKG EKG Interpretation  Date/Time:  Wednesday August 09 2022 15:01:20 EDT Ventricular Rate:  77 PR Interval:    QRS Duration: 152 QT Interval:  420 QTC Calculation: 475 R Axis:   104 Text Interpretation: Atrial fibrillation Right bundle branch block Abnormal ECG When compared with ECG of 16-Jun-2022 06:55, PREVIOUS ECG IS PRESENT Confirmed by Lennice Sites [phone]) on  08/09/2022 9:42:45 PM  Radiology DG Chest 1 View  Result Date: 08/09/2022 CLINICAL DATA:  Back pain EXAM: CHEST  1 VIEW COMPARISON:  06/15/2022 FINDINGS: Post sternotomy changes. No acute airspace disease or effusion. Normal cardiomediastinal silhouette with aortic atherosclerosis. No pneumothorax IMPRESSION: No active disease. Electronically Signed   By: Donavan Foil M.D.   On: 08/09/2022 16:04    Procedures Procedures    Medications Ordered in ED Medications - No data to display  ED Course/ Medical Decision Making/ A&P                           Medical Decision Making  SAIQUAN HANDS Sr. is here with left upper back pain.  Normal vitals.  No fever.  History of CAD, hypertension, high cholesterol, atrial fibrillation on Eliquis.  Differential diagnosis is likely musculoskeletal process, less likely ACS.  Have no concern for PE or dissection.  He is very well-appearing.  He is asymptomatic currently.  He has been having some upper back discomfort on and off for the last 2 weeks since doing cardiac rehab and new exercises.  He appears to be mostly tender to his left subscapularis area.  He is got good pulses throughout.  Normal neurological exam.  His EKG shows atrial fibrillation but no ischemic changes.  Unchanged from prior EKG.  Does not appear to have any new ischemic changes.  We will get CBC, BMP, troponin, chest x-ray and reevaluate.  Per my review and interpretation of labs is no significant anemia, electrolyte abnormality, kidney injury.  Initial troponin within normal limits at 9.  Chest x-ray per my review and interpretation shows no evidence of pneumonia pneumothorax.  Overall patient still without any discomfort.  He is willing to stay for second troponin which I think is reasonable given his recent history.  Repeat troponin is 9.  Overall patient chest pain free.  I do not think that this is cardiac.  He understands return precautions.  Discharged in good condition.  Suspect  musculoskeletal process.  This chart was dictated using voice recognition software.  Despite best efforts to proofread,  errors can occur which can change the documentation meaning.         Final Clinical Impression(s) / ED Diagnoses Final diagnoses:  Acute left-sided thoracic back pain    Rx / DC Orders ED Discharge Orders     None         Lennice Sites, DO 08/09/22 2323

## 2022-08-09 NOTE — ED Triage Notes (Signed)
Patient here with complaint of back pain that started two weeks ago but got worse during after cardiac rehab. Patient took 2x SL NTG which improved pain both times.

## 2022-08-09 NOTE — ED Provider Triage Note (Signed)
Emergency Medicine Provider Triage Evaluation Note  Garrett Jordan Sr. , a 81 y.o. male  was evaluated in triage.  Pt complains of left upper back pain below the left shoulder blade.  He has had intermittent pain on the side for the past 2 weeks.  He had worsening pain today after cardiac rehab.  He had an MI about 2 weeks ago.  He states that he took nitroglycerin about an hour apart and each time it relieve the pain.  The pain came back and has been the worst it has been in a long time.  Nothing seems to make it worse or better he came in for further evaluation.  Review of Systems  Positive: Upper back pain Negative: Neuro symptoms  Physical Exam  BP (!) 133/91 (BP Location: Left Arm)   Pulse 84   Temp 98.2 F (36.8 C) (Oral)   Resp 18   SpO2 97%  Gen:   Awake, no distress   Resp:  Normal effort  MSK:   Moves extremities without difficulty  Other:  No reproducible pain in the upper back with palpation  Medical Decision Making  Medically screening exam initiated at 3:16 PM.  Appropriate orders placed.  Garrett Meek Sr. was informed that the remainder of the evaluation will be completed by another provider, this initial triage assessment does not replace that evaluation, and the importance of remaining in the ED until their evaluation is complete.  Work-up initiated   Garrett Mail, PA-C 08/09/22 1517

## 2022-08-11 ENCOUNTER — Encounter (HOSPITAL_COMMUNITY)
Admission: RE | Admit: 2022-08-11 | Discharge: 2022-08-11 | Disposition: A | Discharge status: Home / Self Care | Payer: Medicare Other | Source: Ambulatory Visit | Attending: Cardiology | Admitting: Cardiology

## 2022-08-11 DIAGNOSIS — I2129 ST elevation (STEMI) myocardial infarction involving other sites: Secondary | ICD-10-CM

## 2022-08-11 DIAGNOSIS — I252 Old myocardial infarction: Secondary | ICD-10-CM | POA: Diagnosis not present

## 2022-08-11 DIAGNOSIS — Z951 Presence of aortocoronary bypass graft: Secondary | ICD-10-CM

## 2022-08-11 DIAGNOSIS — I480 Paroxysmal atrial fibrillation: Secondary | ICD-10-CM

## 2022-08-11 DIAGNOSIS — I714 Abdominal aortic aneurysm, without rupture, unspecified: Secondary | ICD-10-CM

## 2022-08-11 DIAGNOSIS — E118 Type 2 diabetes mellitus with unspecified complications: Secondary | ICD-10-CM

## 2022-08-11 DIAGNOSIS — N183 Chronic kidney disease, stage 3 unspecified: Secondary | ICD-10-CM

## 2022-08-11 DIAGNOSIS — E1159 Type 2 diabetes mellitus with other circulatory complications: Secondary | ICD-10-CM

## 2022-08-11 DIAGNOSIS — Z4889 Encounter for other specified surgical aftercare: Secondary | ICD-10-CM | POA: Diagnosis not present

## 2022-08-11 DIAGNOSIS — E785 Hyperlipidemia, unspecified: Secondary | ICD-10-CM

## 2022-08-11 LAB — HEPATIC FUNCTION PANEL
ALT: 27 IU/L (ref 0–44)
AST: 22 IU/L (ref 0–40)
Albumin: 4.4 g/dL (ref 3.8–4.8)
Alkaline Phosphatase: 98 IU/L (ref 44–121)
Bilirubin Total: 0.7 mg/dL (ref 0.0–1.2)
Bilirubin, Direct: 0.27 mg/dL (ref 0.00–0.40)
Total Protein: 6.9 g/dL (ref 6.0–8.5)

## 2022-08-11 LAB — LIPID PANEL
Chol/HDL Ratio: 3.1 ratio (ref 0.0–5.0)
Cholesterol, Total: 99 mg/dL — ABNORMAL LOW (ref 100–199)
HDL: 32 mg/dL — ABNORMAL LOW
LDL Chol Calc (NIH): 36 mg/dL (ref 0–99)
Triglycerides: 192 mg/dL — ABNORMAL HIGH (ref 0–149)
VLDL Cholesterol Cal: 31 mg/dL (ref 5–40)

## 2022-08-11 NOTE — Progress Notes (Signed)
Garrett Jordan returned to exercise today per Dr Marlou Porch as Garrett Jordan went to the ED on 08/09/22 with back pain.Will continue to monitor the patient throughout  the program. Garrett Pall, RN,BSN 08/11/2022 9:10 AM

## 2022-08-14 ENCOUNTER — Encounter (HOSPITAL_COMMUNITY)
Admission: RE | Admit: 2022-08-14 | Discharge: 2022-08-14 | Disposition: A | Discharge status: Home / Self Care | Payer: Medicare Other | Source: Ambulatory Visit | Attending: Cardiology | Admitting: Cardiology

## 2022-08-14 DIAGNOSIS — I252 Old myocardial infarction: Secondary | ICD-10-CM | POA: Insufficient documentation

## 2022-08-14 DIAGNOSIS — Z5189 Encounter for other specified aftercare: Secondary | ICD-10-CM | POA: Diagnosis not present

## 2022-08-14 DIAGNOSIS — I2129 ST elevation (STEMI) myocardial infarction involving other sites: Secondary | ICD-10-CM

## 2022-08-14 DIAGNOSIS — Z9862 Peripheral vascular angioplasty status: Secondary | ICD-10-CM | POA: Diagnosis not present

## 2022-08-15 ENCOUNTER — Encounter: Payer: Self-pay | Admitting: Cardiology

## 2022-08-15 ENCOUNTER — Ambulatory Visit: Payer: Medicare Other | Attending: Cardiology | Admitting: Cardiology

## 2022-08-15 VITALS — BP 140/80 | HR 72 | Ht 69.5 in | Wt 200.0 lb

## 2022-08-15 DIAGNOSIS — I714 Abdominal aortic aneurysm, without rupture, unspecified: Secondary | ICD-10-CM

## 2022-08-15 DIAGNOSIS — I7121 Aneurysm of the ascending aorta, without rupture: Secondary | ICD-10-CM

## 2022-08-15 DIAGNOSIS — I9789 Other postprocedural complications and disorders of the circulatory system, not elsewhere classified: Secondary | ICD-10-CM | POA: Diagnosis not present

## 2022-08-15 NOTE — Patient Instructions (Addendum)
Medication Instructions:  The current medical regimen is effective;  continue present plan and medications.  *If you need a refill on your cardiac medications before your next appointment, please call your pharmacy*   Testing/Procedures: CT of Chest, abdomen and pelvis -  CT scanning, (CAT scanning), is a noninvasive, special x-ray that produces cross-sectional images of the body using x-rays and a computer. CT scans help physicians diagnose and treat medical conditions. For some CT exams, a contrast material is used to enhance visibility in the area of the body being studied. CT scans provide greater clarity and reveal more details than regular x-ray exams.  Follow-Up: At [facility], you and your health needs are our priority.  As part of our continuing mission to provide you with exceptional heart care, we have created designated Provider Care Teams.  These Care Teams include your primary Cardiologist (physician) and Advanced Practice Providers (APPs -  Physician Assistants and Nurse Practitioners) who all work together to provide you with the care you need, when you need it.  We recommend signing up for the patient portal called "MyChart".  Sign up information is provided on this After Visit Summary.  MyChart is used to connect with patients for Virtual Visits (Telemedicine).  Patients are able to view lab/test results, encounter notes, upcoming appointments, etc.  Non-urgent messages can be sent to your provider as well.   To learn more about what you can do with MyChart, go to [email or web address].    Your next appointment:   6 month(s)  The format for your next appointment:   In Person  Provider:   Dr Marlou Porch    Important Information About Sugar

## 2022-08-15 NOTE — Progress Notes (Signed)
Cardiology Office Note:    Date:  08/15/2022   ID:  Garrett Jordan Sr., DOB 1941-01-30, MRN [id number]  PCP:  Antony Contras, MD   [facility] Providers Cardiologist:  Candee Furbish, MD     Referring MD: Antony Contras, MD    History of Present Illness:    Garrett Jordan. is a 81 y.o. male here for follow-up STEMI on 06/15/2022 while holding his Plavix and Eliquis in preparation for back surgery secondary to spinal stenosis.  Cardiac catheterization showed patent LIMA to LAD and acute occlusion of SVG to left posterior lateral branch which was small.  PCI was attempted but unsuccessful.  Chronic occlusion of SVG to PLA noted.  He also had total occlusion of the native circumflex beyond the first OM branch and a subtotal occlusion of the mid RCA.    Ejection fraction reassuring at 55 to 60%.    Ascending aortic aneurysm at 44 mm followed by Dr. Cyndia Bent.    CAD prior CABG 2010 abdominal aortic aneurysm stent graft Dr. 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  Patient was last seen by me on 01/23/22 and has since had a left heart cath in August 2023. They had an unsuccessful PCI at that time. He was then seen by PA Harriet Pho on 06/30/22 and was started on Benicar 20-12.'5mg'$  daily. He has 1 month remaining on Plavix before we can discontinue this for repeat attempt at back surgery.  Patient states that he has been on the combination of Plavix and Eliquis prior to the heart cath. He denies any bleeding issues on this regimen.  Today, patient reports good blood pressure control per log from cardiac rehab.  He is compliant with and tolerating his medications well. He wonders if he should resume [place]. He states that he has follow up scheduled with nephrology soon and may discuss this at that time as well.  He plans to have back surgery if he is cleared medically. He sees Dr. Sherley Bounds, neurosurgery and  attends physical therapy for his back. He reminds [name] that he was holding his anticoagulants for surgery prep when his heart attack occurred.   Past Medical History:  Diagnosis Date   AAA (abdominal aortic aneurysm) (HCC)    Arthritis    knees   Atrial fibrillation (HCC)    CAD (coronary artery disease)    Cancer (HCC)    prostate   CKD (chronic kidney disease), stage III ([place]) 06/18/2022   COVID    06/2019 and in 2022 states they were mild cases   Diabetes mellitus without complication ([place])    Dysrhythmia    A-fib pt is on Eliquis   GERD (gastroesophageal reflux disease)    Gout    History of kidney stones    Hyperlipidemia    Hypertension    Hypothyroidism    Myocardial infarction ([place]) 01/1991   sp inferior   Nerve compression    right leg   Reflux    Seasonal allergies    Thoracic ascending aortic aneurysm ([place])    Thyroid disease    hypothyroidism    Past Surgical History:  Procedure Laterality Date   ABDOMINAL AORTAGRAM N/A 02/28/2012   Procedure: ABDOMINAL Maxcine Ham;  Surgeon: Serafina Mitchell, MD;  Location: [facility] CATH LAB;  Service: Cardiovascular;  Laterality: N/A;   abdominal aortagram embolization  02/28/2012   ABDOMINAL AORTIC ANEURYSM REPAIR  07/14/2012   CARDIAC  CATHETERIZATION     CORONARY ARTERY BYPASS GRAFT  04/13/2009   CORONARY BALLOON ANGIOPLASTY N/A 06/15/2022   Procedure: CORONARY BALLOON ANGIOPLASTY;  Surgeon: Sherren Mocha, MD;  Location: [place] CV LAB;  Service: Cardiovascular;  Laterality: N/A;   CORONARY STENT PLACEMENT  1992 and  2009   RCA   EMBOLIZATION Right 02/28/2012   Procedure: EMBOLIZATION;  Surgeon: Serafina Mitchell, MD;  Location: [facility] CATH LAB;  Service: Cardiovascular;  Laterality: Right;   IR GENERIC HISTORICAL  12/05/2016   IR RADIOLOGIST EVAL & MGMT 12/05/2016 Jacqulynn Cadet, MD [facility] INTERV RAD   IR RADIOLOGIST EVAL & MGMT  07/10/2019   IR RADIOLOGIST EVAL & MGMT  08/25/2020   IR RADIOLOGIST EVAL & MGMT  08/16/2021    KIDNEY STONE SURGERY     LEFT HEART CATH AND CORONARY ANGIOGRAPHY N/A 06/15/2022   Procedure: LEFT HEART CATH AND CORONARY ANGIOGRAPHY;  Surgeon: Sherren Mocha, MD;  Location: [place] CV LAB;  Service: Cardiovascular;  Laterality: N/A;   LEFT HEART CATHETERIZATION WITH CORONARY/GRAFT ANGIOGRAM N/A 01/08/2015   Procedure: LEFT HEART CATHETERIZATION WITH Beatrix Fetters;  Surgeon: Jacolyn Reedy, MD;  Location: [facility] CATH LAB;  Service: Cardiovascular;  Laterality: N/A;   parathyroid adenoma     PROSTATE SURGERY     rad seeds   SPINE SURGERY     TONSILLECTOMY      Current Medications: Current Meds  Medication Sig   ACCU-CHEK GUIDE test strip USE TWICE DAILY   acetaminophen (TYLENOL) 650 MG CR tablet Take 650 mg by mouth every 8 (eight) hours as needed for pain.   allopurinol (ZYLOPRIM) 300 MG tablet Take 300 mg by mouth every other day. Take in the evening - on even days of the month   atorvastatin (LIPITOR) 40 MG tablet Take 1 tablet (40 mg total) by mouth daily.   Cholecalciferol (VITAMIN D3) 2000 UNITS TABS Take 2,000 Units by mouth daily.    clonazePAM (KLONOPIN) 1 MG tablet Take 0.5 mg by mouth at bedtime.   clopidogrel (PLAVIX) 75 MG tablet TAKE 1 TABLET BY MOUTH  DAILY   Coenzyme Q10 300 MG CAPS Take 300 mg by mouth daily.   Cyanocobalamin (VITAMIN B-12 PO) Take 2,500 mcg by mouth daily.   ELIQUIS 5 MG TABS tablet TAKE 1 TABLET BY MOUTH TWICE A DAY   ezetimibe (ZETIA) 10 MG tablet TAKE 1 TABLET BY MOUTH  DAILY   fenofibrate micronized (LOFIBRA) 200 MG capsule Take 1 capsule (200 mg total) by mouth daily before breakfast.   Ferrous Sulfate Dried (HIGH POTENCY IRON) 65 MG TABS Take 65 mg by mouth daily.   fluorouracil (EFUDEX) 5 % cream Apply 1 Application topically daily as needed (cancer spots). Use as directed   fluticasone (FLONASE) 50 MCG/ACT nasal spray Place 2 sprays into both nostrils daily as needed for allergies.   glipiZIDE (GLUCOTROL) 5 MG tablet Take 0.5  tablets (2.5 mg total) by mouth daily before breakfast AND 1 tablet (5 mg total) daily before supper.   levothyroxine (SYNTHROID, LEVOTHROID) 175 MCG tablet Take 175 mcg by mouth daily before breakfast.   lidocaine (ASPERCREME LIDOCAINE) 4 % Place 1 patch onto the skin daily as needed (pain).   loratadine (CLARITIN) 10 MG tablet Take 10 mg by mouth at bedtime.   magnesium oxide (MAG-OX) 400 (240 Mg) MG tablet Take 400 mg by mouth daily.   metoprolol tartrate (LOPRESSOR) 25 MG tablet TAKE 1 TABLET BY MOUTH  TWICE DAILY   Multiple Vitamins-Minerals (PRESERVISION  AREDS 2) CAPS Take 1 capsule by mouth 2 (two) times daily.   nitroGLYCERIN (NITROSTAT) 0.4 MG SL tablet Place 1 tablet (0.4 mg total) under the tongue every 5 (five) minutes as needed for chest pain.   olmesartan-hydrochlorothiazide (BENICAR HCT) 20-12.5 MG tablet Take 1 tablet by mouth daily.   pantoprazole (PROTONIX) 40 MG tablet TAKE 1 TABLET BY MOUTH DAILY   Polyethyl Glycol-Propyl Glycol (SYSTANE OP) Place 1 drop into both eyes daily as needed (dry/irritated eyes).   Tamsulosin HCl (FLOMAX) 0.4 MG CAPS Take 0.4 mg by mouth every other day. Take in the evening - on even days of the month   traMADol (ULTRAM) 50 MG tablet Take 50 mg by mouth as needed.   triamcinolone cream (KENALOG) 0.1 % Apply 1 application  topically daily as needed (rash).   TURMERIC PO Take 2 tablets by mouth in the morning and at bedtime.   VASCEPA 1 g capsule TAKE 2 CAPSULES BY MOUTH 2 TIMES DAILY.     Allergies:   Ace inhibitors, Crestor [rosuvastatin], Nitroglycerin, and Quinolones   Social History   Socioeconomic History   Marital status: Divorced    Spouse name: Not on file   Number of children: Not on file   Years of education: 14   Highest education level: Associate degree: academic program  Occupational History   Occupation: Retired  Tobacco Use   Smoking status: Former    Types: Cigarettes    Quit date: 08/01/1970    Years since quitting: 52.0    Smokeless tobacco: Never  Vaping Use   Vaping Use: Never used  Substance and Sexual Activity   Alcohol use: Yes    Alcohol/week: 6.0 standard drinks of alcohol    Types: 3 Glasses of wine, 3 Cans of beer per week    Comment: 1-3 glasses 3-4 times a week   Drug use: No   Sexual activity: Not Currently  Other Topics Concern   Not on file  Social History Narrative   Not on file   Social Determinants of Health   Financial Resource Strain: Not on file  Food Insecurity: Not on file  Transportation Needs: Not on file  Physical Activity: Not on file  Stress: Not on file  Social Connections: Not on file     Family History: The patient's family history includes Deep vein thrombosis in his mother; Diabetes in his maternal grandmother, sister, and sister; Heart attack in his father and sister; Heart disease in his father, paternal uncle, and sister; Hyperlipidemia in his father and sister; Hypertension in his father and sister; Stroke in his mother.  ROS:   Please see the history of present illness.     All other systems reviewed and are negative.  EKGs/Labs/Other Studies Reviewed:    The following studies were reviewed today:  Cardiac catheterization 2016 - Occluded SVG to PDA.  Other grafts patent.  Medical management.  CT of chest 02/2019 2-4.1 cm ascending thoracic aorta  Nuclear stress test 2021-low risk study basal inferior mid inferior defect.  EF 50%  ZIO monitor 2021-sinus pause 3.1 to 3.8 seconds.  This is during daytime hours.  We stopped his metoprolol because of the pauses.  EP saw him as well.  He is now back on low-dose metoprolol  NM Stress Test 05/31/2022   The study is normal. The study is low risk.   No ST deviation was noted.   Left ventricular function is normal. Nuclear stress EF: 61 %. The left ventricular ejection  fraction is normal (55-65%). End diastolic cavity size is normal.   Prior study available for comparison from 03/10/2020.  Cardiac  Catheterization 06/15/2022 1.  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  Status post CABG with continued patency of the LIMA to LAD, saphenous vein graft to acute marginal, and saphenous vein graft to PDA with extensive diffuse degenerative changes. 3.  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  Acute occlusion of the saphenous vein graft to left posterolateral branch with extensive degenerative changes and unsuccessful PCI due to inability to restore TIMI-3 flow in an extensively degenerated graft 5.  Normal LVEDP   Echo 06/15/2022  1. Left ventricular ejection fraction, by estimation, is 55 to 60%. The  left ventricle has normal function. The left ventricle demonstrates global  hypokinesis. There is mild concentric left ventricular hypertrophy. Left  ventricular diastolic parameters  are indeterminate.   2. Right ventricular systolic function is mildly reduced. The right  ventricular size is normal.   3. Left atrial size was moderately dilated.   4. Right atrial size was moderately dilated.   5. The mitral valve is normal in structure. No evidence of mitral valve  regurgitation. No evidence of mitral stenosis.   6. Tricuspid valve regurgitation is moderate.   7. The aortic valve is normal in structure. Aortic valve regurgitation is  not visualized. No aortic stenosis is present.   8. Aneurysm of the ascending aorta, measuring 44 mm. There is mild  dilatation of the aortic root, measuring 39 mm.   9. The inferior vena cava is normal in size with greater than 50%  respiratory variability, suggesting right atrial pressure of 3 mmHg.   EKG: As above  Recent Labs: 06/17/2022: Magnesium 1.9 08/09/2022: BUN 27; Creatinine, Ser 1.95; Hemoglobin 15.2; Platelets 174; Potassium 4.3; Sodium 138 08/11/2022: ALT 27  Recent  Lipid Panel    Component Value Date/Time   CHOL 99 (L) 08/11/2022 1057   TRIG 192 (H) 08/11/2022 1057   HDL 32 (L) 08/11/2022 1057   CHOLHDL 3.1 08/11/2022 1057   CHOLHDL 5.0 06/15/2022 0755   VLDL 74 (H) 06/15/2022 0755   LDLCALC 36 08/11/2022 1057   LDLDIRECT 67 07/15/2021 0912     Risk Assessment/Calculations:              Physical Exam:    VS:  BP (!) 140/80 (BP Location: Left Arm, Patient Position: Sitting, Cuff Size: Normal)   Pulse 72   Ht 5' 9.5" (1.765 m)   Wt 200 lb (90.7 kg)   SpO2 94%   BMI 29.11 kg/m     Wt Readings from Last 3 Encounters:  08/15/22 200 lb (90.7 kg)  07/27/22 203 lb 0.7 oz (92.1 kg)  06/30/22 205 lb 12.8 oz (93.4 kg)     GEN:  Well nourished, well developed in no acute distress HEENT: Normal NECK: No JVD; No carotid bruits LYMPHATICS: No lymphadenopathy CARDIAC: Irregularly irregular , no murmurs, no rubs, gallops RESPIRATORY:  Clear to auscultation without rales, wheezing or rhonchi  ABDOMEN: Soft, non-tender, non-distended MUSCULOSKELETAL:  No edema; No deformity  SKIN: Warm and dry NEUROLOGIC:  Alert and oriented x 3 PSYCHIATRIC:  Normal affect   ASSESSMENT:    1. Abdominal aortic aneurysm (AAA) without rupture, unspecified part ([place])   2. Aneurysm of ascending aorta without rupture (  [place])   3. Type II endoleak of aortic graft     PLAN:    In order of problems listed above:  Preoperative back surgery - Complicated situation.  Prior Plavix hold and Eliquis hold resulted in ST elevation myocardial infarction of a small posterior lateral branch of the RCA vein graft.  Unsuccessful PCI.  Cardiac catheterization reviewed.  Personally discussed with interventional list on his case, Dr. Ali Lowe.  We are going to continue his Plavix for a total of 3 months.  At that point, it would be reasonable to once again attempt back surgery.  He had a low risk nuclear stress test prior to his STEMI.  This indicates that the posterior lateral  branch involved was very small in territory and overall low risk.  His EF also was preserved at 60%.  He may proceed with moderate overall cardiac risk.  He understands that there may be potential chance again for thrombosis to occur.  In consultation with interventional team, he would not require Lovenox or to be 3A inhibitor/cangrelor etc. prior to surgery. He understands his potential risks but also understands that his back pain is severe.  He is willing to proceed.  STEMI -As described above.  Plavix for 3 months then able to hold for potential back surgery.  Resume Plavix and Eliquis post back surgery. -Participating cardiac rehab well.  Hypertension - Multiple blood pressures reviewed from cardiac rehab.  Most of them are excellent.  At goal. -Continue with current medication management.  No changes made.  Chronic kidney disease stage IIIb - Creatinine from 1.8-2.1.  Diabetes with chronic kidney disease - Hemoglobin A1c 6.7.  His home [place] was held previously secondary to elevated creatinine and decreased GFR.  Per primary team.  AAA status post EVAR Ascending aortic aneurysm - On Plavix.  Dr. Laurence Ferrari with interventional radiology has been monitoring endoleak. -Dr. Cyndia Bent has been monitoring his 48 mm ascending aortic aneurysm. - We will consolidate the CT scan to evaluate both his ascending and descending aorta and 1 scan.  We will send the information to both physicians for review.      Medication Adjustments/Labs and Tests Ordered: Current medicines are reviewed at length with the patient today.  Concerns regarding medicines are outlined above.  Orders Placed This Encounter  Procedures   CT CHEST ABDOMEN PELVIS W CONTRAST   CTA CHEST/ABD/PEL DISSECTION W&/OR WO & GATING   No orders of the defined types were placed in this encounter.   Patient Instructions  Medication Instructions:  The current medical regimen is effective;  continue present plan and  medications.  *If you need a refill on your cardiac medications before your next appointment, please call your pharmacy*   Testing/Procedures: CT of Chest, abdomen and pelvis -  CT scanning, (CAT scanning), is a noninvasive, special x-ray that produces cross-sectional images of the body using x-rays and a computer. CT scans help physicians diagnose and treat medical conditions. For some CT exams, a contrast material is used to enhance visibility in the area of the body being studied. CT scans provide greater clarity and reveal more details than regular x-ray exams.  Follow-Up: At [facility], you and your health needs are our priority.  As part of our continuing mission to provide you with exceptional heart care, we have created designated Provider Care Teams.  These Care Teams include your primary Cardiologist (physician) and Advanced Practice Providers (APPs -  Physician Assistants and Nurse Practitioners) who all work together to provide  you with the care you need, when you need it.  We recommend signing up for the patient portal called "MyChart".  Sign up information is provided on this After Visit Summary.  MyChart is used to connect with patients for Virtual Visits (Telemedicine).  Patients are able to view lab/test results, encounter notes, upcoming appointments, etc.  Non-urgent messages can be sent to your provider as well.   To learn more about what you can do with MyChart, go to [email or web address].    Your next appointment:   6 month(s)  The format for your next appointment:   In Person  Provider:   Dr Marlou Porch    Important Information About Sugar         Signed, Candee Furbish, MD  08/15/2022 10:07 AM    [facility]  I, Candee Furbish, MD, have reviewed all documentation for this visit. The documentation on 08/15/22 for the exam, diagnosis, procedures, and orders are all accurate and complete.

## 2022-08-16 ENCOUNTER — Encounter (HOSPITAL_COMMUNITY)
Admission: RE | Admit: 2022-08-16 | Discharge: 2022-08-16 | Disposition: A | Discharge status: Home / Self Care | Payer: Medicare Other | Source: Ambulatory Visit | Attending: Cardiology | Admitting: Cardiology

## 2022-08-16 DIAGNOSIS — Z5189 Encounter for other specified aftercare: Secondary | ICD-10-CM | POA: Diagnosis not present

## 2022-08-16 DIAGNOSIS — I252 Old myocardial infarction: Secondary | ICD-10-CM | POA: Diagnosis not present

## 2022-08-16 DIAGNOSIS — I2129 ST elevation (STEMI) myocardial infarction involving other sites: Secondary | ICD-10-CM

## 2022-08-16 DIAGNOSIS — Z9862 Peripheral vascular angioplasty status: Secondary | ICD-10-CM | POA: Diagnosis not present

## 2022-08-17 DIAGNOSIS — D631 Anemia in chronic kidney disease: Secondary | ICD-10-CM | POA: Diagnosis not present

## 2022-08-17 DIAGNOSIS — Z23 Encounter for immunization: Secondary | ICD-10-CM | POA: Diagnosis not present

## 2022-08-17 DIAGNOSIS — E892 Postprocedural hypoparathyroidism: Secondary | ICD-10-CM | POA: Diagnosis not present

## 2022-08-17 DIAGNOSIS — N2581 Secondary hyperparathyroidism of renal origin: Secondary | ICD-10-CM | POA: Diagnosis not present

## 2022-08-17 DIAGNOSIS — N1832 Chronic kidney disease, stage 3b: Secondary | ICD-10-CM | POA: Diagnosis not present

## 2022-08-17 DIAGNOSIS — I129 Hypertensive chronic kidney disease with stage 1 through stage 4 chronic kidney disease, or unspecified chronic kidney disease: Secondary | ICD-10-CM | POA: Diagnosis not present

## 2022-08-17 DIAGNOSIS — Z791 Long term (current) use of non-steroidal anti-inflammatories (NSAID): Secondary | ICD-10-CM | POA: Diagnosis not present

## 2022-08-18 ENCOUNTER — Encounter (HOSPITAL_COMMUNITY)
Admission: RE | Admit: 2022-08-18 | Discharge: 2022-08-18 | Disposition: A | Discharge status: Home / Self Care | Payer: Medicare Other | Source: Ambulatory Visit | Attending: Cardiology | Admitting: Cardiology

## 2022-08-18 DIAGNOSIS — Z9862 Peripheral vascular angioplasty status: Secondary | ICD-10-CM | POA: Diagnosis not present

## 2022-08-18 DIAGNOSIS — Z5189 Encounter for other specified aftercare: Secondary | ICD-10-CM | POA: Diagnosis not present

## 2022-08-18 DIAGNOSIS — I2129 ST elevation (STEMI) myocardial infarction involving other sites: Secondary | ICD-10-CM

## 2022-08-18 DIAGNOSIS — I252 Old myocardial infarction: Secondary | ICD-10-CM | POA: Diagnosis not present

## 2022-08-21 ENCOUNTER — Encounter (HOSPITAL_COMMUNITY)
Admission: RE | Admit: 2022-08-21 | Discharge: 2022-08-21 | Disposition: A | Discharge status: Home / Self Care | Payer: Medicare Other | Source: Ambulatory Visit | Attending: Cardiology | Admitting: Cardiology

## 2022-08-21 DIAGNOSIS — I252 Old myocardial infarction: Secondary | ICD-10-CM | POA: Diagnosis not present

## 2022-08-21 DIAGNOSIS — Z9862 Peripheral vascular angioplasty status: Secondary | ICD-10-CM | POA: Diagnosis not present

## 2022-08-21 DIAGNOSIS — I2129 ST elevation (STEMI) myocardial infarction involving other sites: Secondary | ICD-10-CM

## 2022-08-21 DIAGNOSIS — Z5189 Encounter for other specified aftercare: Secondary | ICD-10-CM | POA: Diagnosis not present

## 2022-08-21 NOTE — Progress Notes (Signed)
Reviewed home exercise Rx with patient today.  Encouraged warm-up, cool-down, and stretching. Reviewed THRR of  56 - 113 and keeping RPE between 11-13. Encouraged to hydrate with activity.  Reviewed weather parameters for temperature and humidity for safe exercise outdoors. Reviewed S/S to terminate exercise and when to call 911 vs MD. Reviewed the use of NTG and pt was encouraged to carry at all times. It was stressed to patient to know his blood sugar prior to exercise, as well as carrying a rapid acting glucose snack. Pt encouraged to always carry a cell phone for safety when exercising outdoors. Pt verbalized understanding of the home exercise Rx and was provided a copy.   Lesly Rubenstein MS, ACSM-CEP, CCRP

## 2022-08-22 ENCOUNTER — Ambulatory Visit (HOSPITAL_COMMUNITY)
Admission: RE | Admit: 2022-08-22 | Discharge: 2022-08-22 | Disposition: A | Discharge status: Home / Self Care | Payer: Medicare Other | Source: Ambulatory Visit | Attending: Cardiology | Admitting: Cardiology

## 2022-08-22 ENCOUNTER — Ambulatory Visit (HOSPITAL_COMMUNITY): Payer: Medicare Other

## 2022-08-22 DIAGNOSIS — I723 Aneurysm of iliac artery: Secondary | ICD-10-CM | POA: Diagnosis not present

## 2022-08-22 DIAGNOSIS — T82858A Stenosis of vascular prosthetic devices, implants and grafts, initial encounter: Secondary | ICD-10-CM | POA: Diagnosis not present

## 2022-08-22 DIAGNOSIS — I9789 Other postprocedural complications and disorders of the circulatory system, not elsewhere classified: Secondary | ICD-10-CM | POA: Insufficient documentation

## 2022-08-22 DIAGNOSIS — N200 Calculus of kidney: Secondary | ICD-10-CM | POA: Diagnosis not present

## 2022-08-22 DIAGNOSIS — I714 Abdominal aortic aneurysm, without rupture, unspecified: Secondary | ICD-10-CM | POA: Diagnosis not present

## 2022-08-22 DIAGNOSIS — I7121 Aneurysm of the ascending aorta, without rupture: Secondary | ICD-10-CM | POA: Diagnosis not present

## 2022-08-22 DIAGNOSIS — I513 Intracardiac thrombosis, not elsewhere classified: Secondary | ICD-10-CM | POA: Diagnosis not present

## 2022-08-22 DIAGNOSIS — K8020 Calculus of gallbladder without cholecystitis without obstruction: Secondary | ICD-10-CM | POA: Diagnosis not present

## 2022-08-22 DIAGNOSIS — I712 Thoracic aortic aneurysm, without rupture, unspecified: Secondary | ICD-10-CM | POA: Diagnosis not present

## 2022-08-22 MED ORDER — IOHEXOL 350 MG/ML SOLN
125.0000 mL | Freq: Once | INTRAVENOUS | Status: AC | PRN
  Administered 2022-08-22: 120 mL via INTRAVENOUS

## 2022-08-22 NOTE — Progress Notes (Signed)
Cardiac Individual Treatment Plan  Patient Details  Name: Garrett POKE Sr. MRN: [id number] Date of Birth: 01-05-1941 Referring Provider:   Flowsheet Row INTENSIVE CARDIAC REHAB ORIENT from 07/27/2022 in [facility]  Referring Provider Dr Candee Furbish, MD       Initial Encounter Date:  [place] from 07/27/2022 in [place]  Date 07/27/22       Visit Diagnosis: 06/15/22 STEMI, unsuccessful PCI, SVG  Patient's Home Medications on Admission:  Current Outpatient Medications:    ACCU-CHEK GUIDE test strip, USE TWICE DAILY, Disp: 200 strip, Rfl: 3   acetaminophen (TYLENOL) 650 MG CR tablet, Take 650 mg by mouth every 8 (eight) hours as needed for pain., Disp: , Rfl:    allopurinol (ZYLOPRIM) 300 MG tablet, Take 300 mg by mouth every other day. Take in the evening - on even days of the month, Disp: , Rfl:    atorvastatin (LIPITOR) 40 MG tablet, Take 1 tablet (40 mg total) by mouth daily., Disp: 30 tablet, Rfl: 11   Cholecalciferol (VITAMIN D3) 2000 UNITS TABS, Take 2,000 Units by mouth daily. , Disp: , Rfl:    clonazePAM (KLONOPIN) 1 MG tablet, Take 0.5 mg by mouth at bedtime., Disp: , Rfl:    clopidogrel (PLAVIX) 75 MG tablet, TAKE 1 TABLET BY MOUTH  DAILY, Disp: 90 tablet, Rfl: 3   Coenzyme Q10 300 MG CAPS, Take 300 mg by mouth daily., Disp: , Rfl:    Cyanocobalamin (VITAMIN B-12 PO), Take 2,500 mcg by mouth daily., Disp: , Rfl:    ELIQUIS 5 MG TABS tablet, TAKE 1 TABLET BY MOUTH TWICE A DAY, Disp: 180 tablet, Rfl: 1   ezetimibe (ZETIA) 10 MG tablet, TAKE 1 TABLET BY MOUTH  DAILY, Disp: 90 tablet, Rfl: 3   fenofibrate micronized (LOFIBRA) 200 MG capsule, Take 1 capsule (200 mg total) by mouth daily before breakfast., Disp: 90 capsule, Rfl: 3   Ferrous Sulfate Dried (HIGH POTENCY IRON) 65 MG TABS, Take 65 mg by mouth daily., Disp: , Rfl:    fluorouracil (EFUDEX) 5 % cream, Apply 1  Application topically daily as needed (cancer spots). Use as directed, Disp: , Rfl:    fluticasone (FLONASE) 50 MCG/ACT nasal spray, Place 2 sprays into both nostrils daily as needed for allergies., Disp: , Rfl:    glipiZIDE (GLUCOTROL) 5 MG tablet, Take 0.5 tablets (2.5 mg total) by mouth daily before breakfast AND 1 tablet (5 mg total) daily before supper., Disp: 135 tablet, Rfl: 3   levothyroxine (SYNTHROID, LEVOTHROID) 175 MCG tablet, Take 175 mcg by mouth daily before breakfast., Disp: , Rfl:    lidocaine (ASPERCREME LIDOCAINE) 4 %, Place 1 patch onto the skin daily as needed (pain)., Disp: , Rfl:    loratadine (CLARITIN) 10 MG tablet, Take 10 mg by mouth at bedtime., Disp: , Rfl:    magnesium oxide (MAG-OX) 400 (240 Mg) MG tablet, Take 400 mg by mouth daily., Disp: , Rfl:    metoprolol tartrate (LOPRESSOR) 25 MG tablet, TAKE 1 TABLET BY MOUTH  TWICE DAILY, Disp: 180 tablet, Rfl: 3   Multiple Vitamins-Minerals (PRESERVISION AREDS 2) CAPS, Take 1 capsule by mouth 2 (two) times daily., Disp: , Rfl:    nitroGLYCERIN (NITROSTAT) 0.4 MG SL tablet, Place 1 tablet (0.4 mg total) under the tongue every 5 (five) minutes as needed for chest pain., Disp: 25 tablet, Rfl: 3   olmesartan-hydrochlorothiazide (BENICAR HCT) 20-12.5 MG tablet, Take  1 tablet by mouth daily., Disp: 90 tablet, Rfl: 3   pantoprazole (PROTONIX) 40 MG tablet, TAKE 1 TABLET BY MOUTH DAILY, Disp: 90 tablet, Rfl: 3   Polyethyl Glycol-Propyl Glycol (SYSTANE OP), Place 1 drop into both eyes daily as needed (dry/irritated eyes)., Disp: , Rfl:    Tamsulosin HCl (FLOMAX) 0.4 MG CAPS, Take 0.4 mg by mouth every other day. Take in the evening - on even days of the month, Disp: , Rfl:    traMADol (ULTRAM) 50 MG tablet, Take 50 mg by mouth as needed., Disp: , Rfl:    triamcinolone cream (KENALOG) 0.1 %, Apply 1 application  topically daily as needed (rash)., Disp: , Rfl: 2   TURMERIC PO, Take 2 tablets by mouth in the morning and at bedtime.,  Disp: , Rfl:    VASCEPA 1 g capsule, TAKE 2 CAPSULES BY MOUTH 2 TIMES DAILY., Disp: 360 capsule, Rfl: 3  Past Medical History: Past Medical History:  Diagnosis Date   AAA (abdominal aortic aneurysm) ([place])    Arthritis    knees   Atrial fibrillation (HCC)    CAD (coronary artery disease)    Cancer (HCC)    prostate   CKD (chronic kidney disease), stage III ([place]) 06/18/2022   COVID    06/2019 and in 2022 states they were mild cases   Diabetes mellitus without complication ([place])    Dysrhythmia    A-fib pt is on Eliquis   GERD (gastroesophageal reflux disease)    Gout    History of kidney stones    Hyperlipidemia    Hypertension    Hypothyroidism    Myocardial infarction ([place]) 01/1991   sp inferior   Nerve compression    right leg   Reflux    Seasonal allergies    Thoracic ascending aortic aneurysm (HCC)    Thyroid disease    hypothyroidism    Tobacco Use: Social History   Tobacco Use  Smoking Status Former   Types: Cigarettes   Quit date: 08/01/1970   Years since quitting: 52.0  Smokeless Tobacco Never    Labs: Review Flowsheet  More data exists      Latest Ref Rng & Units 09/29/2021 03/30/2022 06/13/2022 06/15/2022 08/11/2022  Labs for ITP Cardiac and Pulmonary Rehab  Cholestrol 100 - 199 mg/dL - - - 149  99   LDL (calc) 0 - 99 mg/dL - - - 45  36   HDL-C >39 mg/dL - - - 30  32   Trlycerides 0 - 149 mg/dL - - - 372  192   Hemoglobin A1c 4.8 - 5.6 % 6.9  7.0  6.7  6.7  -    Capillary Blood Glucose: Lab Results  Component Value Date   GLUCAP 183 (H) 08/04/2022   GLUCAP 218 (H) 08/04/2022   GLUCAP 211 (H) 08/02/2022   GLUCAP 267 (H) 08/02/2022   GLUCAP 214 (H) 07/31/2022     Exercise Target Goals: Exercise Program Goal: Individual exercise prescription set using results from initial 6 min walk test and THRR while considering  patient's activity barriers and safety.   Exercise Prescription Goal: Initial exercise prescription builds to 30-45 minutes a day of  aerobic activity, 2-3 days per week.  Home exercise guidelines will be given to patient during program as part of exercise prescription that the participant will acknowledge.  Activity Barriers & Risk Stratification:  Activity Barriers & Cardiac Risk Stratification - 07/27/22 1504       Activity Barriers & Cardiac Risk  Stratification   Activity Barriers Back Problems;Neck/Spine Problems;Joint Problems;Deconditioning;Balance Concerns    Cardiac Risk Stratification High   2.54 mets on 6MWT            6 Minute Walk:  6 Minute Walk     Row Name 07/27/22 1307         6 Minute Walk   Phase Initial     Distance 1448 feet     Walk Time 6 minutes     # of Rest Breaks 0     MPH 2.74     METS 2.54     RPE 11     Perceived Dyspnea  1     VO2 Peak 8.9     Symptoms Yes (comment)     Comments bilateral hip pain, 3/10. resolved with rest     Resting HR 71 bpm     Resting BP 118/72     Resting Oxygen Saturation  97 %     Exercise Oxygen Saturation  during 6 min walk 97 %     Max Ex. HR 95 bpm     Max Ex. BP 140/82     2 Minute Post BP 126/70              Oxygen Initial Assessment:   Oxygen Re-Evaluation:   Oxygen Discharge (Final Oxygen Re-Evaluation):   Initial Exercise Prescription:  Initial Exercise Prescription - 07/27/22 1500       Date of Initial Exercise RX and Referring Provider   Date 07/27/22    Referring Provider Dr Candee Furbish, MD    Expected Discharge Date 09/22/22      Recumbant Bike   Level 2    RPM 60    Minutes 15    METs 2.2      NuStep   Level 2    SPM 75    Minutes 15    METs 2.2      Prescription Details   Frequency (times per week) 3    Duration Progress to 30 minutes of continuous aerobic without signs/symptoms of physical distress      Intensity   THRR 40-80% of Max Heartrate 56-113    Ratings of Perceived Exertion 11-13    Perceived Dyspnea 0-4      Progression   Progression Continue progressive overload as per policy  without signs/symptoms or physical distress.      Resistance Training   Training Prescription Yes    Weight 3    Reps 10-15             Perform Capillary Blood Glucose checks as needed.  Exercise Prescription Changes:   Exercise Prescription Changes     Row Name 07/31/22 1100 08/16/22 1400 08/21/22 1100         Response to Exercise   Blood Pressure (Admit) 122/70 128/74 122/72     Blood Pressure (Exercise) 128/68 118/68 150/82     Blood Pressure (Exit) 120/60 110/70 102/78     Heart Rate (Admit) 71 bpm 68 bpm 70 bpm     Heart Rate (Exercise) 113 bpm 86 bpm 93 bpm     Heart Rate (Exit) 69 bpm 68 bpm 70 bpm     Rating of Perceived Exertion (Exercise) '12 12 12     '$ Symptoms None None None     Comments Pt's first day in the cRP2 program Reviewed METs Reviewed Home exercise Rx     Duration Continue with 30 min of aerobic exercise without signs/symptoms  of physical distress. Continue with 30 min of aerobic exercise without signs/symptoms of physical distress. Continue with 30 min of aerobic exercise without signs/symptoms of physical distress.     Intensity THRR unchanged THRR unchanged THRR unchanged       Progression   Progression Continue to progress workloads to maintain intensity without signs/symptoms of physical distress. Continue to progress workloads to maintain intensity without signs/symptoms of physical distress. Continue to progress workloads to maintain intensity without signs/symptoms of physical distress.     Average METs 2.45 2.75 2.9       Resistance Training   Training Prescription Yes No Yes     Weight 3 No weights on Wednesdays 3 lb wts     Reps 10-15 -- --     Time 10 Minutes -- --       Interval Training   Interval Training No No No       Bike   Level -- -- 1     Minutes -- -- 15     METs -- -- 3.08       Recumbant Bike   Level 2 2.5 --     Minutes 15 15 --     METs 2.2 2.5 --       NuStep   Level '2 4 4     '$ SPM 102 -- --     Minutes '15 15  15     '$ METs 2.7 3 2.7       Home Exercise Plan   Plans to continue exercise at -- -- [facility] (comment)     Frequency -- -- Add 2 additional days to program exercise sessions.     Initial Home Exercises Provided -- -- 08/21/22              Exercise Comments:   Exercise Comments     Row Name 07/31/22 1158 08/16/22 1434 08/21/22 1156       Exercise Comments Pt's first day in the CRP2 program. No complaints with todays session. Reviwed METs today. Pt making good progress, increased workload on Nustep. Pt requests to try [employer] bike next session instead of recumebnt bike. Reviewed home exericse Rx. Pt will walk 2x/week for 30 minutes. Pt may return to the the [facility] for exercise. Pt verbalized understanding of the home exercise Rx and was provided a copy.              Exercise Goals and Review:   Exercise Goals     Row Name 07/27/22 1507             Exercise Goals   Increase Physical Activity Yes       Intervention Provide advice, education, support and counseling about physical activity/exercise needs.;Develop an individualized exercise prescription for aerobic and resistive training based on initial evaluation findings, risk stratification, comorbidities and participant's personal goals.       Expected Outcomes Short Term: Attend rehab on a regular basis to increase amount of physical activity.;Long Term: Add in home exercise to make exercise part of routine and to increase amount of physical activity.;Long Term: Exercising regularly at least 3-5 days a week.       Increase Strength and Stamina Yes       Intervention Provide advice, education, support and counseling about physical activity/exercise needs.;Develop an individualized exercise prescription for aerobic and resistive training based on initial evaluation findings, risk stratification, comorbidities and participant's personal goals.       Expected Outcomes Short Term:  Increase workloads from initial  exercise prescription for resistance, speed, and METs.;Short Term: Perform resistance training exercises routinely during rehab and add in resistance training at home;Long Term: Improve cardiorespiratory fitness, muscular endurance and strength as measured by increased METs and functional capacity (6MWT)       Able to understand and use rate of perceived exertion (RPE) scale Yes       Intervention Provide education and explanation on how to use RPE scale       Expected Outcomes Short Term: Able to use RPE daily in rehab to express subjective intensity level;Long Term:  Able to use RPE to guide intensity level when exercising independently       Knowledge and understanding of Target Heart Rate Range (THRR) Yes       Intervention Provide education and explanation of THRR including how the numbers were predicted and where they are located for reference       Expected Outcomes Short Term: Able to state/look up THRR;Short Term: Able to use daily as guideline for intensity in rehab;Long Term: Able to use THRR to govern intensity when exercising independently       Understanding of Exercise Prescription Yes       Intervention Provide education, explanation, and written materials on patient's individual exercise prescription       Expected Outcomes Short Term: Able to explain program exercise prescription;Long Term: Able to explain home exercise prescription to exercise independently                Exercise Goals Re-Evaluation :  Exercise Goals Re-Evaluation     [place] Name 07/31/22 1157             Exercise Goal Re-Evaluation   Exercise Goals Review Increase Physical Activity;Increase Strength and Stamina;Able to understand and use rate of perceived exertion (RPE) scale;Knowledge and understanding of Target Heart Rate Range (THRR);Understanding of Exercise Prescription       Comments Pt's first day in the CRP2 program. Pt understands the exercise Rx, RPE sclae and THRR.       Expected Outcomes  Will continue to monitor patient and progress exercise workloads as tolerated.                Discharge Exercise Prescription (Final Exercise Prescription Changes):  Exercise Prescription Changes - 08/21/22 1100       Response to Exercise   Blood Pressure (Admit) 122/72    Blood Pressure (Exercise) 150/82    Blood Pressure (Exit) 102/78    Heart Rate (Admit) 70 bpm    Heart Rate (Exercise) 93 bpm    Heart Rate (Exit) 70 bpm    Rating of Perceived Exertion (Exercise) 12    Symptoms None    Comments Reviewed Home exercise Rx    Duration Continue with 30 min of aerobic exercise without signs/symptoms of physical distress.    Intensity THRR unchanged      Progression   Progression Continue to progress workloads to maintain intensity without signs/symptoms of physical distress.    Average METs 2.9      Resistance Training   Training Prescription Yes    Weight 3 lb wts      Interval Training   Interval Training No      Bike   Level 1    Minutes 15    METs 3.08      NuStep   Level 4    Minutes 15    METs 2.7      Home Exercise  Plan   Plans to continue exercise at [facility] (comment)    Frequency Add 2 additional days to program exercise sessions.    Initial Home Exercises Provided 08/21/22             Nutrition:  Target Goals: Understanding of nutrition guidelines, daily intake of sodium '1500mg'$ , cholesterol '200mg'$ , calories 30% from fat and 7% or less from saturated fats, daily to have 5 or more servings of fruits and vegetables.  Biometrics:  Pre Biometrics - 07/27/22 1302       Pre Biometrics   Waist Circumference 43 inches    Hip Circumference 41.5 inches    Waist to Hip Ratio 1.04 %    Triceps Skinfold 9 mm    % Body Fat 27.8 %    Grip Strength 42 kg    Flexibility 13.5 in    Single Leg Stand 2 seconds              Nutrition Therapy Plan and Nutrition Goals:  Nutrition Therapy & Goals - 08/21/22 0955       Nutrition Therapy    Diet Heart Healthy Diet    Drug/Food Interactions Statins/Certain Fruits      Personal Nutrition Goals   Nutrition Goal Patient to identify strategies for managing cardiovascular risk by attending the weekly nutiriton educatoin and nutrition courses    Personal Goal #2 Patient to identify food sources and limit daily intake of saturated fat, trans fat, sodium, and refined carbohydrates    Personal Goal #3 Patient to reduce sodium to '1500mg'$  daily    Comments Garrett Jordan has started making some lifestyle changes to aid with adhereance to a heart healthy diet. He continues to attend the [employer] education and nutrition series. He has started using his air-fryer more to aid with reducing fried foods. He has started to increase vegetables and fish intake. He continues Vascepa and Fenofibrate for elevated triglycerides; triglycerides remains elevated but improved. He has maintained weight since starting with our program.      Intervention Plan   Intervention Prescribe, educate and counsel regarding individualized specific dietary modifications aiming towards targeted core components such as weight, hypertension, lipid management, diabetes, heart failure and other comorbidities.;Nutrition handout(s) given to patient.    Expected Outcomes Short Term Goal: Understand basic principles of dietary content, such as calories, fat, sodium, cholesterol and nutrients.;Long Term Goal: Adherence to prescribed nutrition plan.             Nutrition Assessments:  Nutrition Assessments - 07/31/22 1508       Rate Your Plate Scores   Pre Score 43            MEDIFICTS Score Key: ?70 Need to make dietary changes  40-70 Heart Healthy Diet ? 40 Therapeutic Level Cholesterol Diet   Flowsheet Row INTENSIVE CARDIAC REHAB from 07/31/2022 in [place]  Picture Your Plate Total Score on Admission 43      Picture Your Plate Scores: <95 Unhealthy dietary pattern with much room for  improvement. 41-50 Dietary pattern unlikely to meet recommendations for good health and room for improvement. 51-60 More healthful dietary pattern, with some room for improvement.  >60 Healthy dietary pattern, although there may be some specific behaviors that could be improved.    Nutrition Goals Re-Evaluation:  Nutrition Goals Re-Evaluation     [place] Name 07/31/22 0955 08/21/22 0955           Goals   Current Weight 203 lb 7.8  oz (92.3 kg) 202 lb 13.2 oz (92 kg)      Comment VLDL 74, HDL 30, triglycerides 372, GFR 51, CR 1.41, A1c 6.7 Triglycerides improved but still elevated 192, HDL 32, GFR 34, Cr 1.95. A1c has not been re-checked but most recent labs show A1c of 6.7 on 06/15/2022.      Expected Outcome Patient has previously completed cardiac rehab and the maintenance program. Lipid panel remains elevated. Garrett Jordan has started making some lifestyle changes to aid with adhereance to a heart healthy diet. He continues to attend the [employer] education and nutrition series. He has started using his air-fryer more to aid with reducing fried foods. He has started to increase vegetables and fish intake. He continues Vascepa and Fenofibrate for elevated triglycerides; triglycerides remains elevated but improved. He has maintained weight since starting with our program.               Nutrition Goals Re-Evaluation:  Nutrition Goals Re-Evaluation     [place] Name 07/31/22 0955 08/21/22 0955           Goals   Current Weight 203 lb 7.8 oz (92.3 kg) 202 lb 13.2 oz (92 kg)      Comment VLDL 74, HDL 30, triglycerides 372, GFR 51, CR 1.41, A1c 6.7 Triglycerides improved but still elevated 192, HDL 32, GFR 34, Cr 1.95. A1c has not been re-checked but most recent labs show A1c of 6.7 on 06/15/2022.      Expected Outcome Patient has previously completed cardiac rehab and the maintenance program. Lipid panel remains elevated. Garrett Jordan has started making some lifestyle changes to aid with adhereance to a heart healthy  diet. He continues to attend the [employer] education and nutrition series. He has started using his air-fryer more to aid with reducing fried foods. He has started to increase vegetables and fish intake. He continues Vascepa and Fenofibrate for elevated triglycerides; triglycerides remains elevated but improved. He has maintained weight since starting with our program.               Nutrition Goals Discharge (Final Nutrition Goals Re-Evaluation):  Nutrition Goals Re-Evaluation - 08/21/22 0955       Goals   Current Weight 202 lb 13.2 oz (92 kg)    Comment Triglycerides improved but still elevated 192, HDL 32, GFR 34, Cr 1.95. A1c has not been re-checked but most recent labs show A1c of 6.7 on 06/15/2022.    Expected Outcome Garrett Jordan has started making some lifestyle changes to aid with adhereance to a heart healthy diet. He continues to attend the [employer] education and nutrition series. He has started using his air-fryer more to aid with reducing fried foods. He has started to increase vegetables and fish intake. He continues Vascepa and Fenofibrate for elevated triglycerides; triglycerides remains elevated but improved. He has maintained weight since starting with our program.             Psychosocial: Target Goals: Acknowledge presence or absence of significant depression and/or stress, maximize coping skills, provide positive support system. Participant is able to verbalize types and ability to use techniques and skills needed for reducing stress and depression.  Initial Review & Psychosocial Screening:  Initial Psych Review & Screening - 07/27/22 1212       Initial Review   Current issues with None Identified      Family Dynamics   Good Support System? Yes   Tom lives alone he has his Son for support. Tom's wife passed away 3  years ago     Barriers   Psychosocial barriers to participate in program There are no identifiable barriers or psychosocial needs.      Screening  Interventions   Interventions Encouraged to exercise             Quality of Life Scores:  Quality of Life - 07/27/22 1508       Quality of Life   Select Quality of Life      Quality of Life Scores   Health/Function Pre 22.21 %    Socioeconomic Pre 23.71 %    Psych/Spiritual Pre 25.57 %    Family Pre 28.8 %    GLOBAL Pre 24.16 %            Scores of 19 and below usually indicate a poorer quality of life in these areas.  A difference of  2-3 points is a clinically meaningful difference.  A difference of 2-3 points in the total score of the Quality of Life Index has been associated with significant improvement in overall quality of life, self-image, physical symptoms, and general health in studies assessing change in quality of life.  PHQ-9: Review Flowsheet       07/27/2022  Depression screen PHQ 2/9  Decreased Interest 0  Down, Depressed, Hopeless 0  PHQ - 2 Score 0   Interpretation of Total Score  Total Score Depression Severity:  1-4 = Minimal depression, 5-9 = Mild depression, 10-14 = Moderate depression, 15-19 = Moderately severe depression, 20-27 = Severe depression   Psychosocial Evaluation and Intervention:   Psychosocial Re-Evaluation:  Psychosocial Re-Evaluation     [place] Name 07/31/22 1121 08/22/22 1629           Psychosocial Re-Evaluation   Current issues with None Identified None Identified      Interventions Encouraged to attend Cardiac Rehabilitation for the exercise Encouraged to attend Cardiac Rehabilitation for the exercise      Continue Psychosocial Services  No Follow up required No Follow up required               Psychosocial Discharge (Final Psychosocial Re-Evaluation):  Psychosocial Re-Evaluation - 08/22/22 1629       Psychosocial Re-Evaluation   Current issues with None Identified    Interventions Encouraged to attend Cardiac Rehabilitation for the exercise    Continue Psychosocial Services  No Follow up required              Vocational Rehabilitation: Provide vocational rehab assistance to qualifying candidates.   Vocational Rehab Evaluation & Intervention:  Vocational Rehab - 07/27/22 1213       Initial Vocational Rehab Evaluation & Intervention   Assessment shows need for Vocational Rehabilitation No   Garrett Jordan is retired and does not need vocational rehab at this time            Education: Education Goals: Education classes will be provided on a weekly basis, covering required topics. Participant will state understanding/return demonstration of topics presented.    Education     Row Name 07/31/22 0900     Education   Cardiac Education Topics [employer]   [employer] Workshops     Workshops   Educator Exercise Physiologist   Select Exercise   Exercise Workshop Exercise Basics: Building Your Action Plan   Instruction Review Code 1- Verbalizes Understanding   Class Start Time [phone]   Class Stop Time [id number]   Class Time Calculation (min) 43 min    [place] Name 08/02/22 0900  Education   Cardiac Education Topics [employer]   Educator Dietitian   Weekly Topic Efficiency Cooking - Meals in a Snap   Instruction Review Code 1- Verbalizes Understanding   Class Start Time 0809   Class Stop Time 0844   Class Time Calculation (min) 35 min    Row Name 08/04/22 0900     Education   Cardiac Education Topics Pritikin   Select Core Videos     Core Videos   Educator Exercise Physiologist   Select Exercise Education   Exercise Education Move It!   Instruction Review Code 1- Verbalizes Understanding   Class Start Time 0815   Class Stop Time 0853   Class Time Calculation (min) 38 min    Row Name 08/07/22 0900     Education   Cardiac Education Topics Pritikin   Select Core Videos     Core Videos   Educator Dietitian   Select Nutrition   Nutrition Nutrition Action Plan   Instruction Review Code 1- Verbalizes Understanding   Class Start Time 0815    Class Stop Time [phone]   Class Time Calculation (min) 37 min    [place] Name 08/09/22 0900     Education   Cardiac Education Topics [place]   Educator Dietitian   Weekly Topic Simple Sides and Sauces   Instruction Review Code 1- Verbalizes Understanding   Class Start Time 0809   Class Stop Time 0840   Class Time Calculation (min) 31 min    Row Name 08/11/22 0900     Education   Cardiac Education Topics Pritikin   Select Core Videos     Core Videos   Educator Nurse   Select General Education   General Education Hypertension and Heart Disease   Instruction Review Code 1- Verbalizes Understanding   Class Start Time 0815   Class Stop Time 0850   Class Time Calculation (min) 35 min    Row Name 08/14/22 0900     Education   Cardiac Education Topics Pritikin   Select Core Videos     Core Videos   Educator Dietitian   Select Nutrition   Nutrition Dining Out - Part 1   Instruction Review Code 1- Verbalizes Understanding   Class Start Time 0815   Class Stop Time 0850   Class Time Calculation (min) 35 min    [place] Name 08/16/22 0900     Education   Cardiac Education Topics [employer]   Educator Dietitian   Weekly Topic One-Pot Wonders   Instruction Review Code 1- Verbalizes Understanding   Class Start Time [phone]   Class Stop Time 0845   Class Time Calculation (min) 35 min    [place] Name 08/18/22 0900     Education   Cardiac Education Topics Pritikin   Select Workshops     Workshops   Educator Exercise Physiologist   Select Psychosocial   Psychosocial Workshop New Thoughts, New Behaviors   Instruction Review Code 1- Verbalizes Understanding   Class Start Time 0810   Class Stop Time 0848   Class Time Calculation (min) 38 min    [place] Name 08/21/22 0900     Education   Cardiac Education Topics Pritikin   Management consultant Exercise  Education   Exercise Education Biomechanial Limitations   Instruction Review Code 1- Verbalizes Understanding   Class Start Time 0815   Class Stop Time 0900   Class Time Calculation (min) 45 min            Core Videos: Exercise    Move It!  Clinical staff conducted group or individual video education with verbal and written material and guidebook.  Patient learns the recommended Pritikin exercise program. Exercise with the goal of living a long, healthy life. Some of the health benefits of exercise include controlled diabetes, healthier blood pressure levels, improved cholesterol levels, improved heart and lung capacity, improved sleep, and better body composition. Everyone should speak with their doctor before starting or changing an exercise routine.  Biomechanical Limitations Clinical staff conducted group or individual video education with verbal and written material and guidebook.  Patient learns how biomechanical limitations can impact exercise and how we can mitigate and possibly overcome limitations to have an impactful and balanced exercise routine.  Body Composition Clinical staff conducted group or individual video education with verbal and written material and guidebook.  Patient learns that body composition (ratio of muscle mass to fat mass) is a key component to assessing overall fitness, rather than body weight alone. Increased fat mass, especially visceral belly fat, can put [name] at increased risk for metabolic syndrome, type 2 diabetes, heart disease, and even death. It is recommended to combine diet and exercise (cardiovascular and resistance training) to improve your body composition. Seek guidance from your physician and exercise physiologist before implementing an exercise routine.  Exercise Action Plan Clinical staff conducted group or individual video education with verbal and written material and guidebook.  Patient learns the recommended strategies  to achieve and enjoy long-term exercise adherence, including variety, self-motivation, self-efficacy, and positive decision making. Benefits of exercise include fitness, good health, weight management, more energy, better sleep, less stress, and overall well-being.  Medical   Heart Disease Risk Reduction Clinical staff conducted group or individual video education with verbal and written material and guidebook.  Patient learns our heart is our most vital organ as it circulates oxygen, nutrients, white blood cells, and hormones throughout the entire body, and carries waste away. Data supports a plant-based eating plan like the Pritikin Program for its effectiveness in slowing progression of and reversing heart disease. The video provides a number of recommendations to address heart disease.   Metabolic Syndrome and Belly Fat  Clinical staff conducted group or individual video education with verbal and written material and guidebook.  Patient learns what metabolic syndrome is, how it leads to heart disease, and how one can reverse it and keep it from coming back. You have metabolic syndrome if you have 3 of the following 5 criteria: abdominal obesity, high blood pressure, high triglycerides, low HDL cholesterol, and high blood sugar.  Hypertension and Heart Disease Clinical staff conducted group or individual video education with verbal and written material and guidebook.  Patient learns that high blood pressure, or hypertension, is very common in the [place]. Hypertension is largely due to excessive salt intake, but other important risk factors include being overweight, physical inactivity, drinking too much alcohol, smoking, and not eating enough potassium from fruits and vegetables. High blood pressure is a leading risk factor for heart attack, stroke, congestive heart failure, dementia, kidney failure, and premature death. Long-term effects of excessive salt intake include stiffening of the  arteries and thickening of heart muscle and organ damage. Recommendations include ways  to reduce hypertension and the risk of heart disease.  Diseases of Our Time - Focusing on Diabetes Clinical staff conducted group or individual video education with verbal and written material and guidebook.  Patient learns why the best way to stop diseases of our time is prevention, through food and other lifestyle changes. Medicine (such as prescription pills and surgeries) is often only a Band-Aid on the problem, not a long-term solution. Most common diseases of our time include obesity, type 2 diabetes, hypertension, heart disease, and cancer. The Pritikin Program is recommended and has been proven to help reduce, reverse, and/or prevent the damaging effects of metabolic syndrome.  Nutrition   Overview of the Pritikin Eating Plan  Clinical staff conducted group or individual video education with verbal and written material and guidebook.  Patient learns about the [place] for disease risk reduction. The [place] emphasizes a wide variety of unrefined, minimally-processed carbohydrates, like fruits, vegetables, whole grains, and legumes. Go, Caution, and Stop food choices are explained. Plant-based and lean animal proteins are emphasized. Rationale provided for low sodium intake for blood pressure control, low added sugars for blood sugar stabilization, and low added fats and oils for coronary artery disease risk reduction and weight management.  Calorie Density  Clinical staff conducted group or individual video education with verbal and written material and guidebook.  Patient learns about calorie density and how it impacts the Pritikin Eating Plan. Knowing the characteristics of the food you choose will help you decide whether those foods will lead to weight gain or weight loss, and whether you want to consume more or less of them. Weight loss is usually a side effect of the Pritikin  Eating Plan because of its focus on low calorie-dense foods.  Label Reading  Clinical staff conducted group or individual video education with verbal and written material and guidebook.  Patient learns about the Pritikin recommended label reading guidelines and corresponding recommendations regarding calorie density, added sugars, sodium content, and whole grains.  Dining Out - Part 1  Clinical staff conducted group or individual video education with verbal and written material and guidebook.  Patient learns that restaurant meals can be sabotaging because they can be so high in calories, fat, sodium, and/or sugar. Patient learns recommended strategies on how to positively address this and avoid unhealthy pitfalls.  Facts on Fats  Clinical staff conducted group or individual video education with verbal and written material and guidebook.  Patient learns that lifestyle modifications can be just as effective, if not more so, as many medications for lowering your risk of heart disease. A Pritikin lifestyle can help to reduce your risk of inflammation and atherosclerosis (cholesterol build-up, or plaque, in the artery walls). Lifestyle interventions such as dietary choices and physical activity address the cause of atherosclerosis. A review of the types of fats and their impact on blood cholesterol levels, along with dietary recommendations to reduce fat intake is also included.  Nutrition Action Plan  Clinical staff conducted group or individual video education with verbal and written material and guidebook.  Patient learns how to incorporate Pritikin recommendations into their lifestyle. Recommendations include planning and keeping personal health goals in mind as an important part of their success.  Healthy Mind-Set    Healthy Minds, Bodies, Hearts  Clinical staff conducted group or individual video education with verbal and written material and guidebook.  Patient learns how to identify when they  are stressed. Video will discuss the impact of that stress, as  well as the many benefits of stress management. Patient will also be introduced to stress management techniques. The way we think, act, and feel has an impact on our hearts.  How Our Thoughts Can Heal Our Hearts  Clinical staff conducted group or individual video education with verbal and written material and guidebook.  Patient learns that negative thoughts can cause depression and anxiety. This can result in negative lifestyle behavior and serious health problems. Cognitive behavioral therapy is an effective method to help control our thoughts in order to change and improve our emotional outlook.  Additional Videos:  Exercise    Improving Performance  Clinical staff conducted group or individual video education with verbal and written material and guidebook.  Patient learns to use a non-linear approach by alternating intensity levels and lengths of time spent exercising to help burn more calories and lose more body fat. Cardiovascular exercise helps improve heart health, metabolism, hormonal balance, blood sugar control, and recovery from fatigue. Resistance training improves strength, endurance, balance, coordination, reaction time, metabolism, and muscle mass. Flexibility exercise improves circulation, posture, and balance. Seek guidance from your physician and exercise physiologist before implementing an exercise routine and learn your capabilities and proper form for all exercise.  Introduction to Yoga  Clinical staff conducted group or individual video education with verbal and written material and guidebook.  Patient learns about yoga, a discipline of the coming together of mind, breath, and body. The benefits of yoga include improved flexibility, improved range of motion, better posture and core strength, increased lung function, weight loss, and positive self-image. Yoga's heart health benefits include lowered blood pressure,  healthier heart rate, decreased cholesterol and triglyceride levels, improved immune function, and reduced stress. Seek guidance from your physician and exercise physiologist before implementing an exercise routine and learn your capabilities and proper form for all exercise.  Medical   Aging: Enhancing Your Quality of Life  Clinical staff conducted group or individual video education with verbal and written material and guidebook.  Patient learns key strategies and recommendations to stay in good physical health and enhance quality of life, such as prevention strategies, having an advocate, securing a [place], and keeping a list of medications and system for tracking them. It also discusses how to avoid risk for bone loss.  Biology of Weight Control  Clinical staff conducted group or individual video education with verbal and written material and guidebook.  Patient learns that weight gain occurs because we consume more calories than we burn (eating more, moving less). Even if your body weight is normal, you may have higher ratios of fat compared to muscle mass. Too much body fat puts you at increased risk for cardiovascular disease, heart attack, stroke, type 2 diabetes, and obesity-related cancers. In addition to exercise, following the [place] can help reduce your risk.  Decoding Lab Results  Clinical staff conducted group or individual video education with verbal and written material and guidebook.  Patient learns that lab test reflects one measurement whose values change over time and are influenced by many factors, including medication, stress, sleep, exercise, food, hydration, pre-existing medical conditions, and more. It is recommended to use the knowledge from this video to become more involved with your lab results and evaluate your numbers to speak with your doctor.   Diseases of Our Time - Overview  Clinical staff conducted group or  individual video education with verbal and written material and guidebook.  Patient learns that according to  the CDC, 50% to 70% of chronic diseases (such as obesity, type 2 diabetes, elevated lipids, hypertension, and heart disease) are avoidable through lifestyle improvements including healthier food choices, listening to satiety cues, and increased physical activity.  Sleep Disorders Clinical staff conducted group or individual video education with verbal and written material and guidebook.  Patient learns how good quality and duration of sleep are important to overall health and well-being. Patient also learns about sleep disorders and how they impact health along with recommendations to address them, including discussing with a physician.  Nutrition  Dining Out - Part 2 Clinical staff conducted group or individual video education with verbal and written material and guidebook.  Patient learns how to plan ahead and communicate in order to maximize their dining experience in a healthy and nutritious manner. Included are recommended food choices based on the type of restaurant the patient is visiting.   Fueling a Best boy conducted group or individual video education with verbal and written material and guidebook.  There is a strong connection between our food choices and our health. Diseases like obesity and type 2 diabetes are very prevalent and are in large-part due to lifestyle choices. The Pritikin Eating Plan provides plenty of food and hunger-curbing satisfaction. It is easy to follow, affordable, and helps reduce health risks.  Menu Workshop  Clinical staff conducted group or individual video education with verbal and written material and guidebook.  Patient learns that restaurant meals can sabotage health goals because they are often packed with calories, fat, sodium, and sugar. Recommendations include strategies to plan ahead and to communicate with the manager,  chef, or server to help order a healthier meal.  Planning Your Eating Strategy  Clinical staff conducted group or individual video education with verbal and written material and guidebook.  Patient learns about the [place] and its benefit of reducing the risk of disease. The [place] does not focus on calories. Instead, it emphasizes high-quality, nutrient-rich foods. By knowing the characteristics of the foods, we choose, we can determine their calorie density and make informed decisions.  Targeting Your Nutrition Priorities  Clinical staff conducted group or individual video education with verbal and written material and guidebook.  Patient learns that lifestyle habits have a tremendous impact on disease risk and progression. This video provides eating and physical activity recommendations based on your personal health goals, such as reducing LDL cholesterol, losing weight, preventing or controlling type 2 diabetes, and reducing high blood pressure.  Vitamins and Minerals  Clinical staff conducted group or individual video education with verbal and written material and guidebook.  Patient learns different ways to obtain key vitamins and minerals, including through a recommended healthy diet. It is important to discuss all supplements you take with your doctor.   Healthy Mind-Set    Smoking Cessation  Clinical staff conducted group or individual video education with verbal and written material and guidebook.  Patient learns that cigarette smoking and tobacco addiction pose a serious health risk which affects millions of people. Stopping smoking will significantly reduce the risk of heart disease, lung disease, and many forms of cancer. Recommended strategies for quitting are covered, including working with your doctor to develop a successful plan.  Culinary   Becoming a Financial trader conducted group or individual video education with verbal and written  material and guidebook.  Patient learns that cooking at home can be healthy, cost-effective, quick, and puts them in control. Keys  to cooking healthy recipes will include looking at your recipe, assessing your equipment needs, planning ahead, making it simple, choosing cost-effective seasonal ingredients, and limiting the use of added fats, salts, and sugars.  Cooking - Breakfast and Snacks  Clinical staff conducted group or individual video education with verbal and written material and guidebook.  Patient learns how important breakfast is to satiety and nutrition through the entire day. Recommendations include key foods to eat during breakfast to help stabilize blood sugar levels and to prevent overeating at meals later in the day. Planning ahead is also a key component.  Cooking - Human resources officer conducted group or individual video education with verbal and written material and guidebook.  Patient learns eating strategies to improve overall health, including an approach to cook more at home. Recommendations include thinking of animal protein as a side on your plate rather than center stage and focusing instead on lower calorie dense options like vegetables, fruits, whole grains, and plant-based proteins, such as beans. Making sauces in large quantities to freeze for later and leaving the skin on your vegetables are also recommended to maximize your experience.  Cooking - Healthy Salads and Dressing Clinical staff conducted group or individual video education with verbal and written material and guidebook.  Patient learns that vegetables, fruits, whole grains, and legumes are the foundations of the [place]. Recommendations include how to incorporate each of these in flavorful and healthy salads, and how to create homemade salad dressings. Proper handling of ingredients is also covered. Cooking - Soups and [employer] - Soups and Desserts Clinical staff conducted  group or individual video education with verbal and written material and guidebook.  Patient learns that Pritikin soups and desserts make for easy, nutritious, and delicious snacks and meal components that are low in sodium, fat, sugar, and calorie density, while high in vitamins, minerals, and filling fiber. Recommendations include simple and healthy ideas for soups and desserts.   Overview     The Pritikin Solution Program Overview Clinical staff conducted group or individual video education with verbal and written material and guidebook.  Patient learns that the results of the [place] Program have been documented in more than 100 articles published in peer-reviewed journals, and the benefits include reducing risk factors for (and, in some cases, even reversing) high cholesterol, high blood pressure, type 2 diabetes, obesity, and more! An overview of the three key pillars of the Pritikin Program will be covered: eating well, doing regular exercise, and having a healthy mind-set.  WORKSHOPS  Exercise: Exercise Basics: Building Your Action Plan Clinical staff led group instruction and group discussion with PowerPoint presentation and patient guidebook. To enhance the learning environment the use of posters, models and videos may be added. At the conclusion of this workshop, patients will comprehend the difference between physical activity and exercise, as well as the benefits of incorporating both, into their routine. Patients will understand the FITT (Frequency, Intensity, Time, and Type) principle and how to use it to build an exercise action plan. In addition, safety concerns and other considerations for exercise and cardiac rehab will be addressed by the presenter. The purpose of this lesson is to promote a comprehensive and effective weekly exercise routine in order to improve patients' overall level of fitness.   Managing Heart Disease: Your Path to a Healthier Heart Clinical staff led  group instruction and group discussion with PowerPoint presentation and patient guidebook. To enhance the learning environment the use of  posters, models and videos may be added.At the conclusion of this workshop, patients will understand the anatomy and physiology of the heart. Additionally, they will understand how Pritikin's three pillars impact the risk factors, the progression, and the management of heart disease.  The purpose of this lesson is to provide a high-level overview of the heart, heart disease, and how the Pritikin lifestyle positively impacts risk factors.  Exercise Biomechanics Clinical staff led group instruction and group discussion with PowerPoint presentation and patient guidebook. To enhance the learning environment the use of posters, models and videos may be added. Patients will learn how the structural parts of their bodies function and how these functions impact their daily activities, movement, and exercise. Patients will learn how to promote a neutral spine, learn how to manage pain, and identify ways to improve their physical movement in order to promote healthy living. The purpose of this lesson is to expose patients to common physical limitations that impact physical activity. Participants will learn practical ways to adapt and manage aches and pains, and to minimize their effect on regular exercise. Patients will learn how to maintain good posture while sitting, walking, and lifting.  Balance Training and Fall Prevention  Clinical staff led group instruction and group discussion with PowerPoint presentation and patient guidebook. To enhance the learning environment the use of posters, models and videos may be added. At the conclusion of this workshop, patients will understand the importance of their sensorimotor skills (vision, proprioception, and the vestibular system) in maintaining their ability to balance as they age. Patients will apply a variety of balancing  exercises that are appropriate for their current level of function. Patients will understand the common causes for poor balance, possible solutions to these problems, and ways to modify their physical environment in order to minimize their fall risk. The purpose of this lesson is to teach patients about the importance of maintaining balance as they age and ways to minimize their risk of falling.  WORKSHOPS   Nutrition:  Fueling a Scientist, research (physical sciences) led group instruction and group discussion with PowerPoint presentation and patient guidebook. To enhance the learning environment the use of posters, models and videos may be added. Patients will review the foundational principles of the [place] and understand what constitutes a serving size in each of the food groups. Patients will also learn Prit[facility]in-friendly foods that are better choices when away from home and review make-ahead meal and snack options. Calorie density will be reviewed and applied to three nutrition priorities: weight maintenance, weight loss, and weight gain. The purpose of this lesson is to reinforce (in a group setting) the key concepts around what patients are recommended to eat and how to apply these guidelines when away from home by planning and selecting Pritikin-friendly options. Patients will understand how calorie density may be adjusted for different weight management goals.  Mindful Eating  Clinical staff led group instruction and group discussion with PowerPoint presentation and patient guidebook. To enhance the learning environment the use of posters, models and videos may be added. Patients will briefly review the concepts of the [place] and the importance of low-calorie dense foods. The concept of mindful eating will be introduced as well as the importance of paying attention to internal hunger signals. Triggers for non-hunger eating and techniques for dealing with triggers will be explored.  The purpose of this lesson is to provide patients with the opportunity to review the basic principles of the [place], discuss  the value of eating mindfully and how to measure internal cues of hunger and fullness using the Hunger Scale. Patients will also discuss reasons for non-hunger eating and learn strategies to use for controlling emotional eating.  Targeting Your Nutrition Priorities Clinical staff led group instruction and group discussion with PowerPoint presentation and patient guidebook. To enhance the learning environment the use of posters, models and videos may be added. Patients will learn how to determine their genetic susceptibility to disease by reviewing their family history. Patients will gain insight into the importance of diet as part of an overall healthy lifestyle in mitigating the impact of genetics and other environmental insults. The purpose of this lesson is to provide patients with the opportunity to assess their personal nutrition priorities by looking at their family history, their own health history and current risk factors. Patients will also be able to discuss ways of prioritizing and modifying the [place] for their highest risk areas  Menu  Clinical staff led group instruction and group discussion with PowerPoint presentation and patient guidebook. To enhance the learning environment the use of posters, models and videos may be added. Using menus brought in from [employer], or printed from [employer], patients will apply the [place] dining out guidelines that were presented in the [employer] video. Patients will also be able to practice these guidelines in a variety of provided scenarios. The purpose of this lesson is to provide patients with the opportunity to practice hands-on learning of the [place] with actual menus and practice scenarios.  Label Reading Clinical staff led group instruction  and group discussion with PowerPoint presentation and patient guidebook. To enhance the learning environment the use of posters, models and videos may be added. Patients will review and discuss the Pritikin label reading guidelines presented in [employer]'s Label Reading Educational series video. Using fool labels brought in from local grocery stores and markets, patients will apply the label reading guidelines and determine if the packaged food meet the Pritikin guidelines. The purpose of this lesson is to provide patients with the opportunity to review, discuss, and practice hands-on learning of the [facility]rit[facility] Label Reading guidelines with actual packaged food labels. [place] Workshops are designed to teach patients ways to prepare quick, simple, and affordable recipes at home. The importance of nutrition's role in chronic disease risk reduction is reflected in its emphasis in the overall [facility]rit[facility]in program. By learning how to prepare essential core Pritikin Eating Plan recipes, patients will increase control over what they eat; be able to customize the flavor of foods without the use of added salt, sugar, or fat; and improve the quality of the food they consume. By learning a set of core recipes which are easily assembled, quickly prepared, and affordable, patients are more likely to prepare more healthy foods at home. These workshops focus on convenient breakfasts, simple entres, side dishes, and desserts which can be prepared with minimal effort and are consistent with nutrition recommendations for cardiovascular risk reduction. Cooking [employer] are taught by a Engineer, materials (RD) who has been trained by the [employer]. The chef or RD has a clear understanding of the importance of minimizing - if not completely eliminating - added fat, sugar, and sodium in recipes. Throughout the series of [place] Workshop sessions, patients will learn  about healthy ingredients and efficient methods of cooking to build confidence in their capability to prepare  Cooking School weekly topics:  Adding Flavor- Sodium-Free  Fast and Healthy Breakfasts  Powerhouse Plant-Based Proteins  Satisfying Salads and Dressings  Simple Sides and Sauces  International Cuisine-Spotlight on the [place] Zones  Delicious Desserts  Savory Soups  Efficiency Cooking - Meals in a Snap  Tasty Appetizers and Snacks  Comforting Weekend Breakfasts  One-Pot Wonders   Fast Evening Meals  Easy Entertaining  Personalizing Your Pritikin Plate  WORKSHOPS   Healthy Mindset (Psychosocial): New Thoughts, New Behaviors Clinical staff led group instruction and group discussion with PowerPoint presentation and patient guidebook. To enhance the learning environment the use of posters, models and videos may be added. Patients will learn and practice techniques for developing effective health and lifestyle goals. Patients will be able to effectively apply the goal setting process learned to develop at least one new personal goal.  The purpose of this lesson is to expose patients to a new skill set of behavior modification techniques such as techniques setting SMART goals, overcoming barriers, and achieving new thoughts and new behaviors.  Managing Moods and Relationships Clinical staff led group instruction and group discussion with PowerPoint presentation and patient guidebook. To enhance the learning environment the use of posters, models and videos may be added. Patients will learn how emotional and chronic stress factors can impact their health and relationships. They will learn healthy ways to manage their moods and utilize positive coping mechanisms. In addition, ICR patients will learn ways to improve communication skills. The purpose of this lesson is to expose patients to ways of understanding how one's mood and health are intimately connected. Developing a healthy outlook  can help build positive relationships and connections with others. Patients will understand the importance of utilizing effective communication skills that include actively listening and being heard. They will learn and understand the importance of the "4 Cs" and especially Connections in fostering of a Healthy Mind-Set.  Healthy Sleep for a Healthy Heart Clinical staff led group instruction and group discussion with PowerPoint presentation and patient guidebook. To enhance the learning environment the use of posters, models and videos may be added. At the conclusion of this workshop, patients will be able to demonstrate knowledge of the importance of sleep to overall health, well-being, and quality of life. They will understand the symptoms of, and treatments for, common sleep disorders. Patients will also be able to identify daytime and nighttime behaviors which impact sleep, and they will be able to apply these tools to help manage sleep-related challenges. The purpose of this lesson is to provide patients with a general overview of sleep and outline the importance of quality sleep. Patients will learn about a few of the most common sleep disorders. Patients will also be introduced to the concept of "sleep hygiene," and discover ways to self-manage certain sleeping problems through simple daily behavior changes. Finally, the workshop will motivate patients by clarifying the links between quality sleep and their goals of heart-healthy living.   Recognizing and Reducing Stress Clinical staff led group instruction and group discussion with PowerPoint presentation and patient guidebook. To enhance the learning environment the use of posters, models and videos may be added. At the conclusion of this workshop, patients will be able to understand the types of stress reactions, differentiate between acute and chronic stress, and recognize the impact that chronic stress has on their health. They will also be able to  apply different coping mechanisms, such as reframing negative self-talk. Patients will have the opportunity to practice a variety of  stress management techniques, such as deep abdominal breathing, progressive muscle relaxation, and/or guided imagery.  The purpose of this lesson is to educate patients on the role of stress in their lives and to provide healthy techniques for coping with it.  Learning Barriers/Preferences:  Learning Barriers/Preferences - 07/27/22 1509       Learning Barriers/Preferences   Learning Barriers Sight   pt wears glasses   Learning Preferences Computer/Internet;Group Instruction;Individual Instruction;Skilled Demonstration;Verbal Instruction             Education Topics:  Knowledge Questionnaire Score:  Knowledge Questionnaire Score - 07/27/22 1017       Knowledge Questionnaire Score   Pre Score 20/24             Core Components/Risk Factors/Patient Goals at Admission:  Personal Goals and Risk Factors at Admission - 07/27/22 1510       Core Components/Risk Factors/Patient Goals on Admission    Weight Management Yes;Weight Maintenance    Intervention Weight Management: Develop a combined nutrition and exercise program designed to reach desired caloric intake, while maintaining appropriate intake of nutrient and fiber, sodium and fats, and appropriate energy expenditure required for the weight goal.;Weight Management: Provide education and appropriate resources to help participant work on and attain dietary goals.    Admit Weight 205 lb 14.6 oz (93.4 kg)    Diabetes Yes    Intervention Provide education about signs/symptoms and action to take for hypo/hyperglycemia.;Provide education about proper nutrition, including hydration, and aerobic/resistive exercise prescription along with prescribed medications to achieve blood glucose in normal ranges: Fasting glucose 65-99 mg/dL    Expected Outcomes Short Term: Participant verbalizes understanding of the  signs/symptoms and immediate care of hyper/hypoglycemia, proper foot care and importance of medication, aerobic/resistive exercise and nutrition plan for blood glucose control.;Long Term: Attainment of HbA1C < 7%.    Hypertension Yes    Intervention Provide education on lifestyle modifcations including regular physical activity/exercise, weight management, moderate sodium restriction and increased consumption of fresh fruit, vegetables, and low fat dairy, alcohol moderation, and smoking cessation.;Monitor prescription use compliance.    Expected Outcomes Short Term: Continued assessment and intervention until BP is < 140/38m HG in hypertensive participants. < 130/850mHG in hypertensive participants with diabetes, heart failure or chronic kidney disease.;Long Term: Maintenance of blood pressure at goal levels.    Lipids Yes    Intervention Provide education and support for participant on nutrition & aerobic/resistive exercise along with prescribed medications to achieve LDL '70mg'$ , HDL >'40mg'$ .    Expected Outcomes Short Term: Participant states understanding of desired cholesterol values and is compliant with medications prescribed. Participant is following exercise prescription and nutrition guidelines.;Long Term: Cholesterol controlled with medications as prescribed, with individualized exercise RX and with personalized nutrition plan. Value goals: LDL < '70mg'$ , HDL > 40 mg.    Stress Yes    Intervention Offer individual and/or small group education and counseling on adjustment to heart disease, stress management and health-related lifestyle change. Teach and support self-help strategies.;Refer participants experiencing significant psychosocial distress to appropriate mental health specialists for further evaluation and treatment. When possible, include family members and significant others in education/counseling sessions.    Expected Outcomes Short Term: Participant demonstrates changes in health-related  behavior, relaxation and other stress management skills, ability to obtain effective social support, and compliance with psychotropic medications if prescribed.;Long Term: Emotional wellbeing is indicated by absence of clinically significant psychosocial distress or social isolation.  Core Components/Risk Factors/Patient Goals Review:   Goals and Risk Factor Review     Row Name 07/31/22 1126 08/22/22 1629           Core Components/Risk Factors/Patient Goals Review   Personal Goals Review Weight Management/Obesity;Hypertension;Lipids;Diabetes Weight Management/Obesity;Hypertension;Lipids;Diabetes      Review Tom started intensive cardiac rehab on 07/31/22 and did well with exercise. Vital signs and CBG's were stable. Garrett Jordan has been doing well with exercise at  intensive cardiac rehab. Vital signs and CBG's have been  stable.      Expected Outcomes Garrett Jordan will continue to participate in intensive cardiac rehab for exercise, Garrett Jordan will continue to participate in intensive cardiac rehab for exercise, nutrition and lifestyle modifications               Core Components/Risk Factors/Patient Goals at Discharge (Final Review):   Goals and Risk Factor Review - 08/22/22 1629       Core Components/Risk Factors/Patient Goals Review   Personal Goals Review Weight Management/Obesity;Hypertension;Lipids;Diabetes    Review Garrett Jordan has been doing well with exercise at  intensive cardiac rehab. Vital signs and CBG's have been  stable.    Expected Outcomes Garrett Jordan will continue to participate in intensive cardiac rehab for exercise, nutrition and lifestyle modifications             ITP Comments:  ITP Comments     Row Name 07/27/22 1037 07/31/22 1120 08/22/22 1627       ITP Comments 30 day ITP Review. Introduction to [facility] Education Program/ Intensive Cardiac Rehab. Initial Orientation Packet Reviewed with the patient 30 Day ITP Review. Tom started intensve cardiac rehab on 07/31/22 and did  well with exercise. 30 Day ITP Review. Garrett Jordan has good attendance and participation in  intensve cardiac rehab              Comments: See ITP comments.Harrell Gave RN BSN

## 2022-08-23 ENCOUNTER — Encounter (HOSPITAL_COMMUNITY)
Admission: RE | Admit: 2022-08-23 | Discharge: 2022-08-23 | Disposition: A | Discharge status: Home / Self Care | Payer: Medicare Other | Source: Ambulatory Visit | Attending: Cardiology | Admitting: Cardiology

## 2022-08-23 DIAGNOSIS — I252 Old myocardial infarction: Secondary | ICD-10-CM | POA: Diagnosis not present

## 2022-08-23 DIAGNOSIS — Z9862 Peripheral vascular angioplasty status: Secondary | ICD-10-CM | POA: Diagnosis not present

## 2022-08-23 DIAGNOSIS — Z5189 Encounter for other specified aftercare: Secondary | ICD-10-CM | POA: Diagnosis not present

## 2022-08-23 DIAGNOSIS — I2129 ST elevation (STEMI) myocardial infarction involving other sites: Secondary | ICD-10-CM

## 2022-08-23 NOTE — Progress Notes (Signed)
Weight noted at 93.7 kg. Garrett Jordan said that his nephrologist discontinued his HCTZ and changed his olmesartan he thinks to 40 mg once a day. Garrett Jordan will bring the new dose on Friday to update medication list.

## 2022-08-25 ENCOUNTER — Encounter (HOSPITAL_COMMUNITY)
Admission: RE | Admit: 2022-08-25 | Discharge: 2022-08-25 | Disposition: A | Discharge status: Home / Self Care | Payer: Medicare Other | Source: Ambulatory Visit | Attending: Cardiology | Admitting: Cardiology

## 2022-08-25 DIAGNOSIS — I252 Old myocardial infarction: Secondary | ICD-10-CM | POA: Diagnosis not present

## 2022-08-25 DIAGNOSIS — Z9862 Peripheral vascular angioplasty status: Secondary | ICD-10-CM | POA: Diagnosis not present

## 2022-08-25 DIAGNOSIS — Z5189 Encounter for other specified aftercare: Secondary | ICD-10-CM | POA: Diagnosis not present

## 2022-08-25 DIAGNOSIS — I2129 ST elevation (STEMI) myocardial infarction involving other sites: Secondary | ICD-10-CM

## 2022-08-25 NOTE — Progress Notes (Signed)
Garrett Jordan says the olmesartan dose is 20 mg will update medication list.Garrett Jordan [name] RN BSN

## 2022-08-28 ENCOUNTER — Encounter (HOSPITAL_COMMUNITY)
Admission: RE | Admit: 2022-08-28 | Discharge: 2022-08-28 | Disposition: A | Discharge status: Home / Self Care | Payer: Medicare Other | Source: Ambulatory Visit | Attending: Cardiology | Admitting: Cardiology

## 2022-08-28 DIAGNOSIS — I2129 ST elevation (STEMI) myocardial infarction involving other sites: Secondary | ICD-10-CM

## 2022-08-28 DIAGNOSIS — I252 Old myocardial infarction: Secondary | ICD-10-CM | POA: Diagnosis not present

## 2022-08-28 DIAGNOSIS — Z5189 Encounter for other specified aftercare: Secondary | ICD-10-CM | POA: Diagnosis not present

## 2022-08-28 DIAGNOSIS — Z9862 Peripheral vascular angioplasty status: Secondary | ICD-10-CM | POA: Diagnosis not present

## 2022-08-30 ENCOUNTER — Encounter (HOSPITAL_COMMUNITY)
Admission: RE | Admit: 2022-08-30 | Discharge: 2022-08-30 | Disposition: A | Discharge status: Home / Self Care | Payer: Medicare Other | Source: Ambulatory Visit | Attending: Cardiology | Admitting: Cardiology

## 2022-08-30 DIAGNOSIS — Z5189 Encounter for other specified aftercare: Secondary | ICD-10-CM | POA: Diagnosis not present

## 2022-08-30 DIAGNOSIS — I252 Old myocardial infarction: Secondary | ICD-10-CM | POA: Diagnosis not present

## 2022-08-30 DIAGNOSIS — Z9862 Peripheral vascular angioplasty status: Secondary | ICD-10-CM | POA: Diagnosis not present

## 2022-08-30 DIAGNOSIS — I2129 ST elevation (STEMI) myocardial infarction involving other sites: Secondary | ICD-10-CM

## 2022-08-31 ENCOUNTER — Ambulatory Visit
Admission: RE | Admit: 2022-08-31 | Discharge: 2022-08-31 | Disposition: A | Discharge status: Home / Self Care | Payer: Medicare Other | Source: Ambulatory Visit | Attending: Interventional Radiology | Admitting: Interventional Radiology

## 2022-08-31 ENCOUNTER — Encounter: Payer: Self-pay | Admitting: *Deleted

## 2022-08-31 DIAGNOSIS — I9789 Other postprocedural complications and disorders of the circulatory system, not elsewhere classified: Secondary | ICD-10-CM

## 2022-08-31 DIAGNOSIS — I714 Abdominal aortic aneurysm, without rupture, unspecified: Secondary | ICD-10-CM

## 2022-08-31 DIAGNOSIS — Z9889 Other specified postprocedural states: Secondary | ICD-10-CM | POA: Diagnosis not present

## 2022-08-31 HISTORY — PX: IR RADIOLOGIST EVAL & MGMT: IMG5224

## 2022-08-31 NOTE — Progress Notes (Signed)
Chief Complaint: Patient was consulted remotely today (TeleHealth) for AAA with type 2 endoleak now repaired at the request of Symiah Nowotny K.    Referring Physician(s): Dr. Theotis Burrow  History of Present Illness: Garrett Jordan. is a 81 y.o. male with a history of abdominal aortic aneurysm status post endovascular aortic repair with bifurcated stent graft in September 2013. 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   He underwent endovascular endoleak repair with coil embolization of the flow channel in the aneurysm sac in the origin of the inferior mesenteric artery on 07/16/2015. He presents today for follow-up.   Garrett Jordan is doing very well.     CTA on 08/19/20 demonstrates cessation of endoleak and stable size of aneurysm sac.    CTA 08/12/21: No visible endoleak.  Aneurysm sac remains stable 6.4 x 4.7 cm.    CTA 08/22/22: Endovascular repair of the abdominal aortic aneurysm. The aortic aneurysm sac is stable in size and there is no evidence for an endoleak. Changes compatible with previous type 2 endoleak repair.  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 Decreased mural thrombus involving the saccular aneurysm as described.   He denies significant changes in bowel or bladder habits. No chest pain, shortness of breath or other systemic symptoms.  Past Medical History:  Diagnosis Date   AAA (abdominal aortic aneurysm) (HCC)    Arthritis    knees   Atrial fibrillation (HCC)    CAD (coronary artery disease)    Cancer (HCC)    prostate   CKD (chronic kidney disease), stage III ([place]) 06/18/2022   COVID    06/2019 and in 2022 states they were mild cases   Diabetes mellitus without complication ([place])    Dysrhythmia    A-fib pt is on Eliquis   GERD (gastroesophageal reflux disease)    Gout     History of kidney stones    Hyperlipidemia    Hypertension    Hypothyroidism    Myocardial infarction ([place]) 01/1991   sp inferior   Nerve compression    right leg   Reflux    Seasonal allergies    Thoracic ascending aortic aneurysm ([place])    Thyroid disease    hypothyroidism    Past Surgical History:  Procedure Laterality Date   ABDOMINAL AORTAGRAM N/A 02/28/2012   Procedure: ABDOMINAL Maxcine Ham;  Surgeon: Serafina Mitchell, MD;  Location: [facility] CATH LAB;  Service: Cardiovascular;  Laterality: N/A;   abdominal aortagram embolization  02/28/2012   ABDOMINAL AORTIC ANEURYSM REPAIR  07/14/2012   CARDIAC CATHETERIZATION     CORONARY ARTERY BYPASS GRAFT  04/13/2009   CORONARY BALLOON ANGIOPLASTY N/A 06/15/2022   Procedure: CORONARY BALLOON ANGIOPLASTY;  Surgeon: Sherren Mocha, MD;  Location: [place] CV LAB;  Service: Cardiovascular;  Laterality: N/A;   CORONARY STENT PLACEMENT  1992 and  2009   RCA   EMBOLIZATION Right 02/28/2012   Procedure: EMBOLIZATION;  Surgeon: Serafina Mitchell, MD;  Location: [facility] CATH LAB;  Service: Cardiovascular;  Laterality: Right;   IR GENERIC HISTORICAL  12/05/2016   IR RADIOLOGIST EVAL & MGMT 12/05/2016 Jacqulynn Cadet, MD [facility] INTERV RAD   IR RADIOLOGIST EVAL & MGMT  07/10/2019   IR RADIOLOGIST EVAL & MGMT  08/25/2020   IR RADIOLOGIST EVAL & MGMT  08/16/2021   KIDNEY STONE SURGERY  LEFT HEART CATH AND CORONARY ANGIOGRAPHY N/A 06/15/2022   Procedure: LEFT HEART CATH AND CORONARY ANGIOGRAPHY;  Surgeon: Sherren Mocha, MD;  Location: [place] CV LAB;  Service: Cardiovascular;  Laterality: N/A;   LEFT HEART CATHETERIZATION WITH CORONARY/GRAFT ANGIOGRAM N/A 01/08/2015   Procedure: LEFT HEART CATHETERIZATION WITH Beatrix Fetters;  Surgeon: Jacolyn Reedy, MD;  Location: [facility] CATH LAB;  Service: Cardiovascular;  Laterality: N/A;   parathyroid adenoma     PROSTATE SURGERY     rad seeds   SPINE SURGERY     TONSILLECTOMY       Allergies: Ace inhibitors, Crestor [rosuvastatin], Nitroglycerin, and Quinolones  Medications: Prior to Admission medications   Medication Sig Start Date End Date Taking? Authorizing Provider  ACCU-CHEK GUIDE test strip USE TWICE DAILY 05/29/22   Shamleffer, Melanie Crazier, MD  acetaminophen (TYLENOL) 650 MG CR tablet Take 650 mg by mouth every 8 (eight) hours as needed for pain.    [provider]  allopurinol (ZYLOPRIM) 300 MG tablet Take 300 mg by mouth every other day. Take in the evening - on even days of the month    [provider]  atorvastatin (LIPITOR) 40 MG tablet Take 1 tablet (40 mg total) by mouth daily. 06/18/22   Barrett, Evelene Croon, PA-C  Cholecalciferol (VITAMIN D3) 2000 UNITS TABS Take 2,000 Units by mouth daily.     [provider]  clonazePAM (KLONOPIN) 1 MG tablet Take 0.5 mg by mouth at bedtime.    [provider]  clopidogrel (PLAVIX) 75 MG tablet TAKE 1 TABLET BY MOUTH  DAILY 08/08/21   Jerline Pain, MD  Coenzyme Q10 300 MG CAPS Take 300 mg by mouth daily.    [provider]  Cyanocobalamin (VITAMIN B-12 PO) Take 2,500 mcg by mouth daily.    [provider]  ELIQUIS 5 MG TABS tablet TAKE 1 TABLET BY MOUTH TWICE A DAY 08/02/22   Jerline Pain, MD  ezetimibe (ZETIA) 10 MG tablet TAKE 1 TABLET BY MOUTH  DAILY 06/12/22   Early Osmond, MD  fenofibrate micronized (LOFIBRA) 200 MG capsule Take 1 capsule (200 mg total) by mouth daily before breakfast. 03/30/22   Shamleffer, Melanie Crazier, MD  Ferrous Sulfate Dried (HIGH POTENCY IRON) 65 MG TABS Take 65 mg by mouth daily.    [provider]  fluorouracil (EFUDEX) 5 % cream Apply 1 Application topically daily as needed (cancer spots). Use as directed    [provider]  fluticasone (FLONASE) 50 MCG/ACT nasal spray Place 2 sprays into both nostrils daily as needed for allergies.    [provider]  glipiZIDE (GLUCOTROL) 5 MG tablet Take 0.5  tablets (2.5 mg total) by mouth daily before breakfast AND 1 tablet (5 mg total) daily before supper. 03/30/22   Shamleffer, Melanie Crazier, MD  levothyroxine (SYNTHROID, LEVOTHROID) 175 MCG tablet Take 175 mcg by mouth daily before breakfast.    [provider]  lidocaine (ASPERCREME LIDOCAINE) 4 % Place 1 patch onto the skin daily as needed (pain).    [provider]  loratadine (CLARITIN) 10 MG tablet Take 10 mg by mouth at bedtime.    [provider]  magnesium oxide (MAG-OX) 400 (240 Mg) MG tablet Take 400 mg by mouth daily.    [provider]  metoprolol tartrate (LOPRESSOR) 25 MG tablet TAKE 1 TABLET BY MOUTH  TWICE DAILY 03/07/22   Jerline Pain, MD  Multiple Vitamins-Minerals (PRESERVISION AREDS 2) CAPS Take 1 capsule by  mouth 2 (two) times daily.    [provider]  nitroGLYCERIN (NITROSTAT) 0.4 MG SL tablet Place 1 tablet (0.4 mg total) under the tongue every 5 (five) minutes as needed for chest pain. 06/17/22   Barrett, Evelene Croon, PA-C  olmesartan (BENICAR) 20 MG tablet Take 20 mg by mouth daily. 08/23/22   [provider]  pantoprazole (PROTONIX) 40 MG tablet TAKE 1 TABLET BY MOUTH DAILY 08/07/22   Jerline Pain, MD  Polyethyl Glycol-Propyl Glycol (SYSTANE OP) Place 1 drop into both eyes daily as needed (dry/irritated eyes).    [provider]  Tamsulosin HCl (FLOMAX) 0.4 MG CAPS Take 0.4 mg by mouth every other day. Take in the evening - on even days of the month    [provider]  traMADol (ULTRAM) 50 MG tablet Take 50 mg by mouth as needed. 04/05/22   [provider]  triamcinolone cream (KENALOG) 0.1 % Apply 1 application  topically daily as needed (rash). 02/07/18   [provider]  TURMERIC PO Take 2 tablets by mouth in the morning and at bedtime.    [provider]  VASCEPA 1 g capsule TAKE 2 CAPSULES BY MOUTH 2 TIMES DAILY. 04/13/22   Jerline Pain, MD     Family History  Problem  Relation Age of Onset   Heart disease Father        Heart Disease before age 68   Heart attack Father    Hyperlipidemia Father    Hypertension Father    Stroke Mother    Deep vein thrombosis Mother    Heart disease Sister    Diabetes Sister    Hyperlipidemia Sister    Hypertension Sister    Heart attack Sister    Heart disease Paternal Uncle    Diabetes Maternal Grandmother    Diabetes Sister     Social History   Socioeconomic History   Marital status: Divorced    Spouse name: Not on file   Number of children: Not on file   Years of education: 14   Highest education level: Associate degree: academic program  Occupational History   Occupation: Retired  Tobacco Use   Smoking status: Former    Types: Cigarettes    Quit date: 08/01/1970    Years since quitting: 52.1   Smokeless tobacco: Never  Vaping Use   Vaping Use: Never used  Substance and Sexual Activity   Alcohol use: Yes    Alcohol/week: 6.0 standard drinks of alcohol    Types: 3 Glasses of wine, 3 Cans of beer per week    Comment: 1-3 glasses 3-4 times a week   Drug use: No   Sexual activity: Not Currently  Other Topics Concern   Not on file  Social History Narrative   Not on file   Social Determinants of Health   Financial Resource Strain: Not on file  Food Insecurity: Not on file  Transportation Needs: Not on file  Physical Activity: Not on file  Stress: Not on file  Social Connections: Not on file    Review of Systems  Review of Systems: A 12 point ROS discussed and pertinent positives are indicated in the HPI above.  All other systems are negative.  Advance Care Plan: The advanced care plan/surrogate decision maker was discussed at the time of visit and the patient did not wish to discuss or was not able to name a surrogate decision maker or provide an advance care plan.    Physical  Exam No direct physical exam was performed (except for noted visual exam findings with Video Visits).    Vital  Signs: There were no vitals taken for this visit.  Imaging: CTA CHEST/ABD/PEL DISSECTION W&/OR WO & GATING  Result Date: 08/22/2022 CLINICAL DATA:  Aneurysm of ascending aorta without rupture. Endovascular repair of abdominal aortic aneurysm. EXAM: CT ANGIOGRAPHY CHEST, ABDOMEN AND PELVIS TECHNIQUE: Non-contrast CT of the chest was initially obtained. Multidetector CT imaging through the chest, abdomen and pelvis was performed using the standard protocol during bolus administration of intravenous contrast. Multiplanar reconstructed images and MIPs were obtained and reviewed to evaluate the vascular anatomy. RADIATION DOSE REDUCTION: This exam was performed according to the departmental dose-optimization program which includes automated exposure control, adjustment of the mA and/or kV according to patient size and/or use of iterative reconstruction technique. CONTRAST:  125m OMNIPAQUE IOHEXOL 350 MG/ML SOLN COMPARISON:  Chest CT without contrast 03/08/2022 and CTA 08/12/2021 and chest CTA 03/03/2020 FINDINGS: CTA CHEST FINDINGS Cardiovascular: Native coronary arteries are heavily calcified. Post CABG procedure. Aortic root at the sinuses of Valsalva measure 4.2 cm. Fusiform aneurysm of the ascending thoracic aorta measures up to 4.3 cm and stable. Typical three-vessel arch anatomy. The great vessels are patent. No evidence for aortic dissection or intramural hematoma. Again noted is a small aneurysm or penetrating ulcer along the lateral aspect of the aortic isthmus on sequence 9 image 49 that measures 1.5 x 0.9 cm and minimally changed since 03/03/2020. Penetrating ulcer along the superior aspect of the distal aortic arch measures up to 1.0 cm on sequence 15, image 132 and not significantly changed since 2021. Proximal descending thoracic aorta measures 3.1 cm and stable. Stable distal descending thoracic aorta measures 2.6 cm. There are multiple coronary artery grafts. Aneurysmal dilatation and peripheral  thrombus involving a saphenous vein graft extending into the left circumflex circulation and concern for high-grade stenosis in this area on sequence 9 image 82. There is also aneurysm dilatation and at least mild stenosis involving a saphenous vein graft supplying the right coronary circulation on series 9, image 93. No large filling defects in the pulmonary arteries. Mediastinum/Nodes: No lymph node enlargement in the mediastinum or hila. Esophagus is unremarkable. No enlarged axillary lymph nodes. Lungs/Pleura: Trachea and mainstem bronchi are patent. Stable subpleural 5 mm nodule in the right lower lobe on sequence 11 image 14 and unchanged since 03/03/2020. No airspace disease or consolidation in lungs. No pleural effusions. Stable densities in left lower lobe are suggestive for mild scarring. Musculoskeletal: No acute bone abnormality. Review of the MIP images confirms the above findings. CTA ABDOMEN AND PELVIS FINDINGS VASCULAR Aorta: Endovascular repair of the abdominal aortic aneurysm with a bifurcated aortic stent graft. The main body and graft limbs are patent. Stent graft is positioned just below the renal arteries. The abdominal aortic aneurysm sac measures 6.4 x 4.5 cm and measured 6.4 x 4.7 cm on 08/12/2021. Again noted are embolization coils within the aneurysm sac. No evidence for an endoleak. Celiac: Patent without evidence of aneurysm, dissection, vasculitis or significant stenosis. SMA: Patent without evidence of aneurysm, dissection, vasculitis or significant stenosis. Renals: Both renal arteries are patent without evidence of aneurysm, dissection, vasculitis, fibromuscular dysplasia or significant stenosis. IMA: Origin of the IMA has been embolized with coils. IMA distal to the embolization coils are patent. Inflow: Stent grafts extend into the right external iliac artery. Coil embolization of the right internal iliac artery. Probable thrombosed aneurysm involving the proximal right internal  iliac arteries  measures 1.4 cm and stable. Left graft terminates in distal left common iliac artery. Distal left common iliac artery measures 1.6 cm and stable. Again noted is a saccular aneurysm involving the left internal iliac artery just beyond the origin. The saccular aneurysm measures up to 1.9 cm and minimally changed in size. However, there is decreased thrombus within this saccular aneurysm. Left internal and external iliac arteries are patent. Proximal femoral arteries bilaterally. Veins: Main portal vein and portal vein confluence are patent. IVC and renal veins are patent. No gross abnormality to the iliac veins. Review of the MIP images confirms the above findings. NON-VASCULAR Hepatobiliary: Gallbladder is decompressed but appears to be stone filled. No biliary dilatation. No suspicious hepatic lesion. Probable hepatic steatosis. Pancreas: Unremarkable. No pancreatic ductal dilatation or surrounding inflammatory changes. Spleen: Normal in size without focal abnormality. Adrenals/Urinary Tract: Minimal fullness and nodularity involving the left adrenal gland lateral limb is stable. Stable appearance of the right adrenal gland. 7 mm stone in the right kidney interpolar region. At least 2 stones in the left kidney largest measuring 5 mm in the upper pole region. Evidence for small renal cysts that not require dedicated follow-up. No suspicious renal lesions. Normal appearance of the urinary bladder. Mild fullness in left renal pelvis appears chronic. Stomach/Bowel: Mild dilatation of the appendix without inflammatory changes. No evidence for bowel obstruction. Normal appearance of stomach. Lymphatic: No lymph node enlargement in the abdomen or pelvis. 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 Other: Negative for free fluid.  Small periumbilical hernia fat. Musculoskeletal: No acute bone abnormality. Chronic disc space loss at L4-L5. Chronic anterolisthesis of L2 on L3 with disc and endplate  disease at K5-L9 and L3-L4. Prominent degenerative facet arthropathy at L2-L3. Review of the MIP images confirms the above findings. IMPRESSION: 1. 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 This recommendation follows 2010 ACCF/AHA/AATS/ACR/ASA/SCA/SCAI/SIR/STS/SVM Guidelines for the Diagnosis and Management of Patients with Thoracic Aortic Disease. Circulation. 2010; 121: J570-V779. Aortic aneurysm NOS (ICD10-I71.9) 2. 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 3. 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 The aortic aneurysm sac is stable in size and there is no evidence for an endoleak. Changes compatible with previous type 2 endoleak repair. 4. 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 Decreased mural thrombus involving the saccular aneurysm as described. 5. Evidence for stenoses involving coronary saphenous vein grafts. Recommend correlation with recent left heart catheterization from 06/15/2022. 6. No acute abnormality in the chest, abdomen or pelvis. 7. Incidental findings include cholelithiasis and nonobstructive bilateral nephrolithiasis. Electronically Signed   By: Markus Daft M.D.   On: 08/22/2022 17:59   DG Chest 1 View  Result Date: 08/09/2022 CLINICAL DATA:  Back pain EXAM: CHEST  1 VIEW COMPARISON:  06/15/2022 FINDINGS: Post sternotomy changes. No acute airspace disease or effusion. Normal cardiomediastinal silhouette with aortic atherosclerosis. No pneumothorax IMPRESSION: No active disease. Electronically Signed   By: Donavan Foil M.D.   On: 08/09/2022 16:04    Labs:  CBC: Recent Labs    06/13/22 0855 06/15/22 0755 06/16/22 0644 08/09/22 1539  WBC 5.5 5.9 6.2 5.6  HGB 16.6 16.1 14.6 15.2  HCT 48.4 45.6 40.4 43.0  PLT 166 159 135* 174    COAGS: Recent Labs    06/13/22 0855 06/15/22 0755   INR 1.2 1.2  APTT  --  27  BMP: Recent Labs    06/16/22 0644 06/17/22 0208 06/18/22 0808 06/30/22 1421 08/09/22 1539  NA 136 135 134* 140 138  K 3.7 4.0 4.5 4.1 4.3  CL 104 103 102 104 102  CO2 '23 23 23 '$ 17* 24  GLUCOSE 128* 140* 165* 85 155*  BUN 25* 30* 27* 21 27*  CALCIUM 10.1 10.6* 10.4* 10.2 10.7*  CREATININE 1.67* 2.14* 2.00* 1.41* 1.95*  GFRNONAA 41* 31* 33*  --  34*    LIVER FUNCTION TESTS: Recent Labs    06/15/22 0755 08/11/22 1057  BILITOT 0.9 0.7  AST 22 22  ALT 27 27  ALKPHOS 56 98  PROT 6.6 6.9  ALBUMIN 3.8 4.4    TUMOR MARKERS: No results for input(s): "AFPTM", "CEA", "CA199", "CHROMGRNA" in the last 8760 hours.  Assessment and Plan:  81 year-old male doing well status post endoleak repair in 2016.  AAA remains stable in size with no residual endoleak.  There is evidence of a slowly enlarging saccular aneurysm arising at the site of penetrating atherosclerotic ulceration from the proximal left hypogastric artery.  If this continues to enlarge, he may require repair.  I will keep a close eye on this.  1.)  Repeat CTA of the abdomen and pelvis and clinic visit in 1 year.     Electronically Signed: Criselda Peaches 08/31/2022, 10:59 AM   I spent a total of  15 Minutes in remote  clinical consultation, greater than 50% of which was counseling/coordinating care for type 2 endoleak.    Visit type: Audio only (telephone). Audio (no video) only due to patient preference. Alternative for in-person consultation at [facility], [address] [place], [place], [place]. This visit type was conducted due to national recommendations for restrictions regarding the COVID-19 Pandemic (e.g. social distancing).  This format is felt to be most appropriate for this patient at this time.  All issues noted in this document were discussed and addressed.

## 2022-09-01 ENCOUNTER — Encounter (HOSPITAL_COMMUNITY)
Admission: RE | Admit: 2022-09-01 | Discharge: 2022-09-01 | Disposition: A | Discharge status: Home / Self Care | Payer: Medicare Other | Source: Ambulatory Visit | Attending: Cardiology | Admitting: Cardiology

## 2022-09-01 DIAGNOSIS — I252 Old myocardial infarction: Secondary | ICD-10-CM | POA: Diagnosis not present

## 2022-09-01 DIAGNOSIS — I2129 ST elevation (STEMI) myocardial infarction involving other sites: Secondary | ICD-10-CM

## 2022-09-01 DIAGNOSIS — Z5189 Encounter for other specified aftercare: Secondary | ICD-10-CM | POA: Diagnosis not present

## 2022-09-01 DIAGNOSIS — Z9862 Peripheral vascular angioplasty status: Secondary | ICD-10-CM | POA: Diagnosis not present

## 2022-09-04 ENCOUNTER — Encounter (HOSPITAL_COMMUNITY)
Admission: RE | Admit: 2022-09-04 | Discharge: 2022-09-04 | Disposition: A | Discharge status: Home / Self Care | Payer: Medicare Other | Source: Ambulatory Visit | Attending: Cardiology | Admitting: Cardiology

## 2022-09-04 DIAGNOSIS — I2129 ST elevation (STEMI) myocardial infarction involving other sites: Secondary | ICD-10-CM

## 2022-09-04 DIAGNOSIS — Z9862 Peripheral vascular angioplasty status: Secondary | ICD-10-CM | POA: Diagnosis not present

## 2022-09-04 DIAGNOSIS — I252 Old myocardial infarction: Secondary | ICD-10-CM | POA: Diagnosis not present

## 2022-09-04 DIAGNOSIS — Z5189 Encounter for other specified aftercare: Secondary | ICD-10-CM | POA: Diagnosis not present

## 2022-09-06 ENCOUNTER — Ambulatory Visit: Payer: Medicare Other | Admitting: Surgery

## 2022-09-06 ENCOUNTER — Encounter: Payer: Self-pay | Admitting: Surgery

## 2022-09-06 ENCOUNTER — Other Ambulatory Visit: Payer: Medicare Other

## 2022-09-06 ENCOUNTER — Encounter (HOSPITAL_COMMUNITY)
Admission: RE | Admit: 2022-09-06 | Discharge: 2022-09-06 | Disposition: A | Discharge status: Home / Self Care | Payer: Medicare Other | Source: Ambulatory Visit | Attending: Cardiology | Admitting: Cardiology

## 2022-09-06 VITALS — BP 150/80 | HR 80 | Resp 20 | Ht 69.5 in | Wt 201.0 lb

## 2022-09-06 DIAGNOSIS — I2129 ST elevation (STEMI) myocardial infarction involving other sites: Secondary | ICD-10-CM

## 2022-09-06 DIAGNOSIS — Z9862 Peripheral vascular angioplasty status: Secondary | ICD-10-CM | POA: Diagnosis not present

## 2022-09-06 DIAGNOSIS — I252 Old myocardial infarction: Secondary | ICD-10-CM | POA: Diagnosis not present

## 2022-09-06 DIAGNOSIS — Z5189 Encounter for other specified aftercare: Secondary | ICD-10-CM | POA: Diagnosis not present

## 2022-09-06 DIAGNOSIS — I7121 Aneurysm of the ascending aorta, without rupture: Secondary | ICD-10-CM

## 2022-09-06 NOTE — Progress Notes (Signed)
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 Darcey Nora.  He also underwent EVAR for abdominal aortic aneurysm by Dr. Trula Slade.  He has history of diabetes, hypertension, hyperlipidemia, atrial fibrillation, stage III chronic kidney disease, and hypothyroidism.  He was scheduled for back surgery in August 2023 but was admitted the day before with chest pressure and ruled in for a STEMI.  Catheterization showed acute occlusion of the saphenous vein graft to the left posterolateral branch which could not be opened.  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  He was last seen by one of our PAs on 03/08/2022.  He denies any chest pain or pressure.  He denies any shortness of breath.  He is somewhat limited due to his chronic back pain.  He notes that his son had an aortic dissection repair a couple years ago at age 20 by Dr. Roxy Manns.  Current Outpatient Medications  Medication Sig Dispense Refill   ACCU-CHEK GUIDE test strip USE TWICE DAILY 200 strip 3   acetaminophen (TYLENOL) 650 MG CR tablet Take 650 mg by mouth every 8 (eight) hours as needed for pain.     allopurinol (ZYLOPRIM) 300 MG tablet Take 300 mg by mouth every other day. Take in the evening - on even days of the month     atorvastatin (LIPITOR) 40 MG tablet Take 1 tablet (40 mg total) by mouth daily. 30 tablet 11   Cholecalciferol (VITAMIN D3) 2000 UNITS TABS Take 2,000 Units by mouth daily.      clonazePAM (KLONOPIN) 1 MG tablet Take 0.5 mg by mouth at bedtime.     clopidogrel (PLAVIX) 75 MG tablet TAKE 1 TABLET BY MOUTH  DAILY 90 tablet 3   Coenzyme Q10 300 MG CAPS Take 300 mg by mouth daily.     Cyanocobalamin (VITAMIN B-12 PO) Take 2,500 mcg by mouth daily.     ELIQUIS 5 MG TABS tablet TAKE 1 TABLET BY MOUTH TWICE A DAY 180 tablet 1   ezetimibe (ZETIA) 10 MG tablet TAKE 1 TABLET BY  MOUTH  DAILY 90 tablet 3   fenofibrate micronized (LOFIBRA) 200 MG capsule Take 1 capsule (200 mg total) by mouth daily before breakfast. 90 capsule 3   fluorouracil (EFUDEX) 5 % cream Apply 1 Application topically daily as needed (cancer spots). Use as directed     fluticasone (FLONASE) 50 MCG/ACT nasal spray Place 2 sprays into both nostrils daily as needed for allergies.     glipiZIDE (GLUCOTROL) 5 MG tablet Take 0.5 tablets (2.5 mg total) by mouth daily before breakfast AND 1 tablet (5 mg total) daily before supper. 135 tablet 3   levothyroxine (SYNTHROID, LEVOTHROID) 175 MCG tablet Take 175 mcg by mouth daily before breakfast.     lidocaine (ASPERCREME LIDOCAINE) 4 % Place 1 patch onto the skin daily as needed (pain).     loratadine (CLARITIN) 10 MG tablet Take 10 mg by mouth at bedtime.     magnesium oxide (MAG-OX) 400 (240 Mg) MG tablet Take 400 mg by mouth daily.     metoprolol tartrate (LOPRESSOR) 25 MG tablet TAKE 1 TABLET BY MOUTH  TWICE DAILY 180 tablet 3   Multiple Vitamins-Minerals (PRESERVISION AREDS 2) CAPS Take 1 capsule by mouth 2 (two) times daily.     nitroGLYCERIN (NITROSTAT) 0.4 MG SL tablet Place 1 tablet (  0.4 mg total) under the tongue every 5 (five) minutes as needed for chest pain. 25 tablet 3   olmesartan (BENICAR) 20 MG tablet Take 20 mg by mouth daily.     pantoprazole (PROTONIX) 40 MG tablet TAKE 1 TABLET BY MOUTH DAILY 90 tablet 3   Polyethyl Glycol-Propyl Glycol (SYSTANE OP) Place 1 drop into both eyes daily as needed (dry/irritated eyes).     Tamsulosin HCl (FLOMAX) 0.4 MG CAPS Take 0.4 mg by mouth every other day. Take in the evening - on even days of the month     traMADol (ULTRAM) 50 MG tablet Take 50 mg by mouth as needed.     triamcinolone cream (KENALOG) 0.1 % Apply 1 application  topically daily as needed (rash).  2   TURMERIC PO Take 2 tablets by mouth in the morning and at bedtime.     VASCEPA 1 g capsule TAKE 2 CAPSULES BY MOUTH 2 TIMES DAILY. 360 capsule  3   Ferrous Sulfate Dried (HIGH POTENCY IRON) 65 MG TABS Take 65 mg by mouth daily.     No current facility-administered medications for this visit.     Physical Exam: BP (!) 150/80   Pulse 80   Resp 20   Ht 5' 9.5" (1.765 m)   Wt 201 lb (91.2 kg)   SpO2 94% Comment: RA  BMI 29.26 kg/m  He looks well. Cardiac exam shows a regular rate and rhythm with normal heart sounds.  There is no murmur. Lungs are clear.  Diagnostic Tests:  Narrative & Impression  CLINICAL DATA:  Aneurysm of ascending aorta without rupture. Endovascular repair of abdominal aortic aneurysm.   EXAM: CT ANGIOGRAPHY CHEST, ABDOMEN AND PELVIS   TECHNIQUE: Non-contrast CT of the chest was initially obtained.   Multidetector CT imaging through the chest, abdomen and pelvis was performed using the standard protocol during bolus administration of intravenous contrast. Multiplanar reconstructed images and MIPs were obtained and reviewed to evaluate the vascular anatomy.   RADIATION DOSE REDUCTION: This exam was performed according to the departmental dose-optimization program which includes automated exposure control, adjustment of the mA and/or kV according to patient size and/or use of iterative reconstruction technique.   CONTRAST:  133m OMNIPAQUE IOHEXOL 350 MG/ML SOLN   COMPARISON:  Chest CT without contrast 03/08/2022 and CTA 08/12/2021 and chest CTA 03/03/2020   FINDINGS: CTA CHEST FINDINGS   Cardiovascular: Native coronary arteries are heavily calcified. Post CABG procedure. Aortic root at the sinuses of Valsalva measure 4.2 cm. Fusiform aneurysm of the ascending thoracic aorta measures up to 4.3 cm and stable. Typical three-vessel arch anatomy. The great vessels are patent. No evidence for aortic dissection or intramural hematoma. Again noted is a small aneurysm or penetrating ulcer along the lateral aspect of the aortic isthmus on sequence 9 image 49 that measures 1.5 x 0.9 cm and  minimally changed since 03/03/2020. Penetrating ulcer along the superior aspect of the distal aortic arch measures up to 1.0 cm on sequence 15, image 132 and not significantly changed since 2021. Proximal descending thoracic aorta measures 3.1 cm and stable. Stable distal descending thoracic aorta measures 2.6 cm. There are multiple coronary artery grafts. Aneurysmal dilatation and peripheral thrombus involving a saphenous vein graft extending into the left circumflex circulation and concern for high-grade stenosis in this area on sequence 9 image 82. There is also aneurysm dilatation and at least mild stenosis involving a saphenous vein graft supplying the right coronary circulation on series 9, image 93. No  large filling defects in the pulmonary arteries.   Mediastinum/Nodes: No lymph node enlargement in the mediastinum or hila. Esophagus is unremarkable. No enlarged axillary lymph nodes.   Lungs/Pleura: Trachea and mainstem bronchi are patent. Stable subpleural 5 mm nodule in the right lower lobe on sequence 11 image 14 and unchanged since 03/03/2020. No airspace disease or consolidation in lungs. No pleural effusions. Stable densities in left lower lobe are suggestive for mild scarring.   Musculoskeletal: No acute bone abnormality.   Review of the MIP images confirms the above findings.   CTA ABDOMEN AND PELVIS FINDINGS   VASCULAR   Aorta: Endovascular repair of the abdominal aortic aneurysm with a bifurcated aortic stent graft. The main body and graft limbs are patent. Stent graft is positioned just below the renal arteries. The abdominal aortic aneurysm sac measures 6.4 x 4.5 cm and measured 6.4 x 4.7 cm on 08/12/2021. Again noted are embolization coils within the aneurysm sac. No evidence for an endoleak.   Celiac: Patent without evidence of aneurysm, dissection, vasculitis or significant stenosis.   SMA: Patent without evidence of aneurysm, dissection, vasculitis  or significant stenosis.   Renals: Both renal arteries are patent without evidence of aneurysm, dissection, vasculitis, fibromuscular dysplasia or significant stenosis.   IMA: Origin of the IMA has been embolized with coils. IMA distal to the embolization coils are patent.   Inflow: Stent grafts extend into the right external iliac artery. Coil embolization of the right internal iliac artery. Probable thrombosed aneurysm involving the proximal right internal iliac arteries measures 1.4 cm and stable. Left graft terminates in distal left common iliac artery. Distal left common iliac artery measures 1.6 cm and stable. Again noted is a saccular aneurysm involving the left internal iliac artery just beyond the origin. The saccular aneurysm measures up to 1.9 cm and minimally changed in size. However, there is decreased thrombus within this saccular aneurysm. Left internal and external iliac arteries are patent. Proximal femoral arteries bilaterally.   Veins: Main portal vein and portal vein confluence are patent. IVC and renal veins are patent. No gross abnormality to the iliac veins.   Review of the MIP images confirms the above findings.   NON-VASCULAR   Hepatobiliary: Gallbladder is decompressed but appears to be stone filled. No biliary dilatation. No suspicious hepatic lesion. Probable hepatic steatosis.   Pancreas: Unremarkable. No pancreatic ductal dilatation or surrounding inflammatory changes.   Spleen: Normal in size without focal abnormality.   Adrenals/Urinary Tract: Minimal fullness and nodularity involving the left adrenal gland lateral limb is stable. Stable appearance of the right adrenal gland. 7 mm stone in the right kidney interpolar region. At least 2 stones in the left kidney largest measuring 5 mm in the upper pole region. Evidence for small renal cysts that not require dedicated follow-up. No suspicious renal lesions. Normal appearance of the urinary  bladder. Mild fullness in left renal pelvis appears chronic.   Stomach/Bowel: Mild dilatation of the appendix without inflammatory changes. No evidence for bowel obstruction. Normal appearance of stomach.   Lymphatic: No lymph node enlargement in the abdomen or pelvis.   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   Other: Negative for free fluid.  Small periumbilical hernia fat.   Musculoskeletal: No acute bone abnormality. Chronic disc space loss at L4-L5. Chronic anterolisthesis of L2 on L3 with disc and endplate disease at X1-G6 and L3-L4. Prominent degenerative facet arthropathy at L2-L3.   Review of the MIP images confirms the above findings.   IMPRESSION:  1. 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 This recommendation follows 2010 ACCF/AHA/AATS/ACR/ASA/SCA/SCAI/SIR/STS/SVM Guidelines for the Diagnosis and Management of Patients with Thoracic Aortic Disease. Circulation. 2010; 121: O756-E332. Aortic aneurysm NOS (ICD10-I71.9) 2. 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 3. 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 The aortic aneurysm sac is stable in size and there is no evidence for an endoleak. Changes compatible with previous type 2 endoleak repair. 4. 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 Decreased mural thrombus involving the saccular aneurysm as described. 5. Evidence for stenoses involving coronary saphenous vein grafts. Recommend correlation with recent left heart catheterization from 06/15/2022. 6. No acute abnormality in the chest, abdomen or pelvis. 7. Incidental findings include cholelithiasis and nonobstructive bilateral nephrolithiasis.     Electronically Signed   By: Markus Daft M.D.   On: 08/22/2022 17:59      Impression:  He has a stable  small fusiform ascending aortic aneurysm measuring 4.3 cm in diameter which is well below the surgical threshold of 5.5 cm.  He also has stable small penetrating ulcers of the distal aortic arch and aortic isthmus region.  These have not significantly changed dating back to 2021.  I do not think there is currently any indication for surgical treatment of these lesions.  I do not think he would be a candidate for redo sternotomy for aortic arch debranching and TEVAR given his advanced age, coronary disease, stage III chronic kidney disease, diffuse vascular disease and other comorbidities as noted above.  I reviewed the CTA images with him and answered all of his questions.  I stressed the importance of continued good blood pressure control in preventing further enlargement and acute aortic dissection.  I advised him against doing any heavy lifting that may require a Valsalva maneuver and could suddenly raise his blood pressure to high levels.  Plan:  He will return to see me in 1 year with a CTA of the chest, abdomen, and pelvis for aortic surveillance.  I spent 20 minutes performing this established patient evaluation and > 50% of this time was spent face to face counseling and coordinating the care of this patient's aortic aneurysm.    Gaye Pollack, MD [facility] [phone]

## 2022-09-08 ENCOUNTER — Encounter (HOSPITAL_COMMUNITY): Payer: Medicare Other

## 2022-09-11 ENCOUNTER — Encounter (HOSPITAL_COMMUNITY): Payer: Medicare Other

## 2022-09-12 ENCOUNTER — Other Ambulatory Visit: Payer: Self-pay | Admitting: Cardiology

## 2022-09-13 ENCOUNTER — Encounter (HOSPITAL_COMMUNITY)
Admission: RE | Admit: 2022-09-13 | Discharge: 2022-09-13 | Disposition: A | Discharge status: Home / Self Care | Payer: Medicare Other | Source: Ambulatory Visit | Attending: Cardiology | Admitting: Cardiology

## 2022-09-13 DIAGNOSIS — Z48812 Encounter for surgical aftercare following surgery on the circulatory system: Secondary | ICD-10-CM | POA: Insufficient documentation

## 2022-09-13 DIAGNOSIS — I2129 ST elevation (STEMI) myocardial infarction involving other sites: Secondary | ICD-10-CM

## 2022-09-13 DIAGNOSIS — I252 Old myocardial infarction: Secondary | ICD-10-CM | POA: Diagnosis not present

## 2022-09-14 DIAGNOSIS — H538 Other visual disturbances: Secondary | ICD-10-CM | POA: Diagnosis not present

## 2022-09-14 DIAGNOSIS — H579 Unspecified disorder of eye and adnexa: Secondary | ICD-10-CM | POA: Diagnosis not present

## 2022-09-15 ENCOUNTER — Encounter (HOSPITAL_COMMUNITY)
Admission: RE | Admit: 2022-09-15 | Discharge: 2022-09-15 | Disposition: A | Discharge status: Home / Self Care | Payer: Medicare Other | Source: Ambulatory Visit | Attending: Cardiology | Admitting: Cardiology

## 2022-09-15 DIAGNOSIS — Z48812 Encounter for surgical aftercare following surgery on the circulatory system: Secondary | ICD-10-CM | POA: Diagnosis not present

## 2022-09-15 DIAGNOSIS — I252 Old myocardial infarction: Secondary | ICD-10-CM | POA: Diagnosis not present

## 2022-09-15 DIAGNOSIS — I2129 ST elevation (STEMI) myocardial infarction involving other sites: Secondary | ICD-10-CM

## 2022-09-18 ENCOUNTER — Encounter (HOSPITAL_COMMUNITY)
Admission: RE | Admit: 2022-09-18 | Discharge: 2022-09-18 | Disposition: A | Discharge status: Home / Self Care | Payer: Medicare Other | Source: Ambulatory Visit | Attending: Cardiology | Admitting: Cardiology

## 2022-09-18 DIAGNOSIS — I2129 ST elevation (STEMI) myocardial infarction involving other sites: Secondary | ICD-10-CM

## 2022-09-18 DIAGNOSIS — N1832 Chronic kidney disease, stage 3b: Secondary | ICD-10-CM | POA: Diagnosis not present

## 2022-09-18 DIAGNOSIS — Z48812 Encounter for surgical aftercare following surgery on the circulatory system: Secondary | ICD-10-CM | POA: Diagnosis not present

## 2022-09-18 DIAGNOSIS — I252 Old myocardial infarction: Secondary | ICD-10-CM | POA: Diagnosis not present

## 2022-09-19 NOTE — Progress Notes (Signed)
Cardiac Individual Treatment Plan  Patient Details  Name: Garrett RIZZI Sr. MRN: [id number] Date of Birth: 03-Jan-1941 Referring Provider:   Flowsheet Row INTENSIVE CARDIAC REHAB ORIENT from 07/27/2022 in [place]  Referring Provider Dr Candee Furbish, MD       Initial Encounter Date:  [facility] from 07/27/2022 in [facility]  Date 07/27/22       Visit Diagnosis: 06/15/22 STEMI, unsuccessful PCI, SVG  Patient's Home Medications on Admission:  Current Outpatient Medications:    ACCU-CHEK GUIDE test strip, USE TWICE DAILY, Disp: 200 strip, Rfl: 3   acetaminophen (TYLENOL) 650 MG CR tablet, Take 650 mg by mouth every 8 (eight) hours as needed for pain., Disp: , Rfl:    allopurinol (ZYLOPRIM) 300 MG tablet, Take 300 mg by mouth every other day. Take in the evening - on even days of the month, Disp: , Rfl:    atorvastatin (LIPITOR) 40 MG tablet, Take 1 tablet (40 mg total) by mouth daily., Disp: 30 tablet, Rfl: 11   Cholecalciferol (VITAMIN D3) 2000 UNITS TABS, Take 2,000 Units by mouth daily. , Disp: , Rfl:    clonazePAM (KLONOPIN) 1 MG tablet, Take 0.5 mg by mouth at bedtime., Disp: , Rfl:    clopidogrel (PLAVIX) 75 MG tablet, TAKE 1 TABLET BY MOUTH  DAILY, Disp: 90 tablet, Rfl: 1   Coenzyme Q10 300 MG CAPS, Take 300 mg by mouth daily., Disp: , Rfl:    Cyanocobalamin (VITAMIN B-12 PO), Take 2,500 mcg by mouth daily., Disp: , Rfl:    ELIQUIS 5 MG TABS tablet, TAKE 1 TABLET BY MOUTH TWICE A DAY, Disp: 180 tablet, Rfl: 1   ezetimibe (ZETIA) 10 MG tablet, TAKE 1 TABLET BY MOUTH  DAILY, Disp: 90 tablet, Rfl: 3   fenofibrate micronized (LOFIBRA) 200 MG capsule, Take 1 capsule (200 mg total) by mouth daily before breakfast., Disp: 90 capsule, Rfl: 3   Ferrous Sulfate Dried (HIGH POTENCY IRON) 65 MG TABS, Take 65 mg by mouth daily., Disp: , Rfl:    fluorouracil (EFUDEX) 5 % cream, Apply 1  Application topically daily as needed (cancer spots). Use as directed, Disp: , Rfl:    fluticasone (FLONASE) 50 MCG/ACT nasal spray, Place 2 sprays into both nostrils daily as needed for allergies., Disp: , Rfl:    glipiZIDE (GLUCOTROL) 5 MG tablet, Take 0.5 tablets (2.5 mg total) by mouth daily before breakfast AND 1 tablet (5 mg total) daily before supper., Disp: 135 tablet, Rfl: 3   levothyroxine (SYNTHROID, LEVOTHROID) 175 MCG tablet, Take 175 mcg by mouth daily before breakfast., Disp: , Rfl:    lidocaine (ASPERCREME LIDOCAINE) 4 %, Place 1 patch onto the skin daily as needed (pain)., Disp: , Rfl:    loratadine (CLARITIN) 10 MG tablet, Take 10 mg by mouth at bedtime., Disp: , Rfl:    magnesium oxide (MAG-OX) 400 (240 Mg) MG tablet, Take 400 mg by mouth daily., Disp: , Rfl:    metoprolol tartrate (LOPRESSOR) 25 MG tablet, TAKE 1 TABLET BY MOUTH  TWICE DAILY, Disp: 180 tablet, Rfl: 3   Multiple Vitamins-Minerals (PRESERVISION AREDS 2) CAPS, Take 1 capsule by mouth 2 (two) times daily., Disp: , Rfl:    nitroGLYCERIN (NITROSTAT) 0.4 MG SL tablet, Place 1 tablet (0.4 mg total) under the tongue every 5 (five) minutes as needed for chest pain., Disp: 25 tablet, Rfl: 3   olmesartan (BENICAR) 20 MG tablet, Take 20  mg by mouth daily., Disp: , Rfl:    pantoprazole (PROTONIX) 40 MG tablet, TAKE 1 TABLET BY MOUTH DAILY, Disp: 90 tablet, Rfl: 3   Polyethyl Glycol-Propyl Glycol (SYSTANE OP), Place 1 drop into both eyes daily as needed (dry/irritated eyes)., Disp: , Rfl:    Tamsulosin HCl (FLOMAX) 0.4 MG CAPS, Take 0.4 mg by mouth every other day. Take in the evening - on even days of the month, Disp: , Rfl:    traMADol (ULTRAM) 50 MG tablet, Take 50 mg by mouth as needed., Disp: , Rfl:    triamcinolone cream (KENALOG) 0.1 %, Apply 1 application  topically daily as needed (rash)., Disp: , Rfl: 2   TURMERIC PO, Take 2 tablets by mouth in the morning and at bedtime., Disp: , Rfl:    VASCEPA 1 g capsule, TAKE 2  CAPSULES BY MOUTH 2 TIMES DAILY., Disp: 360 capsule, Rfl: 3  Past Medical History: Past Medical History:  Diagnosis Date   AAA (abdominal aortic aneurysm) ([place])    Arthritis    knees   Atrial fibrillation (HCC)    CAD (coronary artery disease)    Cancer (HCC)    prostate   CKD (chronic kidney disease), stage III ([place]) 06/18/2022   COVID    06/2019 and in 2022 states they were mild cases   Diabetes mellitus without complication (HCC)    Dysrhythmia    A-fib pt is on Eliquis   GERD (gastroesophageal reflux disease)    Gout    History of kidney stones    Hyperlipidemia    Hypertension    Hypothyroidism    Myocardial infarction ([place]) 01/1991   sp inferior   Nerve compression    right leg   Reflux    Seasonal allergies    Thoracic ascending aortic aneurysm (HCC)    Thyroid disease    hypothyroidism    Tobacco Use: Social History   Tobacco Use  Smoking Status Former   Types: Cigarettes   Quit date: 08/01/1970   Years since quitting: 52.1  Smokeless Tobacco Never    Labs: Review Flowsheet  More data exists      Latest Ref Rng & Units 09/29/2021 03/30/2022 06/13/2022 06/15/2022 08/11/2022  Labs for ITP Cardiac and Pulmonary Rehab  Cholestrol 100 - 199 mg/dL - - - 149  99   LDL (calc) 0 - 99 mg/dL - - - 45  36   HDL-C >39 mg/dL - - - 30  32   Trlycerides 0 - 149 mg/dL - - - 372  192   Hemoglobin A1c 4.8 - 5.6 % 6.9  7.0  6.7  6.7  -    Capillary Blood Glucose: Lab Results  Component Value Date   GLUCAP 183 (H) 08/04/2022   GLUCAP 218 (H) 08/04/2022   GLUCAP 211 (H) 08/02/2022   GLUCAP 267 (H) 08/02/2022   GLUCAP 214 (H) 07/31/2022     Exercise Target Goals: Exercise Program Goal: Individual exercise prescription set using results from initial 6 min walk test and THRR while considering  patient's activity barriers and safety.   Exercise Prescription Goal: Initial exercise prescription builds to 30-45 minutes a day of aerobic activity, 2-3 days per week.  Home  exercise guidelines will be given to patient during program as part of exercise prescription that the participant will acknowledge.  Activity Barriers & Risk Stratification:  Activity Barriers & Cardiac Risk Stratification - 07/27/22 1504       Activity Barriers & Cardiac Risk Stratification  Activity Barriers Back Problems;Neck/Spine Problems;Joint Problems;Deconditioning;Balance Concerns    Cardiac Risk Stratification High   2.54 mets on 6MWT            6 Minute Walk:  6 Minute Walk     Row Name 07/27/22 1307         6 Minute Walk   Phase Initial     Distance 1448 feet     Walk Time 6 minutes     # of Rest Breaks 0     MPH 2.74     METS 2.54     RPE 11     Perceived Dyspnea  1     VO2 Peak 8.9     Symptoms Yes (comment)     Comments bilateral hip pain, 3/10. resolved with rest     Resting HR 71 bpm     Resting BP 118/72     Resting Oxygen Saturation  97 %     Exercise Oxygen Saturation  during 6 min walk 97 %     Max Ex. HR 95 bpm     Max Ex. BP 140/82     2 Minute Post BP 126/70              Oxygen Initial Assessment:   Oxygen Re-Evaluation:   Oxygen Discharge (Final Oxygen Re-Evaluation):   Initial Exercise Prescription:  Initial Exercise Prescription - 07/27/22 1500       Date of Initial Exercise RX and Referring Provider   Date 07/27/22    Referring Provider Dr Candee Furbish, MD    Expected Discharge Date 09/22/22      Recumbant Bike   Level 2    RPM 60    Minutes 15    METs 2.2      NuStep   Level 2    SPM 75    Minutes 15    METs 2.2      Prescription Details   Frequency (times per week) 3    Duration Progress to 30 minutes of continuous aerobic without signs/symptoms of physical distress      Intensity   THRR 40-80% of Max Heartrate 56-113    Ratings of Perceived Exertion 11-13    Perceived Dyspnea 0-4      Progression   Progression Continue progressive overload as per policy without signs/symptoms or physical distress.       Resistance Training   Training Prescription Yes    Weight 3    Reps 10-15             Perform Capillary Blood Glucose checks as needed.  Exercise Prescription Changes:   Exercise Prescription Changes     Row Name 07/31/22 1100 08/16/22 1400 08/21/22 1100 08/30/22 1015 09/18/22 1100     Response to Exercise   Blood Pressure (Admit) 122/70 128/74 122/72 124/72 110/78   Blood Pressure (Exercise) 128/68 118/68 150/82 132/70 128/70   Blood Pressure (Exit) 120/60 110/70 102/78 120/72 128/70   Heart Rate (Admit) 71 bpm 68 bpm 70 bpm 71 bpm 70 bpm   Heart Rate (Exercise) 113 bpm 86 bpm 93 bpm 91 bpm 106 bpm   Heart Rate (Exit) 69 bpm 68 bpm 70 bpm 68 bpm 79 bpm   Rating of Perceived Exertion (Exercise) '12 12 12 11 11   '$ Symptoms None None None None None   Comments Pt's first day in the [facility]2 program Reviewed METs Reviewed Home exercise Rx Reviewed METs and goals Reviewed METs   Duration Continue with  30 min of aerobic exercise without signs/symptoms of physical distress. Continue with 30 min of aerobic exercise without signs/symptoms of physical distress. Continue with 30 min of aerobic exercise without signs/symptoms of physical distress. Continue with 30 min of aerobic exercise without signs/symptoms of physical distress. Continue with 30 min of aerobic exercise without signs/symptoms of physical distress.   Intensity THRR unchanged THRR unchanged THRR unchanged THRR unchanged THRR unchanged     Progression   Progression Continue to progress workloads to maintain intensity without signs/symptoms of physical distress. Continue to progress workloads to maintain intensity without signs/symptoms of physical distress. Continue to progress workloads to maintain intensity without signs/symptoms of physical distress. Continue to progress workloads to maintain intensity without signs/symptoms of physical distress. Continue to progress workloads to maintain intensity without signs/symptoms of  physical distress.   Average METs 2.45 2.75 2.9 3.05 3     Resistance Training   Training Prescription Yes No Yes No No   Weight 3 No weights on Wednesdays 3 lb wts No weights on Wednesdays 5 lbs   Reps 10-15 -- -- -- 10-15   Time 10 Minutes -- -- -- 10 Minutes     Interval Training   Interval Training No No No No No     Bike   Level -- -- 1 1 1.5   Minutes -- -- '15 15 15   '$ METs -- -- 3.08 3.05 2.41     Recumbant Bike   Level 2 2.5 -- -- --   Minutes 15 15 -- -- --   METs 2.2 2.5 -- -- --     NuStep   Level '2 4 4 4 4   '$ SPM 102 -- -- -- --   Minutes '15 15 15 15 15   '$ METs 2.7 3 2.7 3 3.3     Home Exercise Plan   Plans to continue exercise at -- -- [facility] (comment) Home (comment) Home (comment)   Frequency -- -- Add 2 additional days to program exercise sessions. Add 3 additional days to program exercise sessions. Add 3 additional days to program exercise sessions.   Initial Home Exercises Provided -- -- 08/21/22 08/21/22 08/21/22            Exercise Comments:   Exercise Comments     Row Name 07/31/22 1158 08/16/22 1434 08/21/22 1156 08/30/22 1015 09/18/22 1111   Exercise Comments Pt's first day in the [facility] program. No complaints with todays session. Reviwed METs today. Pt making good progress, increased workload on Nustep. Pt requests to try Air Dyne bike next session instead of recumebnt bike. Reviewed home exericse Rx. Pt will walk 2x/week for 30 minutes. Pt may return to the the [facility] exercise. Pt verbalized understanding of the home exercise Rx and was provided a copy. Reviewed Making progress and will begin to use [employer] bike at home on off days from the [place] program. Reviewed METs. Pt progressing, encouraged to increase exercise workloads. Pt using airdyne bike at home; did 2x last week. WIll go to [facility] when finished with CRP2 program.            Exercise Goals and Review:   Exercise Goals     Row Name 07/27/22 1507              Exercise Goals   Increase Physical Activity Yes       Intervention Provide advice, education, support and counseling about physical activity/exercise needs.;Develop an individualized exercise prescription for aerobic and resistive  training based on initial evaluation findings, risk stratification, comorbidities and participant's personal goals.       Expected Outcomes Short Term: Attend rehab on a regular basis to increase amount of physical activity.;Long Term: Add in home exercise to make exercise part of routine and to increase amount of physical activity.;Long Term: Exercising regularly at least 3-5 days a week.       Increase Strength and Stamina Yes       Intervention Provide advice, education, support and counseling about physical activity/exercise needs.;Develop an individualized exercise prescription for aerobic and resistive training based on initial evaluation findings, risk stratification, comorbidities and participant's personal goals.       Expected Outcomes Short Term: Increase workloads from initial exercise prescription for resistance, speed, and METs.;Short Term: Perform resistance training exercises routinely during rehab and add in resistance training at home;Long Term: Improve cardiorespiratory fitness, muscular endurance and strength as measured by increased METs and functional capacity (6MWT)       Able to understand and use rate of perceived exertion (RPE) scale Yes       Intervention Provide education and explanation on how to use RPE scale       Expected Outcomes Short Term: Able to use RPE daily in rehab to express subjective intensity level;Long Term:  Able to use RPE to guide intensity level when exercising independently       Knowledge and understanding of Target Heart Rate Range (THRR) Yes       Intervention Provide education and explanation of THRR including how the numbers were predicted and where they are located for reference       Expected Outcomes Short Term: Able to  state/look up THRR;Short Term: Able to use daily as guideline for intensity in rehab;Long Term: Able to use THRR to govern intensity when exercising independently       Understanding of Exercise Prescription Yes       Intervention Provide education, explanation, and written materials on patient's individual exercise prescription       Expected Outcomes Short Term: Able to explain program exercise prescription;Long Term: Able to explain home exercise prescription to exercise independently                Exercise Goals Re-Evaluation :  Exercise Goals Re-Evaluation     Row Name 07/31/22 1157 08/30/22 1015           Exercise Goal Re-Evaluation   Exercise Goals Review Increase Physical Activity;Increase Strength and Stamina;Able to understand and use rate of perceived exertion (RPE) scale;Knowledge and understanding of Target Heart Rate Range (THRR);Understanding of Exercise Prescription Increase Physical Activity;Increase Strength and Stamina;Able to understand and use rate of perceived exertion (RPE) scale;Knowledge and understanding of Target Heart Rate Range (THRR);Understanding of Exercise Prescription      Comments Pt's first day in the CRP2 program. Pt understands the exercise Rx, RPE sclae and THRR. Reviewed METs and goals. Pt with peak METs of 3.05. Pt voices imrpoved stregth and stamina which are patient goals. Pt voices that he bought an AirDyne bike last night for home use. Pt will begin using this for his home exercise.      Expected Outcomes Will continue to monitor patient and progress exercise workloads as tolerated. Will continue to monitor patient and progress exercise workloads as tolerated.               Discharge Exercise Prescription (Final Exercise Prescription Changes):  Exercise Prescription Changes - 09/18/22 1100  Response to Exercise   Blood Pressure (Admit) 110/78    Blood Pressure (Exercise) 128/70    Blood Pressure (Exit) 128/70    Heart Rate  (Admit) 70 bpm    Heart Rate (Exercise) 106 bpm    Heart Rate (Exit) 79 bpm    Rating of Perceived Exertion (Exercise) 11    Symptoms None    Comments Reviewed METs    Duration Continue with 30 min of aerobic exercise without signs/symptoms of physical distress.    Intensity THRR unchanged      Progression   Progression Continue to progress workloads to maintain intensity without signs/symptoms of physical distress.    Average METs 3      Resistance Training   Training Prescription No    Weight 5 lbs    Reps 10-15    Time 10 Minutes      Interval Training   Interval Training No      Bike   Level 1.5    Minutes 15    METs 2.41      NuStep   Level 4    Minutes 15    METs 3.3      Home Exercise Plan   Plans to continue exercise at Home (comment)    Frequency Add 3 additional days to program exercise sessions.    Initial Home Exercises Provided 08/21/22             Nutrition:  Target Goals: Understanding of nutrition guidelines, daily intake of sodium '1500mg'$ , cholesterol '200mg'$ , calories 30% from fat and 7% or less from saturated fats, daily to have 5 or more servings of fruits and vegetables.  Biometrics:  Pre Biometrics - 07/27/22 1302       Pre Biometrics   Waist Circumference 43 inches    Hip Circumference 41.5 inches    Waist to Hip Ratio 1.04 %    Triceps Skinfold 9 mm    % Body Fat 27.8 %    Grip Strength 42 kg    Flexibility 13.5 in    Single Leg Stand 2 seconds              Nutrition Therapy Plan and Nutrition Goals:  Nutrition Therapy & Goals - 09/13/22 1255       Nutrition Therapy   Diet Heart Healthy Diet    Drug/Food Interactions Statins/Certain Fruits      Personal Nutrition Goals   Nutrition Goal Patient to identify strategies for managing cardiovascular risk by attending the weekly nutiriton educatoin and nutrition courses    Personal Goal #2 Patient to identify food sources and limit daily intake of saturated fat, trans fat,  sodium, and refined carbohydrates    Personal Goal #3 Patient to reduce sodium to '1500mg'$  daily    Comments Goals in progress. Per diet recall, patient reports increased vegetable intake and reduced sodium intake.     He does have room for further improvement to reduce refined carbohydrates (baked goods, biscuits, etc) and reduce sodium further. He continues Vascepa and Fenofibrate for elevated triglycerides; triglycerides remains elevated but improved.      Intervention Plan   Intervention Prescribe, educate and counsel regarding individualized specific dietary modifications aiming towards targeted core components such as weight, hypertension, lipid management, diabetes, heart failure and other comorbidities.;Nutrition handout(s) given to patient.    Expected Outcomes Short Term Goal: Understand basic principles of dietary content, such as calories, fat, sodium, cholesterol and nutrients.;Long Term Goal: Adherence to prescribed nutrition plan.  Nutrition Assessments:  Nutrition Assessments - 07/31/22 1508       Rate Your Plate Scores   Pre Score 43            MEDIFICTS Score Key: ?70 Need to make dietary changes  40-70 Heart Healthy Diet ? 40 Therapeutic Level Cholesterol Diet   Flowsheet Row INTENSIVE CARDIAC REHAB from 07/31/2022 in [facility]  Picture Your Plate Total Score on Admission 43      Picture Your Plate Scores: <00 Unhealthy dietary pattern with much room for improvement. 41-50 Dietary pattern unlikely to meet recommendations for good health and room for improvement. 51-60 More healthful dietary pattern, with some room for improvement.  >60 Healthy dietary pattern, although there may be some specific behaviors that could be improved.    Nutrition Goals Re-Evaluation:  Nutrition Goals Re-Evaluation     Row Name 07/31/22 0955 08/21/22 0955 09/13/22 1255         Goals   Current Weight 203 lb 7.8 oz (92.3 kg) 202  lb 13.2 oz (92 kg) 200 lb 9.9 oz (91 kg)     Comment VLDL 74, HDL 30, triglycerides 372, GFR 51, CR 1.41, A1c 6.7 Triglycerides improved but still elevated 192, HDL 32, GFR 34, Cr 1.95. A1c has not been re-checked but most recent labs show A1c of 6.7 on 06/15/2022. Triglycerides improved but still elevated 192, HDL 32, GFR 34, Cr 1.95. A1c has not been re-checked but most recent labs show A1c of 6.7 on 06/15/2022.     Expected Outcome Patient has previously completed cardiac rehab and the maintenance program. Lipid panel remains elevated. Garrett Jordan has started making some lifestyle changes to aid with adhereance to a heart healthy diet. He continues to attend the [employer] education and nutrition series. He has started using his air-fryer more to aid with reducing fried foods. He has started to increase vegetables and fish intake. He continues Vascepa and Fenofibrate for elevated triglycerides; triglycerides remains elevated but improved. He has maintained weight since starting with our program. Goals in progress. Per diet recall, patient reports increased vegetable intake and reduced sodium intake.     He does have room for further improvement to reduce refined carbohydrates (baked goods, biscuits, etc) and reduce sodium further. He continues Vascepa and Fenofibrate for elevated triglycerides; triglycerides remains elevated but improved.              Nutrition Goals Re-Evaluation:  Nutrition Goals Re-Evaluation     Row Name 07/31/22 0955 08/21/22 0955 09/13/22 1255         Goals   Current Weight 203 lb 7.8 oz (92.3 kg) 202 lb 13.2 oz (92 kg) 200 lb 9.9 oz (91 kg)     Comment VLDL 74, HDL 30, triglycerides 372, GFR 51, CR 1.41, A1c 6.7 Triglycerides improved but still elevated 192, HDL 32, GFR 34, Cr 1.95. A1c has not been re-checked but most recent labs show A1c of 6.7 on 06/15/2022. Triglycerides improved but still elevated 192, HDL 32, GFR 34, Cr 1.95. A1c has not been re-checked but most recent labs show  A1c of 6.7 on 06/15/2022.     Expected Outcome Patient has previously completed cardiac rehab and the maintenance program. Lipid panel remains elevated. Garrett Jordan has started making some lifestyle changes to aid with adhereance to a heart healthy diet. He continues to attend the [employer] education and nutrition series. He has started using his air-fryer more to aid with reducing fried foods. He has started  to increase vegetables and fish intake. He continues Vascepa and Fenofibrate for elevated triglycerides; triglycerides remains elevated but improved. He has maintained weight since starting with our program. Goals in progress. Per diet recall, patient reports increased vegetable intake and reduced sodium intake.     He does have room for further improvement to reduce refined carbohydrates (baked goods, biscuits, etc) and reduce sodium further. He continues Vascepa and Fenofibrate for elevated triglycerides; triglycerides remains elevated but improved.              Nutrition Goals Discharge (Final Nutrition Goals Re-Evaluation):  Nutrition Goals Re-Evaluation - 09/13/22 1255       Goals   Current Weight 200 lb 9.9 oz (91 kg)    Comment Triglycerides improved but still elevated 192, HDL 32, GFR 34, Cr 1.95. A1c has not been re-checked but most recent labs show A1c of 6.7 on 06/15/2022.    Expected Outcome Goals in progress. Per diet recall, patient reports increased vegetable intake and reduced sodium intake.     He does have room for further improvement to reduce refined carbohydrates (baked goods, biscuits, etc) and reduce sodium further. He continues Vascepa and Fenofibrate for elevated triglycerides; triglycerides remains elevated but improved.             Psychosocial: Target Goals: Acknowledge presence or absence of significant depression and/or stress, maximize coping skills, provide positive support system. Participant is able to verbalize types and ability to use techniques and skills needed  for reducing stress and depression.  Initial Review & Psychosocial Screening:  Initial Psych Review & Screening - 07/27/22 1212       Initial Review   Current issues with None Identified      Family Dynamics   Good Support System? Yes   Garrett Jordan lives alone he has his Son for support. Garrett Jordan's wife passed away 3 years ago     Barriers   Psychosocial barriers to participate in program There are no identifiable barriers or psychosocial needs.      Screening Interventions   Interventions Encouraged to exercise             Quality of Life Scores:  Quality of Life - 07/27/22 1508       Quality of Life   Select Quality of Life      Quality of Life Scores   Health/Function Pre 22.21 %    Socioeconomic Pre 23.71 %    Psych/Spiritual Pre 25.57 %    Family Pre 28.8 %    GLOBAL Pre 24.16 %            Scores of 19 and below usually indicate a poorer quality of life in these areas.  A difference of  2-3 points is a clinically meaningful difference.  A difference of 2-3 points in the total score of the Quality of Life Index has been associated with significant improvement in overall quality of life, self-image, physical symptoms, and general health in studies assessing change in quality of life.  PHQ-9: Review Flowsheet       07/27/2022  Depression screen PHQ 2/9  Decreased Interest 0  Down, Depressed, Hopeless 0  PHQ - 2 Score 0   Interpretation of Total Score  Total Score Depression Severity:  1-4 = Minimal depression, 5-9 = Mild depression, 10-14 = Moderate depression, 15-19 = Moderately severe depression, 20-27 = Severe depression   Psychosocial Evaluation and Intervention:   Psychosocial Re-Evaluation:  Psychosocial Re-Evaluation     [place] Name 07/31/22  1121 08/22/22 1629 09/19/22 1545         Psychosocial Re-Evaluation   Current issues with None Identified None Identified None Identified     Comments -- -- Garrett Jordan will complete intensive cardiac rehab on 09/25/22      Interventions Encouraged to attend Cardiac Rehabilitation for the exercise Encouraged to attend Cardiac Rehabilitation for the exercise Encouraged to attend Cardiac Rehabilitation for the exercise     Continue Psychosocial Services  No Follow up required No Follow up required No Follow up required              Psychosocial Discharge (Final Psychosocial Re-Evaluation):  Psychosocial Re-Evaluation - 09/19/22 1545       Psychosocial Re-Evaluation   Current issues with None Identified    Comments Garrett Jordan will complete intensive cardiac rehab on 09/25/22    Interventions Encouraged to attend Cardiac Rehabilitation for the exercise    Continue Psychosocial Services  No Follow up required             Vocational Rehabilitation: Provide vocational rehab assistance to qualifying candidates.   Vocational Rehab Evaluation & Intervention:  Vocational Rehab - 07/27/22 1213       Initial Vocational Rehab Evaluation & Intervention   Assessment shows need for Vocational Rehabilitation No   Garrett Jordan is retired and does not need vocational rehab at this time            Education: Education Goals: Education classes will be provided on a weekly basis, covering required topics. Participant will state understanding/return demonstration of topics presented.    Education     [place] Name 07/31/22 0900     Education   Cardiac Education Topics Pritikin   Select Workshops     Workshops   Educator Exercise Physiologist   Select Exercise   Exercise Workshop Exercise Basics: Building Your Action Plan   Instruction Review Code 1- Verbalizes Understanding   Class Start Time [phone]   Class Stop Time 6948   Class Time Calculation (min) 43 min    [place] Name 08/02/22 0900     Education   Cardiac Education Topics [employer]   Educator Dietitian   Weekly Topic Efficiency Cooking - Meals in a Snap   Instruction Review Code 1- Verbalizes Understanding   Class  Start Time 0809   Class Stop Time [id number]   Class Time Calculation (min) 35 min    [place] Name 08/04/22 0900     Education   Cardiac Education Topics Pritikin   Academic librarian Exercise Education   Exercise Education Move It!   Instruction Review Code 1- Verbalizes Understanding   Class Start Time 0815   Class Stop Time 0853   Class Time Calculation (min) 38 min    [place] Name 08/07/22 0900     Education   Cardiac Education Topics Pritikin   Select Core Videos     Core Videos   Educator Dietitian   Select Nutrition   Nutrition Nutrition Action Plan   Instruction Review Code 1- Verbalizes Understanding   Class Start Time 0815   Class Stop Time [phone]   Class Time Calculation (min) 37 min    [place] Name 08/09/22 0900     Education   Cardiac Education Topics Pritikin   Financial trader   Weekly  Topic Simple Sides and Sauces   Instruction Review Code 1- Verbalizes Understanding   Class Start Time 0809   Class Stop Time 0840   Class Time Calculation (min) 31 min    [place] Name 08/11/22 0900     Education   Cardiac Education Topics Pritikin   Select Core Videos     Core Videos   Educator Nurse   Select General Education   General Education Hypertension and Heart Disease   Instruction Review Code 1- Verbalizes Understanding   Class Start Time 0815   Class Stop Time 0850   Class Time Calculation (min) 35 min    [place] Name 08/14/22 0900     Education   Cardiac Education Topics Pritikin   Select Core Videos     Core Videos   Educator Dietitian   Select Nutrition   Nutrition Dining Out - Part 1   Instruction Review Code 1- Verbalizes Understanding   Class Start Time 0815   Class Stop Time 0850   Class Time Calculation (min) 35 min    [place] Name 08/16/22 0900     Education   Cardiac Education Topics [employer]   Educator  Dietitian   Weekly Topic One-Pot Wonders   Instruction Review Code 1- Verbalizes Understanding   Class Start Time [phone]   Class Stop Time 0845   Class Time Calculation (min) 35 min    [place] Name 08/18/22 0900     Education   Cardiac Education Topics Pritikin   Select Workshops     Workshops   Educator Exercise Physiologist   Select Psychosocial   Psychosocial Workshop New Thoughts, New Behaviors   Instruction Review Code 1- Verbalizes Understanding   Class Start Time 0810   Class Stop Time 0848   Class Time Calculation (min) 38 min    [place] Name 08/21/22 0900     Education   Cardiac Education Topics Pritikin   Select Core Videos     Core Videos   Educator Exercise Physiologist   Select Exercise Education   Exercise Education Biomechanial Limitations   Instruction Review Code 1- Verbalizes Understanding   Class Start Time 0815   Class Stop Time 0900   Class Time Calculation (min) 45 min    [place] Name 08/23/22 1000     Education   Cardiac Education Topics [employer]   Educator Dietitian   Weekly Topic Fast Evening Meals   Instruction Review Code 1- Verbalizes Understanding   Class Start Time [phone]   Class Stop Time 0847   Class Time Calculation (min) 35 min    [place] Name 08/25/22 0900     Education   Cardiac Education Topics Pritikin   Select Core Videos     Core Videos   Educator Exercise Physiologist   Select Psychosocial   Psychosocial How Our Thoughts Can Heal Our Hearts   Instruction Review Code 1- Verbalizes Understanding   Class Start Time 0815   Class Stop Time 0854   Class Time Calculation (min) 39 min    [place] Name 08/28/22 0900     Education   Cardiac Education Topics Pritikin   IT sales professional Nutrition   Nutrition Workshop Fueling a Designer, multimedia   Instruction Review Code 1- Verbalizes Understanding   Class Start Time [phone]   Class Stop Time [phone]  Class  Time Calculation (min) 42 min    [place] Name 08/30/22 1600     Education   Cardiac Education Topics [place]   Educator Dietitian   Weekly Topic Adding Flavor - Sodium-Free   Instruction Review Code 1- Verbalizes Understanding   Class Start Time 0809   Class Stop Time 0840   Class Time Calculation (min) 31 min    [place] Name 09/01/22 1200     Education   Cardiac Education Topics Pritikin   Tax inspector General Education   General Education Heart Disease Risk Reduction   Instruction Review Code 1- Verbalizes Understanding   Class Start Time 0808   Class Stop Time 0850   Class Time Calculation (min) 42 min    [place] Name 09/04/22 0900     Education   Cardiac Education Topics Pritikin   Select Workshops     Workshops   Educator Exercise Physiologist   Select Psychosocial   Psychosocial Workshop Recognizing and Reducing Stress   Instruction Review Code 1- Verbalizes Understanding   Class Start Time 0810   Class Stop Time 0854   Class Time Calculation (min) 44 min    [place] Name 09/06/22 0900     Education   Cardiac Education Topics [employer]   Educator Dietitian   Weekly Topic Fast and Healthy Breakfasts   Instruction Review Code 1- Verbalizes Understanding   Class Start Time [phone]   Class Stop Time 0858   Class Time Calculation (min) 48 min    [place] Name 09/13/22 1200     Education   Cardiac Education Topics [employer]   Educator Dietitian   Weekly Topic Personalizing Your Pritikin Plate   Instruction Review Code 1- Verbalizes Understanding   Class Start Time 0810   Class Stop Time 0850   Class Time Calculation (min) 40 min    [place] Name 09/15/22 0900     Education   Cardiac Education Topics Pritikin   Select Core Videos     Core Videos   Educator Exercise Physiologist   Select  Psychosocial   Psychosocial Healthy Minds, Bodies, Hearts   Instruction Review Code 1- Verbalizes Understanding   Class Start Time [phone]   Class Stop Time 0845   Class Time Calculation (min) 35 min    [place] Name 09/18/22 0900     Education   Cardiac Education Topics Pritikin   IT sales professional Nutrition   Nutrition Workshop Label Reading   Instruction Review Code 1- Verbalizes Understanding   Class Start Time 0813   Class Stop Time 0904   Class Time Calculation (min) 51 min            Core Videos: Exercise    Move It!  Clinical staff conducted group or individual video education with verbal and written material and guidebook.  Patient learns the recommended Pritikin exercise program. Exercise with the goal of living a long, healthy life. Some of the health benefits of exercise include controlled diabetes, healthier blood pressure levels, improved cholesterol levels, improved heart and lung capacity, improved sleep, and better body composition. Everyone should speak with their doctor before starting or changing an exercise routine.  Biomechanical Limitations  Clinical staff conducted group or individual video education with verbal and written material and guidebook.  Patient learns how biomechanical limitations can impact exercise and how we can mitigate and possibly overcome limitations to have an impactful and balanced exercise routine.  Body Composition Clinical staff conducted group or individual video education with verbal and written material and guidebook.  Patient learns that body composition (ratio of muscle mass to fat mass) is a key component to assessing overall fitness, rather than body weight alone. Increased fat mass, especially visceral belly fat, can put [name] at increased risk for metabolic syndrome, type 2 diabetes, heart disease, and even death. It is recommended to combine diet and exercise (cardiovascular and resistance  training) to improve your body composition. Seek guidance from your physician and exercise physiologist before implementing an exercise routine.  Exercise Action Plan Clinical staff conducted group or individual video education with verbal and written material and guidebook.  Patient learns the recommended strategies to achieve and enjoy long-term exercise adherence, including variety, self-motivation, self-efficacy, and positive decision making. Benefits of exercise include fitness, good health, weight management, more energy, better sleep, less stress, and overall well-being.  Medical   Heart Disease Risk Reduction Clinical staff conducted group or individual video education with verbal and written material and guidebook.  Patient learns our heart is our most vital organ as it circulates oxygen, nutrients, white blood cells, and hormones throughout the entire body, and carries waste away. Data supports a plant-based eating plan like the Pritikin Program for its effectiveness in slowing progression of and reversing heart disease. The video provides a number of recommendations to address heart disease.   Metabolic Syndrome and Belly Fat  Clinical staff conducted group or individual video education with verbal and written material and guidebook.  Patient learns what metabolic syndrome is, how it leads to heart disease, and how one can reverse it and keep it from coming back. You have metabolic syndrome if you have 3 of the following 5 criteria: abdominal obesity, high blood pressure, high triglycerides, low HDL cholesterol, and high blood sugar.  Hypertension and Heart Disease Clinical staff conducted group or individual video education with verbal and written material and guidebook.  Patient learns that high blood pressure, or hypertension, is very common in the [place]. Hypertension is largely due to excessive salt intake, but other important risk factors include being overweight, physical  inactivity, drinking too much alcohol, smoking, and not eating enough potassium from fruits and vegetables. High blood pressure is a leading risk factor for heart attack, stroke, congestive heart failure, dementia, kidney failure, and premature death. Long-term effects of excessive salt intake include stiffening of the arteries and thickening of heart muscle and organ damage. Recommendations include ways to reduce hypertension and the risk of heart disease.  Diseases of Our Time - Focusing on Diabetes Clinical staff conducted group or individual video education with verbal and written material and guidebook.  Patient learns why the best way to stop diseases of our time is prevention, through food and other lifestyle changes. Medicine (such as prescription pills and surgeries) is often only a Band-Aid on the problem, not a long-term solution. Most common diseases of our time include obesity, type 2 diabetes, hypertension, heart disease, and cancer. The Pritikin Program is recommended and has been proven to help reduce, reverse, and/or prevent the damaging effects of metabolic syndrome.  Nutrition   Overview of the Pritikin Eating Plan  Clinical staff conducted group or individual video education with verbal and  written material and guidebook.  Patient learns about the [place] for disease risk reduction. The [place] emphasizes a wide variety of unrefined, minimally-processed carbohydrates, like fruits, vegetables, whole grains, and legumes. Go, Caution, and Stop food choices are explained. Plant-based and lean animal proteins are emphasized. Rationale provided for low sodium intake for blood pressure control, low added sugars for blood sugar stabilization, and low added fats and oils for coronary artery disease risk reduction and weight management.  Calorie Density  Clinical staff conducted group or individual video education with verbal and written material and guidebook.   Patient learns about calorie density and how it impacts the Pritikin Eating Plan. Knowing the characteristics of the food you choose will help you decide whether those foods will lead to weight gain or weight loss, and whether you want to consume more or less of them. Weight loss is usually a side effect of the Pritikin Eating Plan because of its focus on low calorie-dense foods.  Label Reading  Clinical staff conducted group or individual video education with verbal and written material and guidebook.  Patient learns about the Pritikin recommended label reading guidelines and corresponding recommendations regarding calorie density, added sugars, sodium content, and whole grains.  Dining Out - Part 1  Clinical staff conducted group or individual video education with verbal and written material and guidebook.  Patient learns that restaurant meals can be sabotaging because they can be so high in calories, fat, sodium, and/or sugar. Patient learns recommended strategies on how to positively address this and avoid unhealthy pitfalls.  Facts on Fats  Clinical staff conducted group or individual video education with verbal and written material and guidebook.  Patient learns that lifestyle modifications can be just as effective, if not more so, as many medications for lowering your risk of heart disease. A Pritikin lifestyle can help to reduce your risk of inflammation and atherosclerosis (cholesterol build-up, or plaque, in the artery walls). Lifestyle interventions such as dietary choices and physical activity address the cause of atherosclerosis. A review of the types of fats and their impact on blood cholesterol levels, along with dietary recommendations to reduce fat intake is also included.  Nutrition Action Plan  Clinical staff conducted group or individual video education with verbal and written material and guidebook.  Patient learns how to incorporate Pritikin recommendations into their  lifestyle. Recommendations include planning and keeping personal health goals in mind as an important part of their success.  Healthy Mind-Set    Healthy Minds, Bodies, Hearts  Clinical staff conducted group or individual video education with verbal and written material and guidebook.  Patient learns how to identify when they are stressed. Video will discuss the impact of that stress, as well as the many benefits of stress management. Patient will also be introduced to stress management techniques. The way we think, act, and feel has an impact on our hearts.  How Our Thoughts Can Heal Our Hearts  Clinical staff conducted group or individual video education with verbal and written material and guidebook.  Patient learns that negative thoughts can cause depression and anxiety. This can result in negative lifestyle behavior and serious health problems. Cognitive behavioral therapy is an effective method to help control our thoughts in order to change and improve our emotional outlook.  Additional Videos:  Exercise    Improving Performance  Clinical staff conducted group or individual video education with verbal and written material and guidebook.  Patient learns to use a non-linear approach by  alternating intensity levels and lengths of time spent exercising to help burn more calories and lose more body fat. Cardiovascular exercise helps improve heart health, metabolism, hormonal balance, blood sugar control, and recovery from fatigue. Resistance training improves strength, endurance, balance, coordination, reaction time, metabolism, and muscle mass. Flexibility exercise improves circulation, posture, and balance. Seek guidance from your physician and exercise physiologist before implementing an exercise routine and learn your capabilities and proper form for all exercise.  Introduction to Yoga  Clinical staff conducted group or individual video education with verbal and written material and  guidebook.  Patient learns about yoga, a discipline of the coming together of mind, breath, and body. The benefits of yoga include improved flexibility, improved range of motion, better posture and core strength, increased lung function, weight loss, and positive self-image. Yoga's heart health benefits include lowered blood pressure, healthier heart rate, decreased cholesterol and triglyceride levels, improved immune function, and reduced stress. Seek guidance from your physician and exercise physiologist before implementing an exercise routine and learn your capabilities and proper form for all exercise.  Medical   Aging: Enhancing Your Quality of Life  Clinical staff conducted group or individual video education with verbal and written material and guidebook.  Patient learns key strategies and recommendations to stay in good physical health and enhance quality of life, such as prevention strategies, having an advocate, securing a [place], and keeping a list of medications and system for tracking them. It also discusses how to avoid risk for bone loss.  Biology of Weight Control  Clinical staff conducted group or individual video education with verbal and written material and guidebook.  Patient learns that weight gain occurs because we consume more calories than we burn (eating more, moving less). Even if your body weight is normal, you may have higher ratios of fat compared to muscle mass. Too much body fat puts you at increased risk for cardiovascular disease, heart attack, stroke, type 2 diabetes, and obesity-related cancers. In addition to exercise, following the [place] can help reduce your risk.  Decoding Lab Results  Clinical staff conducted group or individual video education with verbal and written material and guidebook.  Patient learns that lab test reflects one measurement whose values change over time and are influenced by many factors,  including medication, stress, sleep, exercise, food, hydration, pre-existing medical conditions, and more. It is recommended to use the knowledge from this video to become more involved with your lab results and evaluate your numbers to speak with your doctor.   Diseases of Our Time - Overview  Clinical staff conducted group or individual video education with verbal and written material and guidebook.  Patient learns that according to the CDC, 50% to 70% of chronic diseases (such as obesity, type 2 diabetes, elevated lipids, hypertension, and heart disease) are avoidable through lifestyle improvements including healthier food choices, listening to satiety cues, and increased physical activity.  Sleep Disorders Clinical staff conducted group or individual video education with verbal and written material and guidebook.  Patient learns how good quality and duration of sleep are important to overall health and well-being. Patient also learns about sleep disorders and how they impact health along with recommendations to address them, including discussing with a physician.  Nutrition  Dining Out - Part 2 Clinical staff conducted group or individual video education with verbal and written material and guidebook.  Patient learns how to plan ahead and communicate in order to maximize their dining  experience in a healthy and nutritious manner. Included are recommended food choices based on the type of restaurant the patient is visiting.   Fueling a Best boy conducted group or individual video education with verbal and written material and guidebook.  There is a strong connection between our food choices and our health. Diseases like obesity and type 2 diabetes are very prevalent and are in large-part due to lifestyle choices. The Pritikin Eating Plan provides plenty of food and hunger-curbing satisfaction. It is easy to follow, affordable, and helps reduce health risks.  Menu Workshop   Clinical staff conducted group or individual video education with verbal and written material and guidebook.  Patient learns that restaurant meals can sabotage health goals because they are often packed with calories, fat, sodium, and sugar. Recommendations include strategies to plan ahead and to communicate with the manager, chef, or server to help order a healthier meal.  Planning Your Eating Strategy  Clinical staff conducted group or individual video education with verbal and written material and guidebook.  Patient learns about the [place] and its benefit of reducing the risk of disease. The [place] does not focus on calories. Instead, it emphasizes high-quality, nutrient-rich foods. By knowing the characteristics of the foods, we choose, we can determine their calorie density and make informed decisions.  Targeting Your Nutrition Priorities  Clinical staff conducted group or individual video education with verbal and written material and guidebook.  Patient learns that lifestyle habits have a tremendous impact on disease risk and progression. This video provides eating and physical activity recommendations based on your personal health goals, such as reducing LDL cholesterol, losing weight, preventing or controlling type 2 diabetes, and reducing high blood pressure.  Vitamins and Minerals  Clinical staff conducted group or individual video education with verbal and written material and guidebook.  Patient learns different ways to obtain key vitamins and minerals, including through a recommended healthy diet. It is important to discuss all supplements you take with your doctor.   Healthy Mind-Set    Smoking Cessation  Clinical staff conducted group or individual video education with verbal and written material and guidebook.  Patient learns that cigarette smoking and tobacco addiction pose a serious health risk which affects millions of people. Stopping smoking  will significantly reduce the risk of heart disease, lung disease, and many forms of cancer. Recommended strategies for quitting are covered, including working with your doctor to develop a successful plan.  Culinary   Becoming a Financial trader conducted group or individual video education with verbal and written material and guidebook.  Patient learns that cooking at home can be healthy, cost-effective, quick, and puts them in control. Keys to cooking healthy recipes will include looking at your recipe, assessing your equipment needs, planning ahead, making it simple, choosing cost-effective seasonal ingredients, and limiting the use of added fats, salts, and sugars.  Cooking - Breakfast and Snacks  Clinical staff conducted group or individual video education with verbal and written material and guidebook.  Patient learns how important breakfast is to satiety and nutrition through the entire day. Recommendations include key foods to eat during breakfast to help stabilize blood sugar levels and to prevent overeating at meals later in the day. Planning ahead is also a key component.  Cooking - Human resources officer conducted group or individual video education with verbal and written material and guidebook.  Patient learns eating strategies to improve overall health,  including an approach to cook more at home. Recommendations include thinking of animal protein as a side on your plate rather than center stage and focusing instead on lower calorie dense options like vegetables, fruits, whole grains, and plant-based proteins, such as beans. Making sauces in large quantities to freeze for later and leaving the skin on your vegetables are also recommended to maximize your experience.  Cooking - Healthy Salads and Dressing Clinical staff conducted group or individual video education with verbal and written material and guidebook.  Patient learns that vegetables, fruits, whole  grains, and legumes are the foundations of the [place]. Recommendations include how to incorporate each of these in flavorful and healthy salads, and how to create homemade salad dressings. Proper handling of ingredients is also covered. Cooking - Soups and [employer] - Soups and Desserts Clinical staff conducted group or individual video education with verbal and written material and guidebook.  Patient learns that Pritikin soups and desserts make for easy, nutritious, and delicious snacks and meal components that are low in sodium, fat, sugar, and calorie density, while high in vitamins, minerals, and filling fiber. Recommendations include simple and healthy ideas for soups and desserts.   Overview     The Pritikin Solution Program Overview Clinical staff conducted group or individual video education with verbal and written material and guidebook.  Patient learns that the results of the [place] Program have been documented in more than 100 articles published in peer-reviewed journals, and the benefits include reducing risk factors for (and, in some cases, even reversing) high cholesterol, high blood pressure, type 2 diabetes, obesity, and more! An overview of the three key pillars of the Pritikin Program will be covered: eating well, doing regular exercise, and having a healthy mind-set.  WORKSHOPS  Exercise: Exercise Basics: Building Your Action Plan Clinical staff led group instruction and group discussion with PowerPoint presentation and patient guidebook. To enhance the learning environment the use of posters, models and videos may be added. At the conclusion of this workshop, patients will comprehend the difference between physical activity and exercise, as well as the benefits of incorporating both, into their routine. Patients will understand the FITT (Frequency, Intensity, Time, and Type) principle and how to use it to build an exercise action plan. In addition,  safety concerns and other considerations for exercise and cardiac rehab will be addressed by the presenter. The purpose of this lesson is to promote a comprehensive and effective weekly exercise routine in order to improve patients' overall level of fitness.   Managing Heart Disease: Your Path to a Healthier Heart Clinical staff led group instruction and group discussion with PowerPoint presentation and patient guidebook. To enhance the learning environment the use of posters, models and videos may be added.At the conclusion of this workshop, patients will understand the anatomy and physiology of the heart. Additionally, they will understand how Pritikin's three pillars impact the risk factors, the progression, and the management of heart disease.  The purpose of this lesson is to provide a high-level overview of the heart, heart disease, and how the Pritikin lifestyle positively impacts risk factors.  Exercise Biomechanics Clinical staff led group instruction and group discussion with PowerPoint presentation and patient guidebook. To enhance the learning environment the use of posters, models and videos may be added. Patients will learn how the structural parts of their bodies function and how these functions impact their daily activities, movement, and exercise. Patients will learn how to promote a neutral spine, learn  how to manage pain, and identify ways to improve their physical movement in order to promote healthy living. The purpose of this lesson is to expose patients to common physical limitations that impact physical activity. Participants will learn practical ways to adapt and manage aches and pains, and to minimize their effect on regular exercise. Patients will learn how to maintain good posture while sitting, walking, and lifting.  Balance Training and Fall Prevention  Clinical staff led group instruction and group discussion with PowerPoint presentation and patient guidebook. To  enhance the learning environment the use of posters, models and videos may be added. At the conclusion of this workshop, patients will understand the importance of their sensorimotor skills (vision, proprioception, and the vestibular system) in maintaining their ability to balance as they age. Patients will apply a variety of balancing exercises that are appropriate for their current level of function. Patients will understand the common causes for poor balance, possible solutions to these problems, and ways to modify their physical environment in order to minimize their fall risk. The purpose of this lesson is to teach patients about the importance of maintaining balance as they age and ways to minimize their risk of falling.  WORKSHOPS   Nutrition:  Fueling a Scientist, research (physical sciences) led group instruction and group discussion with PowerPoint presentation and patient guidebook. To enhance the learning environment the use of posters, models and videos may be added. Patients will review the foundational principles of the [place] and understand what constitutes a serving size in each of the food groups. Patients will also learn Pritikin-friendly foods that are better choices when away from home and review make-ahead meal and snack options. Calorie density will be reviewed and applied to three nutrition priorities: weight maintenance, weight loss, and weight gain. The purpose of this lesson is to reinforce (in a group setting) the key concepts around what patients are recommended to eat and how to apply these guidelines when away from home by planning and selecting Pritikin-friendly options. Patients will understand how calorie density may be adjusted for different weight management goals.  Mindful Eating  Clinical staff led group instruction and group discussion with PowerPoint presentation and patient guidebook. To enhance the learning environment the use of posters, models and videos may  be added. Patients will briefly review the concepts of the [place] and the importance of low-calorie dense foods. The concept of mindful eating will be introduced as well as the importance of paying attention to internal hunger signals. Triggers for non-hunger eating and techniques for dealing with triggers will be explored. The purpose of this lesson is to provide patients with the opportunity to review the basic principles of the [place], discuss the value of eating mindfully and how to measure internal cues of hunger and fullness using the Hunger Scale. Patients will also discuss reasons for non-hunger eating and learn strategies to use for controlling emotional eating.  Targeting Your Nutrition Priorities Clinical staff led group instruction and group discussion with PowerPoint presentation and patient guidebook. To enhance the learning environment the use of posters, models and videos may be added. Patients will learn how to determine their genetic susceptibility to disease by reviewing their family history. Patients will gain insight into the importance of diet as part of an overall healthy lifestyle in mitigating the impact of genetics and other environmental insults. The purpose of this lesson is to provide patients with the opportunity to assess their personal nutrition priorities by looking  at their family history, their own health history and current risk factors. Patients will also be able to discuss ways of prioritizing and modifying the [place] for their highest risk areas  Menu  Clinical staff led group instruction and group discussion with PowerPoint presentation and patient guidebook. To enhance the learning environment the use of posters, models and videos may be added. Using menus brought in from [employer], or printed from [employer], patients will apply the [place] dining out guidelines that were presented in the [employer] video. Patients will also be able to practice these guidelines in a variety of provided scenarios. The purpose of this lesson is to provide patients with the opportunity to practice hands-on learning of the [place] with actual menus and practice scenarios.  Label Reading Clinical staff led group instruction and group discussion with PowerPoint presentation and patient guidebook. To enhance the learning environment the use of posters, models and videos may be added. Patients will review and discuss the Pritikin label reading guidelines presented in Pritikin's Label Reading Educational series video. Using fool labels brought in from local grocery stores and markets, patients will apply the label reading guidelines and determine if the packaged food meet the Pritikin guidelines. The purpose of this lesson is to provide patients with the opportunity to review, discuss, and practice hands-on learning of the Pritikin Label Reading guidelines with actual packaged food labels. [place] Workshops are designed to teach patients ways to prepare quick, simple, and affordable recipes at home. The importance of nutrition's role in chronic disease risk reduction is reflected in its emphasis in the overall Pritikin program. By learning how to prepare essential core Pritikin Eating Plan recipes, patients will increase control over what they eat; be able to customize the flavor of foods without the use of added salt, sugar, or fat; and improve the quality of the food they consume. By learning a set of core recipes which are easily assembled, quickly prepared, and affordable, patients are more likely to prepare more healthy foods at home. These workshops focus on convenient breakfasts, simple entres, side dishes, and desserts which can be prepared with minimal effort and are consistent with nutrition recommendations for cardiovascular risk reduction. Cooking  [employer] are taught by a Engineer, materials (RD) who has been trained by the [employer]. The chef or RD has a clear understanding of the importance of minimizing - if not completely eliminating - added fat, sugar, and sodium in recipes. Throughout the series of [place] Workshop sessions, patients will learn about healthy ingredients and efficient methods of cooking to build confidence in their capability to prepare    Cooking School weekly topics:  Adding Flavor- Sodium-Free  Fast and Healthy Breakfasts  Powerhouse Plant-Based Proteins  Satisfying Salads and Dressings  Simple Sides and Sauces  International Cuisine-Spotlight on the [place] Zones  Delicious Desserts  Savory Soups  Efficiency Cooking - Meals in a Snap  Tasty Appetizers and Snacks  Comforting Weekend Breakfasts  One-Pot Wonders   Fast Evening Meals  Easy [place] (Psychosocial): New Thoughts, New Behaviors Clinical staff led group instruction and group discussion with PowerPoint presentation and patient guidebook. To enhance the learning environment the use of posters, models and videos may be added. Patients will learn and practice techniques for developing effective health and lifestyle goals. Patients will be able to effectively  apply the goal setting process learned to develop at least one new personal goal.  The purpose of this lesson is to expose patients to a new skill set of behavior modification techniques such as techniques setting SMART goals, overcoming barriers, and achieving new thoughts and new behaviors.  Managing Moods and Relationships Clinical staff led group instruction and group discussion with PowerPoint presentation and patient guidebook. To enhance the learning environment the use of posters, models and videos may be added. Patients will learn how emotional and chronic stress factors can impact  their health and relationships. They will learn healthy ways to manage their moods and utilize positive coping mechanisms. In addition, ICR patients will learn ways to improve communication skills. The purpose of this lesson is to expose patients to ways of understanding how one's mood and health are intimately connected. Developing a healthy outlook can help build positive relationships and connections with others. Patients will understand the importance of utilizing effective communication skills that include actively listening and being heard. They will learn and understand the importance of the "4 Cs" and especially Connections in fostering of a Healthy Mind-Set.  Healthy Sleep for a Healthy Heart Clinical staff led group instruction and group discussion with PowerPoint presentation and patient guidebook. To enhance the learning environment the use of posters, models and videos may be added. At the conclusion of this workshop, patients will be able to demonstrate knowledge of the importance of sleep to overall health, well-being, and quality of life. They will understand the symptoms of, and treatments for, common sleep disorders. Patients will also be able to identify daytime and nighttime behaviors which impact sleep, and they will be able to apply these tools to help manage sleep-related challenges. The purpose of this lesson is to provide patients with a general overview of sleep and outline the importance of quality sleep. Patients will learn about a few of the most common sleep disorders. Patients will also be introduced to the concept of "sleep hygiene," and discover ways to self-manage certain sleeping problems through simple daily behavior changes. Finally, the workshop will motivate patients by clarifying the links between quality sleep and their goals of heart-healthy living.   Recognizing and Reducing Stress Clinical staff led group instruction and group discussion with PowerPoint presentation  and patient guidebook. To enhance the learning environment the use of posters, models and videos may be added. At the conclusion of this workshop, patients will be able to understand the types of stress reactions, differentiate between acute and chronic stress, and recognize the impact that chronic stress has on their health. They will also be able to apply different coping mechanisms, such as reframing negative self-talk. Patients will have the opportunity to practice a variety of stress management techniques, such as deep abdominal breathing, progressive muscle relaxation, and/or guided imagery.  The purpose of this lesson is to educate patients on the role of stress in their lives and to provide healthy techniques for coping with it.  Learning Barriers/Preferences:  Learning Barriers/Preferences - 07/27/22 1509       Learning Barriers/Preferences   Learning Barriers Sight   pt wears glasses   Learning Preferences Computer/Internet;Group Instruction;Individual Instruction;Skilled Demonstration;Verbal Instruction             Education Topics:  Knowledge Questionnaire Score:  Knowledge Questionnaire Score - 07/27/22 1017       Knowledge Questionnaire Score   Pre Score 20/24             Core Components/Risk Factors/Patient Goals  at Admission:  Personal Goals and Risk Factors at Admission - 07/27/22 1510       Core Components/Risk Factors/Patient Goals on Admission    Weight Management Yes;Weight Maintenance    Intervention Weight Management: Develop a combined nutrition and exercise program designed to reach desired caloric intake, while maintaining appropriate intake of nutrient and fiber, sodium and fats, and appropriate energy expenditure required for the weight goal.;Weight Management: Provide education and appropriate resources to help participant work on and attain dietary goals.    Admit Weight 205 lb 14.6 oz (93.4 kg)    Diabetes Yes    Intervention Provide education  about signs/symptoms and action to take for hypo/hyperglycemia.;Provide education about proper nutrition, including hydration, and aerobic/resistive exercise prescription along with prescribed medications to achieve blood glucose in normal ranges: Fasting glucose 65-99 mg/dL    Expected Outcomes Short Term: Participant verbalizes understanding of the signs/symptoms and immediate care of hyper/hypoglycemia, proper foot care and importance of medication, aerobic/resistive exercise and nutrition plan for blood glucose control.;Long Term: Attainment of HbA1C < 7%.    Hypertension Yes    Intervention Provide education on lifestyle modifcations including regular physical activity/exercise, weight management, moderate sodium restriction and increased consumption of fresh fruit, vegetables, and low fat dairy, alcohol moderation, and smoking cessation.;Monitor prescription use compliance.    Expected Outcomes Short Term: Continued assessment and intervention until BP is < 140/48m HG in hypertensive participants. < 130/851mHG in hypertensive participants with diabetes, heart failure or chronic kidney disease.;Long Term: Maintenance of blood pressure at goal levels.    Lipids Yes    Intervention Provide education and support for participant on nutrition & aerobic/resistive exercise along with prescribed medications to achieve LDL '70mg'$ , HDL >'40mg'$ .    Expected Outcomes Short Term: Participant states understanding of desired cholesterol values and is compliant with medications prescribed. Participant is following exercise prescription and nutrition guidelines.;Long Term: Cholesterol controlled with medications as prescribed, with individualized exercise RX and with personalized nutrition plan. Value goals: LDL < '70mg'$ , HDL > 40 mg.    Stress Yes    Intervention Offer individual and/or small group education and counseling on adjustment to heart disease, stress management and health-related lifestyle change. Teach and  support self-help strategies.;Refer participants experiencing significant psychosocial distress to appropriate mental health specialists for further evaluation and treatment. When possible, include family members and significant others in education/counseling sessions.    Expected Outcomes Short Term: Participant demonstrates changes in health-related behavior, relaxation and other stress management skills, ability to obtain effective social support, and compliance with psychotropic medications if prescribed.;Long Term: Emotional wellbeing is indicated by absence of clinically significant psychosocial distress or social isolation.             Core Components/Risk Factors/Patient Goals Review:   Goals and Risk Factor Review     Row Name 07/31/22 1126 08/22/22 1629 09/19/22 1546         Core Components/Risk Factors/Patient Goals Review   Personal Goals Review Weight Management/Obesity;Hypertension;Lipids;Diabetes Weight Management/Obesity;Hypertension;Lipids;Diabetes Weight Management/Obesity;Hypertension;Lipids;Diabetes     Review Garrett Jordan started intensive cardiac rehab on 07/31/22 and did well with exercise. Vital signs and CBG's were stable. ToGershon Musselas been doing well with exercise at  intensive cardiac rehab. Vital signs and CBG's have been  stable. ToGershon Musselas been doing well with exercise at  intensive cardiac rehab. Vital signs and CBG's have been stable. ToGershon Musselill complete intensive cardiac rehab on 09/25/22     Expected Outcomes Garrett Jordan will continue to participate in intensive cardiac  rehab for exercise, Garrett Jordan will continue to participate in intensive cardiac rehab for exercise, nutrition and lifestyle modifications Garrett Jordan will continue to participate in intensive cardiac rehab for exercise, nutrition and lifestyle modifications              Core Components/Risk Factors/Patient Goals at Discharge (Final Review):   Goals and Risk Factor Review - 09/19/22 1546       Core Components/Risk  Factors/Patient Goals Review   Personal Goals Review Weight Management/Obesity;Hypertension;Lipids;Diabetes    Review Garrett Jordan has been doing well with exercise at  intensive cardiac rehab. Vital signs and CBG's have been stable. Garrett Jordan will complete intensive cardiac rehab on 09/25/22    Expected Outcomes Garrett Jordan will continue to participate in intensive cardiac rehab for exercise, nutrition and lifestyle modifications             ITP Comments:  ITP Comments     Row Name 07/27/22 1037 07/31/22 1120 08/22/22 1627 09/19/22 1543     ITP Comments 30 day ITP Review. Introduction to Pritikin Education Program/ Intensive Cardiac Rehab. Initial Orientation Packet Reviewed with the patient 30 Day ITP Review. Garrett Jordan started intensve cardiac rehab on 07/31/22 and did well with exercise. 30 Day ITP Review. Garrett Jordan has good attendance and participation in  intensve cardiac rehab 30 Day ITP Review. Garrett Jordan continues to have  good attendance and participation in intensve cardiac rehab. Garrett Jordan will complete intensive cardiac rehab on 09/25/22             Comments: See ITP comments.Harrell Gave RN BSN

## 2022-09-20 ENCOUNTER — Encounter (HOSPITAL_COMMUNITY)
Admission: RE | Admit: 2022-09-20 | Discharge: 2022-09-20 | Disposition: A | Discharge status: Home / Self Care | Payer: Medicare Other | Source: Ambulatory Visit | Attending: Cardiology | Admitting: Cardiology

## 2022-09-20 VITALS — Ht 69.5 in | Wt 199.7 lb

## 2022-09-20 DIAGNOSIS — I252 Old myocardial infarction: Secondary | ICD-10-CM | POA: Diagnosis not present

## 2022-09-20 DIAGNOSIS — I2129 ST elevation (STEMI) myocardial infarction involving other sites: Secondary | ICD-10-CM

## 2022-09-20 DIAGNOSIS — Z48812 Encounter for surgical aftercare following surgery on the circulatory system: Secondary | ICD-10-CM | POA: Diagnosis not present

## 2022-09-20 NOTE — Progress Notes (Unsigned)
Name: Garrett Jordan  Age/ Sex: 81 y.o., male   MRN/ DOB: [id number], 07/21/1941     PCP: Antony Contras, MD   Reason for Endocrinology Evaluation: Type 2 Diabetes Mellitus     Initial Endocrinology Clinic Visit:  03/17/2020    PATIENT IDENTIFIER: Garrett Jordan. is a 81 y.o. male with a past medical history of T2DM, RLS, CAD and CKD. The patient has followed with Endocrinology clinic since 03/17/2020 for consultative assistance with management of his diabetes.  DIABETIC HISTORY:  Garrett Jordan was diagnosed with T2 DM in 2019. Invokana- cost prohibitive . His hemoglobin A1c has ranged from 6.7% in 2020, peaking at 7.8 % in 2021    On his initial visit to our clinic his A1c was 6.5 % , he was on Januvia which was continued, Invokana was cost prohibitive and we switched it to Glipizide.   Januvia stopped 06/2020 due to high cost for pt  Jardiance started by nephrology by 07/2020    HYPERCALCEMIA HISTORY: Garrett Jordan indicates that he was first noted with hypercalcemia in 2020. He has hx of kidney stones  He is S/P Left parathyroidectomy in 1970's  Ca/Cr ratio 0.0210  SUBJECTIVE:    During the last visit (09/29/2021): A1c 6.1% We continued Jardiance and increased Glipizide    Today (09/21/2022): Garrett Jordan is here for a follow up on diabetes management.  He checks his blood sugars 2 times daily . The patient has not had hypoglycemic episodes since the last clinic visit.  He was seen by ophthalmology 09/14/2022 for flashing lights He had a follow with Dr. Cyndia Bent with cardiac and [facility] surgeons for aortic aneurysm S/P cardiac cath with STEMI    Has occasional loose stools  Denies nausea or vomiting   He has been off [place] per pt based oncardiology recommendation, I was unable to find a note on this recommendations  HOME DIABETES REGIMEN:  Glipizide 5 mg, half a tablet before Breakfast and 1 tab before Supper    Statin: yes ACE-I/ARB: yes     METER DOWNLOAD  SUMMARY:Unable to download   120-236 mg/dL     DIABETIC COMPLICATIONS: Microvascular complications:  CKD III Denies: retinopathy, neuropathy  Last Eye Exam: Completed 01/2022  Macrovascular complications:  CAD Denies: CVA, PVD       PHYSICAL EXAM: VS: BP 132/84 (BP Location: Left Arm, Patient Position: Sitting, Cuff Size: Large)   Pulse 82   Ht 5' 9.5" (1.765 m)   Wt 199 lb (90.3 kg)   SpO2 94%   BMI 28.97 kg/m    EXAM: General: Pt appears well and is in NAD  Lungs: Clear with good BS bilat with no rales, rhonchi, or wheezes  Heart: Auscultation: RRR  Abdomen: Normoactive bowel sounds, soft, nontender, without masses or organomegaly palpable  Extremities:  BL WE:XHBZJ edema on the left with scab formation due to an injury but no evidence of infection  Mental Status: Judgment, insight: intact Orientation: oriented to time, place, and person Mood and affect: no depression, anxiety, or agitation    DM Foot Exam 03/31/2021  The skin of the feet is intact without sores or ulcerations. The pedal pulses are 1+ on right and 1+ on left. The sensation is intact to a screening 5.07, 10 gram monofilament bilaterally    HISTORY:  Past Medical History:  Past Medical History:  Diagnosis Date   AAA (abdominal aortic aneurysm) (HCC)    Arthritis    knees  Atrial fibrillation (HCC)    CAD (coronary artery disease)    Cancer (HCC)    prostate   CKD (chronic kidney disease), stage III ([place]) 06/18/2022   COVID    06/2019 and in 2022 states they were mild cases   Diabetes mellitus without complication ([place])    Dysrhythmia    A-fib pt is on Eliquis   GERD (gastroesophageal reflux disease)    Gout    History of kidney stones    Hyperlipidemia    Hypertension    Hypothyroidism    Myocardial infarction ([place]) 01/1991   sp inferior   Nerve compression    right leg   Reflux    Seasonal allergies    Thoracic ascending aortic aneurysm ([place])    Thyroid disease     hypothyroidism   Past Surgical History:  Past Surgical History:  Procedure Laterality Date   ABDOMINAL AORTAGRAM N/A 02/28/2012   Procedure: ABDOMINAL Maxcine Ham;  Surgeon: Serafina Mitchell, MD;  Location: [facility] CATH LAB;  Service: Cardiovascular;  Laterality: N/A;   abdominal aortagram embolization  02/28/2012   ABDOMINAL AORTIC ANEURYSM REPAIR  07/14/2012   CARDIAC CATHETERIZATION     CORONARY ARTERY BYPASS GRAFT  04/13/2009   CORONARY BALLOON ANGIOPLASTY N/A 06/15/2022   Procedure: CORONARY BALLOON ANGIOPLASTY;  Surgeon: Sherren Mocha, MD;  Location: [place] CV LAB;  Service: Cardiovascular;  Laterality: N/A;   CORONARY STENT PLACEMENT  1992 and  2009   RCA   EMBOLIZATION Right 02/28/2012   Procedure: EMBOLIZATION;  Surgeon: Serafina Mitchell, MD;  Location: [facility] CATH LAB;  Service: Cardiovascular;  Laterality: Right;   IR GENERIC HISTORICAL  12/05/2016   IR RADIOLOGIST EVAL & MGMT 12/05/2016 Jacqulynn Cadet, MD [facility] INTERV RAD   IR RADIOLOGIST EVAL & MGMT  07/10/2019   IR RADIOLOGIST EVAL & MGMT  08/25/2020   IR RADIOLOGIST EVAL & MGMT  08/16/2021   IR RADIOLOGIST EVAL & MGMT  08/31/2022   KIDNEY STONE SURGERY     LEFT HEART CATH AND CORONARY ANGIOGRAPHY N/A 06/15/2022   Procedure: LEFT HEART CATH AND CORONARY ANGIOGRAPHY;  Surgeon: Sherren Mocha, MD;  Location: [place] CV LAB;  Service: Cardiovascular;  Laterality: N/A;   LEFT HEART CATHETERIZATION WITH CORONARY/GRAFT ANGIOGRAM N/A 01/08/2015   Procedure: LEFT HEART CATHETERIZATION WITH Beatrix Fetters;  Surgeon: Jacolyn Reedy, MD;  Location: [facility] CATH LAB;  Service: Cardiovascular;  Laterality: N/A;   parathyroid adenoma     PROSTATE SURGERY     rad seeds   SPINE SURGERY     TONSILLECTOMY     Social History:  reports that he quit smoking about 52 years ago. His smoking use included cigarettes. He has never used smokeless tobacco. He reports current alcohol use of about 6.0 standard drinks of alcohol per week. He  reports that he does not use drugs. Family History:  Family History  Problem Relation Age of Onset   Heart disease Father        Heart Disease before age 75   Heart attack Father    Hyperlipidemia Father    Hypertension Father    Stroke Mother    Deep vein thrombosis Mother    Heart disease Sister    Diabetes Sister    Hyperlipidemia Sister    Hypertension Sister    Heart attack Sister    Heart disease Paternal Uncle    Diabetes Maternal Grandmother    Diabetes Sister      HOME MEDICATIONS: Allergies as of 09/21/2022  Reactions   Ace Inhibitors Cough   Crestor [rosuvastatin] Other (See Comments)   Muscle aches   Nitroglycerin Other (See Comments)   Very pronounced lowered BP with IV nitro for caths   Quinolones Other (See Comments)   Aortic root enlargement        Medication List        Accurate as of September 21, 2022  7:39 AM. If you have any questions, ask your nurse or doctor.          STOP taking these medications    High Potency Iron 65 MG Tabs Stopped by: Dorita Sciara, MD       TAKE these medications    Accu-Chek Guide test strip Generic drug: glucose blood USE TWICE DAILY   acetaminophen 650 MG CR tablet Commonly known as: TYLENOL Take 650 mg by mouth every 8 (eight) hours as needed for pain.   allopurinol 300 MG tablet Commonly known as: ZYLOPRIM Take 300 mg by mouth every other day. Take in the evening - on even days of the month   Aspercreme Lidocaine 4 % Generic drug: lidocaine Place 1 patch onto the skin daily as needed (pain).   atorvastatin 40 MG tablet Commonly known as: LIPITOR Take 1 tablet (40 mg total) by mouth daily.   clonazePAM 1 MG tablet Commonly known as: KLONOPIN Take 0.5 mg by mouth at bedtime.   clopidogrel 75 MG tablet Commonly known as: PLAVIX TAKE 1 TABLET BY MOUTH  DAILY   Coenzyme Q10 300 MG Caps Take 300 mg by mouth daily.   Eliquis 5 MG Tabs tablet Generic drug: apixaban TAKE 1  TABLET BY MOUTH TWICE A DAY   ezetimibe 10 MG tablet Commonly known as: ZETIA TAKE 1 TABLET BY MOUTH  DAILY   fenofibrate micronized 200 MG capsule Commonly known as: LOFIBRA Take 1 capsule (200 mg total) by mouth daily before breakfast.   fluorouracil 5 % cream Commonly known as: EFUDEX Apply 1 Application topically daily as needed (cancer spots). Use as directed   fluticasone 50 MCG/ACT nasal spray Commonly known as: FLONASE Place 2 sprays into both nostrils daily as needed for allergies.   glipiZIDE 5 MG tablet Commonly known as: GLUCOTROL Take 0.5 tablets (2.5 mg total) by mouth daily before breakfast AND 1 tablet (5 mg total) daily before supper.   levothyroxine 175 MCG tablet Commonly known as: SYNTHROID Take 175 mcg by mouth daily before breakfast.   loratadine 10 MG tablet Commonly known as: CLARITIN Take 10 mg by mouth at bedtime.   magnesium oxide 400 (240 Mg) MG tablet Commonly known as: MAG-OX Take 400 mg by mouth daily.   metoprolol tartrate 25 MG tablet Commonly known as: LOPRESSOR TAKE 1 TABLET BY MOUTH  TWICE DAILY   nitroGLYCERIN 0.4 MG SL tablet Commonly known as: Nitrostat Place 1 tablet (0.4 mg total) under the tongue every 5 (five) minutes as needed for chest pain.   olmesartan 20 MG tablet Commonly known as: BENICAR Take 20 mg by mouth daily.   pantoprazole 40 MG tablet Commonly known as: PROTONIX TAKE 1 TABLET BY MOUTH DAILY   PreserVision AREDS 2 Caps Take 1 capsule by mouth 2 (two) times daily.   SYSTANE OP Place 1 drop into both eyes daily as needed (dry/irritated eyes).   tamsulosin 0.4 MG Caps capsule Commonly known as: FLOMAX Take 0.4 mg by mouth every other day. Take in the evening - on even days of the month   traMADol 50 MG  tablet Commonly known as: ULTRAM Take 50 mg by mouth as needed.   triamcinolone cream 0.1 % Commonly known as: KENALOG Apply 1 application  topically daily as needed (rash).   TURMERIC PO Take 2  tablets by mouth in the morning and at bedtime.   Vascepa 1 g capsule Generic drug: icosapent Ethyl TAKE 2 CAPSULES BY MOUTH 2 TIMES DAILY.   VITAMIN B-12 PO Take 2,500 mcg by mouth daily.   Vitamin D3 50 MCG (2000 UT) Tabs Take 2,000 Units by mouth daily.        DM foot exam: 03/30/2022   The skin of the feet is intact without sores or ulcerations. The pedal pulses are 2+ on right and 2+ on left. The sensation is intact to a screening 5.07, 10 gram monofilament bilaterally    DATA REVIEWED:  Lab Results  Component Value Date   HGBA1C 6.6 (A) 09/21/2022   HGBA1C 6.7 (H) 06/15/2022   HGBA1C 6.7 (H) 06/13/2022    Latest Reference Range & Units 08/11/22 10:57  Alkaline Phosphatase 44 - 121 IU/L 98  Albumin 3.8 - 4.8 g/dL 4.4  AST 0 - 40 IU/L 22  ALT 0 - 44 IU/L 27  Total Protein 6.0 - 8.5 g/dL 6.9  Total Bilirubin 0.0 - 1.2 mg/dL 0.7  BILIRUBIN, DIRECT 0.00 - 0.40 mg/dL 0.27  Total CHOL/HDL Ratio 0.0 - 5.0 ratio 3.1  Cholesterol, Total 100 - 199 mg/dL 99 (L)  HDL Cholesterol >39 mg/dL 32 (L)  Triglycerides 0 - 149 mg/dL 192 (H)  VLDL Cholesterol Cal 5 - 40 mg/dL 31  LDL Chol Calc (NIH) 0 - 99 mg/dL 36   01/16/2022 BUN/Cr 23/1.70 GFR 40 Tg 361 HDL 37 LDL 57 TSH 3.45   ASSESSMENT / PLAN / RECOMMENDATIONS:   1) Type 2 Diabetes Mellitus, Optimally controlled, With CKD III and macrovascular complications - Most recent A1c of 6.6 %. Goal A1c < 7.0%.   - His A1c remains at goal - He did not qualify for patient assistance program for Farxiga - I have asked him to discuss with cardiology as the reason for discontinuation, I would still recommend being on SGLT-2 inhibitors as long as they are affordable for him as he did not qualify for pt assistance    MEDICATIONS:  Continue  glipizide 5 mg, half a tablet before Breakfast and 1 tablet before supper    EDUCATION / INSTRUCTIONS: BG monitoring instructions: Patient is instructed to check his blood sugars 2 times a  day, before breakfast and supper . Call [facility] if: BG persistently < 70  I reviewed the Rule of 15 for the treatment of hypoglycemia in detail with the patient. Literature supplied.     2) Diabetic complications:  Eye: Does not have known diabetic retinopathy.  Neuro/ Feet: Does not have known diabetic peripheral neuropathy .  Renal: Patient does  have known baseline CKD. He   is on an ACEI/ARB at present.        F/U in 6 months    Signed electronically by: Mack Guise, MD  [facility] Endocrinology  [address] [place], [place] [address] Phone: [phone] FAX: [phone]   CC: Antony Contras, MD [address] [place] [address] Phone: [phone]  Fax: [phone]  Return to Endocrinology clinic as below: Future Appointments  Date Time Provider [place]  09/22/2022  8:00 AM [facility] PHASE II EXC [facility] None  09/25/2022  8:00 AM  [facility] PHASE II EXC [facility] None  01/26/2023  9:20 AM Marlou Porch, Thana Farr, MD [facility]

## 2022-09-21 ENCOUNTER — Ambulatory Visit: Payer: Medicare Other | Admitting: Internal Medicine

## 2022-09-21 ENCOUNTER — Encounter: Payer: Self-pay | Admitting: Internal Medicine

## 2022-09-21 VITALS — BP 132/84 | HR 82 | Ht 69.5 in | Wt 199.0 lb

## 2022-09-21 DIAGNOSIS — E119 Type 2 diabetes mellitus without complications: Secondary | ICD-10-CM | POA: Insufficient documentation

## 2022-09-21 DIAGNOSIS — E1159 Type 2 diabetes mellitus with other circulatory complications: Secondary | ICD-10-CM | POA: Diagnosis not present

## 2022-09-21 DIAGNOSIS — N1832 Chronic kidney disease, stage 3b: Secondary | ICD-10-CM

## 2022-09-21 DIAGNOSIS — E1122 Type 2 diabetes mellitus with diabetic chronic kidney disease: Secondary | ICD-10-CM

## 2022-09-21 LAB — POCT GLYCOSYLATED HEMOGLOBIN (HGB A1C): Hemoglobin A1C: 6.6 % — AB (ref 4.0–5.6)

## 2022-09-21 MED ORDER — GLIPIZIDE 5 MG PO TABS: ORAL_TABLET | ORAL | 3 refills | Status: DC

## 2022-09-21 NOTE — Patient Instructions (Signed)
-   Continue  Glipizide 5 mg , HALF a tablet before Breakfast and 1 tablet before Supper       HOW TO TREAT LOW BLOOD SUGARS (Blood sugar LESS THAN 70 MG/DL) Please follow the RULE OF 15 for the treatment of hypoglycemia treatment (when your (blood sugars are less than 70 mg/dL)   STEP 1: Take 15 grams of carbohydrates when your blood sugar is low, which includes:  3-4 GLUCOSE TABS  OR 3-4 OZ OF JUICE OR REGULAR SODA OR ONE TUBE OF GLUCOSE GEL    STEP 2: RECHECK blood sugar in 15 MINUTES STEP 3: If your blood sugar is still low at the 15 minute recheck --> then, go back to STEP 1 and treat AGAIN with another 15 grams of carbohydrates.

## 2022-09-22 ENCOUNTER — Encounter (HOSPITAL_COMMUNITY)
Admission: RE | Admit: 2022-09-22 | Discharge: 2022-09-22 | Disposition: A | Discharge status: Home / Self Care | Payer: Medicare Other | Source: Ambulatory Visit | Attending: Cardiology | Admitting: Cardiology

## 2022-09-22 DIAGNOSIS — I252 Old myocardial infarction: Secondary | ICD-10-CM | POA: Diagnosis not present

## 2022-09-22 DIAGNOSIS — I2129 ST elevation (STEMI) myocardial infarction involving other sites: Secondary | ICD-10-CM

## 2022-09-22 DIAGNOSIS — Z48812 Encounter for surgical aftercare following surgery on the circulatory system: Secondary | ICD-10-CM | POA: Diagnosis not present

## 2022-09-22 NOTE — Progress Notes (Signed)
Discharge Progress Report  Patient Details  Name: Garrett HOPKIN Sr. MRN: [id number] Date of Birth: 1940-11-21 Referring Provider:   Flowsheet Row INTENSIVE CARDIAC REHAB ORIENT from 07/27/2022 in [facility]  Referring Provider Dr Candee Furbish, MD        Number of Visits: 24 exercise and 24 education sessions  Reason for Discharge:  Patient reached a stable level of exercise. Patient independent in their exercise. Patient has met program and personal goals.  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, unsuccessful PCI, SVG  ADL UCSD:   Initial Exercise Prescription:  Initial Exercise Prescription - 07/27/22 1500       Date of Initial Exercise RX and Referring Provider   Date 07/27/22    Referring Provider Dr Candee Furbish, MD    Expected Discharge Date 09/22/22      Recumbant Bike   Level 2    RPM 60    Minutes 15    METs 2.2      NuStep   Level 2    SPM 75    Minutes 15    METs 2.2      Prescription Details   Frequency (times per week) 3    Duration Progress to 30 minutes of continuous aerobic without signs/symptoms of physical distress      Intensity   THRR 40-80% of Max Heartrate 56-113    Ratings of Perceived Exertion 11-13    Perceived Dyspnea 0-4      Progression   Progression Continue progressive overload as per policy without signs/symptoms or physical distress.      Resistance Training   Training Prescription Yes    Weight 3    Reps 10-15             Discharge Exercise Prescription (Final Exercise Prescription Changes):  Exercise Prescription Changes - 09/25/22 1500       Response to Exercise   Blood Pressure (Admit) 130/70    Blood Pressure (Exercise) 130/68    Blood Pressure (Exit) 122/70    Heart Rate (Admit) 70  bpm    Heart Rate (Exercise) 102 bpm    Heart Rate (Exit) 79 bpm    Rating of Perceived Exertion (Exercise) 11    Symptoms nONE    Comments Pt graduated from the [facility] program today    Duration Continue with 30 min of aerobic exercise without signs/symptoms of physical distress.    Intensity THRR unchanged      Progression   Progression Continue to progress workloads to maintain intensity without signs/symptoms of physical distress.    Average METs 3      Resistance Training   Training Prescription Yes    Weight 5 lbs    Reps 10-15    Time 10 Minutes      Interval Training   Interval Training No      Bike   Level 1.5    Minutes 15    METs 2.41      NuStep   Level 4    SPM 1243    Minutes 15    METs 3      Home Exercise Plan   Plans to continue exercise at Home (comment)    Frequency Add 3 additional days to program exercise  sessions.    Initial Home Exercises Provided 08/21/22             Functional Capacity:  6 Minute Walk     Row Name 07/27/22 1307 09/20/22 0915       6 Minute Walk   Phase Initial Discharge    Distance 1448 feet 1867 feet    Distance % Change -- 28.94 %    Distance Feet Change -- 419 ft    Walk Time 6 minutes 6 minutes    # of Rest Breaks 0 0    MPH 2.74 3.54    METS 2.54 3.5    RPE 11 11    Perceived Dyspnea  1 1    VO2 Peak 8.9 12.1    Symptoms Yes (comment) No    Comments bilateral hip pain, 3/10. resolved with rest --    Resting HR 71 bpm 77 bpm    Resting BP 118/72 138/78    Resting Oxygen Saturation  97 % --    Exercise Oxygen Saturation  during 6 min walk 97 % --    Max Ex. HR 95 bpm 100 bpm    Max Ex. BP 140/82 154/90    2 Minute Post BP 126/70 --             Psychological, QOL, Others - Outcomes: PHQ 2/9:    09/22/2022    9:34 AM 07/27/2022   12:22 PM  Depression screen PHQ 2/9  Decreased Interest 0 0  Down, Depressed, Hopeless 0 0  PHQ - 2 Score 0 0    Quality of Life:  Quality of Life - 09/25/22  1527       Quality of Life Scores   Health/Function Pre 22.21 %    Health/Function Post 21.13 %    Health/Function % Change -4.86 %    Socioeconomic Pre 23.71 %    Socioeconomic Post 21.57 %    Socioeconomic % Change  -9.03 %    Psych/Spiritual Pre 25.57 %    Psych/Spiritual Post 24.64 %    Psych/Spiritual % Change -3.64 %    Family Pre 28.8 %    Family Post 22.8 %    Family % Change -20.83 %    GLOBAL Pre 24.16 %    GLOBAL Post 22.19 %    GLOBAL % Change -8.15 %             Personal Goals: Goals established at orientation with interventions provided to work toward goal.  Personal Goals and Risk Factors at Admission - 07/27/22 1510       Core Components/Risk Factors/Patient Goals on Admission    Weight Management Yes;Weight Maintenance    Intervention Weight Management: Develop a combined nutrition and exercise program designed to reach desired caloric intake, while maintaining appropriate intake of nutrient and fiber, sodium and fats, and appropriate energy expenditure required for the weight goal.;Weight Management: Provide education and appropriate resources to help participant work on and attain dietary goals.    Admit Weight 205 lb 14.6 oz (93.4 kg)    Diabetes Yes    Intervention Provide education about signs/symptoms and action to take for hypo/hyperglycemia.;Provide education about proper nutrition, including hydration, and aerobic/resistive exercise prescription along with prescribed medications to achieve blood glucose in normal ranges: Fasting glucose 65-99 mg/dL    Expected Outcomes Short Term: Participant verbalizes understanding of the signs/symptoms and immediate care of hyper/hypoglycemia, proper foot care and importance of medication, aerobic/resistive exercise and nutrition plan for blood  glucose control.;Long Term: Attainment of HbA1C < 7%.    Hypertension Yes    Intervention Provide education on lifestyle modifcations including regular physical  activity/exercise, weight management, moderate sodium restriction and increased consumption of fresh fruit, vegetables, and low fat dairy, alcohol moderation, and smoking cessation.;Monitor prescription use compliance.    Expected Outcomes Short Term: Continued assessment and intervention until BP is < 140/61m HG in hypertensive participants. < 130/822mHG in hypertensive participants with diabetes, heart failure or chronic kidney disease.;Long Term: Maintenance of blood pressure at goal levels.    Lipids Yes    Intervention Provide education and support for participant on nutrition & aerobic/resistive exercise along with prescribed medications to achieve LDL <7043mHDL >29m86m  Expected Outcomes Short Term: Participant states understanding of desired cholesterol values and is compliant with medications prescribed. Participant is following exercise prescription and nutrition guidelines.;Long Term: Cholesterol controlled with medications as prescribed, with individualized exercise RX and with personalized nutrition plan. Value goals: LDL < 70mg7mL > 40 mg.    Stress Yes    Intervention Offer individual and/or small group education and counseling on adjustment to heart disease, stress management and health-related lifestyle change. Teach and support self-help strategies.;Refer participants experiencing significant psychosocial distress to appropriate mental health specialists for further evaluation and treatment. When possible, include family members and significant others in education/counseling sessions.    Expected Outcomes Short Term: Participant demonstrates changes in health-related behavior, relaxation and other stress management skills, ability to obtain effective social support, and compliance with psychotropic medications if prescribed.;Long Term: Emotional wellbeing is indicated by absence of clinically significant psychosocial distress or social isolation.              Personal Goals  Discharge:  Goals and Risk Factor Review     Row Name 07/31/22 1126 08/22/22 1629 09/19/22 1546         Core Components/Risk Factors/Patient Goals Review   Personal Goals Review Weight Management/Obesity;Hypertension;Lipids;Diabetes Weight Management/Obesity;Hypertension;Lipids;Diabetes Weight Management/Obesity;Hypertension;Lipids;Diabetes     Review Tom started intensive cardiac rehab on 07/31/22 and did well with exercise. Vital signs and CBG's were stable. Tom hGershon Musselbeen doing well with exercise at  intensive cardiac rehab. Vital signs and CBG's have been  stable. Tom hGershon Musselbeen doing well with exercise at  intensive cardiac rehab. Vital signs and CBG's have been stable. Tom wGershon Mussel complete intensive cardiac rehab on 09/25/22     Expected Outcomes Tom will continue to participate in intensive cardiac rehab for exercise, Tom wGershon Mussel continue to participate in intensive cardiac rehab for exercise, nutrition and lifestyle modifications Tom wGershon Mussel continue to participate in intensive cardiac rehab for exercise, nutrition and lifestyle modifications              Exercise Goals and Review:  Exercise Goals     Row Name 07/27/22 1507             Exercise Goals   Increase Physical Activity Yes       Intervention Provide advice, education, support and counseling about physical activity/exercise needs.;Develop an individualized exercise prescription for aerobic and resistive training based on initial evaluation findings, risk stratification, comorbidities and participant's personal goals.       Expected Outcomes Short Term: Attend rehab on a regular basis to increase amount of physical activity.;Long Term: Add in home exercise to make exercise part of routine and to increase amount of physical activity.;Long Term: Exercising regularly at least 3-5 days a week.       Increase  Strength and Stamina Yes       Intervention Provide advice, education, support and counseling about physical activity/exercise  needs.;Develop an individualized exercise prescription for aerobic and resistive training based on initial evaluation findings, risk stratification, comorbidities and participant's personal goals.       Expected Outcomes Short Term: Increase workloads from initial exercise prescription for resistance, speed, and METs.;Short Term: Perform resistance training exercises routinely during rehab and add in resistance training at home;Long Term: Improve cardiorespiratory fitness, muscular endurance and strength as measured by increased METs and functional capacity (6MWT)       Able to understand and use rate of perceived exertion (RPE) scale Yes       Intervention Provide education and explanation on how to use RPE scale       Expected Outcomes Short Term: Able to use RPE daily in rehab to express subjective intensity level;Long Term:  Able to use RPE to guide intensity level when exercising independently       Knowledge and understanding of Target Heart Rate Range (THRR) Yes       Intervention Provide education and explanation of THRR including how the numbers were predicted and where they are located for reference       Expected Outcomes Short Term: Able to state/look up THRR;Short Term: Able to use daily as guideline for intensity in rehab;Long Term: Able to use THRR to govern intensity when exercising independently       Understanding of Exercise Prescription Yes       Intervention Provide education, explanation, and written materials on patient's individual exercise prescription       Expected Outcomes Short Term: Able to explain program exercise prescription;Long Term: Able to explain home exercise prescription to exercise independently                Exercise Goals Re-Evaluation:  Exercise Goals Re-Evaluation     Row Name 07/31/22 1157 08/30/22 1015 09/25/22 1540         Exercise Goal Re-Evaluation   Exercise Goals Review Increase Physical Activity;Increase Strength and Stamina;Able to  understand and use rate of perceived exertion (RPE) scale;Knowledge and understanding of Target Heart Rate Range (THRR);Understanding of Exercise Prescription Increase Physical Activity;Increase Strength and Stamina;Able to understand and use rate of perceived exertion (RPE) scale;Knowledge and understanding of Target Heart Rate Range (THRR);Understanding of Exercise Prescription Increase Physical Activity;Increase Strength and Stamina;Able to understand and use rate of perceived exertion (RPE) scale;Knowledge and understanding of Target Heart Rate Range (THRR);Understanding of Exercise Prescription     Comments Pt's first day in the [facility] program. Pt understands the exercise Rx, RPE sclae and THRR. Reviewed METs and goals. Pt with peak METs of 3.05. Pt voices imrpoved stregth and stamina which are patient goals. Pt voices that he bought an [employer] bike last night for home use. Pt will begin using this for his home exercise. Pt graduated from the [facility] program today. Pt had peak METs of 3.7. Pt voices riding his bike at home 2x/week in addtion to the [facility] program. Pt plans to continue to exercise at home on his air dyne bike and at the [facility]. 5-7 x/week for 30-45 minutes encouraged.     Expected Outcomes Will continue to monitor patient and progress exercise workloads as tolerated. Will continue to monitor patient and progress exercise workloads as tolerated. Pt will continue to exericse at home at the [facility].              Nutrition & Weight -  Outcomes:  Pre Biometrics - 07/27/22 1302       Pre Biometrics   Waist Circumference 43 inches    Hip Circumference 41.5 inches    Waist to Hip Ratio 1.04 %    Triceps Skinfold 9 mm    % Body Fat 27.8 %    Grip Strength 42 kg    Flexibility 13.5 in    Single Leg Stand 2 seconds             Post Biometrics - 09/20/22 1005        Post  Biometrics   Height 5' 9.5" (1.765 m)    Weight 90.6 kg    Waist Circumference 42 inches    Hip Circumference 39.5  inches    Waist to Hip Ratio 1.06 %    BMI (Calculated) 29.08    Triceps Skinfold 7 mm    % Body Fat 26.2 %    Grip Strength 47 kg    Flexibility 14 in    Single Leg Stand 2.29 seconds             Nutrition:  Nutrition Therapy & Goals - 09/13/22 1255       Nutrition Therapy   Diet Heart Healthy Diet    Drug/Food Interactions Statins/Certain Fruits      Personal Nutrition Goals   Nutrition Goal Patient to identify strategies for managing cardiovascular risk by attending the weekly nutiriton educatoin and nutrition courses    Personal Goal #2 Patient to identify food sources and limit daily intake of saturated fat, trans fat, sodium, and refined carbohydrates    Personal Goal #3 Patient to reduce sodium to <1519m daily    Comments Goals in progress. Per diet recall, patient reports increased vegetable intake and reduced sodium intake.     He does have room for further improvement to reduce refined carbohydrates (baked goods, biscuits, etc) and reduce sodium further. He continues Vascepa and Fenofibrate for elevated triglycerides; triglycerides remains elevated but improved.      Intervention Plan   Intervention Prescribe, educate and counsel regarding individualized specific dietary modifications aiming towards targeted core components such as weight, hypertension, lipid management, diabetes, heart failure and other comorbidities.;Nutrition handout(s) given to patient.    Expected Outcomes Short Term Goal: Understand basic principles of dietary content, such as calories, fat, sodium, cholesterol and nutrients.;Long Term Goal: Adherence to prescribed nutrition plan.             Nutrition Discharge:  Nutrition Assessments - 09/26/22 0926       Rate Your Plate Scores   Post Score 54             Education Questionnaire Score:  Knowledge Questionnaire Score - 09/25/22 1528       Knowledge Questionnaire Score   Post Score 21/24             Goals reviewed with  patient; copy given to patient. Tom graduates from  Intensive cardiac rehab program on 09/25/22  with completion of  24 exercise and  24 education sessions. Pt maintained good attendance and progressed nicely during their participation in rehab as evidenced by increased MET level.   Medication list reconciled. Repeat  PHQ score-0  .  Pt has made significant lifestyle changes and should be commended for his success. Tom achieved their goals during cardiac rehab.   [facility] increased his distance on his post exercise walk test by 419 feet. We are  proud of Tom's progress!Harrell Gave RN BSN

## 2022-09-25 ENCOUNTER — Encounter (HOSPITAL_COMMUNITY)
Admission: RE | Admit: 2022-09-25 | Discharge: 2022-09-25 | Disposition: A | Discharge status: Home / Self Care | Payer: Medicare Other | Source: Ambulatory Visit | Attending: Cardiology | Admitting: Cardiology

## 2022-09-25 DIAGNOSIS — I252 Old myocardial infarction: Secondary | ICD-10-CM | POA: Diagnosis not present

## 2022-09-25 DIAGNOSIS — Z48812 Encounter for surgical aftercare following surgery on the circulatory system: Secondary | ICD-10-CM | POA: Diagnosis not present

## 2022-09-25 DIAGNOSIS — I2129 ST elevation (STEMI) myocardial infarction involving other sites: Secondary | ICD-10-CM

## 2022-09-30 ENCOUNTER — Encounter: Payer: Self-pay | Admitting: Cardiology

## 2022-09-30 ENCOUNTER — Other Ambulatory Visit: Payer: Self-pay | Admitting: Cardiology

## 2022-10-09 ENCOUNTER — Encounter: Payer: Self-pay | Admitting: Cardiology

## 2022-10-10 ENCOUNTER — Other Ambulatory Visit: Payer: Self-pay | Admitting: Internal Medicine

## 2022-10-10 ENCOUNTER — Encounter: Payer: Self-pay | Admitting: Internal Medicine

## 2022-10-10 MED ORDER — DAPAGLIFLOZIN PROPANEDIOL 5 MG PO TABS: 5.0000 mg | ORAL_TABLET | Freq: Every day | ORAL | 2 refills | Status: DC

## 2022-10-12 ENCOUNTER — Telehealth: Payer: Self-pay

## 2022-10-12 NOTE — Telephone Encounter (Signed)
..     Pre-operative Risk Assessment    Patient Name: Garrett SHUGARS Sr.  DOB: [birth year] MRN: [id number]      Request for Surgical Clearance    Procedure:   L2-L3 LUMBAR LAMINECTOMY WITH INTERSPINOUS PLATING  Date of Surgery:  Clearance TBD                                 Surgeon:  DR Sherley Bounds Surgeon's Group or Practice Name:  [facility] Phone number:  [phone] Fax number:  [phone]   Type of Clearance Requested:   - Medical  - Pharmacy:  Hold Clopidogrel (Plavix)     Type of Anesthesia:  General    Additional requests/questions:    Gwenlyn Found   10/12/2022, 1:41 PM

## 2022-10-12 NOTE — Telephone Encounter (Signed)
Patient with diagnosis of atrial fibrillation on Eliquis for anticoagulation.    Procedure:   L2-L3 LUMBAR LAMINECTOMY WITH INTERSPINOUS PLATING   Date of Surgery:  Clearance TBD    CHA2DS2-VASc Score = 5   This indicates a 7.2% annual risk of stroke. The patient's score is based upon: CHF History: 0 HTN History: 1 Diabetes History: 1 Stroke History: 0 Vascular Disease History: 1 Age Score: 2 Gender Score: 0    CrCl 39 Platelet count 174  Per office protocol, patient can hold Eliquis for 3 days prior to procedure.   Patient will not need bridging with Lovenox (enoxaparin) around procedure.  **This guidance is not considered finalized until pre-operative APP has relayed final recommendations.**

## 2022-10-12 NOTE — Telephone Encounter (Signed)
     Primary Cardiologist: Candee Furbish, MD  Chart reviewed as part of pre-operative protocol coverage. Given past medical history and time since last visit, based on ACC/AHA guidelines, Garrett MCCASLIN Sr. would be at acceptable risk for the planned procedure without further cardiovascular testing.   Patient's Plavix may be held for 5 days prior to his procedure.  Please resume as soon as hemostasis is achieved  Patient with diagnosis of atrial fibrillation on Eliquis for anticoagulation.     Procedure:   L2-L3 LUMBAR LAMINECTOMY WITH INTERSPINOUS PLATING   Date of Surgery:  Clearance TBD      CHA2DS2-VASc Score = 5   This indicates a 7.2% annual risk of stroke. The patient's score is based upon: CHF History: 0 HTN History: 1 Diabetes History: 1 Stroke History: 0 Vascular Disease History: 1 Age Score: 2 Gender Score: 0     CrCl 39 Platelet count 174   Per office protocol, patient can hold Eliquis for 3 days prior to procedure.   Patient will not need bridging with Lovenox (enoxaparin) around procedure.  I will route this recommendation to the requesting party via Epic fax function and remove from pre-op pool.  Please call with questions.  Jossie Ng. Alsha Meland NP-C     10/12/2022, [facility] [place] [address] Office [phone] Fax [phone]

## 2022-10-17 DIAGNOSIS — M4316 Spondylolisthesis, lumbar region: Secondary | ICD-10-CM | POA: Diagnosis not present

## 2022-10-25 ENCOUNTER — Other Ambulatory Visit: Payer: Self-pay | Admitting: Neurological Surgery

## 2022-10-30 NOTE — Pre-Procedure Instructions (Addendum)
Surgical Instructions    Your procedure is scheduled on November 10, 2022.  Report to [facility] Main Entrance "A" at 12:30 P.M., then check in with the Admitting office.  Call this number if you have problems the morning of surgery:  [phone]   If you have any questions prior to your surgery date call [phone]: Open Monday-Friday 8am-4pm    Remember:  Do not eat or drink after midnight the night before your surgery      Take these medicines the morning of surgery with A SIP OF WATER:  allopurinol (ZYLOPRIM)   atorvastatin (LIPITOR)   ezetimibe (ZETIA)   fenofibrate micronized (LOFIBRA)   levothyroxine (SYNTHROID, LEVOTHROID)  metoprolol tartrate (LOPRESSOR)   pantoprazole (PROTONIX)   Tamsulosin HCl (FLOMAX)   VASCEPA     May take if needed:  acetaminophen (TYLENOL)   fluticasone (FLONASE) nasal spray   nitroGLYCERIN (NITROSTAT)   Polyethyl Glycol-Propyl Glycol (SYSTANE OP)   traMADol (ULTRAM)    STOP taking your clopidogrel (PLAVIX) five days prior to surgery. Your last dose of this medication will be December 23rd.   STOP taking your ELIQUIS three days prior to surgery. Your last dose of this medication will be December 25th.   As of today, STOP taking any Aspirin (unless otherwise instructed by your surgeon) Aleve, Naproxen, Ibuprofen, Motrin, Advil, Goody's, BC's, all herbal medications, fish oil, and all vitamins.           WHAT DO I DO ABOUT MY DIABETES MEDICATION?   Do not take glipiZIDE (GLUCOTROL) the evening before surgery or the morning of surgery.  STOP taking your dapagliflozin propanediol (FARXIGA) three days prior to surgery. Your last dose of this medication will be December 25th.   HOW TO MANAGE YOUR DIABETES BEFORE AND AFTER SURGERY  Why is it important to control my blood sugar before and after surgery? Improving blood sugar levels before and after surgery helps healing and can limit problems. A way of improving blood sugar control  is eating a healthy diet by:  Eating less sugar and carbohydrates  Increasing activity/exercise  Talking with your doctor about reaching your blood sugar goals High blood sugars (greater than 180 mg/dL) can raise your risk of infections and slow your recovery, so you will need to focus on controlling your diabetes during the weeks before surgery. Make sure that the doctor who takes care of your diabetes knows about your planned surgery including the date and location.  How do I manage my blood sugar before surgery? Check your blood sugar at least 4 times a day, starting 2 days before surgery, to make sure that the level is not too high or low.  Check your blood sugar the morning of your surgery when you wake up and every 2 hours until you get to the Short Stay unit.  If your blood sugar is less than 70 mg/dL, you will need to treat for low blood sugar: Do not take insulin. Treat a low blood sugar (less than 70 mg/dL) with  cup of clear juice (cranberry or apple), 4 glucose tablets, OR glucose gel. Recheck blood sugar in 15 minutes after treatment (to make sure it is greater than 70 mg/dL). If your blood sugar is not greater than 70 mg/dL on recheck, call [phone] for further instructions. Report your blood sugar to the short stay nurse when you get to Short Stay.  If you are admitted to the hospital after surgery: Your blood sugar will be checked by the staff and  you will probably be given insulin after surgery (instead of oral diabetes medicines) to make sure you have good blood sugar levels. The goal for blood sugar control after surgery is 80-180 mg/dL.             Do NOT Smoke (Tobacco/Vaping) for 24 hours prior to your procedure.  If you use a CPAP at night, you may bring your mask/headgear for your overnight stay.   Contacts, glasses, piercing's, hearing aid's, dentures or partials may not be worn into surgery, please bring cases for these belongings.    For patients admitted  to the hospital, discharge time will be determined by your treatment team.   Patients discharged the day of surgery will not be allowed to drive home, and someone needs to stay with them for 24 hours.  SURGICAL WAITING ROOM VISITATION Patients having surgery or a procedure may have no more than 2 support people in the waiting area - these visitors may rotate.   Children under the age of 60 must have an adult with them who is not the patient. If the patient needs to stay at the hospital during part of their recovery, the visitor guidelines for inpatient rooms apply. Pre-op nurse will coordinate an appropriate time for 1 support person to accompany patient in pre-op.  This support person may not rotate.   Please refer to the [facility] website for the visitor guidelines for Inpatients (after your surgery is over and you are in a regular room).    Special instructions:   - Preparing For Surgery  Before surgery, you can play an important role. Because skin is not sterile, your skin needs to be as free of germs as possible. You can reduce the number of germs on your skin by washing with CHG (chlorahexidine gluconate) Soap before surgery.  CHG is an antiseptic cleaner which kills germs and bonds with the skin to continue killing germs even after washing.    Oral Hygiene is also important to reduce your risk of infection.  Remember - BRUSH YOUR TEETH THE MORNING OF SURGERY WITH YOUR REGULAR TOOTHPASTE  Please do not use if you have an allergy to CHG or antibacterial soaps. If your skin becomes reddened/irritated stop using the CHG.  Do not shave (including legs and underarms) for at least 48 hours prior to first CHG shower. It is OK to shave your face.  Please follow these instructions carefully.   Shower the NIGHT BEFORE SURGERY and the MORNING OF SURGERY  If you chose to wash your hair, wash your hair first as usual with your normal shampoo.  After you shampoo, rinse your hair and  body thoroughly to remove the shampoo.  Use CHG Soap as you would any other liquid soap. You can apply CHG directly to the skin and wash gently with a scrungie or a clean washcloth.   Apply the CHG Soap to your body ONLY FROM THE NECK DOWN.  Do not use on open wounds or open sores. Avoid contact with your eyes, ears, mouth and genitals (private parts). Wash Face and genitals (private parts)  with your normal soap.   Wash thoroughly, paying special attention to the area where your surgery will be performed.  Thoroughly rinse your body with warm water from the neck down.  DO NOT shower/wash with your normal soap after using and rinsing off the CHG Soap.  Pat yourself dry with a CLEAN TOWEL.  Wear CLEAN PAJAMAS to bed the night before surgery  Place  CLEAN SHEETS on your bed the night before your surgery  DO NOT SLEEP WITH PETS.   Day of Surgery: Take a shower with CHG soap. Do not wear jewelry or makeup Do not wear lotions, powders, perfumes/colognes, or deodorant. Do not shave 48 hours prior to surgery.  Men may shave face and neck. Do not bring valuables to the hospital.  [facility] is not responsible for any belongings or valuables. Do not wear nail polish, gel polish, artificial nails, or any other type of covering on natural nails (fingers and toes) If you have artificial nails or gel coating that need to be removed by a nail salon, please have this removed prior to surgery. Artificial nails or gel coating may interfere with anesthesia's ability to adequately monitor your vital signs.  Wear Clean/Comfortable clothing the morning of surgery Remember to brush your teeth WITH YOUR REGULAR TOOTHPASTE.   Please read over the following fact sheets that you were given.    If you received a COVID test during your pre-op visit  it is requested that you wear a mask when out in public, stay away from anyone that may not be feeling well and notify your surgeon if you develop symptoms. If  you have been in contact with anyone that has tested positive in the last 10 days please notify you surgeon.

## 2022-10-31 ENCOUNTER — Encounter: Payer: Self-pay | Admitting: Cardiology

## 2022-10-31 ENCOUNTER — Other Ambulatory Visit: Payer: Self-pay

## 2022-10-31 ENCOUNTER — Encounter (HOSPITAL_COMMUNITY): Payer: Self-pay

## 2022-10-31 ENCOUNTER — Encounter (HOSPITAL_COMMUNITY)
Admission: RE | Admit: 2022-10-31 | Discharge: 2022-10-31 | Disposition: A | Discharge status: Home / Self Care | Payer: Medicare Other | Source: Ambulatory Visit | Attending: Neurological Surgery | Admitting: Neurological Surgery

## 2022-10-31 VITALS — BP 158/111 | HR 72 | Temp 97.5°F | Resp 18 | Ht 69.0 in | Wt 197.8 lb

## 2022-10-31 DIAGNOSIS — Z7984 Long term (current) use of oral hypoglycemic drugs: Secondary | ICD-10-CM | POA: Insufficient documentation

## 2022-10-31 DIAGNOSIS — Z1152 Encounter for screening for COVID-19: Secondary | ICD-10-CM | POA: Diagnosis not present

## 2022-10-31 DIAGNOSIS — I2510 Atherosclerotic heart disease of native coronary artery without angina pectoris: Secondary | ICD-10-CM

## 2022-10-31 DIAGNOSIS — Z789 Other specified health status: Secondary | ICD-10-CM

## 2022-10-31 DIAGNOSIS — Z7901 Long term (current) use of anticoagulants: Secondary | ICD-10-CM | POA: Insufficient documentation

## 2022-10-31 DIAGNOSIS — E119 Type 2 diabetes mellitus without complications: Secondary | ICD-10-CM | POA: Diagnosis not present

## 2022-10-31 DIAGNOSIS — Z01812 Encounter for preprocedural laboratory examination: Secondary | ICD-10-CM | POA: Diagnosis not present

## 2022-10-31 DIAGNOSIS — Z01818 Encounter for other preprocedural examination: Secondary | ICD-10-CM

## 2022-10-31 HISTORY — DX: Dyspnea, unspecified: R06.00

## 2022-10-31 LAB — CBC
HCT: 44.4 % (ref 39.0–52.0)
Hemoglobin: 14.8 g/dL (ref 13.0–17.0)
MCH: 29.9 pg (ref 26.0–34.0)
MCHC: 33.3 g/dL (ref 30.0–36.0)
MCV: 89.7 fL (ref 80.0–100.0)
Platelets: 144 10*3/uL — ABNORMAL LOW (ref 150–400)
RBC: 4.95 MIL/uL (ref 4.22–5.81)
RDW: 13.4 % (ref 11.5–15.5)
WBC: 5.3 10*3/uL (ref 4.0–10.5)
nRBC: 0 % (ref 0.0–0.2)

## 2022-10-31 LAB — BASIC METABOLIC PANEL
Anion gap: 11 (ref 5–15)
BUN: 16 mg/dL (ref 8–23)
CO2: 22 mmol/L (ref 22–32)
Calcium: 9.8 mg/dL (ref 8.9–10.3)
Chloride: 104 mmol/L (ref 98–111)
Creatinine, Ser: 1.65 mg/dL — ABNORMAL HIGH (ref 0.61–1.24)
GFR, Estimated: 42 mL/min — ABNORMAL LOW (ref >60)
Glucose, Bld: 148 mg/dL — ABNORMAL HIGH (ref 70–99)
Potassium: 4.1 mmol/L (ref 3.5–5.1)
Sodium: 137 mmol/L (ref 135–145)

## 2022-10-31 LAB — TYPE AND SCREEN
ABO/RH(D): O POS
Antibody Screen: NEGATIVE

## 2022-10-31 LAB — PROTIME-INR
INR: 1.8 — ABNORMAL HIGH (ref 0.8–1.2)
Prothrombin Time: 20.4 seconds — ABNORMAL HIGH (ref 11.4–15.2)

## 2022-10-31 LAB — SARS CORONAVIRUS 2 (TAT 6-24 HRS): SARS Coronavirus 2: NEGATIVE

## 2022-10-31 LAB — SURGICAL PCR SCREEN
MRSA, PCR: NEGATIVE
Staphylococcus aureus: NEGATIVE

## 2022-10-31 LAB — GLUCOSE, CAPILLARY: Glucose-Capillary: 155 mg/dL — ABNORMAL HIGH (ref 70–99)

## 2022-10-31 NOTE — Progress Notes (Addendum)
PCP - Dr. Antony Contras Cardiologist - Dr. Candee Furbish  PPM/ICD - Denies Device Orders - n/a Rep Notified - n/a  Chest x-ray - 08/09/2022 EKG - 08/10/2022 Stress Test - 05/31/2022 ECHO - 06/15/2022 Cardiac Cath - 06/15/2022  Sleep Study - Denies CPAP - n.a  Pt is DM2. He checks his blood sugar twice a day. Normal fasting range in 130-140s. CBG at PAT appointment 155. Pt has had nothing but water today.  Last dose of GLP1 agonist- n/a   GLP1 instructions: n/a  Blood Thinner Instructions: Pt will hold Eliquis x3 days. LD will be 12/25. He will hold Plavix x5 days. LD will be 12/23 Aspirin Instructions: Per pt, Dr. Ronnald Ramp said he is okay with patient starting ASA when he is off the Eliquis. Pt instructed to call cardiologist and get confirmation that it is okay to start ASA. If it is, pt needs to confirm with Dr. Ronnald Ramp if he should take it on DOS.  NPO after midnight  COVID TEST- Yes. Pt endorses new cough and runny nose overnight. Discussed with Geanie Berlin, RN and Dr. Roderic Palau with anesthesia. Decision was made to test pt. Result pending. Pt instructed to notify the surgeon and the hospital if his respiratory symptoms get worse./do not improve by the end of the week.   Anesthesia review: Yes. Cardiac Clearance. Abnormal BMP and PT-INR results  Patient denies shortness of breath, fever, and chest pain at PAT appointment   All instructions explained to the patient, with a verbal understanding of the material. Patient agrees to go over the instructions while at home for a better understanding. Patient also instructed to self quarantine after being tested for COVID-19. The opportunity to ask questions was provided.

## 2022-11-01 DIAGNOSIS — M4316 Spondylolisthesis, lumbar region: Secondary | ICD-10-CM | POA: Diagnosis not present

## 2022-11-01 DIAGNOSIS — R000 Tachycardia, unspecified: Secondary | ICD-10-CM | POA: Diagnosis not present

## 2022-11-01 DIAGNOSIS — R0689 Other abnormalities of breathing: Secondary | ICD-10-CM | POA: Diagnosis not present

## 2022-11-01 DIAGNOSIS — Z743 Need for continuous supervision: Secondary | ICD-10-CM | POA: Diagnosis not present

## 2022-11-01 NOTE — Anesthesia Preprocedure Evaluation (Addendum)
Anesthesia Evaluation  Patient identified by MRN, date of birth, ID band Patient awake    Reviewed: Allergy & Precautions, NPO status , Patient's Chart, lab work & pertinent test results  Airway Mallampati: II  TM Distance: >3 FB Neck ROM: Full    Dental no notable dental hx. (+) Teeth Intact, Dental Advisory Given   Pulmonary former smoker   Pulmonary exam normal breath sounds clear to auscultation       Cardiovascular hypertension, + CAD, + Past MI (06/2022), + Cardiac Stents, + CABG (x5 in 2010) and + Peripheral Vascular Disease  Normal cardiovascular exam+ dysrhythmias Atrial Fibrillation  Rhythm:Regular Rate:Normal   1. Left ventricular ejection fraction, by estimation, is 55 to 60%. The  left ventricle has normal function. The left ventricle demonstrates global  hypokinesis. There is mild concentric left ventricular hypertrophy. Left  ventricular diastolic parameters  are indeterminate.   2. Right ventricular systolic function is mildly reduced. The right  ventricular size is normal.   3. Left atrial size was moderately dilated.   4. Right atrial size was moderately dilated.   5. The mitral valve is normal in structure. No evidence of mitral valve  regurgitation. No evidence of mitral stenosis.   6. Tricuspid valve regurgitation is moderate.   7. The aortic valve is normal in structure. Aortic valve regurgitation is  not visualized. No aortic stenosis is present.   8. Aneurysm of the ascending aorta, measuring 44 mm. There is mild  dilatation of the aortic root, measuring 39 mm.   9. The inferior vena cava is normal in size with greater than 50%  respiratory variability, suggesting right atrial pressure of 3 mmHg.     Neuro/Psych    GI/Hepatic ,GERD  ,,  Endo/Other  diabetes, Type 2Hypothyroidism    Renal/GU Renal InsufficiencyRenal diseaseLab Results      Component                Value               Date                       CREATININE               1.65 (H)            10/31/2022                 K                        4.1                 10/31/2022                    Musculoskeletal  (+) Arthritis ,    Abdominal   Peds  Hematology Lab Result              HGB                      14.8                10/31/2022                HCT                      44.4                10/31/2022  PLT                      144 (L)             10/31/2022              Anesthesia Other Findings All: Quinolones, crestor, ace inhibitors   Reproductive/Obstetrics                             Anesthesia Physical Anesthesia Plan  ASA: 3  Anesthesia Plan: General   Post-op Pain Management:    Induction: Intravenous  PONV Risk Score and Plan: 3 and Treatment may vary due to age or medical condition, Ondansetron and Dexamethasone  Airway Management Planned: Oral ETT  Additional Equipment: Arterial line  Intra-op Plan:   Post-operative Plan: Extubation in OR  Informed Consent: I have reviewed the patients History and Physical, chart, labs and discussed the procedure including the risks, benefits and alternatives for the proposed anesthesia with the patient or authorized representative who has indicated his/her understanding and acceptance.     Dental advisory given  Plan Discussed with: CRNA, Anesthesiologist and Surgeon  Anesthesia Plan Comments: (PAT note by Karoline Caldwell, PA-C:  Follows with cardiology for hx of afib on eliquis, HTN, HLD, CAD s/p CABG x5 in 2010. He was scheduled for back surgery in August 2023 but was admitted the day before with chest pressure and ruled in for a STEMI. Catheterization showed acute occlusion of the saphenous vein graft to the left posterolateral branch which could not be opened.   He had followup on 08/15/22 with Dr. Marlou Porch to discuss this further. Per note, "- Complicated situation. Prior Plavix hold and Eliquis hold  resulted in ST elevation myocardial infarction of a small posterior lateral branch of the RCA vein graft. Unsuccessful PCI. Cardiac catheterization reviewed. Personally discussed with interventional list on his case, Dr. Ali Lowe.We are going to continue his Plavix for a total of 3 months. At that point, it would be reasonable to once again attempt back surgery. He had a low risk nuclear stress test prior to his STEMI. This indicates that the posterior lateral branch involved was very small in territory and overall low risk. His EF also was preserved at 60%. He may proceed with moderate overall cardiac risk. He understands that there may be potential chance again for thrombosis to occur. In consultation with interventional team, he would not require Lovenox or to be 3A inhibitor/cangrelor etc. prior to surgery. He understands his potential risks but also understands that his back pain is severe. He is willing to proceed."  Cardiac clearance per telephone encounter 10/12/22 by Coletta Memos, NP states, "Chart reviewed as part of pre-operative protocol coverage. Given past medical history and time since last visit, based on ACC/AHA guidelines,Dakari J Speegle Sr.would be at acceptable risk for the planned procedure without further cardiovascular testing. Patient's Plavix may be held for 5 days prior to his procedure. 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 Last seen by Dr. Cyndia Bent 09/06/22, stable at that time, no intervention recommended, 1 year followup.  Pt reports LD Eliquis 12/25. LD Plavix 12/23. Pt reports Dr. Ronnald Ramp okay with him being on ASA.  Hx of EVAR for AAA in 2013.  Hx of CKD 3.  NIDDM2, A1c 6.6 on  09/21/22.  Preop labs reviewed, creatinine elevated 1.65 consistent with CKD 3, platelets mildly low at 144k.  Blood pressure noted to be  elevated at PAT. Review of recent encounters shows it to generally be under better control.  BP Readings from Last 3 Encounters: 10/31/22 (!) 158/111 09/21/22 132/84 09/06/22 (!) 150/80  EKG 08/09/22: afib. Rate 77. RBBB  CTA chest/abdomen/pelvis 08/22/22: IMPRESSION: 1. 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 This recommendation follows 2010 ACCF/AHA/AATS/ACR/ASA/SCA/SCAI/SIR/STS/SVM Guidelines for the Diagnosis and Management of Patients with Thoracic Aortic Disease. Circulation. 2010; 121: E751-Z001. Aortic aneurysm NOS (ICD10-I71.9) 2. 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 3. 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 The aortic aneurysm sac is stable in size and there is no evidence for an endoleak. Changes compatible with previous type 2 endoleak repair. 4. 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 Decreased mural thrombus involving the saccular aneurysm as described. 5. Evidence for stenoses involving coronary saphenous vein grafts. Recommend correlation with recent left heart catheterization from 06/15/2022. 6. No acute abnormality in the chest, abdomen or pelvis. 7. Incidental findings include cholelithiasis and nonobstructive bilateral nephrolithiasis.  TTE 06/15/2022: 1. Left ventricular ejection fraction, by estimation, is 55 to 60%. The  left ventricle has normal function. The left ventricle demonstrates global  hypokinesis. There is mild concentric left ventricular hypertrophy. Left  ventricular diastolic parameters  are indeterminate.  2. Right ventricular systolic function is mildly reduced. The right  ventricular size is normal.  3. Left atrial size was moderately dilated.  4. Right atrial size was moderately dilated.  5. The mitral valve is normal in  structure. No evidence of mitral valve  regurgitation. No evidence of mitral stenosis.  6. Tricuspid valve regurgitation is moderate.  7. The aortic valve is normal in structure. Aortic valve regurgitation is  not visualized. No aortic stenosis is present.  8. Aneurysm of the ascending aorta, measuring 44 mm. There is mild  dilatation of the aortic root, measuring 39 mm.  9. The inferior vena cava is normal in size with greater than 50%  respiratory variability, suggesting right atrial pressure of 3 mmHg.   Cath 06/15/22: 1.  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  Status post CABG with continued patency of the LIMA to LAD, saphenous vein graft to acute marginal, and saphenous vein graft to PDA with extensive diffuse degenerative changes. 3.  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  Acute occlusion of the saphenous vein graft to left posterolateral branch with extensive degenerative changes and unsuccessful PCI due to inability to restore TIMI-3 flow in an extensively degenerated graft 5.  Normal LVEDP   Plan: Patient loaded with clopidogrel 600 mg prior to the procedure, would continue at least 3 months and he will need to delay his planned back surgery for this duration.  Resume apixaban tomorrow morning.  Check 2D echocardiogram.  Post MI CV-ICU care, anticipate hospital discharge in 48 hours if no complications arise.  )        Anesthesia Quick Evaluation

## 2022-11-01 NOTE — Progress Notes (Signed)
Anesthesia Chart Review:  Follows with cardiology for hx of afib on eliquis, HTN, HLD, CAD s/p CABG x5 in 2010. He was scheduled for back surgery in August 2023 but was admitted the day before with chest pressure and ruled in for a STEMI. Catheterization showed acute occlusion of the saphenous vein graft to the left posterolateral branch which could not be opened.   He had followup on 08/15/22 with Garrett Jordan to discuss this further. Per note, "- Complicated situation.  Prior Plavix hold and Eliquis hold resulted in ST elevation myocardial infarction of a small posterior lateral branch of the RCA vein graft.  Unsuccessful PCI.  Cardiac catheterization reviewed.  Personally discussed with interventional list on his case, Garrett Jordan.  We are going to continue his Plavix for a total of 3 months.  At that point, it would be reasonable to once again attempt back surgery.  He had a low risk nuclear stress test prior to his STEMI.  This indicates that the posterior lateral branch involved was very small in territory and overall low risk.  His EF also was preserved at 60%.  He may proceed with moderate overall cardiac risk.  He understands that there may be potential chance again for thrombosis to occur.  In consultation with interventional team, he would not require Lovenox or to be 3A inhibitor/cangrelor etc. prior to surgery. He understands his potential risks but also understands that his back pain is severe.  He is willing to proceed."  Cardiac clearance per telephone encounter 10/12/22 by Garrett Memos, NP states, "Chart reviewed as part of pre-operative protocol coverage. Given past medical history and time since last visit, based on ACC/AHA guidelines, Garrett GAISER Sr. would be at acceptable risk for the planned procedure without further cardiovascular testing. Patient's Plavix may be held for 5 days prior to his procedure.  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 Last seen by Garrett Jordan 09/06/22, stable at that time, no intervention recommended, 1 year followup.  Pt reports LD Eliquis 12/25. LD Plavix 12/23. Pt reports Garrett Jordan okay with him being on ASA.  Hx of EVAR for AAA in 2013.  Hx of CKD 3.  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  Preop labs reviewed, creatinine elevated 1.65 consistent with CKD 3, platelets mildly low at 144k.  Blood pressure noted to be elevated at PAT. Review of recent encounters shows it to generally be under better control.  BP Readings from Last 3 Encounters:  10/31/22 (!) 158/111  09/21/22 132/84  09/06/22 (!) 150/80   EKG 08/09/22: afib. Rate 77. RBBB  CTA chest/abdomen/pelvis 08/22/22: IMPRESSION: 1. 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 This recommendation follows 2010 ACCF/AHA/AATS/ACR/ASA/SCA/SCAI/SIR/STS/SVM Guidelines for the Diagnosis and Management of Patients with Thoracic Aortic Disease. Circulation. 2010; 121: U725-D664. Aortic aneurysm NOS (ICD10-I71.9) 2. 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 3. 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 The aortic aneurysm sac is stable in size and there is no evidence for an endoleak. Changes compatible with previous type 2 endoleak repair. 4. 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 Decreased mural thrombus involving the saccular aneurysm as described. 5. Evidence for stenoses involving coronary saphenous vein grafts. Recommend correlation  with recent left heart catheterization from 06/15/2022. 6. No acute abnormality in the chest, abdomen or pelvis. 7. Incidental findings include cholelithiasis and nonobstructive bilateral nephrolithiasis.  TTE 06/15/2022:  1. Left  ventricular ejection fraction, by estimation, is 55 to 60%. The  left ventricle has normal function. The left ventricle demonstrates global  hypokinesis. There is mild concentric left ventricular hypertrophy. Left  ventricular diastolic parameters  are indeterminate.   2. Right ventricular systolic function is mildly reduced. The right  ventricular size is normal.   3. Left atrial size was moderately dilated.   4. Right atrial size was moderately dilated.   5. The mitral valve is normal in structure. No evidence of mitral valve  regurgitation. No evidence of mitral stenosis.   6. Tricuspid valve regurgitation is moderate.   7. The aortic valve is normal in structure. Aortic valve regurgitation is  not visualized. No aortic stenosis is present.   8. Aneurysm of the ascending aorta, measuring 44 mm. There is mild  dilatation of the aortic root, measuring 39 mm.   9. The inferior vena cava is normal in size with greater than 50%  respiratory variability, suggesting right atrial pressure of 3 mmHg.   Cath 06/15/22: 1.  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  Status post CABG with continued patency of the LIMA to LAD, saphenous vein graft to acute marginal, and saphenous vein graft to PDA with extensive diffuse degenerative changes. 3.  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  Acute occlusion of the saphenous vein graft to left posterolateral branch with extensive degenerative changes and unsuccessful PCI due to inability to restore TIMI-3 flow in an extensively degenerated graft 5.  Normal LVEDP   Plan: Patient loaded with clopidogrel 600 mg prior to the procedure, would continue at least 3 months and he will need to delay his planned back surgery for this duration.  Resume apixaban tomorrow morning.  Check 2D  echocardiogram.  Post MI CV-ICU care, anticipate hospital discharge in 48 hours if no complications arise.    Garrett Musty [facility] Stay [facility]/Anesthesiology Phone [phone] 11/01/2022 2:47 PM

## 2022-11-04 DIAGNOSIS — R5383 Other fatigue: Secondary | ICD-10-CM | POA: Diagnosis not present

## 2022-11-04 DIAGNOSIS — J029 Acute pharyngitis, unspecified: Secondary | ICD-10-CM | POA: Diagnosis not present

## 2022-11-04 DIAGNOSIS — R051 Acute cough: Secondary | ICD-10-CM | POA: Diagnosis not present

## 2022-11-04 DIAGNOSIS — Z03818 Encounter for observation for suspected exposure to other biological agents ruled out: Secondary | ICD-10-CM | POA: Diagnosis not present

## 2022-11-09 NOTE — Progress Notes (Signed)
Left a message to pt's voicemail for the time change but I was able to talk to his son, Vernard Gram. Pt will arrive tom at 0945.

## 2022-11-10 ENCOUNTER — Other Ambulatory Visit: Payer: Self-pay

## 2022-11-10 ENCOUNTER — Observation Stay (HOSPITAL_COMMUNITY)
Admission: RE | Admit: 2022-11-10 | Discharge: 2022-11-11 | Disposition: A | Discharge status: Home / Self Care | Payer: Medicare Other | Attending: Neurological Surgery | Admitting: Neurological Surgery

## 2022-11-10 ENCOUNTER — Ambulatory Visit (HOSPITAL_COMMUNITY): Payer: Medicare Other | Admitting: Physician Assistant

## 2022-11-10 ENCOUNTER — Ambulatory Visit (HOSPITAL_COMMUNITY): Payer: Medicare Other

## 2022-11-10 ENCOUNTER — Encounter (HOSPITAL_COMMUNITY)
Admission: RE | Disposition: A | Discharge status: Home / Self Care | Payer: Self-pay | Source: Home / Self Care | Attending: Neurological Surgery

## 2022-11-10 ENCOUNTER — Ambulatory Visit (HOSPITAL_BASED_OUTPATIENT_CLINIC_OR_DEPARTMENT_OTHER): Payer: Medicare Other | Admitting: Physician Assistant

## 2022-11-10 ENCOUNTER — Encounter (HOSPITAL_COMMUNITY): Payer: Self-pay | Admitting: Neurological Surgery

## 2022-11-10 DIAGNOSIS — Z7901 Long term (current) use of anticoagulants: Secondary | ICD-10-CM | POA: Diagnosis not present

## 2022-11-10 DIAGNOSIS — Z7902 Long term (current) use of antithrombotics/antiplatelets: Secondary | ICD-10-CM | POA: Diagnosis not present

## 2022-11-10 DIAGNOSIS — Z981 Arthrodesis status: Secondary | ICD-10-CM | POA: Diagnosis not present

## 2022-11-10 DIAGNOSIS — I10 Essential (primary) hypertension: Secondary | ICD-10-CM

## 2022-11-10 DIAGNOSIS — E039 Hypothyroidism, unspecified: Secondary | ICD-10-CM | POA: Diagnosis not present

## 2022-11-10 DIAGNOSIS — I252 Old myocardial infarction: Secondary | ICD-10-CM

## 2022-11-10 DIAGNOSIS — Z951 Presence of aortocoronary bypass graft: Secondary | ICD-10-CM | POA: Insufficient documentation

## 2022-11-10 DIAGNOSIS — I2510 Atherosclerotic heart disease of native coronary artery without angina pectoris: Secondary | ICD-10-CM | POA: Diagnosis not present

## 2022-11-10 DIAGNOSIS — Z79899 Other long term (current) drug therapy: Secondary | ICD-10-CM | POA: Diagnosis not present

## 2022-11-10 DIAGNOSIS — Z87891 Personal history of nicotine dependence: Secondary | ICD-10-CM

## 2022-11-10 DIAGNOSIS — M4316 Spondylolisthesis, lumbar region: Secondary | ICD-10-CM

## 2022-11-10 DIAGNOSIS — Z8616 Personal history of COVID-19: Secondary | ICD-10-CM | POA: Insufficient documentation

## 2022-11-10 DIAGNOSIS — Z955 Presence of coronary angioplasty implant and graft: Secondary | ICD-10-CM | POA: Diagnosis not present

## 2022-11-10 DIAGNOSIS — N183 Chronic kidney disease, stage 3 unspecified: Secondary | ICD-10-CM | POA: Diagnosis not present

## 2022-11-10 DIAGNOSIS — I129 Hypertensive chronic kidney disease with stage 1 through stage 4 chronic kidney disease, or unspecified chronic kidney disease: Secondary | ICD-10-CM | POA: Diagnosis not present

## 2022-11-10 DIAGNOSIS — I4891 Unspecified atrial fibrillation: Secondary | ICD-10-CM | POA: Diagnosis not present

## 2022-11-10 DIAGNOSIS — Z8546 Personal history of malignant neoplasm of prostate: Secondary | ICD-10-CM | POA: Diagnosis not present

## 2022-11-10 DIAGNOSIS — M48062 Spinal stenosis, lumbar region with neurogenic claudication: Secondary | ICD-10-CM

## 2022-11-10 DIAGNOSIS — M4036 Flatback syndrome, lumbar region: Secondary | ICD-10-CM | POA: Diagnosis not present

## 2022-11-10 DIAGNOSIS — E119 Type 2 diabetes mellitus without complications: Secondary | ICD-10-CM

## 2022-11-10 DIAGNOSIS — E1122 Type 2 diabetes mellitus with diabetic chronic kidney disease: Secondary | ICD-10-CM | POA: Insufficient documentation

## 2022-11-10 DIAGNOSIS — Z7984 Long term (current) use of oral hypoglycemic drugs: Secondary | ICD-10-CM | POA: Diagnosis not present

## 2022-11-10 HISTORY — PX: LAMINECTOMY WITH POSTERIOR LATERAL ARTHRODESIS LEVEL 1: SHX6335

## 2022-11-10 LAB — PROTIME-INR
INR: 1.3 — ABNORMAL HIGH (ref 0.8–1.2)
Prothrombin Time: 16.2 seconds — ABNORMAL HIGH (ref 11.4–15.2)

## 2022-11-10 LAB — GLUCOSE, CAPILLARY
Glucose-Capillary: 147 mg/dL — ABNORMAL HIGH (ref 70–99)
Glucose-Capillary: 169 mg/dL — ABNORMAL HIGH (ref 70–99)
Glucose-Capillary: 239 mg/dL — ABNORMAL HIGH (ref 70–99)

## 2022-11-10 SURGERY — LAMINECTOMY WITH POSTERIOR LATERAL ARTHRODESIS LEVEL 1
Anesthesia: General | Site: Spine Lumbar | Laterality: Bilateral

## 2022-11-10 MED ORDER — DEXAMETHASONE SODIUM PHOSPHATE 10 MG/ML IJ SOLN
INTRAMUSCULAR | Status: AC
  Filled 2022-11-10: qty 1

## 2022-11-10 MED ORDER — FENOFIBRATE 200 MG PO CAPS: 200.0000 mg | ORAL_CAPSULE | Freq: Every day | ORAL | Status: DC

## 2022-11-10 MED ORDER — ACETAMINOPHEN 500 MG PO TABS: 1000.0000 mg | ORAL_TABLET | ORAL | Status: DC

## 2022-11-10 MED ORDER — LIDOCAINE 2% (20 MG/ML) 5 ML SYRINGE
INTRAMUSCULAR | Status: DC | PRN
  Administered 2022-11-10: 80 mg via INTRAVENOUS

## 2022-11-10 MED ORDER — METHOCARBAMOL 1000 MG/10ML IJ SOLN: 500.0000 mg | Freq: Four times a day (QID) | INTRAVENOUS | Status: DC | PRN

## 2022-11-10 MED ORDER — ALLOPURINOL 300 MG PO TABS
300.0000 mg | ORAL_TABLET | ORAL | Status: DC
  Administered 2022-11-11: 300 mg via ORAL
  Filled 2022-11-10: qty 1

## 2022-11-10 MED ORDER — ACETAMINOPHEN 500 MG PO TABS
1000.0000 mg | ORAL_TABLET | Freq: Four times a day (QID) | ORAL | Status: DC
  Administered 2022-11-10 – 2022-11-11 (×3): 1000 mg via ORAL
  Filled 2022-11-10 (×3): qty 2

## 2022-11-10 MED ORDER — LEVOTHYROXINE SODIUM 75 MCG PO TABS
175.0000 ug | ORAL_TABLET | Freq: Every day | ORAL | Status: DC
  Administered 2022-11-11: 175 ug via ORAL
  Filled 2022-11-10: qty 1

## 2022-11-10 MED ORDER — SODIUM CHLORIDE 0.9% FLUSH: 3.0000 mL | INTRAVENOUS | Status: DC | PRN

## 2022-11-10 MED ORDER — PHENYLEPHRINE 80 MCG/ML (10ML) SYRINGE FOR IV PUSH (FOR BLOOD PRESSURE SUPPORT)
PREFILLED_SYRINGE | INTRAVENOUS | Status: AC
  Filled 2022-11-10: qty 10

## 2022-11-10 MED ORDER — CEFAZOLIN SODIUM-DEXTROSE 2-4 GM/100ML-% IV SOLN
2.0000 g | INTRAVENOUS | Status: AC
  Administered 2022-11-10: 2 g via INTRAVENOUS
  Filled 2022-11-10: qty 100

## 2022-11-10 MED ORDER — SODIUM CHLORIDE 0.9 % IV SOLN: INTRAVENOUS | Status: DC | PRN

## 2022-11-10 MED ORDER — LACTATED RINGERS IV SOLN: INTRAVENOUS | Status: DC

## 2022-11-10 MED ORDER — FENOFIBRATE 160 MG PO TABS
160.0000 mg | ORAL_TABLET | Freq: Every day | ORAL | Status: DC
  Administered 2022-11-11: 160 mg via ORAL
  Filled 2022-11-10: qty 1

## 2022-11-10 MED ORDER — OXYCODONE HCL 5 MG PO TABS: 5.0000 mg | ORAL_TABLET | Freq: Once | ORAL | Status: DC | PRN

## 2022-11-10 MED ORDER — ONDANSETRON HCL 4 MG PO TABS: 4.0000 mg | ORAL_TABLET | Freq: Four times a day (QID) | ORAL | Status: DC | PRN

## 2022-11-10 MED ORDER — PROPOFOL 10 MG/ML IV BOLUS
INTRAVENOUS | Status: AC
  Filled 2022-11-10: qty 20

## 2022-11-10 MED ORDER — CELECOXIB 200 MG PO CAPS
200.0000 mg | ORAL_CAPSULE | Freq: Two times a day (BID) | ORAL | Status: DC
  Administered 2022-11-10 – 2022-11-11 (×2): 200 mg via ORAL
  Filled 2022-11-10 (×2): qty 1

## 2022-11-10 MED ORDER — ROCURONIUM BROMIDE 10 MG/ML (PF) SYRINGE
PREFILLED_SYRINGE | INTRAVENOUS | Status: DC | PRN
  Administered 2022-11-10: 60 mg via INTRAVENOUS
  Administered 2022-11-10 (×3): 20 mg via INTRAVENOUS

## 2022-11-10 MED ORDER — ROCURONIUM BROMIDE 10 MG/ML (PF) SYRINGE
PREFILLED_SYRINGE | INTRAVENOUS | Status: AC
  Filled 2022-11-10: qty 10

## 2022-11-10 MED ORDER — HYDROMORPHONE HCL 1 MG/ML IJ SOLN
0.2500 mg | INTRAMUSCULAR | Status: DC | PRN
  Administered 2022-11-10: 0.5 mg via INTRAVENOUS

## 2022-11-10 MED ORDER — METOPROLOL TARTRATE 25 MG PO TABS
25.0000 mg | ORAL_TABLET | Freq: Two times a day (BID) | ORAL | Status: DC
  Administered 2022-11-10 – 2022-11-11 (×2): 25 mg via ORAL
  Filled 2022-11-10 (×2): qty 1

## 2022-11-10 MED ORDER — ICOSAPENT ETHYL 1 G PO CAPS
2.0000 g | ORAL_CAPSULE | Freq: Two times a day (BID) | ORAL | Status: DC
  Filled 2022-11-10: qty 2

## 2022-11-10 MED ORDER — MORPHINE SULFATE (PF) 2 MG/ML IV SOLN: 2.0000 mg | INTRAVENOUS | Status: DC | PRN

## 2022-11-10 MED ORDER — INSULIN ASPART 100 UNIT/ML IJ SOLN
0.0000 [IU] | Freq: Three times a day (TID) | INTRAMUSCULAR | Status: DC
  Administered 2022-11-11: 3 [IU] via SUBCUTANEOUS

## 2022-11-10 MED ORDER — FENTANYL CITRATE (PF) 250 MCG/5ML IJ SOLN
INTRAMUSCULAR | Status: DC | PRN
  Administered 2022-11-10: 25 ug via INTRAVENOUS
  Administered 2022-11-10 (×2): 50 ug via INTRAVENOUS

## 2022-11-10 MED ORDER — INSULIN ASPART 100 UNIT/ML IJ SOLN
0.0000 [IU] | Freq: Every day | INTRAMUSCULAR | Status: DC
  Administered 2022-11-10: 2 [IU] via SUBCUTANEOUS

## 2022-11-10 MED ORDER — SUGAMMADEX SODIUM 200 MG/2ML IV SOLN
INTRAVENOUS | Status: DC | PRN
  Administered 2022-11-10: 200 mg via INTRAVENOUS

## 2022-11-10 MED ORDER — THROMBIN 5000 UNITS EX SOLR
CUTANEOUS | Status: AC
  Filled 2022-11-10: qty 5000

## 2022-11-10 MED ORDER — PHENOL 1.4 % MT LIQD: 1.0000 | OROMUCOSAL | Status: DC | PRN

## 2022-11-10 MED ORDER — DAPAGLIFLOZIN PROPANEDIOL 5 MG PO TABS
5.0000 mg | ORAL_TABLET | Freq: Every day | ORAL | Status: DC
  Administered 2022-11-11: 5 mg via ORAL
  Filled 2022-11-10: qty 1

## 2022-11-10 MED ORDER — LIDOCAINE 2% (20 MG/ML) 5 ML SYRINGE
INTRAMUSCULAR | Status: AC
  Filled 2022-11-10: qty 5

## 2022-11-10 MED ORDER — CLONAZEPAM 0.5 MG PO TABS
0.5000 mg | ORAL_TABLET | Freq: Every day | ORAL | Status: DC
  Administered 2022-11-10: 0.5 mg via ORAL
  Filled 2022-11-10: qty 1

## 2022-11-10 MED ORDER — CEFAZOLIN SODIUM-DEXTROSE 2-4 GM/100ML-% IV SOLN
2.0000 g | Freq: Three times a day (TID) | INTRAVENOUS | Status: AC
  Administered 2022-11-10 – 2022-11-11 (×2): 2 g via INTRAVENOUS
  Filled 2022-11-10 (×2): qty 100

## 2022-11-10 MED ORDER — DEXAMETHASONE SODIUM PHOSPHATE 10 MG/ML IJ SOLN
INTRAMUSCULAR | Status: DC | PRN
  Administered 2022-11-10: 8 mg via INTRAVENOUS

## 2022-11-10 MED ORDER — VANCOMYCIN HCL 1000 MG IV SOLR
INTRAVENOUS | Status: AC
  Filled 2022-11-10: qty 20

## 2022-11-10 MED ORDER — ONDANSETRON HCL 4 MG/2ML IJ SOLN: 4.0000 mg | Freq: Once | INTRAMUSCULAR | Status: DC | PRN

## 2022-11-10 MED ORDER — OXYCODONE HCL 5 MG PO TABS
10.0000 mg | ORAL_TABLET | ORAL | Status: DC | PRN
  Administered 2022-11-10 – 2022-11-11 (×3): 10 mg via ORAL
  Filled 2022-11-10 (×3): qty 2

## 2022-11-10 MED ORDER — CHLORHEXIDINE GLUCONATE 0.12 % MT SOLN
15.0000 mL | Freq: Once | OROMUCOSAL | Status: AC
  Administered 2022-11-10: 15 mL via OROMUCOSAL
  Filled 2022-11-10: qty 15

## 2022-11-10 MED ORDER — INSULIN ASPART 100 UNIT/ML IJ SOLN: 0.0000 [IU] | Freq: Three times a day (TID) | INTRAMUSCULAR | Status: DC

## 2022-11-10 MED ORDER — ACETAMINOPHEN 10 MG/ML IV SOLN
1000.0000 mg | Freq: Once | INTRAVENOUS | Status: DC | PRN
  Administered 2022-11-10: 1000 mg via INTRAVENOUS

## 2022-11-10 MED ORDER — ORAL CARE MOUTH RINSE: 15.0000 mL | Freq: Once | OROMUCOSAL | Status: AC

## 2022-11-10 MED ORDER — EZETIMIBE 10 MG PO TABS
10.0000 mg | ORAL_TABLET | Freq: Every day | ORAL | Status: DC
  Administered 2022-11-11: 10 mg via ORAL
  Filled 2022-11-10: qty 1

## 2022-11-10 MED ORDER — SODIUM CHLORIDE 0.9% FLUSH
3.0000 mL | Freq: Two times a day (BID) | INTRAVENOUS | Status: DC
  Administered 2022-11-10: 3 mL via INTRAVENOUS

## 2022-11-10 MED ORDER — ONDANSETRON HCL 4 MG/2ML IJ SOLN
INTRAMUSCULAR | Status: DC | PRN
  Administered 2022-11-10: 4 mg via INTRAVENOUS

## 2022-11-10 MED ORDER — INSULIN ASPART 100 UNIT/ML IJ SOLN: 0.0000 [IU] | INTRAMUSCULAR | Status: DC | PRN

## 2022-11-10 MED ORDER — PHENYLEPHRINE 80 MCG/ML (10ML) SYRINGE FOR IV PUSH (FOR BLOOD PRESSURE SUPPORT)
PREFILLED_SYRINGE | INTRAVENOUS | Status: DC | PRN
  Administered 2022-11-10: 80 ug via INTRAVENOUS
  Administered 2022-11-10: 160 ug via INTRAVENOUS
  Administered 2022-11-10: 240 ug via INTRAVENOUS

## 2022-11-10 MED ORDER — MENTHOL 3 MG MT LOZG: 1.0000 | LOZENGE | OROMUCOSAL | Status: DC | PRN

## 2022-11-10 MED ORDER — OXYCODONE HCL 5 MG/5ML PO SOLN: 5.0000 mg | Freq: Once | ORAL | Status: DC | PRN

## 2022-11-10 MED ORDER — EPHEDRINE 5 MG/ML INJ
INTRAVENOUS | Status: AC
  Filled 2022-11-10: qty 5

## 2022-11-10 MED ORDER — EPHEDRINE SULFATE-NACL 50-0.9 MG/10ML-% IV SOSY
PREFILLED_SYRINGE | INTRAVENOUS | Status: DC | PRN
  Administered 2022-11-10: 15 mg via INTRAVENOUS

## 2022-11-10 MED ORDER — THROMBIN 20000 UNITS EX SOLR
CUTANEOUS | Status: AC
  Filled 2022-11-10: qty 20000

## 2022-11-10 MED ORDER — THROMBIN 20000 UNITS EX SOLR: CUTANEOUS | Status: DC | PRN

## 2022-11-10 MED ORDER — BUPIVACAINE HCL (PF) 0.25 % IJ SOLN
INTRAMUSCULAR | Status: AC
  Filled 2022-11-10: qty 30

## 2022-11-10 MED ORDER — HYDROMORPHONE HCL 1 MG/ML IJ SOLN
INTRAMUSCULAR | Status: AC
  Filled 2022-11-10: qty 1

## 2022-11-10 MED ORDER — ONDANSETRON HCL 4 MG/2ML IJ SOLN: 4.0000 mg | Freq: Four times a day (QID) | INTRAMUSCULAR | Status: DC | PRN

## 2022-11-10 MED ORDER — FENTANYL CITRATE (PF) 250 MCG/5ML IJ SOLN
INTRAMUSCULAR | Status: AC
  Filled 2022-11-10: qty 5

## 2022-11-10 MED ORDER — CHLORHEXIDINE GLUCONATE CLOTH 2 % EX PADS: 6.0000 | MEDICATED_PAD | Freq: Once | CUTANEOUS | Status: DC

## 2022-11-10 MED ORDER — PANTOPRAZOLE SODIUM 40 MG PO TBEC
40.0000 mg | DELAYED_RELEASE_TABLET | Freq: Every day | ORAL | Status: DC
  Administered 2022-11-11: 40 mg via ORAL
  Filled 2022-11-10: qty 1

## 2022-11-10 MED ORDER — SENNA 8.6 MG PO TABS
1.0000 | ORAL_TABLET | Freq: Two times a day (BID) | ORAL | Status: DC
  Administered 2022-11-10 – 2022-11-11 (×2): 8.6 mg via ORAL
  Filled 2022-11-10 (×2): qty 1

## 2022-11-10 MED ORDER — POTASSIUM CHLORIDE IN NACL 20-0.9 MEQ/L-% IV SOLN: INTRAVENOUS | Status: DC

## 2022-11-10 MED ORDER — METHOCARBAMOL 500 MG PO TABS
500.0000 mg | ORAL_TABLET | Freq: Four times a day (QID) | ORAL | Status: DC | PRN
  Administered 2022-11-10 – 2022-11-11 (×3): 500 mg via ORAL
  Filled 2022-11-10 (×3): qty 1

## 2022-11-10 MED ORDER — ACETAMINOPHEN 10 MG/ML IV SOLN
INTRAVENOUS | Status: AC
  Filled 2022-11-10: qty 100

## 2022-11-10 MED ORDER — IRBESARTAN 75 MG PO TABS
37.5000 mg | ORAL_TABLET | Freq: Every day | ORAL | Status: DC
  Administered 2022-11-11: 37.5 mg via ORAL
  Filled 2022-11-10: qty 0.5

## 2022-11-10 MED ORDER — TAMSULOSIN HCL 0.4 MG PO CAPS: 0.4000 mg | ORAL_CAPSULE | ORAL | Status: DC

## 2022-11-10 MED ORDER — ONDANSETRON HCL 4 MG/2ML IJ SOLN
INTRAMUSCULAR | Status: AC
  Filled 2022-11-10: qty 2

## 2022-11-10 MED ORDER — BUPIVACAINE HCL (PF) 0.25 % IJ SOLN
INTRAMUSCULAR | Status: DC | PRN
  Administered 2022-11-10: 2 mL

## 2022-11-10 MED ORDER — SODIUM CHLORIDE 0.9 % IV SOLN
250.0000 mL | INTRAVENOUS | Status: DC
  Administered 2022-11-10: 250 mL via INTRAVENOUS

## 2022-11-10 MED ORDER — PHENYLEPHRINE HCL-NACL 20-0.9 MG/250ML-% IV SOLN
INTRAVENOUS | Status: DC | PRN
  Administered 2022-11-10: 25 ug/min via INTRAVENOUS

## 2022-11-10 MED ORDER — GABAPENTIN 300 MG PO CAPS
300.0000 mg | ORAL_CAPSULE | ORAL | Status: AC
  Administered 2022-11-10: 300 mg via ORAL
  Filled 2022-11-10: qty 1

## 2022-11-10 MED ORDER — THROMBIN 5000 UNITS EX SOLR: OROMUCOSAL | Status: DC | PRN

## 2022-11-10 MED ORDER — PROPOFOL 10 MG/ML IV BOLUS
INTRAVENOUS | Status: DC | PRN
  Administered 2022-11-10: 20 mg via INTRAVENOUS
  Administered 2022-11-10: 100 mg via INTRAVENOUS

## 2022-11-10 MED ORDER — VANCOMYCIN HCL 1000 MG IV SOLR
INTRAVENOUS | Status: DC | PRN
  Administered 2022-11-10: 1000 mg

## 2022-11-10 SURGICAL SUPPLY — 57 items
BAG COUNTER SPONGE SURGICOUNT (BAG) ×1 IMPLANT
BASKET BONE COLLECTION (BASKET) IMPLANT
BENZOIN TINCTURE PRP APPL 2/3 (GAUZE/BANDAGES/DRESSINGS) ×1 IMPLANT
BLADE CLIPPER SURG (BLADE) IMPLANT
BONE FIBERS PLIAFX 10 (Bone Implant) ×1 IMPLANT
BUR CARBIDE MATCH 3.0 (BURR) ×1 IMPLANT
CANISTER SUCT 3000ML PPV (MISCELLANEOUS) ×1 IMPLANT
CNTNR URN SCR LID CUP LEK RST (MISCELLANEOUS) ×1 IMPLANT
CONT SPEC 4OZ STRL OR WHT (MISCELLANEOUS) ×1
COVER BACK TABLE 60X90IN (DRAPES) ×1 IMPLANT
DRAPE C-ARM 42X72 X-RAY (DRAPES) IMPLANT
DRAPE LAPAROTOMY 100X72X124 (DRAPES) ×1 IMPLANT
DRAPE SURG 17X23 STRL (DRAPES) ×1 IMPLANT
DRSG OPSITE POSTOP 4X6 (GAUZE/BANDAGES/DRESSINGS) IMPLANT
DURAPREP 26ML APPLICATOR (WOUND CARE) ×1 IMPLANT
ELECT REM PT RETURN 9FT ADLT (ELECTROSURGICAL) ×1
ELECTRODE REM PT RTRN 9FT ADLT (ELECTROSURGICAL) ×1 IMPLANT
EVACUATOR 1/8 PVC DRAIN (DRAIN) IMPLANT
GAUZE 4X4 16PLY ~~LOC~~+RFID DBL (SPONGE) IMPLANT
GLOVE BIO SURGEON STRL SZ7 (GLOVE) IMPLANT
GLOVE BIO SURGEON STRL SZ8 (GLOVE) ×2 IMPLANT
GLOVE BIOGEL PI IND STRL 7.0 (GLOVE) IMPLANT
GLOVE BIOGEL PI IND STRL 7.5 (GLOVE) IMPLANT
GLOVE ECLIPSE 7.5 STRL STRAW (GLOVE) IMPLANT
GOWN STRL REUS W/ TWL LRG LVL3 (GOWN DISPOSABLE) IMPLANT
GOWN STRL REUS W/ TWL XL LVL3 (GOWN DISPOSABLE) ×2 IMPLANT
GOWN STRL REUS W/TWL 2XL LVL3 (GOWN DISPOSABLE) IMPLANT
GOWN STRL REUS W/TWL LRG LVL3 (GOWN DISPOSABLE) ×1
GOWN STRL REUS W/TWL XL LVL3 (GOWN DISPOSABLE) ×2
GRAFT BNE FBR PLIAFX PRIME 10 (Bone Implant) IMPLANT
GRAFT BONE PROTEIOS LRG 5CC (Orthopedic Implant) ×2 IMPLANT
HEMOSTAT POWDER KIT SURGIFOAM (HEMOSTASIS) IMPLANT
KIT BASIN OR (CUSTOM PROCEDURE TRAY) ×1 IMPLANT
KIT TURNOVER KIT B (KITS) ×1 IMPLANT
NDL HYPO 25X1 1.5 SAFETY (NEEDLE) ×1 IMPLANT
NEEDLE HYPO 25X1 1.5 SAFETY (NEEDLE) ×1 IMPLANT
NS IRRIG 1000ML POUR BTL (IV SOLUTION) ×1 IMPLANT
PACK LAMINECTOMY NEURO (CUSTOM PROCEDURE TRAY) ×1 IMPLANT
PAD ARMBOARD 7.5X6 YLW CONV (MISCELLANEOUS) ×3 IMPLANT
ROD LORD LIPPED TI 5.5X45 (Rod) IMPLANT
SCREW CANC SHANK MOD 5.5X35 (Screw) IMPLANT
SCREW ILIAC PA 5.5X40 (Screw) IMPLANT
SCREW PA INVICTUS 5.5X35 (Screw) IMPLANT
SCREW POLYAXIAL TULIP (Screw) IMPLANT
SET SCREW (Screw) ×4 IMPLANT
SET SCREW SPNE (Screw) IMPLANT
SPONGE SURGIFOAM ABS GEL 100 (HEMOSTASIS) ×1 IMPLANT
SPONGE T-LAP 4X18 ~~LOC~~+RFID (SPONGE) IMPLANT
STRIP CLOSURE SKIN 1/2X4 (GAUZE/BANDAGES/DRESSINGS) ×2 IMPLANT
SUT VIC AB 0 CT1 18XCR BRD8 (SUTURE) ×1 IMPLANT
SUT VIC AB 0 CT1 8-18 (SUTURE) ×1
SUT VIC AB 2-0 CP2 18 (SUTURE) ×1 IMPLANT
SUT VIC AB 3-0 SH 8-18 (SUTURE) ×2 IMPLANT
TOWEL GREEN STERILE (TOWEL DISPOSABLE) ×1 IMPLANT
TOWEL GREEN STERILE FF (TOWEL DISPOSABLE) ×1 IMPLANT
TRAY FOLEY MTR SLVR 16FR STAT (SET/KITS/TRAYS/PACK) IMPLANT
WATER STERILE IRR 1000ML POUR (IV SOLUTION) ×1 IMPLANT

## 2022-11-10 NOTE — Transfer of Care (Signed)
Immediate Anesthesia Transfer of Care Note  Patient: Garrett FANDINO Sr.  Procedure(s) Performed: Lumbar Two-Lumbar Three laminectomy  - bilateral with posterior lateral fusion and interspinous plating (Bilateral: Spine Lumbar)  Patient Location: PACU  Anesthesia Type:General  Level of Consciousness: awake and oriented  Airway & Oxygen Therapy: Patient Spontanous Breathing  Post-op Assessment: Report given to RN  Post vital signs: Reviewed and stable  Last Vitals:  Vitals Value Taken Time  BP 147/90   Temp    Pulse 79   Resp 13   SpO2 94     Last Pain:  Vitals:   11/10/22 0946  TempSrc:   PainSc: 4       Patients Stated Pain Goal: 0 (91/79/15 0569)  Complications: No notable events documented.

## 2022-11-10 NOTE — Anesthesia Procedure Notes (Signed)
Arterial Line Insertion Start/End12/29/2023 11:25 AM Performed by: Janace Litten, CRNA, CRNA  Patient location: Pre-op. Preanesthetic checklist: patient identified, surgical consent, monitors and equipment checked and pre-op evaluation Lidocaine 1% used for infiltration Left, radial was placed Catheter size: 20 G Hand hygiene performed  and maximum sterile barriers used   Attempts: 1 Procedure performed without using ultrasound guided technique. Following insertion, dressing applied and Biopatch. Post procedure assessment: normal  Patient tolerated the procedure well with no immediate complications.

## 2022-11-10 NOTE — H&P (Signed)
Subjective: Patient is a 81 y.o. male admitted for back and leg pain. Onset of symptoms was several months ago, gradually worsening since that time.  The pain is rated severe, and is located at the across the lower back and radiates to legs. The pain is described as aching and occurs all day. The symptoms have been progressive. Symptoms are exacerbated by exercise, standing, and walking for more than a few minutes. MRI or CT showed stenosis and DDD L2-3   Past Medical History:  Diagnosis Date   AAA (abdominal aortic aneurysm) (HCC)    Arthritis    knees   Atrial fibrillation (HCC)    CAD (coronary artery disease)    Cancer ([place])    prostate   CKD (chronic kidney disease), stage III ([place]) 06/18/2022   COVID    06/2019 and in 2022 states they were mild cases   Diabetes mellitus without complication (HCC)    Dyspnea    once in a while   Dysrhythmia    A-fib pt is on Eliquis   GERD (gastroesophageal reflux disease)    Gout    History of kidney stones    Hyperlipidemia    Hypertension    Hypothyroidism    Myocardial infarction ([place]) 01/1991   sp inferior   Nerve compression    right leg   Reflux    Seasonal allergies    Thoracic ascending aortic aneurysm ([place])    Thyroid disease    hypothyroidism    Past Surgical History:  Procedure Laterality Date   ABDOMINAL AORTAGRAM N/A 02/28/2012   Procedure: ABDOMINAL Maxcine Ham;  Surgeon: Serafina Mitchell, MD;  Location: [facility] CATH LAB;  Service: Cardiovascular;  Laterality: N/A;   abdominal aortagram embolization  02/28/2012   ABDOMINAL AORTIC ANEURYSM REPAIR  07/14/2012   CARDIAC CATHETERIZATION     CATARACT EXTRACTION Bilateral 2016   CORONARY ARTERY BYPASS GRAFT  04/13/2009   CORONARY BALLOON ANGIOPLASTY N/A 06/15/2022   Procedure: CORONARY BALLOON ANGIOPLASTY;  Surgeon: Sherren Mocha, MD;  Location: [place] CV LAB;  Service: Cardiovascular;  Laterality: N/A;   CORONARY STENT PLACEMENT  1992 and  2009   RCA   EMBOLIZATION  Right 02/28/2012   Procedure: EMBOLIZATION;  Surgeon: Serafina Mitchell, MD;  Location: [facility] CATH LAB;  Service: Cardiovascular;  Laterality: Right;   IR GENERIC HISTORICAL  12/05/2016   IR RADIOLOGIST EVAL & MGMT 12/05/2016 Jacqulynn Cadet, MD [facility] INTERV RAD   IR RADIOLOGIST EVAL & MGMT  07/10/2019   IR RADIOLOGIST EVAL & MGMT  08/25/2020   IR RADIOLOGIST EVAL & MGMT  08/16/2021   IR RADIOLOGIST EVAL & MGMT  08/31/2022   KIDNEY STONE SURGERY     LEFT HEART CATH AND CORONARY ANGIOGRAPHY N/A 06/15/2022   Procedure: LEFT HEART CATH AND CORONARY ANGIOGRAPHY;  Surgeon: Sherren Mocha, MD;  Location: [place] CV LAB;  Service: Cardiovascular;  Laterality: N/A;   LEFT HEART CATHETERIZATION WITH CORONARY/GRAFT ANGIOGRAM N/A 01/08/2015   Procedure: LEFT HEART CATHETERIZATION WITH Beatrix Fetters;  Surgeon: Jacolyn Reedy, MD;  Location: [facility] CATH LAB;  Service: Cardiovascular;  Laterality: N/A;   parathyroid adenoma  1981   surgery   PROSTATE SURGERY     rad seeds   SPINE SURGERY  2006   [place]    Prior to Admission medications   Medication Sig Start Date End Date Taking? Authorizing Provider  acetaminophen (TYLENOL) 650 MG CR tablet Take 650 mg by mouth every 8 (eight) hours as needed for pain.  Yes [provider]  allopurinol (ZYLOPRIM) 300 MG tablet Take 300 mg by mouth every other day. on even days of the month   Yes [provider]  atorvastatin (LIPITOR) 40 MG tablet Take 1 tablet (40 mg total) by mouth daily. 06/18/22  Yes Barrett, Evelene Croon, PA-C  Cholecalciferol (VITAMIN D3) 2000 UNITS TABS Take 2,000 Units by mouth daily.    Yes [provider]  clonazePAM (KLONOPIN) 1 MG tablet Take 0.5 mg by mouth at bedtime.   Yes [provider]  clopidogrel (PLAVIX) 75 MG tablet TAKE 1 TABLET BY MOUTH  DAILY 09/12/22  Yes Jerline Pain, MD  Coenzyme Q10 300 MG CAPS Take 300 mg by mouth daily.   Yes [provider]   Cyanocobalamin (VITAMIN B-12 PO) Take 2,500 mcg by mouth daily.   Yes [provider]  dapagliflozin propanediol (FARXIGA) 5 MG TABS tablet Take 1 tablet (5 mg total) by mouth daily before breakfast. 10/10/22  Yes Shamleffer, Melanie Crazier, MD  ELIQUIS 5 MG TABS tablet TAKE 1 TABLET BY MOUTH TWICE A DAY 08/02/22  Yes Jerline Pain, MD  ezetimibe (ZETIA) 10 MG tablet TAKE 1 TABLET BY MOUTH  DAILY 06/12/22  Yes Early Osmond, MD  fenofibrate micronized (LOFIBRA) 200 MG capsule Take 1 capsule (200 mg total) by mouth daily before breakfast. 03/30/22  Yes Shamleffer, Melanie Crazier, MD  fluticasone (FLONASE) 50 MCG/ACT nasal spray Place 2 sprays into both nostrils daily as needed for allergies.   Yes [provider]  glipiZIDE (GLUCOTROL) 5 MG tablet Take 0.5 tablets (2.5 mg total) by mouth daily before breakfast AND 1 tablet (5 mg total) daily before supper. 09/21/22  Yes Shamleffer, Melanie Crazier, MD  levothyroxine (SYNTHROID, LEVOTHROID) 175 MCG tablet Take 175 mcg by mouth daily before breakfast.   Yes [provider]  lidocaine (ASPERCREME LIDOCAINE) 4 % Place 1 patch onto the skin daily as needed (pain).   Yes [provider]  loratadine (CLARITIN) 10 MG tablet Take 10 mg by mouth at bedtime.   Yes [provider]  magnesium oxide (MAG-OX) 400 (240 Mg) MG tablet Take 400 mg by mouth daily.   Yes [provider]  metoprolol tartrate (LOPRESSOR) 25 MG tablet TAKE 1 TABLET BY MOUTH  TWICE DAILY 03/07/22  Yes Jerline Pain, MD  Multiple Vitamins-Minerals (PRESERVISION AREDS 2) CAPS Take 1 capsule by mouth 2 (two) times daily.   Yes [provider]  nitroGLYCERIN (NITROSTAT) 0.4 MG SL tablet Place 1 tablet (0.4 mg total) under the tongue every 5 (five) minutes as needed for chest pain. 06/17/22  Yes Barrett, Evelene Croon, PA-C  olmesartan (BENICAR) 20 MG tablet Take 20 mg by mouth daily. 08/23/22  Yes [provider]  pantoprazole  (PROTONIX) 40 MG tablet TAKE 1 TABLET BY MOUTH DAILY 08/07/22  Yes Jerline Pain, MD  Tamsulosin HCl (FLOMAX) 0.4 MG CAPS Take 0.4 mg by mouth every other day.   Yes [provider]  traMADol (ULTRAM) 50 MG tablet Take 50 mg by mouth every 12 (twelve) hours as needed for moderate pain. 04/05/22  Yes [provider]  triamcinolone cream (KENALOG) 0.1 % Apply 1 application  topically daily as needed (rash). 02/07/18  Yes [provider]  TURMERIC PO Take 2 tablets by mouth in the morning and at bedtime.   Yes [provider]  VASCEPA 1 g capsule TAKE 2 CAPSULES BY MOUTH 2 TIMES DAILY. 04/13/22  Yes Jerline Pain,  MD  ACCU-CHEK GUIDE test strip USE TWICE DAILY 05/29/22   Shamleffer, Melanie Crazier, MD  fluorouracil (EFUDEX) 5 % cream Apply 1 Application topically daily as needed (cancer spots). Use as directed    [provider]  Polyethyl Glycol-Propyl Glycol (SYSTANE OP) Place 1 drop into both eyes daily as needed (dry/irritated eyes).    [provider]   Allergies  Allergen Reactions   Ace Inhibitors Cough   Crestor [Rosuvastatin] Other (See Comments)    Muscle aches   Nitroglycerin Other (See Comments)    Very pronounced lowered BP with IV nitro for caths   Quinolones Other (See Comments)    Aortic root enlargement    Social History   Tobacco Use   Smoking status: Former    Types: Cigarettes    Quit date: 08/01/1970    Years since quitting: 52.3   Smokeless tobacco: Never  Substance Use Topics   Alcohol use: Yes    Alcohol/week: 6.0 standard drinks of alcohol    Types: 3 Glasses of wine, 3 Cans of beer per week    Comment: 1-3 glasses 3-4 times a week    Family History  Problem Relation Age of Onset   Heart disease Father        Heart Disease before age 72   Heart attack Father    Hyperlipidemia Father    Hypertension Father    Stroke Mother    Deep vein thrombosis Mother    Heart disease Sister    Diabetes Sister     Hyperlipidemia Sister    Hypertension Sister    Heart attack Sister    Heart disease Paternal Uncle    Diabetes Maternal Grandmother    Diabetes Sister      Review of Systems  Positive ROS: neg  All other systems have been reviewed and were otherwise negative with the exception of those mentioned in the HPI and as above.  Objective: Vital signs in last 24 hours: Temp:  [97.7 F (36.5 C)] 97.7 F (36.5 C) (12/29 0943) Pulse Rate:  [69] 69 (12/29 0943) Resp:  [17] 17 (12/29 0943) BP: (157)/(91) 157/91 (12/29 0943) SpO2:  [96 %] 96 % (12/29 0943) Weight:  [84.8 kg] 84.8 kg (12/29 0943)  General Appearance: Alert, cooperative, no distress, appears stated age Head: Normocephalic, without obvious abnormality, atraumatic Eyes: PERRL, conjunctiva/corneas clear, EOM's intact    Neck: Supple, symmetrical, trachea midline Back: Symmetric, no curvature, ROM normal, no CVA tenderness Lungs:  respirations unlabored Heart: Regular rate and rhythm Abdomen: Soft, non-tender Extremities: Extremities normal, atraumatic, no cyanosis or edema Pulses: 2+ and symmetric all extremities Skin: Skin color, texture, turgor normal, no rashes or lesions  NEUROLOGIC:   Mental status: Alert and oriented x4,  no aphasia, good attention span, fund of knowledge, and memory Motor Exam - grossly normal Sensory Exam - grossly normal Reflexes: 1+ Coordination - grossly normal Gait - grossly normal Balance - grossly normal Cranial Nerves: I: smell Not tested  II: visual acuity  OS: nl    OD: nl  II: visual fields Full to confrontation  II: pupils Equal, round, reactive to light  III,VII: ptosis None  III,IV,VI: extraocular muscles  Full ROM  V: mastication Normal  V: facial light touch sensation  Normal  V,VII: corneal reflex  Present  VII: facial muscle function - upper  Normal  VII: facial muscle function - lower Normal  VIII: hearing Not tested  IX: soft palate elevation  Normal  IX,X: gag  reflex Present  XI: trapezius strength  5/5  XI: sternocleidomastoid strength 5/5  XI: neck flexion strength  5/5  XII: tongue strength  Normal    Data Review Lab Results  Component Value Date   WBC 5.3 10/31/2022   HGB 14.8 10/31/2022   HCT 44.4 10/31/2022   MCV 89.7 10/31/2022   PLT 144 (L) 10/31/2022   Lab Results  Component Value Date   NA 137 10/31/2022   K 4.1 10/31/2022   CL 104 10/31/2022   CO2 22 10/31/2022   BUN 16 10/31/2022   CREATININE 1.65 (H) 10/31/2022   GLUCOSE 148 (H) 10/31/2022   Lab Results  Component Value Date   INR 1.3 (H) 11/10/2022    Assessment/Plan:  Estimated body mass index is 27.62 kg/m as calculated from the following:   Height as of this encounter: '5\' 9"'$  (1.753 m).   Weight as of this encounter: 84.8 kg. Patient admitted for L2-3 decompression and fusion. Patient has failed a reasonable attempt at conservative therapy.  I explained the condition and procedure to the patient and answered any questions.  Patient wishes to proceed with procedure as planned. Understands risks/ benefits and typical outcomes of procedure.   Eustace Moore 11/10/2022 11:17 AM

## 2022-11-10 NOTE — Anesthesia Postprocedure Evaluation (Signed)
Anesthesia Post Note  Patient: Garrett BEZA Sr.  Procedure(s) Performed: Lumbar Two-Lumbar Three laminectomy  - bilateral with posterior lateral fusion and interspinous plating (Bilateral: Spine Lumbar)     Patient location during evaluation: PACU Anesthesia Type: General Level of consciousness: awake and alert Pain management: pain level controlled Vital Signs Assessment: post-procedure vital signs reviewed and stable Respiratory status: spontaneous breathing, nonlabored ventilation, respiratory function stable and patient connected to nasal cannula oxygen Cardiovascular status: blood pressure returned to baseline and stable Postop Assessment: no apparent nausea or vomiting Anesthetic complications: no  No notable events documented.  Last Vitals:  Vitals:   11/10/22 1515 11/10/22 1530  BP: (!) 143/79 (!) 140/90  Pulse: 72 72  Resp: 11 16  Temp:    SpO2: 96% 94%    Last Pain:  Vitals:   11/10/22 1430  TempSrc:   PainSc: 8                  Barnet Glasgow

## 2022-11-10 NOTE — Op Note (Signed)
11/10/2022  2:09 PM  PATIENT:  Garrett Meek Sr.  81 y.o. 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 Decompressive lumbar laminectomy, hemi facetectomy and foraminotomies L2-3 bilaterally 3. Posterior fixation L2-3 using ATEC cortical pedicle screws.  4. Intertransverse arthrodesis L2-3 using morcellized autograft and allograft.  SURGEON:  Sherley Bounds, MD  ASSISTANTS: Margo Aye FNP  ANESTHESIA:  General  EBL: 150 ml  Total I/O In: 800 [I.V.:800] Out: 330 [Urine:180; Blood:150]  BLOOD ADMINISTERED:none  DRAINS: none   INDICATION FOR PROCEDURE: This patient presented with back pain with claudication. Imaging revealed very spinal stenosis L2-3 with spondylolisthesis and flatback syndrome. The patient tried a reasonable attempt at conservative medical measures without relief. I recommended decompression and instrumented fusion to address the stenosis as well as the segmental  instability.  Patient understood the risks, benefits, and alternatives and potential outcomes and wished to proceed.  PROCEDURE DETAILS:  The patient was brought to the operating room. After induction of generalized endotracheal anesthesia the patient was rolled into the prone position on chest rolls and all pressure points were padded. The patient's lumbar region was cleaned and then prepped with DuraPrep and draped in the usual sterile fashion. Anesthesia was injected and then a dorsal midline incision was made and carried down to the lumbosacral fascia. The fascia was opened and the paraspinous musculature was taken down in a subperiosteal fashion to expose L2-3. A self-retaining retractor was placed. Intraoperative fluoroscopy confirmed my level, and I started with placement of the L2 cortical pedicle screws. The pedicle screw entry zones were identified utilizing surface landmarks and   AP and lateral fluoroscopy. I scored the cortex with the high-speed drill and then used the hand drill to drill an upward and outward direction into the pedicle. I then tapped line to line. I then placed a 5.5 x 35 mm cortical pedicle screw into the pedicles of L2 bilaterally.    I then turned my attention to the decompression and complete lumbar laminectomies, hemi- facetectomies, and foraminotomies were performed at L2-3.  My nurse practitioner was directly involved in the decompression and exposure of the neural elements. the patient had significant spinal stenosis. Much more generous decompression and generous foraminotomy was undertaken in order to adequately decompress the neural elements and address the patient's leg pain. The yellow ligament was removed to expose the underlying dura and nerve roots, and generous foraminotomies were performed to adequately decompress the neural elements. Both the exiting and traversing nerve roots were decompressed on both sides until a coronary dilator passed easily along the nerve roots.   We then turned our attention to the placement of the lower pedicle screws. The pedicle screw entry zones were identified utilizing surface landmarks and fluoroscopy. I drilled into each pedicle utilizing the hand drill, and tapped each pedicle with the appropriate tap. We palpated with a ball probe to assure no break in the cortex. We then placed 5.5 x 45 mm pedicle screws into the pedicles bilaterally at L3.  My nurse practitioner assisted in placement of the pedicle screws.  We then decorticated the transverse processes and laid a mixture of morcellized autograft and allograft out over these to perform intertransverse arthrodesis at L2-3 bilaterally. We then placed lordotic rods into the multiaxial screw heads of the pedicle screws and locked these in position with the locking caps and anti-torque device. We then checked our construct with AP  and lateral fluoroscopy. Irrigated with  copious amounts of bacitracin-containing saline solution. Inspected the nerve roots once again to assure adequate decompression, lined to the dura with Gelfoam,  and then we closed the muscle and the fascia with 0 Vicryl. Closed the subcutaneous tissues with 2-0 Vicryl and subcuticular tissues with 3-0 Vicryl. The skin was closed with benzoin and Steri-Strips. Dressing was then applied, the patient was awakened from general anesthesia and transported to the recovery room in stable condition. At the end of the procedure all sponge, needle and instrument counts were correct.   PLAN OF CARE: admit to inpatient  PATIENT DISPOSITION:  PACU - hemodynamically stable.   Delay start of Pharmacological VTE agent (>24hrs) due to surgical blood loss or risk of bleeding:  yes

## 2022-11-10 NOTE — Anesthesia Procedure Notes (Signed)
Procedure Name: Intubation Date/Time: 11/10/2022 11:48 AM  Performed by: Barrington Ellison, CRNAPre-anesthesia Checklist: Patient identified, Emergency Drugs available, Suction available and Patient being monitored Patient Re-evaluated:Patient Re-evaluated prior to induction Oxygen Delivery Method: Circle System Utilized Preoxygenation: Pre-oxygenation with 100% oxygen Induction Type: IV induction Ventilation: Mask ventilation without difficulty, Two handed mask ventilation required and Oral airway inserted - appropriate to patient size Laryngoscope Size: Mac and 4 Grade View: Grade I Tube type: Oral Tube size: 7.5 mm Number of attempts: 1 Airway Equipment and Method: Stylet and Oral airway Placement Confirmation: ETT inserted through vocal cords under direct vision, positive ETCO2 and breath sounds checked- equal and bilateral Secured at: 22 cm Tube secured with: Tape Dental Injury: Teeth and Oropharynx as per pre-operative assessment

## 2022-11-10 NOTE — Progress Notes (Signed)
Dr. Ronnald Ramp made aware of patient's PT/INR result from today's lab draw. No new orders received.

## 2022-11-11 DIAGNOSIS — Z79899 Other long term (current) drug therapy: Secondary | ICD-10-CM | POA: Diagnosis not present

## 2022-11-11 DIAGNOSIS — M4316 Spondylolisthesis, lumbar region: Secondary | ICD-10-CM | POA: Diagnosis not present

## 2022-11-11 DIAGNOSIS — I2510 Atherosclerotic heart disease of native coronary artery without angina pectoris: Secondary | ICD-10-CM | POA: Diagnosis not present

## 2022-11-11 DIAGNOSIS — Z951 Presence of aortocoronary bypass graft: Secondary | ICD-10-CM | POA: Diagnosis not present

## 2022-11-11 DIAGNOSIS — Z7902 Long term (current) use of antithrombotics/antiplatelets: Secondary | ICD-10-CM | POA: Diagnosis not present

## 2022-11-11 DIAGNOSIS — I4891 Unspecified atrial fibrillation: Secondary | ICD-10-CM | POA: Diagnosis not present

## 2022-11-11 DIAGNOSIS — Z8616 Personal history of COVID-19: Secondary | ICD-10-CM | POA: Diagnosis not present

## 2022-11-11 DIAGNOSIS — M48062 Spinal stenosis, lumbar region with neurogenic claudication: Secondary | ICD-10-CM | POA: Diagnosis not present

## 2022-11-11 DIAGNOSIS — Z7984 Long term (current) use of oral hypoglycemic drugs: Secondary | ICD-10-CM | POA: Diagnosis not present

## 2022-11-11 DIAGNOSIS — Z87891 Personal history of nicotine dependence: Secondary | ICD-10-CM | POA: Diagnosis not present

## 2022-11-11 DIAGNOSIS — E1122 Type 2 diabetes mellitus with diabetic chronic kidney disease: Secondary | ICD-10-CM | POA: Diagnosis not present

## 2022-11-11 DIAGNOSIS — E039 Hypothyroidism, unspecified: Secondary | ICD-10-CM | POA: Diagnosis not present

## 2022-11-11 DIAGNOSIS — Z955 Presence of coronary angioplasty implant and graft: Secondary | ICD-10-CM | POA: Diagnosis not present

## 2022-11-11 DIAGNOSIS — Z7901 Long term (current) use of anticoagulants: Secondary | ICD-10-CM | POA: Diagnosis not present

## 2022-11-11 DIAGNOSIS — I129 Hypertensive chronic kidney disease with stage 1 through stage 4 chronic kidney disease, or unspecified chronic kidney disease: Secondary | ICD-10-CM | POA: Diagnosis not present

## 2022-11-11 DIAGNOSIS — N183 Chronic kidney disease, stage 3 unspecified: Secondary | ICD-10-CM | POA: Diagnosis not present

## 2022-11-11 LAB — GLUCOSE, CAPILLARY: Glucose-Capillary: 182 mg/dL — ABNORMAL HIGH (ref 70–99)

## 2022-11-11 MED ORDER — OXYCODONE HCL 10 MG PO TABS: 10.0000 mg | ORAL_TABLET | ORAL | 0 refills | Status: DC | PRN

## 2022-11-11 NOTE — Discharge Instructions (Signed)
Wound Care Keep incision covered and dry for three days.  Do not put any creams, lotions, or ointments on incision. Leave steri-strips on back.  They will fall off by themselves. Activity Walk each and every day, increasing distance each day. No lifting greater than 5 lbs.  Avoid excessive back bending. No driving for 2 weeks; may ride as a passenger locally. If provided with back brace, wear when out of bed.  It is not necessary to wear brace in bed. Diet Resume your normal diet.   Call Your Doctor If Any of These Occur Redness, drainage, or swelling at the wound.  Temperature greater than 101 degrees. Severe pain not relieved by pain medication. Incision starts to come apart. Follow Up Appt Call [phone]) for problems.  If you have any hardware placed in your spine, you will need an x-ray before your appointment.

## 2022-11-11 NOTE — Progress Notes (Signed)
Physical Therapy Note  Spoke with occupational therapy after their initial evaluation. OT reports patient is functioning at a high level of independence and no physical therapy is indicated at this time. PT is signing-off. Please re-order if there is any significant change in status. Thank you for this referral.  Tan Clopper, PT, DPT Physical Therapist Acute Rehabilitation Services [facility]    

## 2022-11-11 NOTE — Progress Notes (Signed)
Neurosurgery Service Progress Note  Subjective: No acute events overnight. Pt states that he is feeling great with improvement in pre-op pain. Back pain as expected for above procedure.    Objective: Vitals:   11/10/22 1936 11/11/22 0000 11/11/22 0436 11/11/22 0727  BP: (!) 143/71 138/74 125/87 124/74  Pulse: 72 76 67 69  Resp: '18 16 18 18  '$ Temp: 97.6 F (36.4 C) 97.8 F (36.6 C) 97.7 F (36.5 C) 98.2 F (36.8 C)  TempSrc: Oral Oral Oral Oral  SpO2: 97% 98% 96% 96%  Weight:      Height:        Physical Exam: Strength 5/5 x4 and SILTx4, c/d/I  Assessment & Plan: 81 y.o. male s/p PLIF 2-3, recovering well.  -Discharge home today  Craige Cotta, PA-C 11/11/22 9:13 AM

## 2022-11-11 NOTE — Discharge Summary (Signed)
Physician Discharge Summary  Patient ID: Garrett Jordan Sr. MRN: [id number] DOB/AGE: Mar 17, 1941 81 y.o.  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 The patient tolerated the procedure well and was taken to the recovery room and then to the floor in stable condition. The hospital course was routine. There were no complications. The wound remained clean dry and intact. Pt had appropriate back soreness. No complaints of leg pain or new N/T/W. The patient remained afebrile with stable vital signs, and tolerated a regular diet. The patient continued to increase activities, and pain was well controlled with oral pain medications.   Consults: none  Significant Diagnostic Studies:  Results for orders placed or performed during the hospital encounter of 11/10/22  Protime-INR  Result Value Ref Range   Prothrombin Time 16.2 (H) 11.4 - 15.2 seconds   INR 1.3 (H) 0.8 - 1.2  Glucose, capillary  Result Value Ref Range   Glucose-Capillary 147 (H) 70 - 99 mg/dL  Glucose, capillary  Result Value Ref Range   Glucose-Capillary 169 (H) 70 - 99 mg/dL  Glucose, capillary  Result Value Ref Range   Glucose-Capillary 239 (H) 70 - 99 mg/dL  Glucose, capillary  Result Value Ref Range   Glucose-Capillary 182 (H) 70 - 99 mg/dL    DG Lumbar Spine 2-3 Views  Result Date: 11/10/2022 CLINICAL DATA:  Elective surgery, L2-L3 instrumented fusion EXAM: LUMBAR SPINE - 2-3 VIEW COMPARISON:  None Available. FINDINGS: Intraoperative images during L2-L3 lumbar fusion. Aortoiliac stent noted. IMPRESSION: Intraoperative images during L2-L3 lumbar fusion. Electronically Signed   By: Maurine Simmering M.D.   On: 11/10/2022 14:45   DG C-Arm 1-60 Min-No Report  Result Date:  11/10/2022 Fluoroscopy was utilized by the requesting physician.  No radiographic interpretation.   DG C-Arm 1-60 Min-No Report  Result Date: 11/10/2022 Fluoroscopy was utilized by the requesting physician.  No radiographic interpretation.    Antibiotics:  Anti-infectives (From admission, onward)    Start     Dose/Rate Route Frequency Ordered Stop   11/10/22 2000  ceFAZolin (ANCEF) IVPB 2g/100 mL premix        2 g 200 mL/hr over 30 Minutes Intravenous Every 8 hours 11/10/22 1546 11/11/22 0502   11/10/22 1352  vancomycin (VANCOCIN) powder  Status:  Discontinued          As needed 11/10/22 1352 11/10/22 1410   11/10/22 0945  ceFAZolin (ANCEF) IVPB 2g/100 mL premix        2 g 200 mL/hr over 30 Minutes Intravenous On call to O.R. 11/10/22 0936 11/10/22 1150       Discharge Exam: Blood pressure 124/74, pulse 69, temperature 98.2 F (36.8 C), temperature source Oral, resp. rate 18, height '5\' 9"'$  (1.753 m), weight 84.8 kg, SpO2 96 %. Neurologic: Grossly normal Dressing dry  Discharge Medications:   Allergies as of 11/11/2022       Reactions   Ace Inhibitors Cough   Crestor [rosuvastatin] Other (See Comments)   Muscle aches   Nitroglycerin Other (See Comments)   Very pronounced lowered BP with IV nitro for caths   Quinolones Other (See Comments)   Aortic root enlargement        Medication List     TAKE these medications    Accu-Chek Guide test strip Generic drug:  glucose blood USE TWICE DAILY   acetaminophen 650 MG CR tablet Commonly known as: TYLENOL Take 650 mg by mouth every 8 (eight) hours as needed for pain.   allopurinol 300 MG tablet Commonly known as: ZYLOPRIM Take 300 mg by mouth every other day. on even days of the month   Aspercreme Lidocaine 4 % Generic drug: lidocaine Place 1 patch onto the skin daily as needed (pain).   atorvastatin 40 MG tablet Commonly known as: LIPITOR Take 1 tablet (40 mg total) by mouth daily.   clonazePAM 1 MG  tablet Commonly known as: KLONOPIN Take 0.5 mg by mouth at bedtime.   clopidogrel 75 MG tablet Commonly known as: PLAVIX TAKE 1 TABLET BY MOUTH  DAILY Notes to patient: START IN 3 DAYS   Coenzyme Q10 300 MG Caps Take 300 mg by mouth daily.   dapagliflozin propanediol 5 MG Tabs tablet Commonly known as: Farxiga Take 1 tablet (5 mg total) by mouth daily before breakfast.   Eliquis 5 MG Tabs tablet Generic drug: apixaban TAKE 1 TABLET BY MOUTH TWICE A DAY Notes to patient: START IN 5 DAYS   ezetimibe 10 MG tablet Commonly known as: ZETIA TAKE 1 TABLET BY MOUTH  DAILY   fenofibrate micronized 200 MG capsule Commonly known as: LOFIBRA Take 1 capsule (200 mg total) by mouth daily before breakfast.   fluorouracil 5 % cream Commonly known as: EFUDEX Apply 1 Application topically daily as needed (cancer spots). Use as directed   fluticasone 50 MCG/ACT nasal spray Commonly known as: FLONASE Place 2 sprays into both nostrils daily as needed for allergies.   glipiZIDE 5 MG tablet Commonly known as: GLUCOTROL Take 0.5 tablets (2.5 mg total) by mouth daily before breakfast AND 1 tablet (5 mg total) daily before supper.   levothyroxine 175 MCG tablet Commonly known as: SYNTHROID Take 175 mcg by mouth daily before breakfast.   loratadine 10 MG tablet Commonly known as: CLARITIN Take 10 mg by mouth at bedtime.   magnesium oxide 400 (240 Mg) MG tablet Commonly known as: MAG-OX Take 400 mg by mouth daily.   metoprolol tartrate 25 MG tablet Commonly known as: LOPRESSOR TAKE 1 TABLET BY MOUTH  TWICE DAILY   nitroGLYCERIN 0.4 MG SL tablet Commonly known as: Nitrostat Place 1 tablet (0.4 mg total) under the tongue every 5 (five) minutes as needed for chest pain.   olmesartan 20 MG tablet Commonly known as: BENICAR Take 20 mg by mouth daily.   Oxycodone HCl 10 MG Tabs Take 1 tablet (10 mg total) by mouth every 4 (four) hours as needed for severe pain ((score 7 to 10)).    pantoprazole 40 MG tablet Commonly known as: PROTONIX TAKE 1 TABLET BY MOUTH DAILY   PreserVision AREDS 2 Caps Take 1 capsule by mouth 2 (two) times daily.   SYSTANE OP Place 1 drop into both eyes daily as needed (dry/irritated eyes).   tamsulosin 0.4 MG Caps capsule Commonly known as: FLOMAX Take 0.4 mg by mouth every other day.   traMADol 50 MG tablet Commonly known as: ULTRAM Take 50 mg by mouth every 12 (twelve) hours as needed for moderate pain.   triamcinolone cream 0.1 % Commonly known as: KENALOG Apply 1 application  topically daily as needed (rash).   TURMERIC PO Take 2 tablets by mouth in the morning and at bedtime.   Vascepa 1 g capsule Generic drug: icosapent Ethyl TAKE 2 CAPSULES BY MOUTH 2 TIMES DAILY.   VITAMIN B-12 PO  Take 2,500 mcg by mouth daily.   Vitamin D3 50 MCG (2000 UT) Tabs Take 2,000 Units by mouth daily.               Durable Medical Equipment  (From admission, onward)           Start     Ordered   11/10/22 1625  DME Walker rolling  Once       Question:  Patient needs a walker to treat with the following condition  Answer:  S/P lumbar fusion   11/10/22 1624   11/10/22 1625  DME 3 n 1  Once        11/10/22 1624            Disposition: home   Final Dx: PLIF L2-3  Discharge Instructions      Remove dressing in 72 hours   Complete by: As directed    Call MD for:  difficulty breathing, headache or visual disturbances   Complete by: As directed    Call MD for:  persistant nausea and vomiting   Complete by: As directed    Call MD for:  redness, tenderness, or signs of infection (pain, swelling, redness, odor or green/yellow discharge around incision site)   Complete by: As directed    Call MD for:  severe uncontrolled pain   Complete by: As directed    Call MD for:  temperature >100.4   Complete by: As directed    Diet - low sodium heart healthy   Complete by: As directed    Discharge instructions   Complete by:  As directed    Start plavix Monday, start eliquis next wednesday   Increase activity slowly   Complete by: As directed         Follow-up Information     Eustace Moore, MD. Call.   Specialty: Neurosurgery Why: As needed, If symptoms worsen Contact information: [address] [place] [address] [place] [address] [phone]         Eustace Moore, MD. Schedule an appointment as soon as possible for a visit in 2 week(s).   Specialty: Neurosurgery Contact information: [address] [place] [address] [place] [address] [phone]                  Signed: Eustace Moore 11/11/2022, 8:52 AM

## 2022-11-11 NOTE — TOC Transition Note (Signed)
Transition of Care [facility]) - CM/SW Discharge Note   Patient Details  Name: Garrett FRITZE Sr. MRN: [id number] Date of Birth: 1941-09-04  Transition of Care [facility] CM/SW Contact:  Carles Collet, RN Phone Number: 11/11/2022, 8:57 AM   Clinical Narrative:      Patient with order to DC to home today. Unit staff to provide DME needed for home.   No HH needs identified   Patient will have family/ friends provide transportation home. No other TOC needs identified for DC       Patient Goals and CMS Choice      Discharge Placement                         Discharge Plan and Services Additional resources added to the After Visit Summary for                                       Social Determinants of Health (SDOH) Interventions SDOH Screenings   Depression (PHQ2-9): Low Risk  (09/22/2022)  Tobacco Use: Medium Risk (11/10/2022)     Readmission Risk Interventions     No data to display

## 2022-11-11 NOTE — Evaluation (Signed)
Occupational Therapy Evaluation Patient Details Name: Garrett ALESSIO Sr. MRN: [id number] DOB: 22-Jul-1941 Today's Date: 11/11/2022   History of Present Illness Garrett LINVILLE Sr.  81 y.o. male who underwent L2-3 decompressive lumbar laminectomy, hemi facetectomy and foraminotomies on 12/29. PMHx: AAA, a-fib, CAD, cancer, DM, GERD, gout, HLD, HTN, MI, hypothyroid   Clinical Impression   [name] was evaluated s/p the above admission list, he is indep at baseline and lives alone. Upon evaluation he had mild functional deficits due to expected back pain, precautions and knowledge of compensatory techniques. After review of education and safety, he demonstrated mod I ability to complete all ADLs and mobility without AD. He does not required further OT services. Recommend d/c home today.    Recommendations for follow up therapy are one component of a multi-disciplinary discharge planning process, led by the attending physician.  Recommendations may be updated based on patient status, additional functional criteria and insurance authorization.   Follow Up Recommendations  No OT follow up     Assistance Recommended at Discharge PRN  Patient can return home with the following Assist for transportation    Functional Status Assessment  Patient has had a recent decline in their functional status and demonstrates the ability to make significant improvements in function in a reasonable and predictable amount of time.  Equipment Recommendations  None recommended by OT       Precautions / Restrictions Precautions Precautions: Fall;Back Precaution Booklet Issued: Yes (comment) Required Braces or Orthoses: Spinal Brace Spinal Brace: Lumbar corset;Applied in sitting position Restrictions Weight Bearing Restrictions: No      Mobility Bed Mobility Overal bed mobility: Needs Assistance             General bed mobility comments: verbally reviewed log roll, pt OOB upon arrival     Transfers Overall transfer level: Needs assistance Equipment used: None Transfers: Sit to/from Stand Sit to Stand: Independent                  Balance Overall balance assessment: No apparent balance deficits (not formally assessed), Modified Independent                 ADL either performed or assessed with clinical judgement   ADL Overall ADL's : Needs assistance/impaired                 General ADL Comments: generalized supervision A and education given however pt demonstrated mod I ability to complete ADLs without AD.     Vision Baseline Vision/History: 1 Wears glasses Vision Assessment?: No apparent visual deficits     Perception Perception Perception Tested?: No   Praxis Praxis Praxis tested?: Not tested    Pertinent Vitals/Pain Pain Assessment Pain Assessment: Faces Faces Pain Scale: Hurts a little bit Pain Location: sx site Pain Descriptors / Indicators: Discomfort Pain Intervention(s): Limited activity within patient's tolerance, Monitored during session     Hand Dominance Right   Extremity/Trunk Assessment Upper Extremity Assessment Upper Extremity Assessment: Overall WFL for tasks assessed   Lower Extremity Assessment Lower Extremity Assessment: Overall WFL for tasks assessed   Cervical / Trunk Assessment Cervical / Trunk Assessment: Back Surgery   Communication Communication Communication: No difficulties   Cognition Arousal/Alertness: Awake/alert Behavior During Therapy: WFL for tasks assessed/performed Overall Cognitive Status: Within Functional Limits for tasks assessed             General Comments  VSS on RA            Home  Living Family/patient expects to be discharged to:: Private residence Living Arrangements: Alone Available Help at Discharge: Family;Available PRN/intermittently Type of Home: House Home Access: Level entry     Home Layout: One level     Bathroom Shower/Tub: Medical illustrator: Handicapped height     Home Equipment: Conservation officer, nature (2 wheels);Cane - single point          Prior Functioning/Environment Prior Level of Function : Independent/Modified Independent;Driving             Mobility Comments: no AD ADLs Comments: indep        OT Problem List: Decreased activity tolerance;Decreased knowledge of precautions         OT Goals(Current goals can be found in the care plan section) Acute Rehab OT Goals Patient Stated Goal: home OT Goal Formulation: With patient Time For Goal Achievement: 11/25/22 Potential to Achieve Goals: Good   AM-PAC OT "6 Clicks" Daily Activity     Outcome Measure Help from another person eating meals?: None Help from another person taking care of personal grooming?: None Help from another person toileting, which includes using toliet, bedpan, or urinal?: None Help from another person bathing (including washing, rinsing, drying)?: None Help from another person to put on and taking off regular upper body clothing?: None Help from another person to put on and taking off regular lower body clothing?: None 6 Click Score: 24   End of Session Equipment Utilized During Treatment: Back brace Nurse Communication: Mobility status  Activity Tolerance: Patient tolerated treatment well Patient left: in chair;with call bell/phone within reach  OT Visit Diagnosis: Pain                Time: 0354-6568 OT Time Calculation (min): 22 min Charges:  OT General Charges $OT Visit: 1 Visit OT Evaluation $OT Eval Low Complexity: 1 Low    Prabhjot Piscitello D Causey 11/11/2022, 9:03 AM

## 2022-11-11 NOTE — Progress Notes (Signed)
Patient alert and oriented, mae's well, voiding adequate amount of urine, swallowing without difficulty, no c/o pain at time of discharge. Patient discharged home with family. Script and discharged instructions given to patient. Patient and family stated understanding of instructions given. Patient has an appointment with Dr. Jones in 2 weeks ?

## 2022-11-14 ENCOUNTER — Encounter (HOSPITAL_COMMUNITY): Payer: Self-pay | Admitting: Neurological Surgery

## 2022-11-15 ENCOUNTER — Encounter (HOSPITAL_COMMUNITY): Payer: Self-pay | Admitting: Neurological Surgery

## 2022-11-20 ENCOUNTER — Other Ambulatory Visit: Payer: Self-pay | Admitting: Cardiology

## 2022-11-24 ENCOUNTER — Other Ambulatory Visit: Payer: Self-pay | Admitting: Cardiology

## 2022-11-24 DIAGNOSIS — Z951 Presence of aortocoronary bypass graft: Secondary | ICD-10-CM

## 2022-11-24 DIAGNOSIS — Z79899 Other long term (current) drug therapy: Secondary | ICD-10-CM

## 2022-11-24 DIAGNOSIS — E782 Mixed hyperlipidemia: Secondary | ICD-10-CM

## 2022-11-24 DIAGNOSIS — I2510 Atherosclerotic heart disease of native coronary artery without angina pectoris: Secondary | ICD-10-CM

## 2022-11-30 ENCOUNTER — Encounter: Payer: Self-pay | Admitting: Cardiology

## 2022-12-01 MED ORDER — OLMESARTAN MEDOXOMIL-HCTZ 20-12.5 MG PO TABS: 1.0000 | ORAL_TABLET | Freq: Every day | ORAL | 3 refills | Status: DC

## 2022-12-01 NOTE — Telephone Encounter (Signed)
Called and spoke with pt regarding this message.  Pt reports he is not having the heartburn/angina type feeling [facility]  Discomfort resolves on it's own with rest.  He denies any n/v at the present time.  He is also concerned that his BP has been elevated, more so since he had his back surgery.  He is not in pain r/t his back surgery.  BP has been as high as 179/104 recently and not coming down.  He is currently taking Benicar 20 mg daily and Metoprolol tartrate 25 mg BID.  He had previously been taking HCTZ but "kidney doctor" stopped this per his report.  HR has been between 80-90 bpm for the most part.  Pt reports taking SL Ntg for his elevated BP and it did help for a bit.  Advised pt I will contact Dr Marlou Porch to have him review for orders.  Pt added onto Dr Marlou Porch' schedule for Wednesday but aware should chest pain/angina reoccur without relief from SL Ntg, to report to ED for further evaluation.

## 2022-12-01 NOTE — Telephone Encounter (Signed)
Reviewed information with Dr Marlou Porch who gives orders to restart HCTZ 12.5 mg daily.  Pt is aware.  He reports he still has olmesartan 20/12.5 mg on hand ast home and will re-start it.

## 2022-12-04 DIAGNOSIS — N1832 Chronic kidney disease, stage 3b: Secondary | ICD-10-CM | POA: Diagnosis not present

## 2022-12-06 ENCOUNTER — Ambulatory Visit: Payer: Medicare Other | Attending: Cardiology | Admitting: Cardiology

## 2022-12-06 ENCOUNTER — Encounter: Payer: Self-pay | Admitting: Cardiology

## 2022-12-06 VITALS — BP 150/78 | HR 64 | Ht 69.5 in | Wt 187.0 lb

## 2022-12-06 DIAGNOSIS — I7121 Aneurysm of the ascending aorta, without rupture: Secondary | ICD-10-CM | POA: Diagnosis not present

## 2022-12-06 DIAGNOSIS — E1159 Type 2 diabetes mellitus with other circulatory complications: Secondary | ICD-10-CM | POA: Diagnosis not present

## 2022-12-06 DIAGNOSIS — I2089 Other forms of angina pectoris: Secondary | ICD-10-CM | POA: Diagnosis not present

## 2022-12-06 DIAGNOSIS — Z951 Presence of aortocoronary bypass graft: Secondary | ICD-10-CM | POA: Diagnosis not present

## 2022-12-06 DIAGNOSIS — I9789 Other postprocedural complications and disorders of the circulatory system, not elsewhere classified: Secondary | ICD-10-CM | POA: Diagnosis not present

## 2022-12-06 DIAGNOSIS — I152 Hypertension secondary to endocrine disorders: Secondary | ICD-10-CM

## 2022-12-06 MED ORDER — ISOSORBIDE MONONITRATE ER 30 MG PO TB24: 30.0000 mg | ORAL_TABLET | Freq: Every day | ORAL | 3 refills | Status: DC

## 2022-12-06 NOTE — Progress Notes (Signed)
Cardiology Office Note:    Date:  12/06/2022   ID:  Garrett Meek Sr., DOB 1941/07/19, MRN [id number]  PCP:  Antony Contras, MD   [facility] Providers Cardiologist:  Candee Furbish, MD     Referring MD: Antony Contras, MD    History of Present Illness:    Garrett Jordan. is a 82 y.o. male here for follow-up STEMI on 06/15/2022 while holding his Plavix and Eliquis in preparation for back surgery secondary to spinal stenosis.  Cardiac catheterization showed patent LIMA to LAD and acute occlusion of SVG to left posterior lateral branch which was small.  PCI was attempted but unsuccessful.  Chronic occlusion of SVG to PLA noted.  He also had total occlusion of the native circumflex beyond the first OM branch and a subtotal occlusion of the mid RCA.    Ejection fraction reassuring at 55 to 60%.    Ascending aortic aneurysm at 44 mm followed by Dr. Cyndia Bent and Dr. Laurence Ferrari of interventional radiology (endoleak).    CAD prior CABG 2010 abdominal aortic aneurysm stent graft Dr. 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  Patient was last seen by me on 01/23/22 and has since had a left heart cath in August 2023. They had an unsuccessful PCI at that time. He was then seen by PA Harriet Pho on 06/30/22 and was started on Benicar 20-12.'5mg'$  daily. He has 1 month remaining on Plavix before we can discontinue this for repeat attempt at back surgery.  Patient states that he has been on the combination of Plavix and Eliquis prior to the heart cath. He denies any bleeding issues on this regimen.  He is compliant with and tolerating his medications well.   He had back surgery  Dr. Sherley Bounds, neurosurgery and attends physical therapy for his back. He reminds [name] that he was holding his anticoagulants for surgery prep previously when his heart attack occurred.  He noted that when he went to the [facility] and try to walk one  half of a mile he started having chest burning type symptoms.  We are starting isosorbide on 12/06/2022.   Past Medical History:  Diagnosis Date   AAA (abdominal aortic aneurysm) (HCC)    Arthritis    knees   Atrial fibrillation (HCC)    CAD (coronary artery disease)    Cancer (HCC)    prostate   CKD (chronic kidney disease), stage III ([place]) 06/18/2022   COVID    06/2019 and in 2022 states they were mild cases   Diabetes mellitus without complication (HCC)    Dyspnea    once in a while   Dysrhythmia    A-fib pt is on Eliquis   GERD (gastroesophageal reflux disease)    Gout    History of kidney stones    Hyperlipidemia    Hypertension    Hypothyroidism    Myocardial infarction ([place]) 01/1991   sp inferior   Nerve compression    right leg   Reflux    Seasonal allergies    Thoracic ascending aortic aneurysm ([place])    Thyroid disease    hypothyroidism    Past Surgical History:  Procedure Laterality Date   ABDOMINAL AORTAGRAM N/A 02/28/2012   Procedure: ABDOMINAL Maxcine Ham;  Surgeon: Serafina Mitchell, MD;  Location: [facility] CATH LAB;  Service: Cardiovascular;  Laterality: N/A;   abdominal aortagram embolization  02/28/2012   ABDOMINAL AORTIC ANEURYSM REPAIR  07/14/2012   CARDIAC CATHETERIZATION     CATARACT EXTRACTION Bilateral 2016   CORONARY ARTERY BYPASS GRAFT  04/13/2009   CORONARY BALLOON ANGIOPLASTY N/A 06/15/2022   Procedure: CORONARY BALLOON ANGIOPLASTY;  Surgeon: Sherren Mocha, MD;  Location: [facility] CV LAB;  Service: Cardiovascular;  Laterality: N/A;   CORONARY STENT PLACEMENT  1992 and  2009   RCA   EMBOLIZATION Right 02/28/2012   Procedure: EMBOLIZATION;  Surgeon: Serafina Mitchell, MD;  Location: [facility] CATH LAB;  Service: Cardiovascular;  Laterality: Right;   IR GENERIC HISTORICAL  12/05/2016   IR RADIOLOGIST EVAL & MGMT 12/05/2016 Jacqulynn Cadet, MD [facility] INTERV RAD   IR RADIOLOGIST EVAL & MGMT  07/10/2019   IR RADIOLOGIST EVAL & MGMT  08/25/2020   IR  RADIOLOGIST EVAL & MGMT  08/16/2021   IR RADIOLOGIST EVAL & MGMT  08/31/2022   KIDNEY STONE SURGERY     LAMINECTOMY WITH POSTERIOR LATERAL ARTHRODESIS LEVEL 1 Bilateral 11/10/2022   Procedure: Lumbar Two-Lumbar Three laminectomy  - bilateral with posterior lateral fusion and interspinous plating;  Surgeon: Eustace Moore, MD;  Location: [place];  Service: Neurosurgery;  Laterality: Bilateral;   LEFT HEART CATH AND CORONARY ANGIOGRAPHY N/A 06/15/2022   Procedure: LEFT HEART CATH AND CORONARY ANGIOGRAPHY;  Surgeon: Sherren Mocha, MD;  Location: [facility];  Service: Cardiovascular;  Laterality: N/A;   LEFT HEART CATHETERIZATION WITH CORONARY/GRAFT ANGIOGRAM N/A 01/08/2015   Procedure: LEFT HEART CATHETERIZATION WITH Beatrix Fetters;  Surgeon: Jacolyn Reedy, MD;  Location: [facility] CATH LAB;  Service: Cardiovascular;  Laterality: N/A;   parathyroid adenoma  1981   surgery   PROSTATE SURGERY     rad seeds   SPINE SURGERY  2006   TONSILLECTOMY  1955    Current Medications: Current Meds  Medication Sig   ACCU-CHEK GUIDE test strip USE TWICE DAILY   acetaminophen (TYLENOL) 650 MG CR tablet Take 650 mg by mouth every 8 (eight) hours as needed for pain.   allopurinol (ZYLOPRIM) 300 MG tablet Take 300 mg by mouth every other day. on even days of the month   Ascorbic Acid (VITAMIN C) 500 MG CAPS daily at 6 (six) AM.   atorvastatin (LIPITOR) 40 MG tablet Take 1 tablet (40 mg total) by mouth daily.   Cholecalciferol (VITAMIN D3) 2000 UNITS TABS Take 2,000 Units by mouth daily.    clonazePAM (KLONOPIN) 1 MG tablet Take 0.5 mg by mouth at bedtime.   clopidogrel (PLAVIX) 75 MG tablet TAKE 1 TABLET BY MOUTH DAILY   Coenzyme Q10 300 MG CAPS Take 300 mg by mouth daily.   Cyanocobalamin (VITAMIN B-12 PO) Take 2,500 mcg by mouth daily.   dapagliflozin propanediol (FARXIGA) 5 MG TABS tablet Take 1 tablet (5 mg total) by mouth daily before breakfast.   ELIQUIS 5 MG TABS tablet TAKE 1 TABLET BY  MOUTH TWICE A DAY   ezetimibe (ZETIA) 10 MG tablet TAKE 1 TABLET BY MOUTH  DAILY   fenofibrate micronized (LOFIBRA) 200 MG capsule Take 1 capsule (200 mg total) by mouth daily before breakfast.   fluorouracil (EFUDEX) 5 % cream Apply 1 Application topically daily as needed (cancer spots). Use as directed   fluticasone (FLONASE) 50 MCG/ACT nasal spray Place 2 sprays into both nostrils daily as needed for allergies.   glipiZIDE (GLUCOTROL) 5 MG tablet Take 0.5 tablets (2.5 mg total) by mouth daily before breakfast AND 1 tablet (5 mg total) daily before supper.   isosorbide mononitrate (IMDUR) 30 MG  24 hr tablet Take 1 tablet (30 mg total) by mouth daily.   levothyroxine (SYNTHROID, LEVOTHROID) 175 MCG tablet Take 175 mcg by mouth daily before breakfast.   lidocaine (ASPERCREME LIDOCAINE) 4 % Place 1 patch onto the skin daily as needed (pain).   loratadine (CLARITIN) 10 MG tablet Take 10 mg by mouth at bedtime.   magnesium oxide (MAG-OX) 400 (240 Mg) MG tablet Take 400 mg by mouth daily.   metoprolol tartrate (LOPRESSOR) 25 MG tablet TAKE 1 TABLET BY MOUTH TWICE  DAILY   Multiple Vitamins-Minerals (PRESERVISION AREDS 2) CAPS Take 1 capsule by mouth 2 (two) times daily.   nitroGLYCERIN (NITROSTAT) 0.4 MG SL tablet Place 1 tablet (0.4 mg total) under the tongue every 5 (five) minutes as needed for chest pain.   olmesartan-hydrochlorothiazide (BENICAR HCT) 20-12.5 MG tablet Take 1 tablet by mouth daily.   oxyCODONE 10 MG TABS Take 1 tablet (10 mg total) by mouth every 4 (four) hours as needed for severe pain ((score 7 to 10)).   pantoprazole (PROTONIX) 40 MG tablet TAKE 1 TABLET BY MOUTH DAILY   Polyethyl Glycol-Propyl Glycol (SYSTANE OP) Place 1 drop into both eyes daily as needed (dry/irritated eyes).   Tamsulosin HCl (FLOMAX) 0.4 MG CAPS Take 0.4 mg by mouth every other day.   traMADol (ULTRAM) 50 MG tablet Take 50 mg by mouth every 12 (twelve) hours as needed for moderate pain.   triamcinolone  cream (KENALOG) 0.1 % Apply 1 application  topically daily as needed (rash).   TURMERIC PO Take 2 tablets by mouth in the morning and at bedtime.   VASCEPA 1 g capsule TAKE 2 CAPSULES BY MOUTH 2 TIMES DAILY.     Allergies:   Ace inhibitors, Crestor [rosuvastatin], Nitroglycerin, and Quinolones   Social History   Socioeconomic History   Marital status: Divorced    Spouse name: Not on file   Number of children: Not on file   Years of education: 14   Highest education level: Associate degree: academic program  Occupational History   Occupation: Retired  Tobacco Use   Smoking status: Former    Types: Cigarettes    Quit date: 08/01/1970    Years since quitting: 52.3   Smokeless tobacco: Never  Vaping Use   Vaping Use: Never used  Substance and Sexual Activity   Alcohol use: Yes    Alcohol/week: 6.0 standard drinks of alcohol    Types: 3 Glasses of wine, 3 Cans of beer per week    Comment: 1-3 glasses 3-4 times a week   Drug use: No   Sexual activity: Not Currently  Other Topics Concern   Not on file  Social History Narrative   Not on file   Social Determinants of Health   Financial Resource Strain: Not on file  Food Insecurity: Not on file  Transportation Needs: Not on file  Physical Activity: Not on file  Stress: Not on file  Social Connections: Not on file     Family History: The patient's family history includes Deep vein thrombosis in his mother; Diabetes in his maternal grandmother, sister, and sister; Heart attack in his father and sister; Heart disease in his father, paternal uncle, and sister; Hyperlipidemia in his father and sister; Hypertension in his father and sister; Stroke in his mother.  ROS:   Please see the history of present illness.     All other systems reviewed and are negative.  EKGs/Labs/Other Studies Reviewed:    The following studies were reviewed  today:  Cardiac catheterization 2016 - Occluded SVG to PDA.  Other grafts patent.  Medical  management.  CT of chest 02/2019 2-4.1 cm ascending thoracic aorta  Nuclear stress test 2021-low risk study basal inferior mid inferior defect.  EF 50%  ZIO monitor 2021-sinus pause 3.1 to 3.8 seconds.  This is during daytime hours.  We stopped his metoprolol because of the pauses.  EP saw him as well.  He is now back on low-dose metoprolol  NM Stress Test 05/31/2022   The study is normal. The study is low risk.   No ST deviation was noted.   Left ventricular function is normal. Nuclear stress EF: 61 %. The left ventricular ejection fraction is normal (55-65%). End diastolic cavity size is normal.   Prior study available for comparison from 03/10/2020.  Cardiac Catheterization 06/15/2022 1.  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  Status post CABG with continued patency of the LIMA to LAD, saphenous vein graft to acute marginal, and saphenous vein graft to PDA with extensive diffuse degenerative changes. 3.  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  Acute occlusion of the saphenous vein graft to left posterolateral branch with extensive degenerative changes and unsuccessful PCI due to inability to restore TIMI-3 flow in an extensively degenerated graft 5.  Normal LVEDP   Echo 06/15/2022  1. Left ventricular ejection fraction, by estimation, is 55 to 60%. The  left ventricle has normal function. The left ventricle demonstrates global  hypokinesis. There is mild concentric left ventricular hypertrophy. Left  ventricular diastolic parameters  are indeterminate.   2. Right ventricular systolic function is mildly reduced. The right  ventricular size is normal.   3. Left atrial size was moderately dilated.   4. Right atrial size was moderately dilated.   5. The mitral valve is normal in structure. No evidence of mitral  valve  regurgitation. No evidence of mitral stenosis.   6. Tricuspid valve regurgitation is moderate.   7. The aortic valve is normal in structure. Aortic valve regurgitation is  not visualized. No aortic stenosis is present.   8. Aneurysm of the ascending aorta, measuring 44 mm. There is mild  dilatation of the aortic root, measuring 39 mm.   9. The inferior vena cava is normal in size with greater than 50%  respiratory variability, suggesting right atrial pressure of 3 mmHg.   EKG: As above  Recent Labs: 06/17/2022: Magnesium 1.9 08/11/2022: ALT 27 10/31/2022: BUN 16; Creatinine, Ser 1.65; Hemoglobin 14.8; Platelets 144; Potassium 4.1; Sodium 137  Recent Lipid Panel    Component Value Date/Time   CHOL 99 (L) 08/11/2022 1057   TRIG 192 (H) 08/11/2022 1057   HDL 32 (L) 08/11/2022 1057   CHOLHDL 3.1 08/11/2022 1057   CHOLHDL 5.0 06/15/2022 0755   VLDL 74 (H) 06/15/2022 0755   LDLCALC 36 08/11/2022 1057   LDLDIRECT 67 07/15/2021 0912     Risk Assessment/Calculations:              Physical Exam:    VS:  BP (!) 150/78   Pulse 64   Ht 5' 9.5" (1.765 m)   Wt 187 lb (84.8 kg)   SpO2 97%   BMI 27.22 kg/m     Wt Readings from Last 3 Encounters:  12/06/22 187 lb (84.8 kg)  11/10/22 187  lb (84.8 kg)  10/31/22 197 lb 12.8 oz (89.7 kg)     GEN:  Well nourished, well developed in no acute distress HEENT: Normal NECK: No JVD; No carotid bruits LYMPHATICS: No lymphadenopathy CARDIAC: Irregularly irregular , no murmurs, no rubs, gallops RESPIRATORY:  Clear to auscultation without rales, wheezing or rhonchi  ABDOMEN: Soft, non-tender, non-distended MUSCULOSKELETAL:  No edema; No deformity  SKIN: Warm and dry NEUROLOGIC:  Alert and oriented x 3 PSYCHIATRIC:  Normal affect no change  ASSESSMENT:    1. Hypertension associated with diabetes ([place])   2. Aneurysm of ascending aorta without rupture ([place])   3. Type II endoleak of aortic graft   4. S/P CABG (coronary artery  bypass graft)   5. Stable angina      PLAN:    In order of problems listed above:  Completed back surgery From prior note: - Complicated situation.  Prior Plavix hold and Eliquis hold resulted in ST elevation myocardial infarction of a small posterior lateral branch of the RCA vein graft.  Unsuccessful PCI.  Cardiac catheterization reviewed.  Personally discussed with interventional list on his case, Dr. Ali Lowe.  We are going to continue his Plavix for a total of 3 months.  At that point, it would be reasonable to once again attempt back surgery.  He had a low risk nuclear stress test prior to his STEMI.  This indicates that the posterior lateral branch involved was very small in territory and overall low risk.  His EF also was preserved at 60%.  He may proceed with moderate overall cardiac risk.  He understands that there may be potential chance again for thrombosis to occur.  In consultation with interventional team, he would not require Lovenox or to be 3A inhibitor/cangrelor etc. prior to surgery. He understands his potential risks but also understands that his back pain is severe.  He is willing to proceed. -He did successfully complete his back surgery without cardiac complication.  He does tell me that he may have dental implants in his future.  We will cross that bridge when we get there.  STEMI -As described above.    Chronic stable angina - We will go ahead and start isosorbide 30 mg once a day.  Note, with IV nitroglycerin his blood pressures would drop.  Explained to him that this is a long-acting nitrate, hopefully will not have any severe drops in blood pressure.  His blood pressures have been quite elevated, could benefit from this.  Hypertension - Blood pressures have been elevated since the 17th through the 24th.  180/103 at some point here today 150/78.  Starting isosorbide 30 mg once a day as antianginal as well.  We will have him see the pharmacy lead hypertension clinic  soon.  Chronic kidney disease stage IIIb - Creatinine from 1.8-2.1.  He sees Dr. Candiss Norse with nephrology.  Diabetes with chronic kidney disease - Hemoglobin A1c 6.7.  His home [place] was held previously secondary to elevated creatinine and decreased GFR.  Per primary team.  AAA status post EVAR Ascending aortic aneurysm - On Plavix.  Dr. Laurence Ferrari with interventional radiology has been monitoring endoleak. -Dr. Cyndia Bent has been monitoring his 19 mm ascending aortic aneurysm. - We consolidated the CT scan to evaluate both his ascending and descending aorta and 1 scan.  Sent the information to both physicians for review. -Need to try to continue to improve blood pressure control.        Medication Adjustments/Labs and Tests Ordered: Current medicines  are reviewed at length with the patient today.  Concerns regarding medicines are outlined above.  Orders Placed This Encounter  Procedures   AMB Referral to Heartcare Pharm-D   Meds ordered this encounter  Medications   isosorbide mononitrate (IMDUR) 30 MG 24 hr tablet    Sig: Take 1 tablet (30 mg total) by mouth daily.    Dispense:  90 tablet    Refill:  3    Patient Instructions  Medication Instructions:  Your physician has recommended you make the following change in your medication:   1) START isosorbide mononitrate (Imdur) '30mg'$  once daily  *If you need a refill on your cardiac medications before your next appointment, please call your pharmacy*  Lab Work: NONE  Testing/Procedures: NONE  Follow-Up: At [facility], you and your health needs are our priority.  As part of our continuing mission to provide you with exceptional heart care, we have created designated Provider Care Teams.  These Care Teams include your primary Cardiologist (physician) and Advanced Practice Providers (APPs -  Physician Assistants and Nurse Practitioners) who all work together to provide you with the care you need, when you need  it.  Your next appointment:   Next available or 1 month with Pharm D (Hypertension Clinic) 6 month(s)  Provider:   Candee Furbish, MD      Signed, Candee Furbish, MD  12/06/2022 12:12 PM    [place]

## 2022-12-06 NOTE — Patient Instructions (Signed)
Medication Instructions:  Your physician has recommended you make the following change in your medication:   1) START isosorbide mononitrate (Imdur) '30mg'$  once daily  *If you need a refill on your cardiac medications before your next appointment, please call your pharmacy*  Lab Work: NONE  Testing/Procedures: NONE  Follow-Up: At [facility], you and your health needs are our priority.  As part of our continuing mission to provide you with exceptional heart care, we have created designated Provider Care Teams.  These Care Teams include your primary Cardiologist (physician) and Advanced Practice Providers (APPs -  Physician Assistants and Nurse Practitioners) who all work together to provide you with the care you need, when you need it.  Your next appointment:   Next available or 1 month with Pharm D (Hypertension Clinic) 6 month(s)  Provider:   Candee Furbish, MD

## 2022-12-12 ENCOUNTER — Encounter: Payer: Self-pay | Admitting: Cardiology

## 2022-12-14 DIAGNOSIS — I129 Hypertensive chronic kidney disease with stage 1 through stage 4 chronic kidney disease, or unspecified chronic kidney disease: Secondary | ICD-10-CM | POA: Diagnosis not present

## 2022-12-14 DIAGNOSIS — Z9889 Other specified postprocedural states: Secondary | ICD-10-CM | POA: Diagnosis not present

## 2022-12-14 DIAGNOSIS — N1832 Chronic kidney disease, stage 3b: Secondary | ICD-10-CM | POA: Diagnosis not present

## 2022-12-14 DIAGNOSIS — N2581 Secondary hyperparathyroidism of renal origin: Secondary | ICD-10-CM | POA: Diagnosis not present

## 2022-12-14 DIAGNOSIS — Z9089 Acquired absence of other organs: Secondary | ICD-10-CM | POA: Diagnosis not present

## 2022-12-14 DIAGNOSIS — D631 Anemia in chronic kidney disease: Secondary | ICD-10-CM | POA: Diagnosis not present

## 2022-12-15 MED ORDER — AMLODIPINE BESYLATE 10 MG PO TABS: 10.0000 mg | ORAL_TABLET | Freq: Every day | ORAL | 3 refills | Status: DC

## 2022-12-18 ENCOUNTER — Other Ambulatory Visit: Payer: Self-pay | Admitting: Internal Medicine

## 2022-12-26 DIAGNOSIS — M4316 Spondylolisthesis, lumbar region: Secondary | ICD-10-CM | POA: Diagnosis not present

## 2023-01-08 ENCOUNTER — Encounter: Payer: Self-pay | Admitting: Cardiology

## 2023-01-16 ENCOUNTER — Ambulatory Visit: Payer: Medicare Other | Attending: Cardiology | Admitting: Pharmacist

## 2023-01-16 VITALS — BP 128/80 | HR 65

## 2023-01-16 DIAGNOSIS — I119 Hypertensive heart disease without heart failure: Secondary | ICD-10-CM

## 2023-01-16 MED ORDER — OMRON 3 SERIES BP MONITOR DEVI: 1.0000 | Freq: Every day | 0 refills | Status: DC

## 2023-01-16 NOTE — Progress Notes (Signed)
Patient ID: Garrett Jordan                 DOB: [birth year]                      MRN: [id number]      HPI: Garrett Jordan. is a 82 y.o. male referred by Dr. Marlou Porch to HTN clinic. PMH is significant for STEMI 8/23, prior CABG in 2010, afib, HLD and HTN. Amlodipine and olmesartan 40/'25mg'$  was d/c at time of discharge from his NSTEMI in August.    Patient sent a mychart message about elevated blood pressures in Jan 24. Olmesartan/HCTZ was resumed at 20/12.'5mg'$  daily and Isosorbide was added at apt in Jan with Dr. Marlou Porch. Amlodipine '10mg'$  was resumed in Feb.   Patient presents today to HTN clinic. He reports his home BP have been about 150/90 in the evenings. He has an [employer] life upper arm cuff. He states that when he was in here previously, he compared his cuff to our clinic cuff and his cuff was 20 points higher. He denies any dizziness, lightheadedness, headache, blurred vision, SOB, swelling. Still some angina while walking. He has to stop about every 3-4 labs for just a few min then he can resume walking.  Tolerating isosorbide well now. Was having headaches but they are gone. He does take 3 APAP (500 or 650/tablet) daily for aches and pains. Of note his BP was a little higher in his left arm than right.  Right arm 120/70 Left arm 128/80   Current HTN meds: metoprolol tartrate '25mg'$  twice a day, olmesartan 20/12.'5mg'$  daily, isosorbide '30mg'$  daily, amlodipine '10mg'$  daily Previously tried: HCTZ (stopped by nephrology) BP goal: <130/80  Family History: The patient's family history includes Deep vein thrombosis in his mother; Diabetes in his maternal grandmother, sister, and sister; Heart attack in his father and sister; Heart disease in his father, paternal uncle, and sister; Hyperlipidemia in his father and sister; Hypertension in his father and sister; Stroke in his mother.   Social History: no tobacco, wine or beer- 1-2 beers per week and 1-2 glass wine per week  Diet: coffee- 1 cup per day,  no soft drinks, milk, oj, sparkling water, very seldom black tea Does add salt to food, eats out 2 times per week, adds salt to food. Frozen dinners, seasoned vegetables, stir fry   Exercise: [employer] 3 times a week, walking -has to rest every 3-4 laps due to angina  Home BP readings: ~150/90  Wt Readings from Last 3 Encounters:  12/06/22 187 lb (84.8 kg)  11/10/22 187 lb (84.8 kg)  10/31/22 197 lb 12.8 oz (89.7 kg)   BP Readings from Last 3 Encounters:  01/16/23 128/80  12/06/22 (!) 150/78  11/11/22 124/74   Pulse Readings from Last 3 Encounters:  01/16/23 65  12/06/22 64  11/11/22 69    Renal function: CrCl cannot be calculated (Patient's most recent lab result is older than the maximum 21 days allowed.).  Past Medical History:  Diagnosis Date   AAA (abdominal aortic aneurysm) (HCC)    Arthritis    knees   Atrial fibrillation (HCC)    CAD (coronary artery disease)    Cancer ([place])    prostate   CKD (chronic kidney disease), stage III ([place]) 06/18/2022   COVID    06/2019 and in 2022 states they were mild cases   Diabetes mellitus without complication ([place])    Dyspnea    once in  a while   Dysrhythmia    A-fib pt is on Eliquis   GERD (gastroesophageal reflux disease)    Gout    History of kidney stones    Hyperlipidemia    Hypertension    Hypothyroidism    Myocardial infarction ([place]) 01/1991   sp inferior   Nerve compression    right leg   Reflux    Seasonal allergies    Thoracic ascending aortic aneurysm (HCC)    Thyroid disease    hypothyroidism    Current Outpatient Medications on File Prior to Visit  Medication Sig Dispense Refill   amLODipine (NORVASC) 10 MG tablet Take 1 tablet (10 mg total) by mouth daily. 90 tablet 3   ezetimibe (ZETIA) 10 MG tablet TAKE 1 TABLET BY MOUTH  DAILY 90 tablet 3   fenofibrate micronized (LOFIBRA) 200 MG capsule TAKE 1 CAPSULE BY MOUTH DAILY  BEFORE BREAKFAST 100 capsule 2   metoprolol tartrate (LOPRESSOR) 25 MG tablet TAKE  1 TABLET BY MOUTH TWICE  DAILY 180 tablet 2   VASCEPA 1 g capsule TAKE 2 CAPSULES BY MOUTH 2 TIMES DAILY. 360 capsule 3   ACCU-CHEK GUIDE test strip USE TWICE DAILY 200 strip 3   acetaminophen (TYLENOL) 650 MG CR tablet Take 650 mg by mouth every 8 (eight) hours as needed for pain.     allopurinol (ZYLOPRIM) 300 MG tablet Take 300 mg by mouth every other day. on even days of the month     Ascorbic Acid (VITAMIN C) 500 MG CAPS daily at 6 (six) AM.     atorvastatin (LIPITOR) 40 MG tablet Take 1 tablet (40 mg total) by mouth daily. 30 tablet 11   Cholecalciferol (VITAMIN D3) 2000 UNITS TABS Take 2,000 Units by mouth daily.      clonazePAM (KLONOPIN) 1 MG tablet Take 0.5 mg by mouth at bedtime.     clopidogrel (PLAVIX) 75 MG tablet TAKE 1 TABLET BY MOUTH DAILY 100 tablet 1   Coenzyme Q10 300 MG CAPS Take 300 mg by mouth daily.     Cyanocobalamin (VITAMIN B-12 PO) Take 2,500 mcg by mouth daily.     dapagliflozin propanediol (FARXIGA) 5 MG TABS tablet Take 1 tablet (5 mg total) by mouth daily before breakfast. 90 tablet 2   ELIQUIS 5 MG TABS tablet TAKE 1 TABLET BY MOUTH TWICE A DAY 180 tablet 1   fluorouracil (EFUDEX) 5 % cream Apply 1 Application topically daily as needed (cancer spots). Use as directed     fluticasone (FLONASE) 50 MCG/ACT nasal spray Place 2 sprays into both nostrils daily as needed for allergies.     glipiZIDE (GLUCOTROL) 5 MG tablet Take 0.5 tablets (2.5 mg total) by mouth daily before breakfast AND 1 tablet (5 mg total) daily before supper. 135 tablet 3   isosorbide mononitrate (IMDUR) 30 MG 24 hr tablet Take 1 tablet (30 mg total) by mouth daily. 90 tablet 3   levothyroxine (SYNTHROID, LEVOTHROID) 175 MCG tablet Take 175 mcg by mouth daily before breakfast.     lidocaine (ASPERCREME LIDOCAINE) 4 % Place 1 patch onto the skin daily as needed (pain).     loratadine (CLARITIN) 10 MG tablet Take 10 mg by mouth at bedtime.     magnesium oxide (MAG-OX) 400 (240 Mg) MG tablet Take 400  mg by mouth daily.     Multiple Vitamins-Minerals (PRESERVISION AREDS 2) CAPS Take 1 capsule by mouth 2 (two) times daily.     nitroGLYCERIN (NITROSTAT) 0.4 MG SL  tablet Place 1 tablet (0.4 mg total) under the tongue every 5 (five) minutes as needed for chest pain. 25 tablet 3   olmesartan-hydrochlorothiazide (BENICAR HCT) 20-12.5 MG tablet Take 1 tablet by mouth daily. 90 tablet 3   oxyCODONE 10 MG TABS Take 1 tablet (10 mg total) by mouth every 4 (four) hours as needed for severe pain ((score 7 to 10)). 30 tablet 0   pantoprazole (PROTONIX) 40 MG tablet TAKE 1 TABLET BY MOUTH DAILY 90 tablet 3   Polyethyl Glycol-Propyl Glycol (SYSTANE OP) Place 1 drop into both eyes daily as needed (dry/irritated eyes).     Tamsulosin HCl (FLOMAX) 0.4 MG CAPS Take 0.4 mg by mouth every other day.     traMADol (ULTRAM) 50 MG tablet Take 50 mg by mouth every 12 (twelve) hours as needed for moderate pain.     triamcinolone cream (KENALOG) 0.1 % Apply 1 application  topically daily as needed (rash).  2   TURMERIC PO Take 2 tablets by mouth in the morning and at bedtime.     No current facility-administered medications on file prior to visit.    Allergies  Allergen Reactions   Ace Inhibitors Cough   Crestor [Rosuvastatin] Other (See Comments)    Muscle aches   Nitroglycerin Other (See Comments)    Very pronounced lowered BP with IV nitro for caths   Quinolones Other (See Comments)    Aortic root enlargement    Blood pressure 128/80, pulse 65, SpO2 96 %.   Assessment/Plan:  1. Hypertension -  Hypertensive heart disease without CHF Assessment: Patients blood pressure at goal of <130/80 He reports much higher readings at home, but accuracy of home cuff questionable He walks 3 days a week at the [facility] He consumes a good deal of sodium- frozen meals, adds salt to food  Plan: I have asked him to get a new BP cuff Continue monitoring BP at home I have asked him to send me readings via mychart in 2  weeks Continue metoprolol tartrate '25mg'$  twice a day, olmesartan 20/12.'5mg'$  daily, isosorbide '30mg'$  daily, amlodipine '10mg'$  daily Decrease sodium intake. We talked about foods high in Na Follow up in 2 weeks with BP readings from new cuff.    Thank you  Ramond Dial, Pharm.D, BCPS, CPP [facility] [address], [place], [place] [address]  Phone: [phone]; Fax: [phone]

## 2023-01-16 NOTE — Patient Instructions (Signed)
Summary of today's discussion  Please pick up a new blood pressure medication  2. If your insurance wont cover blood pressure cuff, [facility] have them for sale for $35  3. Continue metoprolol tartrate '25mg'$  twice a day, olmesartan 20/12.'5mg'$  daily, isosorbide '30mg'$  daily, amlodipine '10mg'$  daily  4. Sent me blood pressure readings in 2 weeks once you get a new cuff  5. Try to cut back on sodium. Goal is '2300mg'$  per day.   Your blood pressure goal is <130/80  To check your pressure at home you will need to:  1. Sit up in a chair, with feet flat on the floor and back supported. Do not cross your ankles or legs. 2. Rest your left arm so that the cuff is about heart level. If the cuff goes on your upper arm,  then just relax the arm on the table, arm of the chair or your lap. If you have a wrist cuff, we  suggest relaxing your wrist against your chest (think of it as Pledging the Flag with the  wrong arm).  3. Place the cuff snugly around your arm, about 1 inch above the crook of your elbow. The  cords should be inside the groove of your elbow.  4. Sit quietly, with the cuff in place, for about 5 minutes. After that 5 minutes press the power  button to start a reading. 5. Do not talk or move while the reading is taking place.  6. Record your readings on a sheet of paper. Although most cuffs have a memory, it is often  easier to see a pattern developing when the numbers are all in front of you.  7. You can repeat the reading after 1-3 minutes if it is recommended  Make sure your bladder is empty and you have not had caffeine or tobacco within the last 30 min  Always bring your blood pressure log with you to your appointments. If you have not brought your monitor in to be double checked for accuracy, please bring it to your next appointment.  You can find a list of validated (accurate) blood pressure cuffs at [employer]   Important lifestyle changes to control high blood pressure   Intervention  Effect on the BP  Lose extra pounds and watch your waistline Weight loss is one of the most effective lifestyle changes for controlling blood pressure. If you're overweight or obese, losing even a small amount of weight can help reduce blood pressure. Blood pressure might go down by about 1 millimeter of mercury (mm Hg) with each kilogram (about 2.2 pounds) of weight lost.  Exercise regularly As a general goal, aim for at least 30 minutes of moderate physical activity every day. Regular physical activity can lower high blood pressure by about 5 to 8 mm Hg.  Eat a healthy diet Eating a diet rich in whole grains, fruits, vegetables, and low-fat dairy products and low in saturated fat and cholesterol. A healthy diet can lower high blood pressure by up to 11 mm Hg.  Reduce salt (sodium) in your diet Even a small reduction of sodium in the diet can improve heart health and reduce high blood pressure by about 5 to 6 mm Hg.  Limit alcohol One drink equals 12 ounces of beer, 5 ounces of wine, or 1.5 ounces of 80-proof liquor.  Limiting alcohol to less than one drink a day for women or two drinks a day for men can help lower blood pressure by about 4 mm Hg.  Please call me at [phone] with any questions.

## 2023-01-16 NOTE — Assessment & Plan Note (Signed)
Assessment: Patients blood pressure at goal of <130/80 He reports much higher readings at home, but accuracy of home cuff questionable He walks 3 days a week at the [facility] He consumes a good deal of sodium- frozen meals, adds salt to food  Plan: I have asked him to get a new BP cuff Continue monitoring BP at home I have asked him to send me readings via mychart in 2 weeks Continue metoprolol tartrate '25mg'$  twice a day, olmesartan 20/12.'5mg'$  daily, isosorbide '30mg'$  daily, amlodipine '10mg'$  daily Decrease sodium intake. We talked about foods high in Na Follow up in 2 weeks with BP readings from new cuff.

## 2023-01-22 ENCOUNTER — Encounter: Payer: Self-pay | Admitting: Cardiology

## 2023-01-22 MED ORDER — CLOPIDOGREL BISULFATE 75 MG PO TABS: 75.0000 mg | ORAL_TABLET | Freq: Every day | ORAL | 3 refills | Status: DC

## 2023-01-26 ENCOUNTER — Ambulatory Visit: Payer: Medicare Other | Admitting: Cardiology

## 2023-01-30 DIAGNOSIS — H3530 Unspecified macular degeneration: Secondary | ICD-10-CM | POA: Diagnosis not present

## 2023-01-30 DIAGNOSIS — Z961 Presence of intraocular lens: Secondary | ICD-10-CM | POA: Diagnosis not present

## 2023-02-06 ENCOUNTER — Encounter: Payer: Self-pay | Admitting: Pharmacist

## 2023-02-06 DIAGNOSIS — H43812 Vitreous degeneration, left eye: Secondary | ICD-10-CM | POA: Diagnosis not present

## 2023-02-06 DIAGNOSIS — H353132 Nonexudative age-related macular degeneration, bilateral, intermediate dry stage: Secondary | ICD-10-CM | POA: Diagnosis not present

## 2023-02-06 DIAGNOSIS — H43821 Vitreomacular adhesion, right eye: Secondary | ICD-10-CM | POA: Diagnosis not present

## 2023-02-06 DIAGNOSIS — H43392 Other vitreous opacities, left eye: Secondary | ICD-10-CM | POA: Diagnosis not present

## 2023-02-06 DIAGNOSIS — I480 Paroxysmal atrial fibrillation: Secondary | ICD-10-CM

## 2023-02-06 DIAGNOSIS — E119 Type 2 diabetes mellitus without complications: Secondary | ICD-10-CM | POA: Diagnosis not present

## 2023-02-06 DIAGNOSIS — H35372 Puckering of macula, left eye: Secondary | ICD-10-CM | POA: Diagnosis not present

## 2023-02-20 NOTE — Progress Notes (Unsigned)
Cardiology Office Note:    Date:  02/20/2023   ID:  Garrett Graves Sr., DOB 11-Feb-1941, MRN [id number]  PCP:  [name], MD   [facility] Providers Cardiologist:  Donato Schultz, MD { Click to update primary MD,subspecialty MD or APP then REFRESH:1}    Referring MD: [name], MD   Chief Complaint: ***  History of Present Illness:    Garrett Graves Sr. is a *** 82 y.o. male with a hx of CAD, ascending aortic aneurysm, CKD followed by nephrology, diabetes  CAD s/p CABG 2010 with abdominal aortic aneurysm stent graft with Dr. Myra Gianotti with residual penetrating aortic ulcer in the lesser curve of the arch asymptomatic at 41 mm.   ZIO monitor 2021 with sinus pause 3.1 to 3.8 seconds during daytime hours.  Metoprolol was stopped because of the pauses.  EP saw him as well.  He later resumed low-dose metoprolol.  History of ascending aortic aneurysm at 44 mL followed by Dr. Laneta Simmers and Dr. Archer Asa of interventional radiology (endoleak)  STEMI 06/15/2022 while holding Plavix and Eliquis in preparation for back surgery secondary to spinal stenosis.  Cardiac catheterization showed patent LIMA to LAD and acute occlusion of SVG to left posterior lateral branch which was small.  PCI was attempted but unsuccessful.  Chronic occlusion of SVG to PLA noted.  He also had total occlusion of native circumflex beyond the first OM branch and a subtotal occlusion of the mid RCA.  EF 55 to 60%.  Decision to continue Plavix uninterrupted for 3 months at which point it would be reasonable to once again attempt back surgery.  He had a low risk nuclear stress test prior to STEMI.  He was advised he is moderate overall cardiac risk.  Seen in clinic 12/06/2022 by Dr. Anne Fu.  BP remained elevated and he was referred to hypertension clinic.  He was started on isosorbide mononitrate 30 mg daily.  Seen by Malena Peer, RPH on 01/16/23.  BP meds included metoprolol 25 mg twice daily, olmesartan/HCTZ, amlodipine 10  mg daily, and isosorbide. Reported home BP remained elevated.  He was asked to get a new home BP cuff and send readings in 2 weeks.  Encouraged to reduce sodium intake and return in 2 weeks for follow-up.  Today,   Past Medical History:  Diagnosis Date   AAA (abdominal aortic aneurysm) (HCC)    Arthritis    knees   Atrial fibrillation (HCC)    CAD (coronary artery disease)    Cancer (HCC)    prostate   CKD (chronic kidney disease), stage III (HCC) 06/18/2022   COVID    06/2019 and in 2022 states they were mild cases   Diabetes mellitus without complication (HCC)    Dyspnea    once in a while   Dysrhythmia    A-fib pt is on Eliquis   GERD (gastroesophageal reflux disease)    Gout    History of kidney stones    Hyperlipidemia    Hypertension    Hypothyroidism    Myocardial infarction (HCC) 01/1991   sp inferior   Nerve compression    right leg   Reflux    Seasonal allergies    Thoracic ascending aortic aneurysm (HCC)    Thyroid disease    hypothyroidism    Past Surgical History:  Procedure Laterality Date   ABDOMINAL AORTAGRAM N/A 02/28/2012   Procedure: ABDOMINAL Ronny Flurry;  Surgeon: Nada Libman, MD;  Location: [facility] CATH LAB;  Service: Cardiovascular;  Laterality: N/A;   abdominal aortagram embolization  02/28/2012   ABDOMINAL AORTIC ANEURYSM REPAIR  07/14/2012   CARDIAC CATHETERIZATION     CATARACT EXTRACTION Bilateral 2016   CORONARY ARTERY BYPASS GRAFT  04/13/2009   CORONARY BALLOON ANGIOPLASTY N/A 06/15/2022   Procedure: CORONARY BALLOON ANGIOPLASTY;  Surgeon: Tonny Bollman, MD;  Location: [facility] INVASIVE CV LAB;  Service: Cardiovascular;  Laterality: N/A;   CORONARY STENT PLACEMENT  1992 and  2009   RCA   EMBOLIZATION Right 02/28/2012   Procedure: EMBOLIZATION;  Surgeon: Nada Libman, MD;  Location: [facility] CATH LAB;  Service: Cardiovascular;  Laterality: Right;   IR GENERIC HISTORICAL  12/05/2016   IR RADIOLOGIST EVAL & MGMT 12/05/2016 Malachy Moan, MD  [facility] INTERV RAD   IR RADIOLOGIST EVAL & MGMT  07/10/2019   IR RADIOLOGIST EVAL & MGMT  08/25/2020   IR RADIOLOGIST EVAL & MGMT  08/16/2021   IR RADIOLOGIST EVAL & MGMT  08/31/2022   KIDNEY STONE SURGERY     LAMINECTOMY WITH POSTERIOR LATERAL ARTHRODESIS LEVEL 1 Bilateral 11/10/2022   Procedure: Lumbar Two-Lumbar Three laminectomy  - bilateral with posterior lateral fusion and interspinous plating;  Surgeon: Tia Alert, MD;  Location: [facility] OR;  Service: Neurosurgery;  Laterality: Bilateral;   LEFT HEART CATH AND CORONARY ANGIOGRAPHY N/A 06/15/2022   Procedure: LEFT HEART CATH AND CORONARY ANGIOGRAPHY;  Surgeon: Tonny Bollman, MD;  Location: [facility] INVASIVE CV LAB;  Service: Cardiovascular;  Laterality: N/A;   LEFT HEART CATHETERIZATION WITH CORONARY/GRAFT ANGIOGRAM N/A 01/08/2015   Procedure: LEFT HEART CATHETERIZATION WITH Isabel Caprice;  Surgeon: Othella Boyer, MD;  Location: [facility] CATH LAB;  Service: Cardiovascular;  Laterality: N/A;   parathyroid adenoma  1981   surgery   PROSTATE SURGERY     rad seeds   SPINE SURGERY  2006   TONSILLECTOMY  1955    Current Medications: No outpatient medications have been marked as taking for the 02/21/23 encounter (Appointment) with Lissa Hoard, Zachary George, NP.     Allergies:   Ace inhibitors, Crestor [rosuvastatin], Nitroglycerin, and Quinolones   Social History   Socioeconomic History   Marital status: Divorced    Spouse name: Not on file   Number of children: Not on file   Years of education: 14   Highest education level: Associate degree: academic program  Occupational History   Occupation: Retired  Tobacco Use   Smoking status: Former    Types: Cigarettes    Quit date: 08/01/1970    Years since quitting: 52.5   Smokeless tobacco: Never  Vaping Use   Vaping Use: Never used  Substance and Sexual Activity   Alcohol use: Yes    Alcohol/week: 6.0 standard drinks of alcohol    Types: 3 Glasses of wine, 3 Cans of beer per week     Comment: 1-3 glasses 3-4 times a week   Drug use: No   Sexual activity: Not Currently  Other Topics Concern   Not on file  Social History Narrative   Not on file   Social Determinants of Health   Financial Resource Strain: Not on file  Food Insecurity: Not on file  Transportation Needs: Not on file  Physical Activity: Not on file  Stress: Not on file  Social Connections: Not on file     Family History: The patient's ***family history includes Deep vein thrombosis in his mother; Diabetes in his maternal grandmother, sister, and sister; Heart attack in his father and sister; Heart disease in his father, paternal uncle,  and sister; Hyperlipidemia in his father and sister; Hypertension in his father and sister; Stroke in his mother.  ROS:   Please see the history of present illness.    *** All other systems reviewed and are negative.  Labs/Other Studies Reviewed:    The following studies were reviewed today:  Cardiac Studies & Procedures   CARDIAC CATHETERIZATION  CARDIAC CATHETERIZATION 06/15/2022  Narrative 1.  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  Status post CABG with continued patency of the LIMA to LAD, saphenous vein graft to acute marginal, and saphenous vein graft to PDA with extensive diffuse degenerative changes. 3.  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  Acute occlusion of the saphenous vein graft to left posterolateral branch with extensive degenerative changes and unsuccessful PCI due to inability to restore TIMI-3 flow in an extensively degenerated graft 5.  Normal LVEDP  Plan: Patient loaded with clopidogrel 600 mg prior to the procedure, would continue at least 3 months and he will need to delay his planned back surgery for this duration.  Resume apixaban tomorrow morning.   Check 2D echocardiogram.  Post MI CV-ICU care, anticipate hospital discharge in 48 hours if no complications arise.  Findings Coronary Findings Diagnostic  Dominance: Right  Left Anterior Descending Ost LAD to Prox LAD lesion is 100% stenosed.  Left Circumflex Mid Cx lesion is 100% stenosed.  First Obtuse Marginal Branch The circumflex/intermediate branch is patent and unchanged from the previous cardiac catheterization study in 2016.  The AV circumflex just beyond the high OM/intermediate is occluded.  Right Coronary Artery Prox RCA to Mid RCA lesion is 100% stenosed. The lesion was previously treated . The RCA is subtotally occluded in the mid vessel within the previously implanted stent  LIMA LIMA Graft To Mid LAD LIMA.  The LIMA to mid LAD graft is widely patent.  There is a remaining proximal branch to the chest wall.  The graft fills the LAD and also retrograde fills the first diagonal branch.  The mid and distal LAD appear to be diffusely diseased.  Saphenous Graft To Acute Mrg SVG.  The saphenous vein graft to acute marginal branch is patent and unchanged from the prior procedure.  The graft is small.  Graft To 1st RPL Origin to Prox Graft lesion is 100% stenosed. This graft is chronically occluded  Saphenous Graft To RPDA SVG.  The graft exhibits severe . The SVG to PDA is severely degenerated.  There are aneurysmal segments and areas of moderate stenosis in the mid and distal body of the graft.  Flow is slow through the graft.  Saphenous Graft To 3rd Mrg SVG.  The saphenous vein graft to the left posterolateral branch is totally occluded with contrast staining in the mid body of the graft and diffuse degenerated changes throughout the proximal and mid body of the graft.  Contrast stains in a pattern of acute occlusion. Prox Graft to Mid Graft lesion is 100% stenosed.  Intervention  Prox Graft to Mid Graft lesion (Saphenous Graft To 3rd Mrg) Angioplasty CATHETER  LAUNCHER 6FR AL2 guide catheter was inserted. WIRE HI TORQ WHISPER MS 190CM guidewire used to cross lesion. Balloon angioplasty was performed using a BALLN SAPPHIRE 2.0X15. Maximum pressure: 12 atm. The graft is cannulated with an AL 2 guide catheter.  Verapamil is administered throughout the  PCI procedure.  Additional heparin is administered to achieve a therapeutic ACT.  A cougar wire would not cross the lesion.  A whisper wire successfully navigated the lesion but was difficult to wire into the native posterolateral branch.  Ultimately this was done successfully and the graft was extensively dilated with a 2.0 x 15 mm balloon throughout the mid and distal portion all the way to the distal anastomosis.  Even with extensive balloon dilatation and administration of intracoronary verapamil, flow remained TIMI I with extensive degenerative changes throughout the mid and distal body of the bypass graft.  Due to the patient&#39;s chronic kidney disease, relatively limited infarct involving a posterolateral branch of the circumflex, and very unlikely chance for successful reperfusion with extensive vein graft stenting, I elected to stop the procedure after balloon angioplasty in order to minimize contrast and reduce the patient&#39;s risk of distal embolization with a low likelihood of procedural success. Post-Intervention Lesion Assessment The intervention was unsuccessful. Pre-interventional TIMI flow is 0. Post-intervention TIMI flow is 1. No complications occurred at this lesion. There is a 80% residual stenosis post intervention.   STRESS TESTS  MYOCARDIAL PERFUSION IMAGING 05/31/2022  Narrative   The study is normal. The study is low risk.   No ST deviation was noted.   Left ventricular function is normal. Nuclear stress EF: 61 %. The left ventricular ejection fraction is normal (55-65%). End diastolic cavity size is normal.   Prior study available for comparison from 03/10/2020.    ECHOCARDIOGRAM  ECHOCARDIOGRAM COMPLETE 06/15/2022  Narrative ECHOCARDIOGRAM REPORT    Patient Name:   Garrett Graveshomas J Hauter Sr. Date of Exam: 06/15/2022 Medical Rec #:  [id number]          Height:       69.5 in Accession #:    [id number]         Weight:       203.1 lb Date of Birth:  Jan 15, 1941          BSA:          2.090 m Patient Age:    80 years           BP:           109/72 mmHg Patient Gender: M                  HR:           62 bpm. Exam Location:  Inpatient  Procedure: 2D Echo, Cardiac Doppler and Color Doppler  Indications:    Inferior STEMI  History:        Patient has prior history of Echocardiogram examinations. CHF, CAD, Cardiac cath 06/16/22 and Prior CABG; Arrythmias:Atrial Fibrillation.  Sonographer:    Roosvelt Maserachel Lane RDCS Referring Phys: [phone] MICHAEL COOPER  IMPRESSIONS   1. Left ventricular ejection fraction, by estimation, is 55 to 60%. The left ventricle has normal function. The left ventricle demonstrates global hypokinesis. There is mild concentric left ventricular hypertrophy. Left ventricular diastolic parameters are indeterminate. 2. Right ventricular systolic function is mildly reduced. The right ventricular size is normal. 3. Left atrial size was moderately dilated. 4. Right atrial size was moderately dilated. 5. The mitral valve is normal in structure. No evidence of mitral valve regurgitation. No evidence of mitral stenosis. 6. Tricuspid valve regurgitation is moderate. 7. The aortic valve is normal in structure. Aortic valve regurgitation is not visualized. No aortic stenosis is present. 8. Aneurysm of the ascending aorta, measuring 44 mm. There is mild dilatation  of the aortic root, measuring 39 mm. 9. The inferior vena cava is normal in size with greater than 50% respiratory variability, suggesting right atrial pressure of 3 mmHg.  FINDINGS Left Ventricle: Left ventricular ejection fraction, by estimation, is 55 to 60%. The left ventricle has normal  function. The left ventricle demonstrates global hypokinesis. The left ventricular internal cavity size was normal in size. There is mild concentric left ventricular hypertrophy. Left ventricular diastolic parameters are indeterminate.   LV Wall Scoring: The basal inferolateral segment and basal inferior segment are hypokinetic. The entire anterior wall, antero-lateral wall, mid and distal lateral wall, entire septum, entire apex, and mid and distal inferior wall are normal.  Right Ventricle: The right ventricular size is normal. No increase in right ventricular wall thickness. Right ventricular systolic function is mildly reduced.  Left Atrium: Left atrial size was moderately dilated.  Right Atrium: Right atrial size was moderately dilated.  Pericardium: There is no evidence of pericardial effusion. Presence of epicardial fat layer.  Mitral Valve: The mitral valve is normal in structure. No evidence of mitral valve regurgitation. No evidence of mitral valve stenosis.  Tricuspid Valve: The tricuspid valve is normal in structure. Tricuspid valve regurgitation is moderate . No evidence of tricuspid stenosis.  Aortic Valve: The aortic valve is normal in structure. Aortic valve regurgitation is not visualized. No aortic stenosis is present.  Pulmonic Valve: The pulmonic valve was normal in structure. Pulmonic valve regurgitation is trivial. No evidence of pulmonic stenosis.  Aorta: The aortic root is normal in size and structure. There is mild dilatation of the aortic root, measuring 39 mm. There is an aneurysm involving the ascending aorta measuring 44 mm.  Venous: The inferior vena cava is normal in size with greater than 50% respiratory variability, suggesting right atrial pressure of 3 mmHg.  IAS/Shunts: No atrial level shunt detected by color flow Doppler.   LEFT VENTRICLE PLAX 2D LVIDd:         4.90 cm LVIDs:         3.90 cm LV PW:         1.20 cm LV IVS:        1.10 cm LVOT  diam:     2.00 cm LV SV:         43 LV SV Index:   20 LVOT Area:     3.14 cm   RIGHT VENTRICLE RV Basal diam:  3.80 cm RV S prime:     5.48 cm/s TAPSE (M-mode): 0.9 cm  LEFT ATRIUM              Index        RIGHT ATRIUM           Index LA diam:        4.50 cm  2.15 cm/m   RA Area:     25.90 cm LA Vol (A2C):   112.0 ml 53.59 ml/m  RA Volume:   90.40 ml  43.26 ml/m LA Vol (A4C):   80.0 ml  38.28 ml/m LA Biplane Vol: 98.7 ml  47.23 ml/m AORTIC VALVE LVOT Vmax:   63.90 cm/s LVOT Vmean:  40.900 cm/s LVOT VTI:    0.136 m  AORTA Ao Root diam: 3.90 cm Ao Asc diam:  4.40 cm  TRICUSPID VALVE TR Peak grad:   29.8 mmHg TR Vmax:        273.00 cm/s  SHUNTS Systemic VTI:  0.14 m Systemic Diam: 2.00 cm  Thomasene Ripple DO Electronically signed  by Thomasene Ripple DO Signature Date/Time: 06/15/2022/1:26:59 PM    Final    MONITORS  LONG TERM MONITOR (3-14 DAYS) 01/18/2020  Narrative  Sinus pauses x 3, longest is 3.1 to 3.8 second during day time hours.  Sinus rhythm, occasional PVC.  Rare atrial tachycardia.  Occasional baseline artifact but no clear evidence of atrial fibrialltion  This monitor is similar to prior.  Will discontinue metoprolol given pauses. I would like for him to have an EP consult given his underlying conduction disease and potential need for pacemaker. Donato Schultz, MD            Recent Labs: 06/17/2022: Magnesium 1.9 08/11/2022: ALT 27 10/31/2022: BUN 16; Creatinine, Ser 1.65; Hemoglobin 14.8; Platelets 144; Potassium 4.1; Sodium 137  Recent Lipid Panel    Component Value Date/Time   CHOL 99 (L) 08/11/2022 1057   TRIG 192 (H) 08/11/2022 1057   HDL 32 (L) 08/11/2022 1057   CHOLHDL 3.1 08/11/2022 1057   CHOLHDL 5.0 06/15/2022 0755   VLDL 74 (H) 06/15/2022 0755   LDLCALC 36 08/11/2022 1057   LDLDIRECT 67 07/15/2021 0912     Risk Assessment/Calculations:   {Does this patient have ATRIAL FIBRILLATION?:[phone]}       Physical Exam:     VS:  There were no vitals taken for this visit.    Wt Readings from Last 3 Encounters:  12/06/22 187 lb (84.8 kg)  11/10/22 187 lb (84.8 kg)  10/31/22 197 lb 12.8 oz (89.7 kg)     GEN: *** Well nourished, well developed in no acute distress HEENT: Normal NECK: No JVD; No carotid bruits CARDIAC: ***RRR, no murmurs, rubs, gallops RESPIRATORY:  Clear to auscultation without rales, wheezing or rhonchi  ABDOMEN: Soft, non-tender, non-distended MUSCULOSKELETAL:  No edema; No deformity. *** pedal pulses, ***bilaterally SKIN: Warm and dry NEUROLOGIC:  Alert and oriented x 3 PSYCHIATRIC:  Normal affect   EKG:  EKG is *** ordered today.  The ekg ordered today demonstrates ***  No BP recorded.  {Refresh Note OR Click here to enter BP  :1}***    Diagnoses:    No diagnosis found. Assessment and Plan:     CAD without angina: Hypertension: Hyperlipidemia LDL goal < 55: LDL 36, triglycerides 192 on 08/11/2022.  Continue Vascepa, atorvastatin, ezetimibe, fenofibrate  {Are you ordering a CV Procedure (e.g. stress test, cath, DCCV, TEE, etc)?   Press F2        :[id number]}   Disposition:  Medication Adjustments/Labs and Tests Ordered: Current medicines are reviewed at length with the patient today.  Concerns regarding medicines are outlined above.  No orders of the defined types were placed in this encounter.  No orders of the defined types were placed in this encounter.   There are no Patient Instructions on file for this visit.   Signed, Levi Aland, NP  02/20/2023 6:10 PM    [facility]

## 2023-02-21 ENCOUNTER — Ambulatory Visit: Payer: Medicare Other | Attending: Cardiology | Admitting: Nurse Practitioner

## 2023-02-21 ENCOUNTER — Encounter: Payer: Self-pay | Admitting: Nurse Practitioner

## 2023-02-21 VITALS — BP 132/74 | HR 70 | Ht 69.5 in | Wt 191.8 lb

## 2023-02-21 DIAGNOSIS — E785 Hyperlipidemia, unspecified: Secondary | ICD-10-CM | POA: Diagnosis not present

## 2023-02-21 DIAGNOSIS — I25118 Atherosclerotic heart disease of native coronary artery with other forms of angina pectoris: Secondary | ICD-10-CM

## 2023-02-21 DIAGNOSIS — I480 Paroxysmal atrial fibrillation: Secondary | ICD-10-CM | POA: Diagnosis not present

## 2023-02-21 DIAGNOSIS — D6869 Other thrombophilia: Secondary | ICD-10-CM

## 2023-02-21 DIAGNOSIS — Z951 Presence of aortocoronary bypass graft: Secondary | ICD-10-CM

## 2023-02-21 MED ORDER — CARVEDILOL 6.25 MG PO TABS: 6.2500 mg | ORAL_TABLET | Freq: Two times a day (BID) | ORAL | 3 refills | Status: DC

## 2023-02-21 NOTE — Patient Instructions (Signed)
Medication Instructions:   DISCONTINUE Lopressor.  START Coreg one (1) tablet by mouth (6.25 mg) twice daily.  *If you need a refill on your cardiac medications before your next appointment, please call your pharmacy*   Lab Work:  None ordered.  If you have labs (blood work) drawn today and your tests are completely normal, you will receive your results only by: MyChart Message (if you have MyChart) OR A paper copy in the mail If you have any lab test that is abnormal or we need to change your treatment, we will call you to review the results.   Testing/Procedures:  None ordered   Follow-Up: At [facility], you and your health needs are our priority.  As part of our continuing mission to provide you with exceptional heart care, we have created designated Provider Care Teams.  These Care Teams include your primary Cardiologist (physician) and Advanced Practice Providers (APPs -  Physician Assistants and Nurse Practitioners) who all work together to provide you with the care you need, when you need it.  We recommend signing up for the patient portal called "MyChart".  Sign up information is provided on this After Visit Summary.  MyChart is used to connect with patients for Virtual Visits (Telemedicine).  Patients are able to view lab/test results, encounter notes, upcoming appointments, etc.  Non-urgent messages can be sent to your provider as well.   To learn more about what you can do with MyChart, go to [email or web address].    Your next appointment:   6 month(s)  Provider:   Donato Schultz, MD

## 2023-02-27 MED ORDER — APIXABAN 5 MG PO TABS: 5.0000 mg | ORAL_TABLET | Freq: Two times a day (BID) | ORAL | 1 refills | Status: DC

## 2023-03-02 DIAGNOSIS — E1122 Type 2 diabetes mellitus with diabetic chronic kidney disease: Secondary | ICD-10-CM | POA: Diagnosis not present

## 2023-03-02 DIAGNOSIS — N184 Chronic kidney disease, stage 4 (severe): Secondary | ICD-10-CM | POA: Diagnosis not present

## 2023-03-02 DIAGNOSIS — E538 Deficiency of other specified B group vitamins: Secondary | ICD-10-CM | POA: Diagnosis not present

## 2023-03-02 DIAGNOSIS — E559 Vitamin D deficiency, unspecified: Secondary | ICD-10-CM | POA: Diagnosis not present

## 2023-03-02 DIAGNOSIS — I10 Essential (primary) hypertension: Secondary | ICD-10-CM | POA: Diagnosis not present

## 2023-03-02 DIAGNOSIS — G2581 Restless legs syndrome: Secondary | ICD-10-CM | POA: Diagnosis not present

## 2023-03-02 DIAGNOSIS — E039 Hypothyroidism, unspecified: Secondary | ICD-10-CM | POA: Diagnosis not present

## 2023-03-02 DIAGNOSIS — Z23 Encounter for immunization: Secondary | ICD-10-CM | POA: Diagnosis not present

## 2023-03-02 DIAGNOSIS — Z0000 Encounter for general adult medical examination without abnormal findings: Secondary | ICD-10-CM | POA: Diagnosis not present

## 2023-03-02 DIAGNOSIS — I4891 Unspecified atrial fibrillation: Secondary | ICD-10-CM | POA: Diagnosis not present

## 2023-03-02 DIAGNOSIS — E782 Mixed hyperlipidemia: Secondary | ICD-10-CM | POA: Diagnosis not present

## 2023-03-02 DIAGNOSIS — M109 Gout, unspecified: Secondary | ICD-10-CM | POA: Diagnosis not present

## 2023-03-02 LAB — COMPREHENSIVE METABOLIC PANEL: EGFR: 43

## 2023-03-02 LAB — HEMOGLOBIN A1C: A1c: 6.5

## 2023-03-05 ENCOUNTER — Encounter: Payer: Self-pay | Admitting: Pharmacist

## 2023-03-05 ENCOUNTER — Encounter: Payer: Self-pay | Admitting: Internal Medicine

## 2023-03-05 MED ORDER — OLMESARTAN MEDOXOMIL 40 MG PO TABS: 40.0000 mg | ORAL_TABLET | Freq: Every day | ORAL | 3 refills | Status: DC

## 2023-03-22 ENCOUNTER — Encounter: Payer: Self-pay | Admitting: Internal Medicine

## 2023-03-22 ENCOUNTER — Ambulatory Visit: Payer: Medicare Other | Admitting: Internal Medicine

## 2023-03-22 VITALS — BP 130/80 | HR 75 | Ht 69.5 in | Wt 194.0 lb

## 2023-03-22 DIAGNOSIS — Z7984 Long term (current) use of oral hypoglycemic drugs: Secondary | ICD-10-CM

## 2023-03-22 DIAGNOSIS — N1832 Chronic kidney disease, stage 3b: Secondary | ICD-10-CM | POA: Diagnosis not present

## 2023-03-22 DIAGNOSIS — E1159 Type 2 diabetes mellitus with other circulatory complications: Secondary | ICD-10-CM | POA: Diagnosis not present

## 2023-03-22 DIAGNOSIS — E1122 Type 2 diabetes mellitus with diabetic chronic kidney disease: Secondary | ICD-10-CM

## 2023-03-22 MED ORDER — GLIPIZIDE 5 MG PO TABS: 2.5000 mg | ORAL_TABLET | Freq: Two times a day (BID) | ORAL | 3 refills | Status: DC

## 2023-03-22 MED ORDER — DAPAGLIFLOZIN PROPANEDIOL 10 MG PO TABS: 10.0000 mg | ORAL_TABLET | Freq: Every day | ORAL | 3 refills | Status: DC

## 2023-03-22 NOTE — Progress Notes (Signed)
Name: Garrett Jordan  Age/ Sex: 82 y.o., male   MRN/ DOB: [id number], 16-Jan-1941     PCP: [name], MD   Reason for Endocrinology Evaluation: Type 2 Diabetes Mellitus     Initial Endocrinology Clinic Visit:  03/17/2020    PATIENT IDENTIFIER: Mr. Garrett Hon. is a 82 y.o. male with a past medical history of T2DM, RLS, CAD and CKD. The patient has followed with Endocrinology clinic since 03/17/2020 for consultative assistance with management of his diabetes.  DIABETIC HISTORY:  Garrett Jordan was diagnosed with T2 DM in 2019. Invokana- cost prohibitive . His hemoglobin A1c has ranged from 6.7% in 2020, peaking at 7.8 % in 2021    On his initial visit to our clinic his A1c was 6.5 % , he was on Januvia which was continued, Invokana was cost prohibitive and we switched it to Glipizide.   Januvia stopped 06/2020 due to high cost for pt  Jardiance started by nephrology by 9/2021but this was temporarily held by cardiology due to elevated Cr but restarted 09/2022    HYPERCALCEMIA HISTORY: Garrett Jordan indicates that he was first noted with hypercalcemia in 2020. He has hx of kidney stones  He is S/P Left parathyroidectomy in 1970's  Ca/Cr ratio 0.0210  SUBJECTIVE:    During the last visit (09/22/2023): A1c 6.6%   Today (03/22/2023): Garrett Jordan is here for a follow up on diabetes management.  He checks his blood sugars 2 times daily . The patient has not had hypoglycemic episodes since the last clinic visit.  He had a follow-up with cardiology for CAD and PAF as well as dyslipidemia 02/21/2023  Denies nausea or vomiting  NO recent change in bowel movement  Denies urine infections    HOME DIABETES REGIMEN:  Glipizide 5 mg, half a tablet before Breakfast and 1 tab before Supper  Farxiga 5 mg daily     Statin: yes ACE-I/ARB: yes     METER DOWNLOAD SUMMARY:Unable to download   93- 203 mg/dL     DIABETIC COMPLICATIONS: Microvascular complications:  CKD  III Denies: retinopathy, neuropathy  Last Eye Exam: Completed 01/30/2023  Macrovascular complications:  CAD (s/p CABG 2010), NSTEMI 06/2022 Denies: CVA, PVD       PHYSICAL EXAM: VS: BP 130/80 (BP Location: Left Arm, Patient Position: Sitting, Cuff Size: Small)   Pulse 75   Ht 5' 9.5" (1.765 m)   Wt 194 lb (88 kg)   SpO2 95%   BMI 28.24 kg/m    EXAM: General: Pt appears well and is in NAD  Lungs: Clear with good BS bilat with no rales, rhonchi, or wheezes  Heart: Auscultation: RRR  Abdomen: Normoactive bowel sounds, soft, nontender, without masses or organomegaly palpable  Extremities:  BL UY:QIHKV edema on the left with scab formation due to an injury but no evidence of infection  Mental Status: Judgment, insight: intact Orientation: oriented to time, place, and person Mood and affect: no depression, anxiety, or agitation    DM Foot Exam 03/31/2021  The skin of the feet is intact without sores or ulcerations. The pedal pulses are 1+ on right and 1+ on left. The sensation is intact to a screening 5.07, 10 gram monofilament bilaterally    HISTORY:  Past Medical History:  Past Medical History:  Diagnosis Date   AAA (abdominal aortic aneurysm) (HCC)    Arthritis    knees   Atrial fibrillation (HCC)    CAD (coronary artery disease)  Cancer [facility])    prostate   CKD (chronic kidney disease), stage III (HCC) 06/18/2022   COVID    06/2019 and in 2022 states they were mild cases   Diabetes mellitus without complication (HCC)    Dyspnea    once in a while   Dysrhythmia    A-fib pt is on Eliquis   GERD (gastroesophageal reflux disease)    Gout    History of kidney stones    Hyperlipidemia    Hypertension    Hypothyroidism    Myocardial infarction (HCC) 01/1991   sp inferior   Nerve compression    right leg   Reflux    Seasonal allergies    Thoracic ascending aortic aneurysm (HCC)    Thyroid disease    hypothyroidism   Past Surgical History:  Past  Surgical History:  Procedure Laterality Date   ABDOMINAL AORTAGRAM N/A 02/28/2012   Procedure: ABDOMINAL Ronny Flurry;  Surgeon: Nada Libman, MD;  Location: [facility] CATH LAB;  Service: Cardiovascular;  Laterality: N/A;   abdominal aortagram embolization  02/28/2012   ABDOMINAL AORTIC ANEURYSM REPAIR  07/14/2012   CARDIAC CATHETERIZATION     CATARACT EXTRACTION Bilateral 2016   CORONARY ARTERY BYPASS GRAFT  04/13/2009   CORONARY BALLOON ANGIOPLASTY N/A 06/15/2022   Procedure: CORONARY BALLOON ANGIOPLASTY;  Surgeon: Tonny Bollman, MD;  Location: [facility] INVASIVE CV LAB;  Service: Cardiovascular;  Laterality: N/A;   CORONARY STENT PLACEMENT  1992 and  2009   RCA   EMBOLIZATION Right 02/28/2012   Procedure: EMBOLIZATION;  Surgeon: Nada Libman, MD;  Location[facility] C[facility] LAB;  Service: Cardiovascular;  Laterality: Right;   IR GENERIC HISTORICAL  12/05/2016   IR RADIOLOGIST EVAL & MGMT 12/05/2016 Malachy Moan, MD [facility] INTERV RAD   IR RADIOLOGIST EVAL & MGMT  07/10/2019   IR RADIOLOGIST EVAL & MGMT  08/25/2020   IR RADIOLOGIST EVAL & MGMT  08/16/2021   IR RADIOLOGIST EVAL & MGMT  08/31/2022   KIDNEY STONE SURGERY     LAMINECTOMY WITH POSTERIOR LATERAL ARTHRODESIS LEVEL 1 Bilateral 11/10/2022   Procedure: Lumbar Two-Lumbar Three laminectomy  - bilateral with posterior lateral fusion and interspinous plating;  Surgeon: Tia Alert, MD;  Location: [facility] OR;  Service: Neurosurgery;  Laterality: Bilateral;   LEFT HEART CATH AND CORONARY ANGIOGRAPHY N/A 06/15/2022   Procedure: LEFT HEART CATH AND CORONARY ANGIOGRAPHY;  Surgeon: Tonny Bollman, MD;  Location: [facility] INVASIVE CV LAB;  Service: Cardiovascular;  Laterality: N/A;   LEFT HEART CATHETERIZATION WITH CORONARY/GRAFT ANGIOGRAM N/A 01/08/2015   Procedure: LEFT HEART CATHETERIZATION WITH Isabel Caprice;  Surgeon: Othella Boyer, MD;  Location: [facility] CATH LAB;  Service: Cardiovascular;  Laterality: N/A;   parathyroid adenoma  1981   surgery    PROSTATE SURGERY     rad seeds   SPINE SURGERY  2006   TONSILLECTOMY  1955   Social History:  reports that he quit smoking about 52 years ago. His smoking use included cigarettes. He has never used smokeless tobacco. He reports current alcohol use of about 6.0 standard drinks of alcohol per week. He reports that he does not use drugs. Family History:  Family History  Problem Relation Age of Onset   Heart disease Father        Heart Disease before age 93   Heart attack Father    Hyperlipidemia Father    Hypertension Father    Stroke Mother    Deep vein thrombosis Mother    Heart disease Sister  Diabetes Sister    Hyperlipidemia Sister    Hypertension Sister    Heart attack Sister    Heart disease Paternal Uncle    Diabetes Maternal Grandmother    Diabetes Sister      HOME MEDICATIONS: Allergies as of 03/22/2023       Reactions   Ace Inhibitors Cough   Crestor [rosuvastatin] Other (See Comments)   Muscle aches   Nitroglycerin Other (See Comments)   Very pronounced lowered BP with IV nitro for caths   Quinolones Other (See Comments)   Aortic root enlargement        Medication List        Accurate as of Mar 22, 2023  7:58 AM. If you have any questions, ask your nurse or doctor.          Accu-Chek Guide test strip Generic drug: glucose blood USE TWICE DAILY   acetaminophen 650 MG CR tablet Commonly known as: TYLENOL Take 650 mg by mouth every 8 (eight) hours as needed for pain.   allopurinol 300 MG tablet Commonly known as: ZYLOPRIM Take 300 mg by mouth every other day. on even days of the month   amLODipine 10 MG tablet Commonly known as: NORVASC Take 1 tablet (10 mg total) by mouth daily.   apixaban 5 MG Tabs tablet Commonly known as: Eliquis Take 1 tablet (5 mg total) by mouth 2 (two) times daily.   Aspercreme Lidocaine 4 % Generic drug: lidocaine Place 1 patch onto the skin daily as needed (pain).   atorvastatin 40 MG tablet Commonly known  as: LIPITOR Take 1 tablet (40 mg total) by mouth daily.   carvedilol 6.25 MG tablet Commonly known as: COREG Take 1 tablet (6.25 mg total) by mouth 2 (two) times daily.   clonazePAM 1 MG tablet Commonly known as: KLONOPIN Take 0.5 mg by mouth at bedtime.   clopidogrel 75 MG tablet Commonly known as: PLAVIX Take 1 tablet (75 mg total) by mouth daily.   Coenzyme Q10 300 MG Caps Take 300 mg by mouth daily.   dapagliflozin propanediol 10 MG Tabs tablet Commonly known as: Farxiga Take 1 tablet (10 mg total) by mouth daily before breakfast. What changed:  medication strength how much to take Changed by: Scarlette Shorts, MD   ezetimibe 10 MG tablet Commonly known as: ZETIA TAKE 1 TABLET BY MOUTH  DAILY   fenofibrate micronized 200 MG capsule Commonly known as: LOFIBRA TAKE 1 CAPSULE BY MOUTH DAILY  BEFORE BREAKFAST   fluorouracil 5 % cream Commonly known as: EFUDEX Apply 1 Application topically daily as needed (cancer spots). Use as directed   fluticasone 50 MCG/ACT nasal spray Commonly known as: FLONASE Place 2 sprays into both nostrils daily as needed for allergies.   glipiZIDE 5 MG tablet Commonly known as: GLUCOTROL Take 0.5 tablets (2.5 mg total) by mouth 2 (two) times daily before a meal. What changed: See the new instructions. Changed by: Scarlette Shorts, MD   isosorbide mononitrate 30 MG 24 hr tablet Commonly known as: IMDUR Take 1 tablet (30 mg total) by mouth daily.   levothyroxine 175 MCG tablet Commonly known as: SYNTHROID Take 175 mcg by mouth daily before breakfast.   loratadine 10 MG tablet Commonly known as: CLARITIN Take 10 mg by mouth at bedtime.   magnesium oxide 400 (240 Mg) MG tablet Commonly known as: MAG-OX Take 400 mg by mouth daily.   nitroGLYCERIN 0.4 MG SL tablet Commonly known as: Nitrostat Place 1 tablet (0.4  mg total) under the tongue every 5 (five) minutes as needed for chest pain.   olmesartan 40 MG  tablet Commonly known as: BENICAR Take 1 tablet (40 mg total) by mouth daily.   Omron 3 Series BP Monitor Devi 1 each by Does not apply route daily.   Oxycodone HCl 10 MG Tabs Take 1 tablet (10 mg total) by mouth every 4 (four) hours as needed for severe pain ((score 7 to 10)).   pantoprazole 40 MG tablet Commonly known as: PROTONIX TAKE 1 TABLET BY MOUTH DAILY   PreserVision AREDS 2 Caps Take 1 capsule by mouth 2 (two) times daily.   SYSTANE OP Place 1 drop into both eyes daily as needed (dry/irritated eyes).   tamsulosin 0.4 MG Caps capsule Commonly known as: FLOMAX Take 0.4 mg by mouth every other day.   traMADol 50 MG tablet Commonly known as: ULTRAM Take 50 mg by mouth every 12 (twelve) hours as needed for moderate pain.   triamcinolone cream 0.1 % Commonly known as: KENALOG Apply 1 application  topically daily as needed (rash).   TURMERIC PO Take 2 tablets by mouth in the morning and at bedtime.   Vascepa 1 g capsule Generic drug: icosapent Ethyl TAKE 2 CAPSULES BY MOUTH 2 TIMES DAILY.   VITAMIN B-12 PO Take 2,500 mcg by mouth daily.   Vitamin C 500 MG Caps daily at 6 (six) AM.   Vitamin D3 50 MCG (2000 UT) Tabs Take 2,000 Units by mouth daily.        DM foot exam: 03/22/2023   The skin of the feet is intact without sores or ulcerations. The pedal pulses are 2+ on right and 2+ on left. The sensation is intact to a screening 5.07, 10 gram monofilament bilaterally    DATA REVIEWED:  Lab Results  Component Value Date   HGBA1C 6.6 (A) 09/21/2022   HGBA1C 6.7 (H) 06/15/2022   HGBA1C 6.7 (H) 06/13/2022   03/02/2023 A1C 6.5% HDL 36 LDL 53 Tg 118 BUN 25 Cr 1.6 GFR 43   ASSESSMENT / PLAN / RECOMMENDATIONS:   1) Type 2 Diabetes Mellitus, Optimally controlled, With CKD III and macrovascular complications - Most recent A1c of 6.6 %. Goal A1c < 7.0%.   - His A1c remains at goal - He did not qualify for patient assistance program for Farxiga  -  Will increase Farxiga and decrease Glipizide as below  - Labs from PCP 's office reviewed  - He did not need refills on meds today  to include slight improvement in GFR from 40 to 43 over the past year   MEDICATIONS:  Decrease glipizide 5 mg, half a tablet before Breakfast and before supper  Increase Farxiga 10 mg daily    EDUCATION / INSTRUCTIONS: BG monitoring instructions: Patient is instructed to check his blood sugars 2 times a day, before breakfast and supper . Call [facility] if: BG persistently < 70  I reviewed the Rule of 15 for the treatment of hypoglycemia in detail with the patient. Literature supplied.     2) Diabetic complications:  Eye: Does not have known diabetic retinopathy.  Neuro/ Feet: Does not have known diabetic peripheral neuropathy .  Renal: Patient does  have known baseline CKD. He   is on an ACEI/ARB at present.     3) Dyslipidemia/CAD:  -Per cardiology - LDL at goal    F/U in 6 months    Signed electronically by: Lyndle Herrlich, MD  [facility] Endocr[facility]  [facility] [address] [place], [place] [address] Phone: [phone] FAX: [phone]   CC: [name], MD [address] [place] [address] Phone: [phone]  Fax: [phone]  Return to Endocrinology clinic as below: Future Appointments  Date Time Provider Department Center  08/10/2023  9:20 AM Jake Bathe, MD [facility]  09/25/2023  7:30 AM Dhani Imel, Konrad Dolores, MD [facility] None

## 2023-03-22 NOTE — Patient Instructions (Signed)
-   Change Glipizide 5 mg , HALF a tablet before Breakfast and before Supper  - Increase Farxiga 10 mg daily       HOW TO TREAT LOW BLOOD SUGARS (Blood sugar LESS THAN 70 MG/DL) Please follow the RULE OF 15 for the treatment of hypoglycemia treatment (when your (blood sugars are less than 70 mg/dL)   STEP 1: Take 15 grams of carbohydrates when your blood sugar is low, which includes:  3-4 GLUCOSE TABS  OR 3-4 OZ OF JUICE OR REGULAR SODA OR ONE TUBE OF GLUCOSE GEL    STEP 2: RECHECK blood sugar in 15 MINUTES STEP 3: If your blood sugar is still low at the 15 minute recheck --> then, go back to STEP 1 and treat AGAIN with another 15 grams of carbohydrates.

## 2023-03-30 DIAGNOSIS — C44629 Squamous cell carcinoma of skin of left upper limb, including shoulder: Secondary | ICD-10-CM | POA: Diagnosis not present

## 2023-03-30 DIAGNOSIS — L570 Actinic keratosis: Secondary | ICD-10-CM | POA: Diagnosis not present

## 2023-03-30 DIAGNOSIS — D1801 Hemangioma of skin and subcutaneous tissue: Secondary | ICD-10-CM | POA: Diagnosis not present

## 2023-03-30 DIAGNOSIS — D2271 Melanocytic nevi of right lower limb, including hip: Secondary | ICD-10-CM | POA: Diagnosis not present

## 2023-03-30 DIAGNOSIS — L814 Other melanin hyperpigmentation: Secondary | ICD-10-CM | POA: Diagnosis not present

## 2023-03-30 DIAGNOSIS — D225 Melanocytic nevi of trunk: Secondary | ICD-10-CM | POA: Diagnosis not present

## 2023-03-30 DIAGNOSIS — L821 Other seborrheic keratosis: Secondary | ICD-10-CM | POA: Diagnosis not present

## 2023-03-30 DIAGNOSIS — Z85828 Personal history of other malignant neoplasm of skin: Secondary | ICD-10-CM | POA: Diagnosis not present

## 2023-03-30 DIAGNOSIS — D2272 Melanocytic nevi of left lower limb, including hip: Secondary | ICD-10-CM | POA: Diagnosis not present

## 2023-04-06 ENCOUNTER — Other Ambulatory Visit: Payer: Self-pay | Admitting: Cardiology

## 2023-04-16 ENCOUNTER — Other Ambulatory Visit: Payer: Self-pay | Admitting: Internal Medicine

## 2023-04-23 DIAGNOSIS — N3001 Acute cystitis with hematuria: Secondary | ICD-10-CM | POA: Diagnosis not present

## 2023-04-23 DIAGNOSIS — R300 Dysuria: Secondary | ICD-10-CM | POA: Diagnosis not present

## 2023-04-23 DIAGNOSIS — R109 Unspecified abdominal pain: Secondary | ICD-10-CM | POA: Diagnosis not present

## 2023-04-24 ENCOUNTER — Emergency Department (HOSPITAL_COMMUNITY): Payer: Medicare Other

## 2023-04-24 ENCOUNTER — Other Ambulatory Visit: Payer: Self-pay

## 2023-04-24 ENCOUNTER — Encounter (HOSPITAL_COMMUNITY)
Admission: EM | Disposition: A | Discharge status: Home / Self Care | Payer: Self-pay | Source: Home / Self Care | Attending: Emergency Medicine

## 2023-04-24 ENCOUNTER — Encounter (HOSPITAL_COMMUNITY): Payer: Self-pay

## 2023-04-24 ENCOUNTER — Observation Stay (HOSPITAL_COMMUNITY)
Admission: EM | Admit: 2023-04-24 | Discharge: 2023-04-25 | Disposition: A | Discharge status: Home / Self Care | Payer: Medicare Other | Attending: Internal Medicine | Admitting: Internal Medicine

## 2023-04-24 DIAGNOSIS — E785 Hyperlipidemia, unspecified: Secondary | ICD-10-CM | POA: Diagnosis not present

## 2023-04-24 DIAGNOSIS — I25810 Atherosclerosis of coronary artery bypass graft(s) without angina pectoris: Secondary | ICD-10-CM | POA: Diagnosis not present

## 2023-04-24 DIAGNOSIS — Z95828 Presence of other vascular implants and grafts: Secondary | ICD-10-CM | POA: Diagnosis not present

## 2023-04-24 DIAGNOSIS — M170 Bilateral primary osteoarthritis of knee: Secondary | ICD-10-CM | POA: Diagnosis not present

## 2023-04-24 DIAGNOSIS — E876 Hypokalemia: Secondary | ICD-10-CM | POA: Diagnosis not present

## 2023-04-24 DIAGNOSIS — N139 Obstructive and reflux uropathy, unspecified: Secondary | ICD-10-CM | POA: Diagnosis not present

## 2023-04-24 DIAGNOSIS — Z951 Presence of aortocoronary bypass graft: Secondary | ICD-10-CM

## 2023-04-24 DIAGNOSIS — N281 Cyst of kidney, acquired: Secondary | ICD-10-CM | POA: Diagnosis not present

## 2023-04-24 DIAGNOSIS — I4510 Unspecified right bundle-branch block: Secondary | ICD-10-CM | POA: Diagnosis not present

## 2023-04-24 DIAGNOSIS — Z7984 Long term (current) use of oral hypoglycemic drugs: Secondary | ICD-10-CM | POA: Insufficient documentation

## 2023-04-24 DIAGNOSIS — R310 Gross hematuria: Secondary | ICD-10-CM | POA: Diagnosis not present

## 2023-04-24 DIAGNOSIS — Z7902 Long term (current) use of antithrombotics/antiplatelets: Secondary | ICD-10-CM | POA: Insufficient documentation

## 2023-04-24 DIAGNOSIS — N12 Tubulo-interstitial nephritis, not specified as acute or chronic: Secondary | ICD-10-CM | POA: Diagnosis present

## 2023-04-24 DIAGNOSIS — I10 Essential (primary) hypertension: Secondary | ICD-10-CM | POA: Diagnosis not present

## 2023-04-24 DIAGNOSIS — Z8546 Personal history of malignant neoplasm of prostate: Secondary | ICD-10-CM | POA: Insufficient documentation

## 2023-04-24 DIAGNOSIS — I129 Hypertensive chronic kidney disease with stage 1 through stage 4 chronic kidney disease, or unspecified chronic kidney disease: Secondary | ICD-10-CM | POA: Diagnosis not present

## 2023-04-24 DIAGNOSIS — N1832 Chronic kidney disease, stage 3b: Secondary | ICD-10-CM | POA: Diagnosis not present

## 2023-04-24 DIAGNOSIS — Z87891 Personal history of nicotine dependence: Secondary | ICD-10-CM | POA: Diagnosis not present

## 2023-04-24 DIAGNOSIS — Z955 Presence of coronary angioplasty implant and graft: Secondary | ICD-10-CM | POA: Insufficient documentation

## 2023-04-24 DIAGNOSIS — E1122 Type 2 diabetes mellitus with diabetic chronic kidney disease: Secondary | ICD-10-CM | POA: Diagnosis not present

## 2023-04-24 DIAGNOSIS — N200 Calculus of kidney: Secondary | ICD-10-CM | POA: Diagnosis not present

## 2023-04-24 DIAGNOSIS — N1339 Other hydronephrosis: Secondary | ICD-10-CM | POA: Diagnosis not present

## 2023-04-24 DIAGNOSIS — Z7901 Long term (current) use of anticoagulants: Secondary | ICD-10-CM | POA: Diagnosis not present

## 2023-04-24 DIAGNOSIS — I480 Paroxysmal atrial fibrillation: Secondary | ICD-10-CM | POA: Diagnosis present

## 2023-04-24 DIAGNOSIS — K8020 Calculus of gallbladder without cholecystitis without obstruction: Secondary | ICD-10-CM | POA: Diagnosis not present

## 2023-04-24 DIAGNOSIS — N132 Hydronephrosis with renal and ureteral calculous obstruction: Principal | ICD-10-CM | POA: Diagnosis present

## 2023-04-24 DIAGNOSIS — N390 Urinary tract infection, site not specified: Secondary | ICD-10-CM | POA: Diagnosis not present

## 2023-04-24 DIAGNOSIS — T45515A Adverse effect of anticoagulants, initial encounter: Secondary | ICD-10-CM | POA: Diagnosis not present

## 2023-04-24 DIAGNOSIS — E871 Hypo-osmolality and hyponatremia: Secondary | ICD-10-CM | POA: Diagnosis not present

## 2023-04-24 DIAGNOSIS — K760 Fatty (change of) liver, not elsewhere classified: Secondary | ICD-10-CM | POA: Diagnosis not present

## 2023-04-24 DIAGNOSIS — R0602 Shortness of breath: Secondary | ICD-10-CM | POA: Diagnosis not present

## 2023-04-24 DIAGNOSIS — Z8616 Personal history of COVID-19: Secondary | ICD-10-CM | POA: Insufficient documentation

## 2023-04-24 DIAGNOSIS — N179 Acute kidney failure, unspecified: Secondary | ICD-10-CM | POA: Diagnosis not present

## 2023-04-24 DIAGNOSIS — N10 Acute tubulo-interstitial nephritis: Secondary | ICD-10-CM | POA: Insufficient documentation

## 2023-04-24 DIAGNOSIS — N201 Calculus of ureter: Secondary | ICD-10-CM | POA: Diagnosis not present

## 2023-04-24 DIAGNOSIS — I714 Abdominal aortic aneurysm, without rupture, unspecified: Secondary | ICD-10-CM | POA: Diagnosis present

## 2023-04-24 DIAGNOSIS — R197 Diarrhea, unspecified: Secondary | ICD-10-CM | POA: Diagnosis not present

## 2023-04-24 DIAGNOSIS — N136 Pyonephrosis: Secondary | ICD-10-CM | POA: Diagnosis not present

## 2023-04-24 DIAGNOSIS — I2510 Atherosclerotic heart disease of native coronary artery without angina pectoris: Secondary | ICD-10-CM | POA: Insufficient documentation

## 2023-04-24 DIAGNOSIS — I700 Atherosclerosis of aorta: Secondary | ICD-10-CM | POA: Diagnosis not present

## 2023-04-24 DIAGNOSIS — Z1152 Encounter for screening for COVID-19: Secondary | ICD-10-CM | POA: Insufficient documentation

## 2023-04-24 DIAGNOSIS — E039 Hypothyroidism, unspecified: Secondary | ICD-10-CM | POA: Diagnosis present

## 2023-04-24 DIAGNOSIS — I130 Hypertensive heart and chronic kidney disease with heart failure and stage 1 through stage 4 chronic kidney disease, or unspecified chronic kidney disease: Secondary | ICD-10-CM | POA: Diagnosis not present

## 2023-04-24 DIAGNOSIS — R109 Unspecified abdominal pain: Secondary | ICD-10-CM | POA: Diagnosis present

## 2023-04-24 DIAGNOSIS — D72819 Decreased white blood cell count, unspecified: Secondary | ICD-10-CM | POA: Diagnosis not present

## 2023-04-24 DIAGNOSIS — Z79899 Other long term (current) drug therapy: Secondary | ICD-10-CM | POA: Diagnosis not present

## 2023-04-24 DIAGNOSIS — N400 Enlarged prostate without lower urinary tract symptoms: Secondary | ICD-10-CM | POA: Diagnosis present

## 2023-04-24 DIAGNOSIS — D696 Thrombocytopenia, unspecified: Secondary | ICD-10-CM | POA: Diagnosis not present

## 2023-04-24 DIAGNOSIS — E1141 Type 2 diabetes mellitus with diabetic mononeuropathy: Secondary | ICD-10-CM | POA: Diagnosis not present

## 2023-04-24 DIAGNOSIS — E1165 Type 2 diabetes mellitus with hyperglycemia: Secondary | ICD-10-CM | POA: Diagnosis not present

## 2023-04-24 DIAGNOSIS — E8721 Acute metabolic acidosis: Secondary | ICD-10-CM | POA: Diagnosis not present

## 2023-04-24 DIAGNOSIS — N209 Urinary calculus, unspecified: Principal | ICD-10-CM

## 2023-04-24 DIAGNOSIS — R339 Retention of urine, unspecified: Secondary | ICD-10-CM | POA: Diagnosis not present

## 2023-04-24 DIAGNOSIS — A419 Sepsis, unspecified organism: Secondary | ICD-10-CM | POA: Diagnosis present

## 2023-04-24 DIAGNOSIS — D6832 Hemorrhagic disorder due to extrinsic circulating anticoagulants: Secondary | ICD-10-CM | POA: Diagnosis not present

## 2023-04-24 HISTORY — PX: CYSTOSCOPY W/ URETERAL STENT PLACEMENT: SHX1429

## 2023-04-24 LAB — URINALYSIS, W/ REFLEX TO CULTURE (INFECTION SUSPECTED)
Bilirubin Urine: NEGATIVE
Glucose, UA: >=500 mg/dL — AB
Ketones, ur: NEGATIVE mg/dL
Nitrite: NEGATIVE
Protein, ur: NEGATIVE mg/dL
Specific Gravity, Urine: 1.014 (ref 1.005–1.030)
WBC, UA: >50 WBC/hpf (ref 0–5)
pH: 5 (ref 5.0–8.0)

## 2023-04-24 LAB — CBC WITH DIFFERENTIAL/PLATELET
Abs Immature Granulocytes: 0.01 10*3/uL (ref 0.00–0.07)
Basophils Absolute: 0 10*3/uL (ref 0.0–0.1)
Basophils Relative: 0 %
Eosinophils Absolute: 0 10*3/uL (ref 0.0–0.5)
Eosinophils Relative: 0 %
HCT: 44.8 % (ref 39.0–52.0)
Hemoglobin: 15 g/dL (ref 13.0–17.0)
Immature Granulocytes: 0 %
Lymphocytes Relative: 7 %
Lymphs Abs: 0.3 10*3/uL — ABNORMAL LOW (ref 0.7–4.0)
MCH: 29.5 pg (ref 26.0–34.0)
MCHC: 33.5 g/dL (ref 30.0–36.0)
MCV: 88 fL (ref 80.0–100.0)
Monocytes Absolute: 0.1 10*3/uL (ref 0.1–1.0)
Monocytes Relative: 2 %
Neutro Abs: 3.2 10*3/uL (ref 1.7–7.7)
Neutrophils Relative %: 91 %
Platelets: 146 10*3/uL — ABNORMAL LOW (ref 150–400)
RBC: 5.09 MIL/uL (ref 4.22–5.81)
RDW: 13.7 % (ref 11.5–15.5)
WBC: 3.5 10*3/uL — ABNORMAL LOW (ref 4.0–10.5)
nRBC: 0 % (ref 0.0–0.2)

## 2023-04-24 LAB — PROTIME-INR
INR: 2 — ABNORMAL HIGH (ref 0.8–1.2)
Prothrombin Time: 23.3 seconds — ABNORMAL HIGH (ref 11.4–15.2)

## 2023-04-24 LAB — APTT: aPTT: 42 seconds — ABNORMAL HIGH (ref 24–36)

## 2023-04-24 LAB — COMPREHENSIVE METABOLIC PANEL
ALT: 16 U/L (ref 0–44)
AST: 20 U/L (ref 15–41)
Albumin: 3.8 g/dL (ref 3.5–5.0)
Alkaline Phosphatase: 79 U/L (ref 38–126)
Anion gap: 15 (ref 5–15)
BUN: 27 mg/dL — ABNORMAL HIGH (ref 8–23)
CO2: 19 mmol/L — ABNORMAL LOW (ref 22–32)
Calcium: 10 mg/dL (ref 8.9–10.3)
Chloride: 99 mmol/L (ref 98–111)
Creatinine, Ser: 2.29 mg/dL — ABNORMAL HIGH (ref 0.61–1.24)
GFR, Estimated: 28 mL/min — ABNORMAL LOW (ref >60)
Glucose, Bld: 148 mg/dL — ABNORMAL HIGH (ref 70–99)
Potassium: 4.8 mmol/L (ref 3.5–5.1)
Sodium: 133 mmol/L — ABNORMAL LOW (ref 135–145)
Total Bilirubin: 2.1 mg/dL — ABNORMAL HIGH (ref 0.3–1.2)
Total Protein: 7 g/dL (ref 6.5–8.1)

## 2023-04-24 LAB — LACTIC ACID, PLASMA
Lactic Acid, Venous: 3.4 mmol/L (ref 0.5–1.9)
Lactic Acid, Venous: 2.2 mmol/L (ref 0.5–1.9)

## 2023-04-24 LAB — RESP PANEL BY RT-PCR (RSV, FLU A&B, COVID)  RVPGX2
Influenza A by PCR: NEGATIVE
Influenza B by PCR: NEGATIVE
Resp Syncytial Virus by PCR: NEGATIVE
SARS Coronavirus 2 by RT PCR: NEGATIVE

## 2023-04-24 LAB — I-STAT CHEM 8, ED
BUN: 31 mg/dL — ABNORMAL HIGH (ref 8–23)
Calcium, Ion: 1.21 mmol/L (ref 1.15–1.40)
Chloride: 102 mmol/L (ref 98–111)
Creatinine, Ser: 2.2 mg/dL — ABNORMAL HIGH (ref 0.61–1.24)
Glucose, Bld: 141 mg/dL — ABNORMAL HIGH (ref 70–99)
HCT: 46 % (ref 39.0–52.0)
Hemoglobin: 15.6 g/dL (ref 13.0–17.0)
Potassium: 4.7 mmol/L (ref 3.5–5.1)
Sodium: 134 mmol/L — ABNORMAL LOW (ref 135–145)
TCO2: 20 mmol/L — ABNORMAL LOW (ref 22–32)

## 2023-04-24 SURGERY — CYSTOSCOPY, WITH RETROGRADE PYELOGRAM AND URETERAL STENT INSERTION
Anesthesia: General | Laterality: Right

## 2023-04-24 MED ORDER — LACTATED RINGERS IV BOLUS (SEPSIS)
1000.0000 mL | Freq: Once | INTRAVENOUS | Status: AC
  Administered 2023-04-24: 1000 mL via INTRAVENOUS

## 2023-04-24 MED ORDER — SODIUM CHLORIDE 0.9 % IV SOLN
2.0000 g | INTRAVENOUS | Status: DC
  Administered 2023-04-24: 2 g via INTRAVENOUS
  Filled 2023-04-24: qty 20

## 2023-04-24 MED ORDER — LACTATED RINGERS IV SOLN: INTRAVENOUS | Status: DC

## 2023-04-24 MED ORDER — ONDANSETRON 4 MG PO TBDP
ORAL_TABLET | ORAL | Status: AC
  Filled 2023-04-24: qty 1

## 2023-04-24 MED ORDER — ONDANSETRON 4 MG PO TBDP
4.0000 mg | ORAL_TABLET | Freq: Once | ORAL | Status: AC
  Administered 2023-04-24: 4 mg via ORAL

## 2023-04-24 SURGICAL SUPPLY — 22 items
BAG DRN RND TRDRP ANRFLXCHMBR (UROLOGICAL SUPPLIES) ×1
BAG URINE DRAIN 2000ML AR STRL (UROLOGICAL SUPPLIES) ×1 IMPLANT
BAG URO CATCHER STRL LF (MISCELLANEOUS) ×1 IMPLANT
CATH FOLEY 2WAY SLVR 5CC 16FR (CATHETERS) IMPLANT
CATH URETL OPEN END 6FR 70 (CATHETERS) ×1 IMPLANT
GLOVE BIO SURGEON STRL SZ8 (GLOVE) ×1 IMPLANT
GOWN STRL REUS W/ TWL LRG LVL3 (GOWN DISPOSABLE) ×1 IMPLANT
GOWN STRL REUS W/ TWL XL LVL3 (GOWN DISPOSABLE) ×1 IMPLANT
GOWN STRL REUS W/TWL LRG LVL3 (GOWN DISPOSABLE) ×1
GOWN STRL REUS W/TWL XL LVL3 (GOWN DISPOSABLE) ×1
GUIDEWIRE ANG ZIPWIRE 038X150 (WIRE) IMPLANT
GUIDEWIRE STR DUAL SENSOR (WIRE) ×1 IMPLANT
KIT TURNOVER KIT B (KITS) ×1 IMPLANT
MANIFOLD NEPTUNE II (INSTRUMENTS) IMPLANT
NS IRRIG 1000ML POUR BTL (IV SOLUTION) IMPLANT
PACK CYSTO (CUSTOM PROCEDURE TRAY) ×1 IMPLANT
STENT CONTOUR 6FRX26X.038 (STENTS) IMPLANT
STENT URET 6FRX24 CONTOUR (STENTS) IMPLANT
STENT URET 6FRX26 CONTOUR (STENTS) IMPLANT
TOWEL GREEN STERILE FF (TOWEL DISPOSABLE) ×1 IMPLANT
TUBE CONNECTING 12X1/4 (SUCTIONS) IMPLANT
WATER STERILE IRR 3000ML UROMA (IV SOLUTION) ×1 IMPLANT

## 2023-04-24 NOTE — ED Provider Notes (Signed)
[facility] Provider Note   CSN: [id number] Arrival date & time: 04/24/23  1542     History Chief Complaint  Patient presents with   Flank Pain   Nausea   Fever    HPI Garrett Jordan Sr. is a 82 y.o. male presenting for nausea vomiting and right-sided flank pain.  States that this pain was severe yesterday he suspected a kidney stone but states that he had 7 in the past with the past majority of resolved spontaneously. He denies nausea vomiting syncope shortness of breath.  Patient's recorded medical, surgical, social, medication list and allergies were reviewed in the Snapshot window as part of the initial history.   Review of Systems   Review of Systems  Constitutional:  Negative for chills and fever.  HENT:  Negative for ear pain and sore throat.   Eyes:  Negative for pain and visual disturbance.  Respiratory:  Negative for cough and shortness of breath.   Cardiovascular:  Negative for chest pain and palpitations.  Gastrointestinal:  Positive for vomiting. Negative for abdominal pain.  Genitourinary:  Negative for dysuria and hematuria.  Musculoskeletal:  Negative for arthralgias and back pain.  Skin:  Negative for color change and rash.  Neurological:  Negative for seizures and syncope.  All other systems reviewed and are negative.   Physical Exam Updated Vital Signs BP 104/76   Pulse 78   Temp 98.2 F (36.8 C) (Oral)   Resp 17   Ht 5\' 9"  (1.753 m)   Wt 122.5 kg   SpO2 95%   BMI 39.87 kg/m  Physical Exam Vitals and nursing note reviewed.  Constitutional:      General: He is not in acute distress.    Appearance: He is well-developed.  HENT:     Head: Normocephalic and atraumatic.  Eyes:     Conjunctiva/sclera: Conjunctivae normal.  Cardiovascular:     Rate and Rhythm: Normal rate and regular rhythm.     Heart sounds: No murmur heard. Pulmonary:     Effort: Pulmonary effort is normal. No respiratory distress.      Breath sounds: Normal breath sounds.  Abdominal:     Palpations: Abdomen is soft.     Tenderness: There is no abdominal tenderness. There is right CVA tenderness.  Musculoskeletal:        General: No swelling.     Cervical back: Neck supple.  Skin:    General: Skin is warm and dry.     Capillary Refill: Capillary refill takes less than 2 seconds.  Neurological:     Mental Status: He is alert.  Psychiatric:        Mood and Affect: Mood normal.      ED Course/ Medical Decision Making/ A&P    Procedures .Critical Care  Performed by: Glyn Ade, MD Authorized by: Glyn Ade, MD   Critical care provider statement:    Critical care time (minutes):  30   Critical care was necessary to treat or prevent imminent or life-threatening deterioration of the following conditions:  Renal failure and sepsis   Critical care was time spent personally by me on the following activities:  Development of treatment plan with patient or surrogate, discussions with consultants, evaluation of patient's response to treatment, examination of patient, ordering and review of laboratory studies, ordering and review of radiographic studies, ordering and performing treatments and interventions, pulse oximetry, re-evaluation of patient's condition and review of old charts    Medications  Ordered in ED Medications  ondansetron (ZOFRAN-ODT) 4 MG disintegrating tablet (4 mg  Total Dose 04/24/23 1731)  lactated ringers infusion ( Intravenous New Bag/Given 04/24/23 2135)  cefTRIAXone (ROCEPHIN) 2 g in sodium chloride 0.9 % 100 mL IVPB (0 g Intravenous Stopped 04/24/23 1759)  ondansetron (ZOFRAN-ODT) disintegrating tablet 4 mg (4 mg Oral Given 04/24/23 1611)  lactated ringers bolus 1,000 mL (0 mLs Intravenous Stopped 04/24/23 1759)    And  lactated ringers bolus 1,000 mL (0 mLs Intravenous Stopped 04/24/23 1759)    And  lactated ringers bolus 1,000 mL (0 mLs Intravenous Stopped 04/24/23 2033)    And   lactated ringers bolus 1,000 mL (0 mLs Intravenous Stopped 04/24/23 2131)  Medical Decision Making:   Garrett Jordan Sr. is a 82 y.o. male who presented to the ED today with right-sided flank pain, detailed above.    Patient's presentation is complicated by their history of multiple comorbid medical problems.  Patient placed on continuous vitals and telemetry monitoring while in ED which was reviewed periodically.  Complete initial physical exam performed, notably the patient  was with right CVA tenderness.  Patient febrile tachycardic activated as a code sepsis.  Started on empiric antibiotics, IV fluid resuscitation.     Reviewed and confirmed nursing documentation for past medical history, family history, social history.    Initial Assessment:   With the patient's presentation of flank pain, most likely diagnosis is Urologic pathology (including UL vs UTI vs pyelo) vs MSK etiology. Other diagnoses were considered including (but not limited to) gastroenteritis, colitis, small bowel obstruction, appendicitis, cholecystitis, pancreatitis. These are considered less likely due to history of present illness and physical exam findings.   This is most consistent with an acute life/limb threatening illness complicated by underlying chronic conditions.   Initial Plan:  CBC/ Metabolic Panel  to evaluate for underlying infectious/metabolic etiology for patient's abdominal pain  Lipase to evaluate for pancreatitis  EKG to evaluate for cardiac source of pain  CT Ab/pelvis without contrast due to favored urologic over GI etiology for patient's abdominal pain  Urinalysis and repeat physical assessment to evaluate for UTI/Pyelonpehritis  Empiric management of symptoms with escalating pain control and antiemetics as needed.   Initial Study Results:   Laboratory  All laboratory results reviewed without evidence of clinically relevant pathology.    EKG EKG was reviewed independently. Rate, rhythm, axis,  intervals all examined and without medically relevant abnormality. ST segments without concerns for elevations.    Radiology All images reviewed independently. Agree with radiology report at this time.   CT Renal Stone Study  Result Date: 04/24/2023 CLINICAL DATA:  Abdominal/flank pain, stone suspected EXAM: CT ABDOMEN AND PELVIS WITHOUT CONTRAST TECHNIQUE: Multidetector CT imaging of the abdomen and pelvis was performed following the standard protocol without IV contrast. RADIATION DOSE REDUCTION: This exam was performed according to the departmental dose-optimization program which includes automated exposure control, adjustment of the mA and/or kV according to patient size and/or use of iterative reconstruction technique. COMPARISON:  [facility] 08/22/2022 FINDINGS: Lower chest: Mild dependent atelectasis. Trace right pleural effusion. Coronary artery calcifications. Mild cardiomegaly. Hepatobiliary: Diffuse hepatic steatosis. No evidence of focal liver lesion. Multiple layering stones in the gallbladder. No pericholecystic inflammation. No biliary dilatation. Pancreas: Parenchymal atrophy. No ductal dilatation or inflammation. Spleen: Mild splenomegaly, spleen measures 13.5 cm cranial caudal. Adrenals/Urinary Tract: No adrenal nodule. 8 x 8 mm stone in the distal right ureter with moderate proximal hydroureteronephrosis. Mild right perinephric edema. Punctate nonobstructing stone in  the upper pole of the right kidney. Small right renal cyst, needs no further imaging follow-up. Intrarenal stones in the left kidney without left hydronephrosis. No left ureteral calculi. The urinary bladder is partially distended, no bladder stone. Stomach/Bowel: Minimal hiatal hernia. The stomach is nondistended there is no bowel obstruction or inflammation. Moderate volume of colonic stool. Sigmoid colonic redundancy. There is chronic fullness of the proximal appendix at 7 mm which tapers distally, no appendicitis.  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 The aneurysm sac measures 6.6 x 4.7 cm, previously 6.4 x 4.5 cm. Embolization coils within the aneurysm sac. There is no periaortic stranding. Coiling of the right internal iliac artery. 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 No abdominopelvic adenopathy. 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 Other: No ascites or free air. Small fat containing umbilical hernia. Musculoskeletal: Posterior rod with intrapedicular screws L2-L3. The right L3 pedicle screw extends lateral to the vertebral body cortex. Additional degenerative change in the lower lumbar spine. No acute osseous abnormalities. IMPRESSION: 1. Obstructing 8 x 8 mm stone in the distal right ureter with moderate proximal hydroureteronephrosis. 2. Additional nonobstructing stones in both kidneys. 3. 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 The aneurysm sac measures 6.6 x 4.7 cm, previously 6.4 x 4.5 cm. Recommend annual imaging follow-up by CTA or MRA. 4. Hepatic steatosis.  Mild splenomegaly. 5. Cholelithiasis without CT findings of acute cholecystitis. 6. Trace right pleural effusion. Aortic Atherosclerosis (ICD10-I70.0). Electronically Signed   By: Narda Rutherford M.D.   On: 04/24/2023 19:14   DG Chest 2 View  Result Date: 04/24/2023 CLINICAL DATA:  Shortness of breath. EXAM: CHEST - 2 VIEW COMPARISON:  August 09, 2022 FINDINGS: Multiple sternal wires and vascular clips are seen. The heart size and mediastinal contours are within normal limits. There is mild calcification of the aortic arch. Both lungs are clear. The visualized skeletal structures are unremarkable. IMPRESSION: 1. Evidence of prior median sternotomy/CABG. 2. No active cardiopulmonary disease. Electronically Signed   By: Aram Candela M.D.   On: 04/24/2023 17:39   Final Reassessment and Plan:   Urology was consulted based on this presentation.  Concern for  obstructing nephrolithiasis with resulting urosepsis.  Urology plan for operative intervention immediately.  Disposition:   Based on the above findings, I believe this patient is stable for admission.    Patient/family educated about specific findings on our evaluation and explained exact reasons for admission.  Patient/family educated about clinical situation and time was allowed to answer questions.   Admission team communicated with and agreed with need for admission. Patient admitted. Patient ready to move at this time.     Emergency Department Medication Summary:   Medications  ondansetron (ZOFRAN-ODT) 4 MG disintegrating tablet (4 mg  Total Dose 04/24/23 1731)  lactated ringers infusion ( Intravenous New Bag/Given 04/24/23 2135)  cefTRIAXone (ROCEPHIN) 2 g in sodium chloride 0.9 % 100 mL IVPB (0 g Intravenous Stopped 04/24/23 1759)  ondansetron (ZOFRAN-ODT) disintegrating tablet 4 mg (4 mg Oral Given 04/24/23 1611)  lactated ringers bolus 1,000 mL (0 mLs Intravenous Stopped 04/24/23 1759)    And  lactated ringers bolus 1,000 mL (0 mLs Intravenous Stopped 04/24/23 1759)    And  lactated ringers bolus 1,000 mL (0 mLs Intravenous Stopped 04/24/23 2033)    And  lactated ringers bolus 1,000 mL (0 mLs Intravenous Stopped 04/24/23 2131)       .     Clinical Impression:  1. Calculus of  upper urinary tract   2. Pyelonephritis      Admit   Final Clinical Impression(s) / ED Diagnoses Final diagnoses:  Calculus of upper urinary tract  Pyelonephritis    Rx / DC Orders ED Discharge Orders     None         Glyn Ade, MD 04/25/23 0045

## 2023-04-24 NOTE — Consult Note (Signed)
I have been asked to see the patient by Dr. Glyn Ade, for evaluation and management of right ureteral stone and fever.  History of present illness: 82 year old male presented to the emergency department with 7 or 8 days of pain, dysuria, and lethargy.  Over the last 24 hours she has had fevers and chills.  He had no real associated nausea and vomiting.  In the emergency department he was noted to have an elevated lactate although his white blood cell count below normal.  His creatinine was elevated, but he does have some mild renal insufficiency.  His urine analysis demonstrated lots of leukocytes with some bacteria and lots of blood.  A CT scan was subsequently performed demonstrating a 7 mm distal left ureteral stone with associated hydronephrosis.  I was consulted for further evaluation.  Once the patient presented the emergency department his pain was quickly treated and subsequently resolved.  He was febrile initially on presentation, but has not had any fevers and has been stable from a vital sign perspective since being observed.  The patient has a past urologic history for prostate cancer.  He was treated in 2008.  He had no side effects or difficulty with the radiation treatment.  He has not had any evidence of recurrence.  He has not had any gross hematuria.  The patient has also had numerous stones.  He had an open ureteral lithotomy in the 1980s and shockwave lithotripsy.  He is never had ureteroscopy.  He has been able to pass most of the stones on his own.  The patient also has severe cardiac and vascular history.  He has a AAA bypass graft, A-fib, and a history of ST elevation MI.  He is fully anticoagulated with both Plavix and Eliquis.  Review of systems: A 12 point comprehensive review of systems was obtained and is negative unless otherwise stated in the history of present illness.  Patient Active Problem List   Diagnosis Date Noted   S/P lumbar fusion 11/10/2022   Diabetes  mellitus (HCC) 09/21/2022   CKD (chronic kidney disease), stage III (HCC) 06/18/2022   STEMI (ST elevation myocardial infarction) (HCC) 06/15/2022   Hypercoagulable state due to atrial fibrillation (HCC) 01/23/2022   Type 2 diabetes mellitus with stage 3b chronic kidney disease, without long-term current use of insulin (HCC) 09/29/2021   Ascending aortic aneurysm (HCC) 07/15/2021   Intermittent palpitations 03/24/2020   Hypertriglyceridemia 03/17/2020   Hypercalcemia 03/17/2020   Type 2 diabetes mellitus with hyperglycemia, with long-term current use of insulin (HCC) 03/17/2020   Penetrating ulcer of aorta (HCC) 03/03/2020   Paroxysmal atrial fibrillation (HCC) 12/02/2018   Pain in right knee 06/27/2018   Endoleak post endovascular aneurysm repair    Endoleak of aortic graft 07/16/2015   S/P angioplasty 07/16/2015   Abdominal aortic aneurysm (HCC)    AAA (abdominal aortic aneurysm) without rupture (HCC)    Chest pain 01/07/2015   CAD (coronary artery disease)    Hypertensive heart disease without CHF    Hyperlipidemia    S/P CABG (coronary artery bypass graft)    Personal history of prostate cancer    Hypothyroidism    BPH (benign prostatic hypertrophy)    Gout    Lumbar disc disease    History of kidney stones    History of hyperparathyroidism    Gallstones     No current facility-administered medications on file prior to encounter.   Current Outpatient Medications on File Prior to Encounter  Medication Sig Dispense Refill  acetaminophen (TYLENOL) 650 MG CR tablet Take 650 mg by mouth every 8 (eight) hours as needed for pain.     allopurinol (ZYLOPRIM) 300 MG tablet Take 300 mg by mouth every other day. on even days of the month     amLODipine (NORVASC) 10 MG tablet Take 1 tablet (10 mg total) by mouth daily. 90 tablet 3   apixaban (ELIQUIS) 5 MG TABS tablet Take 1 tablet (5 mg total) by mouth 2 (two) times daily. 180 tablet 1   Ascorbic Acid (VITAMIN C) 500 MG CAPS Take  500 mg by mouth daily at 6 (six) AM.     atorvastatin (LIPITOR) 40 MG tablet Take 1 tablet (40 mg total) by mouth daily. 30 tablet 11   carvedilol (COREG) 6.25 MG tablet Take 1 tablet (6.25 mg total) by mouth 2 (two) times daily. 180 tablet 3   Cholecalciferol (VITAMIN D3) 2000 UNITS TABS Take 2,000 Units by mouth daily.      clonazePAM (KLONOPIN) 1 MG tablet Take 0.5 mg by mouth at bedtime.     clopidogrel (PLAVIX) 75 MG tablet Take 1 tablet (75 mg total) by mouth daily. 90 tablet 3   Coenzyme Q10 300 MG CAPS Take 300 mg by mouth daily.     dapagliflozin propanediol (FARXIGA) 10 MG TABS tablet Take 1 tablet (10 mg total) by mouth daily before breakfast. 90 tablet 3   ezetimibe (ZETIA) 10 MG tablet TAKE 1 TABLET BY MOUTH DAILY 100 tablet 2   fenofibrate micronized (LOFIBRA) 200 MG capsule TAKE 1 CAPSULE BY MOUTH DAILY  BEFORE BREAKFAST (Patient taking differently: Take 200 mg by mouth daily before breakfast.) 100 capsule 2   fluorouracil (EFUDEX) 5 % cream Apply 1 Application topically daily as needed (cancer spots). Use as directed     fluticasone (FLONASE) 50 MCG/ACT nasal spray Place 2 sprays into both nostrils daily as needed for allergies.     glipiZIDE (GLUCOTROL) 5 MG tablet Take 0.5 tablets (2.5 mg total) by mouth 2 (two) times daily before a meal. 90 tablet 3   isosorbide mononitrate (IMDUR) 30 MG 24 hr tablet Take 1 tablet (30 mg total) by mouth daily. 90 tablet 3   levothyroxine (SYNTHROID, LEVOTHROID) 175 MCG tablet Take 175 mcg by mouth daily before breakfast.     lidocaine (ASPERCREME LIDOCAINE) 4 % Place 1 patch onto the skin daily as needed (pain).     loratadine (CLARITIN) 10 MG tablet Take 10 mg by mouth at bedtime.     magnesium oxide (MAG-OX) 400 (240 Mg) MG tablet Take 400 mg by mouth daily.     Multiple Vitamins-Minerals (PRESERVISION AREDS 2) CAPS Take 1 capsule by mouth 2 (two) times daily.     nitroGLYCERIN (NITROSTAT) 0.4 MG SL tablet Place 1 tablet (0.4 mg total) under  the tongue every 5 (five) minutes as needed for chest pain. 25 tablet 3   olmesartan (BENICAR) 40 MG tablet Take 1 tablet (40 mg total) by mouth daily. 90 tablet 3   oxyCODONE 10 MG TABS Take 1 tablet (10 mg total) by mouth every 4 (four) hours as needed for severe pain ((score 7 to 10)). 30 tablet 0   pantoprazole (PROTONIX) 40 MG tablet TAKE 1 TABLET BY MOUTH DAILY (Patient taking differently: Take 40 mg by mouth daily.) 90 tablet 3   Plant Sterols and Stanols (CHOLESTOFF PLUS PO) Take 1 capsule by mouth daily.     Polyethyl Glycol-Propyl Glycol (SYSTANE OP) Place 1 drop into both eyes daily  as needed (dry/irritated eyes).     Tamsulosin HCl (FLOMAX) 0.4 MG CAPS Take 0.4 mg by mouth every other day.     triamcinolone cream (KENALOG) 0.1 % Apply 1 application  topically daily as needed (rash).  2   TURMERIC PO Take 2 tablets by mouth in the morning and at bedtime.     VASCEPA 1 g capsule TAKE 2 CAPSULES BY MOUTH TWICE A DAY 360 capsule 3   ACCU-CHEK GUIDE test strip USE TWICE DAILY 200 strip 3   Blood Pressure Monitoring (OMRON 3 SERIES BP MONITOR) DEVI 1 each by Does not apply route daily. 1 each 0    Past Medical History:  Diagnosis Date   AAA (abdominal aortic aneurysm) (HCC)    Arthritis    knees   Atrial fibrillation (HCC)    CAD (coronary artery disease)    Cancer (HCC)    prostate   CKD (chronic kidney disease), stage III (HCC) 06/18/2022   COVID    06/2019 and in 2022 states they were mild cases   Diabetes mellitus without complication (HCC)    Dyspnea    once in a while   Dysrhythmia    A-fib pt is on Eliquis   GERD (gastroesophageal reflux disease)    Gout    History of kidney stones    Hyperlipidemia    Hypertension    Hypothyroidism    Myocardial infarction (HCC) 01/1991   sp inferior   Nerve compression    right leg   Reflux    Seasonal allergies    Thoracic ascending aortic aneurysm (HCC)    Thyroid disease    hypothyroidism    Past Surgical History:   Procedure Laterality Date   ABDOMINAL AORTAGRAM N/A 02/28/2012   Procedure: ABDOMINAL Ronny Flurry;  Surgeon: Nada Libman, MD;  Location: [facility] CATH LAB;  Service: Cardiovascular;  Laterality: N/A;   abdominal aortagram embolization  02/28/2012   ABDOMINAL AORTIC ANEURYSM REPAIR  07/14/2012   CARDIAC CATHETERIZATION     CATARACT EXTRACTION Bilateral 2016   CORONARY ARTERY BYPASS GRAFT  04/13/2009   CORONARY BALLOON ANGIOPLASTY N/A 06/15/2022   Procedure: CORONARY BALLOON ANGIOPLASTY;  Surgeon: Tonny Bollman, MD;  Location: [facility] INVASIVE CV LAB;  Service: Cardiovascular;  Laterality: N/A;   CORONARY STENT PLACEMENT  1992 and  2009   RCA   EMBOLIZATION Right 02/28/2012   Procedure: EMBOLIZATION;  Surgeon: Nada Libman, MD;  Location: [facility] CATH LAB;  Service: Cardiovascular;  Laterality: Right;   IR GENERIC HISTORICAL  12/05/2016   IR RADIOLOGIST EVAL & MGMT 12/05/2016 Malachy Moan, MD [facility] INTERV RAD   IR RADIOLOGIST EVAL & MGMT  07/10/2019   IR RADIOLOGIST EVAL & MGMT  08/25/2020   IR RADIOLOGIST EVAL & MGMT  08/16/2021   IR RADIOLOGIST EVAL & MGMT  08/31/2022   KIDNEY STONE SURGERY     LAMINECTOMY WITH POSTERIOR LATERAL ARTHRODESIS LEVEL 1 Bilateral 11/10/2022   Procedure: Lumbar Two-Lumbar Three laminectomy  - bilateral with posterior lateral fusion and interspinous plating;  Surgeon: Tia Alert, MD;  Location: [facility] OR;  Service: Neurosurgery;  Laterality: Bilateral;   LEFT HEART CATH AND CORONARY ANGIOGRAPHY N/A 06/15/2022   Procedure: LEFT HEART CATH AND CORONARY ANGIOGRAPHY;  Surgeon: Tonny Bollman, MD;  Location: [facility] INVASIVE CV LAB;  Service: Cardiovascular;  Laterality: N/A;   LEFT HEART CATHETERIZATION WITH CORONARY/GRAFT ANGIOGRAM N/A 01/08/2015   Procedure: LEFT HEART CATHETERIZATION WITH Isabel Caprice;  Surgeon: Othella Boyer, MD;  Location: [facility] CATH LAB;  Service: Cardiovascular;  Laterality: N/A;   parathyroid adenoma  1981   surgery   PROSTATE SURGERY      rad seeds   SPINE SURGERY  2006   TONSILLECTOMY  1955    Social History   Tobacco Use   Smoking status: Former    Types: Cigarettes    Quit date: 08/01/1970    Years since quitting: 52.7   Smokeless tobacco: Never  Vaping Use   Vaping Use: Never used  Substance Use Topics   Alcohol use: Yes    Alcohol/week: 6.0 standard drinks of alcohol    Types: 3 Glasses of wine, 3 Cans of beer per week    Comment: 1-3 glasses 3-4 times a week   Drug use: No    Family History  Problem Relation Age of Onset   Heart disease Father        Heart Disease before age 68   Heart attack Father    Hyperlipidemia Father    Hypertension Father    Stroke Mother    Deep vein thrombosis Mother    Heart disease Sister    Diabetes Sister    Hyperlipidemia Sister    Hypertension Sister    Heart attack Sister    Heart disease Paternal Uncle    Diabetes Maternal Grandmother    Diabetes Sister     PE: Vitals:   04/24/23 1915 04/24/23 1948 04/24/23 2000 04/24/23 2045  BP: 94/68  94/71 104/76  Pulse: 83  76 78  Resp: 15  15 17   Temp:  98.2 F (36.8 C)    TempSrc:  Oral    SpO2: 94%  95% 95%  Weight:      Height:       Patient appears to be in no acute distress  patient is alert and oriented x3 Atraumatic normocephalic head No cervical or supraclavicular lymphadenopathy appreciated No increased work of breathing, no audible wheezes/rhonchi A-fib with a normal rate Abdomen is soft, nontender, nondistended, right CVA tenderness or suprapubic tenderness Lower extremities are symmetric without appreciable edema Grossly neurologically intact No identifiable skin lesions  Recent Labs    04/24/23 1613 04/24/23 1620  WBC 3.5*  --   HGB 15.0 15.6  HCT 44.8 46.0   Recent Labs    04/24/23 1613 04/24/23 1620  NA 133* 134*  K 4.8 4.7  CL 99 102  CO2 19*  --   GLUCOSE 148* 141*  BUN 27* 31*  CREATININE 2.29* 2.20*  CALCIUM 10.0  --    Recent Labs    04/24/23 1613  INR 2.0*    No results for input(s): "LABURIN" in the last 72 hours. Results for orders placed or performed during the hospital encounter of 04/24/23  Resp panel by RT-PCR (RSV, Flu A&B, Covid) Anterior Nasal Swab     Status: None   Collection Time: 04/24/23  4:56 PM   Specimen: Anterior Nasal Swab  Result Value Ref Range Status   SARS Coronavirus 2 by RT PCR NEGATIVE NEGATIVE Final   Influenza A by PCR NEGATIVE NEGATIVE Final   Influenza B by PCR NEGATIVE NEGATIVE Final    Comment: (NOTE) The Xpert Xpress SARS-CoV-2/FLU/RSV plus assay is intended as an aid in the diagnosis of influenza from Nasopharyngeal swab specimens and should not be used as a sole basis for treatment. Nasal washings and aspirates are unacceptable for Xpert Xpress SARS-CoV-2/FLU/RSV testing.  Fact Sheet for Patients: BloggerCourse.com  Fact Sheet for Healthcare Providers: SeriousBroker.it  This test is not  yet approved or cleared by the [place] and has been authorized for detection and/or diagnosis of SARS-CoV-2 by FDA under an Emergency Use Authorization (EUA). This EUA will remain in effect (meaning this test can be used) for the duration of the COVID-19 declaration under Section 564(b)(1) of the Act, 21 U.S.C. section 360bbb-3(b)(1), unless the authorization is terminated or revoked.     Resp Syncytial Virus by PCR NEGATIVE NEGATIVE Final    Comment: (NOTE) Fact Sheet for Patients: BloggerCourse.com  Fact Sheet for Healthcare Providers: SeriousBroker.it  This test is not yet approved or cleared by the [place] FDA and has been authorized for detection and/or diagnosis of SARS-CoV-2 by FDA under an Emergency Use Authorization (EUA). This EUA will remain in effect (meaning this test can be used) for the duration of the COVID-19 declaration under Section 564(b)(1) of the Act, 21 U.S.C. section  360bbb-3(b)(1), unless the authorization is terminated or revoked.  Performed at [facility], [address], [place], [place] [address]     Imaging: I reviewed the patient's CT scan which is notable for 7 mm right distal ureteral stone.  Imp: Although the patient's pain is much better controlled and his vital signs have been stable after 4 L of fluid he did have rigors and fevers over the past 24 hours consistent with a pyelonephritis associated with stone.  Unfortunately he is also fully anticoagulated with both Plavix and Eliquis.  Recommendations: #1: Recommended that we proceed to the operating room placed a stent to help drain that kidney and his associated infection.  He will need antibiotics for 2 additional weeks following the stent being placed.  He will then need to have the stone removed. #2: In order to do this, he will need to stop his anticoagulation, and as such will need cardiology consult while in the hospital.   Thank you for involving me in this patient's care, I will continue to follow along   Crist Fat

## 2023-04-24 NOTE — ED Triage Notes (Signed)
Pt reports reports right flank pain, nausea, and vomiting since yesterday. Pt denies urinary symptoms. Hx of kidney stones. Pt is febrile during triage and vomited once. Pt reports he took tylenol around 1500.

## 2023-04-25 ENCOUNTER — Inpatient Hospital Stay (HOSPITAL_BASED_OUTPATIENT_CLINIC_OR_DEPARTMENT_OTHER): Payer: Medicare Other | Admitting: Anesthesiology

## 2023-04-25 ENCOUNTER — Observation Stay (HOSPITAL_COMMUNITY): Payer: Medicare Other

## 2023-04-25 ENCOUNTER — Encounter (HOSPITAL_COMMUNITY): Payer: Self-pay | Admitting: Urology

## 2023-04-25 ENCOUNTER — Inpatient Hospital Stay (HOSPITAL_COMMUNITY): Payer: Medicare Other | Admitting: Anesthesiology

## 2023-04-25 DIAGNOSIS — N139 Obstructive and reflux uropathy, unspecified: Secondary | ICD-10-CM | POA: Diagnosis not present

## 2023-04-25 DIAGNOSIS — N281 Cyst of kidney, acquired: Secondary | ICD-10-CM | POA: Diagnosis not present

## 2023-04-25 DIAGNOSIS — N12 Tubulo-interstitial nephritis, not specified as acute or chronic: Secondary | ICD-10-CM | POA: Diagnosis present

## 2023-04-25 DIAGNOSIS — I25708 Atherosclerosis of coronary artery bypass graft(s), unspecified, with other forms of angina pectoris: Secondary | ICD-10-CM

## 2023-04-25 DIAGNOSIS — I129 Hypertensive chronic kidney disease with stage 1 through stage 4 chronic kidney disease, or unspecified chronic kidney disease: Secondary | ICD-10-CM | POA: Diagnosis not present

## 2023-04-25 DIAGNOSIS — I2510 Atherosclerotic heart disease of native coronary artery without angina pectoris: Secondary | ICD-10-CM | POA: Diagnosis not present

## 2023-04-25 DIAGNOSIS — I482 Chronic atrial fibrillation, unspecified: Secondary | ICD-10-CM | POA: Diagnosis not present

## 2023-04-25 DIAGNOSIS — N200 Calculus of kidney: Secondary | ICD-10-CM | POA: Diagnosis not present

## 2023-04-25 DIAGNOSIS — N132 Hydronephrosis with renal and ureteral calculous obstruction: Secondary | ICD-10-CM | POA: Diagnosis not present

## 2023-04-25 DIAGNOSIS — E782 Mixed hyperlipidemia: Secondary | ICD-10-CM

## 2023-04-25 DIAGNOSIS — R310 Gross hematuria: Secondary | ICD-10-CM | POA: Diagnosis not present

## 2023-04-25 DIAGNOSIS — I252 Old myocardial infarction: Secondary | ICD-10-CM | POA: Diagnosis not present

## 2023-04-25 DIAGNOSIS — N179 Acute kidney failure, unspecified: Secondary | ICD-10-CM

## 2023-04-25 DIAGNOSIS — Z1152 Encounter for screening for COVID-19: Secondary | ICD-10-CM | POA: Diagnosis not present

## 2023-04-25 DIAGNOSIS — I480 Paroxysmal atrial fibrillation: Secondary | ICD-10-CM

## 2023-04-25 DIAGNOSIS — E871 Hypo-osmolality and hyponatremia: Secondary | ICD-10-CM | POA: Diagnosis not present

## 2023-04-25 DIAGNOSIS — I10 Essential (primary) hypertension: Secondary | ICD-10-CM

## 2023-04-25 DIAGNOSIS — M170 Bilateral primary osteoarthritis of knee: Secondary | ICD-10-CM | POA: Diagnosis not present

## 2023-04-25 DIAGNOSIS — I714 Abdominal aortic aneurysm, without rupture, unspecified: Secondary | ICD-10-CM | POA: Diagnosis not present

## 2023-04-25 DIAGNOSIS — Z7984 Long term (current) use of oral hypoglycemic drugs: Secondary | ICD-10-CM | POA: Diagnosis not present

## 2023-04-25 DIAGNOSIS — Z87891 Personal history of nicotine dependence: Secondary | ICD-10-CM | POA: Diagnosis not present

## 2023-04-25 DIAGNOSIS — N1832 Chronic kidney disease, stage 3b: Secondary | ICD-10-CM | POA: Diagnosis not present

## 2023-04-25 DIAGNOSIS — D72819 Decreased white blood cell count, unspecified: Secondary | ICD-10-CM | POA: Diagnosis not present

## 2023-04-25 DIAGNOSIS — Z955 Presence of coronary angioplasty implant and graft: Secondary | ICD-10-CM | POA: Diagnosis not present

## 2023-04-25 DIAGNOSIS — E039 Hypothyroidism, unspecified: Secondary | ICD-10-CM

## 2023-04-25 DIAGNOSIS — Z95828 Presence of other vascular implants and grafts: Secondary | ICD-10-CM | POA: Diagnosis not present

## 2023-04-25 DIAGNOSIS — D6832 Hemorrhagic disorder due to extrinsic circulating anticoagulants: Secondary | ICD-10-CM | POA: Diagnosis not present

## 2023-04-25 DIAGNOSIS — N201 Calculus of ureter: Secondary | ICD-10-CM | POA: Diagnosis not present

## 2023-04-25 DIAGNOSIS — E1165 Type 2 diabetes mellitus with hyperglycemia: Secondary | ICD-10-CM | POA: Diagnosis not present

## 2023-04-25 DIAGNOSIS — Z7902 Long term (current) use of antithrombotics/antiplatelets: Secondary | ICD-10-CM | POA: Diagnosis not present

## 2023-04-25 DIAGNOSIS — E1122 Type 2 diabetes mellitus with diabetic chronic kidney disease: Secondary | ICD-10-CM

## 2023-04-25 DIAGNOSIS — I130 Hypertensive heart and chronic kidney disease with heart failure and stage 1 through stage 4 chronic kidney disease, or unspecified chronic kidney disease: Secondary | ICD-10-CM | POA: Diagnosis not present

## 2023-04-25 DIAGNOSIS — E119 Type 2 diabetes mellitus without complications: Secondary | ICD-10-CM | POA: Diagnosis not present

## 2023-04-25 DIAGNOSIS — R197 Diarrhea, unspecified: Secondary | ICD-10-CM | POA: Diagnosis not present

## 2023-04-25 DIAGNOSIS — E785 Hyperlipidemia, unspecified: Secondary | ICD-10-CM | POA: Diagnosis not present

## 2023-04-25 DIAGNOSIS — Z7901 Long term (current) use of anticoagulants: Secondary | ICD-10-CM | POA: Diagnosis not present

## 2023-04-25 DIAGNOSIS — A419 Sepsis, unspecified organism: Secondary | ICD-10-CM | POA: Diagnosis not present

## 2023-04-25 DIAGNOSIS — Y842 Radiological procedure and radiotherapy as the cause of abnormal reaction of the patient, or of later complication, without mention of misadventure at the time of the procedure: Secondary | ICD-10-CM | POA: Diagnosis present

## 2023-04-25 DIAGNOSIS — E876 Hypokalemia: Secondary | ICD-10-CM | POA: Diagnosis not present

## 2023-04-25 DIAGNOSIS — Z5181 Encounter for therapeutic drug level monitoring: Secondary | ICD-10-CM | POA: Diagnosis not present

## 2023-04-25 DIAGNOSIS — Z951 Presence of aortocoronary bypass graft: Secondary | ICD-10-CM

## 2023-04-25 DIAGNOSIS — I25810 Atherosclerosis of coronary artery bypass graft(s) without angina pectoris: Secondary | ICD-10-CM | POA: Diagnosis not present

## 2023-04-25 DIAGNOSIS — N189 Chronic kidney disease, unspecified: Secondary | ICD-10-CM | POA: Diagnosis not present

## 2023-04-25 DIAGNOSIS — N136 Pyonephrosis: Secondary | ICD-10-CM | POA: Diagnosis not present

## 2023-04-25 DIAGNOSIS — N400 Enlarged prostate without lower urinary tract symptoms: Secondary | ICD-10-CM

## 2023-04-25 DIAGNOSIS — D696 Thrombocytopenia, unspecified: Secondary | ICD-10-CM | POA: Diagnosis not present

## 2023-04-25 DIAGNOSIS — Z79899 Other long term (current) drug therapy: Secondary | ICD-10-CM | POA: Diagnosis not present

## 2023-04-25 DIAGNOSIS — E1151 Type 2 diabetes mellitus with diabetic peripheral angiopathy without gangrene: Secondary | ICD-10-CM

## 2023-04-25 DIAGNOSIS — Z8616 Personal history of COVID-19: Secondary | ICD-10-CM | POA: Diagnosis not present

## 2023-04-25 DIAGNOSIS — Z01810 Encounter for preprocedural cardiovascular examination: Secondary | ICD-10-CM | POA: Diagnosis not present

## 2023-04-25 DIAGNOSIS — N10 Acute tubulo-interstitial nephritis: Secondary | ICD-10-CM | POA: Diagnosis not present

## 2023-04-25 DIAGNOSIS — N390 Urinary tract infection, site not specified: Secondary | ICD-10-CM | POA: Diagnosis not present

## 2023-04-25 DIAGNOSIS — E8721 Acute metabolic acidosis: Secondary | ICD-10-CM | POA: Diagnosis not present

## 2023-04-25 DIAGNOSIS — T45515A Adverse effect of anticoagulants, initial encounter: Secondary | ICD-10-CM | POA: Diagnosis not present

## 2023-04-25 DIAGNOSIS — R339 Retention of urine, unspecified: Secondary | ICD-10-CM | POA: Diagnosis not present

## 2023-04-25 DIAGNOSIS — N3001 Acute cystitis with hematuria: Secondary | ICD-10-CM | POA: Diagnosis not present

## 2023-04-25 DIAGNOSIS — I4510 Unspecified right bundle-branch block: Secondary | ICD-10-CM | POA: Diagnosis not present

## 2023-04-25 DIAGNOSIS — E1141 Type 2 diabetes mellitus with diabetic mononeuropathy: Secondary | ICD-10-CM | POA: Diagnosis not present

## 2023-04-25 DIAGNOSIS — R0602 Shortness of breath: Secondary | ICD-10-CM | POA: Diagnosis not present

## 2023-04-25 LAB — URINE CULTURE
Culture: NO GROWTH
Culture: NO GROWTH

## 2023-04-25 LAB — CBC
HCT: 37.9 % — ABNORMAL LOW (ref 39.0–52.0)
Hemoglobin: 12.4 g/dL — ABNORMAL LOW (ref 13.0–17.0)
MCH: 29.3 pg (ref 26.0–34.0)
MCHC: 32.7 g/dL (ref 30.0–36.0)
MCV: 89.6 fL (ref 80.0–100.0)
Platelets: 110 10*3/uL — ABNORMAL LOW (ref 150–400)
RBC: 4.23 MIL/uL (ref 4.22–5.81)
RDW: 14 % (ref 11.5–15.5)
WBC: 6.9 10*3/uL (ref 4.0–10.5)
nRBC: 0 % (ref 0.0–0.2)

## 2023-04-25 LAB — HEMOGLOBIN A1C
Hgb A1c MFr Bld: 6.1 % — ABNORMAL HIGH (ref 4.8–5.6)
Mean Plasma Glucose: 128.37 mg/dL

## 2023-04-25 LAB — GLUCOSE, CAPILLARY
Glucose-Capillary: 122 mg/dL — ABNORMAL HIGH (ref 70–99)
Glucose-Capillary: 115 mg/dL — ABNORMAL HIGH (ref 70–99)
Glucose-Capillary: 155 mg/dL — ABNORMAL HIGH (ref 70–99)
Glucose-Capillary: 158 mg/dL — ABNORMAL HIGH (ref 70–99)

## 2023-04-25 LAB — BASIC METABOLIC PANEL
Anion gap: 11 (ref 5–15)
BUN: 28 mg/dL — ABNORMAL HIGH (ref 8–23)
CO2: 20 mmol/L — ABNORMAL LOW (ref 22–32)
Calcium: 9.2 mg/dL (ref 8.9–10.3)
Chloride: 103 mmol/L (ref 98–111)
Creatinine, Ser: 1.88 mg/dL — ABNORMAL HIGH (ref 0.61–1.24)
GFR, Estimated: 36 mL/min — ABNORMAL LOW (ref >60)
Glucose, Bld: 135 mg/dL — ABNORMAL HIGH (ref 70–99)
Potassium: 4.4 mmol/L (ref 3.5–5.1)
Sodium: 134 mmol/L — ABNORMAL LOW (ref 135–145)

## 2023-04-25 MED ORDER — ROCURONIUM BROMIDE 10 MG/ML (PF) SYRINGE
PREFILLED_SYRINGE | INTRAVENOUS | Status: AC
  Filled 2023-04-25: qty 20

## 2023-04-25 MED ORDER — SUCCINYLCHOLINE CHLORIDE 200 MG/10ML IV SOSY
PREFILLED_SYRINGE | INTRAVENOUS | Status: AC
  Filled 2023-04-25: qty 20

## 2023-04-25 MED ORDER — LACTATED RINGERS IV SOLN: INTRAVENOUS | Status: DC

## 2023-04-25 MED ORDER — MAGNESIUM OXIDE -MG SUPPLEMENT 400 (240 MG) MG PO TABS
400.0000 mg | ORAL_TABLET | Freq: Every day | ORAL | Status: DC
  Administered 2023-04-25: 400 mg via ORAL
  Filled 2023-04-25: qty 1

## 2023-04-25 MED ORDER — LACTATED RINGERS IV SOLN: INTRAVENOUS | Status: DC | PRN

## 2023-04-25 MED ORDER — PHENYLEPHRINE HCL (PRESSORS) 10 MG/ML IV SOLN
INTRAVENOUS | Status: DC | PRN
  Administered 2023-04-25 (×2): 80 ug via INTRAVENOUS

## 2023-04-25 MED ORDER — FENTANYL CITRATE (PF) 100 MCG/2ML IJ SOLN: 25.0000 ug | INTRAMUSCULAR | Status: DC | PRN

## 2023-04-25 MED ORDER — HYDROMORPHONE HCL 1 MG/ML IJ SOLN: 0.5000 mg | INTRAMUSCULAR | Status: DC | PRN

## 2023-04-25 MED ORDER — SULFAMETHOXAZOLE-TRIMETHOPRIM 800-160 MG PO TABS
1.0000 | ORAL_TABLET | Freq: Two times a day (BID) | ORAL | Status: DC
  Administered 2023-04-25 (×2): 1 via ORAL
  Filled 2023-04-25 (×2): qty 1

## 2023-04-25 MED ORDER — PROPOFOL 10 MG/ML IV BOLUS
INTRAVENOUS | Status: DC | PRN
  Administered 2023-04-25: 120 mg via INTRAVENOUS

## 2023-04-25 MED ORDER — SODIUM CHLORIDE 0.45 % IV SOLN: INTRAVENOUS | Status: DC

## 2023-04-25 MED ORDER — FENTANYL CITRATE (PF) 250 MCG/5ML IJ SOLN
INTRAMUSCULAR | Status: AC
  Filled 2023-04-25: qty 5

## 2023-04-25 MED ORDER — ISOSORBIDE MONONITRATE ER 30 MG PO TB24
30.0000 mg | ORAL_TABLET | Freq: Every day | ORAL | Status: DC
  Administered 2023-04-25: 30 mg via ORAL
  Filled 2023-04-25: qty 1

## 2023-04-25 MED ORDER — LEVOTHYROXINE SODIUM 75 MCG PO TABS
175.0000 ug | ORAL_TABLET | Freq: Every day | ORAL | Status: DC
  Administered 2023-04-25: 175 ug via ORAL
  Filled 2023-04-25: qty 1

## 2023-04-25 MED ORDER — PROPOFOL 10 MG/ML IV BOLUS
INTRAVENOUS | Status: AC
  Filled 2023-04-25: qty 20

## 2023-04-25 MED ORDER — INSULIN ASPART 100 UNIT/ML IJ SOLN
0.0000 [IU] | INTRAMUSCULAR | Status: DC
  Administered 2023-04-25 (×2): 3 [IU] via SUBCUTANEOUS

## 2023-04-25 MED ORDER — APIXABAN 5 MG PO TABS
5.0000 mg | ORAL_TABLET | Freq: Two times a day (BID) | ORAL | Status: DC
  Administered 2023-04-25: 5 mg via ORAL
  Filled 2023-04-25: qty 1

## 2023-04-25 MED ORDER — SODIUM CHLORIDE 0.9 % IR SOLN
Status: DC | PRN
  Administered 2023-04-25: 3000 mL via INTRAVESICAL

## 2023-04-25 MED ORDER — PHENYLEPHRINE 80 MCG/ML (10ML) SYRINGE FOR IV PUSH (FOR BLOOD PRESSURE SUPPORT)
PREFILLED_SYRINGE | INTRAVENOUS | Status: AC
  Filled 2023-04-25: qty 20

## 2023-04-25 MED ORDER — CARVEDILOL 6.25 MG PO TABS
6.2500 mg | ORAL_TABLET | Freq: Two times a day (BID) | ORAL | Status: DC
  Administered 2023-04-25: 6.25 mg via ORAL
  Filled 2023-04-25: qty 1

## 2023-04-25 MED ORDER — SULFAMETHOXAZOLE-TRIMETHOPRIM 400-80 MG PO TABS
1.0000 | ORAL_TABLET | Freq: Two times a day (BID) | ORAL | Status: DC
  Filled 2023-04-25: qty 1

## 2023-04-25 MED ORDER — ONDANSETRON HCL 4 MG/2ML IJ SOLN
INTRAMUSCULAR | Status: AC
  Filled 2023-04-25: qty 2

## 2023-04-25 MED ORDER — AMLODIPINE BESYLATE 10 MG PO TABS
10.0000 mg | ORAL_TABLET | Freq: Every day | ORAL | Status: DC
  Administered 2023-04-25: 10 mg via ORAL
  Filled 2023-04-25: qty 1

## 2023-04-25 MED ORDER — TAMSULOSIN HCL 0.4 MG PO CAPS
0.4000 mg | ORAL_CAPSULE | ORAL | Status: DC
  Administered 2023-04-25: 0.4 mg via ORAL
  Filled 2023-04-25: qty 1

## 2023-04-25 MED ORDER — CLOPIDOGREL BISULFATE 75 MG PO TABS
75.0000 mg | ORAL_TABLET | Freq: Every day | ORAL | Status: DC
  Administered 2023-04-25: 75 mg via ORAL
  Filled 2023-04-25: qty 1

## 2023-04-25 MED ORDER — ICOSAPENT ETHYL 1 G PO CAPS
2.0000 g | ORAL_CAPSULE | Freq: Two times a day (BID) | ORAL | Status: DC
  Administered 2023-04-25: 2 g via ORAL
  Filled 2023-04-25 (×2): qty 2

## 2023-04-25 MED ORDER — PANTOPRAZOLE SODIUM 40 MG PO TBEC
40.0000 mg | DELAYED_RELEASE_TABLET | Freq: Every day | ORAL | Status: DC
  Administered 2023-04-25: 40 mg via ORAL
  Filled 2023-04-25: qty 1

## 2023-04-25 MED ORDER — IOHEXOL 300 MG/ML  SOLN
INTRAMUSCULAR | Status: DC | PRN
  Administered 2023-04-25: 10 mL via URETHRAL

## 2023-04-25 MED ORDER — ONDANSETRON HCL 4 MG PO TABS: 4.0000 mg | ORAL_TABLET | Freq: Three times a day (TID) | ORAL | Status: DC | PRN

## 2023-04-25 MED ORDER — NITROGLYCERIN 0.4 MG SL SUBL: 0.4000 mg | SUBLINGUAL_TABLET | SUBLINGUAL | Status: DC | PRN

## 2023-04-25 MED ORDER — CEFAZOLIN SODIUM 1 G IJ SOLR
INTRAMUSCULAR | Status: AC
  Filled 2023-04-25: qty 20

## 2023-04-25 MED ORDER — ACETAMINOPHEN 10 MG/ML IV SOLN
1000.0000 mg | Freq: Four times a day (QID) | INTRAVENOUS | Status: DC
  Administered 2023-04-25 (×3): 1000 mg via INTRAVENOUS
  Filled 2023-04-25 (×3): qty 100

## 2023-04-25 MED ORDER — SUCCINYLCHOLINE CHLORIDE 200 MG/10ML IV SOSY
PREFILLED_SYRINGE | INTRAVENOUS | Status: DC | PRN
  Administered 2023-04-25: 120 mg via INTRAVENOUS

## 2023-04-25 MED ORDER — EZETIMIBE 10 MG PO TABS
10.0000 mg | ORAL_TABLET | Freq: Every day | ORAL | Status: DC
  Administered 2023-04-25: 10 mg via ORAL
  Filled 2023-04-25: qty 1

## 2023-04-25 MED ORDER — OXYCODONE HCL 5 MG PO TABS: 10.0000 mg | ORAL_TABLET | ORAL | Status: DC | PRN

## 2023-04-25 MED ORDER — IRBESARTAN 300 MG PO TABS
300.0000 mg | ORAL_TABLET | Freq: Every day | ORAL | Status: DC
  Administered 2023-04-25: 300 mg via ORAL
  Filled 2023-04-25: qty 1

## 2023-04-25 MED ORDER — CLONAZEPAM 0.5 MG PO TABS: 0.5000 mg | ORAL_TABLET | Freq: Every day | ORAL | Status: DC

## 2023-04-25 MED ORDER — ONDANSETRON HCL 4 MG/2ML IJ SOLN
INTRAMUSCULAR | Status: DC | PRN
  Administered 2023-04-25: 4 mg via INTRAVENOUS

## 2023-04-25 MED ORDER — FENTANYL CITRATE (PF) 250 MCG/5ML IJ SOLN
INTRAMUSCULAR | Status: DC | PRN
  Administered 2023-04-25: 100 ug via INTRAVENOUS
  Administered 2023-04-25: 50 ug via INTRAVENOUS

## 2023-04-25 MED ORDER — 0.9 % SODIUM CHLORIDE (POUR BTL) OPTIME
TOPICAL | Status: DC | PRN
  Administered 2023-04-25: 1000 mL

## 2023-04-25 MED ORDER — EPHEDRINE 5 MG/ML INJ
INTRAVENOUS | Status: AC
  Filled 2023-04-25: qty 5

## 2023-04-25 MED ORDER — SULFAMETHOXAZOLE-TRIMETHOPRIM 400-80 MG PO TABS: 1.0000 | ORAL_TABLET | Freq: Two times a day (BID) | ORAL | 0 refills | Status: AC

## 2023-04-25 NOTE — Consult Note (Signed)
Cardiology Consultation   Patient ID: ARNEZ KOSTERMAN Sr. MRN: [id number]; DOB: 09-09-1941  Admit date: 04/24/2023 Date of Consult: 04/25/2023  PCP:  [name], MD   [facility] Providers Cardiologist:  Donato Schultz, MD   {  Patient Profile:   Keala Gormley. is a 82 y.o. male with a hx of CAD status post CABG 2010, ascending aortic aneurysm  (44 )with stent graft followed by Dr. Laneta Simmers, STEMI 06/2022, hypertension, hyperlipidemia, paroxysmal atrial fibrillation, moderate TR, DM, who is being seen 04/25/2023 for the evaluation of preoperative evaluation for lithotripsy with infected ureter stone at the request of Dr. Hezzie Bump.  History of Present Illness:   Mr. Rack is cardiac history as mentioned above and followed by Dr. Anne Fu.  Patient has known history of CAD status post CABG in 2010 with abdominal aortic aneurysm stent graft.  Patient has also had STEMI in 07/02/2022 while holding Plavix and Eliquis in preparation for back surgery.  Cardiac catheterization showed patent LIMA to LAD with severe multivessel coronary artery disease with total occlusion of the native LAD, total occlusion of native circumflex extending into OM branch with total occlusion of the mid RCA.  Also had chronic occlusion of the saphenous vein graft to right PLA branch and occlusion of saphenous vein graft to left posterior lateral branch. PCI was attempted, but unsuccessful due to inability to restore TIMI-3 flow. He was loaded with 600 mg of Plavix prior to the procedure and had recommendations to be continued for 3 months; however is still taking chronically.  LVEF reported to be 55 to 60% during that time.  Patient patient also has known history of paroxysmal atrial fibrillation and found to have sinus pauses that lasts around 3 to 4 seconds and therefore was stopped on metoprolol.  Then he was seen by EP we decided to resume low-dose metoprolol.  Patient also seen by hypertension clinic.  He was seen  last in the office April 2024 and able to ambulate 2 miles most days without any anginal symptoms.  Reported to have no chest discomfort, shortness of breath.   Now patient is presenting with obstructing ureteral stone with moderate proximal hydro utero nephrosis  and has plans for outpatient lithotripsy and on abx.  Cardiology has been asked to preoperatively evaluate for recommendations regarding antiplatelet/DOAC.  Patient  Patient reports no impairments in ADLs.  Denies any chest pain since starting his Imdur and rarely uses nitroglycerin, states about 3 times in the past year.  Denies any shortness of breath, peripheral edema, shortness of breath, orthopnea, PND.  Patient is very active and walks 2+ miles each day with his new dog.  Reports compliancy with all medications.   Past Medical History:  Diagnosis Date   AAA (abdominal aortic aneurysm) (HCC)    Arthritis    knees   Atrial fibrillation (HCC)    CAD (coronary artery disease)    Cancer (HCC)    prostate   CKD (chronic kidney disease), stage III (HCC) 06/18/2022   COVID    06/2019 and in 2022 states they were mild cases   Diabetes mellitus without complication (HCC)    Dyspnea    once in a while   Dysrhythmia    A-fib pt is on Eliquis   GERD (gastroesophageal reflux disease)    Gout    History of kidney stones    Hyperlipidemia    Hypertension    Hypothyroidism    Myocardial infarction (HCC) 01/1991   sp  inferior   Nerve compression    right leg   Reflux    Seasonal allergies    Thoracic ascending aortic aneurysm (HCC)    Thyroid disease    hypothyroidism    Past Surgical History:  Procedure Laterality Date   ABDOMINAL AORTAGRAM N/A 02/28/2012   Procedure: ABDOMINAL Ronny Flurry;  Surgeon: Nada Libman, MD;  Location: [facility] CATH LAB;  Service: Cardiovascular;  Laterality: N/A;   abdominal aortagram embolization  02/28/2012   ABDOMINAL AORTIC ANEURYSM REPAIR  07/14/2012   CARDIAC CATHETERIZATION     CATARACT  EXTRACTION Bilateral 2016   CORONARY ARTERY BYPASS GRAFT  04/13/2009   CORONARY BALLOON ANGIOPLASTY N/A 06/15/2022   Procedure: CORONARY BALLOON ANGIOPLASTY;  Surgeon: Tonny Bollman, MD;  Location: [facility] INVASIVE CV LAB;  Service: Cardiovascular;  Laterality: N/A;   CORONARY STENT PLACEMENT  1992 and  2009   RCA   EMBOLIZATION Right 02/28/2012   Procedure: EMBOLIZATION;  Surgeon: Nada Libman, MD;  Location: [facility] CATH LAB;  Service: Cardiovascular;  Laterality: Right;   IR GENERIC HISTORICAL  12/05/2016   IR RADIOLOGIST EVAL & MGMT 12/05/2016 Malachy Moan, MD [facility] INTERV RAD   IR RADIOLOGIST EVAL & MGMT  07/10/2019   IR RADIOLOGIST EVAL & MGMT  08/25/2020   IR RADIOLOGIST EVAL & MGMT  08/16/2021   IR RADIOLOGIST EVAL & MGMT  08/31/2022   KIDNEY STONE SURGERY     LAMINECTOMY WITH POSTERIOR LATERAL ARTHRODESIS LEVEL 1 Bilateral 11/10/2022   Procedure: Lumbar Two-Lumbar Three laminectomy  - bilateral with posterior lateral fusion and interspinous plating;  Surgeon: Tia Alert, MD;  Location: [facility] OR;  Service: Neurosurgery;  Laterality: Bilateral;   LEFT HEART CATH AND CORONARY ANGIOGRAPHY N/A 06/15/2022   Procedure: LEFT HEART CATH AND CORONARY ANGIOGRAPHY;  Surgeon: Tonny Bollman, MD;  Location: [facility] INVASIVE CV LAB;  Service: Cardiovascular;  Laterality: N/A;   LEFT HEART CATHETERIZATION WITH CORONARY/GRAFT ANGIOGRAM N/A 01/08/2015   Procedure: LEFT HEART CATHETERIZATION WITH Isabel Caprice;  Surgeon: Othella Boyer, MD;  Location: [facility] CATH LAB;  Service: Cardiovascular;  Laterality: N/A;   parathyroid adenoma  1981   surgery   PROSTATE SURGERY     rad seeds   SPINE SURGERY  2006   TONSILLECTOMY  1955    Inpatient Medications: Scheduled Meds:  amLODipine  10 mg Oral Daily   apixaban  5 mg Oral BID   carvedilol  6.25 mg Oral BID WC   clonazePAM  0.5 mg Oral QHS   clopidogrel  75 mg Oral Daily   ezetimibe  10 mg Oral Daily   icosapent Ethyl  2 g Oral BID    insulin aspart  0-15 Units Subcutaneous Q4H   irbesartan  300 mg Oral Daily   isosorbide mononitrate  30 mg Oral Daily   levothyroxine  175 mcg Oral Q0600   magnesium oxide  400 mg Oral Daily   pantoprazole  40 mg Oral Daily   sulfamethoxazole-trimethoprim  1 tablet Oral Q12H   tamsulosin  0.4 mg Oral QODAY   Continuous Infusions:  acetaminophen 1,000 mg (04/25/23 0820)   cefTRIAXone (ROCEPHIN)  IV Stopped (04/24/23 1759)   lactated ringers 100 mL/hr at 04/25/23 0334   PRN Meds: HYDROmorphone (DILAUDID) injection, nitroGLYCERIN, ondansetron, oxyCODONE  Allergies:    Allergies  Allergen Reactions   Ace Inhibitors Cough   Crestor [Rosuvastatin] Other (See Comments)    Muscle aches   Nitroglycerin Other (See Comments)    Very pronounced lowered BP with IV nitro  for caths   Quinolones Other (See Comments)    Aortic root enlargement    Social History:   Social History   Socioeconomic History   Marital status: Divorced    Spouse name: Not on file   Number of children: Not on file   Years of education: 14   Highest education level: Associate degree: academic program  Occupational History   Occupation: Retired  Tobacco Use   Smoking status: Former    Types: Cigarettes    Quit date: 08/01/1970    Years since quitting: 52.7   Smokeless tobacco: Never  Vaping Use   Vaping Use: Never used  Substance and Sexual Activity   Alcohol use: Yes    Alcohol/week: 6.0 standard drinks of alcohol    Types: 3 Glasses of wine, 3 Cans of beer per week    Comment: 1-3 glasses 3-4 times a week   Drug use: No   Sexual activity: Not Currently  Other Topics Concern   Not on file  Social History Narrative   Not on file   Social Determinants of Health   Financial Resource Strain: Not on file  Food Insecurity: No Food Insecurity (04/25/2023)   Hunger Vital Sign    Worried About Running Out of Food in the Last Year: Never true    Ran Out of Food in the Last Year: Never true   Transportation Needs: No Transportation Needs (04/25/2023)   PRAPARE - Administrator, Civil Service (Medical): No    Lack of Transportation (Non-Medical): No  Physical Activity: Not on file  Stress: Not on file  Social Connections: Not on file  Intimate Partner Violence: Not At Risk (04/25/2023)   Humiliation, Afraid, Rape, and Kick questionnaire    Fear of Current or Ex-Partner: No    Emotionally Abused: No    Physically Abused: No    Sexually Abused: No    Family History:   Family History  Problem Relation Age of Onset   Heart disease Father        Heart Disease before age 4   Heart attack Father    Hyperlipidemia Father    Hypertension Father    Stroke Mother    Deep vein thrombosis Mother    Heart disease Sister    Diabetes Sister    Hyperlipidemia Sister    Hypertension Sister    Heart attack Sister    Heart disease Paternal Uncle    Diabetes Maternal Grandmother    Diabetes Sister      ROS:  Please see the history of present illness.  All other ROS reviewed and negative.     Physical Exam/Data:   Vitals:   04/25/23 0145 04/25/23 0204 04/25/23 0401 04/25/23 0821  BP: (!) 132/91 126/83 134/81 138/83  Pulse: 64 69 61 68  Resp: 13 16 17 17   Temp: 98 F (36.7 C) 97.6 F (36.4 C) 97.6 F (36.4 C) (!) 97.5 F (36.4 C)  TempSrc:  Oral Oral Oral  SpO2: 92% 96% 98% 91%  Weight:   84.4 kg   Height:        Intake/Output Summary (Last 24 hours) at 04/25/2023 1253 Last data filed at 04/25/2023 1202 Gross per 24 hour  Intake 3000 ml  Output 1400 ml  Net 1600 ml       04/25/2023    4:01 AM 04/24/2023    4:01 PM 03/22/2023    7:30 AM  Last 3 Weights  Weight (lbs) 186 lb 270 lb  194 lb  Weight (kg) 84.369 kg 122.471 kg 87.998 kg     Body mass index is 27.47 kg/m.  General:  Well nourished, well developed, in no acute distress HEENT: normal Neck: no JVD Vascular: No carotid bruits; Distal pulses 2+ bilaterally Cardiac:  normal S1, S2; RRR; +  murmur  Lungs:  clear to auscultation bilaterally, no wheezing, rhonchi or rales  Abd: soft, nontender, no hepatomegaly  Ext: no edema Musculoskeletal:  No deformities, BUE and BLE strength normal and equal Skin: warm and dry  Neuro:  CNs 2-12 intact, no focal abnormalities noted Psych:  Normal affect   EKG:  The EKG was personally reviewed and demonstrates:  Atrial fibrillation, heart rate 86.  Right bundle branch block.  No acute ST-T wave changes.  Mild ST depressions in lead II. No significant changes from prior readings Telemetry:  Telemetry was personally reviewed and demonstrates:  Normal sinus rhythm heart rate 60-80.    Relevant CV Studies: Echocardiogram 06/15/2022 1. Left ventricular ejection fraction, by estimation, is 55 to 60%. The  left ventricle has normal function. The left ventricle demonstrates global  hypokinesis. There is mild concentric left ventricular hypertrophy. Left  ventricular diastolic parameters  are indeterminate.   2. Right ventricular systolic function is mildly reduced. The right  ventricular size is normal.   3. Left atrial size was moderately dilated.   4. Right atrial size was moderately dilated.   5. The mitral valve is normal in structure. No evidence of mitral valve  regurgitation. No evidence of mitral stenosis.   6. Tricuspid valve regurgitation is moderate.   7. The aortic valve is normal in structure. Aortic valve regurgitation is  not visualized. No aortic stenosis is present.   8. Aneurysm of the ascending aorta, measuring 44 mm. There is mild  dilatation of the aortic root, measuring 39 mm.   9. The inferior vena cava is normal in size with greater than 50%  respiratory variability, suggesting right atrial pressure of 3 mmHg.   Left heart catheterization 06/15/2022 1.  Severe multivessel coronary artery disease with total occlusion of the native LAD, total occlusion of the native circumflex beyond the first OM branch, and subtotal  occlusion of the mid RCA 2.  Status post CABG with continued patency of the LIMA to LAD, saphenous vein graft to acute marginal, and saphenous vein graft to PDA with extensive diffuse degenerative changes. 3.  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  Acute occlusion of the saphenous vein graft to left posterolateral branch with extensive degenerative changes and unsuccessful PCI due to inability to restore TIMI-3 flow in an extensively degenerated graft 5.  Normal LVEDP   Plan: Patient loaded with clopidogrel 600 mg prior to the procedure, would continue at least 3 months and he will need to delay his planned back surgery for this duration.  Resume apixaban tomorrow morning.  Check 2D echocardiogram.  Post MI CV-ICU care, anticipate hospital discharge in 48 hours if no complications arise.    Laboratory Data:  High Sensitivity Troponin:  No results for input(s): "TROPONINIHS" in the last 720 hours.   Chemistry Recent Labs  Lab 04/24/23 1613 04/24/23 1620 04/25/23 0305  NA 133* 134* 134*  K 4.8 4.7 4.4  CL 99 102 103  CO2 19*  --  20*  GLUCOSE 148* 141* 135*  BUN 27* 31* 28*  CREATININE 2.29* 2.20* 1.88*  CALCIUM 10.0  --  9.2  GFRNONAA 28*  --  36*  ANIONGAP 15  --  11     Recent Labs  Lab 04/24/23 1613  PROT 7.0  ALBUMIN 3.8  AST 20  ALT 16  ALKPHOS 79  BILITOT 2.1*    Lipids No results for input(s): "CHOL", "TRIG", "HDL", "LABVLDL", "LDLCALC", "CHOLHDL" in the last 168 hours.  Hematology Recent Labs  Lab 04/24/23 1613 04/24/23 1620 04/25/23 0305  WBC 3.5*  --  6.9  RBC 5.09  --  4.23  HGB 15.0 15.6 12.4*  HCT 44.8 46.0 37.9*  MCV 88.0  --  89.6  MCH 29.5  --  29.3  MCHC 33.5  --  32.7  RDW 13.7  --  14.0  PLT 146*  --  110*    Thyroid No results for input(s): "TSH", "FREET4" in the last 168 hours.  BNPNo results for input(s): "BNP", "PROBNP" in the last 168 hours.  DDimer No results for input(s): "DDIMER" in the last 168  hours.   Radiology/Studies:  DG C-Arm 1-60 Min  Result Date: 04/25/2023 CLINICAL DATA:  Retrograde urography, intraoperative examination EXAM: DG C-ARM 1-60 MIN CONTRAST:  Not listed, refer to operative report. FLUOROSCOPY: Fluoroscopy Time:  24 seconds Radiation Exposure Index (if provided by the fluoroscopic device): 4.42 mGy Number of Acquired Spot Images: 3 COMPARISON:  CT 04/24/2023 FINDINGS: Initial image demonstrates retrograde urography with a few small angular filling defects within the distal right ureter likely representing residual debris. The 6 mm calculus noted on prior CT examination is not clearly identified, likely removed in the interval. No hydroureter. Subsequent images demonstrate contrast opacification of the right pelvicaliceal system and deployment of a ureteral stent extending from the upper pole calices to the bladder. No definite extraluminal extension of contrast. IMPRESSION: 1. Retrograde urography with right ureteral stent placement. No definite extraluminal extension of contrast. Electronically Signed   By: Helyn Numbers M.D.   On: 04/25/2023 02:22   CT Renal Stone Study  Result Date: 04/24/2023 CLINICAL DATA:  Abdominal/flank pain, stone suspected EXAM: CT ABDOMEN AND PELVIS WITHOUT CONTRAST TECHNIQUE: Multidetector CT imaging of the abdomen and pelvis was performed following the standard protocol without IV contrast. RADIATION DOSE REDUCTION: This exam was performed according to the departmental dose-optimization program which includes automated exposure control, adjustment of the mA and/or kV according to patient size and/or use of iterative reconstruction technique. COMPARISON:  CTA 08/22/2022 FINDINGS: Lower chest: Mild dependent atelectasis. Trace right pleural effusion. Coronary artery calcifications. Mild cardiomegaly. Hepatobiliary: Diffuse hepatic steatosis. No evidence of focal liver lesion. Multiple layering stones in the gallbladder. No pericholecystic  inflammation. No biliary dilatation. Pancreas: Parenchymal atrophy. No ductal dilatation or inflammation. Spleen: Mild splenomegaly, spleen measures 13.5 cm cranial caudal. Adrenals/Urinary Tract: No adrenal nodule. 8 x 8 mm stone in the distal right ureter with moderate proximal hydroureteronephrosis. Mild right perinephric edema. Punctate nonobstructing stone in the upper pole of the right kidney. Small right renal cyst, needs no further imaging follow-up. Intrarenal stones in the left kidney without left hydronephrosis. No left ureteral calculi. The urinary bladder is partially distended, no bladder stone. Stomach/Bowel: Minimal hiatal hernia. The stomach is nondistended there is no bowel obstruction or inflammation. Moderate volume of colonic stool. Sigmoid colonic redundancy. There is chronic fullness of the proximal appendix at 7 mm which tapers distally, no appendicitis. 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 The aneurysm sac measures 6.6 x 4.7 cm, previously 6.4 x 4.5 cm. Embolization coils within the aneurysm sac. There is no periaortic stranding. Coiling  of the right internal iliac artery. 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 No abdominopelvic adenopathy. 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 Other: No ascites or free air. Small fat containing umbilical hernia. Musculoskeletal: Posterior rod with intrapedicular screws L2-L3. The right L3 pedicle screw extends lateral to the vertebral body cortex. Additional degenerative change in the lower lumbar spine. No acute osseous abnormalities. IMPRESSION: 1. Obstructing 8 x 8 mm stone in the distal right ureter with moderate proximal hydroureteronephrosis. 2. Additional nonobstructing stones in both kidneys. 3. 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 The aneurysm sac measures 6.6 x 4.7 cm, previously 6.4 x 4.5 cm. Recommend annual imaging follow-up by CTA or MRA. 4. Hepatic  steatosis.  Mild splenomegaly. 5. Cholelithiasis without CT findings of acute cholecystitis. 6. Trace right pleural effusion. Aortic Atherosclerosis (ICD10-I70.0). Electronically Signed   By: Narda Rutherford M.D.   On: 04/24/2023 19:14   DG Chest 2 View  Result Date: 04/24/2023 CLINICAL DATA:  Shortness of breath. EXAM: CHEST - 2 VIEW COMPARISON:  August 09, 2022 FINDINGS: Multiple sternal wires and vascular clips are seen. The heart size and mediastinal contours are within normal limits. There is mild calcification of the aortic arch. Both lungs are clear. The visualized skeletal structures are unremarkable. IMPRESSION: 1. Evidence of prior median sternotomy/CABG. 2. No active cardiopulmonary disease. Electronically Signed   By: Aram Candela M.D.   On: 04/24/2023 17:39     Assessment and Plan:   The preoperative evaluation for ureteral stone lithotripsy CAD status post CABG 2019, STEMI 06/15/2022 Cardiology primarily consulted for recommendations regarding regiment for antiplatelet/DOAC.  Previous history of STEMI with discontinuation of blood thinners prior to back surgery in 2023.  Patient has known history of coronary artery disease status post CABG along with me in 2023.  Overall appears to have stable CAD without any significant anginal complaints or interference with ADLs.  Patient is very active and can complete at least 4 METS.  Patient has overall class II risk with 6% of MACE based off RCRI.  Given minor procedure, but extensive CAD/comorbidities, considered to be mild-moderate risk.  Will discuss with MD about plans for blood thinners and timing of doses. Continue Imdur 30 mg daily, Zetia 10 mg, atorvastatin 40 mg daily, carvedilol 6.25 mg BID  Paroxysmal atrial fibrillation Currently maintaining normal sinus rhythm with heart rates between 60-80.  However EKG showing atrial fibrillation.  Infrequently has heart palpitations and not limited in ADLs. Continue carvedilol 6.25 mg twice  daily.  Will discuss with MD about plans and duration for Eliquis.  Hypertension Continue amlodipine 10 mg daily, irbesartan 300 mg daily, carvedilol 6.25 mg twice daily.  Followed by hypertension clinic.  Hyperlipidemia See above.  AAA with EVAR (44mm) Ascending aortic aneurysm Followed by Dr. Laneta Simmers and IR .  Continue to monitor outpatient.  Moderate TR Continue to monitor outpatient.    Risk Assessment/Risk Scores:    CHA2DS2-VASc Score = 5   This indicates a 7.2% annual risk of stroke. The patient's score is based upon: CHF History: 0 HTN History: 1 Diabetes History: 1 Stroke History: 0 Vascular Disease History: 1 Age Score: 2 Gender Score: 0    For questions or updates, please contact [facility] Please consult [email or web address] for contact info under    Signed, Abagail Kitchens, PA-C  04/25/2023 12:53 PM

## 2023-04-25 NOTE — Progress Notes (Signed)
PHARMACY NOTE:  ANTIMICROBIAL RENAL DOSAGE ADJUSTMENT  Current antimicrobial regimen includes a mismatch between antimicrobial dosage and estimated renal function.  As per policy approved by the Pharmacy & Therapeutics and Medical Executive Committees, the antimicrobial dosage will be adjusted accordingly.  Current antimicrobial dosage:  Bactrim DS Q12hr  Indication: ureteral stone   Renal Function:  Estimated Creatinine Clearance: 31.3 mL/min (A) (by C-G formula based on SCr of 1.88 mg/dL (H)).     Antimicrobial dosage has been changed to:  Bactrim SS Q12hr   Additional comments: Baseline Scr is labile 1.6- 2.2 and right around ClCr cutoff for adjustment   Thank you for allowing pharmacy to be a part of this patient's care.  Alphia Moh, PharmD, BCPS, BCCP Clinical Pharmacist  Please check AMION for all [facility] Pharm[facility] phone numbers After 10:00 PM, call Main Pharmacy [phone]

## 2023-04-25 NOTE — Progress Notes (Signed)
1 Day Post-Op Subjective: Patient is an 82 year old male seen in consult for 7 mm right distal ureteral stone.  First time meeting patient this morning.  He was alert, oriented, and had no specific urologic complaints.  Reviewed next steps for laser lithotripsy and outpatient follow-up.  All questions were answered to his satisfaction.  Objective: Vital signs in last 24 hours: Temp:  [97.5 F (36.4 C)-100.8 F (38.2 C)] 97.5 F (36.4 C) (06/12 0821) Pulse Rate:  [61-117] 68 (06/12 0821) Resp:  [13-24] 17 (06/12 0821) BP: (94-158)/(65-99) 138/83 (06/12 0821) SpO2:  [90 %-98 %] 91 % (06/12 0821) Weight:  [84.4 kg-122.5 kg] 84.4 kg (06/12 0401)  Intake/Output from previous day: 06/11 0701 - 06/12 0700 In: 3000 [I.V.:1000; IV Piggyback:2000] Out: 850 [Urine:850]  Intake/Output this shift: No intake/output data recorded.  Physical Exam:  General: Alert and oriented CV: No cyanosis Lungs: equal chest rise Abdomen: Soft, NTND, no rebound or guarding   Lab Results: Recent Labs    04/24/23 1613 04/24/23 1620 04/25/23 0305  HGB 15.0 15.6 12.4*  HCT 44.8 46.0 37.9*   BMET Recent Labs    04/24/23 1613 04/24/23 1620 04/25/23 0305  NA 133* 134* 134*  K 4.8 4.7 4.4  CL 99 102 103  CO2 19*  --  20*  GLUCOSE 148* 141* 135*  BUN 27* 31* 28*  CREATININE 2.29* 2.20* 1.88*  CALCIUM 10.0  --  9.2     Studies/Results: DG C-Arm 1-60 Min  Result Date: 04/25/2023 CLINICAL DATA:  Retrograde urography, intraoperative examination EXAM: DG C-ARM 1-60 MIN CONTRAST:  Not listed, refer to operative report. FLUOROSCOPY: Fluoroscopy Time:  24 seconds Radiation Exposure Index (if provided by the fluoroscopic device): 4.42 mGy Number of Acquired Spot Images: 3 COMPARISON:  CT 04/24/2023 FINDINGS: Initial image demonstrates retrograde urography with a few small angular filling defects within the distal right ureter likely representing residual debris. The 6 mm calculus noted on prior CT  examination is not clearly identified, likely removed in the interval. No hydroureter. Subsequent images demonstrate contrast opacification of the right pelvicaliceal system and deployment of a ureteral stent extending from the upper pole calices to the bladder. No definite extraluminal extension of contrast. IMPRESSION: 1. Retrograde urography with right ureteral stent placement. No definite extraluminal extension of contrast. Electronically Signed   By: Helyn Numbers M.D.   On: 04/25/2023 02:22   CT Renal Stone Study  Result Date: 04/24/2023 CLINICAL DATA:  Abdominal/flank pain, stone suspected EXAM: CT ABDOMEN AND PELVIS WITHOUT CONTRAST TECHNIQUE: Multidetector CT imaging of the abdomen and pelvis was performed following the standard protocol without IV contrast. RADIATION DOSE REDUCTION: This exam was performed according to the departmental dose-optimization program which includes automated exposure control, adjustment of the mA and/or kV according to patient size and/or use of iterative reconstruction technique. COMPARISON:  CTA 08/22/2022 FINDINGS: Lower chest: Mild dependent atelectasis. Trace right pleural effusion. Coronary artery calcifications. Mild cardiomegaly. Hepatobiliary: Diffuse hepatic steatosis. No evidence of focal liver lesion. Multiple layering stones in the gallbladder. No pericholecystic inflammation. No biliary dilatation. Pancreas: Parenchymal atrophy. No ductal dilatation or inflammation. Spleen: Mild splenomegaly, spleen measures 13.5 cm cranial caudal. Adrenals/Urinary Tract: No adrenal nodule. 8 x 8 mm stone in the distal right ureter with moderate proximal hydroureteronephrosis. Mild right perinephric edema. Punctate nonobstructing stone in the upper pole of the right kidney. Small right renal cyst, needs no further imaging follow-up. Intrarenal stones in the left kidney without left hydronephrosis. No left ureteral calculi. The urinary  bladder is partially distended, no bladder  stone. Stomach/Bowel: Minimal hiatal hernia. The stomach is nondistended there is no bowel obstruction or inflammation. Moderate volume of colonic stool. Sigmoid colonic redundancy. There is chronic fullness of the proximal appendix at 7 mm which tapers distally, no appendicitis. 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 The aneurysm sac measures 6.6 x 4.7 cm, previously 6.4 x 4.5 cm. Embolization coils within the aneurysm sac. There is no periaortic stranding. Coiling of the right internal iliac artery. 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 No abdominopelvic adenopathy. 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 Other: No ascites or free air. Small fat containing umbilical hernia. Musculoskeletal: Posterior rod with intrapedicular screws L2-L3. The right L3 pedicle screw extends lateral to the vertebral body cortex. Additional degenerative change in the lower lumbar spine. No acute osseous abnormalities. IMPRESSION: 1. Obstructing 8 x 8 mm stone in the distal right ureter with moderate proximal hydroureteronephrosis. 2. Additional nonobstructing stones in both kidneys. 3. 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 The aneurysm sac measures 6.6 x 4.7 cm, previously 6.4 x 4.5 cm. Recommend annual imaging follow-up by CTA or MRA. 4. Hepatic steatosis.  Mild splenomegaly. 5. Cholelithiasis without CT findings of acute cholecystitis. 6. Trace right pleural effusion. Aortic Atherosclerosis (ICD10-I70.0). Electronically Signed   By: Narda Rutherford M.D.   On: 04/24/2023 19:14   DG Chest 2 View  Result Date: 04/24/2023 CLINICAL DATA:  Shortness of breath. EXAM: CHEST - 2 VIEW COMPARISON:  August 09, 2022 FINDINGS: Multiple sternal wires and vascular clips are seen. The heart size and mediastinal contours are within normal limits. There is mild calcification of the aortic arch. Both lungs are clear. The visualized skeletal structures are  unremarkable. IMPRESSION: 1. Evidence of prior median sternotomy/CABG. 2. No active cardiopulmonary disease. Electronically Signed   By: Aram Candela M.D.   On: 04/24/2023 17:39    Assessment/Plan: # Ureteral stone #AKI  S/p right ureteral send placement.  Patient reports that he has some transient light hematuria. 2 weeks of oral ABX following stent placement, then may proceed with definitive stone management on an outpatient basis.  Reviewed laser lithotripsy with patient. AKI 2/2 obstructive uropathy.  Some interval improvement in serum creatinine from 2.29 on admission to 1.88 today.  Continue to follow daily labs Patient will follow-up closely with [facility] and is already established with our practice.  His surgeon Dr. Retta Diones is retiring and he will establish with first available.   LOS: 1 day   Elmon Kirschner, NP [facility] Pager: [phone]  04/25/2023, 10:28 AM

## 2023-04-25 NOTE — Discharge Instructions (Signed)
DISCHARGE INSTRUCTIONS FOR KIDNEY STONE/URETERAL STENT   MEDICATIONS:  1.  Resume all your other meds from home - except do not take any extra narcotic pain meds that you may have at home.  2.  Take antibiotics as prescribed.  ACTIVITY:  1. No strenuous activity x 1week  2. No driving while on narcotic pain medications  3. Drink plenty of water  4. Continue to walk at home - you can still get blood clots when you are at home, so keep active, but don't over do it.  5. May return to work/school tomorrow or when you feel ready   BATHING:  1. You can shower and we recommend daily showers   SIGNS/SYMPTOMS TO CALL:  Please call us if you have a fever greater than 101.5, uncontrolled nausea/vomiting, uncontrolled pain, dizziness, unable to urinate, bloody urine, chest pain, shortness of breath, leg swelling, leg pain, redness around wound, drainage from wound, or any other concerns or questions.   You can reach [name] at [phone].   FOLLOW-UP:  1. You will be scheduled for ureteroscopy in the coming days.

## 2023-04-25 NOTE — Op Note (Signed)
Preoperative diagnosis:  Infected right ureteral stone   Postoperative diagnosis:  same   Procedure:  Cystoscopy right ureteral stent placement right retrograde pyelography with interpretation   Surgeon: Crist Fat, MD  Anesthesia: General  Complications: None  Intraoperative findings:   #1 right retrograde pyelography demonstrated a filling defect within the right ureter consistent with the patient's known calculus without other abnormalities. #2 26cm right ureteral stent placed  EBL: Minimal  Specimens: None  Indication: Garrett BAYLE Sr. is a 82 y.o. patient with fevers/chills/dysuria and obstructing right ureteral stone. After reviewing the management options for treatment, he elected to proceed with the above surgical procedure(s). We have discussed the potential benefits and risks of the procedure, side effects of the proposed treatment, the likelihood of the patient achieving the goals of the procedure, and any potential problems that might occur during the procedure or recuperation. Informed consent has been obtained.  Description of procedure:  The patient was taken to the operating room and general anesthesia was induced.  The patient was placed in the dorsal lithotomy position, prepped and draped in the usual sterile fashion, and preoperative antibiotics were administered. A preoperative time-out was performed.   Cystourethroscopy was performed.  The patient's urethra was examined and was normal demonstrated mild prostatic enlargement with blanched mucosa consistent with radiation. The bladder was then systematically examined in its entirety. There was no evidence for any bladder tumors, stones, or other mucosal pathology.    Attention then turned to the rightureteral orifice and a ureteral catheter was used to intubate the ureteral orifice.  Omnipaque contrast was injected through the ureteral catheter and a retrograde pyelogram was performed with findings as  dictated above.  A 0.38 sensor guidewire was then advanced up the right ureter into the renal pelvis under fluoroscopic guidance.  The wire was then backloaded through the cystoscope and a ureteral stent was advance over the wire using Seldinger technique.  The stent was positioned appropriately under fluoroscopic and cystoscopic guidance.  The wire was then removed with an adequate stent curl noted in the renal pelvis as well as in the bladder.  The bladder was then emptied and the procedure ended.  The patient appeared to tolerate the procedure well and without complications.  The patient was able to be awakened and transferred to the recovery unit in satisfactory condition.    Crist Fat, M.D.

## 2023-04-25 NOTE — Consult Note (Deleted)
Duplicate

## 2023-04-25 NOTE — Transfer of Care (Signed)
Immediate Anesthesia Transfer of Care Note  Patient: Garrett CHAMPNEY Sr.  Procedure(s) Performed: CYSTOSCOPY WITH RETROGRADE PYELOGRAM/URETERAL STENT PLACEMENT (Right)  Patient Location: PACU  Anesthesia Type:General  Level of Consciousness: awake  Airway & Oxygen Therapy: Patient Spontanous Breathing  Post-op Assessment: Report given to RN and Post -op Vital signs reviewed and stable  Post vital signs: Reviewed and stable  Last Vitals:  Vitals Value Taken Time  BP 125/85 04/25/23 0125  Temp    Pulse 69 04/25/23 0129  Resp 16 04/25/23 0129  SpO2 94 % 04/25/23 0129  Vitals shown include unvalidated device data.  Last Pain:  Vitals:   04/24/23 1948  TempSrc: Oral  PainSc:          Complications: No notable events documented.

## 2023-04-25 NOTE — Anesthesia Preprocedure Evaluation (Signed)
Anesthesia Evaluation  Patient identified by MRN, date of birth, ID band Patient awake    Reviewed: Allergy & Precautions, H&P , NPO status , Patient's Chart, lab work & pertinent test results, reviewed documented beta blocker date and time   Airway Mallampati: II  TM Distance: >3 FB Neck ROM: Full    Dental no notable dental hx. (+) Teeth Intact, Dental Advisory Given   Pulmonary neg pulmonary ROS, former smoker   Pulmonary exam normal breath sounds clear to auscultation       Cardiovascular hypertension, Pt. on medications and Pt. on home beta blockers + CAD, + Past MI, + Cardiac Stents, + CABG and + Peripheral Vascular Disease   Rhythm:Regular Rate:Normal     Neuro/Psych negative neurological ROS  negative psych ROS   GI/Hepatic Neg liver ROS,GERD  ,,  Endo/Other  diabetes, Type 2, Oral Hypoglycemic AgentsHypothyroidism    Renal/GU Renal InsufficiencyRenal disease  negative genitourinary   Musculoskeletal  (+) Arthritis , Osteoarthritis,    Abdominal   Peds  Hematology negative hematology ROS (+)   Anesthesia Other Findings   Reproductive/Obstetrics negative OB ROS                             Anesthesia Physical Anesthesia Plan  ASA: 3 and emergent  Anesthesia Plan: General   Post-op Pain Management: Minimal or no pain anticipated   Induction: Intravenous  PONV Risk Score and Plan: 3 and Ondansetron, Dexamethasone and Treatment may vary due to age or medical condition  Airway Management Planned: LMA  Additional Equipment:   Intra-op Plan:   Post-operative Plan: Extubation in OR  Informed Consent: I have reviewed the patients History and Physical, chart, labs and discussed the procedure including the risks, benefits and alternatives for the proposed anesthesia with the patient or authorized representative who has indicated his/her understanding and acceptance.     Dental  advisory given  Plan Discussed with: CRNA  Anesthesia Plan Comments:        Anesthesia Quick Evaluation

## 2023-04-25 NOTE — Care Management CC44 (Signed)
Condition Code 44 Documentation Completed  Patient Details  Name: Garrett CASEBOLT Sr. MRN: [id number] Date of Birth: 14-Aug-1941   Condition Code 44 given:  Yes Patient signature on Condition Code 44 notice:  Yes Documentation of 2 MD's agreement:  Yes Code 44 added to claim:  Yes    Lawerance Sabal, RN 04/25/2023, 12:01 PM

## 2023-04-25 NOTE — H&P (Addendum)
History and Physical    Patient: Garrett Jordan:[id number] DOB: 1941/04/06 DOA: 04/24/2023 DOS: the patient was seen and examined on 04/25/2023 PCP: [name], MD  Patient coming from: Home  Chief Complaint:  Chief Complaint  Patient presents with   Flank Pain   Nausea   Fever   HPI: Garrett CONSER Sr. is a 82 y.o. male with medical history significant of hypertension, CAD s/p CABG, diabetes mellitus type 2, hypothyroidism, nephrolithiasis, AAA, gout, GERD who presented with complaints of right flank pain.  Patient had right flank painintermittently over the last week along with dysuria, but within the 2 days ago symptoms became severe.   Denied having any nausea or vomiting.  He gone to a walk-in clinic at first and was prescribed some medication, but reports having significant fever and chills after taking 1 dose of the medication for which he came to the hospital for further evaluation.  In the emergency department patient has been noted to be febrile up to 100.8 F with tachycardia and tachypnea meeting SIRS criteria.  Labs from 6/11 significant for WBC 5.3, platelets 146, sodium 133, BUN 27, creatinine 2.29, and lactic acid 3.4.  Renal CT significant for obstructing 8 x 8 mm stone in the distal right ureter with moderate proximal hydroureteronephrosis.  Urinalysis was positive for large hemoglobin, rare bacteria, 21-50 RBC/hpf and greater than 50 WBCs.  Blood and urine cultures have been obtained.  Patient had been started on empiric antibiotics of Rocephin initially.  Dr. Marlou Porch of urology had been consulted and took patient for cystoscopy and stent placement.  Patient tolerated procedure well and was switched to Rocephin.  Cardiology had been consulted for cardiac clearance for need of lithotripsy.  Review of Systems: As mentioned in the history of present illness. All other systems reviewed and are negative. Past Medical History:  Diagnosis Date   AAA (abdominal aortic  aneurysm) (HCC)    Arthritis    knees   Atrial fibrillation (HCC)    CAD (coronary artery disease)    Cancer (HCC)    prostate   CKD (chronic kidney disease), stage III (HCC) 06/18/2022   COVID    06/2019 and in 2022 states they were mild cases   Diabetes mellitus without complication (HCC)    Dyspnea    once in a while   Dysrhythmia    A-fib pt is on Eliquis   GERD (gastroesophageal reflux disease)    Gout    History of kidney stones    Hyperlipidemia    Hypertension    Hypothyroidism    Myocardial infarction (HCC) 01/1991   sp inferior   Nerve compression    right leg   Reflux    Seasonal allergies    Thoracic ascending aortic aneurysm (HCC)    Thyroid disease    hypothyroidism   Past Surgical History:  Procedure Laterality Date   ABDOMINAL AORTAGRAM N/A 02/28/2012   Procedure: ABDOMINAL Ronny Flurry;  Surgeon: Nada Libman, MD;  Location: [facility] CATH LAB;  Service: Cardiovascular;  Laterality: N/A;   abdominal aortagram embolization  02/28/2012   ABDOMINAL AORTIC ANEURYSM REPAIR  07/14/2012   CARDIAC CATHETERIZATION     CATARACT EXTRACTION Bilateral 2016   CORONARY ARTERY BYPASS GRAFT  04/13/2009   CORONARY BALLOON ANGIOPLASTY N/A 06/15/2022   Procedure: CORONARY BALLOON ANGIOPLASTY;  Surgeon: Tonny Bollman, MD;  Location: [facility] INVASIVE CV LAB;  Service: Cardiovascular;  Laterality: N/A;   CORONARY STENT PLACEMENT  1992 and  2009  RCA   EMBOLIZATION Right 02/28/2012   Procedure: EMBOLIZATION;  Surgeon: Nada Libman, MD;  Location: [facility] CATH LAB;  Service: Cardiovascular;  Laterality: Right;   IR GENERIC HISTORICAL  12/05/2016   IR RADIOLOGIST EVAL & MGMT 12/05/2016 Malachy Moan, MD [facility] INTERV RAD   IR RADIOLOGIST EVAL & MGMT  07/10/2019   IR RADIOLOGIST EVAL & MGMT  08/25/2020   IR RADIOLOGIST EVAL & MGMT  08/16/2021   IR RADIOLOGIST EVAL & MGMT  08/31/2022   KIDNEY STONE SURGERY     LAMINECTOMY WITH POSTERIOR LATERAL ARTHRODESIS LEVEL 1 Bilateral 11/10/2022    Procedure: Lumbar Two-Lumbar Three laminectomy  - bilateral with posterior lateral fusion and interspinous plating;  Surgeon: Tia Alert, MD;  Location: [facility] OR;  Service: Neurosurgery;  Laterality: Bilateral;   LEFT HEART CATH AND CORONARY ANGIOGRAPHY N/A 06/15/2022   Procedure: LEFT HEART CATH AND CORONARY ANGIOGRAPHY;  Surgeon: Tonny Bollman, MD;  Location: [facility] INVASIVE CV LAB;  Service: Cardiovascular;  Laterality: N/A;   LEFT HEART CATHETERIZATION WITH CORONARY/GRAFT ANGIOGRAM N/A 01/08/2015   Procedure: LEFT HEART CATHETERIZATION WITH Isabel Caprice;  Surgeon: Othella Boyer, MD;  Location: [facility] CATH LAB;  Service: Cardiovascular;  Laterality: N/A;   parathyroid adenoma  1981   surgery   PROSTATE SURGERY     rad seeds   SPINE SURGERY  2006   TONSILLECTOMY  1955   Social History:  reports that he quit smoking about 52 years ago. His smoking use included cigarettes. He has never used smokeless tobacco. He reports current alcohol use of about 6.0 standard drinks of alcohol per week. He reports that he does not use drugs.  Allergies  Allergen Reactions   Ace Inhibitors Cough   Crestor [Rosuvastatin] Other (See Comments)    Muscle aches   Nitroglycerin Other (See Comments)    Very pronounced lowered BP with IV nitro for caths   Quinolones Other (See Comments)    Aortic root enlargement    Family History  Problem Relation Age of Onset   Heart disease Father        Heart Disease before age 44   Heart attack Father    Hyperlipidemia Father    Hypertension Father    Stroke Mother    Deep vein thrombosis Mother    Heart disease Sister    Diabetes Sister    Hyperlipidemia Sister    Hypertension Sister    Heart attack Sister    Heart disease Paternal Uncle    Diabetes Maternal Grandmother    Diabetes Sister     Prior to Admission medications   Medication Sig Start Date End Date Taking? Authorizing Provider  acetaminophen (TYLENOL) 650 MG CR tablet Take 650 mg  by mouth every 8 (eight) hours as needed for pain.   Yes [provider]  allopurinol (ZYLOPRIM) 300 MG tablet Take 300 mg by mouth every other day. on even days of the month   Yes [provider]  amLODipine (NORVASC) 10 MG tablet Take 1 tablet (10 mg total) by mouth daily. 12/15/22  Yes Jake Bathe, MD  apixaban (ELIQUIS) 5 MG TABS tablet Take 1 tablet (5 mg total) by mouth 2 (two) times daily. 02/27/23  Yes Jake Bathe, MD  Ascorbic Acid (VITAMIN C) 500 MG CAPS Take 500 mg by mouth daily at 6 (six) AM.   Yes [provider]  atorvastatin (LIPITOR) 40 MG tablet Take 1 tablet (40 mg total) by mouth daily. 06/18/22  Yes Barrett,  Joline Salt, PA-C  carvedilol (COREG) 6.25 MG tablet Take 1 tablet (6.25 mg total) by mouth 2 (two) times daily. 02/21/23  Yes Swinyer, Zachary George, NP  Cholecalciferol (VITAMIN D3) 2000 UNITS TABS Take 2,000 Units by mouth daily.    Yes [provider]  clonazePAM (KLONOPIN) 1 MG tablet Take 0.5 mg by mouth at bedtime.   Yes [provider]  clopidogrel (PLAVIX) 75 MG tablet Take 1 tablet (75 mg total) by mouth daily. 01/22/23  Yes Jake Bathe, MD  Coenzyme Q10 300 MG CAPS Take 300 mg by mouth daily.   Yes [provider]  dapagliflozin propanediol (FARXIGA) 10 MG TABS tablet Take 1 tablet (10 mg total) by mouth daily before breakfast. 03/22/23  Yes Shamleffer, Konrad Dolores, MD  ezetimibe (ZETIA) 10 MG tablet TAKE 1 TABLET BY MOUTH DAILY 04/17/23  Yes Orbie Pyo, MD  fenofibrate micronized (LOFIBRA) 200 MG capsule TAKE 1 CAPSULE BY MOUTH DAILY  BEFORE BREAKFAST Patient taking differently: Take 200 mg by mouth daily before breakfast. 12/18/22  Yes Shamleffer, Konrad Dolores, MD  fluorouracil (EFUDEX) 5 % cream Apply 1 Application topically daily as needed (cancer spots). Use as directed   Yes [provider]  fluticasone (FLONASE) 50 MCG/ACT nasal spray Place 2 sprays into both nostrils daily as needed for  allergies.   Yes [provider]  glipiZIDE (GLUCOTROL) 5 MG tablet Take 0.5 tablets (2.5 mg total) by mouth 2 (two) times daily before a meal. 03/22/23  Yes Shamleffer, Konrad Dolores, MD  isosorbide mononitrate (IMDUR) 30 MG 24 hr tablet Take 1 tablet (30 mg total) by mouth daily. 12/06/22  Yes Jake Bathe, MD  levothyroxine (SYNTHROID, LEVOTHROID) 175 MCG tablet Take 175 mcg by mouth daily before breakfast.   Yes [provider]  lidocaine (ASPERCREME LIDOCAINE) 4 % Place 1 patch onto the skin daily as needed (pain).   Yes [provider]  loratadine (CLARITIN) 10 MG tablet Take 10 mg by mouth at bedtime.   Yes [provider]  magnesium oxide (MAG-OX) 400 (240 Mg) MG tablet Take 400 mg by mouth daily.   Yes [provider]  Multiple Vitamins-Minerals (PRESERVISION AREDS 2) CAPS Take 1 capsule by mouth 2 (two) times daily.   Yes [provider]  nitroGLYCERIN (NITROSTAT) 0.4 MG SL tablet Place 1 tablet (0.4 mg total) under the tongue every 5 (five) minutes as needed for chest pain. 06/17/22  Yes Barrett, Joline Salt, PA-C  olmesartan (BENICAR) 40 MG tablet Take 1 tablet (40 mg total) by mouth daily. 03/05/23  Yes Jake Bathe, MD  oxyCODONE 10 MG TABS Take 1 tablet (10 mg total) by mouth every 4 (four) hours as needed for severe pain ((score 7 to 10)). 11/11/22  Yes Arman Bogus, MD  pantoprazole (PROTONIX) 40 MG tablet TAKE 1 TABLET BY MOUTH DAILY Patient taking differently: Take 40 mg by mouth daily. 08/07/22  Yes Jake Bathe, MD  Plant Sterols and Stanols (CHOLESTOFF PLUS PO) Take 1 capsule by mouth daily.   Yes [provider]  Polyethyl Glycol-Propyl Glycol (SYSTANE OP) Place 1 drop into both eyes daily as needed (dry/irritated eyes).   Yes [provider]  Tamsulosin HCl (FLOMAX) 0.4 MG CAPS Take 0.4 mg by mouth every other day.   Yes [provider]  triamcinolone cream (KENALOG) 0.1 % Apply 1 application   topically daily as needed (rash). 02/07/18  Yes [provider]  TURMERIC PO Take 2 tablets  by mouth in the morning and at bedtime.   Yes [provider]  VASCEPA 1 g capsule TAKE 2 CAPSULES BY MOUTH TWICE A DAY 04/06/23  Yes Jake Bathe, MD  ACCU-CHEK GUIDE test strip USE TWICE DAILY 05/29/22   Shamleffer, Konrad Dolores, MD  Blood Pressure Monitoring (OMRON 3 SERIES BP MONITOR) DEVI 1 each by Does not apply route daily. 01/16/23   Jake Bathe, MD    Physical Exam: Vitals:   04/25/23 0145 04/25/23 0204 04/25/23 0401 04/25/23 0821  BP: (!) 132/91 126/83 134/81 138/83  Pulse: 64 69 61 68  Resp: 13 16 17 17   Temp: 98 F (36.7 C) 97.6 F (36.4 C) 97.6 F (36.4 C) (!) 97.5 F (36.4 C)  TempSrc:  Oral Oral Oral  SpO2: 92% 96% 98% 91%  Weight:   84.4 kg   Height:        Constitutional: Elderly male currently in no acute distress Eyes: PERRL, lids and conjunctivae normal ENMT: Mucous membranes are moist.  Fair dentition Neck: normal, supple, no masses, no thyromegaly Respiratory: clear to auscultation bilaterally, no wheezing, no crackles. Normal respiratory effort. No accessory muscle use.  Cardiovascular: Regular rate and rhythm, no murmurs / rubs / gallops.   Abdomen: no tenderness, no masses palpated. No hepatosplenomegaly. Bowel sounds positive.  Musculoskeletal: no clubbing / cyanosis. No joint deformity upper and lower extremities. Good ROM, no contractures. Normal muscle tone.  Skin: no rashes, lesions, ulcers. No induration Neurologic: CN 2-12 grossly intact. Sensation intact, DTR normal. Strength 5/5 in all 4.  Psychiatric: Normal judgment and insight. Alert and oriented x 3. Normal mood.   Data Reviewed:  Reviewed labs, imaging, and pertinent records as noted above in the HPI.  Assessment and Plan:  Sepsis secondary to pyelonephritis Ureteral stone with hydronephrosis Patient presented with complaints of right flank pain over the last week.   Initially noted to be febrile up to 100.8 F with tachycardia, tachypnea, and leukopenia meeting SIRS criteria.  Urinalysis positive for large hemoglobin, large leukocytes, rare bacteria, greater than 50 WBCs.Found to have a obstructing 8 x 8 mm stone in the distal right ureter with moderate proximal hydroureteronephrosis.   Lactic acid was noted to be elevated initially at 3.1. check within normal limits patient has been initially started on empiric antibiotics of Rocephin.  Patient was taken for cystoscopy with right ureteral stent placement and right retrograde pyelogram by Dr.  Marlou Porch.  Patient was switched to Bactrim to complete a 2-week course. -Admit to a surgical telemetry bed -Continue Bactrim -Oxycodone as needed for pain -Patient recommended to follow-up in the outpatient setting with [facility] for lithotripsy -Appreciate cardiology consultative services for clearance  Acute kidney injury superimposed on chronic kidney disease stage IIIb Creatinine on admission noted to be elevated up to 2.29 with BUN 27.  Secondary to above.  Creatinine improved after IV fluids and stent placement to 1.88 with BUN 28. -Normal Saline IV fluids -Continue to monitor kidney function in outpatient setting  Paroxysmal atrial fibrillation on chronic anticoagulation Patient appears to be in sinus rhythm at this time. -Continue Coreg and Eliquis  Essential hypertension -Continue amlodipine, -Continue outpatient follow-up with hypertension clinic  CAD s/p CABG -Continue Imdur, Zetia, atorvastatin, Coreg  Hyperlipidemia -Continue atorvastatin and Zetia  Diabetes mellitus type 2, without long-term use of insulin -Continue glipizide in outpatient setting  Hypothyroidism -Continue levothyroxine  History of kidney stones -Continue outpatient follow-up with urology  BPH  -continue Flomax  AAA Noted  to be 6.6 x 4.7 cm previously 6.4 x 4.5 cm s/p EVAR.  Patient followed by Dr. Lavinia Sharps and  interventional radiology and outpatient setting -Recommend outpatient surveillance with annual CT or MRI   Advance Care Planning:   Code Status: Full Code   Consults: urology and cardiology  Family Communication: None  Severity of Illness: The appropriate patient status for this patient is OBSERVATION. Observation status is judged to be reasonable and necessary in order to provide the required intensity of service to ensure the patient's safety. The patient's presenting symptoms, physical exam findings, and initial radiographic and laboratory data in the context of their medical condition is felt to place them at decreased risk for further clinical deterioration. Furthermore, it is anticipated that the patient will be medically stable for discharge from the hospital within 2 midnights of admission.   Author: Clydie Braun, MD 04/25/2023 1:12 PM  For on call review [email or web address].

## 2023-04-25 NOTE — Anesthesia Procedure Notes (Signed)
Procedure Name: Intubation Date/Time: 04/25/2023 12:41 AM  Performed by: Claudina Lick, CRNAPre-anesthesia Checklist: Patient identified, Emergency Drugs available, Suction available and Patient being monitored Patient Re-evaluated:Patient Re-evaluated prior to induction Oxygen Delivery Method: Circle system utilized Preoxygenation: Pre-oxygenation with 100% oxygen Induction Type: IV induction, Rapid sequence and Cricoid Pressure applied Laryngoscope Size: Miller and 2 Grade View: Grade I Tube type: Oral Tube size: 7.5 mm Number of attempts: 1 Airway Equipment and Method: Stylet Placement Confirmation: ETT inserted through vocal cords under direct vision, positive ETCO2 and breath sounds checked- equal and bilateral Secured at: 23 cm Tube secured with: Tape Dental Injury: Teeth and Oropharynx as per pre-operative assessment

## 2023-04-25 NOTE — Consult Note (Addendum)
Patient tolerated the stent placement without issue.  I did not leave a Foley catheter in this patient.  I stopped his IV antibiotic and started Bactrim double strength.  If he tolerates this and is afebrile over the next 24 hours, he can be discharged home with this.  The patient will need follow-up ureteroscopy in the near future.  Prior to discharge, the patient should get a cardiac clearance to stop his anticoagulation in time for surgery in the coming weeks.

## 2023-04-25 NOTE — Anesthesia Postprocedure Evaluation (Signed)
Anesthesia Post Note  Patient: Garrett CERDA Sr.  Procedure(s) Performed: CYSTOSCOPY WITH RETROGRADE PYELOGRAM/URETERAL STENT PLACEMENT (Right)     Patient location during evaluation: PACU Anesthesia Type: General Level of consciousness: awake and alert Pain management: pain level controlled Vital Signs Assessment: post-procedure vital signs reviewed and stable Respiratory status: spontaneous breathing, nonlabored ventilation and respiratory function stable Cardiovascular status: blood pressure returned to baseline and stable Postop Assessment: no apparent nausea or vomiting Anesthetic complications: no  No notable events documented.  Last Vitals:  Vitals:   04/25/23 0401 04/25/23 0821  BP: 134/81 138/83  Pulse: 61 68  Resp: 17 17  Temp: 36.4 C (!) 36.4 C  SpO2: 98% 91%    Last Pain:  Vitals:   04/25/23 0401  TempSrc: Oral  PainSc:                  Shamra Bradeen,W. EDMOND

## 2023-04-25 NOTE — Care Management Obs Status (Signed)
MEDICARE OBSERVATION STATUS NOTIFICATION   Patient Details  Name: Garrett JAMAR Sr. MRN: [id number] Date of Birth: August 21, 1941   Medicare Observation Status Notification Given:  Yes    Lawerance Sabal, RN 04/25/2023, 12:01 PM

## 2023-04-26 ENCOUNTER — Encounter (HOSPITAL_COMMUNITY): Payer: Self-pay

## 2023-04-26 ENCOUNTER — Other Ambulatory Visit: Payer: Self-pay

## 2023-04-26 ENCOUNTER — Encounter (HOSPITAL_BASED_OUTPATIENT_CLINIC_OR_DEPARTMENT_OTHER): Payer: Self-pay | Admitting: Emergency Medicine

## 2023-04-26 ENCOUNTER — Emergency Department (HOSPITAL_COMMUNITY)
Admission: EM | Admit: 2023-04-26 | Discharge: 2023-04-26 | Disposition: A | Discharge status: Home / Self Care | Payer: Medicare Other | Source: Home / Self Care | Attending: Emergency Medicine | Admitting: Emergency Medicine

## 2023-04-26 ENCOUNTER — Emergency Department (HOSPITAL_COMMUNITY): Payer: Medicare Other

## 2023-04-26 ENCOUNTER — Inpatient Hospital Stay (HOSPITAL_BASED_OUTPATIENT_CLINIC_OR_DEPARTMENT_OTHER)
Admission: EM | Admit: 2023-04-26 | Discharge: 2023-05-04 | DRG: 690 | Disposition: A | Discharge status: Home / Self Care | Payer: Medicare Other | Attending: Family Medicine | Admitting: Family Medicine

## 2023-04-26 ENCOUNTER — Emergency Department (HOSPITAL_COMMUNITY)
Admission: EM | Admit: 2023-04-26 | Discharge: 2023-04-26 | Discharge status: Against Medical Advice | Payer: Medicare Other | Source: Home / Self Care

## 2023-04-26 DIAGNOSIS — Z888 Allergy status to other drugs, medicaments and biological substances status: Secondary | ICD-10-CM

## 2023-04-26 DIAGNOSIS — D72819 Decreased white blood cell count, unspecified: Secondary | ICD-10-CM | POA: Diagnosis not present

## 2023-04-26 DIAGNOSIS — R197 Diarrhea, unspecified: Secondary | ICD-10-CM | POA: Diagnosis present

## 2023-04-26 DIAGNOSIS — N183 Chronic kidney disease, stage 3 unspecified: Secondary | ICD-10-CM | POA: Diagnosis present

## 2023-04-26 DIAGNOSIS — Z7984 Long term (current) use of oral hypoglycemic drugs: Secondary | ICD-10-CM

## 2023-04-26 DIAGNOSIS — K219 Gastro-esophageal reflux disease without esophagitis: Secondary | ICD-10-CM | POA: Diagnosis present

## 2023-04-26 DIAGNOSIS — N200 Calculus of kidney: Secondary | ICD-10-CM | POA: Diagnosis not present

## 2023-04-26 DIAGNOSIS — M170 Bilateral primary osteoarthritis of knee: Secondary | ICD-10-CM | POA: Diagnosis present

## 2023-04-26 DIAGNOSIS — R310 Gross hematuria: Secondary | ICD-10-CM | POA: Diagnosis not present

## 2023-04-26 DIAGNOSIS — Z79899 Other long term (current) drug therapy: Secondary | ICD-10-CM

## 2023-04-26 DIAGNOSIS — Y69 Unspecified misadventure during surgical and medical care: Secondary | ICD-10-CM | POA: Insufficient documentation

## 2023-04-26 DIAGNOSIS — Z955 Presence of coronary angioplasty implant and graft: Secondary | ICD-10-CM

## 2023-04-26 DIAGNOSIS — Z8249 Family history of ischemic heart disease and other diseases of the circulatory system: Secondary | ICD-10-CM

## 2023-04-26 DIAGNOSIS — Y842 Radiological procedure and radiotherapy as the cause of abnormal reaction of the patient, or of later complication, without mention of misadventure at the time of the procedure: Secondary | ICD-10-CM | POA: Diagnosis present

## 2023-04-26 DIAGNOSIS — Z7902 Long term (current) use of antithrombotics/antiplatelets: Secondary | ICD-10-CM | POA: Insufficient documentation

## 2023-04-26 DIAGNOSIS — Z7901 Long term (current) use of anticoagulants: Secondary | ICD-10-CM

## 2023-04-26 DIAGNOSIS — I25810 Atherosclerosis of coronary artery bypass graft(s) without angina pectoris: Secondary | ICD-10-CM | POA: Diagnosis present

## 2023-04-26 DIAGNOSIS — R339 Retention of urine, unspecified: Secondary | ICD-10-CM | POA: Insufficient documentation

## 2023-04-26 DIAGNOSIS — Z87891 Personal history of nicotine dependence: Secondary | ICD-10-CM

## 2023-04-26 DIAGNOSIS — E876 Hypokalemia: Secondary | ICD-10-CM | POA: Diagnosis not present

## 2023-04-26 DIAGNOSIS — D6832 Hemorrhagic disorder due to extrinsic circulating anticoagulants: Secondary | ICD-10-CM | POA: Diagnosis present

## 2023-04-26 DIAGNOSIS — Z823 Family history of stroke: Secondary | ICD-10-CM

## 2023-04-26 DIAGNOSIS — T83031A Leakage of indwelling urethral catheter, initial encounter: Secondary | ICD-10-CM | POA: Insufficient documentation

## 2023-04-26 DIAGNOSIS — Z8349 Family history of other endocrine, nutritional and metabolic diseases: Secondary | ICD-10-CM

## 2023-04-26 DIAGNOSIS — N281 Cyst of kidney, acquired: Secondary | ICD-10-CM | POA: Diagnosis not present

## 2023-04-26 DIAGNOSIS — I252 Old myocardial infarction: Secondary | ICD-10-CM

## 2023-04-26 DIAGNOSIS — N201 Calculus of ureter: Secondary | ICD-10-CM | POA: Diagnosis not present

## 2023-04-26 DIAGNOSIS — I10 Essential (primary) hypertension: Secondary | ICD-10-CM | POA: Insufficient documentation

## 2023-04-26 DIAGNOSIS — I480 Paroxysmal atrial fibrillation: Secondary | ICD-10-CM | POA: Diagnosis present

## 2023-04-26 DIAGNOSIS — Z833 Family history of diabetes mellitus: Secondary | ICD-10-CM

## 2023-04-26 DIAGNOSIS — Z5321 Procedure and treatment not carried out due to patient leaving prior to being seen by health care provider: Secondary | ICD-10-CM | POA: Insufficient documentation

## 2023-04-26 DIAGNOSIS — Z881 Allergy status to other antibiotic agents status: Secondary | ICD-10-CM

## 2023-04-26 DIAGNOSIS — Z95828 Presence of other vascular implants and grafts: Secondary | ICD-10-CM

## 2023-04-26 DIAGNOSIS — N132 Hydronephrosis with renal and ureteral calculous obstruction: Secondary | ICD-10-CM | POA: Diagnosis present

## 2023-04-26 DIAGNOSIS — E1141 Type 2 diabetes mellitus with diabetic mononeuropathy: Secondary | ICD-10-CM | POA: Diagnosis present

## 2023-04-26 DIAGNOSIS — N136 Pyonephrosis: Principal | ICD-10-CM | POA: Diagnosis present

## 2023-04-26 DIAGNOSIS — I129 Hypertensive chronic kidney disease with stage 1 through stage 4 chronic kidney disease, or unspecified chronic kidney disease: Secondary | ICD-10-CM | POA: Diagnosis present

## 2023-04-26 DIAGNOSIS — I714 Abdominal aortic aneurysm, without rupture, unspecified: Secondary | ICD-10-CM | POA: Diagnosis present

## 2023-04-26 DIAGNOSIS — E1122 Type 2 diabetes mellitus with diabetic chronic kidney disease: Secondary | ICD-10-CM | POA: Diagnosis present

## 2023-04-26 DIAGNOSIS — N1832 Chronic kidney disease, stage 3b: Secondary | ICD-10-CM | POA: Diagnosis present

## 2023-04-26 DIAGNOSIS — E119 Type 2 diabetes mellitus without complications: Secondary | ICD-10-CM | POA: Insufficient documentation

## 2023-04-26 DIAGNOSIS — I4510 Unspecified right bundle-branch block: Secondary | ICD-10-CM | POA: Diagnosis present

## 2023-04-26 DIAGNOSIS — Z8546 Personal history of malignant neoplasm of prostate: Secondary | ICD-10-CM

## 2023-04-26 DIAGNOSIS — E8721 Acute metabolic acidosis: Secondary | ICD-10-CM | POA: Diagnosis present

## 2023-04-26 DIAGNOSIS — E871 Hypo-osmolality and hyponatremia: Secondary | ICD-10-CM | POA: Diagnosis present

## 2023-04-26 DIAGNOSIS — R319 Hematuria, unspecified: Secondary | ICD-10-CM

## 2023-04-26 DIAGNOSIS — Z79890 Hormone replacement therapy (postmenopausal): Secondary | ICD-10-CM

## 2023-04-26 DIAGNOSIS — E785 Hyperlipidemia, unspecified: Secondary | ICD-10-CM | POA: Diagnosis present

## 2023-04-26 DIAGNOSIS — Z923 Personal history of irradiation: Secondary | ICD-10-CM

## 2023-04-26 DIAGNOSIS — E1165 Type 2 diabetes mellitus with hyperglycemia: Secondary | ICD-10-CM | POA: Diagnosis not present

## 2023-04-26 DIAGNOSIS — E039 Hypothyroidism, unspecified: Secondary | ICD-10-CM | POA: Diagnosis present

## 2023-04-26 DIAGNOSIS — N390 Urinary tract infection, site not specified: Secondary | ICD-10-CM | POA: Diagnosis present

## 2023-04-26 DIAGNOSIS — T45515A Adverse effect of anticoagulants, initial encounter: Secondary | ICD-10-CM | POA: Diagnosis present

## 2023-04-26 DIAGNOSIS — D696 Thrombocytopenia, unspecified: Secondary | ICD-10-CM | POA: Diagnosis not present

## 2023-04-26 DIAGNOSIS — I2510 Atherosclerotic heart disease of native coronary artery without angina pectoris: Secondary | ICD-10-CM | POA: Diagnosis present

## 2023-04-26 LAB — CBC
HCT: 37.2 % — ABNORMAL LOW (ref 39.0–52.0)
Hemoglobin: 12.3 g/dL — ABNORMAL LOW (ref 13.0–17.0)
MCH: 29.3 pg (ref 26.0–34.0)
MCHC: 33.1 g/dL (ref 30.0–36.0)
MCV: 88.6 fL (ref 80.0–100.0)
Platelets: 121 10*3/uL — ABNORMAL LOW (ref 150–400)
RBC: 4.2 MIL/uL — ABNORMAL LOW (ref 4.22–5.81)
RDW: 13.7 % (ref 11.5–15.5)
WBC: 3 10*3/uL — ABNORMAL LOW (ref 4.0–10.5)
nRBC: 0 % (ref 0.0–0.2)

## 2023-04-26 LAB — URINALYSIS, ROUTINE W REFLEX MICROSCOPIC
Glucose, UA: >=500 mg/dL — AB
Ketones, ur: 15 mg/dL — AB
Nitrite: POSITIVE — AB
Protein, ur: >300 mg/dL — AB
Specific Gravity, Urine: 1.015 (ref 1.005–1.030)
pH: 6.5 (ref 5.0–8.0)

## 2023-04-26 LAB — BASIC METABOLIC PANEL
Anion gap: 15 (ref 5–15)
BUN: 29 mg/dL — ABNORMAL HIGH (ref 8–23)
CO2: 19 mmol/L — ABNORMAL LOW (ref 22–32)
Calcium: 9.6 mg/dL (ref 8.9–10.3)
Chloride: 102 mmol/L (ref 98–111)
Creatinine, Ser: 1.72 mg/dL — ABNORMAL HIGH (ref 0.61–1.24)
GFR, Estimated: 40 mL/min — ABNORMAL LOW (ref >60)
Glucose, Bld: 130 mg/dL — ABNORMAL HIGH (ref 70–99)
Potassium: 3.7 mmol/L (ref 3.5–5.1)
Sodium: 136 mmol/L (ref 135–145)

## 2023-04-26 LAB — URINALYSIS, MICROSCOPIC (REFLEX): RBC / HPF: >50 RBC/hpf (ref 0–5)

## 2023-04-26 LAB — CULTURE, BLOOD (ROUTINE X 2): Culture: NO GROWTH

## 2023-04-26 NOTE — ED Provider Triage Note (Signed)
Emergency Medicine Provider Triage Evaluation Note  Garrett SERPE Sr. , a 82 y.o. male  was evaluated in triage.  Pt complains of stent placement on 6/12, now unable to urinate since 6PM On 6/12. Initially urinating normally, but now only dribbling blood. Reports suprapubic pressure.  Review of Systems  Positive: Urinary retention Negative: fever  Physical Exam  BP (!) 151/95 (BP Location: Right Arm)   Pulse 81   Temp 98.9 F (37.2 C)   Resp 18   SpO2 98%  Gen:   Awake, no distress  Resp:  Normal effort  MSK:   Moves extremities without difficulty  Other:  +suprapubic pain  Medical Decision Making  Medically screening exam initiated at 6:29 AM.  Appropriate orders placed.  Garrett Graves Sr. was informed that the remainder of the evaluation will be completed by another provider, this initial triage assessment does not replace that evaluation, and the importance of remaining in the ED until their evaluation is complete.     Pete Pelt, [place] 04/26/23 0630

## 2023-04-26 NOTE — ED Notes (Signed)
Bladder scan complete. > 692 ml.

## 2023-04-26 NOTE — Discharge Instructions (Signed)
I suspect a blood clot was blocking the flow of your urine.  We will keep Foley catheter in place until you follow-up with the urology clinic.  They should call you today to schedule close follow-up appointment.  On CT scan today your stent is still in the appropriate place.  Continue taking your antibiotics.  If urine is not flowing through the catheter and you are developing similar pain to what you had today call the urology office immediately.

## 2023-04-26 NOTE — Discharge Summary (Signed)
Physician Discharge Summary   Patient: Garrett CATTELL Sr. MRN: [id number] DOB: [birth year]  Admit date:     04/24/2023  Discharge date: 04/25/2023  Discharge Physician: Clydie Braun   PCP: [name], MD   Recommendations at discharge:   Follow[facility]up with [facility] in [facility] setting  Discharge Diagnoses: Principal Problem:   Ureteral stone with hydronephrosis Active Problems:   Sepsis (HCC)   Pyelonephritis   Acute kidney injury superimposed on chronic kidney disease (HCC)   Paroxysmal A-fib (HCC)   Essential hypertension   Hyperlipidemia   S/P CABG (coronary artery bypass graft)   Type 2 diabetes mellitus with stage 3b chronic kidney disease, without long-term current use of insulin (HCC)   Hypothyroidism   History of kidney stones   Benign prostatic hyperplasia   AAA (abdominal aortic aneurysm) without rupture (HCC)  Resolved Problems:   * No resolved hospital problems. *  Hospital Course: Garrett CUMMISKEY Sr. is a 82 y.o. male with medical history significant of hypertension, CAD s/p CABG, diabetes mellitus type 2, hypothyroidism, nephrolithiasis, AAA, gout, GERD who presented with complaints of right flank pain.  Patient had right flank painintermittently over the last week along with dysuria, but within the 2 days ago symptoms became severe.   Denied having any nausea or vomiting.  He gone to a walk-in clinic at first and was prescribed some medication, but reports having significant fever and chills after taking 1 dose of the medication for which he came to the hospital for further evaluation.   In the emergency department patient has been noted to be febrile up to 100.8 F with tachycardia and tachypnea meeting SIRS criteria.  Labs from 6/11 significant for WBC 5.3, platelets 146, sodium 133, BUN 27, creatinine 2.29, and lactic acid 3.4.  Renal CT significant for obstructing 8 x 8 mm stone in the distal right ureter with moderate proximal hydroureteronephrosis.   Urinalysis was positive for large hemoglobin, rare bacteria, 21-50 RBC/hpf and greater than 50 WBCs.  Blood and urine cultures have been obtained.  Patient had been started on empiric antibiotics of Rocephin initially.  Dr. Marlou Porch of urology had been consulted and took patient for cystoscopy and stent placement.  Patient tolerated procedure well and was switched to Rocephin.  Cardiology had been consulted for cardiac clearance for need of lithotripsy.  Assessment and Plan: Sepsis secondary to pyelonephritis Ureteral stone with hydronephrosis Patient presented with complaints of right flank pain over the last week.  Initially noted to be febrile up to 100.8 F with tachycardia, tachypnea, and leukopenia meeting SIRS criteria.  Urinalysis positive for large hemoglobin, large leukocytes, rare bacteria, greater than 50 WBCs.Found to have a obstructing 8 x 8 mm stone in the distal right ureter with moderate proximal hydroureteronephrosis.   Lactic acid was noted to be elevated initially at 3.1. check within normal limits patient has been initially started on empiric antibiotics of Rocephin.  Patient was taken for cystoscopy with right ureteral stent placement and right retrograde pyelogram by Dr.  Marlou Porch.  Patient was switched to Bactrim to complete a 2-week course.  Acute kidney injury superimposed on chronic kidney disease stage IIIb Creatinine on admission noted to be elevated up to 2.29 with BUN 27.  Secondary to above.  Creatinine improved after IV fluids and stent placement to 1.88 with BUN 28.  Recommended to follow-up with primary to continue to monitor kidney function.  Paroxysmal atrial fibrillation on chronic anticoagulation Patient appears to be in sinus rhythm at this  time. -Continue Coreg and Eliquis   Essential hypertension -Continue amlodipine, -Continue outpatient follow-up with hypertension clinic   CAD s/p CABG -Continue Imdur, Zetia, atorvastatin, Coreg    Hyperlipidemia -Continue atorvastatin and Zetia   Diabetes mellitus type 2, without long-term use of insulin -Continue glipizide in outpatient setting   Hypothyroidism -Continue levothyroxine   History of kidney stones -Continue outpatient follow-up with urology   BPH  -continue Flomax   AAA Noted to be 6.6 x 4.7 cm previously 6.4 x 4.5 cm s/p EVAR.  Patient followed by Dr. Lavinia Sharps and interventional radiology and outpatient setting -Recommend outpatient surveillance with annual CT or MRI     Consultants: Urology Procedures performed: Stent plate Disposition: Home Diet recommendation:  Discharge Diet Orders (From admission, onward)     Start     Ordered   04/25/23 0000  Diet - low sodium heart healthy        04/25/23 1514            DISCHARGE MEDICATION: Allergies as of 04/25/2023       Reactions   Ace Inhibitors Cough   Crestor [rosuvastatin] Other (See Comments)   Muscle aches   Nitroglycerin Other (See Comments)   Very pronounced lowered BP with IV nitro for caths   Quinolones Other (See Comments)   Aortic root enlargement        Medication List     TAKE these medications    Accu-Chek Guide test strip Generic drug: glucose blood USE TWICE DAILY   acetaminophen 650 MG CR tablet Commonly known as: TYLENOL Take 650 mg by mouth 2 (two) times daily as needed for pain.   allopurinol 300 MG tablet Commonly known as: ZYLOPRIM Take 300 mg by mouth every other day. on even days of the month   amLODipine 10 MG tablet Commonly known as: NORVASC Take 1 tablet (10 mg total) by mouth daily.   apixaban 5 MG Tabs tablet Commonly known as: Eliquis Take 1 tablet (5 mg total) by mouth 2 (two) times daily.   Aspercreme Lidocaine 4 % Generic drug: lidocaine Place 1 patch onto the skin daily as needed (pain).   atorvastatin 40 MG tablet Commonly known as: LIPITOR Take 1 tablet (40 mg total) by mouth daily.   carvedilol 6.25 MG tablet Commonly known  as: COREG Take 1 tablet (6.25 mg total) by mouth 2 (two) times daily.   CHOLESTOFF PLUS PO Take 1 capsule by mouth daily.   clonazePAM 1 MG tablet Commonly known as: KLONOPIN Take 0.5 mg by mouth at bedtime.   clopidogrel 75 MG tablet Commonly known as: PLAVIX Take 1 tablet (75 mg total) by mouth daily.   Coenzyme Q10 300 MG Caps Take 300 mg by mouth daily.   dapagliflozin propanediol 10 MG Tabs tablet Commonly known as: Farxiga Take 1 tablet (10 mg total) by mouth daily before breakfast.   ezetimibe 10 MG tablet Commonly known as: ZETIA TAKE 1 TABLET BY MOUTH DAILY   fenofibrate micronized 200 MG capsule Commonly known as: LOFIBRA TAKE 1 CAPSULE BY MOUTH DAILY  BEFORE BREAKFAST What changed: See the new instructions.   fluorouracil 5 % cream Commonly known as: EFUDEX Apply 1 Application topically daily as needed (cancer spots). Use as directed   fluticasone 50 MCG/ACT nasal spray Commonly known as: FLONASE Place 2 sprays into both nostrils daily as needed for allergies.   glipiZIDE 5 MG tablet Commonly known as: GLUCOTROL Take 0.5 tablets (2.5 mg total) by mouth 2 (two) times  daily before a meal.   isosorbide mononitrate 30 MG 24 hr tablet Commonly known as: IMDUR Take 1 tablet (30 mg total) by mouth daily.   levothyroxine 175 MCG tablet Commonly known as: SYNTHROID Take 175 mcg by mouth daily before breakfast.   loratadine 10 MG tablet Commonly known as: CLARITIN Take 10 mg by mouth at bedtime.   magnesium oxide 400 (240 Mg) MG tablet Commonly known as: MAG-OX Take 400 mg by mouth daily.   nitroGLYCERIN 0.4 MG SL tablet Commonly known as: Nitrostat Place 1 tablet (0.4 mg total) under the tongue every 5 (five) minutes as needed for chest pain.   olmesartan 40 MG tablet Commonly known as: BENICAR Take 1 tablet (40 mg total) by mouth daily.   Omron 3 Series BP Monitor Devi 1 each by Does not apply route daily.   Oxycodone HCl 10 MG Tabs Take 1  tablet (10 mg total) by mouth every 4 (four) hours as needed for severe pain ((score 7 to 10)).   pantoprazole 40 MG tablet Commonly known as: PROTONIX TAKE 1 TABLET BY MOUTH DAILY What changed: when to take this   PreserVision AREDS 2 Caps Take 1 capsule by mouth 2 (two) times daily.   sulfamethoxazole-trimethoprim 400-80 MG tablet Commonly known as: BACTRIM Take 1 tablet by mouth every 12 (twelve) hours for 14 days.   SYSTANE OP Place 1 drop into both eyes daily as needed (dry/irritated eyes).   tamsulosin 0.4 MG Caps capsule Commonly known as: FLOMAX Take 0.4 mg by mouth every other day.   triamcinolone cream 0.1 % Commonly known as: KENALOG Apply 1 application  topically daily as needed (rash).   TURMERIC PO Take 2 tablets by mouth in the morning and at bedtime.   Vascepa 1 g capsule Generic drug: icosapent Ethyl TAKE 2 CAPSULES BY MOUTH TWICE A DAY   Vitamin C 500 MG Caps Take 500 mg by mouth daily at 6 (six) AM.   Vitamin D3 50 MCG (2000 UT) Tabs Take 2,000 Units by mouth daily.        Follow-up Information     Crist Fat, MD. Schedule an appointment as soon as possible for a visit.   Specialty: Urology Why: Will be contacted with OR time and date for stone removal within the next few days. Contact information: [address] [place] [address] [phone]                Discharge Exam: Ceasar Mons Weights   04/24/23 1601 04/25/23 0401  Weight: 122.5 kg 84.4 kg   Constitutional: Elderly male currently in no acute distress Eyes: PERRL, lids and conjunctivae normal Respiratory: clear to auscultation bilaterally, no wheezing, no crackles. Normal respiratory effort. No accessory muscle use.  Cardiovascular: Regular rate and rhythm, no murmurs / rubs / gallops.   Abdomen: no tenderness.Bowel sounds positive.  Skin: no rashes, lesions, ulcers. No induration Neurologic: CN 2-12 grossly intact.  Strength 5/5 in all 4.  Psychiatric: Normal  judgment and insight. Alert and oriented x 3. Normal mood.   Condition at discharge: good  The results of significant diagnostics from this hospitalization (including imaging, microbiology, ancillary and laboratory) are listed below for reference.   Imaging Studies: CT Renal Stone Study  Result Date: 04/26/2023 CLINICAL DATA:  Status post right ureteral stent placement 04/25/2023 now with urinary retention EXAM: CT ABDOMEN AND PELVIS WITHOUT CONTRAST TECHNIQUE: Multidetector CT imaging of the abdomen and pelvis was performed following the standard protocol without IV contrast.  RADIATION DOSE REDUCTION: This exam was performed according to the departmental dose-optimization program which includes automated exposure control, adjustment of the mA and/or kV according to patient size and/or use of iterative reconstruction technique. COMPARISON:  CT abdomen and pelvis dated 04/24/2023 FINDINGS: Lower chest: No focal consolidation or pulmonary nodule in the lung bases. New trace bilateral pleural effusions. Partially imaged heart size is normal. Coronary artery calcifications. Hepatobiliary: No focal hepatic lesions. No intra or extrahepatic biliary ductal dilation. Cholelithiasis. New mild surrounding stranding. Pancreas: No focal lesions or main ductal dilation. Spleen: Enlarged in the AP dimension, 15.1 cm, previously 14.1 cm. Adrenals/Urinary Tract: No adrenal nodules. Right ureteral stent in-situ with proximal pigtail in the upper pole calyces and distal pigtail within the urinary bladder. Similar position of 6 mm stone within the distal right ureter adjacent to the stent (3:66). Unchanged 5 mm stone within the left renal pelvis (3:37). Unchanged additional nonobstructing stone within the left upper pole. No hydronephrosis. Right interpolar simple-appearing cysts. No specific follow-up imaging recommended. Urinary bladder is decompressed with catheter in-situ and contains small volume air. Stomach/Bowel:  Normal appearance of the stomach. No evidence of bowel wall thickening, distention, or inflammatory changes. Normal appendix. Vascular/Lymphatic: Aortic atherosclerosis. Prior right iliac embolization. Aorto bi-iliac stent in-situ. Infrarenal abdominal aortic aneurysm measures 6.8 x 4.9 cm, unchanged. 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 Reproductive: Normal prostate size.  Brachytherapy seeds. Other: Small volume pelvic free fluid. No free air or fluid collection. Musculoskeletal: No acute or abnormal lytic or blastic osseous lesions. Prior L2-3 spinal fixation. Hardware appears intact. Multilevel degenerative changes of the partially imaged thoracic and lumbar spine. Small fat-containing bilateral inguinal and paraumbilical hernias. IMPRESSION: 1. Right ureteral stent in-situ with proximal pigtail in the upper pole calyces and distal pigtail within the urinary bladder. Similar position of 6 mm stone within the distal right ureter adjacent to the stent. 2. Unchanged 5 mm stone within the left renal pelvis. Additional left nephrolithiasis. 3. Cholelithiasis with new mild surrounding stranding, which may be secondary to overall volume status. Recommend correlation with physical examination for right upper quadrant tenderness. 4. New trace bilateral pleural effusions. 5. Unchanged infrarenal abdominal aortic aneurysm measuring up to 6.8 cm with aorto bi-iliac stent in-situ. Recommend clinical follow-up as per vascular specialist. 6. Splenomegaly. 7. Aortic Atherosclerosis (ICD10-I70.0). Coronary artery calcifications. Assessment for potential risk factor modification, dietary therapy or pharmacologic therapy may be warranted, if clinically indicated. Electronically Signed   By: Agustin Cree M.D.   On: 04/26/2023 10:00   DG C-Arm 1-60 Min  Result Date: 04/25/2023 CLINICAL DATA:  Retrograde urography, intraoperative examination EXAM: DG C-ARM 1-60 MIN CONTRAST:  Not listed, refer to operative  report. FLUOROSCOPY: Fluoroscopy Time:  24 seconds Radiation Exposure Index (if provided by the fluoroscopic device): 4.42 mGy Number of Acquired Spot Images: 3 COMPARISON:  CT 04/24/2023 FINDINGS: Initial image demonstrates retrograde urography with a few small angular filling defects within the distal right ureter likely representing residual debris. The 6 mm calculus noted on prior CT examination is not clearly identified, likely removed in the interval. No hydroureter. Subsequent images demonstrate contrast opacification of the right pelvicaliceal system and deployment of a ureteral stent extending from the upper pole calices to the bladder. No definite extraluminal extension of contrast. IMPRESSION: 1. Retrograde urography with right ureteral stent placement. No definite extraluminal extension of contrast. Electronically Signed   By: Helyn Numbers M.D.   On: 04/25/2023 02:22   CT Renal Stone Study  Result Date: 04/24/2023 CLINICAL DATA:  Abdominal/flank pain, stone suspected EXAM: CT ABDOMEN AND PELVIS WITHOUT CONTRAST TECHNIQUE: Multidetector CT imaging of the abdomen and pelvis was performed following the standard protocol without IV contrast. RADIATION DOSE REDUCTION: This exam was performed according to the departmental dose-optimization program which includes automated exposure control, adjustment of the mA and/or kV according to patient size and/or use of iterative reconstruction technique. COMPARISON:  CTA 08/22/2022 FINDINGS: Lower chest: Mild dependent atelectasis. Trace right pleural effusion. Coronary artery calcifications. Mild cardiomegaly. Hepatobiliary: Diffuse hepatic steatosis. No evidence of focal liver lesion. Multiple layering stones in the gallbladder. No pericholecystic inflammation. No biliary dilatation. Pancreas: Parenchymal atrophy. No ductal dilatation or inflammation. Spleen: Mild splenomegaly, spleen measures 13.5 cm cranial caudal. Adrenals/Urinary Tract: No adrenal nodule. 8  x 8 mm stone in the distal right ureter with moderate proximal hydroureteronephrosis. Mild right perinephric edema. Punctate nonobstructing stone in the upper pole of the right kidney. Small right renal cyst, needs no further imaging follow-up. Intrarenal stones in the left kidney without left hydronephrosis. No left ureteral calculi. The urinary bladder is partially distended, no bladder stone. Stomach/Bowel: Minimal hiatal hernia. The stomach is nondistended there is no bowel obstruction or inflammation. Moderate volume of colonic stool. Sigmoid colonic redundancy. There is chronic fullness of the proximal appendix at 7 mm which tapers distally, no appendicitis. 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 The aneurysm sac measures 6.6 x 4.7 cm, previously 6.4 x 4.5 cm. Embolization coils within the aneurysm sac. There is no periaortic stranding. Coiling of the right internal iliac artery. 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 No abdominopelvic adenopathy. 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 Other: No ascites or free air. Small fat containing umbilical hernia. Musculoskeletal: Posterior rod with intrapedicular screws L2-L3. The right L3 pedicle screw extends lateral to the vertebral body cortex. Additional degenerative change in the lower lumbar spine. No acute osseous abnormalities. IMPRESSION: 1. Obstructing 8 x 8 mm stone in the distal right ureter with moderate proximal hydroureteronephrosis. 2. Additional nonobstructing stones in both kidneys. 3. 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 The aneurysm sac measures 6.6 x 4.7 cm, previously 6.4 x 4.5 cm. Recommend annual imaging follow-up by CTA or MRA. 4. Hepatic steatosis.  Mild splenomegaly. 5. Cholelithiasis without CT findings of acute cholecystitis. 6. Trace right pleural effusion. Aortic Atherosclerosis (ICD10-I70.0). Electronically Signed   By: Narda Rutherford M.D.   On:  04/24/2023 19:14   DG Chest 2 View  Result Date: 04/24/2023 CLINICAL DATA:  Shortness of breath. EXAM: CHEST - 2 VIEW COMPARISON:  August 09, 2022 FINDINGS: Multiple sternal wires and vascular clips are seen. The heart size and mediastinal contours are within normal limits. There is mild calcification of the aortic arch. Both lungs are clear. The visualized skeletal structures are unremarkable. IMPRESSION: 1. Evidence of prior median sternotomy/CABG. 2. No active cardiopulmonary disease. Electronically Signed   By: Aram Candela M.D.   On: 04/24/2023 17:39    Microbiology: Results for orders placed or performed during the hospital encounter of 04/24/23  Culture, blood (Routine x 2)     Status: None (Preliminary result)   Collection Time: 04/24/23  4:13 PM   Specimen: BLOOD  Result Value Ref Range Status   Specimen Description BLOOD RIGHT ANTECUBITAL  Final   Special Requests   Final    BOTTLES DRAWN AEROBIC AND ANAEROBIC Blood Culture results may not be optimal due to an excessive volume of blood received in  culture bottles   Culture   Final    NO GROWTH 2 DAYS Performed at [facility], [address] [place], [place] [address]    Report Status PENDING  Incomplete  Resp panel by RT-PCR (RSV, Flu A&B, Covid) Anterior Nasal Swab     Status: None   Collection Time: 04/24/23  4:56 PM   Specimen: Anterior Nasal Swab  Result Value Ref Range Status   SARS Coronavirus 2 by RT PCR NEGATIVE NEGATIVE Final   Influenza A by PCR NEGATIVE NEGATIVE Final   Influenza B by PCR NEGATIVE NEGATIVE Final    Comment: (NOTE) The Xpert Xpress SARS-CoV-2/FLU/RSV plus assay is intended as an aid in the diagnosis of influenza from Nasopharyngeal swab specimens and should not be used as a sole basis for treatment. Nasal washings and aspirates are unacceptable for Xpert Xpress SARS-CoV-2/FLU/RSV testing.  Fact Sheet for Patients: BloggerCourse.com  Fact Sheet for  Healthcare Providers: SeriousBroker.it  This test is not yet approved or cleared by the [place] FDA and has been authorized for detection and/or diagnosis of SARS-CoV-2 by FDA under an Emergency Use Authorization (EUA). This EUA will remain in effect (meaning this test can be used) for the duration of the COVID-19 declaration under Section 564(b)(1) of the Act, 21 U.S.C. section 360bbb-3(b)(1), unless the authorization is terminated or revoked.     Resp Syncytial Virus by PCR NEGATIVE NEGATIVE Final    Comment: (NOTE) Fact Sheet for Patients: BloggerCourse.com  Fact Sheet for Healthcare Providers: SeriousBroker.it  This test is not yet approved or cleared by the [place] FDA and has been authorized for detection and/or diagnosis of SARS-CoV-2 by FDA under an Emergency Use Authorization (EUA). This EUA will remain in effect (meaning this test can be used) for the duration of the COVID-19 declaration under Section 564(b)(1) of the Act, 21 U.S.C. section 360bbb-3(b)(1), unless the authorization is terminated or revoked.  Performed at [facility], [address] [place], [place] [address]   Urine Culture     Status: None   Collection Time: 04/24/23  8:41 PM   Specimen: Urine, Random  Result Value Ref Range Status   Specimen Description URINE, RANDOM  Final   Special Requests NONE Reflexed from [id number]  Final   Culture   Final    NO GROWTH Performed at [facility], [address] [place], [place] [address]    Report Status 04/25/2023 FINAL  Final  Urine Culture (for pregnant, neutropenic or urologic patients or patients with an indwelling urinary catheter)     Status: None   Collection Time: 04/24/23  8:42 PM   Specimen: Urine, Clean Catch  Result Value Ref Range Status   Specimen Description URINE, CLEAN CATCH  Final   Special Requests NONE  Final   Culture   Final    NO  GROWTH Performed at [facility], [address] [place], [place] [address]    Report Status 04/25/2023 FINAL  Final  Blood Culture (routine x 2)     Status: None (Preliminary result)   Collection Time: 04/25/23  3:06 AM   Specimen: BLOOD  Result Value Ref Range Status   Specimen Description BLOOD BLOOD RIGHT ARM  Final   Special Requests   Final    BOTTLES DRAWN AEROBIC AND ANAEROBIC Blood Culture adequate volume   Culture   Final    NO GROWTH 1 DAY Performed at [facility], [address], [place],  [place] [address]    Report Status PENDING  Incomplete    Labs: CBC: Recent Labs  Lab 04/24/23 1613 04/24/23 1620 04/25/23 0305 04/26/23 0609  WBC 3.5*  --  6.9 3.0*  NEUTROABS 3.2  --   --   --   HGB 15.0 15.6 12.4* 12.3*  HCT 44.8 46.0 37.9* 37.2*  MCV 88.0  --  89.6 88.6  PLT 146*  --  110* 121*   Basic Metabolic Panel: Recent Labs  Lab 04/24/23 1613 04/24/23 1620 04/25/23 0305 04/26/23 0609  NA 133* 134* 134* 136  K 4.8 4.7 4.4 3.7  CL 99 102 103 102  CO2 19*  --  20* 19*  GLUCOSE 148* 141* 135* 130*  BUN 27* 31* 28* 29*  CREATININE 2.29* 2.20* 1.88* 1.72*  CALCIUM 10.0  --  9.2 9.6   Liver Function Tests: Recent Labs  Lab 04/24/23 1613  AST 20  ALT 16  ALKPHOS 79  BILITOT 2.1*  PROT 7.0  ALBUMIN 3.8   CBG: Recent Labs  Lab 04/25/23 0129 04/25/23 0359 04/25/23 0823 04/25/23 1125  GLUCAP 122* 115* 155* 158*    Discharge time spent: less than 30 minutes.  Signed: Clydie Braun, MD [facility] 04/26/2023

## 2023-04-26 NOTE — ED Triage Notes (Signed)
Pt states his urine is leaking around the foley catheter from his penis. Pt states he's been having the hematuria

## 2023-04-26 NOTE — ED Triage Notes (Signed)
Patient came POV from home with a complaint of urinary retention.  Reports ureter stent placed 04/25/23, was able to urinate until 1700. Initial urination bloody.  Pain 4/10

## 2023-04-26 NOTE — ED Notes (Signed)
Patient came to nurses station and informed this RN that he was leaving. Explained to patient risks of leaving prior to treatment complete. Patient verbalized understanding. This Rn visualized patient leave facility.

## 2023-04-26 NOTE — ED Triage Notes (Signed)
Reports blood leaking from foley cath that was place earlier this morning. Reports some pressure in bladder area. Catheter is not attached to bag, noticed some blood with not much drainage from catheter

## 2023-04-26 NOTE — ED Provider Notes (Signed)
[facility] EMERGENCY DEPARTMENT AT [facility] Provider Note   CSN: [id number] Arrival date & time: 04/26/23  0551     History  Chief Complaint  Patient presents with   Urinary Retention    Garrett Jordan Sr. is a 82 y.o. male.  Garrett ARRIETA Sr. is a 82 y.o. male with history of AAA s/p repair, CAD, hypertension, hyperlipidemia, diabetes, who was seen yesterday for flank pain and found to have a large renal stone with associated infection.  He had a stent placed yesterday by Dr. Marlou Porch with urology, they did not laser or remove the stone but stent placed without complication.  Patient is on Eliquis.  He reports that he was discharged after the procedure and had no difficulty peeing initially and was able to urinate multiple times but since 6:30 PM last night he has been unable to pass any urine and has had blood dripping from his urethral meatus.  He reports a pressure sensation in his groin and lower abdomen and feeling that he needs to urinate but he is unable to.  Denies history of prior urinary retention, does have prior history of prostate cancer.   The history is provided by the patient.       Home Medications Prior to Admission medications   Medication Sig Start Date End Date Taking? Authorizing Provider  ACCU-CHEK GUIDE test strip USE TWICE DAILY 05/29/22   Shamleffer, Konrad Dolores, MD  acetaminophen (TYLENOL) 650 MG CR tablet Take 650 mg by mouth every 8 (eight) hours as needed for pain.    [provider]  allopurinol (ZYLOPRIM) 300 MG tablet Take 300 mg by mouth every other day. on even days of the month    [provider]  amLODipine (NORVASC) 10 MG tablet Take 1 tablet (10 mg total) by mouth daily. 12/15/22   Jake Bathe, MD  apixaban (ELIQUIS) 5 MG TABS tablet Take 1 tablet (5 mg total) by mouth 2 (two) times daily. 02/27/23   Jake Bathe, MD  Ascorbic Acid (VITAMIN C) 500 MG CAPS Take 500 mg by mouth daily at 6 (six) AM.    [provider]  atorvastatin (LIPITOR) 40 MG tablet Take 1 tablet (40 mg total) by mouth daily. 06/18/22   Barrett, Joline Salt, PA-C  Blood Pressure Monitoring (OMRON 3 SERIES BP MONITOR) DEVI 1 each by Does not apply route daily. 01/16/23   Jake Bathe, MD  carvedilol (COREG) 6.25 MG tablet Take 1 tablet (6.25 mg total) by mouth 2 (two) times daily. 02/21/23   Swinyer, Zachary George, NP  Cholecalciferol (VITAMIN D3) 2000 UNITS TABS Take 2,000 Units by mouth daily.     [provider]  clonazePAM (KLONOPIN) 1 MG tablet Take 0.5 mg by mouth at bedtime.    [provider]  clopidogrel (PLAVIX) 75 MG tablet Take 1 tablet (75 mg total) by mouth daily. 01/22/23   Jake Bathe, MD  Coenzyme Q10 300 MG CAPS Take 300 mg by mouth daily.    [provider]  dapagliflozin propanediol (FARXIGA) 10 MG TABS tablet Take 1 tablet (10 mg total) by mouth daily before breakfast. 03/22/23   Shamleffer, Konrad Dolores, MD  ezetimibe (ZETIA) 10 MG tablet TAKE 1 TABLET BY MOUTH DAILY 04/17/23   Orbie Pyo, MD  fenofibrate micronized (LOFIBRA) 200 MG capsule TAKE 1 CAPSULE BY MOUTH DAILY  BEFORE BREAKFAST Patient taking differently: Take 200 mg by mouth daily before breakfast. 12/18/22   Shamleffer,  Konrad Dolores, MD  fluorouracil (EFUDEX) 5 % cream Apply 1 Application topically daily as needed (cancer spots). Use as directed    [provider]  fluticasone (FLONASE) 50 MCG/ACT nasal spray Place 2 sprays into both nostrils daily as needed for allergies.    [provider]  glipiZIDE (GLUCOTROL) 5 MG tablet Take 0.5 tablets (2.5 mg total) by mouth 2 (two) times daily before a meal. 03/22/23   Shamleffer, Konrad Dolores, MD  isosorbide mononitrate (IMDUR) 30 MG 24 hr tablet Take 1 tablet (30 mg total) by mouth daily. 12/06/22   Jake Bathe, MD  levothyroxine (SYNTHROID, LEVOTHROID) 175 MCG tablet Take 175 mcg by mouth daily before breakfast.    [provider]  lidocaine  (ASPERCREME LIDOCAINE) 4 % Place 1 patch onto the skin daily as needed (pain).    [provider]  loratadine (CLARITIN) 10 MG tablet Take 10 mg by mouth at bedtime.    [provider]  magnesium oxide (MAG-OX) 400 (240 Mg) MG tablet Take 400 mg by mouth daily.    [provider]  Multiple Vitamins-Minerals (PRESERVISION AREDS 2) CAPS Take 1 capsule by mouth 2 (two) times daily.    [provider]  nitroGLYCERIN (NITROSTAT) 0.4 MG SL tablet Place 1 tablet (0.4 mg total) under the tongue every 5 (five) minutes as needed for chest pain. 06/17/22   Barrett, Joline Salt, PA-C  olmesartan (BENICAR) 40 MG tablet Take 1 tablet (40 mg total) by mouth daily. 03/05/23   Jake Bathe, MD  oxyCODONE 10 MG TABS Take 1 tablet (10 mg total) by mouth every 4 (four) hours as needed for severe pain ((score 7 to 10)). 11/11/22   Arman Bogus, MD  pantoprazole (PROTONIX) 40 MG tablet TAKE 1 TABLET BY MOUTH DAILY Patient taking differently: Take 40 mg by mouth daily. 08/07/22   Jake Bathe, MD  Plant Sterols and Stanols (CHOLESTOFF PLUS PO) Take 1 capsule by mouth daily.    [provider]  Polyethyl Glycol-Propyl Glycol (SYSTANE OP) Place 1 drop into both eyes daily as needed (dry/irritated eyes).    [provider]  sulfamethoxazole-trimethoprim (BACTRIM) 400-80 MG tablet Take 1 tablet by mouth every 12 (twelve) hours for 14 days. 04/25/23 05/09/23  Clydie Braun, MD  Tamsulosin HCl (FLOMAX) 0.4 MG CAPS Take 0.4 mg by mouth every other day.    [provider]  triamcinolone cream (KENALOG) 0.1 % Apply 1 application  topically daily as needed (rash). 02/07/18   [provider]  TURMERIC PO Take 2 tablets by mouth in the morning and at bedtime.    [provider]  VASCEPA 1 g capsule TAKE 2 CAPSULES BY MOUTH TWICE A DAY 04/06/23   Jake Bathe, MD      Allergies    Ace inhibitors, Crestor [rosuvastatin], Nitroglycerin, and Quinolones     Review of Systems   Review of Systems  Constitutional:  Negative for chills and fever.  Genitourinary:  Positive for difficulty urinating and hematuria. Negative for dysuria and frequency.    Physical Exam Updated Vital Signs BP (!) 151/95 (BP Location: Right Arm)   Pulse 81   Temp 98.9 F (37.2 C)   Resp 18   SpO2 98%  Physical Exam Vitals and nursing note reviewed.  Constitutional:      General: He is not in acute distress.    Appearance: Normal appearance. He is well-developed and normal weight. He is not ill-appearing or diaphoretic.  HENT:     Head: Normocephalic and atraumatic.  Eyes:     General:        Right eye: No discharge.        Left eye: No discharge.  Cardiovascular:     Rate and Rhythm: Normal rate and regular rhythm.     Heart sounds: Normal heart sounds.  Pulmonary:     Effort: Pulmonary effort is normal. No respiratory distress.     Breath sounds: Normal breath sounds.  Abdominal:     General: Bowel sounds are normal. There is distension.     Palpations: Abdomen is soft.     Tenderness: There is abdominal tenderness.     Comments: Abdomen soft, bowel sounds present throughout, tenderness and palpable distended bladder noted in the suprapubic region, abdomen otherwise nontender to palpation, no flank tenderness.  Genitourinary:    Comments: Small amount of dark blood dripping from the urethral meatus without signs of associated trauma, no testicular swelling or tenderness. Neurological:     Mental Status: He is alert and oriented to person, place, and time.     Coordination: Coordination normal.  Psychiatric:        Mood and Affect: Mood normal.        Behavior: Behavior normal.    ED Results / Procedures / Treatments   Labs (all labs ordered are listed, but only abnormal results are displayed) Labs Reviewed  BASIC METABOLIC PANEL - Abnormal; Notable for the following components:      Result Value   CO2 19 (*)    Glucose, Bld 130 (*)    BUN  29 (*)    Creatinine, Ser 1.72 (*)    GFR, Estimated 40 (*)    All other components within normal limits  CBC - Abnormal; Notable for the following components:   WBC 3.0 (*)    RBC 4.20 (*)    Hemoglobin 12.3 (*)    HCT 37.2 (*)    Platelets 121 (*)    All other components within normal limits  URINALYSIS, ROUTINE W REFLEX MICROSCOPIC    EKG None  Radiology CT Renal Stone Study  Result Date: 04/26/2023 CLINICAL DATA:  Status post right ureteral stent placement 04/25/2023 now with urinary retention EXAM: CT ABDOMEN AND PELVIS WITHOUT CONTRAST TECHNIQUE: Multidetector CT imaging of the abdomen and pelvis was performed following the standard protocol without IV contrast. RADIATION DOSE REDUCTION: This exam was performed according to the departmental dose-optimization program which includes automated exposure control, adjustment of the mA and/or kV according to patient size and/or use of iterative reconstruction technique. COMPARISON:  CT abdomen and pelvis dated 04/24/2023 FINDINGS: Lower chest: No focal consolidation or pulmonary nodule in the lung bases. New trace bilateral pleural effusions. Partially imaged heart size is normal. Coronary artery calcifications. Hepatobiliary: No focal hepatic lesions. No intra or extrahepatic biliary ductal dilation. Cholelithiasis. New mild surrounding stranding. Pancreas: No focal lesions or main ductal dilation. Spleen: Enlarged in the AP dimension, 15.1 cm, previously 14.1 cm. Adrenals/Urinary Tract: No adrenal nodules. Right ureteral stent in-situ with proximal pigtail in the upper pole calyces and distal pigtail within the urinary bladder. Similar position of 6 mm stone within the distal right ureter adjacent to the stent (3:66). Unchanged 5 mm stone within the left renal pelvis (3:37). Unchanged additional nonobstructing stone within the left upper pole. No hydronephrosis. Right interpolar simple-appearing cysts. No specific follow-up imaging recommended.  Urinary bladder is decompressed with catheter in-situ and contains small volume air. Stomach/Bowel: Normal  appearance of the stomach. No evidence of bowel wall thickening, distention, or inflammatory changes. Normal appendix. Vascular/Lymphatic: Aortic atherosclerosis. Prior right iliac embolization. Aorto bi-iliac stent in-situ. Infrarenal abdominal aortic aneurysm measures 6.8 x 4.9 cm, unchanged. 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 Reproductive: Normal prostate size.  Brachytherapy seeds. Other: Small volume pelvic free fluid. No free air or fluid collection. Musculoskeletal: No acute or abnormal lytic or blastic osseous lesions. Prior L2-3 spinal fixation. Hardware appears intact. Multilevel degenerative changes of the partially imaged thoracic and lumbar spine. Small fat-containing bilateral inguinal and paraumbilical hernias. IMPRESSION: 1. Right ureteral stent in-situ with proximal pigtail in the upper pole calyces and distal pigtail within the urinary bladder. Similar position of 6 mm stone within the distal right ureter adjacent to the stent. 2. Unchanged 5 mm stone within the left renal pelvis. Additional left nephrolithiasis. 3. Cholelithiasis with new mild surrounding stranding, which may be secondary to overall volume status. Recommend correlation with physical examination for right upper quadrant tenderness. 4. New trace bilateral pleural effusions. 5. Unchanged infrarenal abdominal aortic aneurysm measuring up to 6.8 cm with aorto bi-iliac stent in-situ. Recommend clinical follow-up as per vascular specialist. 6. Splenomegaly. 7. Aortic Atherosclerosis (ICD10-I70.0). Coronary artery calcifications. Assessment for potential risk factor modification, dietary therapy or pharmacologic therapy may be warranted, if clinically indicated. Electronically Signed   By: Agustin Cree M.D.   On: 04/26/2023 10:00   Procedures Procedures    Medications Ordered in ED Medications - No  data to display  ED Course/ Medical Decision Making/ A&P                             Medical Decision Making Amount and/or Complexity of Data Reviewed Labs: ordered. Radiology: ordered.   82 y.o. male presents to the ED with complaints of urinary retention, this involves an extensive number of treatment options, and is a complaint that carries with it a high risk of complications and morbidity.  Patient had a ureteral stent placed yesterday for a large ureteral stone, stone retrieval or any further intervention was not performed during this procedure as patient is on Eliquis.  UA with bacteriuria, patient placed on Bactrim.  The differential diagnosis includes bladder spasm from ureteral stent placement, hematuria with clots leading to urinary retention, BPH  On arrival pt is nontoxic, vitals WNL.   Additional history obtained from chart review. Previous records obtained and reviewed including records from ED encounter yesterday and operative note from stent placement by Dr. Marlou Porch with urology  Lab Tests:  I Ordered, reviewed, and interpreted labs, which included: Stable hemoglobin, mild leukopenia, creatinine improved slightly from yesterday, no significant electrolyte derangements.  Urinalysis with large amount of blood present as well as positive nitrites and leukocytes.  Imaging Studies ordered:  I ordered imaging studies which included CT renal stone study, I independently visualized and interpreted imaging which showed shows right ureteral stent in place unchanged position of the right ureteral stone.  ED Course:   Bladder scan showed greater than 692 mL of urine present in the bladder.  Nursing staff able to insert Foley catheter, multiple blood clots passed with irrigation and then urine was flowing continuously.  Urine progressively clearing from initially dark red to pink-tinged.  After urinary retention relieved patient is more Comfortable.  I discussed case with on-call  urologist Dr. Cardell Peach given that obstruction has resolved and he recommends patient be discharged with catheter in place, contacted  urology APP to ensure close follow-up in the urology office.  Patient provided instructions on catheter care and given return precautions.  Discharged home in good condition.    Portions of this note were generated with Scientist, clinical (histocompatibility and immunogenetics). Dictation errors may occur despite best attempts at proofreading.         Final Clinical Impression(s) / ED Diagnoses Final diagnoses:  Urinary retention  Hematuria, unspecified type    Rx / DC Orders ED Discharge Orders     None         Legrand Rams 05/10/23 1610    Arby Barrette, MD 05/15/23 [phone]

## 2023-04-27 ENCOUNTER — Encounter (HOSPITAL_COMMUNITY): Payer: Self-pay | Admitting: Family Medicine

## 2023-04-27 DIAGNOSIS — N136 Pyonephrosis: Secondary | ICD-10-CM | POA: Diagnosis not present

## 2023-04-27 DIAGNOSIS — N3001 Acute cystitis with hematuria: Secondary | ICD-10-CM | POA: Diagnosis not present

## 2023-04-27 DIAGNOSIS — E1141 Type 2 diabetes mellitus with diabetic mononeuropathy: Secondary | ICD-10-CM | POA: Diagnosis not present

## 2023-04-27 DIAGNOSIS — E871 Hypo-osmolality and hyponatremia: Secondary | ICD-10-CM | POA: Diagnosis not present

## 2023-04-27 DIAGNOSIS — N1832 Chronic kidney disease, stage 3b: Secondary | ICD-10-CM | POA: Diagnosis not present

## 2023-04-27 DIAGNOSIS — I480 Paroxysmal atrial fibrillation: Secondary | ICD-10-CM | POA: Diagnosis not present

## 2023-04-27 DIAGNOSIS — E785 Hyperlipidemia, unspecified: Secondary | ICD-10-CM | POA: Diagnosis not present

## 2023-04-27 DIAGNOSIS — E1165 Type 2 diabetes mellitus with hyperglycemia: Secondary | ICD-10-CM | POA: Diagnosis not present

## 2023-04-27 DIAGNOSIS — T45515A Adverse effect of anticoagulants, initial encounter: Secondary | ICD-10-CM | POA: Diagnosis not present

## 2023-04-27 DIAGNOSIS — E039 Hypothyroidism, unspecified: Secondary | ICD-10-CM

## 2023-04-27 DIAGNOSIS — R310 Gross hematuria: Secondary | ICD-10-CM

## 2023-04-27 DIAGNOSIS — R197 Diarrhea, unspecified: Secondary | ICD-10-CM | POA: Diagnosis not present

## 2023-04-27 DIAGNOSIS — N201 Calculus of ureter: Secondary | ICD-10-CM | POA: Diagnosis not present

## 2023-04-27 DIAGNOSIS — M170 Bilateral primary osteoarthritis of knee: Secondary | ICD-10-CM | POA: Diagnosis not present

## 2023-04-27 DIAGNOSIS — I10 Essential (primary) hypertension: Secondary | ICD-10-CM | POA: Diagnosis not present

## 2023-04-27 DIAGNOSIS — N132 Hydronephrosis with renal and ureteral calculous obstruction: Secondary | ICD-10-CM | POA: Diagnosis not present

## 2023-04-27 DIAGNOSIS — I25810 Atherosclerosis of coronary artery bypass graft(s) without angina pectoris: Secondary | ICD-10-CM | POA: Diagnosis not present

## 2023-04-27 DIAGNOSIS — Y842 Radiological procedure and radiotherapy as the cause of abnormal reaction of the patient, or of later complication, without mention of misadventure at the time of the procedure: Secondary | ICD-10-CM | POA: Diagnosis not present

## 2023-04-27 DIAGNOSIS — D6832 Hemorrhagic disorder due to extrinsic circulating anticoagulants: Secondary | ICD-10-CM | POA: Diagnosis not present

## 2023-04-27 DIAGNOSIS — E1122 Type 2 diabetes mellitus with diabetic chronic kidney disease: Secondary | ICD-10-CM | POA: Diagnosis not present

## 2023-04-27 DIAGNOSIS — I2510 Atherosclerotic heart disease of native coronary artery without angina pectoris: Secondary | ICD-10-CM

## 2023-04-27 DIAGNOSIS — I4510 Unspecified right bundle-branch block: Secondary | ICD-10-CM | POA: Diagnosis not present

## 2023-04-27 DIAGNOSIS — Z7901 Long term (current) use of anticoagulants: Secondary | ICD-10-CM | POA: Diagnosis not present

## 2023-04-27 DIAGNOSIS — R339 Retention of urine, unspecified: Secondary | ICD-10-CM | POA: Diagnosis not present

## 2023-04-27 DIAGNOSIS — D72819 Decreased white blood cell count, unspecified: Secondary | ICD-10-CM | POA: Diagnosis not present

## 2023-04-27 DIAGNOSIS — E8721 Acute metabolic acidosis: Secondary | ICD-10-CM | POA: Diagnosis not present

## 2023-04-27 DIAGNOSIS — E876 Hypokalemia: Secondary | ICD-10-CM | POA: Diagnosis not present

## 2023-04-27 DIAGNOSIS — D696 Thrombocytopenia, unspecified: Secondary | ICD-10-CM | POA: Diagnosis not present

## 2023-04-27 DIAGNOSIS — I129 Hypertensive chronic kidney disease with stage 1 through stage 4 chronic kidney disease, or unspecified chronic kidney disease: Secondary | ICD-10-CM | POA: Diagnosis not present

## 2023-04-27 DIAGNOSIS — N390 Urinary tract infection, site not specified: Secondary | ICD-10-CM | POA: Diagnosis present

## 2023-04-27 DIAGNOSIS — Z95828 Presence of other vascular implants and grafts: Secondary | ICD-10-CM | POA: Diagnosis not present

## 2023-04-27 LAB — TYPE AND SCREEN
ABO/RH(D): O POS
Antibody Screen: NEGATIVE

## 2023-04-27 LAB — CBC WITH DIFFERENTIAL/PLATELET
Abs Immature Granulocytes: 0.02 10*3/uL (ref 0.00–0.07)
Basophils Absolute: 0 10*3/uL (ref 0.0–0.1)
Basophils Relative: 0 %
Eosinophils Absolute: 0 10*3/uL (ref 0.0–0.5)
Eosinophils Relative: 0 %
HCT: 34.8 % — ABNORMAL LOW (ref 39.0–52.0)
Hemoglobin: 12.1 g/dL — ABNORMAL LOW (ref 13.0–17.0)
Immature Granulocytes: 1 %
Lymphocytes Relative: 8 %
Lymphs Abs: 0.3 10*3/uL — ABNORMAL LOW (ref 0.7–4.0)
MCH: 29.9 pg (ref 26.0–34.0)
MCHC: 34.8 g/dL (ref 30.0–36.0)
MCV: 85.9 fL (ref 80.0–100.0)
Monocytes Absolute: 0.5 10*3/uL (ref 0.1–1.0)
Monocytes Relative: 12 %
Neutro Abs: 3.3 10*3/uL (ref 1.7–7.7)
Neutrophils Relative %: 79 %
Platelets: 155 10*3/uL (ref 150–400)
RBC: 4.05 MIL/uL — ABNORMAL LOW (ref 4.22–5.81)
RDW: 13.6 % (ref 11.5–15.5)
WBC: 4.1 10*3/uL (ref 4.0–10.5)
nRBC: 0 % (ref 0.0–0.2)

## 2023-04-27 LAB — BASIC METABOLIC PANEL
Anion gap: 13 (ref 5–15)
Anion gap: 11 (ref 5–15)
BUN: 31 mg/dL — ABNORMAL HIGH (ref 8–23)
BUN: 27 mg/dL — ABNORMAL HIGH (ref 8–23)
CO2: 19 mmol/L — ABNORMAL LOW (ref 22–32)
CO2: 22 mmol/L (ref 22–32)
Calcium: 9.7 mg/dL (ref 8.9–10.3)
Calcium: 9.3 mg/dL (ref 8.9–10.3)
Chloride: 102 mmol/L (ref 98–111)
Chloride: 102 mmol/L (ref 98–111)
Creatinine, Ser: 1.67 mg/dL — ABNORMAL HIGH (ref 0.61–1.24)
Creatinine, Ser: 1.49 mg/dL — ABNORMAL HIGH (ref 0.61–1.24)
GFR, Estimated: 41 mL/min — ABNORMAL LOW (ref >60)
GFR, Estimated: 47 mL/min — ABNORMAL LOW (ref >60)
Glucose, Bld: 130 mg/dL — ABNORMAL HIGH (ref 70–99)
Glucose, Bld: 113 mg/dL — ABNORMAL HIGH (ref 70–99)
Potassium: 4 mmol/L (ref 3.5–5.1)
Potassium: 4.1 mmol/L (ref 3.5–5.1)
Sodium: 134 mmol/L — ABNORMAL LOW (ref 135–145)
Sodium: 135 mmol/L (ref 135–145)

## 2023-04-27 LAB — GLUCOSE, CAPILLARY
Glucose-Capillary: 167 mg/dL — ABNORMAL HIGH (ref 70–99)
Glucose-Capillary: 136 mg/dL — ABNORMAL HIGH (ref 70–99)
Glucose-Capillary: 204 mg/dL — ABNORMAL HIGH (ref 70–99)
Glucose-Capillary: 143 mg/dL — ABNORMAL HIGH (ref 70–99)

## 2023-04-27 LAB — CBC
HCT: 36.3 % — ABNORMAL LOW (ref 39.0–52.0)
Hemoglobin: 11.8 g/dL — ABNORMAL LOW (ref 13.0–17.0)
MCH: 29 pg (ref 26.0–34.0)
MCHC: 32.5 g/dL (ref 30.0–36.0)
MCV: 89.2 fL (ref 80.0–100.0)
Platelets: 140 10*3/uL — ABNORMAL LOW (ref 150–400)
RBC: 4.07 MIL/uL — ABNORMAL LOW (ref 4.22–5.81)
RDW: 13.6 % (ref 11.5–15.5)
WBC: 3.5 10*3/uL — ABNORMAL LOW (ref 4.0–10.5)
nRBC: 0 % (ref 0.0–0.2)

## 2023-04-27 LAB — C DIFFICILE QUICK SCREEN W PCR REFLEX
C Diff antigen: NEGATIVE
C Diff interpretation: NOT DETECTED
C Diff toxin: NEGATIVE

## 2023-04-27 LAB — CULTURE, BLOOD (ROUTINE X 2)

## 2023-04-27 MED ORDER — CLONAZEPAM 0.5 MG PO TABS
0.5000 mg | ORAL_TABLET | Freq: Every day | ORAL | Status: DC
  Administered 2023-04-27 – 2023-05-03 (×7): 0.5 mg via ORAL
  Filled 2023-04-27 (×7): qty 1

## 2023-04-27 MED ORDER — SODIUM CHLORIDE 0.9 % IR SOLN
3000.0000 mL | Status: DC
  Administered 2023-04-27: 3000 mL

## 2023-04-27 MED ORDER — FENTANYL CITRATE PF 50 MCG/ML IJ SOSY: 12.5000 ug | PREFILLED_SYRINGE | INTRAMUSCULAR | Status: DC | PRN

## 2023-04-27 MED ORDER — CLOPIDOGREL BISULFATE 75 MG PO TABS
75.0000 mg | ORAL_TABLET | Freq: Every day | ORAL | Status: DC
  Administered 2023-04-27: 75 mg via ORAL
  Filled 2023-04-27: qty 1

## 2023-04-27 MED ORDER — SODIUM CHLORIDE 0.9% FLUSH
3.0000 mL | Freq: Two times a day (BID) | INTRAVENOUS | Status: DC
  Administered 2023-04-27 – 2023-05-04 (×13): 3 mL via INTRAVENOUS

## 2023-04-27 MED ORDER — SODIUM CHLORIDE 0.9 % IV SOLN
1.0000 g | INTRAVENOUS | Status: DC
  Administered 2023-04-27 – 2023-05-03 (×7): 1 g via INTRAVENOUS
  Filled 2023-04-27 (×7): qty 10

## 2023-04-27 MED ORDER — IRBESARTAN 300 MG PO TABS
300.0000 mg | ORAL_TABLET | Freq: Every day | ORAL | Status: DC
  Administered 2023-04-27 – 2023-05-04 (×8): 300 mg via ORAL
  Filled 2023-04-27 (×8): qty 1

## 2023-04-27 MED ORDER — INSULIN ASPART 100 UNIT/ML IJ SOLN
0.0000 [IU] | Freq: Three times a day (TID) | INTRAMUSCULAR | Status: DC
  Administered 2023-04-27 (×2): 2 [IU] via SUBCUTANEOUS
  Administered 2023-04-28 (×2): 1 [IU] via SUBCUTANEOUS
  Administered 2023-04-29: 2 [IU] via SUBCUTANEOUS
  Administered 2023-04-30 – 2023-05-03 (×6): 1 [IU] via SUBCUTANEOUS
  Administered 2023-05-03: 2 [IU] via SUBCUTANEOUS

## 2023-04-27 MED ORDER — PANTOPRAZOLE SODIUM 40 MG PO TBEC
40.0000 mg | DELAYED_RELEASE_TABLET | Freq: Every day | ORAL | Status: DC
  Administered 2023-04-27 – 2023-05-03 (×7): 40 mg via ORAL
  Filled 2023-04-27 (×7): qty 1

## 2023-04-27 MED ORDER — LOPERAMIDE HCL 2 MG PO CAPS
4.0000 mg | ORAL_CAPSULE | Freq: Once | ORAL | Status: AC
  Administered 2023-04-27: 4 mg via ORAL
  Filled 2023-04-27: qty 2

## 2023-04-27 MED ORDER — AMLODIPINE BESYLATE 10 MG PO TABS
10.0000 mg | ORAL_TABLET | Freq: Every day | ORAL | Status: DC
  Administered 2023-04-27 – 2023-05-04 (×8): 10 mg via ORAL
  Filled 2023-04-27 (×8): qty 1

## 2023-04-27 MED ORDER — ONDANSETRON HCL 4 MG/2ML IJ SOLN
4.0000 mg | Freq: Once | INTRAMUSCULAR | Status: AC
  Administered 2023-04-27: 4 mg via INTRAVENOUS
  Filled 2023-04-27: qty 2

## 2023-04-27 MED ORDER — ACETAMINOPHEN 650 MG RE SUPP: 650.0000 mg | Freq: Four times a day (QID) | RECTAL | Status: DC | PRN

## 2023-04-27 MED ORDER — INSULIN ASPART 100 UNIT/ML IJ SOLN
0.0000 [IU] | Freq: Every day | INTRAMUSCULAR | Status: DC
  Administered 2023-04-29 – 2023-05-03 (×2): 3 [IU] via SUBCUTANEOUS

## 2023-04-27 MED ORDER — CHLORHEXIDINE GLUCONATE CLOTH 2 % EX PADS
6.0000 | MEDICATED_PAD | Freq: Every day | CUTANEOUS | Status: DC
  Administered 2023-04-27 – 2023-05-02 (×6): 6 via TOPICAL

## 2023-04-27 MED ORDER — ISOSORBIDE MONONITRATE ER 30 MG PO TB24
30.0000 mg | ORAL_TABLET | Freq: Every day | ORAL | Status: DC
  Administered 2023-04-27 – 2023-05-04 (×8): 30 mg via ORAL
  Filled 2023-04-27 (×8): qty 1

## 2023-04-27 MED ORDER — CARVEDILOL 6.25 MG PO TABS
6.2500 mg | ORAL_TABLET | Freq: Two times a day (BID) | ORAL | Status: DC
  Administered 2023-04-27 – 2023-05-04 (×15): 6.25 mg via ORAL
  Filled 2023-04-27 (×15): qty 1

## 2023-04-27 MED ORDER — ATORVASTATIN CALCIUM 40 MG PO TABS
40.0000 mg | ORAL_TABLET | Freq: Every day | ORAL | Status: DC
  Administered 2023-04-27 – 2023-05-04 (×8): 40 mg via ORAL
  Filled 2023-04-27 (×8): qty 1

## 2023-04-27 MED ORDER — LEVOTHYROXINE SODIUM 50 MCG PO TABS
175.0000 ug | ORAL_TABLET | Freq: Every day | ORAL | Status: DC
  Administered 2023-04-27 – 2023-05-04 (×6): 175 ug via ORAL
  Filled 2023-04-27 (×6): qty 1

## 2023-04-27 MED ORDER — FENTANYL CITRATE PF 50 MCG/ML IJ SOSY
50.0000 ug | PREFILLED_SYRINGE | Freq: Once | INTRAMUSCULAR | Status: AC
  Administered 2023-04-27: 50 ug via INTRAVENOUS
  Filled 2023-04-27: qty 1

## 2023-04-27 MED ORDER — SODIUM CHLORIDE 0.9 % IR SOLN
3000.0000 mL | Status: DC
  Administered 2023-04-27 (×2): 3000 mL
  Filled 2023-04-27: qty 3000

## 2023-04-27 MED ORDER — ACETAMINOPHEN 325 MG PO TABS
650.0000 mg | ORAL_TABLET | Freq: Four times a day (QID) | ORAL | Status: DC | PRN
  Administered 2023-04-27 – 2023-05-04 (×10): 650 mg via ORAL
  Filled 2023-04-27 (×11): qty 2

## 2023-04-27 MED ORDER — LACTATED RINGERS IV BOLUS
1000.0000 mL | Freq: Once | INTRAVENOUS | Status: AC
  Administered 2023-04-27: 1000 mL via INTRAVENOUS

## 2023-04-27 NOTE — ED Notes (Signed)
[facility] arrival at [facility] ED.

## 2023-04-27 NOTE — ED Provider Notes (Signed)
[place] [facility]PARTMENT AT [facility] Provider Note   CSN: [id number] Arrival date & time: 04/26/23  2313     History  No chief complaint on file.   Garrett LIZANO Sr. is a 82 y.o. male.  Patient is an 82 y.o. male with history of AAA s/p repair, CAD, hypertension, hyperlipidemia, diabetes, who was seen yesterday for flank pain and found to have a large renal stone with associated infection.  He had a stent placed yesterday by Dr. Marlou Porch with urology. He was seen at [facility] this morning for urinary retention and sent home with a Foley catheter.  He returns today with leaking Foley catheter.  States he got home from colon about 10 AM and took a nap.  He woke up and noticed there was leaking from the catheter connection with the bag as well as from his penis.  States there is bloody urine both coming from his penis and from the end of the catheter.  States there is been no drainage of urine except for drips earlier today.  Has abdominal distention and pain and feels like he needs to urinate.  Denies chest pain, nausea, vomiting, fever since he was hospitalized.  He is currently on Bactrim for a UTI and infected stone has developed diarrhea today which is new.  Estimates he has gone about 10 times since this morning. He has had urinary retention in the past in the setting of his prostate surgery but not otherwise.  He does take Eliquis as well as Plavix.  The history is provided by the patient.       Home Medications Prior to Admission medications   Medication Sig Start Date End Date Taking? Authorizing Provider  ACCU-CHEK GUIDE test strip USE TWICE DAILY 05/29/22   Shamleffer, Konrad Dolores, MD  acetaminophen (TYLENOL) 650 MG CR tablet Take 650 mg by mouth 2 (two) times daily as needed for pain.    [provider]  allopurinol (ZYLOPRIM) 300 MG tablet Take 300 mg by mouth every other day. on even days of the month    [provider]  amLODipine  (NORVASC) 10 MG tablet Take 1 tablet (10 mg total) by mouth daily. 12/15/22   Jake Bathe, MD  apixaban (ELIQUIS) 5 MG TABS tablet Take 1 tablet (5 mg total) by mouth 2 (two) times daily. 02/27/23   Jake Bathe, MD  Ascorbic Acid (VITAMIN C) 500 MG CAPS Take 500 mg by mouth daily at 6 (six) AM.    [provider]  atorvastatin (LIPITOR) 40 MG tablet Take 1 tablet (40 mg total) by mouth daily. 06/18/22   Barrett, Joline Salt, PA-C  Blood Pressure Monitoring (OMRON 3 SERIES BP MONITOR) DEVI 1 each by Does not apply route daily. 01/16/23   Jake Bathe, MD  carvedilol (COREG) 6.25 MG tablet Take 1 tablet (6.25 mg total) by mouth 2 (two) times daily. 02/21/23   Swinyer, Zachary George, NP  Cholecalciferol (VITAMIN D3) 2000 UNITS TABS Take 2,000 Units by mouth daily.     [provider]  ciprofloxacin (CIPRO) 250 MG tablet Take 250 mg by mouth every 12 (twelve) hours. Patient not taking: Reported on 04/26/2023 04/23/23   [provider]  clonazePAM (KLONOPIN) 1 MG tablet Take 0.5 mg by mouth at bedtime.    [provider]  clopidogrel (PLAVIX) 75 MG tablet Take 1 tablet (75 mg total) by mouth daily. 01/22/23   Jake Bathe, MD  Coenzyme Q10 300 MG  CAPS Take 300 mg by mouth daily.    [provider]  dapagliflozin propanediol (FARXIGA) 10 MG TABS tablet Take 1 tablet (10 mg total) by mouth daily before breakfast. 03/22/23   Shamleffer, Konrad Dolores, MD  ezetimibe (ZETIA) 10 MG tablet TAKE 1 TABLET BY MOUTH DAILY 04/17/23   Orbie Pyo, MD  fenofibrate micronized (LOFIBRA) 200 MG capsule TAKE 1 CAPSULE BY MOUTH DAILY  BEFORE BREAKFAST Patient taking differently: Take 200 mg by mouth daily before breakfast. 12/18/22   Shamleffer, Konrad Dolores, MD  fluorouracil (EFUDEX) 5 % cream Apply 1 Application topically daily as needed (cancer spots). Use as directed    [provider]  fluticasone (FLONASE) 50 MCG/ACT nasal spray Place 2 sprays into both nostrils  daily as needed for allergies.    [provider]  glipiZIDE (GLUCOTROL) 5 MG tablet Take 0.5 tablets (2.5 mg total) by mouth 2 (two) times daily before a meal. 03/22/23   Shamleffer, Konrad Dolores, MD  isosorbide mononitrate (IMDUR) 30 MG 24 hr tablet Take 1 tablet (30 mg total) by mouth daily. 12/06/22   Jake Bathe, MD  levothyroxine (SYNTHROID, LEVOTHROID) 175 MCG tablet Take 175 mcg by mouth daily before breakfast.    [provider]  lidocaine (ASPERCREME LIDOCAINE) 4 % Place 1 patch onto the skin daily as needed (pain). Patient not taking: Reported on 04/26/2023    [provider]  loratadine (CLARITIN) 10 MG tablet Take 10 mg by mouth at bedtime.    [provider]  magnesium oxide (MAG-OX) 400 (240 Mg) MG tablet Take 400 mg by mouth daily.    [provider]  Multiple Vitamins-Minerals (PRESERVISION AREDS 2) CAPS Take 1 capsule by mouth 2 (two) times daily.    [provider]  nitroGLYCERIN (NITROSTAT) 0.4 MG SL tablet Place 1 tablet (0.4 mg total) under the tongue every 5 (five) minutes as needed for chest pain. 06/17/22   Barrett, Joline Salt, PA-C  olmesartan (BENICAR) 40 MG tablet Take 1 tablet (40 mg total) by mouth daily. 03/05/23   Jake Bathe, MD  oxyCODONE 10 MG TABS Take 1 tablet (10 mg total) by mouth every 4 (four) hours as needed for severe pain ((score 7 to 10)). Patient not taking: Reported on 04/26/2023 11/11/22   Arman Bogus, MD  pantoprazole (PROTONIX) 40 MG tablet TAKE 1 TABLET BY MOUTH DAILY Patient taking differently: Take 40 mg by mouth at bedtime. 08/07/22   Jake Bathe, MD  Plant Sterols and Stanols (CHOLESTOFF PLUS PO) Take 1 capsule by mouth daily.    [provider]  Polyethyl Glycol-Propyl Glycol (SYSTANE OP) Place 1 drop into both eyes daily as needed (dry/irritated eyes).    [provider]  sulfamethoxazole-trimethoprim (BACTRIM) 400-80 MG tablet Take 1 tablet by mouth every 12  (twelve) hours for 14 days. 04/25/23 05/09/23  Clydie Braun, MD  Tamsulosin HCl (FLOMAX) 0.4 MG CAPS Take 0.4 mg by mouth every other day.    [provider]  triamcinolone cream (KENALOG) 0.1 % Apply 1 application  topically daily as needed (rash). 02/07/18   [provider]  TURMERIC PO Take 2 tablets by mouth in the morning and at bedtime. Patient not taking: Reported on 04/26/2023    [provider]  VASCEPA 1 g capsule TAKE 2 CAPSULES BY MOUTH TWICE A DAY 04/06/23   Jake Bathe, MD      Allergies    Ace inhibitors, Crestor [rosuvastatin], Nitroglycerin, and Quinolones  Review of Systems   Review of Systems  Constitutional:  Negative for activity change, appetite change and fever.  HENT:  Negative for congestion.   Respiratory:  Negative for cough, chest tightness and shortness of breath.   Gastrointestinal:  Positive for abdominal pain. Negative for nausea and vomiting.  Genitourinary:  Positive for decreased urine volume, difficulty urinating, dysuria, flank pain, frequency, hematuria and urgency. Negative for testicular pain.  Musculoskeletal:  Positive for back pain. Negative for arthralgias and myalgias.  Skin:  Negative for rash.  Neurological:  Negative for dizziness, weakness and headaches.   all other systems are negative except as noted in the HPI and PMH.    Physical Exam Updated Vital Signs BP (!) 151/101 (BP Location: Right Arm)   Pulse 85   Temp 97.7 F (36.5 C) (Oral)   Resp 20   SpO2 96%  Physical Exam Vitals and nursing note reviewed.  Constitutional:      General: He is not in acute distress.    Appearance: He is well-developed.  HENT:     Head: Normocephalic and atraumatic.     Mouth/Throat:     Pharynx: No oropharyngeal exudate.  Eyes:     Conjunctiva/sclera: Conjunctivae normal.     Pupils: Pupils are equal, round, and reactive to light.  Neck:     Comments: No meningismus. Cardiovascular:     Rate and Rhythm:  Normal rate and regular rhythm.     Heart sounds: Normal heart sounds. No murmur heard. Pulmonary:     Effort: Pulmonary effort is normal. No respiratory distress.     Breath sounds: Normal breath sounds.  Abdominal:     Palpations: Abdomen is soft.     Tenderness: There is abdominal tenderness. There is no guarding or rebound.     Comments: Tender bladder  Genitourinary:    Comments: Foley catheter in place.  It is not attached to a drainage bag.  There is dried blood in the catheter but no drainage of urine. Musculoskeletal:        General: No tenderness. Normal range of motion.     Cervical back: Normal range of motion and neck supple.  Skin:    General: Skin is warm.  Neurological:     Mental Status: He is alert and oriented to person, place, and time.     Cranial Nerves: No cranial nerve deficit.     Motor: No abnormal muscle tone.     Coordination: Coordination normal.     Comments: No ataxia on finger to nose bilaterally. No pronator drift. 5/5 strength throughout. CN 2-12 intact.Equal grip strength. Sensation intact.   Psychiatric:        Behavior: Behavior normal.     ED Results / Procedures / Treatments   Labs (all labs ordered are listed, but only abnormal results are displayed) Labs Reviewed  BASIC METABOLIC PANEL - Abnormal; Notable for the following components:      Result Value   Sodium 134 (*)    CO2 19 (*)    Glucose, Bld 130 (*)    BUN 31 (*)    Creatinine, Ser 1.67 (*)    GFR, Estimated 41 (*)    All other components within normal limits  CBC WITH DIFFERENTIAL/PLATELET - Abnormal; Notable for the following components:   RBC 4.05 (*)    Hemoglobin 12.1 (*)    HCT 34.8 (*)    Lymphs Abs 0.3 (*)    All other components within normal limits  C  DIFFICILE QUICK SCREEN W PCR REFLEX    URINALYSIS, ROUTINE W REFLEX MICROSCOPIC    EKG None  Radiology CT Renal Stone Study  Result Date: 04/26/2023 CLINICAL DATA:  Status post right ureteral stent  placement 04/25/2023 now with urinary retention EXAM: CT ABDOMEN AND PELVIS WITHOUT CONTRAST TECHNIQUE: Multidetector CT imaging of the abdomen and pelvis was performed following the standard protocol without IV contrast. RADIATION DOSE REDUCTION: This exam was performed according to the departmental dose-optimization program which includes automated exposure control, adjustment of the mA and/or kV according to patient size and/or use of iterative reconstruction technique. COMPARISON:  CT abdomen and pelvis dated 04/24/2023 FINDINGS: Lower chest: No focal consolidation or pulmonary nodule in the lung bases. New trace bilateral pleural effusions. Partially imaged heart size is normal. Coronary artery calcifications. Hepatobiliary: No focal hepatic lesions. No intra or extrahepatic biliary ductal dilation. Cholelithiasis. New mild surrounding stranding. Pancreas: No focal lesions or main ductal dilation. Spleen: Enlarged in the AP dimension, 15.1 cm, previously 14.1 cm. Adrenals/Urinary Tract: No adrenal nodules. Right ureteral stent in-situ with proximal pigtail in the upper pole calyces and distal pigtail within the urinary bladder. Similar position of 6 mm stone within the distal right ureter adjacent to the stent (3:66). Unchanged 5 mm stone within the left renal pelvis (3:37). Unchanged additional nonobstructing stone within the left upper pole. No hydronephrosis. Right interpolar simple-appearing cysts. No specific follow-up imaging recommended. Urinary bladder is decompressed with catheter in-situ and contains small volume air. Stomach/Bowel: Normal appearance of the stomach. No evidence of bowel wall thickening, distention, or inflammatory changes. Normal appendix. Vascular/Lymphatic: Aortic atherosclerosis. Prior right iliac embolization. Aorto bi-iliac stent in-situ. Infrarenal abdominal aortic aneurysm measures 6.8 x 4.9 cm, unchanged. 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  Reproductive: Normal prostate size.  Brachytherapy seeds. Other: Small volume pelvic free fluid. No free air or fluid collection. Musculoskeletal: No acute or abnormal lytic or blastic osseous lesions. Prior L2-3 spinal fixation. Hardware appears intact. Multilevel degenerative changes of the partially imaged thoracic and lumbar spine. Small fat-containing bilateral inguinal and paraumbilical hernias. IMPRESSION: 1. Right ureteral stent in-situ with proximal pigtail in the upper pole calyces and distal pigtail within the urinary bladder. Similar position of 6 mm stone within the distal right ureter adjacent to the stent. 2. Unchanged 5 mm stone within the left renal pelvis. Additional left nephrolithiasis. 3. Cholelithiasis with new mild surrounding stranding, which may be secondary to overall volume status. Recommend correlation with physical examination for right upper quadrant tenderness. 4. New trace bilateral pleural effusions. 5. Unchanged infrarenal abdominal aortic aneurysm measuring up to 6.8 cm with aorto bi-iliac stent in-situ. Recommend clinical follow-up as per vascular specialist. 6. Splenomegaly. 7. Aortic Atherosclerosis (ICD10-I70.0). Coronary artery calcifications. Assessment for potential risk factor modification, dietary therapy or pharmacologic therapy may be warranted, if clinically indicated. Electronically Signed   By: Agustin Cree M.D.   On: 04/26/2023 10:00   DG C-Arm 1-60 Min  Result Date: 04/25/2023 CLINICAL DATA:  Retrograde urography, intraoperative examination EXAM: DG C-ARM 1-60 MIN CONTRAST:  Not listed, refer to operative report. FLUOROSCOPY: Fluoroscopy Time:  24 seconds Radiation Exposure Index (if provided by the fluoroscopic device): 4.42 mGy Number of Acquired Spot Images: 3 COMPARISON:  CT 04/24/2023 FINDINGS: Initial image demonstrates retrograde urography with a few small angular filling defects within the distal right ureter likely representing residual debris. The 6 mm  calculus noted on prior CT examination is not clearly identified, likely removed in the interval.  No hydroureter. Subsequent images demonstrate contrast opacification of the right pelvicaliceal system and deployment of a ureteral stent extending from the upper pole calices to the bladder. No definite extraluminal extension of contrast. IMPRESSION: 1. Retrograde urography with right ureteral stent placement. No definite extraluminal extension of contrast. Electronically Signed   By: Helyn Numbers M.D.   On: 04/25/2023 02:22    Procedures Procedures    Medications Ordered in ED Medications  lactated ringers bolus 1,000 mL (has no administration in time range)    ED Course/ Medical Decision Making/ A&P                             Medical Decision Making Amount and/or Complexity of Data Reviewed Labs: ordered. Decision-making details documented in ED Course. Radiology: ordered and independent interpretation performed. Decision-making details documented in ED Course. ECG/medicine tests: ordered and independent interpretation performed. Decision-making details documented in ED Course.  Risk Prescription drug management. Decision regarding hospitalization.   Leaking Foley catheter with concern for urinary retention.  Stable vitals, no distress.  Attempted to irrigate Foley catheter which was not successful.  Foley was replaced with a 22 [place] three-way catheter.  Patient having some gross hematuria with clots.  Hand irrigation performed.  Will attempt continuous bladder irrigation.  Creatinine improving to 1.67.  Hemoglobin stable at 12.1.  Discussed with urology Dr. Cardell Peach.  He states if no clots in the bladder as seen on CT scan no need for CBI.  Recommends continuous hand irrigation.  Would not be unreasonable to watch patient overnight given his anticoagulation use and recurrent presentation for same problem.  Patient states he does have a urology appointment at 9 AM  Discussed with  patient.  He is agreeable to observation admission overnight given recurrent urinary retention, gross hematuria and anticoagulation use.  Vital signs remained stable. Catheter is draining but is still quite bloody with clots.  Continue hand irrigation.  No clots seen on CT scan this morning.  Admission discussed with Dr. Margo Aye.  Message sent to Dr. Cardell Peach that patient has chosen to stay overnight.        Final Clinical Impression(s) / ED Diagnoses Final diagnoses:  Gross hematuria  Urinary retention    Rx / DC Orders ED Discharge Orders     None         Kathryne Ramella, Jeannett Senior, MD 04/27/23 [phone]

## 2023-04-27 NOTE — ED Notes (Signed)
Bladder Scan Volume Approx 

## 2023-04-27 NOTE — Progress Notes (Signed)
Patient admitted after midnight, please see H&P.  Here with hematuria.  Gross hematuria; UTI; ureteral stone  - Hold Eliquis, monitor for obstruction and hand irrigate if needed, send urine for culture, resume Rocephin   -urology consulted    PAF  - Hold Eliquis initially while monitoring gross hematuria, continue Coreg as tolerated     CAD  - No anginal complaints   - Continue statin -hold plavix    Type II DM  - A1c was 6.1% earlier this month  - SSI   Hypertension  - Continue Norvasc and Coreg       CKD 3B  - SCr is 1.67 on admission, appears close to baseline  - daily labs    Diarrhea  - C diff testing ordered   Marlin Canary DO

## 2023-04-27 NOTE — H&P (Signed)
History and Physical    Kazimir Devisser ZOX:[id number] DOB: [birth year] DOA: 04/26/2023  PCP: [name], MD   Patient coming from: Home   Chief Complaint: Bloody urine, suprapubic pain, abdominal distension, difficulty voiding   HPI: Garrett TINKHAM Sr. is a 82 y.o. male with medical history significant for hypertension, type 2 diabetes mellitus, CKD 3B, CAD, PAF on Eliquis, AAA, and recent admission for obstructing right ureteral stone complicated by sepsis secondary to pyelonephritis who now returns to the ED with gross hematuria, suprapubic pain, and difficulty voiding.  Patient was discharged from the hospital on 04/25/2023 after cystoscopy with right ureteral stent placement.  He went home on Bactrim and was doing well initially but woke from a nap with grossly bloody urine leaking around the catheter, suprapubic pain and abdominal distention, and feeling as though he needed to void but could not.  He reports roughly 10 episodes of diarrhea yesterday.  He has not had any fevers since the hospital discharge and denies any abdominal pain following irrigation of his Foley catheter in the ED.  He did not take his Eliquis last night.  [facility] ED Course: Upon arrival to the ED, patient is found to be afebrile and saturating well on room air with normal heart rate and stable blood pressure.  EKG demonstrates atrial fibrillation with RBBB.  Labs are notable for creatinine 1.67, normal WBC, and hemoglobin 12.1.  Urology (Dr. Cardell Peach) was consulted by the ED physician, Foley catheter was successfully hand irrigated, patient was given a liter of LR, fentanyl, Zofran, and Imodium, and he was transferred to [facility] hospital for admission.  Review of Systems:  All other systems reviewed and apart from HPI, are negative.  Past Medical History:  Diagnosis Date   AAA (abdominal aortic aneurysm) (HCC)    Arthritis    knees   Atrial fibrillation (HCC)    CAD (coronary artery disease)     Cancer (HCC)    prostate   CKD (chronic kidney disease), stage III (HCC) 06/18/2022   COVID    06/2019 and in 2022 states they were mild cases   Diabetes mellitus without complication (HCC)    Dyspnea    once in a while   Dysrhythmia    A-fib pt is on Eliquis   GERD (gastroesophageal reflux disease)    Gout    History of kidney stones    Hyperlipidemia    Hypertension    Hypothyroidism    Myocardial infarction (HCC) 01/1991   sp inferior   Nerve compression    right leg   Reflux    Seasonal allergies    Thoracic ascending aortic aneurysm (HCC)    Thyroid disease    hypothyroidism    Past Surgical History:  Procedure Laterality Date   ABDOMINAL AORTAGRAM N/A 02/28/2012   Procedure: ABDOMINAL Ronny Flurry;  Surgeon: Nada Libman, MD;  Location: [facility] CATH LAB;  Service: Cardiovascular;  Laterality: N/A;   abdominal aortagram embolization  02/28/2012   ABDOMINAL AORTIC ANEURYSM REPAIR  07/14/2012   CARDIAC CATHETERIZATION     CATARACT EXTRACTION Bilateral 2016   CORONARY ARTERY BYPASS GRAFT  04/13/2009   CORONARY BALLOON ANGIOPLASTY N/A 06/15/2022   Procedure: CORONARY BALLOON ANGIOPLASTY;  Surgeon: Tonny Bollman, MD;  Location: [facility] INVASIVE CV LAB;  Service: Cardiovascular;  Laterality: N/A;   CORONARY STENT PLACEMENT  1992 and  2009   RCA   CYSTOSCOPY W/ URETERAL STENT PLACEMENT Right 04/24/2023   Procedure: CYSTOSCOPY WITH RETROGRADE PYELOGRAM/URETERAL STENT  PLACEMENT;  Surgeon: Crist Fat, MD;  Location[facility] OR;  Service: Urology;  Laterality: Right;   EMBOLIZATION Right 02/28/2012   Procedure: EMBOLIZATION;  Surgeon: Nada Libman, MD;  Location: [facility] CATH LAB;  Service: Cardiovascular;  Laterality: Right;   IR GENERIC HISTORICAL  12/05/2016   IR RADIOLOGIST EVAL & MGMT 12/05/2016 Malachy Moan, MD [facility] INTERV RAD   IR RADIOLOGIST EVAL & MGMT  07/10/2019   IR RADIOLOGIST EVAL & MGMT  08/25/2020   IR RADIOLOGIST EVAL & MGMT  08/16/2021   IR RADIOLOGIST  EVAL & MGMT  08/31/2022   KIDNEY STONE SURGERY     LAMINECTOMY WITH POSTERIOR LATERAL ARTHRODESIS LEVEL 1 Bilateral 11/10/2022   Procedure: Lumbar Two-Lumbar Three laminectomy  - bilateral with posterior lateral fusion and interspinous plating;  Surgeon: Tia Alert, MD;  Location: [facility] OR;  Service: Neurosurgery;  Laterality: Bilateral;   LEFT HEART CATH AND CORONARY ANGIOGRAPHY N/A 06/15/2022   Procedure: LEFT HEART CATH AND CORONARY ANGIOGRAPHY;  Surgeon: Tonny Bollman, MD;  Location: [facility] INVASIVE CV LAB;  Service: Cardiovascular;  Laterality: N/A;   LEFT HEART CATHETERIZATION WITH CORONARY/GRAFT ANGIOGRAM N/A 01/08/2015   Procedure: LEFT HEART CATHETERIZATION WITH Isabel Caprice;  Surgeon: Othella Boyer, MD;  Location: [facility] CATH LAB;  Service: Cardiovascular;  Laterality: N/A;   parathyroid adenoma  1981   surgery   PROSTATE SURGERY     rad seeds   SPINE SURGERY  2006   TONSILLECTOMY  1955    Social History:   reports that he quit smoking about 52 years ago. His smoking use included cigarettes. He has never used smokeless tobacco. He reports current alcohol use of about 6.0 standard drinks of alcohol per week. He reports that he does not use drugs.  Allergies  Allergen Reactions   Ace Inhibitors Cough   Crestor [Rosuvastatin] Other (See Comments)    Muscle aches   Nitroglycerin Other (See Comments)    Very pronounced lowered BP with IV nitro for caths   Quinolones Other (See Comments)    Aortic root enlargement    Family History  Problem Relation Age of Onset   Heart disease Father        Heart Disease before age 2   Heart attack Father    Hyperlipidemia Father    Hypertension Father    Stroke Mother    Deep vein thrombosis Mother    Heart disease Sister    Diabetes Sister    Hyperlipidemia Sister    Hypertension Sister    Heart attack Sister    Heart disease Paternal Uncle    Diabetes Maternal Grandmother    Diabetes Sister      Prior to Admission  medications   Medication Sig Start Date End Date Taking? Authorizing Provider  ACCU-CHEK GUIDE test strip USE TWICE DAILY 05/29/22   Shamleffer, Konrad Dolores, MD  acetaminophen (TYLENOL) 650 MG CR tablet Take 650 mg by mouth 2 (two) times daily as needed for pain.    [provider]  allopurinol (ZYLOPRIM) 300 MG tablet Take 300 mg by mouth every other day. on even days of the month    [provider]  amLODipine (NORVASC) 10 MG tablet Take 1 tablet (10 mg total) by mouth daily. 12/15/22   Jake Bathe, MD  apixaban (ELIQUIS) 5 MG TABS tablet Take 1 tablet (5 mg total) by mouth 2 (two) times daily. 02/27/23   Jake Bathe, MD  Ascorbic Acid (VITAMIN C) 500 MG CAPS Take  500 mg by mouth daily at 6 (six) AM.    [provider]  atorvastatin (LIPITOR) 40 MG tablet Take 1 tablet (40 mg total) by mouth daily. 06/18/22   Barrett, Joline Salt, PA-C  Blood Pressure Monitoring (OMRON 3 SERIES BP MONITOR) DEVI 1 each by Does not apply route daily. 01/16/23   Jake Bathe, MD  carvedilol (COREG) 6.25 MG tablet Take 1 tablet (6.25 mg total) by mouth 2 (two) times daily. 02/21/23   Swinyer, Zachary George, NP  Cholecalciferol (VITAMIN D3) 2000 UNITS TABS Take 2,000 Units by mouth daily.     [provider]  ciprofloxacin (CIPRO) 250 MG tablet Take 250 mg by mouth every 12 (twelve) hours. Patient not taking: Reported on 04/26/2023 04/23/23   [provider]  clonazePAM (KLONOPIN) 1 MG tablet Take 0.5 mg by mouth at bedtime.    [provider]  clopidogrel (PLAVIX) 75 MG tablet Take 1 tablet (75 mg total) by mouth daily. 01/22/23   Jake Bathe, MD  Coenzyme Q10 300 MG CAPS Take 300 mg by mouth daily.    [provider]  dapagliflozin propanediol (FARXIGA) 10 MG TABS tablet Take 1 tablet (10 mg total) by mouth daily before breakfast. 03/22/23   Shamleffer, Konrad Dolores, MD  ezetimibe (ZETIA) 10 MG tablet TAKE 1 TABLET BY MOUTH DAILY 04/17/23   Orbie Pyo,  MD  fenofibrate micronized (LOFIBRA) 200 MG capsule TAKE 1 CAPSULE BY MOUTH DAILY  BEFORE BREAKFAST Patient taking differently: Take 200 mg by mouth daily before breakfast. 12/18/22   Shamleffer, Konrad Dolores, MD  fluorouracil (EFUDEX) 5 % cream Apply 1 Application topically daily as needed (cancer spots). Use as directed    [provider]  fluticasone (FLONASE) 50 MCG/ACT nasal spray Place 2 sprays into both nostrils daily as needed for allergies.    [provider]  glipiZIDE (GLUCOTROL) 5 MG tablet Take 0.5 tablets (2.5 mg total) by mouth 2 (two) times daily before a meal. 03/22/23   Shamleffer, Konrad Dolores, MD  isosorbide mononitrate (IMDUR) 30 MG 24 hr tablet Take 1 tablet (30 mg total) by mouth daily. 12/06/22   Jake Bathe, MD  levothyroxine (SYNTHROID, LEVOTHROID) 175 MCG tablet Take 175 mcg by mouth daily before breakfast.    [provider]  lidocaine (ASPERCREME LIDOCAINE) 4 % Place 1 patch onto the skin daily as needed (pain). Patient not taking: Reported on 04/26/2023    [provider]  loratadine (CLARITIN) 10 MG tablet Take 10 mg by mouth at bedtime.    [provider]  magnesium oxide (MAG-OX) 400 (240 Mg) MG tablet Take 400 mg by mouth daily.    [provider]  Multiple Vitamins-Minerals (PRESERVISION AREDS 2) CAPS Take 1 capsule by mouth 2 (two) times daily.    [provider]  nitroGLYCERIN (NITROSTAT) 0.4 MG SL tablet Place 1 tablet (0.4 mg total) under the tongue every 5 (five) minutes as needed for chest pain. 06/17/22   Barrett, Joline Salt, PA-C  olmesartan (BENICAR) 40 MG tablet Take 1 tablet (40 mg total) by mouth daily. 03/05/23   Jake Bathe, MD  oxyCODONE 10 MG TABS Take 1 tablet (10 mg total) by mouth every 4 (four) hours as needed for severe pain ((score 7 to 10)). Patient not taking: Reported on 04/26/2023 11/11/22   Arman Bogus, MD  pantoprazole (PROTONIX) 40 MG tablet TAKE 1 TABLET BY MOUTH  DAILY Patient taking differently: Take 40 mg by mouth at  bedtime. 08/07/22   Jake Bathe, MD  Plant Sterols and Stanols (CHOLESTOFF PLUS PO) Take 1 capsule by mouth daily.    [provider]  Polyethyl Glycol-Propyl Glycol (SYSTANE OP) Place 1 drop into both eyes daily as needed (dry/irritated eyes).    [provider]  sulfamethoxazole-trimethoprim (BACTRIM) 400-80 MG tablet Take 1 tablet by mouth every 12 (twelve) hours for 14 days. 04/25/23 05/09/23  Clydie Braun, MD  Tamsulosin HCl (FLOMAX) 0.4 MG CAPS Take 0.4 mg by mouth every other day.    [provider]  triamcinolone cream (KENALOG) 0.1 % Apply 1 application  topically daily as needed (rash). 02/07/18   [provider]  TURMERIC PO Take 2 tablets by mouth in the morning and at bedtime. Patient not taking: Reported on 04/26/2023    [provider]  VASCEPA 1 g capsule TAKE 2 CAPSULES BY MOUTH TWICE A DAY 04/06/23   Jake Bathe, MD    Physical Exam: Vitals:   04/27/23 0200 04/27/23 0315 04/27/23 0354 04/27/23 0532  BP: (!) 141/112 136/81  (!) 148/86  Pulse: 83 78  79  Resp:  18    Temp:   97.9 F (36.6 C) 98.4 F (36.9 C)  TempSrc:   Oral Oral  SpO2: 100% 94%  98%     Constitutional: NAD, calm  Eyes: PERTLA, lids and conjunctivae normal ENMT: Mucous membranes are moist. Posterior pharynx clear of any exudate or lesions.   Neck: supple, no masses  Respiratory: no wheezing, no crackles. No accessory muscle use.  Cardiovascular: S1 & S2 heard, regular rate and rhythm. No extremity edema.   Abdomen: No distension, no tenderness, soft. Bowel sounds active.  Musculoskeletal: no clubbing / cyanosis. No joint deformity upper and lower extremities.   Skin: no significant rashes, lesions, ulcers. Warm, dry, well-perfused. Neurologic: CN 2-12 grossly intact. Moving all extremities. Alert and oriented.  Psychiatric: Pleasant. Cooperative.    Labs and Imaging on Admission: I have  personally reviewed following labs and imaging studies  CBC: Recent Labs  Lab 04/24/23 1613 04/24/23 1620 04/25/23 0305 04/26/23 0609 04/27/23 0028  WBC 3.5*  --  6.9 3.0* 4.1  NEUTROABS 3.2  --   --   --  3.3  HGB 15.0 15.6 12.4* 12.3* 12.1*  HCT 44.8 46.0 37.9* 37.2* 34.8*  MCV 88.0  --  89.6 88.6 85.9  PLT 146*  --  110* 121* 155   Basic Metabolic Panel: Recent Labs  Lab 04/24/23 1613 04/24/23 1620 04/25/23 0305 04/26/23 0609 04/27/23 0028  NA 133* 134* 134* 136 134*  K 4.8 4.7 4.4 3.7 4.0  CL 99 102 103 102 102  CO2 19*  --  20* 19* 19*  GLUCOSE 148* 141* 135* 130* 130*  BUN 27* 31* 28* 29* 31*  CREATININE 2.29* 2.20* 1.88* 1.72* 1.67*  CALCIUM 10.0  --  9.2 9.6 9.7   GFR: Estimated Creatinine Clearance: 35.3 mL/min (A) (by C-G formula based on SCr of 1.67 mg/dL (H)). Liver Function Tests: Recent Labs  Lab 04/24/23 1613  AST 20  ALT 16  ALKPHOS 79  BILITOT 2.1*  PROT 7.0  ALBUMIN 3.8   No results for input(s): "LIPASE", "AMYLASE" in the last 168 hours. No results for input(s): "AMMONIA" in the last 168 hours. Coagulation Profile: Recent Labs  Lab 04/24/23 1613  INR 2.0*   Cardiac Enzymes: No results for input(s): "CKTOTAL", "CKMB", "CKMBINDEX", "TROPONINI" in the last 168 hours. BNP (last 3 results) No results  for input(s): "PROBNP" in the last 8760 hours. HbA1C: Recent Labs    04/25/23 0305  HGBA1C 6.1*   CBG: Recent Labs  Lab 04/25/23 0129 04/25/23 0359 04/25/23 0823 04/25/23 1125  GLUCAP 122* 115* 155* 158*   Lipid Profile: No results for input(s): "CHOL", "HDL", "LDLCALC", "TRIG", "CHOLHDL", "LDLDIRECT" in the last 72 hours. Thyroid Function Tests: No results for input(s): "TSH", "T4TOTAL", "FREET4", "T3FREE", "THYROIDAB" in the last 72 hours. Anemia Panel: No results for input(s): "VITAMINB12", "FOLATE", "FERRITIN", "TIBC", "IRON", "RETICCTPCT" in the last 72 hours. Urine analysis:    Component Value Date/Time   COLORURINE  RED (A) 04/26/2023 0838   APPEARANCEUR TURBID (A) 04/26/2023 0838   LABSPEC 1.015 04/26/2023 0838   PHURINE 6.5 04/26/2023 0838   GLUCOSEU >=500 (A) 04/26/2023 0838   HGBUR LARGE (A) 04/26/2023 0838   BILIRUBINUR SMALL (A) 04/26/2023 0838   KETONESUR 15 (A) 04/26/2023 0838   PROTEINUR >300 (A) 04/26/2023 0838   UROBILINOGEN 0.2 03/29/2014 2008   NITRITE POSITIVE (A) 04/26/2023 0838   LEUKOCYTESUR MODERATE (A) 04/26/2023 0838   Sepsis Labs: @LABRCNTIP (procalcitonin:4,lacticidven:4) ) Recent Results (from the past 240 hour(s))  Culture, blood (Routine x 2)     Status: None (Preliminary result)   Collection Time: 04/24/23  4:13 PM   Specimen: BLOOD  Result Value Ref Range Status   Specimen Description BLOOD RIGHT ANTECUBITAL  Final   Special Requests   Final    BOTTLES DRAWN AEROBIC AND ANAEROBIC Blood Culture results may not be optimal due to an excessive volume of blood received in culture bottles   Culture   Final    NO GROWTH 2 DAYS Performed at [facility], [address] [place], [place] [address]    Report Status PENDING  Incomplete  Resp panel by RT-PCR (RSV, Flu A&B, Covid) Anterior Nasal Swab     Status: None   Collection Time: 04/24/23  4:56 PM   Specimen: Anterior Nasal Swab  Result Value Ref Range Status   SARS Coronavirus 2 by RT PCR NEGATIVE NEGATIVE Final   Influenza A by PCR NEGATIVE NEGATIVE Final   Influenza B by PCR NEGATIVE NEGATIVE Final    Comment: (NOTE) The Xpert Xpress SARS-CoV-2/FLU/RSV plus assay is intended as an aid in the diagnosis of influenza from Nasopharyngeal swab specimens and should not be used as a sole basis for treatment. Nasal washings and aspirates are unacceptable for Xpert Xpress SARS-CoV-2/FLU/RSV testing.  Fact Sheet for Patients: BloggerCourse.com  Fact Sheet for Healthcare Providers: SeriousBroker.it  This test is not yet approved or cleared by the [place] FDA  and has been authorized for detection and/or diagnosis of SARS-CoV-2 by FDA under an Emergency Use Authorization (EUA). This EUA will remain in effect (meaning this test can be used) for the duration of the COVID-19 declaration under Section 564(b)(1) of the Act, 21 U.S.C. section 360bbb-3(b)(1), unless the authorization is terminated or revoked.     Resp Syncytial Virus by PCR NEGATIVE NEGATIVE Final    Comment: (NOTE) Fact Sheet for Patients: BloggerCourse.com  Fact Sheet for Healthcare Providers: SeriousBroker.it  This test is not yet approved or cleared by the [place] FDA and has been authorized for detection and/or diagnosis of SARS-CoV-2 by FDA under an Emergency Use Authorization (EUA). This EUA will remain in effect (meaning this test can be used) for the duration of the COVID-19 declaration under Section 564(b)(1) of the Act, 21 U.S.C. section 360bbb-3(b)(1), unless the authorization is terminated or revoked.  Performed at  [facility], [address] [place], [place], [place] [address]   Urine Culture     Status: None   Collection Time: 04/24/23  8:41 PM   Specimen: Urine, Random  Result Value Ref Range Status   Specimen Description URINE, RANDOM  Final   Special Requests NONE Reflexed from [id number]  Final   Culture   Final    NO GROWTH Performed at [facility], [address], [place], [place] [address]    Report Status 04/25/2023 FINAL  Final  Urine Culture (for pregnant, neutropenic or urologic patients or patients with an indwelling urinary catheter)     Status: None   Collection Time: 04/24/23  8:42 PM   Specimen: Urine, Clean Catch  Result Value Ref Range Status   Specimen Description URINE, CLEAN CATCH  Final   Special Requests NONE  Final   Culture   Final    NO GROWTH Performed at [facility], [address] [place], [place] [address]    Report Status 04/25/2023 FINAL  Final  Blood  Culture (routine x 2)     Status: None (Preliminary result)   Collection Time: 04/25/23  3:06 AM   Specimen: BLOOD  Result Value Ref Range Status   Specimen Description BLOOD BLOOD RIGHT ARM  Final   Special Requests   Final    BOTTLES DRAWN AEROBIC AND ANAEROBIC Blood Culture adequate volume   Culture   Final    NO GROWTH 1 DAY Performed at [facility], [address] [place], [place] [address]    Report Status PENDING  Incomplete     Radiological Exams on Admission: CT Renal Stone Study  Result Date: 04/26/2023 CLINICAL DATA:  Status post right ureteral stent placement 04/25/2023 now with urinary retention EXAM: CT ABDOMEN AND PELVIS WITHOUT CONTRAST TECHNIQUE: Multidetector CT imaging of the abdomen and pelvis was performed following the standard protocol without IV contrast. RADIATION DOSE REDUCTION: This exam was performed according to the departmental dose-optimization program which includes automated exposure control, adjustment of the mA and/or kV according to patient size and/or use of iterative reconstruction technique. COMPARISON:  CT abdomen and pelvis dated 04/24/2023 FINDINGS: Lower chest: No focal consolidation or pulmonary nodule in the lung bases. New trace bilateral pleural effusions. Partially imaged heart size is normal. Coronary artery calcifications. Hepatobiliary: No focal hepatic lesions. No intra or extrahepatic biliary ductal dilation. Cholelithiasis. New mild surrounding stranding. Pancreas: No focal lesions or main ductal dilation. Spleen: Enlarged in the AP dimension, 15.1 cm, previously 14.1 cm. Adrenals/Urinary Tract: No adrenal nodules. Right ureteral stent in-situ with proximal pigtail in the upper pole calyces and distal pigtail within the urinary bladder. Similar position of 6 mm stone within the distal right ureter adjacent to the stent (3:66). Unchanged 5 mm stone within the left renal pelvis (3:37). Unchanged additional nonobstructing stone within the  left upper pole. No hydronephrosis. Right interpolar simple-appearing cysts. No specific follow-up imaging recommended. Urinary bladder is decompressed with catheter in-situ and contains small volume air. Stomach/Bowel: Normal appearance of the stomach. No evidence of bowel wall thickening, distention, or inflammatory changes. Normal appendix. Vascular/Lymphatic: Aortic atherosclerosis. Prior right iliac embolization. Aorto bi-iliac stent in-situ. Infrarenal abdominal aortic aneurysm measures 6.8 x 4.9 cm, unchanged. 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 Reproductive: Normal prostate size.  Brachytherapy seeds. Other: Small volume pelvic free fluid. No free air or fluid collection. Musculoskeletal: No acute or abnormal lytic or blastic osseous lesions. Prior L2-3  spinal fixation. Hardware appears intact. Multilevel degenerative changes of the partially imaged thoracic and lumbar spine. Small fat-containing bilateral inguinal and paraumbilical hernias. IMPRESSION: 1. Right ureteral stent in-situ with proximal pigtail in the upper pole calyces and distal pigtail within the urinary bladder. Similar position of 6 mm stone within the distal right ureter adjacent to the stent. 2. Unchanged 5 mm stone within the left renal pelvis. Additional left nephrolithiasis. 3. Cholelithiasis with new mild surrounding stranding, which may be secondary to overall volume status. Recommend correlation with physical examination for right upper quadrant tenderness. 4. New trace bilateral pleural effusions. 5. Unchanged infrarenal abdominal aortic aneurysm measuring up to 6.8 cm with aorto bi-iliac stent in-situ. Recommend clinical follow-up as per vascular specialist. 6. Splenomegaly. 7. Aortic Atherosclerosis (ICD10-I70.0). Coronary artery calcifications. Assessment for potential risk factor modification, dietary therapy or pharmacologic therapy may be warranted, if clinically indicated. Electronically Signed    By: Agustin Cree M.D.   On: 04/26/2023 10:00    EKG: Independently reviewed. Atrial fibrillation, RBBB.   Assessment/Plan   1. Gross hematuria; UTI; ureteral stone  - Hold Eliquis, monitor for obstruction and hand irrigate if needed, send urine for culture, resume Rocephin    2. PAF  - Hold Eliquis initially while monitoring gross hematuria, continue Coreg as tolerated    3. CAD  - No anginal complaints   - Continue statin, Plavix, beta-blocker, nitrates    4. Type II DM  - A1c was 6.1% earlier this month  - Check CBGs and use low-intensity SSI for now    5. Hypertension  - Continue Norvasc and Coreg     6. CKD 3B  - SCr is 1.67 on admission, appears close to baseline  - Renally-dose medications,  monitor   7. Diarrhea  - C diff testing ordered from ED and imodium given  - Continue enteric precautions pending C diff results, monitor volume status and electrolytes    DVT prophylaxis: SCDs  Code Status: Full  Level of Care: Level of care: Progressive Family Communication: None present  Disposition Plan:  Patient is from: Home  Anticipated d/c is to: Home  Anticipated d/c date is: 04/28/23  Patient currently: Being monitored for recurrent urinary tract obstruction d/t clot  Consults called: Urology  Admission status: Observation     Briscoe Deutscher, MD [facility]  04/27/2023, 5:40 AM

## 2023-04-27 NOTE — Consult Note (Addendum)
Urology Consult  Referring physician: Dr. Manus Gunning Reason for referral: Hematuria, urinary retention  Chief Complaint: As above  History of Present Illness: Garrett Jordan, Sr. is an 82 year old male known to our practice.  This would mark his fourth trip to the hospital in the last few days.  He underwent stent with Dr. Marlou Porch on 6/12 for obstructing right ureteral stone.  He has had difficulty with hematuria and clot obstruction of urine since.  He reports no previous history of hematuria.   On assessment this morning patient was alert, oriented, and in no distress.  He had recently had his catheter bag exchanged for clot accumulation though there was no drainage present.  I hand irrigated out around 50 cc of clot material and discussed continuous bladder irrigation with nursing.  Patient is amenable to plan and all questions were answered to his satisfaction.  Past Medical History:  Diagnosis Date   AAA (abdominal aortic aneurysm) (HCC)    Arthritis    knees   Atrial fibrillation (HCC)    CAD (coronary artery disease)    Cancer (HCC)    prostate   CKD (chronic kidney disease), stage III (HCC) 06/18/2022   COVID    06/2019 and in 2022 states they were mild cases   Diabetes mellitus without complication (HCC)    Dyspnea    once in a while   Dysrhythmia    A-fib pt is on Eliquis   GERD (gastroesophageal reflux disease)    Gout    History of kidney stones    Hyperlipidemia    Hypertension    Hypothyroidism    Myocardial infarction (HCC) 01/1991   sp inferior   Nerve compression    right leg   Reflux    Seasonal allergies    Thoracic ascending aortic aneurysm (HCC)    Thyroid disease    hypothyroidism   Past Surgical History:  Procedure Laterality Date   ABDOMINAL AORTAGRAM N/A 02/28/2012   Procedure: ABDOMINAL Ronny Flurry;  Surgeon: Nada Libman, MD;  Location: [facility] CATH LAB;  Service: Cardiovascular;  Laterality: N/A;   abdominal aortagram embolization  02/28/2012    ABDOMINAL AORTIC ANEURYSM REPAIR  07/14/2012   CARDIAC CATHETERIZATION     CATARACT EXTRACTION Bilateral 2016   CORONARY ARTERY BYPASS GRAFT  04/13/2009   CORONARY BALLOON ANGIOPLASTY N/A 06/15/2022   Procedure: CORONARY BALLOON ANGIOPLASTY;  Surgeon: Tonny Bollman, MD;  Location: [facility]  INVASIVE CV LAB;  Service: Cardiovascular;  Laterality: N/A;   CORONARY STENT PLACEMENT  1992 and  2009   RCA   CYSTOSCOPY W/ URETERAL STENT PLACEMENT Right 04/24/2023   Procedure: CYSTOSCOPY WITH RETROGRADE PYELOGRAM/URETERAL STENT PLACEMENT;  Surgeon: Crist Fat, MD;  Location: [facility] OR;  Service: Urology;  Laterality: Right;   EMBOLIZATION Right 02/28/2012   Procedure: EMBOLIZATION;  Surgeon: Nada Libman, MD;  Location: [facility] CATH LAB;  Service: Cardiovascular;  Laterality: Right;   IR GENERIC HISTORICAL  12/05/2016   IR RADIOLOGIST EVAL & MGMT 12/05/2016 Malachy Moan, MD [facility] INTERV RAD   IR RADIOLOGIST EVAL & MGMT  07/10/2019   IR RADIOLOGIST EVAL & MGMT  08/25/2020   IR RADIOLOGIST EVAL & MGMT  08/16/2021   IR RADIOLOGIST EVAL & MGMT  08/31/2022   KIDNEY STONE SURGERY     LAMINECTOMY WITH POSTERIOR LATERAL ARTHRODESIS LEVEL 1 Bilateral 11/10/2022   Procedure: Lumbar Two-Lumbar Three laminectomy  - bilateral with posterior lateral fusion and interspinous plating;  Surgeon: Tia Alert, MD;  Location[facility] OR;  Service:  Neurosurgery;  Laterality: Bilateral;   LEFT HEART CATH AND CORONARY ANGIOGRAPHY N/A 06/15/2022   Procedure: LEFT HEART CATH AND CORONARY ANGIOGRAPHY;  Surgeon: Tonny Bollman, MD;  Location: [facility] INVASIVE CV LAB;  Service: Cardiovascular;  Laterality: N/A;   LEFT HEART CATHETERIZATION WITH CORONARY/GRAFT ANGIOGRAM N/A 01/08/2015   Procedure: LEFT HEART CATHETERIZATION WITH Isabel Caprice;  Surgeon: Othella Boyer, MD;  Location: [facility] CATH LAB;  Service: Cardiovascular;  Laterality: N/A;   parathyroid adenoma  1981   surgery   PROSTATE SURGERY     rad seeds   SPINE  SURGERY  2006   TONSILLECTOMY  1955    Medications: I have reviewed the patient's current medications. Allergies:  Allergies  Allergen Reactions   Ace Inhibitors Cough   Crestor [Rosuvastatin] Other (See Comments)    Muscle aches   Nitroglycerin Other (See Comments)    Very pronounced lowered BP with IV nitro for caths   Quinolones Other (See Comments)    Aortic root enlargement    Family History  Problem Relation Age of Onset   Heart disease Father        Heart Disease before age 60   Heart attack Father    Hyperlipidemia Father    Hypertension Father    Stroke Mother    Deep vein thrombosis Mother    Heart disease Sister    Diabetes Sister    Hyperlipidemia Sister    Hypertension Sister    Heart attack Sister    Heart disease Paternal Uncle    Diabetes Maternal Grandmother    Diabetes Sister    Social History:  reports that he quit smoking about 52 years ago. His smoking use included cigarettes. He has never used smokeless tobacco. He reports current alcohol use of about 6.0 standard drinks of alcohol per week. He reports that he does not use drugs.  Review of Systems  Genitourinary:  Positive for flank pain and hematuria.       Urinary retention/clot obstruction of urine     Physical Exam:  Vital signs in last 24 hours: Temp:  [97.5 F (36.4 C)-98.4 F (36.9 C)] 98.4 F (36.9 C) (06/14 0532) Pulse Rate:  [74-85] 79 (06/14 0532) Resp:  [14-20] 14 (06/14 0700) BP: (128-151)/(80-112) 148/86 (06/14 0532) SpO2:  [94 %-100 %] 98 % (06/14 0532) Weight:  [84.4 kg-86.9 kg] 86.9 kg (06/14 0545)  Cardiovascular: Skin warm; not flushed Respiratory: Breaths quiet; no shortness of breath Abdomen: No masses, soft, no guarding or rebound Neurological: Normal sensation to touch Musculoskeletal: Normal motor function arms and legs Skin: No rashes Genitourinary: Three-way Foley catheter in place with CBI port plugged.  Laboratory Data:  Results for orders placed or  performed during the hospital encounter of 04/26/23 (from the past 72 hour(s))  Basic metabolic panel     Status: Abnormal   Collection Time: 04/27/23 12:28 AM  Result Value Ref Range   Sodium 134 (L) 135 - 145 mmol/L   Potassium 4.0 3.5 - 5.1 mmol/L   Chloride 102 98 - 111 mmol/L   CO2 19 (L) 22 - 32 mmol/L   Glucose, Bld 130 (H) 70 - 99 mg/dL    Comment: Glucose reference range applies only to samples taken after fasting for at least 8 hours.   BUN 31 (H) 8 - 23 mg/dL   Creatinine, Ser 4.09 (H) 0.61 - 1.24 mg/dL   Calcium 9.7 8.9 - 81.1 mg/dL   GFR, Estimated 41 (L) >60 mL/min    Comment: (NOTE)  Calculated using the CKD-EPI Creatinine Equation (2021)    Anion gap 13 5 - 15    Comment: Performed at [facility], [address], [place], [place] [address]  CBC with Differential     Status: Abnormal   Collection Time: 04/27/23 12:28 AM  Result Value Ref Range   WBC 4.1 4.0 - 10.5 K/uL   RBC 4.05 (L) 4.22 - 5.81 MIL/uL   Hemoglobin 12.1 (L) 13.0 - 17.0 g/dL   HCT 62.1 (L) 30.8 - 65.7 %   MCV 85.9 80.0 - 100.0 fL   MCH 29.9 26.0 - 34.0 pg   MCHC 34.8 30.0 - 36.0 g/dL   RDW 84.6 96.2 - 95.2 %   Platelets 155 150 - 400 K/uL   nRBC 0.0 0.0 - 0.2 %   Neutrophils Relative % 79 %   Neutro Abs 3.3 1.7 - 7.7 K/uL   Lymphocytes Relative 8 %   Lymphs Abs 0.3 (L) 0.7 - 4.0 K/uL   Monocytes Relative 12 %   Monocytes Absolute 0.5 0.1 - 1.0 K/uL   Eosinophils Relative 0 %   Eosinophils Absolute 0.0 0.0 - 0.5 K/uL   Basophils Relative 0 %   Basophils Absolute 0.0 0.0 - 0.1 K/uL   Immature Granulocytes 1 %   Abs Immature Granulocytes 0.02 0.00 - 0.07 K/uL    Comment: Performed at [facility], [address] [place], [place] [address]  Type and screen [facility]     Status: None   Collection Time: 04/27/23  6:30 AM  Result Value Ref Range   ABO/RH(D) O POS    Antibody Screen NEG    Sample Expiration       04/30/2023,2359 Performed at [facility], [address] [place], [place] [address]   Basic metabolic panel     Status: Abnormal   Collection Time: 04/27/23  6:30 AM  Result Value Ref Range   Sodium 135 135 - 145 mmol/L   Potassium 4.1 3.5 - 5.1 mmol/L   Chloride 102 98 - 111 mmol/L   CO2 22 22 - 32 mmol/L   Glucose, Bld 113 (H) 70 - 99 mg/dL    Comment: Glucose reference range applies only to samples taken after fasting for at least 8 hours.   BUN 27 (H) 8 - 23 mg/dL   Creatinine, Ser 2.72 (H) 0.61 - 1.24 mg/dL   Calcium 9.3 8.9 - 53.6 mg/dL   GFR, Estimated 47 (L) >60 mL/min    Comment: (NOTE) Calculated using the CKD-EPI Creatinine Equation (2021)    Anion gap 11 5 - 15    Comment: Performed at [facility], [address], [place], [place] [address]  CBC     Status: Abnormal   Collection Time: 04/27/23  6:30 AM  Result Value Ref Range   WBC 3.5 (L) 4.0 - 10.5 K/uL   RBC 4.07 (L) 4.22 - 5.81 MIL/uL   Hemoglobin 11.8 (L) 13.0 - 17.0 g/dL   HCT 47.4 (L) 25.9 - 56.3 %   MCV 89.2 80.0 - 100.0 fL   MCH 29.0 26.0 - 34.0 pg   MCHC 32.5 30.0 - 36.0 g/dL   RDW 87.5 64.3 - 32.9 %   Platelets 140 (L) 150 - 400 K/uL   nRBC 0.0 0.0 - 0.2 %    Comment: Performed at [facility], [address], [place], [place] [address]  Glucose, capillary     Status: Abnormal  Collection Time: 04/27/23  8:27 AM  Result Value Ref Range   Glucose-Capillary 167 (H) 70 - 99 mg/dL    Comment: Glucose reference range applies only to samples taken after fasting for at least 8 hours.   Recent Results (from the past 240 hour(s))  Culture, blood (Routine x 2)     Status: None (Preliminary result)   Collection Time: 04/24/23  4:13 PM   Specimen: BLOOD  Result Value Ref Range Status   Specimen Description BLOOD RIGHT ANTECUBITAL  Final   Special Requests   Final    BOTTLES DRAWN AEROBIC AND ANAEROBIC Blood Culture results may not be optimal due to  an excessive volume of blood received in culture bottles   Culture   Final    NO GROWTH 3 DAYS Performed at [facility], [address], [place], [place] [address]    Report Status PENDING  Incomplete  Resp panel by RT-PCR (RSV, Flu A&B, Covid) Anterior Nasal Swab     Status: None   Collection Time: 04/24/23  4:56 PM   Specimen: Anterior Nasal Swab  Result Value Ref Range Status   SARS Coronavirus 2 by RT PCR NEGATIVE NEGATIVE Final   Influenza A by PCR NEGATIVE NEGATIVE Final   Influenza B by PCR NEGATIVE NEGATIVE Final    Comment: (NOTE) The Xpert Xpress SARS-CoV-2/FLU/RSV plus assay is intended as an aid in the diagnosis of influenza from Nasopharyngeal swab specimens and should not be used as a sole basis for treatment. Nasal washings and aspirates are unacceptable for Xpert Xpress SARS-CoV-2/FLU/RSV testing.  Fact Sheet for Patients: BloggerCourse.com  Fact Sheet for Healthcare Providers: SeriousBroker.it  This test is not yet approved or cleared by the [place] FDA and has been authorized for detection and/or diagnosis of SARS-CoV-2 by FDA under an Emergency Use Authorization (EUA). This EUA will remain in effect (meaning this test can be used) for the duration of the COVID-19 declaration under Section 564(b)(1) of the Act, 21 U.S.C. section 360bbb-3(b)(1), unless the authorization is terminated or revoked.     Resp Syncytial Virus by PCR NEGATIVE NEGATIVE Final    Comment: (NOTE) Fact Sheet for Patients: BloggerCourse.com  Fact Sheet for Healthcare Providers: SeriousBroker.it  This test is not yet approved or cleared by the [place] FDA and has been authorized for detection and/or diagnosis of SARS-CoV-2 by FDA under an Emergency Use Authorization (EUA). This EUA will remain in effect (meaning this test can be used) for the duration of the COVID-19  declaration under Section 564(b)(1) of the Act, 21 U.S.C. section 360bbb-3(b)(1), unless the authorization is terminated or revoked.  Performed at [facility], [address] [place], [place] [address]   Urine Culture     Status: None   Collection Time: 04/24/23  8:41 PM   Specimen: Urine, Random  Result Value Ref Range Status   Specimen Description URINE, RANDOM  Final   Special Requests NONE Reflexed from [id number]  Final   Culture   Final    NO GROWTH Performed at [facility], [address], [place], [place] [address]    Report Status 04/25/2023 FINAL  Final  Urine Culture (for pregnant, neutropenic or urologic patients or patients with an indwelling urinary catheter)     Status: None   Collection Time: 04/24/23  8:42 PM   Specimen: Urine, Clean Catch  Result Value Ref Range Status   Specimen Description URINE, CLEAN CATCH  Final   Special Requests NONE  Final   Culture   Final    NO GROWTH Performed at [facility], [address] [place], [place] [address]    Report Status 04/25/2023 FINAL  Final  Blood Culture (routine x 2)     Status: None (Preliminary result)   Collection Time: 04/25/23  3:06 AM   Specimen: BLOOD  Result Value Ref Range Status   Specimen Description BLOOD BLOOD RIGHT ARM  Final   Special Requests   Final    BOTTLES DRAWN AEROBIC AND ANAEROBIC Blood Culture adequate volume   Culture   Final    NO GROWTH 2 DAYS Performed at [facility], [address] [place], [place] [address]    Report Status PENDING  Incomplete   Creatinine: Recent Labs    04/24/23 1613 04/24/23 1620 04/25/23 0305 04/26/23 0609 04/27/23 0028 04/27/23 0630  CREATININE 2.29* 2.20* 1.88* 1.72* 1.67* 1.49*    Imaging:  Independent review of CT A/P shows right ureteral stent in place with adjacent 6 mm stone.  Please see independent report for outline of significant cardiovascular findings.  Assessment/Plan:  Three-way catheter already in  place.  Hand irrigate clot material and start continuous bladder irrigation.  With his CV history would benefit from input from cardiology regarding his Eliquis/Plavix. Will consider laser lithotripsy on this hospitalization depending on his progress. Urology will follow along.   Scherrie Bateman [place] 04/27/2023, 10:51 AM  Pager: [phone]  I have seen and examined the patient and agree with the above assessment and plan.  Representation with gross hematuria requiring upsize foley catheter. Urine slight pink on CBI. Continue CBI. Wean as able. Appreciate cardiology assistance in managing anticoagulation. -If continues to bleed, may need to proceed with definitive management of stone while in hospital. -Will notify Dr. Marlou Porch.  -Following.  Matt R. Nancy Manuele MD [facility]  Pager: [phone]

## 2023-04-27 NOTE — Progress Notes (Signed)
Urine patent from penis, but clotting in drainage bag. Aseptically changed catheter bag

## 2023-04-27 NOTE — ED Notes (Signed)
Attempted to irrigate foley without success. EDP aware.

## 2023-04-28 DIAGNOSIS — D6832 Hemorrhagic disorder due to extrinsic circulating anticoagulants: Secondary | ICD-10-CM | POA: Diagnosis present

## 2023-04-28 DIAGNOSIS — E785 Hyperlipidemia, unspecified: Secondary | ICD-10-CM | POA: Diagnosis present

## 2023-04-28 DIAGNOSIS — D72819 Decreased white blood cell count, unspecified: Secondary | ICD-10-CM | POA: Diagnosis not present

## 2023-04-28 DIAGNOSIS — E1122 Type 2 diabetes mellitus with diabetic chronic kidney disease: Secondary | ICD-10-CM | POA: Diagnosis present

## 2023-04-28 DIAGNOSIS — T45515A Adverse effect of anticoagulants, initial encounter: Secondary | ICD-10-CM | POA: Diagnosis present

## 2023-04-28 DIAGNOSIS — N132 Hydronephrosis with renal and ureteral calculous obstruction: Secondary | ICD-10-CM | POA: Diagnosis not present

## 2023-04-28 DIAGNOSIS — E1165 Type 2 diabetes mellitus with hyperglycemia: Secondary | ICD-10-CM | POA: Diagnosis not present

## 2023-04-28 DIAGNOSIS — E8721 Acute metabolic acidosis: Secondary | ICD-10-CM | POA: Diagnosis present

## 2023-04-28 DIAGNOSIS — Z5181 Encounter for therapeutic drug level monitoring: Secondary | ICD-10-CM | POA: Diagnosis not present

## 2023-04-28 DIAGNOSIS — M170 Bilateral primary osteoarthritis of knee: Secondary | ICD-10-CM | POA: Diagnosis present

## 2023-04-28 DIAGNOSIS — R319 Hematuria, unspecified: Secondary | ICD-10-CM | POA: Diagnosis present

## 2023-04-28 DIAGNOSIS — Z95828 Presence of other vascular implants and grafts: Secondary | ICD-10-CM | POA: Diagnosis not present

## 2023-04-28 DIAGNOSIS — I4510 Unspecified right bundle-branch block: Secondary | ICD-10-CM | POA: Diagnosis present

## 2023-04-28 DIAGNOSIS — Z87891 Personal history of nicotine dependence: Secondary | ICD-10-CM | POA: Diagnosis not present

## 2023-04-28 DIAGNOSIS — Y842 Radiological procedure and radiotherapy as the cause of abnormal reaction of the patient, or of later complication, without mention of misadventure at the time of the procedure: Secondary | ICD-10-CM | POA: Diagnosis present

## 2023-04-28 DIAGNOSIS — E871 Hypo-osmolality and hyponatremia: Secondary | ICD-10-CM | POA: Diagnosis present

## 2023-04-28 DIAGNOSIS — N201 Calculus of ureter: Secondary | ICD-10-CM | POA: Diagnosis not present

## 2023-04-28 DIAGNOSIS — E1141 Type 2 diabetes mellitus with diabetic mononeuropathy: Secondary | ICD-10-CM | POA: Diagnosis present

## 2023-04-28 DIAGNOSIS — I10 Essential (primary) hypertension: Secondary | ICD-10-CM | POA: Diagnosis not present

## 2023-04-28 DIAGNOSIS — N136 Pyonephrosis: Secondary | ICD-10-CM | POA: Diagnosis present

## 2023-04-28 DIAGNOSIS — N200 Calculus of kidney: Secondary | ICD-10-CM | POA: Diagnosis not present

## 2023-04-28 DIAGNOSIS — Z951 Presence of aortocoronary bypass graft: Secondary | ICD-10-CM | POA: Diagnosis not present

## 2023-04-28 DIAGNOSIS — R339 Retention of urine, unspecified: Secondary | ICD-10-CM | POA: Diagnosis present

## 2023-04-28 DIAGNOSIS — N1832 Chronic kidney disease, stage 3b: Secondary | ICD-10-CM | POA: Diagnosis present

## 2023-04-28 DIAGNOSIS — I25810 Atherosclerosis of coronary artery bypass graft(s) without angina pectoris: Secondary | ICD-10-CM | POA: Diagnosis present

## 2023-04-28 DIAGNOSIS — Z7901 Long term (current) use of anticoagulants: Secondary | ICD-10-CM | POA: Diagnosis not present

## 2023-04-28 DIAGNOSIS — R197 Diarrhea, unspecified: Secondary | ICD-10-CM | POA: Diagnosis present

## 2023-04-28 DIAGNOSIS — R310 Gross hematuria: Secondary | ICD-10-CM | POA: Diagnosis present

## 2023-04-28 DIAGNOSIS — E876 Hypokalemia: Secondary | ICD-10-CM | POA: Diagnosis not present

## 2023-04-28 DIAGNOSIS — I482 Chronic atrial fibrillation, unspecified: Secondary | ICD-10-CM | POA: Diagnosis not present

## 2023-04-28 DIAGNOSIS — I129 Hypertensive chronic kidney disease with stage 1 through stage 4 chronic kidney disease, or unspecified chronic kidney disease: Secondary | ICD-10-CM | POA: Diagnosis present

## 2023-04-28 DIAGNOSIS — I2510 Atherosclerotic heart disease of native coronary artery without angina pectoris: Secondary | ICD-10-CM | POA: Diagnosis present

## 2023-04-28 DIAGNOSIS — D696 Thrombocytopenia, unspecified: Secondary | ICD-10-CM | POA: Diagnosis not present

## 2023-04-28 DIAGNOSIS — E039 Hypothyroidism, unspecified: Secondary | ICD-10-CM | POA: Diagnosis present

## 2023-04-28 DIAGNOSIS — I480 Paroxysmal atrial fibrillation: Secondary | ICD-10-CM | POA: Diagnosis present

## 2023-04-28 LAB — CBC
HCT: 31.4 % — ABNORMAL LOW (ref 39.0–52.0)
Hemoglobin: 10.4 g/dL — ABNORMAL LOW (ref 13.0–17.0)
MCH: 29.4 pg (ref 26.0–34.0)
MCHC: 33.1 g/dL (ref 30.0–36.0)
MCV: 88.7 fL (ref 80.0–100.0)
Platelets: 143 10*3/uL — ABNORMAL LOW (ref 150–400)
RBC: 3.54 MIL/uL — ABNORMAL LOW (ref 4.22–5.81)
RDW: 13.4 % (ref 11.5–15.5)
WBC: 3.7 10*3/uL — ABNORMAL LOW (ref 4.0–10.5)
nRBC: 0 % (ref 0.0–0.2)

## 2023-04-28 LAB — CULTURE, BLOOD (ROUTINE X 2)
Culture: NO GROWTH
Special Requests: ADEQUATE

## 2023-04-28 LAB — GLUCOSE, CAPILLARY
Glucose-Capillary: 100 mg/dL — ABNORMAL HIGH (ref 70–99)
Glucose-Capillary: 168 mg/dL — ABNORMAL HIGH (ref 70–99)
Glucose-Capillary: 174 mg/dL — ABNORMAL HIGH (ref 70–99)
Glucose-Capillary: 184 mg/dL — ABNORMAL HIGH (ref 70–99)

## 2023-04-28 LAB — BASIC METABOLIC PANEL
Anion gap: 7 (ref 5–15)
BUN: 22 mg/dL (ref 8–23)
CO2: 24 mmol/L (ref 22–32)
Calcium: 8.9 mg/dL (ref 8.9–10.3)
Chloride: 105 mmol/L (ref 98–111)
Creatinine, Ser: 1.49 mg/dL — ABNORMAL HIGH (ref 0.61–1.24)
GFR, Estimated: 47 mL/min — ABNORMAL LOW (ref >60)
Glucose, Bld: 132 mg/dL — ABNORMAL HIGH (ref 70–99)
Potassium: 3.5 mmol/L (ref 3.5–5.1)
Sodium: 136 mmol/L (ref 135–145)

## 2023-04-28 LAB — URINE CULTURE: Culture: NO GROWTH

## 2023-04-28 NOTE — Progress Notes (Signed)
PROGRESS NOTE    Garrett Jordan  XLK:[id number] DOB: [birth year] DOA: 04/26/2023 PCP: Garrett Joe, MD   Brief Narrative:  82 y.o. male with medical history significant for hypertension, type 2 diabetes mellitus, CKD 3B, CAD, PAF on Eliquis, AAA, and recent admission for obstructing right ureteral stone complicated by sepsis secondary to pyelonephritis requiring cystoscopy with right ureteral stent placement on 04/25/2023 and discharged home on oral Bactrim presented with bloody urine leaking around the catheter and suprapubic pain with abdominal distention and diarrhea.  On presentation, creatinine was 1.67.  Urology was consulted and he was started on continuous bladder irrigation.  Assessment & Plan:   Gross hematuria UTI: Present on admission History of recent pyelonephritis secondary to right ureteral stone requiring cystoscopy and right ureteral stent placement -Urology was consulted and he was started on continuous bladder irrigation.  Follow urology recommendations. -Continue Rocephin.  Follow urine cultures -Plavix and Eliquis on hold. -Monitor hemoglobin.  10.4 today.  Paroxysmal A-fib -Rate controlled.  Plavix on hold.  Continue Coreg.  Outpatient follow-up with cardiology  CAD Hypertension hyperlipidemia -Continue statin, amlodipine, Coreg, irbesartan, isosorbide mononitrate.  Outpatient follow-up with cardiology  CKD stage IIIb -Creatinine currently stable  Diabetes mellitus type 2 with hyperglycemia -A1c 6.1 earlier this month.  Continue CBGs with SSI.  Hypothyroidism -Continue levothyroxine  Thrombocytopenia Questionable cause.  Monitor  Leukopenia Mild.  Monitor intermittently  Hyponatremia -Mild.  Resolved  Acute metabolic acidosis -Resolved  Diarrhea--questionable cause.  Stool for C. difficile negative.  Use Imodium as needed    DVT prophylaxis: SCDs Code Status: Full Family Communication: None at bedside Disposition Plan: Status is:  Observation The patient will require care spanning > 2 midnights and should be moved to inpatient because: Of need for bladder irrigation.    Consultants: Urology  Procedures: None  Antimicrobials: Rocephin from 04/27/2023 onwards   Subjective: Patient seen and examined at bedside.  Denies worsening abdominal pain, nausea, vomiting or fever.  Feels slightly better.  Objective: Vitals:   04/27/23 1203 04/27/23 2107 04/28/23 0437 04/28/23 0500  BP: 131/81 122/78 127/86   Pulse: 80 73 69   Resp: 19 18 16    Temp: 98.6 F (37 C) 98.4 F (36.9 C) 97.8 F (36.6 C)   TempSrc: Oral Oral Oral   SpO2: 96% 97% 98%   Weight:    86 kg  Height:        Intake/Output Summary (Last 24 hours) at 04/28/2023 1045 Last data filed at 04/28/2023 1022 Gross per 24 hour  Intake 3243 ml  Output 36644 ml  Net -13558 ml   Filed Weights   04/27/23 0545 04/28/23 0500  Weight: 86.9 kg 86 kg    Examination:  General exam: Appears calm and comfortable.  On room air.  Elderly male lying in bed.  Looks chronically ill and deconditioned. Respiratory system: Bilateral decreased breath sounds at bases with some scattered crackles Cardiovascular system: S1 & S2 heard, Rate controlled Gastrointestinal system: Abdomen is nondistended, soft and nontender. Normal bowel sounds heard. Extremities: No cyanosis, clubbing, edema  Central nervous system: Alert and oriented. No focal neurological deficits. Moving extremities Skin: No rashes, lesions or ulcers Psychiatry: Judgement and insight appear normal. Mood & affect appropriate.  Genitourinary: Foley catheter present draining pinkish urine    Data Reviewed: I have personally reviewed following labs and imaging studies  CBC: Recent Labs  Lab 04/24/23 1613 04/24/23 1620 04/25/23 0305 04/26/23 0609 04/27/23 0028 04/27/23 0630 04/28/23 0459  WBC 3.5*  --  6.9 3.0* 4.1 3.5* 3.7*  NEUTROABS 3.2  --   --   --  3.3  --   --   HGB 15.0   < > 12.4* 12.3*  12.1* 11.8* 10.4*  HCT 44.8   < > 37.9* 37.2* 34.8* 36.3* 31.4*  MCV 88.0  --  89.6 88.6 85.9 89.2 88.7  PLT 146*  --  110* 121* 155 140* 143*   < > = values in this interval not displayed.   Basic Metabolic Panel: Recent Labs  Lab 04/25/23 0305 04/26/23 0609 04/27/23 0028 04/27/23 0630 04/28/23 0459  NA 134* 136 134* 135 136  K 4.4 3.7 4.0 4.1 3.5  CL 103 102 102 102 105  CO2 20* 19* 19* 22 24  GLUCOSE 135* 130* 130* 113* 132*  BUN 28* 29* 31* 27* 22  CREATININE 1.88* 1.72* 1.67* 1.49* 1.49*  CALCIUM 9.2 9.6 9.7 9.3 8.9   GFR: Estimated Creatinine Clearance: 43 mL/min (A) (by C-G formula based on SCr of 1.49 mg/dL (H)). Liver Function Tests: Recent Labs  Lab 04/24/23 1613  AST 20  ALT 16  ALKPHOS 79  BILITOT 2.1*  PROT 7.0  ALBUMIN 3.8   No results for input(s): "LIPASE", "AMYLASE" in the last 168 hours. No results for input(s): "AMMONIA" in the last 168 hours. Coagulation Profile: Recent Labs  Lab 04/24/23 1613  INR 2.0*   Cardiac Enzymes: No results for input(s): "CKTOTAL", "CKMB", "CKMBINDEX", "TROPONINI" in the last 168 hours. BNP (last 3 results) No results for input(s): "PROBNP" in the last 8760 hours. HbA1C: No results for input(s): "HGBA1C" in the last 72 hours. CBG: Recent Labs  Lab 04/27/23 0827 04/27/23 1159 04/27/23 1621 04/27/23 2104 04/28/23 0732  GLUCAP 167* 136* 204* 143* 100*   Lipid Profile: No results for input(s): "CHOL", "HDL", "LDLCALC", "TRIG", "CHOLHDL", "LDLDIRECT" in the last 72 hours. Thyroid Function Tests: No results for input(s): "TSH", "T4TOTAL", "FREET4", "T3FREE", "THYROIDAB" in the last 72 hours. Anemia Panel: No results for input(s): "VITAMINB12", "FOLATE", "FERRITIN", "TIBC", "IRON", "RETICCTPCT" in the last 72 hours. Sepsis Labs: Recent Labs  Lab 04/24/23 1613 04/24/23 2049  LATICACIDVEN 3.4* 2.2*    Recent Results (from the past 240 hour(s))  Culture, blood (Routine x 2)     Status: None (Preliminary  result)   Collection Time: 04/24/23  4:13 PM   Specimen: BLOOD  Result Value Ref Range Status   Specimen Description BLOOD RIGHT ANTECUBITAL  Final   Special Requests   Final    BOTTLES DRAWN AEROBIC AND ANAEROBIC Blood Culture results may not be optimal due to an excessive volume of blood received in culture bottles   Culture   Final    NO GROWTH 4 DAYS Performed at [facility], [address], [place], [place] [address]    Report Status PENDING  Incomplete  Resp panel by RT-PCR (RSV, Flu A&B, Covid) Anterior Nasal Swab     Status: None   Collection Time: 04/24/23  4:56 PM   Specimen: Anterior Nasal Swab  Result Value Ref Range Status   SARS Coronavirus 2 by RT PCR NEGATIVE NEGATIVE Final   Influenza A by PCR NEGATIVE NEGATIVE Final   Influenza B by PCR NEGATIVE NEGATIVE Final    Comment: (NOTE) The Xpert Xpress SARS-CoV-2/FLU/RSV plus assay is intended as an aid in the diagnosis of influenza from Nasopharyngeal swab specimens and should not be used as a sole basis for treatment. Nasal washings and aspirates are unacceptable for Xpert Xpress SARS-CoV-2/FLU/RSV  testing.  Fact Sheet for Patients: BloggerCourse.com  Fact Sheet for Healthcare Providers: SeriousBroker.it  This test is not yet approved or cleared by the [place] FDA and has been authorized for detection and/or diagnosis of SARS-CoV-2 by FDA under an Emergency Use Authorization (EUA). This EUA will remain in effect (meaning this test can be used) for the duration of the COVID-19 declaration under Section 564(b)(1) of the Act, 21 U.S.C. section 360bbb-3(b)(1), unless the authorization is terminated or revoked.     Resp Syncytial Virus by PCR NEGATIVE NEGATIVE Final    Comment: (NOTE) Fact Sheet for Patients: BloggerCourse.com  Fact Sheet for Healthcare Providers: SeriousBroker.it  This test is not  yet approved or cleared by the [place] FDA and has been authorized for detection and/or diagnosis of SARS-CoV-2 by FDA under an Emergency Use Authorization (EUA). This EUA will remain in effect (meaning this test can be used) for the duration of the COVID-19 declaration under Section 564(b)(1) of the Act, 21 U.S.C. section 360bbb-3(b)(1), unless the authorization is terminated or revoked.  Performed at [facility], [address] [place], [place] [address]   Urine Culture     Status: None   Collection Time: 04/24/23  8:41 PM   Specimen: Urine, Random  Result Value Ref Range Status   Specimen Description URINE, RANDOM  Final   Special Requests NONE Reflexed from [id number]  Final   Culture   Final    NO GROWTH Performed at [facility], [address], [place], [place] [address]    Report Status 04/25/2023 FINAL  Final  Urine Culture (for pregnant, neutropenic or urologic patients or patients with an indwelling urinary catheter)     Status: None   Collection Time: 04/24/23  8:42 PM   Specimen: Urine, Clean Catch  Result Value Ref Range Status   Specimen Description URINE, CLEAN CATCH  Final   Special Requests NONE  Final   Culture   Final    NO GROWTH Performed at [facility], [address] [place], [place] [address]    Report Status 04/25/2023 FINAL  Final  Blood Culture (routine x 2)     Status: None (Preliminary result)   Collection Time: 04/25/23  3:06 AM   Specimen: BLOOD  Result Value Ref Range Status   Specimen Description BLOOD BLOOD RIGHT ARM  Final   Special Requests   Final    BOTTLES DRAWN AEROBIC AND ANAEROBIC Blood Culture adequate volume   Culture   Final    NO GROWTH 3 DAYS Performed at [facility], [address], [place], [place] [address]    Report Status PENDING  Incomplete  Urine Culture (for pregnant, neutropenic or urologic patients or patients with an indwelling urinary catheter)     Status: None   Collection Time:  04/27/23  9:07 AM   Specimen: Urine, Catheterized  Result Value Ref Range Status   Specimen Description   Final    URINE, CATHETERIZED Performed at [facility], [address], [place], [place] [address]    Special Requests   Final    NONE Performed at [facility], [address] [place], [place] [address]    Culture   Final    NO GROWTH Performed at [facility], [address], [place], [place] [address]    Report Status 04/28/2023 FINAL  Final  C Difficile Quick Screen w PCR reflex     Status: None  Collection Time: 04/27/23  9:55 AM   Specimen: STOOL  Result Value Ref Range Status   C Diff antigen NEGATIVE NEGATIVE Final   C Diff toxin NEGATIVE NEGATIVE Final   C Diff interpretation No C. difficile detected.  Final    Comment: Performed at [facility], [address], [place], [place] [address]         Radiology Studies: No results found.      Scheduled Meds:  amLODipine  10 mg Oral Daily   atorvastatin  40 mg Oral Daily   carvedilol  6.25 mg Oral BID   Chlorhexidine Gluconate Cloth  6 each Topical Daily   clonazePAM  0.5 mg Oral QHS   insulin aspart  0-5 Units Subcutaneous QHS   insulin aspart  0-6 Units Subcutaneous TID WC   irbesartan  300 mg Oral Daily   isosorbide mononitrate  30 mg Oral Daily   levothyroxine  175 mcg Oral Q0600   pantoprazole  40 mg Oral QHS   sodium chloride flush  3 mL Intravenous Q12H   Continuous Infusions:  cefTRIAXone (ROCEPHIN)  IV 1 g (04/28/23 0535)   sodium chloride irrigation            Glade Lloyd, MD [facility] 04/28/2023, 10:45 AM

## 2023-04-28 NOTE — Progress Notes (Signed)
Subjective: Urine watermelon and thin on light CBI. Patient had a great night, slept well. CBI rate decreased on AM rounds.  Objective: Vital signs in last 24 hours: Temp:  [97.8 F (36.6 C)-98.6 F (37 C)] 97.8 F (36.6 C) (06/15 0437) Pulse Rate:  [69-80] 69 (06/15 0437) Resp:  [16-19] 16 (06/15 0437) BP: (122-131)/(78-86) 127/86 (06/15 0437) SpO2:  [96 %-98 %] 98 % (06/15 0437) Weight:  [86 kg] 86 kg (06/15 0500)  Intake/Output from previous day: 06/14 0701 - 06/15 0700 In: 3343 [P.O.:240; I.V.:3; IV Piggyback:100] Out: [address] [Urine:14300] Intake/Output this shift: Total I/O In: -  Out: 2050 [Urine:2050]  Physical Exam:  General: Alert and oriented CV: RRR Lungs: Clear Abdomen: Soft, ND GU: foley catheter to drainage, urine watermelon. CBI on slow rate. Ext: NT, No erythema  Lab Results: Recent Labs    04/27/23 0028 04/27/23 0630 04/28/23 0459  HGB 12.1* 11.8* 10.4*  HCT 34.8* 36.3* 31.4*   BMET Recent Labs    04/27/23 0630 04/28/23 0459  NA 135 136  K 4.1 3.5  CL 102 105  CO2 22 24  GLUCOSE 113* 132*  BUN 27* 22  CREATININE 1.49* 1.49*  CALCIUM 9.3 8.9     Studies/Results: CT Renal Stone Study  Result Date: 04/26/2023 CLINICAL DATA:  Status post right ureteral stent placement 04/25/2023 now with urinary retention EXAM: CT ABDOMEN AND PELVIS WITHOUT CONTRAST TECHNIQUE: Multidetector CT imaging of the abdomen and pelvis was performed following the standard protocol without IV contrast. RADIATION DOSE REDUCTION: This exam was performed according to the departmental dose-optimization program which includes automated exposure control, adjustment of the mA and/or kV according to patient size and/or use of iterative reconstruction technique. COMPARISON:  CT abdomen and pelvis dated 04/24/2023 FINDINGS: Lower chest: No focal consolidation or pulmonary nodule in the lung bases. New trace bilateral pleural effusions. Partially imaged heart size is normal.  Coronary artery calcifications. Hepatobiliary: No focal hepatic lesions. No intra or extrahepatic biliary ductal dilation. Cholelithiasis. New mild surrounding stranding. Pancreas: No focal lesions or main ductal dilation. Spleen: Enlarged in the AP dimension, 15.1 cm, previously 14.1 cm. Adrenals/Urinary Tract: No adrenal nodules. Right ureteral stent in-situ with proximal pigtail in the upper pole calyces and distal pigtail within the urinary bladder. Similar position of 6 mm stone within the distal right ureter adjacent to the stent (3:66). Unchanged 5 mm stone within the left renal pelvis (3:37). Unchanged additional nonobstructing stone within the left upper pole. No hydronephrosis. Right interpolar simple-appearing cysts. No specific follow-up imaging recommended. Urinary bladder is decompressed with catheter in-situ and contains small volume air. Stomach/Bowel: Normal appearance of the stomach. No evidence of bowel wall thickening, distention, or inflammatory changes. Normal appendix. Vascular/Lymphatic: Aortic atherosclerosis. Prior right iliac embolization. Aorto bi-iliac stent in-situ. Infrarenal abdominal aortic aneurysm measures 6.8 x 4.9 cm, unchanged. 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 Reproductive: Normal prostate size.  Brachytherapy seeds. Other: Small volume pelvic free fluid. No free air or fluid collection. Musculoskeletal: No acute or abnormal lytic or blastic osseous lesions. Prior L2-3 spinal fixation. Hardware appears intact. Multilevel degenerative changes of the partially imaged thoracic and lumbar spine. Small fat-containing bilateral inguinal and paraumbilical hernias. IMPRESSION: 1. Right ureteral stent in-situ with proximal pigtail in the upper pole calyces and distal pigtail within the urinary bladder. Similar position of 6 mm stone within the distal right ureter adjacent to the stent. 2. Unchanged 5 mm stone within the left renal pelvis. Additional left  nephrolithiasis. 3.  Cholelithiasis with new mild surrounding stranding, which may be secondary to overall volume status. Recommend correlation with physical examination for right upper quadrant tenderness. 4. New trace bilateral pleural effusions. 5. Unchanged infrarenal abdominal aortic aneurysm measuring up to 6.8 cm with aorto bi-iliac stent in-situ. Recommend clinical follow-up as per vascular specialist. 6. Splenomegaly. 7. Aortic Atherosclerosis (ICD10-I70.0). Coronary artery calcifications. Assessment for potential risk factor modification, dietary therapy or pharmacologic therapy may be warranted, if clinically indicated. Electronically Signed   By: Agustin Cree M.D.   On: 04/26/2023 10:00    Assessment/Plan: #Gross hematuria - Continue foley catheter to drainage. Titrate CBI to light pink. Currently on slow rate and urine watermelon colored - Manually irrigate catheter PRN for sluggish flow or occlusion. [facility]rology if unable to restore flow or difficulties irrigating - Pending clinical course, would consider definitive stone management while inpatient. At this time, will defer OR given clinical improvement. - Continue to hold [facility]; appreciate Cardiology recs on timeline for holding - OK for regular diet today   LOS: 0 days   Carlus Pavlov 04/28/2023, 8:51 AM

## 2023-04-28 NOTE — Plan of Care (Signed)
  Problem: Education: Goal: Knowledge of General Education information will improve Description: Including pain rating scale, medication(s)/side effects and non-pharmacologic comfort measures Outcome: Not Progressing   Problem: Health Behavior/Discharge Planning: Goal: Ability to manage health-related needs will improve Outcome: Not Progressing   

## 2023-04-29 DIAGNOSIS — I10 Essential (primary) hypertension: Secondary | ICD-10-CM | POA: Diagnosis not present

## 2023-04-29 DIAGNOSIS — N132 Hydronephrosis with renal and ureteral calculous obstruction: Secondary | ICD-10-CM | POA: Diagnosis not present

## 2023-04-29 DIAGNOSIS — R310 Gross hematuria: Secondary | ICD-10-CM | POA: Diagnosis not present

## 2023-04-29 LAB — CULTURE, BLOOD (ROUTINE X 2)

## 2023-04-29 LAB — CBC
HCT: 32 % — ABNORMAL LOW (ref 39.0–52.0)
Hemoglobin: 10.8 g/dL — ABNORMAL LOW (ref 13.0–17.0)
MCH: 29.8 pg (ref 26.0–34.0)
MCHC: 33.8 g/dL (ref 30.0–36.0)
MCV: 88.2 fL (ref 80.0–100.0)
Platelets: 157 10*3/uL (ref 150–400)
RBC: 3.63 MIL/uL — ABNORMAL LOW (ref 4.22–5.81)
RDW: 13.2 % (ref 11.5–15.5)
WBC: 4 10*3/uL (ref 4.0–10.5)
nRBC: 0 % (ref 0.0–0.2)

## 2023-04-29 LAB — BASIC METABOLIC PANEL
Anion gap: 7 (ref 5–15)
BUN: 21 mg/dL (ref 8–23)
CO2: 25 mmol/L (ref 22–32)
Calcium: 9.3 mg/dL (ref 8.9–10.3)
Chloride: 104 mmol/L (ref 98–111)
Creatinine, Ser: 1.57 mg/dL — ABNORMAL HIGH (ref 0.61–1.24)
GFR, Estimated: 44 mL/min — ABNORMAL LOW (ref >60)
Glucose, Bld: 138 mg/dL — ABNORMAL HIGH (ref 70–99)
Potassium: 3.9 mmol/L (ref 3.5–5.1)
Sodium: 136 mmol/L (ref 135–145)

## 2023-04-29 LAB — GLUCOSE, CAPILLARY
Glucose-Capillary: 102 mg/dL — ABNORMAL HIGH (ref 70–99)
Glucose-Capillary: 228 mg/dL — ABNORMAL HIGH (ref 70–99)
Glucose-Capillary: 100 mg/dL — ABNORMAL HIGH (ref 70–99)
Glucose-Capillary: 299 mg/dL — ABNORMAL HIGH (ref 70–99)

## 2023-04-29 LAB — MAGNESIUM: Magnesium: 1.9 mg/dL (ref 1.7–2.4)

## 2023-04-29 MED ORDER — KETOROLAC TROMETHAMINE 15 MG/ML IJ SOLN
15.0000 mg | Freq: Once | INTRAMUSCULAR | Status: AC
  Administered 2023-04-29: 15 mg via INTRAVENOUS
  Filled 2023-04-29: qty 1

## 2023-04-29 MED ORDER — LOPERAMIDE HCL 2 MG PO CAPS
2.0000 mg | ORAL_CAPSULE | Freq: Four times a day (QID) | ORAL | Status: DC | PRN
  Administered 2023-04-30: 2 mg via ORAL
  Filled 2023-04-29: qty 1

## 2023-04-29 MED ORDER — ACETAMINOPHEN 325 MG PO TABS: 650.0000 mg | ORAL_TABLET | Freq: Once | ORAL | Status: AC

## 2023-04-29 NOTE — Progress Notes (Addendum)
PROGRESS NOTE    Garrett Jordan  HYQ:[id number] DOB: 01-Oct-1941 DOA: 04/26/2023 PCP: [name], MD   Brief Narrative:  82 y.o. male with medical history significant for hypertension, type 2 diabetes mellitus, CKD 3B, CAD, PAF on Eliquis, AAA, and recent admission for obstructing right ureteral stone complicated by sepsis secondary to pyelonephritis requiring cystoscopy with right ureteral stent placement on 04/25/2023 and discharged home on oral Bactrim presented with bloody urine leaking around the catheter and suprapubic pain with abdominal distention and diarrhea.  On presentation, creatinine was 1.67.  Urology was consulted and he was started on continuous bladder irrigation.  Assessment & Plan:   Gross hematuria UTI: Present on admission History of recent pyelonephritis secondary to right ureteral stone requiring cystoscopy and right ureteral stent placement -Urology was consulted and he was started on continuous bladder irrigation.  Follow urology recommendations. -Continue Rocephin.  Follow urine cultures: Negative so far -Plavix and Eliquis on hold. -Monitor hemoglobin.  10.8 today.  Paroxysmal A-fib -Mild intermittent bradycardia present.  Eliquis on hold.  Continue Coreg.  Outpatient follow-up with cardiology  CAD Hypertension hyperlipidemia -Continue statin, amlodipine, Coreg, irbesartan, isosorbide mononitrate.  Outpatient follow-up with cardiology  CKD stage IIIb -Creatinine currently stable  Diabetes mellitus type 2 with hyperglycemia -A1c 6.1 earlier this month.  Continue CBGs with SSI.  Hypothyroidism -Continue levothyroxine  Thrombocytopenia -Resolved  Leukopenia -Resolved  Hyponatremia -Resolved  Acute metabolic acidosis -Resolved  Diarrhea--questionable cause.  Stool for C. difficile negative.  Use Imodium as needed    DVT prophylaxis: SCDs Code Status: Full Family Communication: None at bedside Disposition Plan: Status is: inpatient  because: Of need for bladder irrigation.    Consultants: Urology  Procedures: None  Antimicrobials: Rocephin from 04/27/2023 onwards   Subjective: Patient seen and examined at bedside.  No chest pain, shortness of breath, fever or vomiting reported.  Complains of some headache. Objective: Vitals:   04/28/23 1157 04/28/23 2123 04/29/23 0459 04/29/23 0557  BP: 138/81 130/83 135/75   Pulse: 71 64 (!) 59   Resp: 17 18 17    Temp: 98 F (36.7 C) 98.1 F (36.7 C) 98 F (36.7 C)   TempSrc: Oral Oral Oral   SpO2: 98% 97% 97%   Weight:    86 kg  Height:        Intake/Output Summary (Last 24 hours) at 04/29/2023 0810 Last data filed at 04/29/2023 0602 Gross per 24 hour  Intake 340 ml  Output 5276 ml  Net -4936 ml    Filed Weights   04/27/23 0545 04/28/23 0500 04/29/23 0557  Weight: 86.9 kg 86 kg 86 kg    Examination:  General: Currently on room air.  No distress.  Chronically ill and deconditioned looking. ENT/neck: No thyromegaly.  JVD is not elevated  respiratory: Decreased breath sounds at bases bilaterally with some crackles; no wheezing CVS: S1-S2 heard, mild intermittent bradycardia present  abdominal: Soft, nontender, slightly distended; no organomegaly, bowel sounds are heard Extremities: Trace lower extremity edema; no cyanosis  CNS: Awake and alert.  No focal neurologic deficit.  Moves extremities Lymph: No obvious lymphadenopathy Skin: No obvious ecchymosis/lesions  psych: Affect, judgment and mood are normal  musculoskeletal: No obvious joint swelling/deformity Genitourinary: Foley catheter present     Data Reviewed: I have personally reviewed following labs and imaging studies  CBC: Recent Labs  Lab 04/24/23 1613 04/24/23 1620 04/26/23 0609 04/27/23 0028 04/27/23 0630 04/28/23 0459 04/29/23 0430  WBC 3.5*   < > 3.0* 4.1 3.5*  3.7* 4.0  NEUTROABS 3.2  --   --  3.3  --   --   --   HGB 15.0   < > 12.3* 12.1* 11.8* 10.4* 10.8*  HCT 44.8   < > 37.2*  34.8* 36.3* 31.4* 32.0*  MCV 88.0   < > 88.6 85.9 89.2 88.7 88.2  PLT 146*   < > 121* 155 140* 143* 157   < > = values in this interval not displayed.    Basic Metabolic Panel: Recent Labs  Lab 04/26/23 0609 04/27/23 0028 04/27/23 0630 04/28/23 0459 04/29/23 0430  NA 136 134* 135 136 136  K 3.7 4.0 4.1 3.5 3.9  CL 102 102 102 105 104  CO2 19* 19* 22 24 25   GLUCOSE 130* 130* 113* 132* 138*  BUN 29* 31* 27* 22 21  CREATININE 1.72* 1.67* 1.49* 1.49* 1.57*  CALCIUM 9.6 9.7 9.3 8.9 9.3  MG  --   --   --   --  1.9    GFR: Estimated Creatinine Clearance: 40.8 mL/min (A) (by C-G formula based on SCr of 1.57 mg/dL (H)). Liver Function Tests: Recent Labs  Lab 04/24/23 1613  AST 20  ALT 16  ALKPHOS 79  BILITOT 2.1*  PROT 7.0  ALBUMIN 3.8    No results for input(s): "LIPASE", "AMYLASE" in the last 168 hours. No results for input(s): "AMMONIA" in the last 168 hours. Coagulation Profile: Recent Labs  Lab 04/24/23 1613  INR 2.0*    Cardiac Enzymes: No results for input(s): "CKTOTAL", "CKMB", "CKMBINDEX", "TROPONINI" in the last 168 hours. BNP (last 3 results) No results for input(s): "PROBNP" in the last 8760 hours. HbA1C: No results for input(s): "HGBA1C" in the last 72 hours. CBG: Recent Labs  Lab 04/28/23 0732 04/28/23 1216 04/28/23 1621 04/28/23 2120 04/29/23 0748  GLUCAP 100* 168* 174* 184* 102*    Lipid Profile: No results for input(s): "CHOL", "HDL", "LDLCALC", "TRIG", "CHOLHDL", "LDLDIRECT" in the last 72 hours. Thyroid Function Tests: No results for input(s): "TSH", "T4TOTAL", "FREET4", "T3FREE", "THYROIDAB" in the last 72 hours. Anemia Panel: No results for input(s): "VITAMINB12", "FOLATE", "FERRITIN", "TIBC", "IRON", "RETICCTPCT" in the last 72 hours. Sepsis Labs: Recent Labs  Lab 04/24/23 1613 04/24/23 2049  LATICACIDVEN 3.4* 2.2*     Recent Results (from the past 240 hour(s))  Culture, blood (Routine x 2)     Status: None   Collection  Time: 04/24/23  4:13 PM   Specimen: BLOOD  Result Value Ref Range Status   Specimen Description BLOOD RIGHT ANTECUBITAL  Final   Special Requests   Final    BOTTLES DRAWN AEROBIC AND ANAEROBIC Blood Culture results may not be optimal due to an excessive volume of blood received in culture bottles   Culture   Final    NO GROWTH 5 DAYS Performed at [facility], [address], [place], [place] [address]    Report Status 04/29/2023 FINAL  Final  Resp panel by RT-PCR (RSV, Flu A&B, Covid) Anterior Nasal Swab     Status: None   Collection Time: 04/24/23  4:56 PM   Specimen: Anterior Nasal Swab  Result Value Ref Range Status   SARS Coronavirus 2 by RT PCR NEGATIVE NEGATIVE Final   Influenza A by PCR NEGATIVE NEGATIVE Final   Influenza B by PCR NEGATIVE NEGATIVE Final    Comment: (NOTE) The Xpert Xpress SARS-CoV-2/FLU/RSV plus assay is intended as an aid in the diagnosis of influenza from Nasopharyngeal swab specimens and should  not be used as a sole basis for treatment. Nasal washings and aspirates are unacceptable for Xpert Xpress SARS-CoV-2/FLU/RSV testing.  Fact Sheet for Patients: BloggerCourse.com  Fact Sheet for Healthcare Providers: SeriousBroker.it  This test is not yet approved or cleared by the [place] FDA and has been authorized for detection and/or diagnosis of SARS-CoV-2 by FDA under an Emergency Use Authorization (EUA). This EUA will remain in effect (meaning this test can be used) for the duration of the COVID-19 declaration under Section 564(b)(1) of the Act, 21 U.S.C. section 360bbb-3(b)(1), unless the authorization is terminated or revoked.     Resp Syncytial Virus by PCR NEGATIVE NEGATIVE Final    Comment: (NOTE) Fact Sheet for Patients: BloggerCourse.com  Fact Sheet for Healthcare Providers: SeriousBroker.it  This test is not yet approved or  cleared by the [place] FDA and has been authorized for detection and/or diagnosis of SARS-CoV-2 by FDA under an Emergency Use Authorization (EUA). This EUA will remain in effect (meaning this test can be used) for the duration of the COVID-19 declaration under Section 564(b)(1) of the Act, 21 U.S.C. section 360bbb-3(b)(1), unless the authorization is terminated or revoked.  Performed at [facility], [address], [place], [place] [address]   Urine Culture     Status: None   Collection Time: 04/24/23  8:41 PM   Specimen: Urine, Random  Result Value Ref Range Status   Specimen Description URINE, RANDOM  Final   Special Requests NONE Reflexed from [id number]  Final   Culture   Final    NO GROWTH Performed at [facility], [address] [place], [place] [address]    Report Status 04/25/2023 FINAL  Final  Urine Culture (for pregnant, neutropenic or urologic patients or patients with an indwelling urinary catheter)     Status: None   Collection Time: 04/24/23  8:42 PM   Specimen: Urine, Clean Catch  Result Value Ref Range Status   Specimen Description URINE, CLEAN CATCH  Final   Special Requests NONE  Final   Culture   Final    NO GROWTH Performed at [facility], [address] [place], [place] [address]    Report Status 04/25/2023 FINAL  Final  Blood Culture (routine x 2)     Status: None (Preliminary result)   Collection Time: 04/25/23  3:06 AM   Specimen: BLOOD  Result Value Ref Range Status   Specimen Description BLOOD BLOOD RIGHT ARM  Final   Special Requests   Final    BOTTLES DRAWN AEROBIC AND ANAEROBIC Blood Culture adequate volume   Culture   Final    NO GROWTH 4 DAYS Performed at [facility], [address], [place], [place] [address]    Report Status PENDING  Incomplete  Urine Culture (for pregnant, neutropenic or urologic patients or patients with an indwelling urinary catheter)     Status: None   Collection Time: 04/27/23  9:07 AM    Specimen: Urine, Catheterized  Result Value Ref Range Status   Specimen Description   Final    URINE, CATHETERIZED Performed at [facility], [address], [place], [place] [address]    Special Requests   Final    NONE Performed at [facility], [address], [place], [place] [address]    Culture   Final    NO GROWTH Performed at [facility], [address] [place], [place] [address]    Report  Status 04/28/2023 FINAL  Final  C Difficile Quick Screen w PCR reflex     Status: None   Collection Time: 04/27/23  9:55 AM   Specimen: STOOL  Result Value Ref Range Status   C Diff antigen NEGATIVE NEGATIVE Final   C Diff toxin NEGATIVE NEGATIVE Final   C Diff interpretation No C. difficile detected.  Final    Comment: Performed at [facility], [address], [place], [place] [address]         Radiology Studies: No results found.      Scheduled Meds:  amLODipine  10 mg Oral Daily   atorvastatin  40 mg Oral Daily   carvedilol  6.25 mg Oral BID   Chlorhexidine Gluconate Cloth  6 each Topical Daily   clonazePAM  0.5 mg Oral QHS   insulin aspart  0-5 Units Subcutaneous QHS   insulin aspart  0-6 Units Subcutaneous TID WC   irbesartan  300 mg Oral Daily   isosorbide mononitrate  30 mg Oral Daily   levothyroxine  175 mcg Oral Q0600   pantoprazole  40 mg Oral QHS   sodium chloride flush  3 mL Intravenous Q12H   Continuous Infusions:  cefTRIAXone (ROCEPHIN)  IV 1 g (04/29/23 0600)   sodium chloride irrigation            Glade Lloyd, MD [facility] 04/29/2023, 8:10 AM

## 2023-04-29 NOTE — Evaluation (Signed)
Physical Therapy Evaluation-1x Patient Details Name: Garrett FRANKHOUSER Sr. MRN: [id number] DOB: [birth year] Today's Date: 04/29/2023  History of Present Illness  82 yo male admitted with gross hematuria, difficulty voiding, UTI. Hx of ureteral stent placement 04/25/2023 with discharge from hospital that day. Other hx: Afib, CABG, AAA, DM, gout, COVID, CKD, MI  Clinical Impression  Pt was Mod Ind with mobility. Reports having a headache that's been lingering for a few days. He has been mobilizing in his room unassisted. No acute PT needs. 1x eval. Will sign off.        Recommendations for follow up therapy are one component of a multi-disciplinary discharge planning process, led by the attending physician.  Recommendations may be updated based on patient status, additional functional criteria and insurance authorization.  Follow Up Recommendations       Assistance Recommended at Discharge None  Patient can return home with the following       Equipment Recommendations None recommended by PT  Recommendations for Other Services       Functional Status Assessment Patient has had a recent decline in their functional status and demonstrates the ability to make significant improvements in function in a reasonable and predictable amount of time.     Precautions / Restrictions Precautions Precautions: Fall Restrictions Weight Bearing Restrictions: No      Mobility  Bed Mobility Overal bed mobility: Modified Independent                  Transfers Overall transfer level: Modified independent                      Ambulation/Gait Ambulation/Gait assistance: Modified independent (Device/Increase time) Gait Distance (Feet): 250 Feet Assistive device: None Gait Pattern/deviations: Step-through pattern, Decreased stride length       General Gait Details: intermittent use of hallway handrail. mildly unsteady but no LOB. headache possibly factoring in  Stairs             Wheelchair Mobility    Modified Rankin (Stroke Patients Only)       Balance Overall balance assessment: Mild deficits observed, not formally tested                                           Pertinent Vitals/Pain Pain Assessment Pain Assessment: Faces Faces Pain Scale: Hurts even more Pain Location: headache Pain Descriptors / Indicators: Headache Pain Intervention(s): Monitored during session    Home Living Family/patient expects to be discharged to:: Private residence Living Arrangements: Alone Available Help at Discharge: Family;Available PRN/intermittently Type of Home: House Home Access: Level entry       Home Layout: One level Home Equipment: Agricultural consultant (2 wheels);Cane - single point      Prior Function Prior Level of Function : Independent/Modified Independent;Driving             Mobility Comments: ind ADLs Comments: ind     Hand Dominance   Dominant Hand: Right    Extremity/Trunk Assessment   Upper Extremity Assessment Upper Extremity Assessment: Overall WFL for tasks assessed    Lower Extremity Assessment Lower Extremity Assessment: Overall WFL for tasks assessed    Cervical / Trunk Assessment Cervical / Trunk Assessment: Normal  Communication   Communication: No difficulties  Cognition Arousal/Alertness: Awake/alert Behavior During Therapy: WFL for tasks assessed/performed Overall Cognitive Status: Within Functional Limits for tasks assessed  General Comments      Exercises     Assessment/Plan    PT Assessment Patient does not need any further PT services  PT Problem List         PT Treatment Interventions      PT Goals (Current goals can be found in the Care Plan section)  Acute Rehab PT Goals Patient Stated Goal: feel better PT Goal Formulation: All assessment and education complete, DC therapy    Frequency       Co-evaluation                AM-PAC PT "6 Clicks" Mobility  Outcome Measure Help needed turning from your back to your side while in a flat bed without using bedrails?: None Help needed moving from lying on your back to sitting on the side of a flat bed without using bedrails?: None Help needed moving to and from a bed to a chair (including a wheelchair)?: None Help needed standing up from a chair using your arms (e.g., wheelchair or bedside chair)?: None Help needed to walk in hospital room?: None Help needed climbing 3-5 steps with a railing? : None 6 Click Score: 24    End of Session   Activity Tolerance: Patient tolerated treatment well Patient left: in chair;with call bell/phone within reach        Time: 1320-1329 PT Time Calculation (min) (ACUTE ONLY): 9 min   Charges:   PT Evaluation $PT Eval Low Complexity: 1 Low            Faye Ramsay, PT Acute Rehabilitation  Office: [phone]

## 2023-04-29 NOTE — Progress Notes (Signed)
  Subjective: CBI resumed yesterday by RN. Urine essentially clear and thin on light CBI. CBI clamped on AM rounds.  Objective: Vital signs in last 24 hours: Temp:  [98 F (36.7 C)-98.7 F (37.1 C)] 98 F (36.7 C) (06/16 0459) Pulse Rate:  [59-77] 59 (06/16 0459) Resp:  [17-18] 17 (06/16 0459) BP: (128-138)/(74-83) 135/75 (06/16 0459) SpO2:  [97 %-98 %] 97 % (06/16 0459) Weight:  [86 kg] 86 kg (06/16 0557)  Intake/Output from previous day: 06/15 0701 - 06/16 0700 In: 340 [P.O.:340] Out: 7326 [Urine:7325; Stool:1] Intake/Output this shift: No intake/output data recorded.  Physical Exam:  General: Alert and oriented CV: RRR Lungs: Clear Abdomen: Soft, ND GU: foley catheter to drainage, urine very light pink with some sediemtn. CBI clamped. Ext: NT, No erythema  Lab Results: Recent Labs    04/27/23 0630 04/28/23 0459 04/29/23 0430  HGB 11.8* 10.4* 10.8*  HCT 36.3* 31.4* 32.0*    BMET Recent Labs    04/28/23 0459 04/29/23 0430  NA 136 136  K 3.5 3.9  CL 105 104  CO2 24 25  GLUCOSE 132* 138*  BUN 22 21  CREATININE 1.49* 1.57*  CALCIUM 8.9 9.3      Studies/Results: No results found.  Assessment/Plan: #Gross hematuria - Continue foley catheter to drainage. Titrate CBI to light pink. Currently clamped. Keep clamped to assess quality of urine - Manually irrigate catheter PRN for sluggish flow or occlusion. [facility] if unable to restore flow or difficulties irrigating - Pending clinical course, would consider definitive stone management while inpatient. At this time, will defer OR given clinical improvement. - Continue to hold [facility]; appreciate Cardiology recs on timeline for holding - OK for regular diet today   LOS: 1 day   Garrett Jordan 04/29/2023, 8:43 AM

## 2023-04-29 NOTE — Plan of Care (Signed)
  Problem: Education: Goal: Knowledge of General Education information will improve Description: Including pain rating scale, medication(s)/side effects and non-pharmacologic comfort measures Outcome: Not Progressing   Problem: Health Behavior/Discharge Planning: Goal: Ability to manage health-related needs will improve Outcome: Not Progressing   

## 2023-04-30 ENCOUNTER — Other Ambulatory Visit: Payer: Self-pay | Admitting: Urology

## 2023-04-30 DIAGNOSIS — Z7901 Long term (current) use of anticoagulants: Secondary | ICD-10-CM | POA: Diagnosis not present

## 2023-04-30 DIAGNOSIS — I480 Paroxysmal atrial fibrillation: Secondary | ICD-10-CM | POA: Diagnosis not present

## 2023-04-30 DIAGNOSIS — Z5181 Encounter for therapeutic drug level monitoring: Secondary | ICD-10-CM | POA: Diagnosis not present

## 2023-04-30 DIAGNOSIS — R310 Gross hematuria: Secondary | ICD-10-CM | POA: Diagnosis not present

## 2023-04-30 LAB — CBC WITH DIFFERENTIAL/PLATELET
Abs Immature Granulocytes: 0.13 10*3/uL — ABNORMAL HIGH (ref 0.00–0.07)
Basophils Absolute: 0 10*3/uL (ref 0.0–0.1)
Basophils Relative: 1 %
Eosinophils Absolute: 0.2 10*3/uL (ref 0.0–0.5)
Eosinophils Relative: 5 %
HCT: 32.7 % — ABNORMAL LOW (ref 39.0–52.0)
Hemoglobin: 10.7 g/dL — ABNORMAL LOW (ref 13.0–17.0)
Immature Granulocytes: 3 %
Lymphocytes Relative: 24 %
Lymphs Abs: 1.1 10*3/uL (ref 0.7–4.0)
MCH: 29 pg (ref 26.0–34.0)
MCHC: 32.7 g/dL (ref 30.0–36.0)
MCV: 88.6 fL (ref 80.0–100.0)
Monocytes Absolute: 0.7 10*3/uL (ref 0.1–1.0)
Monocytes Relative: 15 %
Neutro Abs: 2.5 10*3/uL (ref 1.7–7.7)
Neutrophils Relative %: 52 %
Platelets: 177 10*3/uL (ref 150–400)
RBC: 3.69 MIL/uL — ABNORMAL LOW (ref 4.22–5.81)
RDW: 13.2 % (ref 11.5–15.5)
WBC: 4.7 10*3/uL (ref 4.0–10.5)
nRBC: 0 % (ref 0.0–0.2)

## 2023-04-30 LAB — BASIC METABOLIC PANEL
Anion gap: 7 (ref 5–15)
BUN: 22 mg/dL (ref 8–23)
CO2: 23 mmol/L (ref 22–32)
Calcium: 9.4 mg/dL (ref 8.9–10.3)
Chloride: 104 mmol/L (ref 98–111)
Creatinine, Ser: 1.36 mg/dL — ABNORMAL HIGH (ref 0.61–1.24)
GFR, Estimated: 53 mL/min — ABNORMAL LOW (ref >60)
Glucose, Bld: 144 mg/dL — ABNORMAL HIGH (ref 70–99)
Potassium: 3.4 mmol/L — ABNORMAL LOW (ref 3.5–5.1)
Sodium: 134 mmol/L — ABNORMAL LOW (ref 135–145)

## 2023-04-30 LAB — GLUCOSE, CAPILLARY
Glucose-Capillary: 123 mg/dL — ABNORMAL HIGH (ref 70–99)
Glucose-Capillary: 200 mg/dL — ABNORMAL HIGH (ref 70–99)
Glucose-Capillary: 166 mg/dL — ABNORMAL HIGH (ref 70–99)
Glucose-Capillary: 113 mg/dL — ABNORMAL HIGH (ref 70–99)

## 2023-04-30 LAB — MAGNESIUM: Magnesium: 1.8 mg/dL (ref 1.7–2.4)

## 2023-04-30 MED ORDER — ENOXAPARIN SODIUM 80 MG/0.8ML IJ SOSY: 80.0000 mg | PREFILLED_SYRINGE | INTRAMUSCULAR | Status: DC

## 2023-04-30 MED ORDER — ENOXAPARIN SODIUM 80 MG/0.8ML IJ SOSY
80.0000 mg | PREFILLED_SYRINGE | Freq: Two times a day (BID) | INTRAMUSCULAR | Status: DC
  Administered 2023-04-30: 80 mg via SUBCUTANEOUS

## 2023-04-30 MED ORDER — POTASSIUM CHLORIDE CRYS ER 20 MEQ PO TBCR
40.0000 meq | EXTENDED_RELEASE_TABLET | Freq: Once | ORAL | Status: AC
  Administered 2023-04-30: 40 meq via ORAL
  Filled 2023-04-30: qty 2

## 2023-04-30 MED ORDER — ENOXAPARIN SODIUM 80 MG/0.8ML IJ SOSY
80.0000 mg | PREFILLED_SYRINGE | Freq: Two times a day (BID) | INTRAMUSCULAR | Status: AC
  Administered 2023-05-01 – 2023-05-02 (×5): 80 mg via SUBCUTANEOUS
  Filled 2023-04-30 (×7): qty 0.8

## 2023-04-30 MED ORDER — ENOXAPARIN SODIUM 80 MG/0.8ML IJ SOSY: 80.0000 mg | PREFILLED_SYRINGE | Freq: Two times a day (BID) | INTRAMUSCULAR | Status: DC

## 2023-04-30 NOTE — Progress Notes (Signed)
  Subjective: CBI resumed yesterday by RN. Urine essentially clear and thin on light CBI. CBI clamped on AM rounds.  Objective: Vital signs in last 24 hours: Temp:  [97.9 F (36.6 C)-98 F (36.7 C)] 98 F (36.7 C) (06/17 0501) Pulse Rate:  [57-60] 57 (06/17 0501) Resp:  [14-18] 18 (06/17 0501) BP: (127-130)/(76-78) 130/78 (06/17 0501) SpO2:  [98 %-99 %] 98 % (06/17 0501) Weight:  [81.7 kg] 81.7 kg (06/17 0609)  Intake/Output from previous day: 06/16 0701 - 06/17 0700 In: 563 [P.O.:360; I.V.:3; IV Piggyback:200] Out: 2400 [Urine:2400] Intake/Output this shift: Total I/O In: -  Out: 550 [Urine:550]  Physical Exam:  General: Alert and oriented CV: RRR Lungs: Clear Abdomen: Soft, ND GU: foley catheter to drainage, urine very light pink with some sediemtn. CBI clamped. Ext: NT, No erythema  Lab Results: Recent Labs    04/28/23 0459 04/29/23 0430 04/30/23 0358  HGB 10.4* 10.8* 10.7*  HCT 31.4* 32.0* 32.7*   BMET Recent Labs    04/29/23 0430 04/30/23 0358  NA 136 134*  K 3.9 3.4*  CL 104 104  CO2 25 23  GLUCOSE 138* 144*  BUN 21 22  CREATININE 1.57* 1.36*  CALCIUM 9.3 9.4     Studies/Results: No results found.  Assessment/Plan: #Gross hematuria  - Continue foley catheter to drainage.  CBI has been clamped since yesterday and is clear on rounds this morning.  - Manually irrigate catheter PRN for sluggish flow or occlusion. [facility] if unable to restore flow or difficulties irrigating  -Discussed with multiple urologist and were able to get him moved up to Thursday.  He would not be able to restart medications and washout in time for treatment.  Cards to give direction on how long we can safely stop A/C.  If patient can safely stay off of antiplatelet/anticoagulant medications he is okay to discharge from urology perspective.  If he requires heparin infusion, then we will continue to check on him intermittently until Thursday.   LOS: 2 days   Scherrie Bateman Lyah Millirons 04/30/2023, 9:39 AM   Agree with above note.  Continue as needed irrigations, plan on cystoscopic management on Thursday.

## 2023-04-30 NOTE — Consult Note (Signed)
CARDIOLOGY CONSULT NOTE       Patient ID: Garrett Graves Sr. MRN: [id number] DOB/AGE: 1941/01/05 82 y.o.  Admit date: 04/26/2023 Referring Physician: Hillis Range Primary Physician: [name], MD Primary Cardiologist: Caro Hight Reason for Consultation: Anticoagulation Management  Principal Problem:   Gross hematuria Active Problems:   CAD (coronary artery disease)   Hypothyroidism   AAA (abdominal aortic aneurysm) without rupture (HCC)   Paroxysmal A-fib (HCC)   Type 2 diabetes mellitus with stage 3b chronic kidney disease, without long-term current use of insulin (HCC)   CKD (chronic kidney disease), stage III (HCC)   Ureteral stone with hydronephrosis   Essential hypertension   UTI (urinary tract infection)   Hematuria   HPI:  82 y.o. with history of CAD post CABG 2010 and ascending aortic aneurysm with stent graft MI 07/02/22 occluded SVG;s with unsuccessful PCI/ LIMA is patent EF preserved 55-60% No longer on plavix. He has had PAF. CHADVASC 5. Currently in afib with good rate control Eliquis held since last Thursday for gross hematuria Scheduled for surgery Thursday am for stone management. Hematuria has cleared off anticoagulation with foley drainage.   TTE 06/15/22 EF 55-60% no AS ascending root 44 mm  No angina   ROS All other systems reviewed and negative except as noted above  Past Medical History:  Diagnosis Date   AAA (abdominal aortic aneurysm) (HCC)    Arthritis    knees   Atrial fibrillation (HCC)    CAD (coronary artery disease)    Cancer (HCC)    prostate   CKD (chronic kidney disease), stage III (HCC) 06/18/2022   COVID    06/2019 and in 2022 states they were mild cases   Diabetes mellitus without complication (HCC)    Dyspnea    once in a while   Dysrhythmia    A-fib pt is on Eliquis   GERD (gastroesophageal reflux disease)    Gout    History of kidney stones    Hyperlipidemia    Hypertension    Hypothyroidism    Myocardial infarction (HCC)  01/1991   sp inferior   Nerve compression    right leg   Reflux    Seasonal allergies    Thoracic ascending aortic aneurysm (HCC)    Thyroid disease    hypothyroidism    Family History  Problem Relation Age of Onset   Heart disease Father        Heart Disease before age 77   Heart attack Father    Hyperlipidemia Father    Hypertension Father    Stroke Mother    Deep vein thrombosis Mother    Heart disease Sister    Diabetes Sister    Hyperlipidemia Sister    Hypertension Sister    Heart attack Sister    Heart disease Paternal Uncle    Diabetes Maternal Grandmother    Diabetes Sister     Social History   Socioeconomic History   Marital status: Divorced    Spouse name: Not on file   Number of children: Not on file   Years of education: 14   Highest education level: Associate degree: academic program  Occupational History   Occupation: Retired  Tobacco Use   Smoking status: Former    Types: Cigarettes    Quit date: 08/01/1970    Years since quitting: 52.7   Smokeless tobacco: Never  Vaping Use   Vaping Use: Never used  Substance and Sexual Activity   Alcohol use: Yes  Alcohol/week: 6.0 standard drinks of alcohol    Types: 3 Glasses of wine, 3 Cans of beer per week    Comment: 1-3 glasses 3-4 times a week   Drug use: No   Sexual activity: Not Currently  Other Topics Concern   Not on file  Social History Narrative   Not on file   Social Determinants of Health   Financial Resource Strain: Not on file  Food Insecurity: No Food Insecurity (04/27/2023)   Hunger Vital Sign    Worried About Running Out of Food in the Last Year: Never true    Ran Out of Food in the Last Year: Never true  Transportation Needs: No Transportation Needs (04/27/2023)   PRAPARE - Administrator, Civil Service (Medical): No    Lack of Transportation (Non-Medical): No  Physical Activity: Not on file  Stress: Not on file  Social Connections: Not on file  Intimate Partner  Violence: Not At Risk (04/27/2023)   Humiliation, Afraid, Rape, and Kick questionnaire    Fear of Current or Ex-Partner: No    Emotionally Abused: No    Physically Abused: No    Sexually Abused: No    Past Surgical History:  Procedure Laterality Date   ABDOMINAL AORTAGRAM N/A 02/28/2012   Procedure: ABDOMINAL AORTAGRAM;  Surgeon: Nada Libman, MD;  Location: [facility] CATH LAB;  Service: Cardiovascular;  Laterality: N/A;   abdominal aortagram embolization  02/28/2012   ABDOMINAL AORTIC ANEURYSM REPAIR  07/14/2012   CARDIAC CATHETERIZATION     CATARACT EXTRACTION Bilateral 2016   CORONARY ARTERY BYPASS GRAFT  04/13/2009   CORONARY BALLOON ANGIOPLASTY N/A 06/15/2022   Procedure: CORONARY BALLOON ANGIOPLASTY;  Surgeon: Tonny Bollman, MD;  Location: [facility] INVASIVE CV LAB;  Service: Cardiovascular;  Laterality: N/A;   CORONARY STENT PLACEMENT  1992 and  2009   RCA   CYSTOSCOPY W/ URETERAL STENT PLACEMENT Right 04/24/2023   Procedure: CYSTOSCOPY WITH RETROGRADE PYELOGRAM/URETERAL STENT PLACEMENT;  Surgeon: Crist Fat, MD;  Location: [facility] OR;  Service: Urology;  Laterality: Right;   EMBOLIZATION Right 02/28/2012   Procedure: EMBOLIZATION;  Surgeon: Nada Libman, MD;  Location: [facility] CATH LAB;  Service: Cardiovascular;  Laterality: Right;   IR GENERIC HISTORICAL  12/05/2016   IR RADIOLOGIST EVAL & MGMT 12/05/2016 Malachy Moan, MD [facility] INTERV RAD   IR RADIOLOGIST EVAL & MGMT  07/10/2019   IR RADIOLOGIST EVAL & MGMT  08/25/2020   IR RADIOLOGIST EVAL & MGMT  08/16/2021   IR RADIOLOGIST EVAL & MGMT  08/31/2022   KIDNEY STONE SURGERY     LAMINECTOMY WITH POSTERIOR LATERAL ARTHRODESIS LEVEL 1 Bilateral 11/10/2022   Procedure: Lumbar Two-Lumbar Three laminectomy  - bilateral with posterior lateral fusion and interspinous plating;  Surgeon: Tia Alert, MD;  Location: [facility] OR;  Service: Neurosurgery;  Laterality: Bilateral;   LEFT HEART CATH AND CORONARY ANGIOGRAPHY N/A 06/15/2022    Procedure: LEFT HEART CATH AND CORONARY ANGIOGRAPHY;  Surgeon: Tonny Bollman, MD;  Location: [facility] INVASIVE CV LAB;  Service: Cardiovascular;  Laterality: N/A;   LEFT HEART CATHETERIZATION WITH CORONARY/GRAFT ANGIOGRAM N/A 01/08/2015   Procedure: LEFT HEART CATHETERIZATION WITH Isabel Caprice;  Surgeon: Othella Boyer, MD;  Location: [facility] CATH LAB;  Service: Cardiovascular;  Laterality: N/A;   parathyroid adenoma  1981   surgery   PROSTATE SURGERY     rad seeds   SPINE SURGERY  2006   TONSILLECTOMY  1955      Current Facility-Administered Medications:  acetaminophen (TYLENOL) tablet 650 mg, 650 mg, Oral, Q6H PRN, 650 mg at 04/29/23 2114 **OR** acetaminophen (TYLENOL) suppository 650 mg, 650 mg, Rectal, Q6H PRN, Opyd, Lavone Neri, MD   amLODipine (NORVASC) tablet 10 mg, 10 mg, Oral, Daily, Opyd, Lavone Neri, MD, 10 mg at 04/30/23 0904   atorvastatin (LIPITOR) tablet 40 mg, 40 mg, Oral, Daily, Opyd, Lavone Neri, MD, 40 mg at 04/30/23 0904   carvedilol (COREG) tablet 6.25 mg, 6.25 mg, Oral, BID, Opyd, Lavone Neri, MD, 6.25 mg at 04/30/23 0905   cefTRIAXone (ROCEPHIN) 1 g in sodium chloride 0.9 % 100 mL IVPB, 1 g, Intravenous, Q24H, Opyd, Lavone Neri, MD, Last Rate: 200 mL/hr at 04/30/23 0546, 1 g at 04/30/23 0546   Chlorhexidine Gluconate Cloth 2 % PADS 6 each, 6 each, Topical, Daily, Marlin Canary U, DO, 6 each at 04/30/23 0905   clonazePAM (KLONOPIN) tablet 0.5 mg, 0.5 mg, Oral, QHS, Opyd, Lavone Neri, MD, 0.5 mg at 04/29/23 2113   insulin aspart (novoLOG) injection 0-5 Units, 0-5 Units, Subcutaneous, QHS, Opyd, Lavone Neri, MD, 3 Units at 04/29/23 2113   insulin aspart (novoLOG) injection 0-6 Units, 0-6 Units, Subcutaneous, TID WC, Opyd, Lavone Neri, MD, 2 Units at 04/29/23 1250   irbesartan (AVAPRO) tablet 300 mg, 300 mg, Oral, Daily, Opyd, Lavone Neri, MD, 300 mg at 04/30/23 0904   isosorbide mononitrate (IMDUR) 24 hr tablet 30 mg, 30 mg, Oral, Daily, Opyd, Lavone Neri, MD, 30 mg at 04/30/23 1610    levothyroxine (SYNTHROID) tablet 175 mcg, 175 mcg, Oral, Q0600, Opyd, Lavone Neri, MD, 175 mcg at 04/30/23 0542   loperamide (IMODIUM) capsule 2 mg, 2 mg, Oral, Q6H PRN, Hanley Ben, Kshitiz, MD   pantoprazole (PROTONIX) EC tablet 40 mg, 40 mg, Oral, QHS, Opyd, Lavone Neri, MD, 40 mg at 04/29/23 2113   sodium chloride flush (NS) 0.9 % injection 3 mL, 3 mL, Intravenous, Q12H, Opyd, Lavone Neri, MD, 3 mL at 04/29/23 2117   Continuous Bladder Irrigation, , , Until Discontinued **AND** sodium chloride irrigation 0.9 % 3,000 mL, 3,000 mL, Irrigation, Continuous, [place], Scherrie Bateman, NP, 3,000 mL at 04/27/23 1354  amLODipine  10 mg Oral Daily   atorvastatin  40 mg Oral Daily   carvedilol  6.25 mg Oral BID   Chlorhexidine Gluconate Cloth  6 each Topical Daily   clonazePAM  0.5 mg Oral QHS   insulin aspart  0-5 Units Subcutaneous QHS   insulin aspart  0-6 Units Subcutaneous TID WC   irbesartan  300 mg Oral Daily   isosorbide mononitrate  30 mg Oral Daily   levothyroxine  175 mcg Oral Q0600   pantoprazole  40 mg Oral QHS   sodium chloride flush  3 mL Intravenous Q12H    cefTRIAXone (ROCEPHIN)  IV 1 g (04/30/23 0546)   sodium chloride irrigation      Physical Exam: Blood pressure 130/78, pulse (!) 57, temperature 98 F (36.7 C), temperature source Oral, resp. rate 18, height 5\' 9"  (1.753 m), weight 81.7 kg, SpO2 98 %.   Elderly male Lungs clear Prior AAA surgery SEM no AR murmur Abdomen benign No edema Foley catheter   Labs:   Lab Results  Component Value Date   WBC 4.7 04/30/2023   HGB 10.7 (L) 04/30/2023   HCT 32.7 (L) 04/30/2023   MCV 88.6 04/30/2023   PLT 177 04/30/2023    Recent Labs  Lab 04/24/23 1613 04/24/23 1620 04/30/23 0358  NA 133*   < > 134*  K 4.8   < >  3.4*  CL 99   < > 104  CO2 19*   < > 23  BUN 27*   < > 22  CREATININE 2.29*   < > 1.36*  CALCIUM 10.0   < > 9.4  PROT 7.0  --   --   BILITOT 2.1*  --   --   ALKPHOS 79  --   --   ALT 16  --   --   AST 20  --   --    GLUCOSE 148*   < > 144*   < > = values in this interval not displayed.   Lab Results  Component Value Date   CKTOTAL 101 04/27/2009   CKMB 1.2 04/27/2009   TROPONINI <0.03 01/08/2015    Lab Results  Component Value Date   CHOL 99 (L) 08/11/2022   CHOL 149 06/15/2022   CHOL 137 07/15/2021   Lab Results  Component Value Date   HDL 32 (L) 08/11/2022   HDL 30 (L) 06/15/2022   HDL 29 (L) 07/15/2021   Lab Results  Component Value Date   LDLCALC 36 08/11/2022   LDLCALC 45 06/15/2022   LDLCALC 64 07/15/2021   Lab Results  Component Value Date   TRIG 192 (H) 08/11/2022   TRIG 372 (H) 06/15/2022   TRIG 278 (H) 07/15/2021   Lab Results  Component Value Date   CHOLHDL 3.1 08/11/2022   CHOLHDL 5.0 06/15/2022   CHOLHDL 4.7 07/15/2021   Lab Results  Component Value Date   LDLDIRECT 67 07/15/2021   LDLDIRECT 75 04/19/2021      Radiology: CT Renal Stone Study  Result Date: 04/26/2023 CLINICAL DATA:  Status post right ureteral stent placement 04/25/2023 now with urinary retention EXAM: CT ABDOMEN AND PELVIS WITHOUT CONTRAST TECHNIQUE: Multidetector CT imaging of the abdomen and pelvis was performed following the standard protocol without IV contrast. RADIATION DOSE REDUCTION: This exam was performed according to the departmental dose-optimization program which includes automated exposure control, adjustment of the mA and/or kV according to patient size and/or use of iterative reconstruction technique. COMPARISON:  CT abdomen and pelvis dated 04/24/2023 FINDINGS: Lower chest: No focal consolidation or pulmonary nodule in the lung bases. New trace bilateral pleural effusions. Partially imaged heart size is normal. Coronary artery calcifications. Hepatobiliary: No focal hepatic lesions. No intra or extrahepatic biliary ductal dilation. Cholelithiasis. New mild surrounding stranding. Pancreas: No focal lesions or main ductal dilation. Spleen: Enlarged in the AP dimension, 15.1 cm,  previously 14.1 cm. Adrenals/Urinary Tract: No adrenal nodules. Right ureteral stent in-situ with proximal pigtail in the upper pole calyces and distal pigtail within the urinary bladder. Similar position of 6 mm stone within the distal right ureter adjacent to the stent (3:66). Unchanged 5 mm stone within the left renal pelvis (3:37). Unchanged additional nonobstructing stone within the left upper pole. No hydronephrosis. Right interpolar simple-appearing cysts. No specific follow-up imaging recommended. Urinary bladder is decompressed with catheter in-situ and contains small volume air. Stomach/Bowel: Normal appearance of the stomach. No evidence of bowel wall thickening, distention, or inflammatory changes. Normal appendix. Vascular/Lymphatic: Aortic atherosclerosis. Prior right iliac embolization. Aorto bi-iliac stent in-situ. Infrarenal abdominal aortic aneurysm measures 6.8 x 4.9 cm, unchanged. 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 Reproductive: Normal prostate size.  Brachytherapy seeds. Other: Small volume pelvic free fluid. No free air or fluid collection. Musculoskeletal: No acute or abnormal lytic or blastic osseous lesions. Prior L2-3 spinal fixation. Hardware appears intact. Multilevel degenerative changes of the  partially imaged thoracic and lumbar spine. Small fat-containing bilateral inguinal and paraumbilical hernias. IMPRESSION: 1. Right ureteral stent in-situ with proximal pigtail in the upper pole calyces and distal pigtail within the urinary bladder. Similar position of 6 mm stone within the distal right ureter adjacent to the stent. 2. Unchanged 5 mm stone within the left renal pelvis. Additional left nephrolithiasis. 3. Cholelithiasis with new mild surrounding stranding, which may be secondary to overall volume status. Recommend correlation with physical examination for right upper quadrant tenderness. 4. New trace bilateral pleural effusions. 5. Unchanged infrarenal  abdominal aortic aneurysm measuring up to 6.8 cm with aorto bi-iliac stent in-situ. Recommend clinical follow-up as per vascular specialist. 6. Splenomegaly. 7. Aortic Atherosclerosis (ICD10-I70.0). Coronary artery calcifications. Assessment for potential risk factor modification, dietary therapy or pharmacologic therapy may be warranted, if clinically indicated. Electronically Signed   By: Agustin Cree M.D.   On: 04/26/2023 10:00   DG C-Arm 1-60 Min  Result Date: 04/25/2023 CLINICAL DATA:  Retrograde urography, intraoperative examination EXAM: DG C-ARM 1-60 MIN CONTRAST:  Not listed, refer to operative report. FLUOROSCOPY: Fluoroscopy Time:  24 seconds Radiation Exposure Index (if provided by the fluoroscopic device): 4.42 mGy Number of Acquired Spot Images: 3 COMPARISON:  CT 04/24/2023 FINDINGS: Initial image demonstrates retrograde urography with a few small angular filling defects within the distal right ureter likely representing residual debris. The 6 mm calculus noted on prior CT examination is not clearly identified, likely removed in the interval. No hydroureter. Subsequent images demonstrate contrast opacification of the right pelvicaliceal system and deployment of a ureteral stent extending from the upper pole calices to the bladder. No definite extraluminal extension of contrast. IMPRESSION: 1. Retrograde urography with right ureteral stent placement. No definite extraluminal extension of contrast. Electronically Signed   By: Helyn Numbers M.D.   On: 04/25/2023 02:22   CT Renal Stone Study  Result Date: 04/24/2023 CLINICAL DATA:  Abdominal/flank pain, stone suspected EXAM: CT ABDOMEN AND PELVIS WITHOUT CONTRAST TECHNIQUE: Multidetector CT imaging of the abdomen and pelvis was performed following the standard protocol without IV contrast. RADIATION DOSE REDUCTION: This exam was performed according to the departmental dose-optimization program which includes automated exposure control, adjustment of  the mA and/or kV according to patient size and/or use of iterative reconstruction technique. COMPARISON:  CTA 08/22/2022 FINDINGS: Lower chest: Mild dependent atelectasis. Trace right pleural effusion. Coronary artery calcifications. Mild cardiomegaly. Hepatobiliary: Diffuse hepatic steatosis. No evidence of focal liver lesion. Multiple layering stones in the gallbladder. No pericholecystic inflammation. No biliary dilatation. Pancreas: Parenchymal atrophy. No ductal dilatation or inflammation. Spleen: Mild splenomegaly, spleen measures 13.5 cm cranial caudal. Adrenals/Urinary Tract: No adrenal nodule. 8 x 8 mm stone in the distal right ureter with moderate proximal hydroureteronephrosis. Mild right perinephric edema. Punctate nonobstructing stone in the upper pole of the right kidney. Small right renal cyst, needs no further imaging follow-up. Intrarenal stones in the left kidney without left hydronephrosis. No left ureteral calculi. The urinary bladder is partially distended, no bladder stone. Stomach/Bowel: Minimal hiatal hernia. The stomach is nondistended there is no bowel obstruction or inflammation. Moderate volume of colonic stool. Sigmoid colonic redundancy. There is chronic fullness of the proximal appendix at 7 mm which tapers distally, no appendicitis. 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 The aneurysm sac measures 6.6 x 4.7 cm, previously 6.4 x 4.5 cm. Embolization coils within the aneurysm sac. There is no periaortic stranding. Coiling of the right internal iliac artery. 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 No abdominopelvic adenopathy. 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 Other: No ascites or free air. Small fat containing umbilical hernia. Musculoskeletal: Posterior rod with intrapedicular screws L2-L3. The right L3 pedicle screw extends lateral to the vertebral body cortex. Additional degenerative change in the lower lumbar  spine. No acute osseous abnormalities. IMPRESSION: 1. Obstructing 8 x 8 mm stone in the distal right ureter with moderate proximal hydroureteronephrosis. 2. Additional nonobstructing stones in both kidneys. 3. 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 The aneurysm sac measures 6.6 x 4.7 cm, previously 6.4 x 4.5 cm. Recommend annual imaging follow-up by CTA or MRA. 4. Hepatic steatosis.  Mild splenomegaly. 5. Cholelithiasis without CT findings of acute cholecystitis. 6. Trace right pleural effusion. Aortic Atherosclerosis (ICD10-I70.0). Electronically Signed   By: Narda Rutherford M.D.   On: 04/24/2023 19:14   DG Chest 2 View  Result Date: 04/24/2023 CLINICAL DATA:  Shortness of breath. EXAM: CHEST - 2 VIEW COMPARISON:  August 09, 2022 FINDINGS: Multiple sternal wires and vascular clips are seen. The heart size and mediastinal contours are within normal limits. There is mild calcification of the aortic arch. Both lungs are clear. The visualized skeletal structures are unremarkable. IMPRESSION: 1. Evidence of prior median sternotomy/CABG. 2. No active cardiopulmonary disease. Electronically Signed   By: Aram Candela M.D.   On: 04/24/2023 17:39    EKG: afib RBBB nonspecific ST changes   ASSESSMENT AND PLAN:   PAF:  CHADVASC 5 in afib currently off eliquis since last Thursday. Discussed with patient he prefers [place] Lovenox to iv heparin He indicated Dr Vernice Jefferson also preferred he stay till Thursday for surgery Will write for bid [place] lovenox per pharmacy consult If surgery Thursday am likely last dose Wednesday am per pharmacy CABG:  known occluded SVG patent lima no angina EF preserved stable AAA:  post EVAR stable  CKD:  likely contribution obstructing stone Cr stable 1.3  Signed: Charlton Haws 04/30/2023, 10:36 AM

## 2023-04-30 NOTE — Progress Notes (Addendum)
ANTICOAGULATION CONSULT NOTE - Initial Consult  Pharmacy Consult for Enoxaparin Indication: atrial fibrillation  Allergies  Allergen Reactions   Quinolones Other (See Comments)    Aortic root enlargement   Ace Inhibitors Cough   Crestor [Rosuvastatin] Other (See Comments)    Muscle aches   Nitroglycerin Other (See Comments)    Very pronounced lowered B/P with IV nitro for heart caths    Patient Measurements: Height: 5\' 9"  (175.3 cm) Weight: 81.7 kg (180 lb 1.9 oz) IBW/kg (Calculated) : 70.7   Vital Signs: Temp: 98 F (36.7 C) (06/17 0501) Temp Source: Oral (06/17 0501) BP: 130/78 (06/17 0501) Pulse Rate: 57 (06/17 0501)  Labs: Recent Labs    04/28/23 0459 04/29/23 0430 04/30/23 0358  HGB 10.4* 10.8* 10.7*  HCT 31.4* 32.0* 32.7*  PLT 143* 157 177  CREATININE 1.49* 1.57* 1.36*    Estimated Creatinine Clearance: 43.3 mL/min (A) (by C-G formula based on SCr of 1.36 mg/dL (H)).   Medical History: Past Medical History:  Diagnosis Date   AAA (abdominal aortic aneurysm) (HCC)    Arthritis    knees   Atrial fibrillation (HCC)    CAD (coronary artery disease)    Cancer (HCC)    prostate   CKD (chronic kidney disease), stage III (HCC) 06/18/2022   COVID    06/2019 and in 2022 states they were mild cases   Diabetes mellitus without complication (HCC)    Dyspnea    once in a while   Dysrhythmia    A-fib pt is on Eliquis   GERD (gastroesophageal reflux disease)    Gout    History of kidney stones    Hyperlipidemia    Hypertension    Hypothyroidism    Myocardial infarction (HCC) 01/1991   sp inferior   Nerve compression    right leg   Reflux    Seasonal allergies    Thoracic ascending aortic aneurysm (HCC)    Thyroid disease    hypothyroidism    Medications:  PTA Eliquis 5mg  bid (last dose 6/11 at 1000)  Assessment: 82 y.o. male with medical history significant for hypertension, type 2 diabetes mellitus, CKD 3B, CAD, PAF on Eliquis, AAA, and recent  admission for obstructing right ureteral stone complicated by sepsis secondary to pyelonephritis requiring cystoscopy with right ureteral stent placement on 04/25/2023 and discharged home on oral Bactrim presented with bloody urine leaking around the catheter and suprapubic pain with abdominal distention and diarrhea.  On presentation, creatinine was 1.67.    Urology was consulted and he was started on continuous bladder irrigation. CBI was clamped 6/16 and is clear per Urology note on 6/17.  Plan to go to OR on 6/20 for Cystoscopy/ Ureteroscopy/ Holmium Laser/ Stone Extraction/ Stent Exchange.    Goal of Therapy:  Monitor platelets by anticoagulation protocol: Yes  Today, 04/30/23  - Hgb = 10.7, low but stable - Plt =177 WNL   Plan:  - Lovenox 1mg /kg (80mg ) SQ every 12 hours. - Spoke with Garrett Kirschner NP on 6/17, who states typically don't need to hold anticoagulation for this urology procedure because non-invasive.   However, will stop Lovenox 6/20 after 0000 dose and can likely resume po anticoagulation after surgery.   - Stop date placed on lovenox. - Pharmacy will sign off lovenox consult. - Urology to re-consult when PO anticoagulation can be resumed or otherwise if needed. Garrett Lang [place] PA aware)      Garrett Jordan Garrett Jordan 04/30/2023,11:07 AM

## 2023-04-30 NOTE — Progress Notes (Signed)
PROGRESS NOTE    Garrett Jordan  ZOX:[id number] DOB: 02/20/1941 DOA: 04/26/2023 PCP: [name], MD   Brief Narrative:  82 y.o. male with medical history significant for hypertension, type 2 diabetes mellitus, CKD 3B, CAD, PAF on Eliquis, AAA, and recent admission for obstructing right ureteral stone complicated by sepsis secondary to pyelonephritis requiring cystoscopy with right ureteral stent placement on 04/25/2023 and discharged home on oral Bactrim presented with bloody urine leaking around the catheter and suprapubic pain with abdominal distention and diarrhea.  On presentation, creatinine was 1.67.  Urology was consulted and he was started on continuous bladder irrigation.  Assessment & Plan:   Gross hematuria UTI: Present on admission History of recent pyelonephritis secondary to right ureteral stone requiring cystoscopy and right ureteral stent placement -Urology was consulted and he was started on continuous bladder irrigation.  Follow urology recommendations. -Continue Rocephin.  Follow urine cultures: Negative so far -Plavix and Eliquis on hold. -Monitor hemoglobin.  10.7 today.  Paroxysmal A-fib -Mild intermittent bradycardia present.  Eliquis on hold.  Continue Coreg.  -Consult cardiology regarding anticoagulation management  CAD Hypertension hyperlipidemia -Continue statin, amlodipine, Coreg, irbesartan, isosorbide mononitrate.  -Consult cardiology  CKD stage IIIb -Creatinine currently stable  Diabetes mellitus type 2 with hyperglycemia -A1c 6.1 earlier this month.  Continue CBGs with SSI.  Hypothyroidism -Continue levothyroxine  Thrombocytopenia -Resolved  Leukopenia -Resolved  Hyponatremia -Mild. Monitor  Hypokalemia -replace. Repeat am labs  Acute metabolic acidosis -Resolved  Diarrhea--questionable cause.  Stool for C. difficile negative.  Use Imodium as needed    DVT prophylaxis: SCDs Code Status: Full Family Communication: None at  bedside Disposition Plan: Status is: inpatient because: Of need for bladder irrigation.    Consultants: Urology  Procedures: None  Antimicrobials: Rocephin from 04/27/2023 onwards   Subjective: Patient seen and examined at bedside.  Still having intermittent headaches but better. No fever, chest pain or vomiting reported.  Objective: Vitals:   04/29/23 0857 04/29/23 1420 04/30/23 0501 04/30/23 0609  BP: (!) 147/83 127/76 130/78   Pulse:  60 (!) 57   Resp:  14 18   Temp:  97.9 F (36.6 C) 98 F (36.7 C)   TempSrc:  Oral Oral   SpO2:  99% 98%   Weight:    81.7 kg  Height:        Intake/Output Summary (Last 24 hours) at 04/30/2023 1121 Last data filed at 04/30/2023 0933 Gross per 24 hour  Intake 200 ml  Output 2150 ml  Net -1950 ml    Filed Weights   04/28/23 0500 04/29/23 0557 04/30/23 0609  Weight: 86 kg 86 kg 81.7 kg    Examination:  General: No acute distress. On room air. Chronically ill and deconditioned looking. ENT/neck: No palpable neck masses or JVD elevation noted. respiratory: Bilateral decreased breath sounds at bases with some crackles CVS: mildly bradycardic intermittently; S1-S2 heard abdominal: Soft, nontender,distended mildly; no organomegaly, bowel sounds are heard normally Extremities: Trace lower extremity edema bilaterally; no clubbing  CNS: Alert and awake. No focal deficits Lymph: No obvious cervical lymphadenopathy Skin: No obvious rashes/petechiae psych: Mood and judgement are normal musculoskeletal: No obvious joint erythema/tenderness Genitourinary: has foley catheter     Data Reviewed: I have personally reviewed following labs and imaging studies  CBC: Recent Labs  Lab 04/24/23 1613 04/24/23 1620 04/27/23 0028 04/27/23 0630 04/28/23 0459 04/29/23 0430 04/30/23 0358  WBC 3.5*   < > 4.1 3.5* 3.7* 4.0 4.7  NEUTROABS 3.2  --  3.3  --   --   --  2.5  HGB 15.0   < > 12.1* 11.8* 10.4* 10.8* 10.7*  HCT 44.8   < > 34.8* 36.3*  31.4* 32.0* 32.7*  MCV 88.0   < > 85.9 89.2 88.7 88.2 88.6  PLT 146*   < > 155 140* 143* 157 177   < > = values in this interval not displayed.    Basic Metabolic Panel: Recent Labs  Lab 04/27/23 0028 04/27/23 0630 04/28/23 0459 04/29/23 0430 04/30/23 0358  NA 134* 135 136 136 134*  K 4.0 4.1 3.5 3.9 3.4*  CL 102 102 105 104 104  CO2 19* 22 24 25 23   GLUCOSE 130* 113* 132* 138* 144*  BUN 31* 27* 22 21 22   CREATININE 1.67* 1.49* 1.49* 1.57* 1.36*  CALCIUM 9.7 9.3 8.9 9.3 9.4  MG  --   --   --  1.9 1.8    GFR: Estimated Creatinine Clearance: 43.3 mL/min (A) (by C-G formula based on SCr of 1.36 mg/dL (H)). Liver Function Tests: Recent Labs  Lab 04/24/23 1613  AST 20  ALT 16  ALKPHOS 79  BILITOT 2.1*  PROT 7.0  ALBUMIN 3.8    No results for input(s): "LIPASE", "AMYLASE" in the last 168 hours. No results for input(s): "AMMONIA" in the last 168 hours. Coagulation Profile: Recent Labs  Lab 04/24/23 1613  INR 2.0*    Cardiac Enzymes: No results for input(s): "CKTOTAL", "CKMB", "CKMBINDEX", "TROPONINI" in the last 168 hours. BNP (last 3 results) No results for input(s): "PROBNP" in the last 8760 hours. HbA1C: No results for input(s): "HGBA1C" in the last 72 hours. CBG: Recent Labs  Lab 04/29/23 0748 04/29/23 1200 04/29/23 1624 04/29/23 2041 04/30/23 0728  GLUCAP 102* 228* 100* 299* 123*    Lipid Profile: No results for input(s): "CHOL", "HDL", "LDLCALC", "TRIG", "CHOLHDL", "LDLDIRECT" in the last 72 hours. Thyroid Function Tests: No results for input(s): "TSH", "T4TOTAL", "FREET4", "T3FREE", "THYROIDAB" in the last 72 hours. Anemia Panel: No results for input(s): "VITAMINB12", "FOLATE", "FERRITIN", "TIBC", "IRON", "RETICCTPCT" in the last 72 hours. Sepsis Labs: Recent Labs  Lab 04/24/23 1613 04/24/23 2049  LATICACIDVEN 3.4* 2.2*     Recent Results (from the past 240 hour(s))  Culture, blood (Routine x 2)     Status: None   Collection Time:  04/24/23  4:13 PM   Specimen: BLOOD  Result Value Ref Range Status   Specimen Description BLOOD RIGHT ANTECUBITAL  Final   Special Requests   Final    BOTTLES DRAWN AEROBIC AND ANAEROBIC Blood Culture results may not be optimal due to an excessive volume of blood received in culture bottles   Culture   Final    NO GROWTH 5 DAYS Performed at [facility], [address], [place], [place] [address]    Report Status 04/29/2023 FINAL  Final  Resp panel by RT-PCR (RSV, Flu A&B, Covid) Anterior Nasal Swab     Status: None   Collection Time: 04/24/23  4:56 PM   Specimen: Anterior Nasal Swab  Result Value Ref Range Status   SARS Coronavirus 2 by RT PCR NEGATIVE NEGATIVE Final   Influenza A by PCR NEGATIVE NEGATIVE Final   Influenza B by PCR NEGATIVE NEGATIVE Final    Comment: (NOTE) The Xpert Xpress SARS-CoV-2/FLU/RSV plus assay is intended as an aid in the diagnosis of influenza from Nasopharyngeal swab specimens and should not be used as a sole basis for treatment. Nasal washings and aspirates are unacceptable for Xpert Xpress SARS-CoV-2/FLU/RSV testing.  Fact  Sheet for Patients: BloggerCourse.com  Fact Sheet for Healthcare Providers: SeriousBroker.it  This test is not yet approved or cleared by the [place] FDA and has been authorized for detection and/or diagnosis of SARS-CoV-2 by FDA under an Emergency Use Authorization (EUA). This EUA will remain in effect (meaning this test can be used) for the duration of the COVID-19 declaration under Section 564(b)(1) of the Act, 21 U.S.C. section 360bbb-3(b)(1), unless the authorization is terminated or revoked.     Resp Syncytial Virus by PCR NEGATIVE NEGATIVE Final    Comment: (NOTE) Fact Sheet for Patients: BloggerCourse.com  Fact Sheet for Healthcare Providers: SeriousBroker.it  This test is not yet approved or cleared by  the [place] FDA and has been authorized for detection and/or diagnosis of SARS-CoV-2 by FDA under an Emergency Use Authorization (EUA). This EUA will remain in effect (meaning this test can be used) for the duration of the COVID-19 declaration under Section 564(b)(1) of the Act, 21 U.S.C. section 360bbb-3(b)(1), unless the authorization is terminated or revoked.  Performed at [facility], [address] [place], [place] [address]   Urine Culture     Status: None   Collection Time: 04/24/23  8:41 PM   Specimen: Urine, Random  Result Value Ref Range Status   Specimen Description URINE, RANDOM  Final   Special Requests NONE Reflexed from [id number]  Final   Culture   Final    NO GROWTH Performed at [facility], [address], [place], [place] [address]    Report Status 04/25/2023 FINAL  Final  Urine Culture (for pregnant, neutropenic or urologic patients or patients with an indwelling urinary catheter)     Status: None   Collection Time: 04/24/23  8:42 PM   Specimen: Urine, Clean Catch  Result Value Ref Range Status   Specimen Description URINE, CLEAN CATCH  Final   Special Requests NONE  Final   Culture   Final    NO GROWTH Performed at [facility], [address] [place], [place] [address]    Report Status 04/25/2023 FINAL  Final  Blood Culture (routine x 2)     Status: None   Collection Time: 04/25/23  3:06 AM   Specimen: BLOOD  Result Value Ref Range Status   Specimen Description BLOOD BLOOD RIGHT ARM  Final   Special Requests   Final    BOTTLES DRAWN AEROBIC AND ANAEROBIC Blood Culture adequate volume   Culture   Final    NO GROWTH 5 DAYS Performed at [facility], [address], [place], [place] [address]    Report Status 04/30/2023 FINAL  Final  Urine Culture (for pregnant, neutropenic or urologic patients or patients with an indwelling urinary catheter)     Status: None   Collection Time: 04/27/23  9:07 AM   Specimen: Urine,  Catheterized  Result Value Ref Range Status   Specimen Description   Final    URINE, CATHETERIZED Performed at [facility], [address], [place], [place] [address]    Special Requests   Final    NONE Performed at [facility], [address], [place], [place] [address]    Culture   Final    NO GROWTH Performed at [facility], [address], [place], [place] [address]    Report Status 04/28/2023 FINAL  Final  C Difficile Quick Screen w PCR reflex     Status: None   Collection Time:  04/27/23  9:55 AM   Specimen: STOOL  Result Value Ref Range Status   C Diff antigen NEGATIVE NEGATIVE Final   C Diff toxin NEGATIVE NEGATIVE Final   C Diff interpretation No C. difficile detected.  Final    Comment: Performed at [facility], [address] [place], [place] [address]         Radiology Studies: No results found.      Scheduled Meds:  amLODipine  10 mg Oral Daily   atorvastatin  40 mg Oral Daily   carvedilol  6.25 mg Oral BID   Chlorhexidine Gluconate Cloth  6 each Topical Daily   clonazePAM  0.5 mg Oral QHS   enoxaparin (LOVENOX) injection  80 mg Subcutaneous Q12H   insulin aspart  0-5 Units Subcutaneous QHS   insulin aspart  0-6 Units Subcutaneous TID WC   irbesartan  300 mg Oral Daily   isosorbide mononitrate  30 mg Oral Daily   levothyroxine  175 mcg Oral Q0600   pantoprazole  40 mg Oral QHS   sodium chloride flush  3 mL Intravenous Q12H   Continuous Infusions:  cefTRIAXone (ROCEPHIN)  IV 1 g (04/30/23 0546)   sodium chloride irrigation            Glade Lloyd, MD [facility] 04/30/2023, 11:21 AM

## 2023-05-01 DIAGNOSIS — N132 Hydronephrosis with renal and ureteral calculous obstruction: Secondary | ICD-10-CM | POA: Diagnosis not present

## 2023-05-01 DIAGNOSIS — R310 Gross hematuria: Secondary | ICD-10-CM | POA: Diagnosis not present

## 2023-05-01 LAB — CBC WITH DIFFERENTIAL/PLATELET
Abs Immature Granulocytes: 0.21 10*3/uL — ABNORMAL HIGH (ref 0.00–0.07)
Basophils Absolute: 0.1 10*3/uL (ref 0.0–0.1)
Basophils Relative: 1 %
Eosinophils Absolute: 0.3 10*3/uL (ref 0.0–0.5)
Eosinophils Relative: 5 %
HCT: 34.1 % — ABNORMAL LOW (ref 39.0–52.0)
Hemoglobin: 11.4 g/dL — ABNORMAL LOW (ref 13.0–17.0)
Immature Granulocytes: 4 %
Lymphocytes Relative: 25 %
Lymphs Abs: 1.5 10*3/uL (ref 0.7–4.0)
MCH: 29.5 pg (ref 26.0–34.0)
MCHC: 33.4 g/dL (ref 30.0–36.0)
MCV: 88.1 fL (ref 80.0–100.0)
Monocytes Absolute: 0.7 10*3/uL (ref 0.1–1.0)
Monocytes Relative: 12 %
Neutro Abs: 3.2 10*3/uL (ref 1.7–7.7)
Neutrophils Relative %: 53 %
Platelets: 205 10*3/uL (ref 150–400)
RBC: 3.87 MIL/uL — ABNORMAL LOW (ref 4.22–5.81)
RDW: 13.2 % (ref 11.5–15.5)
WBC: 5.9 10*3/uL (ref 4.0–10.5)
nRBC: 0 % (ref 0.0–0.2)

## 2023-05-01 LAB — GLUCOSE, CAPILLARY
Glucose-Capillary: 127 mg/dL — ABNORMAL HIGH (ref 70–99)
Glucose-Capillary: 163 mg/dL — ABNORMAL HIGH (ref 70–99)
Glucose-Capillary: 189 mg/dL — ABNORMAL HIGH (ref 70–99)
Glucose-Capillary: 147 mg/dL — ABNORMAL HIGH (ref 70–99)

## 2023-05-01 LAB — BASIC METABOLIC PANEL
Anion gap: 7 (ref 5–15)
BUN: 20 mg/dL (ref 8–23)
CO2: 24 mmol/L (ref 22–32)
Calcium: 9.5 mg/dL (ref 8.9–10.3)
Chloride: 105 mmol/L (ref 98–111)
Creatinine, Ser: 1.33 mg/dL — ABNORMAL HIGH (ref 0.61–1.24)
GFR, Estimated: 54 mL/min — ABNORMAL LOW (ref >60)
Glucose, Bld: 128 mg/dL — ABNORMAL HIGH (ref 70–99)
Potassium: 3.9 mmol/L (ref 3.5–5.1)
Sodium: 136 mmol/L (ref 135–145)

## 2023-05-01 LAB — MAGNESIUM: Magnesium: 1.8 mg/dL (ref 1.7–2.4)

## 2023-05-01 NOTE — Progress Notes (Signed)
Mobility Specialist - Progress Note   05/01/23 1055  Mobility  Activity Ambulated with assistance in hallway  Level of Assistance Modified independent, requires aide device or extra time  Assistive Device Other (Comment) (IV Pole)  Distance Ambulated (ft) 700 ft  Activity Response Tolerated well  Mobility Referral Yes  $Mobility charge 1 Mobility  Mobility Specialist Start Time (ACUTE ONLY) 1040  Mobility Specialist Stop Time (ACUTE ONLY) 1053  Mobility Specialist Time Calculation (min) (ACUTE ONLY) 13 min   Pt received in recliner and agreeable to mobility. No complaints during session. Pt to recliner after session with all needs met.    [facility]

## 2023-05-01 NOTE — Progress Notes (Signed)
PROGRESS NOTE    Garrett Jordan  ZOX:[id number] DOB: 1941/05/22 DOA: 04/26/2023 PCP: [name], MD   Brief Narrative:  82 y.o. male with medical history significant for hypertension, type 2 diabetes mellitus, CKD 3B, CAD, PAF on Eliquis, AAA, and recent admission for obstructing right ureteral stone complicated by sepsis secondary to pyelonephritis requiring cystoscopy with right ureteral stent placement on 04/25/2023 and discharged home on oral Bactrim presented with bloody urine leaking around the catheter and suprapubic pain with abdominal distention and diarrhea.  On presentation, creatinine was 1.67.  Urology was consulted and he was started on continuous bladder irrigation.  Urology planning on urological procedure on Thursday.  Subsequently cardiology was also consulted.  Assessment & Plan:   Gross hematuria UTI: Present on admission History of recent pyelonephritis secondary to right ureteral stone requiring cystoscopy and right ureteral stent placement -Urology was consulted and he was started on continuous bladder irrigation. Urology planning on urological procedure on Thursday. -Continue Rocephin.  Urine culture negative so far -Plavix and Eliquis on hold. -Monitor hemoglobin.  11.4 today.  Paroxysmal A-fib -Mostly rate controlled.  Eliquis on hold.  Continue Coreg.  -Cardiology consulted on 04/30/2023: Patient was started on subcutaneous therapeutic Lovenox by cardiology on 04/30/2023  CAD Hypertension hyperlipidemia -Continue statin, amlodipine, Coreg, irbesartan, isosorbide mononitrate.  -Cardiology following.  CKD stage IIIb -Creatinine currently stable  Diabetes mellitus type 2 with hyperglycemia -A1c 6.1 earlier this month.  Continue CBGs with SSI.  Hypothyroidism -Continue levothyroxine  Thrombocytopenia -Resolved  Leukopenia -Resolved  Hyponatremia -Improved  Hypokalemia -Improved  Acute metabolic acidosis -Resolved  Diarrhea--questionable  cause.  Stool for C. difficile negative.  Use Imodium as needed    DVT prophylaxis: SCDs Code Status: Full Family Communication: None at bedside Disposition Plan: Status is: inpatient because: Of need for bladder irrigation.    Consultants: Urology/cardiology  Procedures: None  Antimicrobials: Rocephin from 04/27/2023 onwards   Subjective: Patient seen and examined at bedside.  No shortness of breath, fever, vomiting reported.  Denies worsening abdominal pain.   Objective: Vitals:   04/30/23 1258 04/30/23 2103 05/01/23 0424 05/01/23 0455  BP: 120/76 (!) 131/95 129/89   Pulse: 63 78 66   Resp: 16 18 19    Temp: 98.3 F (36.8 C)  97.8 F (36.6 C)   TempSrc: Oral  Oral   SpO2: 99% 98% 98%   Weight:    81.8 kg  Height:        Intake/Output Summary (Last 24 hours) at 05/01/2023 0816 Last data filed at 05/01/2023 0601 Gross per 24 hour  Intake 120 ml  Output 2605 ml  Net -2485 ml    Filed Weights   04/29/23 0557 04/30/23 0609 05/01/23 0455  Weight: 86 kg 81.7 kg 81.8 kg    Examination:  General: Currently on room air.  No distress.  Chronically ill and deconditioned looking. ENT/neck: No obvious JVD elevation or palpable thyromegaly noted  respiratory: Decreased breath sounds at bases bilaterally with scattered crackles CVS: S1-S2 heard; rate mostly controlled currently  abdominal: Soft, nontender, distended slightly; no organomegaly, normal bowel sounds heard  extremities: No cyanosis; mild lower extremity edema present  CNS: Awake and oriented.  No focal neurologic deficits noted  lymph: No obvious lymphadenopathy is palpable Skin: No obvious ecchymosis/lesions  psych: Affect and mood are normal  musculoskeletal: No obvious joint swelling/deformity  genitourinary: Foley catheter present    Data Reviewed: I have personally reviewed following labs and imaging studies  CBC: Recent Labs  Lab  04/24/23 1613 04/24/23 1620 04/27/23 0028 04/27/23 0630  04/28/23 0459 04/29/23 0430 04/30/23 0358 05/01/23 0410  WBC 3.5*   < > 4.1 3.5* 3.7* 4.0 4.7 5.9  NEUTROABS 3.2  --  3.3  --   --   --  2.5 3.2  HGB 15.0   < > 12.1* 11.8* 10.4* 10.8* 10.7* 11.4*  HCT 44.8   < > 34.8* 36.3* 31.4* 32.0* 32.7* 34.1*  MCV 88.0   < > 85.9 89.2 88.7 88.2 88.6 88.1  PLT 146*   < > 155 140* 143* 157 177 205   < > = values in this interval not displayed.    Basic Metabolic Panel: Recent Labs  Lab 04/27/23 0630 04/28/23 0459 04/29/23 0430 04/30/23 0358 05/01/23 0410  NA 135 136 136 134* 136  K 4.1 3.5 3.9 3.4* 3.9  CL 102 105 104 104 105  CO2 22 24 25 23 24   GLUCOSE 113* 132* 138* 144* 128*  BUN 27* 22 21 22 20   CREATININE 1.49* 1.49* 1.57* 1.36* 1.33*  CALCIUM 9.3 8.9 9.3 9.4 9.5  MG  --   --  1.9 1.8 1.8    GFR: Estimated Creatinine Clearance: 44.3 mL/min (A) (by C-G formula based on SCr of 1.33 mg/dL (H)). Liver Function Tests: Recent Labs  Lab 04/24/23 1613  AST 20  ALT 16  ALKPHOS 79  BILITOT 2.1*  PROT 7.0  ALBUMIN 3.8    No results for input(s): "LIPASE", "AMYLASE" in the last 168 hours. No results for input(s): "AMMONIA" in the last 168 hours. Coagulation Profile: Recent Labs  Lab 04/24/23 1613  INR 2.0*    Cardiac Enzymes: No results for input(s): "CKTOTAL", "CKMB", "CKMBINDEX", "TROPONINI" in the last 168 hours. BNP (last 3 results) No results for input(s): "PROBNP" in the last 8760 hours. HbA1C: No results for input(s): "HGBA1C" in the last 72 hours. CBG: Recent Labs  Lab 04/30/23 0728 04/30/23 1121 04/30/23 1624 04/30/23 2101 05/01/23 0734  GLUCAP 123* 200* 166* 113* 127*    Lipid Profile: No results for input(s): "CHOL", "HDL", "LDLCALC", "TRIG", "CHOLHDL", "LDLDIRECT" in the last 72 hours. Thyroid Function Tests: No results for input(s): "TSH", "T4TOTAL", "FREET4", "T3FREE", "THYROIDAB" in the last 72 hours. Anemia Panel: No results for input(s): "VITAMINB12", "FOLATE", "FERRITIN", "TIBC", "IRON",  "RETICCTPCT" in the last 72 hours. Sepsis Labs: Recent Labs  Lab 04/24/23 1613 04/24/23 2049  LATICACIDVEN 3.4* 2.2*     Recent Results (from the past 240 hour(s))  Culture, blood (Routine x 2)     Status: None   Collection Time: 04/24/23  4:13 PM   Specimen: BLOOD  Result Value Ref Range Status   Specimen Description BLOOD RIGHT ANTECUBITAL  Final   Special Requests   Final    BOTTLES DRAWN AEROBIC AND ANAEROBIC Blood Culture results may not be optimal due to an excessive volume of blood received in culture bottles   Culture   Final    NO GROWTH 5 DAYS Performed at [facility], [address], [place], [place] [address]    Report Status 04/29/2023 FINAL  Final  Resp panel by RT-PCR (RSV, Flu A&B, Covid) Anterior Nasal Swab     Status: None   Collection Time: 04/24/23  4:56 PM   Specimen: Anterior Nasal Swab  Result Value Ref Range Status   SARS Coronavirus 2 by RT PCR NEGATIVE NEGATIVE Final   Influenza A by PCR NEGATIVE NEGATIVE Final   Influenza B by PCR NEGATIVE NEGATIVE Final  Comment: (NOTE) The Xpert Xpress SARS-CoV-2/FLU/RSV plus assay is intended as an aid in the diagnosis of influenza from Nasopharyngeal swab specimens and should not be used as a sole basis for treatment. Nasal washings and aspirates are unacceptable for Xpert Xpress SARS-CoV-2/FLU/RSV testing.  Fact Sheet for Patients: BloggerCourse.com  Fact Sheet for Healthcare Providers: SeriousBroker.it  This test is not yet approved or cleared by the [place] FDA and has been authorized for detection and/or diagnosis of SARS-CoV-2 by FDA under an Emergency Use Authorization (EUA). This EUA will remain in effect (meaning this test can be used) for the duration of the COVID-19 declaration under Section 564(b)(1) of the Act, 21 U.S.C. section 360bbb-3(b)(1), unless the authorization is terminated or revoked.     Resp Syncytial Virus by PCR  NEGATIVE NEGATIVE Final    Comment: (NOTE) Fact Sheet for Patients: BloggerCourse.com  Fact Sheet for Healthcare Providers: SeriousBroker.it  This test is not yet approved or cleared by the [place] FDA and has been authorized for detection and/or diagnosis of SARS-CoV-2 by FDA under an Emergency Use Authorization (EUA). This EUA will remain in effect (meaning this test can be used) for the duration of the COVID-19 declaration under Section 564(b)(1) of the Act, 21 U.S.C. section 360bbb-3(b)(1), unless the authorization is terminated or revoked.  Performed at [facility], [address], [place], [place] [address]   Urine Culture     Status: None   Collection Time: 04/24/23  8:41 PM   Specimen: Urine, Random  Result Value Ref Range Status   Specimen Description URINE, RANDOM  Final   Special Requests NONE Reflexed from [id number]  Final   Culture   Final    NO GROWTH Performed at [facility], [address] [place], [place] [address]    Report Status 04/25/2023 FINAL  Final  Urine Culture (for pregnant, neutropenic or urologic patients or patients with an indwelling urinary catheter)     Status: None   Collection Time: 04/24/23  8:42 PM   Specimen: Urine, Clean Catch  Result Value Ref Range Status   Specimen Description URINE, CLEAN CATCH  Final   Special Requests NONE  Final   Culture   Final    NO GROWTH Performed at [facility], [address], [place], [place] [address]    Report Status 04/25/2023 FINAL  Final  Blood Culture (routine x 2)     Status: None   Collection Time: 04/25/23  3:06 AM   Specimen: BLOOD  Result Value Ref Range Status   Specimen Description BLOOD BLOOD RIGHT ARM  Final   Special Requests   Final    BOTTLES DRAWN AEROBIC AND ANAEROBIC Blood Culture adequate volume   Culture   Final    NO GROWTH 5 DAYS Performed at [facility], [address] [place], [place]  [address]    Report Status 04/30/2023 FINAL  Final  Urine Culture (for pregnant, neutropenic or urologic patients or patients with an indwelling urinary catheter)     Status: None   Collection Time: 04/27/23  9:07 AM   Specimen: Urine, Catheterized  Result Value Ref Range Status   Specimen Description   Final    URINE, CATHETERIZED Performed at [facility], [address], [place], [place] [address]    Special Requests   Final    NONE Performed at [facility], [address], [place], [place] [address]    Culture  Final    NO GROWTH Performed at [facility], [address] [place], [place] [address]    Report Status 04/28/2023 FINAL  Final  C Difficile Quick Screen w PCR reflex     Status: None   Collection Time: 04/27/23  9:55 AM   Specimen: STOOL  Result Value Ref Range Status   C Diff antigen NEGATIVE NEGATIVE Final   C Diff toxin NEGATIVE NEGATIVE Final   C Diff interpretation No C. difficile detected.  Final    Comment: Performed at [facility], [address] [place], [place] [address]         Radiology Studies: No results found.      Scheduled Meds:  amLODipine  10 mg Oral Daily   atorvastatin  40 mg Oral Daily   carvedilol  6.25 mg Oral BID   Chlorhexidine Gluconate Cloth  6 each Topical Daily   clonazePAM  0.5 mg Oral QHS   enoxaparin (LOVENOX) injection  80 mg Subcutaneous Q12H   insulin aspart  0-5 Units Subcutaneous QHS   insulin aspart  0-6 Units Subcutaneous TID WC   irbesartan  300 mg Oral Daily   isosorbide mononitrate  30 mg Oral Daily   levothyroxine  175 mcg Oral Q0600   pantoprazole  40 mg Oral QHS   sodium chloride flush  3 mL Intravenous Q12H   Continuous Infusions:  cefTRIAXone (ROCEPHIN)  IV 1 g (05/01/23 0559)   sodium chloride irrigation            Glade Lloyd, MD [facility] 05/01/2023, 8:16 AM

## 2023-05-01 NOTE — Progress Notes (Signed)
Transition of Care [facility]) - Inpatient Brief Assessment   Patient Details  Name: Garrett BUCKALEW Sr. MRN: [id number] Date of Birth: Apr 11, 1941  Transition of Care [facility]) CM/SW Contact:    Larrie Kass, LCSW Phone Number: 05/01/2023, 10:31 AM   Clinical Narrative:  Transition of Care Department [facility]) has reviewed patient and no TOC needs have been identified at this time. We will continue to monitor patient advancement through interdisciplinary progression rounds. If new patient transition needs arise, please place a TOC consult.  Transition of Care Asessment: Insurance and Status: Insurance coverage has been reviewed Patient has primary care physician: Yes Home environment has been reviewed: yes Prior level of function:: independent Prior/Current Home Services: No current home services Social Determinants of Health Reivew: SDOH reviewed no interventions necessary Readmission risk has been reviewed: Yes Transition of care needs: no transition of care needs at this time

## 2023-05-02 ENCOUNTER — Encounter (HOSPITAL_COMMUNITY): Payer: Self-pay | Admitting: Urology

## 2023-05-02 DIAGNOSIS — Z951 Presence of aortocoronary bypass graft: Secondary | ICD-10-CM

## 2023-05-02 DIAGNOSIS — N132 Hydronephrosis with renal and ureteral calculous obstruction: Secondary | ICD-10-CM | POA: Diagnosis not present

## 2023-05-02 DIAGNOSIS — R310 Gross hematuria: Secondary | ICD-10-CM | POA: Diagnosis not present

## 2023-05-02 DIAGNOSIS — I2510 Atherosclerotic heart disease of native coronary artery without angina pectoris: Secondary | ICD-10-CM | POA: Diagnosis not present

## 2023-05-02 DIAGNOSIS — Z5181 Encounter for therapeutic drug level monitoring: Secondary | ICD-10-CM | POA: Diagnosis not present

## 2023-05-02 DIAGNOSIS — I480 Paroxysmal atrial fibrillation: Secondary | ICD-10-CM | POA: Diagnosis not present

## 2023-05-02 LAB — GLUCOSE, CAPILLARY
Glucose-Capillary: 150 mg/dL — ABNORMAL HIGH (ref 70–99)
Glucose-Capillary: 128 mg/dL — ABNORMAL HIGH (ref 70–99)
Glucose-Capillary: 179 mg/dL — ABNORMAL HIGH (ref 70–99)
Glucose-Capillary: 123 mg/dL — ABNORMAL HIGH (ref 70–99)

## 2023-05-02 LAB — BASIC METABOLIC PANEL
Anion gap: 6 (ref 5–15)
BUN: 26 mg/dL — ABNORMAL HIGH (ref 8–23)
CO2: 24 mmol/L (ref 22–32)
Calcium: 9.6 mg/dL (ref 8.9–10.3)
Chloride: 106 mmol/L (ref 98–111)
Creatinine, Ser: 1.32 mg/dL — ABNORMAL HIGH (ref 0.61–1.24)
GFR, Estimated: 55 mL/min — ABNORMAL LOW (ref >60)
Glucose, Bld: 131 mg/dL — ABNORMAL HIGH (ref 70–99)
Potassium: 4.1 mmol/L (ref 3.5–5.1)
Sodium: 136 mmol/L (ref 135–145)

## 2023-05-02 LAB — CBC WITH DIFFERENTIAL/PLATELET
Abs Immature Granulocytes: 0.28 10*3/uL — ABNORMAL HIGH (ref 0.00–0.07)
Basophils Absolute: 0.1 10*3/uL (ref 0.0–0.1)
Basophils Relative: 1 %
Eosinophils Absolute: 0.2 10*3/uL (ref 0.0–0.5)
Eosinophils Relative: 4 %
HCT: 34.2 % — ABNORMAL LOW (ref 39.0–52.0)
Hemoglobin: 11.3 g/dL — ABNORMAL LOW (ref 13.0–17.0)
Immature Granulocytes: 5 %
Lymphocytes Relative: 21 %
Lymphs Abs: 1.3 10*3/uL (ref 0.7–4.0)
MCH: 29 pg (ref 26.0–34.0)
MCHC: 33 g/dL (ref 30.0–36.0)
MCV: 87.9 fL (ref 80.0–100.0)
Monocytes Absolute: 0.6 10*3/uL (ref 0.1–1.0)
Monocytes Relative: 10 %
Neutro Abs: 3.7 10*3/uL (ref 1.7–7.7)
Neutrophils Relative %: 59 %
Platelets: 240 10*3/uL (ref 150–400)
RBC: 3.89 MIL/uL — ABNORMAL LOW (ref 4.22–5.81)
RDW: 13.2 % (ref 11.5–15.5)
WBC: 6.1 10*3/uL (ref 4.0–10.5)
nRBC: 0 % (ref 0.0–0.2)

## 2023-05-02 LAB — MAGNESIUM: Magnesium: 1.9 mg/dL (ref 1.7–2.4)

## 2023-05-02 MED ORDER — OXYCODONE HCL 5 MG PO TABS
5.0000 mg | ORAL_TABLET | Freq: Four times a day (QID) | ORAL | Status: DC | PRN
  Administered 2023-05-03 (×2): 5 mg via ORAL
  Filled 2023-05-02 (×3): qty 1

## 2023-05-02 NOTE — Plan of Care (Signed)
  Problem: Clinical Measurements: Goal: Diagnostic test results will improve Outcome: Progressing Goal: Respiratory complications will improve Outcome: Progressing Goal: Cardiovascular complication will be avoided Outcome: Progressing   Problem: Activity: Goal: Risk for activity intolerance will decrease Outcome: Progressing   

## 2023-05-02 NOTE — Anesthesia Preprocedure Evaluation (Addendum)
Anesthesia Evaluation  Patient identified by MRN, date of birth, ID band Patient awake    Reviewed: Allergy & Precautions, H&P , NPO status , Patient's Chart, lab work & pertinent test results, reviewed documented beta blocker date and time   Airway Mallampati: II  TM Distance: >3 FB Neck ROM: Full    Dental no notable dental hx. (+) Teeth Intact, Dental Advisory Given   Pulmonary former smoker   Pulmonary exam normal breath sounds clear to auscultation       Cardiovascular hypertension, Pt. on medications and Pt. 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 Left ventricular ejection fraction, by estimation, is 55 to 60%. The  left ventricle has normal function. The left ventricle demonstrates global  hypokinesis. There is mild concentric left ventricular hypertrophy. Left  ventricular diastolic parameters  are indeterminate.   2. Right ventricular systolic function is mildly reduced. The right  ventricular size is normal.   3. Left atrial size was moderately dilated.   4. Right atrial size was moderately dilated.   5. The mitral valve is normal in structure. No evidence of mitral valve  regurgitation. No evidence of mitral stenosis.   6. Tricuspid valve regurgitation is moderate.   7. The aortic valve is normal in structure. Aortic valve regurgitation is  not visualized. No aortic stenosis is present.   8. Aneurysm of the ascending aorta, measuring 44 mm. There is mild  dilatation of the aortic root, measuring 39 mm.   9. The inferior vena cava is normal in size with greater than 50%  respiratory variability, suggesting right atrial pressure of 3 mmHg.   Cardiac Cath 06/15/22 1.  Severe multivessel coronary artery disease with  total occlusion of the native LAD, total occlusion of the native circumflex beyond the first OM branch, and subtotal occlusion of the mid RCA 2.  Status post CABG with continued patency of the LIMA to LAD, saphenous vein graft to acute marginal, and saphenous vein graft to PDA with extensive diffuse degenerative changes. 3.  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  Acute occlusion of the saphenous vein graft to left posterolateral branch with extensive degenerative changes and unsuccessful PCI due to inability to restore TIMI-3 flow in an extensively degenerated graft 5.  Normal LVEDP        Neuro/Psych negative neurological ROS  negative psych ROS   GI/Hepatic Neg liver ROS,GERD  ,,  Endo/Other  diabetes, Well Controlled, Type 2, Oral Hypoglycemic AgentsHypothyroidism  Hyperlipidemia  Renal/GU Renal InsufficiencyRenal diseaseRight renal calculus   Hx/o prostate Ca    Musculoskeletal  (+) Arthritis , Osteoarthritis,    Abdominal   Peds  Hematology negative hematology ROS (+) Eliquis therapy- last dose 6/11 Plavix therapy- last dose 6/11   Anesthesia Other Findings   Reproductive/Obstetrics negative OB ROS                             Anesthesia Physical Anesthesia Plan  ASA: 3 and emergent  Anesthesia Plan: General   Post-op Pain Management: Minimal or no pain anticipated   Induction: Intravenous  PONV Risk Score and Plan: 3 and Ondansetron, Dexamethasone and Treatment may vary due to age or medical condition  Airway Management Planned: LMA  Additional Equipment:   Intra-op Plan:   Post-operative Plan: Extubation in OR  Informed Consent: I have reviewed the patients History and Physical, chart, labs and discussed the procedure including the risks, benefits and alternatives for the proposed anesthesia with the patient or authorized representative who has indicated his/her understanding and acceptance.      Dental advisory given  Plan Discussed with: CRNA  Anesthesia Plan Comments:        Anesthesia Quick Evaluation

## 2023-05-02 NOTE — Progress Notes (Signed)
   Cardiologist:  Caro Hight  Subjective:  Denies SSCP, palpitations or Dyspnea Surgery tomorrow 7:30   Objective:  Vitals:   05/01/23 2055 05/02/23 0459 05/02/23 0626 05/02/23 0849  BP: 118/76 125/85  (!) 143/91  Pulse: 75 65    Resp: 18 17    Temp: 98 F (36.7 C) 97.7 F (36.5 C)    TempSrc: Oral Oral    SpO2: 97% 98%    Weight:   82 kg   Height:        Intake/Output from previous day:  Intake/Output Summary (Last 24 hours) at 05/02/2023 1057 Last data filed at 05/02/2023 0850 Gross per 24 hour  Intake 363 ml  Output 1550 ml  Net -1187 ml    Physical Exam:  Lungs clear No murmur prior sternotomy  Abdomen benign Foley with clearing hematuria No edema Palpable pedal pulses   Lab Results: Basic Metabolic Panel: Recent Labs    05/01/23 0410 05/02/23 0428  NA 136 136  K 3.9 4.1  CL 105 106  CO2 24 24  GLUCOSE 128* 131*  BUN 20 26*  CREATININE 1.33* 1.32*  CALCIUM 9.5 9.6  MG 1.8 1.9   Liver Function Tests: No results for input(s): "AST", "ALT", "ALKPHOS", "BILITOT", "PROT", "ALBUMIN" in the last 72 hours. No results for input(s): "LIPASE", "AMYLASE" in the last 72 hours. CBC: Recent Labs    05/01/23 0410 05/02/23 0428  WBC 5.9 6.1  NEUTROABS 3.2 3.7  HGB 11.4* 11.3*  HCT 34.1* 34.2*  MCV 88.1 87.9  PLT 205 240    Imaging: No results found.  Cardiac Studies:  ECG: afib RBBB   Telemetry:  afib  Echo: 06/15/22 EF 55-60% moderate bi atrial enlargement  Medications:    amLODipine  10 mg Oral Daily   atorvastatin  40 mg Oral Daily   carvedilol  6.25 mg Oral BID   Chlorhexidine Gluconate Cloth  6 each Topical Daily   clonazePAM  0.5 mg Oral QHS   enoxaparin (LOVENOX) injection  80 mg Subcutaneous Q12H   insulin aspart  0-5 Units Subcutaneous QHS   insulin aspart  0-6 Units Subcutaneous TID WC   irbesartan  300 mg Oral Daily   isosorbide mononitrate  30 mg Oral Daily   levothyroxine  175 mcg Oral Q0600   pantoprazole  40 mg Oral QHS   sodium  chloride flush  3 mL Intravenous Q12H      cefTRIAXone (ROCEPHIN)  IV 1 g (05/02/23 0706)   sodium chloride irrigation      Assessment/Plan:   Afib: CHADVASC 5 rates are fine on coreg.  Discussed with Dr Hillis Range yesterday he is fine to give last dose of Lovenox at midnight and do surgery 7:30 in am likely Eliquis PM dose after surgery CABG:  2010 no angina stable AAA post EVAR f/u [facility]  CKD:  Cr stable 1.3   Charlton Haws 05/02/2023, 10:57 AM

## 2023-05-02 NOTE — Plan of Care (Signed)
  Problem: Education: Goal: Knowledge of General Education information will improve Description: Including pain rating scale, medication(s)/side effects and non-pharmacologic comfort measures Outcome: Not Progressing   Problem: Health Behavior/Discharge Planning: Goal: Ability to manage health-related needs will improve Outcome: Not Progressing   

## 2023-05-02 NOTE — H&P (View-Only) (Signed)
  Subjective: Patient doing well.  No significant hematuria. Objective: Vital signs in last 24 hours: Temp:  [97.7 F (36.5 C)-98 F (36.7 C)] 97.7 F (36.5 C) (06/19 0459) Pulse Rate:  [65-75] 65 (06/19 0459) Resp:  [16-18] 17 (06/19 0459) BP: (118-125)/(76-85) 125/85 (06/19 0459) SpO2:  [97 %-98 %] 98 % (06/19 0459) Weight:  [82 kg] 82 kg (06/19 0626)  Intake/Output from previous day: 06/18 0701 - 06/19 0700 In: 600 [P.O.:600] Out: 1550 [Urine:1550] Intake/Output this shift: No intake/output data recorded.  Physical Exam:  Constitutional: Vital signs reviewed.  Eyes: PERRL, No scleral icterus.   Cardiovascular: RRR Pulmonary/Chest: Normal effort Abdominal: Soft. Non-tender, non-distended, bowel sounds are normal, no masses, organomegaly, or guarding present.  Genitourinary: Urine without clots.  Light pink. Extremities: No cyanosis or edema   Lab Results: Recent Labs    04/30/23 0358 05/01/23 0410 05/02/23 0428  HGB 10.7* 11.4* 11.3*  HCT 32.7* 34.1* 34.2*   BMET Recent Labs    05/01/23 0410 05/02/23 0428  NA 136 136  K 3.9 4.1  CL 105 106  CO2 24 24  GLUCOSE 128* 131*  BUN 20 26*  CREATININE 1.33* 1.32*  CALCIUM 9.5 9.6   No results for input(s): "LABPT", "INR" in the last 72 hours. No results for input(s): "LABURIN" in the last 72 hours. Results for orders placed or performed during the hospital encounter of 04/26/23  Urine Culture (for pregnant, neutropenic or urologic patients or patients with an indwelling urinary catheter)     Status: None   Collection Time: 04/27/23  9:07 AM   Specimen: Urine, Catheterized  Result Value Ref Range Status   Specimen Description   Final    URINE, CATHETERIZED Performed at [facility], [address] [place], [place] [address]    Special Requests   Final    NONE Performed at [facility], [address] [place], [place] [address]    Culture   Final    NO  GROWTH Performed at [facility], [address] [place], [place] [address]    Report Status 04/28/2023 FINAL  Final  C Difficile Quick Screen w PCR reflex     Status: None   Collection Time: 04/27/23  9:55 AM   Specimen: STOOL  Result Value Ref Range Status   C Diff antigen NEGATIVE NEGATIVE Final   C Diff toxin NEGATIVE NEGATIVE Final   C Diff interpretation No C. difficile detected.  Final    Comment: [facility]formed at  [facility], [address] [place], [place] [address]    Studies/Results: No results found.  Assessment/Plan:  Right ureteral calculus, stented with postop hematuria.  He is doing better now even with Lovenox.  Will proceed with cystoscopy, stent extraction, right ureteroscopic stone management and possible restenting in the morning.  Hopefully, if it goes well he will be able to go home tomorrow evening.   LOS: 4 days   Beryle Bagsby M Sharmila Wrobleski 05/02/2023, 7:42 AM    

## 2023-05-02 NOTE — Progress Notes (Signed)
[facility]  PROGRESS NOTE  Shiv Lauer NUU:[id number] DOB: 24-Sep-1941 DOA: 04/26/2023 PCP: [name], MD   Brief HPI:   82 y.o. male with medical history significant for hypertension, type 2 diabetes mellitus, CKD 3B, CAD, PAF on Eliquis, AAA, and recent admission for obstructing right ureteral stone complicated by sepsis secondary to pyelonephritis requiring cystoscopy with right ureteral stent placement on 04/25/2023 and discharged home on oral Bactrim presented with bloody urine leaking around the catheter and suprapubic pain with abdominal distention and diarrhea.  On presentation, creatinine was 1.67.  Urology was consulted and he was started on continuous bladder irrigation.  Urology planning on urological procedure on Thursday.  Subsequently cardiology was also consulted.     Assessment/Plan:   Gross hematuria, POA -History of recent pyonephritis secondary to right ureteral stone requiring cystoscopy and right ureteral stent placement -Urology was consulted and patient started on continuous bladder irrigation -Urology plans to do cystoscopy, stent extraction, right ureteroscopic stone management and possible restenting -Plavix and Eliquis on hold -Patient currently on full dose Lovenox -Will likely restart Eliquis in the evening after stent placement in a.m.  Paroxysmal atrial fibrillation -Heart rate is controlled -Eliquis on hold; started on full dose Lovenox -Cardiology following  CAD -Stable  Diabetes mellitus type 2 -Continue sliding scale insulin NovoLog  Hypothyroidism -Continue Synthroid  Hypertension -Continue amlodipine, Coreg, irbesartan  Thrombocytopenia -Resolved  Hyponatremia -Resolved   Acute metabolic acidosis -Resolved  Diarrhea -Resolved -Stool for C. difficile negative     Medications     amLODipine  10 mg Oral Daily   atorvastatin  40 mg Oral Daily   carvedilol  6.25 mg Oral BID   Chlorhexidine Gluconate Cloth  6  each Topical Daily   clonazePAM  0.5 mg Oral QHS   enoxaparin (LOVENOX) injection  80 mg Subcutaneous Q12H   insulin aspart  0-5 Units Subcutaneous QHS   insulin aspart  0-6 Units Subcutaneous TID WC   irbesartan  300 mg Oral Daily   isosorbide mononitrate  30 mg Oral Daily   levothyroxine  175 mcg Oral Q0600   pantoprazole  40 mg Oral QHS   sodium chloride flush  3 mL Intravenous Q12H     Data Reviewed:   CBG:  Recent Labs  Lab 05/01/23 1643 05/01/23 2052 05/02/23 0738 05/02/23 1146 05/02/23 1611  GLUCAP 189* 147* 150* 128* 179*    SpO2: 100 %    Vitals:   05/02/23 0459 05/02/23 0626 05/02/23 0849 05/02/23 1302  BP: 125/85  (!) 143/91 125/81  Pulse: 65   67  Resp: 17   18  Temp: 97.7 F (36.5 C)   97.9 F (36.6 C)  TempSrc: Oral   Oral  SpO2: 98%   100%  Weight:  82 kg    Height:          Data Reviewed:  Basic Metabolic Panel: Recent Labs  Lab 04/28/23 0459 04/29/23 0430 04/30/23 0358 05/01/23 0410 05/02/23 0428  NA 136 136 134* 136 136  K 3.5 3.9 3.4* 3.9 4.1  CL 105 104 104 105 106  CO2 24 25 23 24 24   GLUCOSE 132* 138* 144* 128* 131*  BUN 22 21 22 20  26*  CREATININE 1.49* 1.57* 1.36* 1.33* 1.32*  CALCIUM 8.9 9.3 9.4 9.5 9.6  MG  --  1.9 1.8 1.8 1.9    CBC: Recent Labs  Lab 04/27/23 0028 04/27/23 0630 04/28/23 0459 04/29/23 0430 04/30/23 0358 05/01/23 0410 05/02/23 0428  WBC 4.1   < >  3.7* 4.0 4.7 5.9 6.1  NEUTROABS 3.3  --   --   --  2.5 3.2 3.7  HGB 12.1*   < > 10.4* 10.8* 10.7* 11.4* 11.3*  HCT 34.8*   < > 31.4* 32.0* 32.7* 34.1* 34.2*  MCV 85.9   < > 88.7 88.2 88.6 88.1 87.9  PLT 155   < > 143* 157 177 205 240   < > = values in this interval not displayed.    LFT No results for input(s): "AST", "ALT", "ALKPHOS", "BILITOT", "PROT", "ALBUMIN" in the last 168 hours.   Antibiotics: Anti-infectives (From admission, onward)    Start     Dose/Rate Route Frequency Ordered Stop   04/27/23 0630  cefTRIAXone (ROCEPHIN) 1 g in  sodium chloride 0.9 % 100 mL IVPB        1 g 200 mL/hr over 30 Minutes Intravenous Every 24 hours 04/27/23 0539          DVT prophylaxis: Lovenox  Code Status: Full code  Family Communication:    CONSULTS urology, cardiology   Subjective   Patient seen and examined, hematuria has resolved.   Objective    Physical Examination:   General-appears in no acute distress Heart-S1-S2, regular, no murmur auscultated Lungs-clear to auscultation bilaterally, no wheezing or crackles auscultated Abdomen-soft, nontender, no organomegaly Extremities-no edema in the lower extremities Neuro-alert, oriented x3, no focal deficit noted   Status is: Inpatient:          Meredeth Ide   [facility] If 7PM-7AM, please contact night-coverage at [email or web address], Office  [phone]   05/02/2023, 4:19 PM  LOS: 4 days

## 2023-05-02 NOTE — Progress Notes (Signed)
  Subjective: Patient doing well.  No significant hematuria. Objective: Vital signs in last 24 hours: Temp:  [97.7 F (36.5 C)-98 F (36.7 C)] 97.7 F (36.5 C) (06/19 0459) Pulse Rate:  [65-75] 65 (06/19 0459) Resp:  [16-18] 17 (06/19 0459) BP: (118-125)/(76-85) 125/85 (06/19 0459) SpO2:  [97 %-98 %] 98 % (06/19 0459) Weight:  [82 kg] 82 kg (06/19 0626)  Intake/Output from previous day: 06/18 0701 - 06/19 0700 In: 600 [P.O.:600] Out: 1550 [Urine:1550] Intake/Output this shift: No intake/output data recorded.  Physical Exam:  Constitutional: Vital signs reviewed.  Eyes: PERRL, No scleral icterus.   Cardiovascular: RRR Pulmonary/Chest: Normal effort Abdominal: Soft. Non-tender, non-distended, bowel sounds are normal, no masses, organomegaly, or guarding present.  Genitourinary: Urine without clots.  Light pink. Extremities: No cyanosis or edema   Lab Results: Recent Labs    04/30/23 0358 05/01/23 0410 05/02/23 0428  HGB 10.7* 11.4* 11.3*  HCT 32.7* 34.1* 34.2*   BMET Recent Labs    05/01/23 0410 05/02/23 0428  NA 136 136  K 3.9 4.1  CL 105 106  CO2 24 24  GLUCOSE 128* 131*  BUN 20 26*  CREATININE 1.33* 1.32*  CALCIUM 9.5 9.6   No results for input(s): "LABPT", "INR" in the last 72 hours. No results for input(s): "LABURIN" in the last 72 hours. Results for orders placed or performed during the hospital encounter of 04/26/23  Urine Culture (for pregnant, neutropenic or urologic patients or patients with an indwelling urinary catheter)     Status: None   Collection Time: 04/27/23  9:07 AM   Specimen: Urine, Catheterized  Result Value Ref Range Status   Specimen Description   Final    URINE, CATHETERIZED Performed at [facility], [address] [place], [place] [address]    Special Requests   Final    NONE Performed at [facility], [address], [place], [place] [address]    Culture   Final    NO  GROWTH Performed at [facility], [address] [place], [place] [address]    Report Status 04/28/2023 FINAL  Final  C Difficile Quick Screen w PCR reflex     Status: None   Collection Time: 04/27/23  9:55 AM   Specimen: STOOL  Result Value Ref Range Status   C Diff antigen NEGATIVE NEGATIVE Final   C Diff toxin NEGATIVE NEGATIVE Final   C Diff interpretation No C. difficile detected.  Final    Comment: Performed at [facility], [address], [place], [place] [address]    Studies/Results: No results found.  Assessment/Plan:  Right ureteral calculus, stented with postop hematuria.  He is doing better now even with Lovenox.  Will proceed with cystoscopy, stent extraction, right ureteroscopic stone management and possible restenting in the morning.  Hopefully, if it goes well he will be able to go home tomorrow evening.   LOS: 4 days   Chelsea Aus 05/02/2023, 7:42 AM

## 2023-05-03 ENCOUNTER — Inpatient Hospital Stay (HOSPITAL_COMMUNITY): Payer: Medicare Other

## 2023-05-03 ENCOUNTER — Ambulatory Visit (HOSPITAL_COMMUNITY): Admission: RE | Admit: 2023-05-03 | Payer: Medicare Other | Source: Home / Self Care | Admitting: Urology

## 2023-05-03 ENCOUNTER — Encounter (HOSPITAL_COMMUNITY): Payer: Self-pay | Admitting: Internal Medicine

## 2023-05-03 ENCOUNTER — Inpatient Hospital Stay (HOSPITAL_COMMUNITY): Payer: Medicare Other | Admitting: Anesthesiology

## 2023-05-03 ENCOUNTER — Encounter (HOSPITAL_COMMUNITY)
Admission: EM | Disposition: A | Discharge status: Home / Self Care | Payer: Self-pay | Source: Home / Self Care | Attending: Internal Medicine

## 2023-05-03 DIAGNOSIS — I482 Chronic atrial fibrillation, unspecified: Secondary | ICD-10-CM

## 2023-05-03 DIAGNOSIS — Z5181 Encounter for therapeutic drug level monitoring: Secondary | ICD-10-CM | POA: Diagnosis not present

## 2023-05-03 DIAGNOSIS — I480 Paroxysmal atrial fibrillation: Secondary | ICD-10-CM | POA: Diagnosis not present

## 2023-05-03 DIAGNOSIS — I2510 Atherosclerotic heart disease of native coronary artery without angina pectoris: Secondary | ICD-10-CM | POA: Diagnosis not present

## 2023-05-03 DIAGNOSIS — I10 Essential (primary) hypertension: Secondary | ICD-10-CM

## 2023-05-03 DIAGNOSIS — R310 Gross hematuria: Secondary | ICD-10-CM

## 2023-05-03 DIAGNOSIS — N200 Calculus of kidney: Secondary | ICD-10-CM

## 2023-05-03 DIAGNOSIS — N132 Hydronephrosis with renal and ureteral calculous obstruction: Secondary | ICD-10-CM | POA: Diagnosis not present

## 2023-05-03 DIAGNOSIS — Z7901 Long term (current) use of anticoagulants: Secondary | ICD-10-CM | POA: Diagnosis not present

## 2023-05-03 DIAGNOSIS — N201 Calculus of ureter: Secondary | ICD-10-CM | POA: Diagnosis not present

## 2023-05-03 DIAGNOSIS — Z87891 Personal history of nicotine dependence: Secondary | ICD-10-CM

## 2023-05-03 HISTORY — PX: CYSTOSCOPY/URETEROSCOPY/HOLMIUM LASER/STENT PLACEMENT: SHX6546

## 2023-05-03 LAB — GLUCOSE, CAPILLARY
Glucose-Capillary: 135 mg/dL — ABNORMAL HIGH (ref 70–99)
Glucose-Capillary: 188 mg/dL — ABNORMAL HIGH (ref 70–99)
Glucose-Capillary: 221 mg/dL — ABNORMAL HIGH (ref 70–99)
Glucose-Capillary: 160 mg/dL — ABNORMAL HIGH (ref 70–99)
Glucose-Capillary: 263 mg/dL — ABNORMAL HIGH (ref 70–99)

## 2023-05-03 LAB — CBC
HCT: 34.3 % — ABNORMAL LOW (ref 39.0–52.0)
Hemoglobin: 11.4 g/dL — ABNORMAL LOW (ref 13.0–17.0)
MCH: 29.5 pg (ref 26.0–34.0)
MCHC: 33.2 g/dL (ref 30.0–36.0)
MCV: 88.6 fL (ref 80.0–100.0)
Platelets: 239 10*3/uL (ref 150–400)
RBC: 3.87 MIL/uL — ABNORMAL LOW (ref 4.22–5.81)
RDW: 13.2 % (ref 11.5–15.5)
WBC: 6.5 10*3/uL (ref 4.0–10.5)
nRBC: 0 % (ref 0.0–0.2)

## 2023-05-03 LAB — BASIC METABOLIC PANEL
Anion gap: 6 (ref 5–15)
BUN: 22 mg/dL (ref 8–23)
CO2: 24 mmol/L (ref 22–32)
Calcium: 9.6 mg/dL (ref 8.9–10.3)
Chloride: 106 mmol/L (ref 98–111)
Creatinine, Ser: 1.31 mg/dL — ABNORMAL HIGH (ref 0.61–1.24)
GFR, Estimated: 55 mL/min — ABNORMAL LOW (ref >60)
Glucose, Bld: 123 mg/dL — ABNORMAL HIGH (ref 70–99)
Potassium: 3.9 mmol/L (ref 3.5–5.1)
Sodium: 136 mmol/L (ref 135–145)

## 2023-05-03 SURGERY — CYSTOSCOPY/URETEROSCOPY/HOLMIUM LASER/STENT PLACEMENT
Anesthesia: General | Laterality: Right

## 2023-05-03 MED ORDER — STERILE WATER FOR IRRIGATION IR SOLN
Status: DC | PRN
  Administered 2023-05-03: 3000 mL

## 2023-05-03 MED ORDER — LIDOCAINE 2% (20 MG/ML) 5 ML SYRINGE
INTRAMUSCULAR | Status: DC | PRN
  Administered 2023-05-03: 60 mg via INTRAVENOUS

## 2023-05-03 MED ORDER — LIDOCAINE HCL (PF) 2 % IJ SOLN
INTRAMUSCULAR | Status: AC
  Filled 2023-05-03: qty 25

## 2023-05-03 MED ORDER — ONDANSETRON HCL 4 MG/2ML IJ SOLN
INTRAMUSCULAR | Status: DC | PRN
  Administered 2023-05-03: 4 mg via INTRAVENOUS

## 2023-05-03 MED ORDER — ONDANSETRON HCL 4 MG/2ML IJ SOLN
INTRAMUSCULAR | Status: AC
  Filled 2023-05-03: qty 2

## 2023-05-03 MED ORDER — FENTANYL CITRATE (PF) 100 MCG/2ML IJ SOLN
INTRAMUSCULAR | Status: AC
  Filled 2023-05-03: qty 2

## 2023-05-03 MED ORDER — DEXMEDETOMIDINE HCL IN NACL 80 MCG/20ML IV SOLN
INTRAVENOUS | Status: AC
  Filled 2023-05-03: qty 20

## 2023-05-03 MED ORDER — IOHEXOL 300 MG/ML  SOLN
INTRAMUSCULAR | Status: DC | PRN
  Administered 2023-05-03: 4 mL

## 2023-05-03 MED ORDER — ONDANSETRON HCL 4 MG/2ML IJ SOLN: 4.0000 mg | Freq: Once | INTRAMUSCULAR | Status: DC | PRN

## 2023-05-03 MED ORDER — HYDROMORPHONE HCL 1 MG/ML IJ SOLN
INTRAMUSCULAR | Status: AC
  Filled 2023-05-03: qty 1

## 2023-05-03 MED ORDER — APIXABAN 5 MG PO TABS
5.0000 mg | ORAL_TABLET | Freq: Two times a day (BID) | ORAL | Status: DC
  Administered 2023-05-03 – 2023-05-04 (×2): 5 mg via ORAL
  Filled 2023-05-03 (×2): qty 1

## 2023-05-03 MED ORDER — PROPOFOL 500 MG/50ML IV EMUL
INTRAVENOUS | Status: DC | PRN
  Administered 2023-05-03: 120 mg via INTRAVENOUS

## 2023-05-03 MED ORDER — DEXAMETHASONE SODIUM PHOSPHATE 10 MG/ML IJ SOLN
INTRAMUSCULAR | Status: AC
  Filled 2023-05-03: qty 1

## 2023-05-03 MED ORDER — EPHEDRINE 5 MG/ML INJ
INTRAVENOUS | Status: AC
  Filled 2023-05-03: qty 5

## 2023-05-03 MED ORDER — SODIUM CHLORIDE 0.9 % IR SOLN
Status: DC | PRN
  Administered 2023-05-03: 3000 mL via INTRAVESICAL

## 2023-05-03 MED ORDER — PROPOFOL 10 MG/ML IV BOLUS
INTRAVENOUS | Status: AC
  Filled 2023-05-03: qty 20

## 2023-05-03 MED ORDER — PHENYLEPHRINE 80 MCG/ML (10ML) SYRINGE FOR IV PUSH (FOR BLOOD PRESSURE SUPPORT)
PREFILLED_SYRINGE | INTRAVENOUS | Status: AC
  Filled 2023-05-03: qty 10

## 2023-05-03 MED ORDER — FENTANYL CITRATE (PF) 100 MCG/2ML IJ SOLN
INTRAMUSCULAR | Status: DC | PRN
  Administered 2023-05-03 (×2): 25 ug via INTRAVENOUS
  Administered 2023-05-03: 50 ug via INTRAVENOUS

## 2023-05-03 MED ORDER — DEXAMETHASONE SODIUM PHOSPHATE 10 MG/ML IJ SOLN
INTRAMUSCULAR | Status: DC | PRN
  Administered 2023-05-03: 10 mg via INTRAVENOUS

## 2023-05-03 MED ORDER — EPHEDRINE SULFATE-NACL 50-0.9 MG/10ML-% IV SOSY
PREFILLED_SYRINGE | INTRAVENOUS | Status: DC | PRN
  Administered 2023-05-03: 10 mg via INTRAVENOUS

## 2023-05-03 MED ORDER — HYDROMORPHONE HCL 1 MG/ML IJ SOLN
0.2500 mg | INTRAMUSCULAR | Status: DC | PRN
  Administered 2023-05-03 (×2): 0.5 mg via INTRAVENOUS

## 2023-05-03 MED ORDER — OXYCODONE HCL 5 MG PO TABS: 5.0000 mg | ORAL_TABLET | Freq: Once | ORAL | Status: DC | PRN

## 2023-05-03 MED ORDER — LACTATED RINGERS IV SOLN: INTRAVENOUS | Status: DC

## 2023-05-03 MED ORDER — OXYCODONE HCL 5 MG/5ML PO SOLN: 5.0000 mg | Freq: Once | ORAL | Status: DC | PRN

## 2023-05-03 MED ORDER — 0.9 % SODIUM CHLORIDE (POUR BTL) OPTIME
TOPICAL | Status: DC | PRN
  Administered 2023-05-03: 1000 mL

## 2023-05-03 MED ORDER — CEPHALEXIN 500 MG PO CAPS
500.0000 mg | ORAL_CAPSULE | Freq: Four times a day (QID) | ORAL | Status: DC
  Administered 2023-05-03 – 2023-05-04 (×3): 500 mg via ORAL
  Filled 2023-05-03 (×3): qty 1

## 2023-05-03 MED ORDER — HYDROMORPHONE HCL 1 MG/ML IJ SOLN
0.5000 mg | INTRAMUSCULAR | Status: DC | PRN
  Filled 2023-05-03 (×2): qty 0.5

## 2023-05-03 MED ORDER — ONDANSETRON HCL 4 MG/2ML IJ SOLN
4.0000 mg | Freq: Four times a day (QID) | INTRAMUSCULAR | Status: DC | PRN
  Administered 2023-05-03: 4 mg via INTRAVENOUS
  Filled 2023-05-03: qty 2

## 2023-05-03 MED ORDER — DEXMEDETOMIDINE HCL IN NACL 80 MCG/20ML IV SOLN
INTRAVENOUS | Status: DC | PRN
  Administered 2023-05-03: 8 ug via INTRAVENOUS

## 2023-05-03 SURGICAL SUPPLY — 24 items
BAG URO CATCHER STRL LF (MISCELLANEOUS) ×1 IMPLANT
BASKET ZERO TIP NITINOL 2.4FR (BASKET) ×1 IMPLANT
BSKT STON RTRVL ZERO TP 2.4FR (BASKET)
CATH URETL OPEN END 6FR 70 (CATHETERS) IMPLANT
ELECT REM PT RETURN 15FT ADLT (MISCELLANEOUS) IMPLANT
EXTRACTOR STONE 1.7FRX115CM (UROLOGICAL SUPPLIES) ×1 IMPLANT
GLOVE SURG LX STRL 8.0 MICRO (GLOVE) ×1 IMPLANT
GOWN STRL REUS W/ TWL XL LVL3 (GOWN DISPOSABLE) ×1 IMPLANT
GOWN STRL REUS W/TWL XL LVL3 (GOWN DISPOSABLE) ×1
GUIDEWIRE ANG ZIPWIRE 038X150 (WIRE) IMPLANT
GUIDEWIRE STR DUAL SENSOR (WIRE) ×1 IMPLANT
IV NS 1000ML (IV SOLUTION)
IV NS 1000ML BAXH (IV SOLUTION) ×1 IMPLANT
KIT TURNOVER KIT A (KITS) IMPLANT
LASER FIB FLEXIVA PULSE ID 365 (Laser) IMPLANT
MANIFOLD NEPTUNE II (INSTRUMENTS) ×1 IMPLANT
PACK CYSTO (CUSTOM PROCEDURE TRAY) ×1 IMPLANT
SHEATH NAVIGATOR HD 11/13X28 (SHEATH) IMPLANT
SHEATH NAVIGATOR HD 11/13X36 (SHEATH) IMPLANT
SHEATH NAVIGATOR HD 12/14X46 (SHEATH) IMPLANT
TRACTIP FLEXIVA PULS ID 200XHI (Laser) IMPLANT
TRACTIP FLEXIVA PULSE ID 200 (Laser)
TUBING CONNECTING 10 (TUBING) ×1 IMPLANT
TUBING UROLOGY SET (TUBING) ×1 IMPLANT

## 2023-05-03 NOTE — Interval H&P Note (Signed)
History and Physical Interval Note:  05/03/2023 7:28 AM  Garrett Jordan Sr.  has presented today for surgery, with the diagnosis of RIGHT RENAL STONE.  The various methods of treatment have been discussed with the patient and family. After consideration of risks, benefits and other options for treatment, the patient has consented to  Procedure(s) with comments: CYSTOSCOPY/URETEROSCOPY/HOLMIUM LASER/STONE EXTRACTION/STENT EXCHANGE (Right) - 1 HR as a surgical intervention.  The patient's history has been reviewed, patient examined, no change in status, stable for surgery.  I have reviewed the patient's chart and labs.  Questions were answered to the patient's satisfaction.     Bertram Millard Resean Brander

## 2023-05-03 NOTE — Progress Notes (Signed)
[facility]  PROGRESS NOTE  Garrett Jordan ZOX:[id number] DOB: 1941/07/03 DOA: 04/26/2023 PCP: [name], MD   Brief HPI:   82 y.o. male with medical history significant for hypertension, type 2 diabetes mellitus, CKD 3B, CAD, PAF on Eliquis, AAA, and recent admission for obstructing right ureteral stone complicated by sepsis secondary to pyelonephritis requiring cystoscopy with right ureteral stent placement on 04/25/2023 and discharged home on oral Bactrim presented with bloody urine leaking around the catheter and suprapubic pain with abdominal distention and diarrhea.  On presentation, creatinine was 1.67.  Urology was consulted and he was started on continuous bladder irrigation.  Urology planning on urological procedure on Thursday.  Subsequently cardiology was also consulted.     Assessment/Plan:   Gross hematuria, POA -History of recent pyonephritis secondary to right ureteral stone requiring cystoscopy and right ureteral stent placement -Urology was consulted and patient started on continuous bladder irrigation -Underwent  cystoscopy, stent extraction, right ureteroscopic stone extraction, bladder fulguration -Plavix and Eliquis on hold -Patient currently on full dose Lovenox -Will likely restart Eliquis tonight  Paroxysmal atrial fibrillation -Heart rate is controlled -Eliquis on hold; started on full dose Lovenox -Eliquis will be restarted tonight -Cardiology following  CAD -Stable  Diabetes mellitus type 2 -Continue sliding scale insulin NovoLog  Hypothyroidism -Continue Synthroid  Hypertension -Continue amlodipine, Coreg, irbesartan  Thrombocytopenia -Resolved  Hyponatremia -Resolved   Acute metabolic acidosis -Resolved  Diarrhea -Resolved -Stool for C. difficile negative     Medications     amLODipine  10 mg Oral Daily   apixaban  5 mg Oral BID   atorvastatin  40 mg Oral Daily   carvedilol  6.25 mg Oral BID   cephALEXin  500 mg  Oral Q6H   clonazePAM  0.5 mg Oral QHS   HYDROmorphone       insulin aspart  0-5 Units Subcutaneous QHS   insulin aspart  0-6 Units Subcutaneous TID WC   irbesartan  300 mg Oral Daily   isosorbide mononitrate  30 mg Oral Daily   levothyroxine  175 mcg Oral Q0600   pantoprazole  40 mg Oral QHS   sodium chloride flush  3 mL Intravenous Q12H     Data Reviewed:   CBG:  Recent Labs  Lab 05/02/23 1146 05/02/23 1611 05/02/23 2055 05/03/23 0631 05/03/23 1010  GLUCAP 128* 179* 123* 135* 188*    SpO2: 97 % O2 Flow Rate (L/min): 0 L/min    Vitals:   05/03/23 0930 05/03/23 0945 05/03/23 1000 05/03/23 1130  BP: (!) 146/93 (!) 146/86 (!) 150/95 127/84  Pulse: 66 66 70   Resp: (!) 9 13 20    Temp:  97.6 F (36.4 C) (!) 97.3 F (36.3 C)   TempSrc:   Oral   SpO2: 99% 100% 97%   Weight:      Height:          Data Reviewed:  Basic Metabolic Panel: Recent Labs  Lab 04/29/23 0430 04/30/23 0358 05/01/23 0410 05/02/23 0428 05/03/23 0422  NA 136 134* 136 136 136  K 3.9 3.4* 3.9 4.1 3.9  CL 104 104 105 106 106  CO2 25 23 24 24 24   GLUCOSE 138* 144* 128* 131* 123*  BUN 21 22 20  26* 22  CREATININE 1.57* 1.36* 1.33* 1.32* 1.31*  CALCIUM 9.3 9.4 9.5 9.6 9.6  MG 1.9 1.8 1.8 1.9  --     CBC: Recent Labs  Lab 04/27/23 0028 04/27/23 0630 04/29/23 0430 04/30/23 0358 05/01/23 0410  05/02/23 0428 05/03/23 0422  WBC 4.1   < > 4.0 4.7 5.9 6.1 6.5  NEUTROABS 3.3  --   --  2.5 3.2 3.7  --   HGB 12.1*   < > 10.8* 10.7* 11.4* 11.3* 11.4*  HCT 34.8*   < > 32.0* 32.7* 34.1* 34.2* 34.3*  MCV 85.9   < > 88.2 88.6 88.1 87.9 88.6  PLT 155   < > 157 177 205 240 239   < > = values in this interval not displayed.    LFT No results for input(s): "AST", "ALT", "ALKPHOS", "BILITOT", "PROT", "ALBUMIN" in the last 168 hours.   Antibiotics: Anti-infectives (From admission, onward)    Start     Dose/Rate Route Frequency Ordered Stop   05/03/23 1800  cephALEXin (KEFLEX) capsule 500 mg         500 mg Oral Every 6 hours 05/03/23 0844     04/27/23 0630  cefTRIAXone (ROCEPHIN) 1 g in sodium chloride 0.9 % 100 mL IVPB  Status:  Discontinued        1 g 200 mL/hr over 30 Minutes Intravenous Every 24 hours 04/27/23 0539 05/03/23 0958        DVT prophylaxis: Lovenox  Code Status: Full code  Family Communication:    CONSULTS urology, cardiology   Subjective   Patient seen and examined, underwent cystoscopy with right double-J stent extraction, right retrograde ureteropyelogram, extraction of right ureteral stone and bladder fulguration.  Complains of nausea after procedure.  Objective    Physical Examination:  General-appears in no acute distress Heart-S1-S2, regular, no murmur auscultated Lungs-clear to auscultation bilaterally, no wheezing or crackles auscultated Abdomen-soft, nontender, no organomegaly Extremities-no edema in the lower extremities Neuro-alert, oriented x3, no focal deficit noted   Status is: Inpatient:          Meredeth Ide   [facility] If 7PM-7AM, please contact night-coverage at [email or web address], Office  [phone]   05/03/2023, 11:48 AM  LOS: 5 days

## 2023-05-03 NOTE — Anesthesia Procedure Notes (Signed)
Procedure Name: LMA Insertion Date/Time: 05/03/2023 7:43 AM  Performed by: Basilio Cairo, CRNAPre-anesthesia Checklist: Patient identified, Patient being monitored, Timeout performed, Emergency Drugs available and Suction available Patient Re-evaluated:Patient Re-evaluated prior to induction Oxygen Delivery Method: Circle system utilized Preoxygenation: Pre-oxygenation with 100% oxygen Induction Type: IV induction Ventilation: Mask ventilation without difficulty LMA: LMA inserted LMA Size: 4.0 Tube type: Oral Number of attempts: 1 Placement Confirmation: positive ETCO2 and breath sounds checked- equal and bilateral Tube secured with: Tape Dental Injury: Teeth and Oropharynx as per pre-operative assessment

## 2023-05-03 NOTE — Discharge Instructions (Signed)
You may see some blood in the urine and may have some burning with urination for 48-72 hours. You also may notice that you have to urinate more frequently or urgently after your procedure which is normal.  You should call should you develop an inability urinate, fever > 101, persistent nausea and vomiting that prevents you from eating or drinking to stay hydrated.  If you have a stent, you will likely urinate more frequently and urgently until the stent is removed and you may experience some discomfort/pain in the lower abdomen and flank especially when urinating. You may take pain medication prescribed to you if needed for pain. You may also intermittently have blood in the urine until the stent is removed. If you have a catheter, you will be taught how to take care of the catheter by the nursing staff prior to discharge from the hospital.  You may periodically feel a strong urge to void with the catheter in place.  This is a bladder spasm and most often can occur when having a bowel movement or moving around. It is typically self-limited and usually will stop after a few minutes.  You may use some Vaseline or Neosporin around the tip of the catheter to reduce friction at the tip of the penis. You may also see some blood in the urine.  A very small amount of blood can make the urine look quite red.  As long as the catheter is draining well, there usually is not a problem.  However, if the catheter is not draining well and is bloody, you should call the office ([phone]) to notify us.    

## 2023-05-03 NOTE — Progress Notes (Signed)
ANTICOAGULATION CONSULT NOTE - Initial Consult  Pharmacy Consult for Apixaban Indication: atrial fibrillation  Allergies  Allergen Reactions   Quinolones Other (See Comments)    Aortic root enlargement   Ace Inhibitors Cough   Crestor [Rosuvastatin] Other (See Comments)    Muscle aches   Nitroglycerin Other (See Comments)    Very pronounced lowered B/P with IV nitro for heart caths    Patient Measurements: Height: 5\' 9"  (175.3 cm) Weight: 82 kg (180 lb 12.4 oz) IBW/kg (Calculated) : 70.7   Vital Signs: Temp: 97.3 F (36.3 C) (06/20 1000) Temp Source: Oral (06/20 1000) BP: 150/95 (06/20 1000) Pulse Rate: 70 (06/20 1000)  Labs: Recent Labs    05/01/23 0410 05/02/23 0428 05/03/23 0422  HGB 11.4* 11.3* 11.4*  HCT 34.1* 34.2* 34.3*  PLT 205 240 239  CREATININE 1.33* 1.32* 1.31*     Estimated Creatinine Clearance: 45 mL/min (A) (by C-G formula based on SCr of 1.31 mg/dL (H)).   Medical History: Past Medical History:  Diagnosis Date   AAA (abdominal aortic aneurysm) (HCC)    Arthritis    knees   Atrial fibrillation (HCC)    CAD (coronary artery disease)    Cancer (HCC)    prostate   CKD (chronic kidney disease), stage III (HCC) 06/18/2022   COVID    06/2019 and in 2022 states they were mild cases   Diabetes mellitus without complication (HCC)    Dyspnea    once in a while   Dysrhythmia    A-fib pt is on Eliquis   GERD (gastroesophageal reflux disease)    Gout    History of kidney stones    Hyperlipidemia    Hypertension    Hypothyroidism    Myocardial infarction (HCC) 01/1991   sp inferior   Nerve compression    right leg   Reflux    Seasonal allergies    Thoracic ascending aortic aneurysm (HCC)    Thyroid disease    hypothyroidism    Medications:  PTA Eliquis 5mg  bid (last dose 6/11 at 1000)  Assessment: 82 y.o. male with medical history significant for hypertension, type 2 diabetes mellitus, CKD 3B, CAD, PAF on Eliquis, AAA, and recent  admission for obstructing right ureteral stone complicated by sepsis secondary to pyelonephritis requiring cystoscopy with right ureteral stent placement on 04/25/2023 and discharged home on oral Bactrim presented with bloody urine leaking around the catheter and suprapubic pain with abdominal distention and diarrhea.  On presentation, creatinine was 1.67.    Urology was consulted and he was started on continuous bladder irrigation. CBI was clamped 6/16 and is clear per Urology note on 6/17.  Went to OR on 6/20 for Cystoscopy/ Ureteroscopy/ Holmium Laser/ Stone Extraction/ Stent Exchange.   Eliquis has been on hold this admission.  And on 6/17 patient started Lovenox 1mg /kg sq q12h. Last dose 6/19 at 2326. Pharmacy is consulted to transition Lovenox to home Eliquis for atrial fibrillation post procedure.   Goal of Therapy:  Monitor platelets by anticoagulation protocol: Yes  Today, 05/03/23  - Scr = 1.31 mg/dL - Hgb = 29.5 g/dL, low but stable - Plt 621 K/uL, WNL   Plan:  - Start Eliquis 5mg  bid, first dose 6/20 at 1800. (skip 6/20 2200 dose). - On 6/20 I spoke with Dr. Eden Emms about above Eliquis re-start time. (He confirmed he discussed with Dr. Retta Diones). - CBC tomorrow.  Plan for CBC/Scr weekly and as needed. - Monitor for signs/ symptoms of bleeding.   Tracker Mance  Earl Lites, PharmD Clinical Pharmacist 05/03/2023 11:26 AM

## 2023-05-03 NOTE — Plan of Care (Signed)
  Problem: Education: Goal: Knowledge of General Education information will improve Description: Including pain rating scale, medication(s)/side effects and non-pharmacologic comfort measures Outcome: Not Progressing   Problem: Health Behavior/Discharge Planning: Goal: Ability to manage health-related needs will improve Outcome: Not Progressing   

## 2023-05-03 NOTE — Op Note (Signed)
Preoperative: Right distal ureteral calculus, gross hematuria  Postoperative diagnosis: Same  Procedure: Cystoscopy, right double-J stent extraction, right retrograde ureteropyelogram, fluoroscopy used for under 1 hour, right ureteroscopy, holmium laser fragmentation and extraction of right ureteral stone, bladder fulguration  Surgeon: Spyridon Hornstein  Anesthesia: General with LMA  Complications: None  Specimen: Stone fragments  Drains: None  Estimated blood loss: Less than 25 mL  Indications: 82 year old white male with right distal ureteral stone.  This was urgently stented several days ago.  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  He has required CBI up till 2 to 3 days ago.  He has continued on Lovenox which was stopped last night.  He presents at this time for definitive management of his ureteral stone as well as possible fulguration of any bleeding vessels.  I discussed the procedure with the patient, expected outcomes, risks and complications.  He understands and desires to proceed.  Findings: Urethra was normal.  Telangiectasias within the prostatic urethra as well as the bladder neck posteriorly.  No bladder lesions were noted.  Ureteral orifices were normal in location and configuration.  Double-J stent present at the right orifice.  Description of procedure: Patient properly identified and marked in the holding area.  Taken to the operating room where general anesthetic was administered.  He is placed in the dorsolithotomy position.  Genitalia and perineum were prepped, draped, proper timeout performed.  21 [place] panendoscope advanced under direct vision.  The stent was grasped and removed intact.  Scope was replaced, using 6 [place] open-ended catheter retrograde ureteropyelogram was performed utilizing Omnipaque.  There was a filling defect approximately 6 cm proximal to the right  UVJ.  No other ureteral abnormalities were noted.  Pyelocalyceal system was normal.  Sensor tip guidewire was placed.  Cystoscope and open-ended catheter were then removed.  6 French dual-lumen semirigid ureteroscope was advanced into the bladder, and then up into the ureter where the stone was identified.  First I tried grasping and extracting it.  It was too large, however.  365 m fiber was then utilized to deliver energy to the stone which was then fragmented to multiple smaller fragments.  Fiber was then removed, engage basket was utilized to remove all stone fragments which were deposited in the bladder.  The scope was then advanced up towards the UPJ.  No further stones were seen.  The ureteroscope was then removed.  I did not feel stent needed to be replaced.  Cystoscope was then replaced, stone fragments were drained from the bladder and saved for specimen.  I then passed a Bugbee electrode.  Small telangiectatic vessels that were bleeding were cauterized within the prostatic urethra as well as a posterior bladder neck around the ureteral orifice.  Careful inspection, no further bleeding was seen.  I did not leave a Foley catheter in but the bladder was drained with the scope which was removed.  At this point the procedure was terminated.  The patient was awakened, taken to the PACU in stable condition having tolerated the procedure well.

## 2023-05-03 NOTE — Transfer of Care (Signed)
Immediate Anesthesia Transfer of Care Note  Patient: Garrett UMBAUGH Sr.  Procedure(s) Performed: Procedure(s) with comments: CYSTOSCOPY/URETEROSCOPY/HOLMIUM LASER/STONE EXTRACTION/STENT REMOVAL, FULGURATION (Right) - 1 HR  Patient Location: PACU  Anesthesia Type:General  Level of Consciousness: Alert, Awake, Oriented  Airway & Oxygen Therapy: Patient Spontanous Breathing  Post-op Assessment: Report given to RN  Post vital signs: Reviewed and stable  Last Vitals:  Vitals:   05/03/23 0624 05/03/23 0832  BP: (!) 140/78 (!) 142/86  Pulse: 62 71  Resp: 18 10  Temp: 36.5 C 36.6 C  SpO2: 97% 100%    Complications: No apparent anesthesia complications

## 2023-05-03 NOTE — Progress Notes (Signed)
   Cardiologist:  Caro Hight  Subjective:  Post cystoscopy with laser and stent retrieval seen in PACU  Objective:  Vitals:   05/03/23 0915 05/03/23 0930 05/03/23 0945 05/03/23 1000  BP: (!) 150/81 (!) 146/93 (!) 146/86 (!) 150/95  Pulse: 65 66 66 70  Resp: 11 (!) 9 13 20   Temp:   97.6 F (36.4 C) (!) 97.3 F (36.3 C)  TempSrc:    Oral  SpO2: 100% 99% 100% 97%  Weight:      Height:        Intake/Output from previous day:  Intake/Output Summary (Last 24 hours) at 05/03/2023 1019 Last data filed at 05/03/2023 0945 Gross per 24 hour  Intake 1960 ml  Output 2400 ml  Net -440 ml    Physical Exam:  Lungs clear No murmur prior sternotomy  Abdomen benign Foley with clearing hematuria No edema Palpable pedal pulses   Lab Results: Basic Metabolic Panel: Recent Labs    05/01/23 0410 05/02/23 0428 05/03/23 0422  NA 136 136 136  K 3.9 4.1 3.9  CL 105 106 106  CO2 24 24 24   GLUCOSE 128* 131* 123*  BUN 20 26* 22  CREATININE 1.33* 1.32* 1.31*  CALCIUM 9.5 9.6 9.6  MG 1.8 1.9  --    Liver Function Tests: No results for input(s): "AST", "ALT", "ALKPHOS", "BILITOT", "PROT", "ALBUMIN" in the last 72 hours. No results for input(s): "LIPASE", "AMYLASE" in the last 72 hours. CBC: Recent Labs    05/01/23 0410 05/02/23 0428 05/03/23 0422  WBC 5.9 6.1 6.5  NEUTROABS 3.2 3.7  --   HGB 11.4* 11.3* 11.4*  HCT 34.1* 34.2* 34.3*  MCV 88.1 87.9 88.6  PLT 205 240 239    Imaging: DG C-Arm 1-60 Min-No Report  Result Date: 05/03/2023 Fluoroscopy was utilized by the requesting physician.  No radiographic interpretation.    Cardiac Studies:  ECG: afib RBBB   Telemetry:  afib  Echo: 06/15/22 EF 55-60% moderate bi atrial enlargement  Medications:    amLODipine  10 mg Oral Daily   atorvastatin  40 mg Oral Daily   carvedilol  6.25 mg Oral BID   cephALEXin  500 mg Oral Q12H   clonazePAM  0.5 mg Oral QHS   HYDROmorphone       insulin aspart  0-5 Units Subcutaneous QHS    insulin aspart  0-6 Units Subcutaneous TID WC   irbesartan  300 mg Oral Daily   isosorbide mononitrate  30 mg Oral Daily   levothyroxine  175 mcg Oral Q0600   pantoprazole  40 mg Oral QHS   sodium chloride flush  3 mL Intravenous Q12H        Assessment/Plan:   Afib: CHADVASC 5 rates are fine on coreg.  Discussed with Dr Hillis Range prior to surgery will restart elquis with PM dose today CABG:  2010 no angina stable AAA post EVAR f/u [facility]  CKD:  Cr stable 1.3   Cardiology will sign off   Charlton Haws 05/03/2023, 10:19 AM

## 2023-05-03 NOTE — Significant Event (Signed)
Rapid Response Event Note   Reason for Call :   Nurse concerned that patient was diaphoretic after his procedure and temperature was 95.4     Initial Focused Assessment:  Patient alert and oriented x 4, skin warm and dry at this time.      Interventions:  Monitor per MEWS  protocol    Plan of Care:  Notify Dr. Retta Diones Monitor MEWS per protocol  Notified Dr. Julious Payer  Cardiac Monitoring x 24        MD Notified:  Notified per Primary Nurse  Call Time:  Arrival Time: End Time:  Sharyn Lull Khairi Garman, RN

## 2023-05-03 NOTE — Plan of Care (Signed)
  Problem: Pain Managment: Goal: General experience of comfort will improve Outcome: Progressing   Problem: Education: Goal: Knowledge of General Education information will improve Description: Including pain rating scale, medication(s)/side effects and non-pharmacologic comfort measures Outcome: Adequate for Discharge   Problem: Health Behavior/Discharge Planning: Goal: Ability to manage health-related needs will improve Outcome: Adequate for Discharge   Problem: Clinical Measurements: Goal: Ability to maintain clinical measurements within normal limits will improve Outcome: Adequate for Discharge   Problem: Activity: Goal: Risk for activity intolerance will decrease Outcome: Adequate for Discharge   Problem: Elimination: Goal: Will not experience complications related to bowel motility Outcome: Adequate for Discharge Goal: Will not experience complications related to urinary retention Outcome: Adequate for Discharge

## 2023-05-03 NOTE — Anesthesia Postprocedure Evaluation (Signed)
Anesthesia Post Note  Patient: Garrett CROSSEN Sr.  Procedure(s) Performed: CYSTOSCOPY/URETEROSCOPY/HOLMIUM LASER/STONE EXTRACTION/STENT REMOVAL, FULGURATION (Right)     Patient location during evaluation: PACU Anesthesia Type: General Level of consciousness: awake and alert and oriented Pain management: pain level controlled Vital Signs Assessment: post-procedure vital signs reviewed and stable Respiratory status: spontaneous breathing, nonlabored ventilation and respiratory function stable Cardiovascular status: blood pressure returned to baseline and stable Postop Assessment: no apparent nausea or vomiting Anesthetic complications: no   No notable events documented.  Last Vitals:  Vitals:   05/03/23 0915 05/03/23 0930  BP: (!) 150/81 (!) 146/93  Pulse: 65 66  Resp: 11 (!) 9  Temp:    SpO2: 100% 99%    Last Pain:  Vitals:   05/03/23 0915  TempSrc:   PainSc: 5                  Myriam Brandhorst A.

## 2023-05-03 NOTE — Progress Notes (Signed)
PHARMACY NOTE:  ANTIMICROBIAL RENAL DOSAGE ADJUSTMENT  Current antimicrobial regimen includes a mismatch between antimicrobial dosage and estimated renal function.  As per policy approved by the Pharmacy & Therapeutics and Medical Executive Committees, the antimicrobial dosage will be adjusted accordingly.  Current antimicrobial dosage:  cephalexin 500 mg PO BID  Indication: s/p cystoscopy/ureteroscopy/stone extraction/stent removal  Renal Function:  Estimated Creatinine Clearance: 45 mL/min (A) (by C-G formula based on SCr of 1.31 mg/dL (H)). []      On intermittent HD, scheduled: []      On CRRT    Antimicrobial dosage has been changed to:  cephalexin 500 mg q6h  Cindi Carbon, PharmD 05/03/23 11:08 AM

## 2023-05-04 ENCOUNTER — Encounter (HOSPITAL_COMMUNITY): Payer: Self-pay | Admitting: Urology

## 2023-05-04 DIAGNOSIS — I480 Paroxysmal atrial fibrillation: Secondary | ICD-10-CM | POA: Diagnosis not present

## 2023-05-04 DIAGNOSIS — Z5181 Encounter for therapeutic drug level monitoring: Secondary | ICD-10-CM | POA: Diagnosis not present

## 2023-05-04 DIAGNOSIS — N132 Hydronephrosis with renal and ureteral calculous obstruction: Secondary | ICD-10-CM | POA: Diagnosis not present

## 2023-05-04 DIAGNOSIS — Z7901 Long term (current) use of anticoagulants: Secondary | ICD-10-CM | POA: Diagnosis not present

## 2023-05-04 DIAGNOSIS — R310 Gross hematuria: Secondary | ICD-10-CM | POA: Diagnosis not present

## 2023-05-04 DIAGNOSIS — I2510 Atherosclerotic heart disease of native coronary artery without angina pectoris: Secondary | ICD-10-CM | POA: Diagnosis not present

## 2023-05-04 LAB — GLUCOSE, CAPILLARY
Glucose-Capillary: 153 mg/dL — ABNORMAL HIGH (ref 70–99)
Glucose-Capillary: 262 mg/dL — ABNORMAL HIGH (ref 70–99)

## 2023-05-04 LAB — CBC
HCT: 34.8 % — ABNORMAL LOW (ref 39.0–52.0)
Hemoglobin: 11.7 g/dL — ABNORMAL LOW (ref 13.0–17.0)
MCH: 29.7 pg (ref 26.0–34.0)
MCHC: 33.6 g/dL (ref 30.0–36.0)
MCV: 88.3 fL (ref 80.0–100.0)
Platelets: 281 10*3/uL (ref 150–400)
RBC: 3.94 MIL/uL — ABNORMAL LOW (ref 4.22–5.81)
RDW: 13.2 % (ref 11.5–15.5)
WBC: 12 10*3/uL — ABNORMAL HIGH (ref 4.0–10.5)
nRBC: 0 % (ref 0.0–0.2)

## 2023-05-04 MED ORDER — INSULIN ASPART 100 UNIT/ML IJ SOLN: 0.0000 [IU] | Freq: Every day | INTRAMUSCULAR | Status: DC

## 2023-05-04 MED ORDER — TAMSULOSIN HCL 0.4 MG PO CAPS: 0.4000 mg | ORAL_CAPSULE | Freq: Every day | ORAL | 2 refills | Status: AC

## 2023-05-04 MED ORDER — TAMSULOSIN HCL 0.4 MG PO CAPS
0.4000 mg | ORAL_CAPSULE | Freq: Every day | ORAL | Status: DC
  Administered 2023-05-04: 0.4 mg via ORAL
  Filled 2023-05-04: qty 1

## 2023-05-04 MED ORDER — INSULIN ASPART 100 UNIT/ML IJ SOLN
0.0000 [IU] | Freq: Three times a day (TID) | INTRAMUSCULAR | Status: DC
  Administered 2023-05-04: 8 [IU] via SUBCUTANEOUS

## 2023-05-04 NOTE — Discharge Summary (Signed)
Physician Discharge Summary   Patient: Garrett LILLER Sr. MRN: [id number] DOB: September 11, 1941  Admit date:     04/26/2023  Discharge date: 05/04/23  Discharge Physician: Meredeth Ide   PCP: [name], MD   Recommendations at discharge:   Follow-up urology as outpatient Follow-up cardiology: As outpatient  Discharge Diagnoses: Principal Problem:   Gross hematuria Active Problems:   CAD (coronary artery disease)   Ureteral stone with hydronephrosis   Paroxysmal A-fib (HCC)   Essential hypertension   Type 2 diabetes mellitus with stage 3b chronic kidney disease, without long-term current use of insulin (HCC)   Hypothyroidism   AAA (abdominal aortic aneurysm) without rupture (HCC)   CKD (chronic kidney disease), stage III (HCC)   UTI (urinary tract infection)   Hematuria  Resolved Problems:   * No resolved hospital problems. *  Hospital Course:  82 y.o. male with medical history significant for hypertension, type 2 diabetes mellitus, CKD 3B, CAD, PAF on Eliquis, AAA, and recent admission for obstructing right ureteral stone complicated by sepsis secondary to pyelonephritis requiring cystoscopy with right ureteral stent placement on 04/25/2023 and discharged home on oral Bactrim presented with bloody urine leaking around the catheter and suprapubic pain with abdominal distention and diarrhea.  On presentation, creatinine was 1.67.  Urology was consulted and he was started on continuous bladder irrigation.  Urology planning on urological procedure on Thursday.  Subsequently cardiology was also consulted.     Assessment and Plan:  Gross hematuria, POA -History of recent pyonephritis secondary to right ureteral stone requiring cystoscopy and right ureteral stent placement -Urology was consulted and patient started on continuous bladder irrigation -Underwent  cystoscopy, stent extraction, right ureteroscopic stone extraction, bladder fulguration -Plavix and Eliquis were    hold -Patient was started on Lovenox -Both Eliquis and Plavix has been restarted -Patient will complete antibiotics Bactrim 1 tablet p.o. twice daily which he has at home as prescribed on June 13. -Change Flomax to 0.4 mg daily    Paroxysmal atrial fibrillation -Heart rate is controlled -Eliquis on hold; started on full dose Lovenox -Eliquis has been restarted   CAD -Stable -Continue Plavix   Diabetes mellitus type 2 -Blood glucose stable -Continue home regimen   Hypothyroidism -Continue Synthroid   Hypertension -Continue amlodipine, Coreg, irbesartan   Thrombocytopenia -Resolved   Hyponatremia -Resolved    Acute metabolic acidosis -Resolved   Diarrhea -Resolved -Stool for C. difficile negative          Consultants:  Procedures performed:  Disposition: Home Diet recommendation:  Discharge Diet Orders (From admission, onward)     Start     Ordered   05/04/23 0000  Diet - low sodium heart healthy        05/04/23 1212           Regular diet DISCHARGE MEDICATION: Allergies as of 05/04/2023       Reactions   Quinolones Other (See Comments)   Aortic root enlargement   Ace Inhibitors Cough   Crestor [rosuvastatin] Other (See Comments)   Muscle aches   Nitroglycerin Other (See Comments)   Very pronounced lowered B/P with IV nitro for heart caths        Medication List     TAKE these medications    Accu-Chek Guide test strip Generic drug: glucose blood USE TWICE DAILY   acetaminophen 650 MG CR tablet Commonly known as: TYLENOL Take 1,950 mg by mouth 2 (two) times daily as needed for pain.   allopurinol 300 MG  tablet Commonly known as: ZYLOPRIM Take 300 mg by mouth See admin instructions. Take 300 mg by mouth every other evening   amLODipine 10 MG tablet Commonly known as: NORVASC Take 1 tablet (10 mg total) by mouth daily. What changed: when to take this   apixaban 5 MG Tabs tablet Commonly known as: Eliquis Take 1 tablet (5 mg  total) by mouth 2 (two) times daily.   Aspercreme Lidocaine 4 % Generic drug: lidocaine Place 1 patch onto the skin daily as needed (pain).   atorvastatin 40 MG tablet Commonly known as: LIPITOR Take 1 tablet (40 mg total) by mouth daily. What changed:  when to take this additional instructions   carvedilol 6.25 MG tablet Commonly known as: COREG Take 1 tablet (6.25 mg total) by mouth 2 (two) times daily.   CHOLESTOFF PLUS PO Take 1 capsule by mouth daily.   clonazePAM 1 MG tablet Commonly known as: KLONOPIN Take 0.5 mg by mouth See admin instructions. Take 0.5 mg by mouth at bedtime and an additional 0.5 mg once a day as needed for anxiety   clopidogrel 75 MG tablet Commonly known as: PLAVIX Take 1 tablet (75 mg total) by mouth daily.   Coenzyme Q10 300 MG Caps Take 300 mg by mouth in the morning.   dapagliflozin propanediol 10 MG Tabs tablet Commonly known as: Farxiga Take 1 tablet (10 mg total) by mouth daily before breakfast.   ezetimibe 10 MG tablet Commonly known as: ZETIA TAKE 1 TABLET BY MOUTH DAILY   fenofibrate micronized 200 MG capsule Commonly known as: LOFIBRA TAKE 1 CAPSULE BY MOUTH DAILY  BEFORE BREAKFAST What changed: See the new instructions.   fluorouracil 5 % cream Commonly known as: EFUDEX Apply 1 Application topically See admin instructions. Apply to affected area of the face 2 times a day AS DIRECTED   fluticasone 50 MCG/ACT nasal spray Commonly known as: FLONASE Place 2 sprays into both nostrils daily as needed for allergies.   glipiZIDE 5 MG tablet Commonly known as: GLUCOTROL Take 0.5 tablets (2.5 mg total) by mouth 2 (two) times daily before a meal.   isosorbide mononitrate 30 MG 24 hr tablet Commonly known as: IMDUR Take 1 tablet (30 mg total) by mouth daily.   levothyroxine 175 MCG tablet Commonly known as: SYNTHROID Take 175 mcg by mouth daily before breakfast.   loratadine 10 MG tablet Commonly known as: CLARITIN Take 10  mg by mouth at bedtime.   magnesium oxide 400 (240 Mg) MG tablet Commonly known as: MAG-OX Take 400 mg by mouth every evening.   nitroGLYCERIN 0.4 MG SL tablet Commonly known as: Nitrostat Place 1 tablet (0.4 mg total) under the tongue every 5 (five) minutes as needed for chest pain.   olmesartan 40 MG tablet Commonly known as: BENICAR Take 1 tablet (40 mg total) by mouth daily. What changed: when to take this   Oxycodone HCl 10 MG Tabs Take 1 tablet (10 mg total) by mouth every 4 (four) hours as needed for severe pain ((score 7 to 10)).   pantoprazole 40 MG tablet Commonly known as: PROTONIX TAKE 1 TABLET BY MOUTH DAILY What changed: when to take this   PreserVision AREDS 2 Caps Take 1 capsule by mouth 2 (two) times daily.   sulfamethoxazole-trimethoprim 400-80 MG tablet Commonly known as: BACTRIM Take 1 tablet by mouth every 12 (twelve) hours for 14 days.   SYSTANE OP Place 1 drop into both eyes daily as needed (dry/irritated eyes).   tamsulosin  0.4 MG Caps capsule Commonly known as: FLOMAX Take 1 capsule (0.4 mg total) by mouth daily. Start taking on: May 05, 2023 What changed: when to take this   triamcinolone cream 0.1 % Commonly known as: KENALOG Apply 1 application  topically daily as needed (rash).   TURMERIC PO Take 2 tablets by mouth See admin instructions. Chew 2 gummies by mouth two times a week   Vascepa 1 g capsule Generic drug: icosapent Ethyl TAKE 2 CAPSULES BY MOUTH TWICE A DAY   Vitamin C 500 MG Caps Take 500 mg by mouth every evening.   Vitamin D3 50 MCG (2000 UT) Tabs Take 2,000 Units by mouth daily.        Follow-up Information     [facility] Follow up.   Why: We will call you to schedule a follow-up appointment Contact information: [address] [place] [address] [place] [address] [phone]               Discharge Exam: Ceasar Mons Weights   04/30/23 2025 05/01/23 0455 05/02/23 0626  Weight: 81.7 kg  81.8 kg 82 kg   General-appears in no acute distress Heart-S1-S2, regular, no murmur auscultated Lungs-clear to auscultation bilaterally, no wheezing or crackles auscultated Abdomen-soft, nontender, no organomegaly Extremities-no edema in the lower extremities Neuro-alert, oriented x3, no focal deficit noted  Condition at discharge: good  The results of significant diagnostics from this hospitalization (including imaging, microbiology, ancillary and laboratory) are listed below for reference.   Imaging Studies: DG C-Arm 1-60 Min-No Report  Result Date: 05/03/2023 Fluoroscopy was utilized by the requesting physician.  No radiographic interpretation.   CT Renal Stone Study  Result Date: 04/26/2023 CLINICAL DATA:  Status post right ureteral stent placement 04/25/2023 now with urinary retention EXAM: CT ABDOMEN AND PELVIS WITHOUT CONTRAST TECHNIQUE: Multidetector CT imaging of the abdomen and pelvis was performed following the standard protocol without IV contrast. RADIATION DOSE REDUCTION: This exam was performed according to the departmental dose-optimization program which includes automated exposure control, adjustment of the mA and/or kV according to patient size and/or use of iterative reconstruction technique. COMPARISON:  CT abdomen and pelvis dated 04/24/2023 FINDINGS: Lower chest: No focal consolidation or pulmonary nodule in the lung bases. New trace bilateral pleural effusions. Partially imaged heart size is normal. Coronary artery calcifications. Hepatobiliary: No focal hepatic lesions. No intra or extrahepatic biliary ductal dilation. Cholelithiasis. New mild surrounding stranding. Pancreas: No focal lesions or main ductal dilation. Spleen: Enlarged in the AP dimension, 15.1 cm, previously 14.1 cm. Adrenals/Urinary Tract: No adrenal nodules. Right ureteral stent in-situ with proximal pigtail in the upper pole calyces and distal pigtail within the urinary bladder. Similar position of 6 mm  stone within the distal right ureter adjacent to the stent (3:66). Unchanged 5 mm stone within the left renal pelvis (3:37). Unchanged additional nonobstructing stone within the left upper pole. No hydronephrosis. Right interpolar simple-appearing cysts. No specific follow-up imaging recommended. Urinary bladder is decompressed with catheter in-situ and contains small volume air. Stomach/Bowel: Normal appearance of the stomach. No evidence of bowel wall thickening, distention, or inflammatory changes. Normal appendix. Vascular/Lymphatic: Aortic atherosclerosis. Prior right iliac embolization. Aorto bi-iliac stent in-situ. Infrarenal abdominal aortic aneurysm measures 6.8 x 4.9 cm, unchanged. 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 Reproductive: Normal prostate size.  Brachytherapy seeds. Other: Small volume pelvic free fluid. No free air or fluid collection. Musculoskeletal: No acute or abnormal lytic or blastic osseous lesions. Prior L2-3 spinal fixation. Hardware  appears intact. Multilevel degenerative changes of the partially imaged thoracic and lumbar spine. Small fat-containing bilateral inguinal and paraumbilical hernias. IMPRESSION: 1. Right ureteral stent in-situ with proximal pigtail in the upper pole calyces and distal pigtail within the urinary bladder. Similar position of 6 mm stone within the distal right ureter adjacent to the stent. 2. Unchanged 5 mm stone within the left renal pelvis. Additional left nephrolithiasis. 3. Cholelithiasis with new mild surrounding stranding, which may be secondary to overall volume status. Recommend correlation with physical examination for right upper quadrant tenderness. 4. New trace bilateral pleural effusions. 5. Unchanged infrarenal abdominal aortic aneurysm measuring up to 6.8 cm with aorto bi-iliac stent in-situ. Recommend clinical follow-up as per vascular specialist. 6. Splenomegaly. 7. Aortic Atherosclerosis (ICD10-I70.0). Coronary  artery calcifications. Assessment for potential risk factor modification, dietary therapy or pharmacologic therapy may be warranted, if clinically indicated. Electronically Signed   By: Agustin Cree M.D.   On: 04/26/2023 10:00   DG C-Arm 1-60 Min  Result Date: 04/25/2023 CLINICAL DATA:  Retrograde urography, intraoperative examination EXAM: DG C-ARM 1-60 MIN CONTRAST:  Not listed, refer to operative report. FLUOROSCOPY: Fluoroscopy Time:  24 seconds Radiation Exposure Index (if provided by the fluoroscopic device): 4.42 mGy Number of Acquired Spot Images: 3 COMPARISON:  CT 04/24/2023 FINDINGS: Initial image demonstrates retrograde urography with a few small angular filling defects within the distal right ureter likely representing residual debris. The 6 mm calculus noted on prior CT examination is not clearly identified, likely removed in the interval. No hydroureter. Subsequent images demonstrate contrast opacification of the right pelvicaliceal system and deployment of a ureteral stent extending from the upper pole calices to the bladder. No definite extraluminal extension of contrast. IMPRESSION: 1. Retrograde urography with right ureteral stent placement. No definite extraluminal extension of contrast. Electronically Signed   By: Helyn Numbers M.D.   On: 04/25/2023 02:22   CT Renal Stone Study  Result Date: 04/24/2023 CLINICAL DATA:  Abdominal/flank pain, stone suspected EXAM: CT ABDOMEN AND PELVIS WITHOUT CONTRAST TECHNIQUE: Multidetector CT imaging of the abdomen and pelvis was performed following the standard protocol without IV contrast. RADIATION DOSE REDUCTION: This exam was performed according to the departmental dose-optimization program which includes automated exposure control, adjustment of the mA and/or kV according to patient size and/or use of iterative reconstruction technique. COMPARISON:  CTA 08/22/2022 FINDINGS: Lower chest: Mild dependent atelectasis. Trace right pleural effusion.  Coronary artery calcifications. Mild cardiomegaly. Hepatobiliary: Diffuse hepatic steatosis. No evidence of focal liver lesion. Multiple layering stones in the gallbladder. No pericholecystic inflammation. No biliary dilatation. Pancreas: Parenchymal atrophy. No ductal dilatation or inflammation. Spleen: Mild splenomegaly, spleen measures 13.5 cm cranial caudal. Adrenals/Urinary Tract: No adrenal nodule. 8 x 8 mm stone in the distal right ureter with moderate proximal hydroureteronephrosis. Mild right perinephric edema. Punctate nonobstructing stone in the upper pole of the right kidney. Small right renal cyst, needs no further imaging follow-up. Intrarenal stones in the left kidney without left hydronephrosis. No left ureteral calculi. The urinary bladder is partially distended, no bladder stone. Stomach/Bowel: Minimal hiatal hernia. The stomach is nondistended there is no bowel obstruction or inflammation. Moderate volume of colonic stool. Sigmoid colonic redundancy. There is chronic fullness of the proximal appendix at 7 mm which tapers distally, no appendicitis. 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 The aneurysm sac measures 6.6 x 4.7 cm, previously 6.4 x 4.5 cm. Embolization coils within the aneurysm sac. There is no periaortic stranding. Coiling of the right internal  iliac artery. 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 No abdominopelvic adenopathy. 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 Other: No ascites or free air. Small fat containing umbilical hernia. Musculoskeletal: Posterior rod with intrapedicular screws L2-L3. The right L3 pedicle screw extends lateral to the vertebral body cortex. Additional degenerative change in the lower lumbar spine. No acute osseous abnormalities. IMPRESSION: 1. Obstructing 8 x 8 mm stone in the distal right ureter with moderate proximal hydroureteronephrosis. 2. Additional nonobstructing stones in both kidneys.  3. 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 The aneurysm sac measures 6.6 x 4.7 cm, previously 6.4 x 4.5 cm. Recommend annual imaging follow-up by CTA or MRA. 4. Hepatic steatosis.  Mild splenomegaly. 5. Cholelithiasis without CT findings of acute cholecystitis. 6. Trace right pleural effusion. Aortic Atherosclerosis (ICD10-I70.0). Electronically Signed   By: Narda Rutherford M.D.   On: 04/24/2023 19:14   DG Chest 2 View  Result Date: 04/24/2023 CLINICAL DATA:  Shortness of breath. EXAM: CHEST - 2 VIEW COMPARISON:  August 09, 2022 FINDINGS: Multiple sternal wires and vascular clips are seen. The heart size and mediastinal contours are within normal limits. There is mild calcification of the aortic arch. Both lungs are clear. The visualized skeletal structures are unremarkable. IMPRESSION: 1. Evidence of prior median sternotomy/CABG. 2. No active cardiopulmonary disease. Electronically Signed   By: Aram Candela M.D.   On: 04/24/2023 17:39    Microbiology: Results for orders placed or performed during the hospital encounter of 04/26/23  Urine Culture (for pregnant, neutropenic or urologic patients or patients with an indwelling urinary catheter)     Status: None   Collection Time: 04/27/23  9:07 AM   Specimen: Urine, Catheterized  Result Value Ref Range Status   Specimen Description   Final    URINE, CATHETERIZED Performed at [facility], [address], [place], [place] [address]    Special Requests   Final    NONE Performed at [facility], [address] [place], [place] [address]    Culture   Final    NO GROWTH Performed at [facility], [address], [place], [place] [address]    Report Status 04/28/2023 FINAL  Final  C Difficile Quick Screen w PCR reflex     Status: None   Collection Time: 04/27/23  9:55 AM   Specimen: STOOL  Result Value Ref Range Status   C Diff antigen NEGATIVE NEGATIVE Final   C Diff toxin  NEGATIVE NEGATIVE Final   C Diff interpretation No C. difficile detected.  Final    Comment: Performed at [facility], [address], [place], [place] [address]    Labs: CBC: Recent Labs  Lab 04/30/23 0358 05/01/23 0410 05/02/23 0428 05/03/23 0422 05/04/23 0424  WBC 4.7 5.9 6.1 6.5 12.0*  NEUTROABS 2.5 3.2 3.7  --   --   HGB 10.7* 11.4* 11.3* 11.4* 11.7*  HCT 32.7* 34.1* 34.2* 34.3* 34.8*  MCV 88.6 88.1 87.9 88.6 88.3  PLT 177 205 240 239 281   Basic Metabolic Panel: Recent Labs  Lab 04/29/23 0430 04/30/23 0358 05/01/23 0410 05/02/23 0428 05/03/23 0422  NA 136 134* 136 136 136  K 3.9 3.4* 3.9 4.1 3.9  CL 104 104 105 106 106  CO2 25 23 24 24 24   GLUCOSE 138* 144* 128* 131* 123*  BUN 21 22 20  26* 22  CREATININE 1.57* 1.36* 1.33* 1.32* 1.31*  CALCIUM 9.3 9.4 9.5 9.6 9.6  MG 1.9 1.8 1.8 1.9  --    Liver Function Tests: No results for input(s): "AST", "ALT", "ALKPHOS", "BILITOT", "PROT", "ALBUMIN" in the last 168 hours. CBG: Recent Labs  Lab 05/03/23 1304 05/03/23 1624 05/03/23 2054 05/04/23 0754 05/04/23 1153  GLUCAP 221* 160* 263* 153* 262*    Discharge time spent: greater than 30 minutes.  Signed: Meredeth Ide, MD [facility] 05/04/2023

## 2023-05-04 NOTE — Plan of Care (Signed)

## 2023-05-04 NOTE — Progress Notes (Signed)
1 Day Post-Op Subjective: Patient reports some dysuria and slow stream.  He has not been on his Flomax.  No clots.  Objective: Vital signs in last 24 hours: Temp:  [95.4 F (35.2 C)-98.1 F (36.7 C)] 98.1 F (36.7 C) (06/21 0359) Pulse Rate:  [64-75] 66 (06/21 0359) Resp:  [9-20] 18 (06/21 0359) BP: (116-150)/(69-95) 116/73 (06/21 0359) SpO2:  [97 %-100 %] 97 % (06/21 0359)  Intake/Output from previous day: 06/20 0701 - 06/21 0700 In: 1303 [P.O.:600; I.V.:703] Out: 1120 [Urine:1120] Intake/Output this shift: No intake/output data recorded.  Physical Exam:  Constitutional: Vital signs reviewed. WD WN in NAD   Eyes: PERRL, No scleral icterus.   Cardiovascular: RRR Pulmonary/Chest: Normal effort  Urine darker brown.  No clots.  Lab Results: Recent Labs    05/02/23 0428 05/03/23 0422 05/04/23 0424  HGB 11.3* 11.4* 11.7*  HCT 34.2* 34.3* 34.8*   BMET Recent Labs    05/02/23 0428 05/03/23 0422  NA 136 136  K 4.1 3.9  CL 106 106  CO2 24 24  GLUCOSE 131* 123*  BUN 26* 22  CREATININE 1.32* 1.31*  CALCIUM 9.6 9.6   No results for input(s): "LABPT", "INR" in the last 72 hours. No results for input(s): "LABURIN" in the last 72 hours. Results for orders placed or performed during the hospital encounter of 04/26/23  Urine Culture (for pregnant, neutropenic or urologic patients or patients with an indwelling urinary catheter)     Status: None   Collection Time: 04/27/23  9:07 AM   Specimen: Urine, Catheterized  Result Value Ref Range Status   Specimen Description   Final    URINE, CATHETERIZED Performed at [facility], [address] [place], [place] [address]    Special Requests   Final    NONE Performed at [facility], [address] [place], [place] [address]    Culture   Final    NO GROWTH Performed at [facility], [address] [place], [place] [address]    Report Status 04/28/2023 FINAL  Final  C  Difficile Quick Screen w PCR reflex     Status: None   Collection Time: 04/27/23  9:55 AM   Specimen: STOOL  Result Value Ref Range Status   C Diff antigen NEGATIVE NEGATIVE Final   C Diff toxin NEGATIVE NEGATIVE Final   C Diff interpretation No C. difficile detected.  Final    Comment: Performed at [facility], [address], [place], [place] [address]    Studies/Results: DG C-Arm 1-60 Min-No Report  Result Date: 05/03/2023 Fluoroscopy was utilized by the requesting physician.  No radiographic interpretation.    Assessment/Plan:  Right ureteral stone with UTI/pyelonephrosis, stented.  Treated appropriately with antibiotics.  Subsequent gross hematuria.  Postop day 1 right ureteroscopy, stone extraction, fulguration.  Slow stream, but has not been on his Flomax.  I am fine with him going home.  We will give him a morning dose of Flomax.   LOS: 6 days   Chelsea Aus 05/04/2023, 7:53 AM

## 2023-05-04 NOTE — Care Management Important Message (Signed)
Important Message  Patient Details IM Letter given. Name: KEEDAN SAMPLE Sr. MRN: [id number] Date of Birth: 1941/10/03   Medicare Important Message Given:  Yes     Caren Macadam 05/04/2023, 11:42 AM

## 2023-05-04 NOTE — Progress Notes (Signed)
   Cardiologist:  Caro Hight  Subjective:  Post cystoscopy with laser and stent retrieval urine still dark but improved  Objective:  Vitals:   05/03/23 1449 05/03/23 1551 05/03/23 1951 05/04/23 0359  BP: 131/69 119/84 136/82 116/73  Pulse: 66 68 71 66  Resp: 16 14  18   Temp: (!) 97.5 F (36.4 C) (!) 97.5 F (36.4 C) 98.1 F (36.7 C) 98.1 F (36.7 C)  TempSrc: Axillary Oral Oral Oral  SpO2: 98% 97% 97% 97%  Weight:      Height:        Intake/Output from previous day:  Intake/Output Summary (Last 24 hours) at 05/04/2023 0930 Last data filed at 05/04/2023 0500 Gross per 24 hour  Intake 703 ml  Output 1120 ml  Net -417 ml    Physical Exam:  Lungs clear No murmur prior sternotomy  Abdomen benign Foley with clearing hematuria No edema Palpable pedal pulses   Lab Results: Basic Metabolic Panel: Recent Labs    05/02/23 0428 05/03/23 0422  NA 136 136  K 4.1 3.9  CL 106 106  CO2 24 24  GLUCOSE 131* 123*  BUN 26* 22  CREATININE 1.32* 1.31*  CALCIUM 9.6 9.6  MG 1.9  --    Liver Function Tests: No results for input(s): "AST", "ALT", "ALKPHOS", "BILITOT", "PROT", "ALBUMIN" in the last 72 hours. No results for input(s): "LIPASE", "AMYLASE" in the last 72 hours. CBC: Recent Labs    05/02/23 0428 05/03/23 0422 05/04/23 0424  WBC 6.1 6.5 12.0*  NEUTROABS 3.7  --   --   HGB 11.3* 11.4* 11.7*  HCT 34.2* 34.3* 34.8*  MCV 87.9 88.6 88.3  PLT 240 239 281    Imaging: DG C-Arm 1-60 Min-No Report  Result Date: 05/03/2023 Fluoroscopy was utilized by the requesting physician.  No radiographic interpretation.    Cardiac Studies:  ECG: afib RBBB   Telemetry:  afib  Echo: 06/15/22 EF 55-60% moderate bi atrial enlargement  Medications:    amLODipine  10 mg Oral Daily   apixaban  5 mg Oral BID   atorvastatin  40 mg Oral Daily   carvedilol  6.25 mg Oral BID   cephALEXin  500 mg Oral Q6H   clonazePAM  0.5 mg Oral QHS   insulin aspart  0-15 Units Subcutaneous TID WC    insulin aspart  0-5 Units Subcutaneous QHS   irbesartan  300 mg Oral Daily   isosorbide mononitrate  30 mg Oral Daily   levothyroxine  175 mcg Oral Q0600   pantoprazole  40 mg Oral QHS   sodium chloride flush  3 mL Intravenous Q12H   tamsulosin  0.4 mg Oral Daily        Assessment/Plan:   Afib: CHADVASC 5 rates are fine on coreg.  Back on eliquis  CABG:  2010 no angina stable AAA post EVAR f/u [facility]  CKD:  Cr stable 1.3 outpatient f/u urology d/c today  He knows to stop eliquis should gross hematuria return but Operative note was promising  Cardiology will sign off   Charlton Haws 05/04/2023, 9:30 AM

## 2023-05-11 ENCOUNTER — Encounter (HOSPITAL_COMMUNITY): Payer: Self-pay | Admitting: Urology

## 2023-05-16 DIAGNOSIS — R310 Gross hematuria: Secondary | ICD-10-CM | POA: Diagnosis not present

## 2023-05-16 DIAGNOSIS — N201 Calculus of ureter: Secondary | ICD-10-CM | POA: Diagnosis not present

## 2023-05-29 ENCOUNTER — Other Ambulatory Visit: Payer: Self-pay | Admitting: Gastroenterology

## 2023-05-29 ENCOUNTER — Telehealth: Payer: Self-pay | Admitting: *Deleted

## 2023-05-29 NOTE — Telephone Encounter (Signed)
   Pre-operative Risk Assessment    Patient Name: Garrett ERRINGTON Sr.  DOB: 05-Oct-1941 MRN: [id number]      Request for Surgical Clearance    Procedure:   Colonoscopy  Date of Surgery:  Clearance 06/28/23                                 Surgeon:  Dr. Bosie Clos Surgeon's Group or Practice Name:  [facility] Phone number:  [phone] Fax number:  [phone]   Type of Clearance Requested:   - Medical  - Pharmacy:  Hold Apixaban (Eliquis) Not Indicated   Type of Anesthesia:   Propofol   Additional requests/questions:    Signed, Emmit Pomfret   05/29/2023, 4:01 PM

## 2023-05-30 NOTE — Telephone Encounter (Signed)
I will update the requesting office to see notes from pre op APP.

## 2023-05-30 NOTE — Telephone Encounter (Signed)
Preop team patient was admitted last month and cardiology consulted. He has follow up appointment with Dr. Anne Fu. Please add preoperative cardiac evaluation to appointment note and contact requesting office to let them know he will need to postpone colonoscopy until after his follow up appointment.

## 2023-05-30 NOTE — Telephone Encounter (Signed)
Patient with diagnosis of afib on Eliquis for anticoagulation.    Procedure: Colonoscopy  Date of procedure: 06/28/2023   CHA2DS2-VASc Score = 5   This indicates a 7.2% annual risk of stroke. The patient's score is based upon: CHF History: 0 HTN History: 1 Diabetes History: 1 Stroke History: 0 Vascular Disease History: 1 Age Score: 2 Gender Score: 0  CrCl 48 mL/min Platelet count 281 K    Per office protocol, patient can hold Eliquis for 1 days prior to procedure.     **This guidance is not considered finalized until pre-operative APP has relayed final recommendations.**

## 2023-06-08 ENCOUNTER — Other Ambulatory Visit: Payer: Self-pay | Admitting: Internal Medicine

## 2023-06-08 ENCOUNTER — Telehealth: Payer: Self-pay | Admitting: *Deleted

## 2023-06-08 ENCOUNTER — Encounter: Payer: Self-pay | Admitting: Cardiology

## 2023-06-08 NOTE — Telephone Encounter (Signed)
Pt has been scheduled for tele pre op appt 06/20/23 @ 2pm. Med rec and consent are done.     Patient Consent for Virtual Visit        Garrett SARAZIN Sr. has provided verbal consent on 06/08/2023 for a virtual visit (video or telephone).   CONSENT FOR VIRTUAL VISIT FOR:  Garrett Graves Sr.  By participating in this virtual visit I agree to the following:  I hereby voluntarily request, consent and authorize [facility] and its employed or contracted physicians, physician assistants, nurse practitioners or other licensed health care professionals (the Practitioner), to provide me with telemedicine health care services (the "Services") as deemed necessary by the treating Practitioner. I acknowledge and consent to receive the Services by the Practitioner via telemedicine. I understand that the telemedicine visit will involve communicating with the Practitioner through live audiovisual communication technology and the disclosure of certain medical information by electronic transmission. I acknowledge that I have been given the opportunity to request an in-person assessment or other available alternative prior to the telemedicine visit and am voluntarily participating in the telemedicine visit.  I understand that I have the right to withhold or withdraw my consent to the use of telemedicine in the course of my care at any time, without affecting my right to future care or treatment, and that the Practitioner or I may terminate the telemedicine visit at any time. I understand that I have the right to inspect all information obtained and/or recorded in the course of the telemedicine visit and may receive copies of available information for a reasonable fee.  I understand that some of the potential risks of receiving the Services via telemedicine include:  Delay or interruption in medical evaluation due to technological equipment failure or disruption; Information transmitted may not be sufficient  (e.g. poor resolution of images) to allow for appropriate medical decision making by the Practitioner; and/or  In rare instances, security protocols could fail, causing a breach of personal health information.  Furthermore, I acknowledge that it is my responsibility to provide information about my medical history, conditions and care that is complete and accurate to the best of my ability. I acknowledge that Practitioner's advice, recommendations, and/or decision may be based on factors not within their control, such as incomplete or inaccurate data provided by me or distortions of diagnostic images or specimens that may result from electronic transmissions. I understand that the practice of medicine is not an exact science and that Practitioner makes no warranties or guarantees regarding treatment outcomes. I acknowledge that a copy of this consent can be made available to me via my patient portal [facility] MyChart), or I can request a printed copy by calling the office of [facility].    I understand that my insurance will be billed for this visit.   I have read or had this consent read to me. I understand the contents of this consent, which adequately explains the benefits and risks of the Services being provided via telemedicine.  I have been provided ample opportunity to ask questions regarding this consent and the Services and have had my questions answered to my satisfaction. I give my informed consent for the services to be provided through the use of telemedicine in my medical care

## 2023-06-08 NOTE — Telephone Encounter (Addendum)
Primary Cardiologist:Mark Anne Fu, MD   Preoperative team, please contact this patient and set up a phone call appointment per patient's request (has Sept appt with Dr. Anne Fu, would like to have this procedure prior to that date) for further preoperative risk assessment. Please obtain consent and complete medication review. Thank you for your help.   I confirm that guidance regarding oral anticoagulation therapy has been completed and, if necessary, noted below:  He may hold Eliquis for 1 day prior to procedure. Additionally, he is taking Plavix. Per office protocol and pending no symptoms of ACS at time of virtual visit, he may hold Plavix  for 5 days prior to procedure and should resume as soon as hemodynamically stable postoperatively.    Levi Aland, NP-C  06/08/2023, 11:30 AM [address] Office [phone] Fax [phone]

## 2023-06-08 NOTE — Telephone Encounter (Signed)
Left message to call back to schedule a tele pre op appt. Our first available is 06/20/23.

## 2023-06-08 NOTE — Telephone Encounter (Signed)
Pt has been scheduled for tele pre op appt 06/20/23 @ 2pm. Med rec and consent are done.

## 2023-06-15 ENCOUNTER — Encounter (INDEPENDENT_AMBULATORY_CARE_PROVIDER_SITE_OTHER): Payer: Medicare Other | Admitting: Cardiology

## 2023-06-15 DIAGNOSIS — I480 Paroxysmal atrial fibrillation: Secondary | ICD-10-CM | POA: Diagnosis not present

## 2023-06-19 NOTE — Telephone Encounter (Signed)
Please see the MyChart message reply(ies) for my assessment and plan.   I am sorry to hear this about the Eliquis.  This is certainly a common problem amongst Medicare patients.  Hopefully patient assistance programs will come through for you.  In the meantime, I would like to refer you to our structural heart clinic to discuss Watchman device.  This would allow you to come off of anticoagulation and be protected against stroke.  This patient gave consent for this Medical Advice Message and is aware that it may result in a bill to [employer], as well as the possibility of receiving a bill for a co-payment or deductible. They are an established patient, but are not seeking medical advice exclusively about a problem treated during an in person or video visit in the last seven days. I did not recommend an in person or video visit within seven days of my reply.    I spent a total of 5 minutes cumulative time within 7 days through [employer].  Donato Schultz, MD

## 2023-06-19 NOTE — Progress Notes (Unsigned)
Virtual Visit via Telephone Note   Because of Garrett MOSSBARGER Sr.'s co-morbid illnesses, he is at least at moderate risk for complications without adequate follow up.  This format is felt to be most appropriate for this patient at this time.  The patient did not have access to video technology/had technical difficulties with video requiring transitioning to audio format only (telephone).  All issues noted in this document were discussed and addressed.  No physical exam could be performed with this format.  Please refer to the patient's chart for his consent to telehealth for [facility].  Evaluation Performed:  Preoperative cardiovascular risk assessment _____________   Date:  06/20/2023   Patient ID:  Garrett Graves Sr., DOB 06/22/1941, MRN [id number] Patient Location:  Home Provider location:   Office  Primary Care Provider:  [name], MD Primary Cardiologist:  Garrett Schultz, MD  Chief Complaint / Patient Profile   82 y.o. 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      He is pending colonoscopy with [facility], Dr. Bosie Jordan, on 06/28/2023,  and presents today for telephonic preoperative cardiovascular risk assessment along with recommendations on Eliquis and Plavix.   History of Present Illness    Garrett GIANNINI Sr. is a 82 y.o. male who presents via audio/video conferencing for a telehealth visit today.  Pt was last seen in cardiology clinic on 02/21/2023 by Garrett Bridegroom, NP.  At that time Garrett GILPIN Sr. was doing well. He was hospitalized with GI bleed from a kidney stone in 04/2023.  Both Plavix and Eliquis were resumed on discharge.  The patient is now pending procedure as outlined above. Since his last visit, he has done very well. He walks his  dog about 2 miles each day, and sometimes goes to the [facility].  He denies any cardiac symptoms of chest pain, DOE, dizziness or fatigue. He is medically compliant and denies any further bleeding issues since discharge from the hospital in June 2024.   Past Medical History    Past Medical History:  Diagnosis Date   AAA (abdominal aortic aneurysm) (HCC)    Arthritis    knees   Atrial fibrillation (HCC)    CAD (coronary artery disease)    Cancer (HCC)    prostate   CKD (chronic kidney disease), stage III (HCC) 06/18/2022   COVID    06/2019 and in 2022 states they were mild cases   Diabetes mellitus without complication (HCC)    Dyspnea    once in a while   Dysrhythmia    A-fib pt is on Eliquis   GERD (gastroesophageal reflux disease)    Gout    History of kidney stones    Hyperlipidemia    Hypertension    Hypothyroidism    Myocardial infarction (HCC) 01/1991   sp inferior   Nerve compression    right leg   Reflux    Seasonal allergies    Thoracic ascending aortic aneurysm (HCC)    Thyroid disease    hypothyroidism   Past Surgical History:  Procedure Laterality Date   ABDOMINAL AORTAGRAM N/A 02/28/2012   Procedure: ABDOMINAL Garrett Jordan;  Surgeon: Garrett Libman, MD;  Location: [facility] CATH LAB;  Service: Cardiovascular;  Laterality: N/A;   abdominal aortagram embolization  02/28/2012  ABDOMINAL AORTIC ANEURYSM REPAIR  07/14/2012   CARDIAC CATHETERIZATION     CATARACT EXTRACTION Bilateral 2016   CORONARY ARTERY BYPASS GRAFT  04/13/2009   CORONARY BALLOON ANGIOPLASTY N/A 06/15/2022   Procedure: CORONARY BALLOON ANGIOPLASTY;  Surgeon: Garrett Bollman, MD;  Location: [facility] INVASIVE CV LAB;  Service: Cardiovascular;  Laterality: N/A;   CORONARY STENT PLACEMENT  1992 and  2009   RCA   CYSTOSCOPY W/ URETERAL STENT PLACEMENT Right 04/24/2023   Procedure: CYSTOSCOPY WITH RETROGRADE PYELOGRAM/URETERAL STENT PLACEMENT;  Surgeon: Crist Fat, MD;  Location: [facility] OR;  Service: Urology;   Laterality: Right;   CYSTOSCOPY/URETEROSCOPY/HOLMIUM LASER/STENT PLACEMENT Right 05/03/2023   Procedure: CYSTOSCOPY/URETEROSCOPY/HOLMIUM LASER/STONE EXTRACTION/STENT REMOVAL, FULGURATION;  Surgeon: Garrett Matar, MD;  Location: [facility] ORS;  Service: Urology;  Laterality: Right;  1 HR   EMBOLIZATION Right 02/28/2012   Procedure: EMBOLIZATION;  Surgeon: Garrett Libman, MD;  Location: [facility] CATH LAB;  Service: Cardiovascular;  Laterality: Right;   IR GENERIC HISTORICAL  12/05/2016   IR RADIOLOGIST EVAL & MGMT 12/05/2016 Garrett Moan, MD [facility] INTERV RAD   IR RADIOLOGIST EVAL & MGMT  07/10/2019   IR RADIOLOGIST EVAL & MGMT  08/25/2020   IR RADIOLOGIST EVAL & MGMT  08/16/2021   IR RADIOLOGIST EVAL & MGMT  08/31/2022   KIDNEY STONE SURGERY     LAMINECTOMY WITH POSTERIOR LATERAL ARTHRODESIS LEVEL 1 Bilateral 11/10/2022   Procedure: Lumbar Two-Lumbar Three laminectomy  - bilateral with posterior lateral fusion and interspinous plating;  Surgeon: Garrett Alert, MD;  Location: [facility] OR;  Service: Neurosurgery;  Laterality: Bilateral;   LEFT HEART CATH AND CORONARY ANGIOGRAPHY N/A 06/15/2022   Procedure: LEFT HEART CATH AND CORONARY ANGIOGRAPHY;  Surgeon: Garrett Bollman, MD;  Location: [facility] INVASIVE CV LAB;  Service: Cardiovascular;  Laterality: N/A;   LEFT HEART CATHETERIZATION WITH CORONARY/GRAFT ANGIOGRAM N/A 01/08/2015   Procedure: LEFT HEART CATHETERIZATION WITH Garrett Jordan;  Surgeon: Garrett Boyer, MD;  Location: [facility] CATH LAB;  Service: Cardiovascular;  Laterality: N/A;   parathyroid adenoma  1981   surgery   PROSTATE SURGERY     rad seeds   SPINE SURGERY  2006   TONSILLECTOMY  1955    Allergies  Allergies  Allergen Reactions   Quinolones Other (See Comments)    Aortic root enlargement   Ace Inhibitors Cough   Crestor [Rosuvastatin] Other (See Comments)    Muscle aches   Nitroglycerin Other (See Comments)    Very pronounced lowered B/P with IV nitro for heart caths     Home Medications    Prior to Admission medications   Medication Sig Start Date End Date Taking? Authorizing Provider  acetaminophen (TYLENOL) 650 MG CR tablet Take 1,950 mg by mouth 2 (two) times daily as needed for pain.    [provider]  allopurinol (ZYLOPRIM) 300 MG tablet Take 300 mg by mouth See admin instructions. Take 300 mg by mouth every other evening    [provider]  amLODipine (NORVASC) 10 MG tablet Take 1 tablet (10 mg total) by mouth daily. Patient taking differently: Take 10 mg by mouth every evening. 12/15/22   Jake Bathe, MD  apixaban (ELIQUIS) 5 MG TABS tablet Take 1 tablet (5 mg total) by mouth 2 (two) times daily. 02/27/23   Jake Bathe, MD  Ascorbic Acid (VITAMIN C) 500 MG CAPS Take 500 mg by mouth every evening.    [provider]  atorvastatin (LIPITOR) 40 MG tablet Take 1 tablet (40 mg total) by  mouth daily. Patient taking differently: Take 40 mg by mouth See admin instructions. Take 40 mg by mouth on Tues/Thurs/Sat 06/18/22   Barrett, Joline Salt, PA-C  carvedilol (COREG) 6.25 MG tablet Take 1 tablet (6.25 mg total) by mouth 2 (two) times daily. 02/21/23   Swinyer, Zachary George, NP  Cholecalciferol (VITAMIN D3) 2000 UNITS TABS Take 2,000 Units by mouth daily.     [provider]  clonazePAM (KLONOPIN) 1 MG tablet Take 0.5 mg by mouth See admin instructions. Take 0.5 mg by mouth at bedtime and an additional 0.5 mg once a day as needed for anxiety    [provider]  clopidogrel (PLAVIX) 75 MG tablet Take 1 tablet (75 mg total) by mouth daily. 01/22/23   Jake Bathe, MD  Coenzyme Q10 300 MG CAPS Take 300 mg by mouth in the morning.    [provider]  dapagliflozin propanediol (FARXIGA) 10 MG TABS tablet Take 1 tablet (10 mg total) by mouth daily before breakfast. 03/22/23   Shamleffer, Konrad Dolores, MD  ezetimibe (ZETIA) 10 MG tablet TAKE 1 TABLET BY MOUTH DAILY 04/17/23   Orbie Pyo, MD  fenofibrate  micronized (LOFIBRA) 200 MG capsule TAKE 1 CAPSULE BY MOUTH DAILY  BEFORE BREAKFAST Patient taking differently: Take 200 mg by mouth daily before breakfast. 12/18/22   Shamleffer, Konrad Dolores, MD  fluorouracil (EFUDEX) 5 % cream Apply 1 Application topically See admin instructions. Apply to affected area of the face 2 times a day AS DIRECTED    [provider]  fluticasone (FLONASE) 50 MCG/ACT nasal spray Place 2 sprays into both nostrils daily as needed for allergies.    [provider]  glipiZIDE (GLUCOTROL) 5 MG tablet Take 0.5 tablets (2.5 mg total) by mouth 2 (two) times daily before a meal. 03/22/23   Shamleffer, Konrad Dolores, MD  glucose blood (ACCU-CHEK GUIDE) test strip USE TO TEST TWICE DAILY 06/11/23   Shamleffer, Konrad Dolores, MD  isosorbide mononitrate (IMDUR) 30 MG 24 hr tablet Take 1 tablet (30 mg total) by mouth daily. 12/06/22   Jake Bathe, MD  levothyroxine (SYNTHROID, LEVOTHROID) 175 MCG tablet Take 175 mcg by mouth daily before breakfast.    [provider]  lidocaine (ASPERCREME LIDOCAINE) 4 % Place 1 patch onto the skin daily as needed (pain). Patient not taking: Reported on 06/08/2023    [provider]  loratadine (CLARITIN) 10 MG tablet Take 10 mg by mouth at bedtime.    [provider]  magnesium oxide (MAG-OX) 400 (240 Mg) MG tablet Take 400 mg by mouth every evening.    [provider]  Multiple Vitamins-Minerals (PRESERVISION AREDS 2) CAPS Take 1 capsule by mouth 2 (two) times daily.    [provider]  nitroGLYCERIN (NITROSTAT) 0.4 MG SL tablet Place 1 tablet (0.4 mg total) under the tongue every 5 (five) minutes as needed for chest pain. 06/17/22   Barrett, Joline Salt, PA-C  olmesartan (BENICAR) 40 MG tablet Take 1 tablet (40 mg total) by mouth daily. Patient taking differently: Take 40 mg by mouth every evening. 03/05/23   Jake Bathe, MD  oxyCODONE 10 MG TABS Take 1 tablet (10 mg total) by mouth every  4 (four) hours as needed for severe pain ((score 7 to 10)). 11/11/22   Arman Bogus, MD  pantoprazole (PROTONIX) 40 MG tablet TAKE 1 TABLET BY MOUTH DAILY Patient taking differently: Take 40 mg by mouth at bedtime. 08/07/22   Jake Bathe, MD  Plant Sterols and Stanols (CHOLESTOFF PLUS PO) Take 1 capsule by mouth daily.    [provider]  Polyethyl Glycol-Propyl Glycol (SYSTANE OP) Place 1 drop into both eyes daily as needed (dry/irritated eyes).    [provider]  tamsulosin (FLOMAX) 0.4 MG CAPS capsule Take 1 capsule (0.4 mg total) by mouth daily. 05/05/23   Meredeth Ide, MD  triamcinolone cream (KENALOG) 0.1 % Apply 1 application  topically daily as needed (rash). 02/07/18   [provider]  TURMERIC PO Take 2 tablets by mouth See admin instructions. Chew 2 gummies by mouth two times a week    [provider]  VASCEPA 1 g capsule TAKE 2 CAPSULES BY MOUTH TWICE A DAY 04/06/23   Jake Bathe, MD    Physical Exam    Vital Signs:  Garrett Graves Sr. does not have vital signs available for review today.  Given telephonic nature of communication, physical exam is limited. AAOx3. NAD. Normal affect.  Speech and respirations are unlabored.  Accessory Clinical Findings    None  Assessment & Plan    1.  Preoperative Cardiovascular Risk Assessment:  According to the Revised Cardiac Risk Index (RCRI), his Perioperative Risk of Major Cardiac Event is (%): 0.4  His Functional Capacity in METs is: 9.25 according to the Duke Activity Status Index (DASI).   Per office protocol, patient can hold Eliquis for 1 days prior to procedure and Plavix 5 days prior to the procedure. Please resume Eliquis and Plavix as soon as possible postprocedure, at the discretion of the surgeon.    Therefore, based on ACC/AHA guidelines, patient would be at acceptable risk for the planned procedure without further cardiovascular testing. I will route this recommendation to the  requesting party via Epic fax function.   A copy of this note will be routed to requesting surgeon.  The patient was advised that if he develops new symptoms prior to surgery to contact our office to arrange for a follow-up visit, and he verbalized understanding.  Time:   Today, I have spent 10 minutes with the patient with telehealth technology discussing medical history, symptoms, and management plan.     Joni Reining, NP  06/20/2023, 2:02 PM

## 2023-06-20 ENCOUNTER — Ambulatory Visit: Payer: Medicare Other | Attending: Cardiology

## 2023-06-20 ENCOUNTER — Other Ambulatory Visit: Payer: Self-pay | Admitting: Physician Assistant

## 2023-06-20 DIAGNOSIS — Z01810 Encounter for preprocedural cardiovascular examination: Secondary | ICD-10-CM | POA: Diagnosis not present

## 2023-06-26 ENCOUNTER — Other Ambulatory Visit: Payer: Self-pay | Admitting: Gastroenterology

## 2023-06-27 NOTE — Anesthesia Preprocedure Evaluation (Addendum)
Anesthesia Evaluation  Patient identified by MRN, date of birth, ID band Patient awake    Reviewed: Allergy & Precautions, NPO status , Patient's Chart, lab work & pertinent test results  Airway Mallampati: II  TM Distance: >3 FB Neck ROM: Full    Dental no notable dental hx.    Pulmonary former smoker   Pulmonary exam normal        Cardiovascular hypertension, Pt. on medications and Pt. on home beta blockers + CAD and + Past MI  + dysrhythmias Atrial Fibrillation  Rhythm:Regular Rate:Normal     Neuro/Psych negative neurological ROS  negative psych ROS   GI/Hepatic Neg liver ROS,GERD  Medicated,,  Endo/Other  diabetes, Type 2Hypothyroidism    Renal/GU CRFRenal disease  negative genitourinary   Musculoskeletal  (+) Arthritis , Osteoarthritis,    Abdominal Normal abdominal exam  (+)   Peds  Hematology Lab Results      Component                Value               Date                      WBC                      12.0 (H)            05/04/2023                HGB                      11.7 (L)            05/04/2023                HCT                      34.8 (L)            05/04/2023                MCV                      88.3                05/04/2023                PLT                      281                 05/04/2023              Anesthesia Other Findings   Reproductive/Obstetrics                             Anesthesia Physical Anesthesia Plan  ASA: 3  Anesthesia Plan: MAC   Post-op Pain Management:    Induction: Intravenous  PONV Risk Score and Plan: Propofol infusion and Treatment may vary due to age or medical condition  Airway Management Planned: Simple Face Mask and Nasal Cannula  Additional Equipment: None  Intra-op Plan:   Post-operative Plan:   Informed Consent: I have reviewed the patients History and Physical, chart, labs and discussed the procedure including  the risks, benefits and alternatives for the proposed anesthesia with the patient or authorized  representative who has indicated his/her understanding and acceptance.     Dental advisory given  Plan Discussed with: CRNA  Anesthesia Plan Comments:        Anesthesia Quick Evaluation

## 2023-06-28 ENCOUNTER — Other Ambulatory Visit: Payer: Self-pay

## 2023-06-28 ENCOUNTER — Ambulatory Visit (HOSPITAL_COMMUNITY): Payer: Medicare Other | Admitting: Anesthesiology

## 2023-06-28 ENCOUNTER — Ambulatory Visit (HOSPITAL_BASED_OUTPATIENT_CLINIC_OR_DEPARTMENT_OTHER): Payer: Medicare Other | Admitting: Anesthesiology

## 2023-06-28 ENCOUNTER — Ambulatory Visit (HOSPITAL_COMMUNITY)
Admission: RE | Admit: 2023-06-28 | Discharge: 2023-06-28 | Disposition: A | Discharge status: Home / Self Care | Payer: Medicare Other | Attending: Gastroenterology | Admitting: Gastroenterology

## 2023-06-28 ENCOUNTER — Encounter (HOSPITAL_COMMUNITY)
Admission: RE | Disposition: A | Discharge status: Home / Self Care | Payer: Self-pay | Source: Home / Self Care | Attending: Gastroenterology

## 2023-06-28 DIAGNOSIS — I10 Essential (primary) hypertension: Secondary | ICD-10-CM

## 2023-06-28 DIAGNOSIS — E039 Hypothyroidism, unspecified: Secondary | ICD-10-CM | POA: Diagnosis not present

## 2023-06-28 DIAGNOSIS — K219 Gastro-esophageal reflux disease without esophagitis: Secondary | ICD-10-CM | POA: Insufficient documentation

## 2023-06-28 DIAGNOSIS — D120 Benign neoplasm of cecum: Secondary | ICD-10-CM | POA: Insufficient documentation

## 2023-06-28 DIAGNOSIS — M1990 Unspecified osteoarthritis, unspecified site: Secondary | ICD-10-CM | POA: Insufficient documentation

## 2023-06-28 DIAGNOSIS — K640 First degree hemorrhoids: Secondary | ICD-10-CM | POA: Diagnosis not present

## 2023-06-28 DIAGNOSIS — K625 Hemorrhage of anus and rectum: Secondary | ICD-10-CM | POA: Diagnosis not present

## 2023-06-28 DIAGNOSIS — I2510 Atherosclerotic heart disease of native coronary artery without angina pectoris: Secondary | ICD-10-CM | POA: Diagnosis not present

## 2023-06-28 DIAGNOSIS — Z860100 Personal history of colon polyps, unspecified: Secondary | ICD-10-CM

## 2023-06-28 DIAGNOSIS — E119 Type 2 diabetes mellitus without complications: Secondary | ICD-10-CM | POA: Diagnosis not present

## 2023-06-28 DIAGNOSIS — K529 Noninfective gastroenteritis and colitis, unspecified: Secondary | ICD-10-CM | POA: Insufficient documentation

## 2023-06-28 DIAGNOSIS — I4891 Unspecified atrial fibrillation: Secondary | ICD-10-CM | POA: Diagnosis not present

## 2023-06-28 DIAGNOSIS — K6389 Other specified diseases of intestine: Secondary | ICD-10-CM | POA: Diagnosis not present

## 2023-06-28 DIAGNOSIS — D124 Benign neoplasm of descending colon: Secondary | ICD-10-CM | POA: Insufficient documentation

## 2023-06-28 DIAGNOSIS — D122 Benign neoplasm of ascending colon: Secondary | ICD-10-CM | POA: Diagnosis not present

## 2023-06-28 DIAGNOSIS — I252 Old myocardial infarction: Secondary | ICD-10-CM | POA: Insufficient documentation

## 2023-06-28 DIAGNOSIS — K921 Melena: Secondary | ICD-10-CM | POA: Insufficient documentation

## 2023-06-28 DIAGNOSIS — K635 Polyp of colon: Secondary | ICD-10-CM | POA: Diagnosis not present

## 2023-06-28 DIAGNOSIS — D123 Benign neoplasm of transverse colon: Secondary | ICD-10-CM | POA: Diagnosis not present

## 2023-06-28 DIAGNOSIS — Z87891 Personal history of nicotine dependence: Secondary | ICD-10-CM | POA: Diagnosis not present

## 2023-06-28 DIAGNOSIS — E785 Hyperlipidemia, unspecified: Secondary | ICD-10-CM | POA: Diagnosis not present

## 2023-06-28 DIAGNOSIS — Z79899 Other long term (current) drug therapy: Secondary | ICD-10-CM | POA: Diagnosis not present

## 2023-06-28 HISTORY — PX: COLONOSCOPY WITH PROPOFOL: SHX5780

## 2023-06-28 HISTORY — PX: BIOPSY: SHX5522

## 2023-06-28 HISTORY — PX: POLYPECTOMY: SHX5525

## 2023-06-28 LAB — GLUCOSE, CAPILLARY: Glucose-Capillary: 110 mg/dL — ABNORMAL HIGH (ref 70–99)

## 2023-06-28 SURGERY — COLONOSCOPY WITH PROPOFOL
Anesthesia: Monitor Anesthesia Care

## 2023-06-28 MED ORDER — PROPOFOL 500 MG/50ML IV EMUL
INTRAVENOUS | Status: DC | PRN
  Administered 2023-06-28: 70 ug/kg/min via INTRAVENOUS

## 2023-06-28 MED ORDER — PROPOFOL 10 MG/ML IV BOLUS
INTRAVENOUS | Status: DC | PRN
  Administered 2023-06-28: 100 mg via INTRAVENOUS

## 2023-06-28 MED ORDER — LACTATED RINGERS IV SOLN: INTRAVENOUS | Status: DC

## 2023-06-28 MED ORDER — SODIUM CHLORIDE 0.9 % IV SOLN: INTRAVENOUS | Status: DC

## 2023-06-28 SURGICAL SUPPLY — 22 items

## 2023-06-28 NOTE — Transfer of Care (Signed)
Immediate Anesthesia Transfer of Care Note  Patient: Garrett Jordan.  Procedure(s) Performed: COLONOSCOPY WITH PROPOFOL BIOPSY POLYPECTOMY  Patient Location: Endoscopy Unit  Anesthesia Type:MAC  Level of Consciousness: awake, patient cooperative, and responds to stimulation  Airway & Oxygen Therapy: Patient Spontanous Breathing and Patient connected to nasal cannula oxygen  Post-op Assessment: Report given to RN, Post -op Vital signs reviewed and stable, and Patient moving all extremities X 4  Post vital signs: Reviewed and stable  Last Vitals:  Vitals Value Taken Time  BP 132/77 06/28/23 0825  Temp 36.1 C 06/28/23 0805  Pulse 61 06/28/23 0832  Resp 15 06/28/23 0833  SpO2 96 % 06/28/23 0832  Vitals shown include unfiled device data.  Last Pain:  Vitals:   06/28/23 0825  TempSrc:   PainSc: 0-No pain         Complications: No notable events documented.

## 2023-06-28 NOTE — Discharge Instructions (Addendum)
YOU HAD AN ENDOSCOPIC PROCEDURE TODAY: Refer to the procedure report and other information in the discharge instructions given to you for any specific questions about what was found during the examination. If this information does not answer your questions, please call [facility] office at [phone] to clarify.   YOU SHOULD EXPECT: Some feelings of bloating in the abdomen. Passage of more gas than usual. Walking can help get rid of the air that was put into your GI tract during the procedure and reduce the bloating. If you had a lower endoscopy (such as a colonoscopy or flexible sigmoidoscopy) you may notice spotting of blood in your stool or on the toilet paper. Some abdominal soreness may be present for a day or two, also.  DIET: Your first meal following the procedure should be a light meal and then it is ok to progress to your normal diet. A half-sandwich or bowl of soup is an example of a good first meal. Heavy or fried foods are harder to digest and may make you feel nauseous or bloated. Drink plenty of fluids but you should avoid alcoholic beverages for 24 hours. If you had a esophageal dilation, please see attached instructions for diet.    ACTIVITY: Your care partner should take you home directly after the procedure. You should plan to take it easy, moving slowly for the rest of the day. You can resume normal activity the day after the procedure however YOU SHOULD NOT DRIVE, use power tools, machinery or perform tasks that involve climbing or major physical exertion for 24 hours (because of the sedation medicines used during the test).   SYMPTOMS TO REPORT IMMEDIATELY: A gastroenterologist can be reached at any hour. Please call [phone]  for any of the following symptoms:  Following lower endoscopy (colonoscopy, flexible sigmoidoscopy) Excessive amounts of blood in the stool  Significant tenderness, worsening of abdominal pains  Swelling of the abdomen that is new, acute  Fever of 100  or higher    FOLLOW UP:  If any biopsies were taken you will be contacted by phone or by letter within the next 1-3 weeks. Call [phone]  if you have not heard about the biopsies in 3 weeks.  Please also call with any specific questions about appointments or follow up tests.   HOLD ELIQUIS AND PLAVIX for another 3 days and resume on Sunday July 01, 2023.

## 2023-06-28 NOTE — Interval H&P Note (Signed)
History and Physical Interval Note:  06/28/2023 7:29 AM  Garrett Graves Sr.  has presented today for surgery, with the diagnosis of History of colon polyp:blood in stool.  The various methods of treatment have been discussed with the patient and family. After consideration of risks, benefits and other options for treatment, the patient has consented to  Procedure(s): COLONOSCOPY WITH PROPOFOL (N/A) as a surgical intervention.  The patient's history has been reviewed, patient examined, no change in status, stable for surgery.  I have reviewed the patient's chart and labs.  Questions were answered to the patient's satisfaction.     Shirley Friar

## 2023-06-28 NOTE — H&P (Signed)
Date of Initial H&P: 06/26/23  History reviewed, patient examined, no change in status, stable for surgery.

## 2023-06-28 NOTE — Op Note (Signed)
[facility] Patient Name: Garrett Jordan Procedure Date : 06/28/2023 MRN: [id number] Attending MD: Shirley Friar , MD, [id number] Date of Birth: 09-06-1941 CSN: [id number] Age: 81 Admit Type: Inpatient Procedure:                Colonoscopy Indications:              Rectal bleeding Providers:                Shirley Friar, MD, Carlena Hurl,                            Leanne Lovely, Technician Referring MD:             [name] Medicines:                Propofol per Anesthesia, Monitored Anesthesia Care Complications:            No immediate complications. Estimated Blood Loss:     Estimated blood loss was minimal. Procedure:                Pre-Anesthesia Assessment:                           - Prior to the procedure, a History and Physical                            was performed, and patient medications and                            allergies were reviewed. The patient's tolerance of                            previous anesthesia was also reviewed. The risks                            and benefits of the procedure and the sedation                            options and risks were discussed with the patient.                            All questions were answered, and informed consent                            was obtained. Prior Anticoagulants: The patient                            last took Eliquis (apixaban) 1 day and Plavix                            (clopidogrel) 5 days prior to the procedure. ASA                            Grade Assessment: III - A patient with severe  systemic disease. After reviewing the risks and                            benefits, the patient was deemed in satisfactory                            condition to undergo the procedure.                           After obtaining informed consent, the colonoscope                            was passed under direct vision. Throughout the                             procedure, the patient's blood pressure, pulse, and                            oxygen saturations were monitored continuously. The                            PCF-190TL ([id number]) Olympus colonoscope was                            introduced through the anus and advanced to the the                            cecum, identified by appendiceal orifice and                            ileocecal valve. The colonoscopy was performed                            without difficulty. The patient tolerated the                            procedure well. The quality of the bowel                            preparation was fair and fair but repeated                            irrigation led to a good and adequate prep. The                            terminal ileum, ileocecal valve, appendiceal                            orifice, and rectum were photographed. Scope In: 7:37:33 AM Scope Out: 8:01:46 AM Scope Withdrawal Time: 0 hours 22 minutes 38 seconds  Total Procedure Duration: 0 hours 24 minutes 13 seconds  Findings:      The perianal and digital rectal examinations were normal.      A localized area of mildly nodular mucosa was found in the  cecum and at       the appendiceal orifice. Biopsies were taken with a cold forceps for       histology. Estimated blood loss was minimal.      Two semi-sessile polyps were found in the cecum. The polyps were 2 to 3       mm in size. These polyps were removed with a cold biopsy forceps.       Resection and retrieval were complete. Estimated blood loss was minimal.      A 2 mm polyp was found in the proximal ascending colon. The polyp was       semi-sessile. The polyp was removed with a cold biopsy forceps.       Resection and retrieval were complete. Estimated blood loss was minimal.      Two semi-sessile polyps were found in the transverse colon. The polyps       were 4 to 10 mm in size. These polyps were removed with a hot snare.       Resection and  retrieval were complete. Estimated blood loss: none.      A 4 mm polyp was found in the descending colon. The polyp was sessile.       The polyp was removed with a hot snare. Resection and retrieval were       complete. Estimated blood loss: none.      Two semi-sessile polyps were found in the descending colon. The polyps       were 1 to 3 mm in size. These polyps were removed with a cold biopsy       forceps. Resection and retrieval were complete. Estimated blood loss was       minimal.      Internal hemorrhoids were found during retroflexion. The hemorrhoids       were large and Grade I (internal hemorrhoids that do not prolapse).      The terminal ileum appeared normal. Impression:               - Preparation of the colon was fair.                           - Nodular mucosa in the cecum and at the                            appendiceal orifice. Biopsied.                           - Two 2 to 3 mm polyps in the cecum, removed with a                            cold biopsy forceps. Resected and retrieved.                           - One 2 mm polyp in the proximal ascending colon,                            removed with a cold biopsy forceps. Resected and                            retrieved.                           -  Two 4 to 10 mm polyps in the transverse colon,                            removed with a hot snare. Resected and retrieved.                           - One 4 mm polyp in the descending colon, removed                            with a hot snare. Resected and retrieved.                           - Two 1 to 3 mm polyps in the descending colon,                            removed with a cold biopsy forceps. Resected and                            retrieved.                           - Internal hemorrhoids.                           - The examined portion of the ileum was normal. Recommendation:           - Patient has a contact number available for                             emergencies. The signs and symptoms of potential                            delayed complications were discussed with the                            patient. Return to normal activities tomorrow.                            Written discharge instructions were provided to the                            patient.                           - High fiber diet.                           - Await pathology results.                           - Repeat colonoscopy for surveillance based on                            pathology results.                           -  Resume Eliquis (apixaban) in 3 days and Plavix                            (clopidogrel) in 3 days at prior doses. Procedure Code(s):        --- Professional ---                           [id number], Colonoscopy, flexible; with removal of                            tumor(s), polyp(s), or other lesion(s) by snare                            technique                           45380, 59, Colonoscopy, flexible; with biopsy,                            single or multiple Diagnosis Code(s):        --- Professional ---                           K62.5, Hemorrhage of anus and rectum                           D12.2, Benign neoplasm of ascending colon                           D12.4, Benign neoplasm of descending colon                           D12.0, Benign neoplasm of cecum                           D12.3, Benign neoplasm of transverse colon (hepatic                            flexure or splenic flexure)                           K64.0, First degree hemorrhoids                           K63.89, Other specified diseases of intestine CPT copyright 2022 American Medical Association. All rights reserved. The codes documented in this report are preliminary and upon coder review may  be revised to meet current compliance requirements. Shirley Friar, MD 06/28/2023 8:20:13 AM This report has been signed electronically. Number of Addenda: 0

## 2023-06-29 LAB — SURGICAL PATHOLOGY

## 2023-06-29 NOTE — Anesthesia Postprocedure Evaluation (Signed)
Anesthesia Post Note  Patient: Garrett MALIA Sr.  Procedure(s) Performed: COLONOSCOPY WITH PROPOFOL BIOPSY POLYPECTOMY     Patient location during evaluation: PACU Anesthesia Type: MAC Level of consciousness: awake and alert Pain management: pain level controlled Vital Signs Assessment: post-procedure vital signs reviewed and stable Respiratory status: spontaneous breathing, nonlabored ventilation, respiratory function stable and patient connected to nasal cannula oxygen Cardiovascular status: stable and blood pressure returned to baseline Postop Assessment: no apparent nausea or vomiting Anesthetic complications: no   No notable events documented.  Last Vitals:  Vitals:   06/28/23 0815 06/28/23 0825  BP: 107/65 132/77  Pulse: 63 70  Resp: (!) 21 15  Temp:    SpO2: 95% 95%    Last Pain:  Vitals:   06/28/23 0825  TempSrc:   PainSc: 0-No pain                 Earl Lites P Keighan Amezcua

## 2023-07-02 ENCOUNTER — Encounter (HOSPITAL_COMMUNITY): Payer: Self-pay | Admitting: Gastroenterology

## 2023-07-04 DIAGNOSIS — Z9889 Other specified postprocedural states: Secondary | ICD-10-CM | POA: Diagnosis not present

## 2023-07-04 DIAGNOSIS — N1832 Chronic kidney disease, stage 3b: Secondary | ICD-10-CM | POA: Diagnosis not present

## 2023-07-04 DIAGNOSIS — I129 Hypertensive chronic kidney disease with stage 1 through stage 4 chronic kidney disease, or unspecified chronic kidney disease: Secondary | ICD-10-CM | POA: Diagnosis not present

## 2023-07-04 DIAGNOSIS — N200 Calculus of kidney: Secondary | ICD-10-CM | POA: Diagnosis not present

## 2023-07-04 DIAGNOSIS — Z9089 Acquired absence of other organs: Secondary | ICD-10-CM | POA: Diagnosis not present

## 2023-07-04 DIAGNOSIS — N2581 Secondary hyperparathyroidism of renal origin: Secondary | ICD-10-CM | POA: Diagnosis not present

## 2023-07-04 DIAGNOSIS — D631 Anemia in chronic kidney disease: Secondary | ICD-10-CM | POA: Diagnosis not present

## 2023-07-05 DIAGNOSIS — R310 Gross hematuria: Secondary | ICD-10-CM | POA: Diagnosis not present

## 2023-07-05 DIAGNOSIS — N201 Calculus of ureter: Secondary | ICD-10-CM | POA: Diagnosis not present

## 2023-07-17 DIAGNOSIS — N200 Calculus of kidney: Secondary | ICD-10-CM | POA: Diagnosis not present

## 2023-07-18 NOTE — Progress Notes (Deleted)
Electrophysiology Office Note:    Date:  07/18/2023   ID:  Garrett Graves Sr., DOB 08/01/1941, MRN [id number]  [facility] Cardiologist:  Donato Schultz, MD  [facility] Electrophysiologist:  Lanier Prude, MD   Referring MD: [name], MD   Chief Complaint: Atrial fibrillation and hematuria  History of Present Illness:    Garrett MOXEY Sr. is a 82 y.o. malewho I am seeing today for an evaluation of atrial fibrillation and hematuria at the request of Dr. Anne Fu.  The patient was last seen by Eligha Bridegroom, NP on February 21, 2023.  The patient has a medical history that includes coronary artery disease, ascending aortic aneurysm, CKD, diabetes, atrial fibrillation, prior CABG.  He was admitted April 26, 2023 with hematuria.  He previously been admitted for an obstructing right ureteral stone complicated by sepsis and pyelonephritis.  Urethral stent was required.  His Eliquis and Plavix had to be briefly held until the bleeding stopped.    Their past medical, social and family history was reveiwed.   ROS:   Please see the history of present illness.    All other systems reviewed and are negative.  EKGs/Labs/Other Studies Reviewed:    The following studies were reviewed today:  June 15, 2022 echo EF 55-60 RV mildly reduced Moderately dilated left and right atrium No significant MR Moderate TR Ascending aortic aneurysm        Physical Exam:    VS:  There were no vitals taken for this visit.    Wt Readings from Last 3 Encounters:  06/28/23 185 lb (83.9 kg)  05/02/23 180 lb 12.4 oz (82 kg)  04/26/23 186 lb 1.1 oz (84.4 kg)     GEN: *** Well nourished, well developed in no acute distress CARDIAC: ***RRR, no murmurs, rubs, gallops RESPIRATORY:  Clear to auscultation without rales, wheezing or rhonchi       ASSESSMENT AND PLAN:    1. Paroxysmal A-fib (HCC) [I48.0]   2. Coronary artery disease of native artery of native heart with stable angina  pectoris (HCC)   3. Hematuria, unspecified type     #Atrial fibrillation #Hematuria The patient has atrial fibrillation and a recent hospitalization for hematuria requiring stoppage of his anticoagulation and antiplatelet.  The patient has a history of severe coronary artery disease post CABG and requires long-term antiplatelet therapy.  I discussed the role of left atrial appendage occlusion and stroke risk mitigation.  --------------  I have seen Garrett Graves Sr. in the office today who is being considered for a Watchman left atrial appendage closure device. I believe they will benefit from this procedure given their history of atrial fibrillation, CHA2DS2-VASc score of *** and unadjusted ischemic stroke rate of ***% per year. Unfortunately, the patient is not felt to be a long term anticoagulation candidate secondary to ***. The patient's chart has been reviewed and I feel that they would be a candidate for short term oral anticoagulation after Watchman implant.   It is my belief that after undergoing a LAA closure procedure, Garrett Graves Sr. will not need long term anticoagulation which eliminates anticoagulation side effects and major bleeding risk.   Procedural risks for the Watchman implant have been reviewed with the patient including a 0.5% risk of stroke, <1% risk of perforation and <1% risk of device embolization. Other risks include bleeding, vascular damage, tamponade, worsening renal function, and death. The patient understands these risk and wishes to proceed.     The published  clinical data on the safety and effectiveness of WATCHMAN include but are not limited to the following: - Holmes DR, Everlene Farrier, Sick P et al. for the PROTECT AF Investigators. Percutaneous closure of the left atrial appendage versus warfarin therapy for prevention of stroke in patients with atrial fibrillation: a randomised non-inferiority trial. Lancet 2009; 374: 534-42. Everlene Farrier, Doshi SK, Isa Rankin D et al. on behalf of the PROTECT AF Investigators. Percutaneous Left Atrial Appendage Closure for Stroke Prophylaxis in Patients With Atrial Fibrillation 2.3-Year Follow-up of the PROTECT AF (Watchman Left Atrial Appendage System for Embolic Protection in Patients With Atrial Fibrillation) Trial. Circulation 2013; 127:720-729. - Alli O, Doshi S,  Kar S, Reddy VY, Sievert H et al. Quality of Life Assessment in the Randomized PROTECT AF (Percutaneous Closure of the Left Atrial Appendage Versus Warfarin Therapy for Prevention of Stroke in Patients With Atrial Fibrillation) Trial of Patients at Risk for Stroke With Nonvalvular Atrial Fibrillation. J Am Coll Cardiol 2013; 61:1790-8. Aline August DR, [place]ia [place], Price M, Whisenant B, Sievert H, Doshi S, Huber K, Reddy V. Prospective randomized evaluation of the Watchman left atrial appendage Device in patients with atrial fibrillation versus long-term warfarin therapy; the PREVAIL trial. Journal of the [employer] of Cardiology, Vol. 4, No. 1, 2014, 1-11. - Kar S, Doshi SK, Sadhu A, Horton R, Osorio J et al. Primary outcome evaluation of a next-generation left atrial appendage closure device: results from the PINNACLE FLX trial. Circulation 2021;143(18)1754-1762.    After today's visit with the patient which was dedicated solely for shared decision making visit regarding LAA closure device, the patient decided to proceed with the LAA appendage closure procedure scheduled to be done in the near future at [facility].  He would not need a repeat CT scan given a prior CT chest in October 2023.  We will send this for truplan.   HAS-BLED score 4 Hypertension Yes  Abnormal renal and liver function (Dialysis, transplant, Cr >2.26 mg/dL /Cirrhosis or Bilirubin >2x Normal or AST/ALT/AP >3x Normal) No  Stroke No  Bleeding Yes  Labile INR (Unstable/high INR) No  Elderly (>65) Yes  Drugs or alcohol (? 8 drinks/week, anti-plt or NSAID) Yes    CHA2DS2-VASc Score = 5  The patient's score is based upon: CHF History: 0 HTN History: 1 Diabetes History: 1 Stroke History: 0 Vascular Disease History: 1 Age Score: 2 Gender Score: 0  #Coronary artery disease No ischemic symptoms today. Continue Coreg, Plavix, Imdur.    Signed, Rossie Muskrat. Lalla Brothers, MD, [facility], [facility] 07/18/2023 8:40 PM    Electrophysiology [facility]

## 2023-07-19 ENCOUNTER — Ambulatory Visit: Payer: Medicare Other | Admitting: Cardiology

## 2023-07-19 DIAGNOSIS — I25118 Atherosclerotic heart disease of native coronary artery with other forms of angina pectoris: Secondary | ICD-10-CM

## 2023-07-19 DIAGNOSIS — I480 Paroxysmal atrial fibrillation: Secondary | ICD-10-CM

## 2023-07-19 DIAGNOSIS — R319 Hematuria, unspecified: Secondary | ICD-10-CM

## 2023-07-20 ENCOUNTER — Encounter: Payer: Self-pay | Admitting: Cardiology

## 2023-07-24 ENCOUNTER — Other Ambulatory Visit: Payer: Self-pay | Admitting: Surgery

## 2023-07-24 DIAGNOSIS — I7121 Aneurysm of the ascending aorta, without rupture: Secondary | ICD-10-CM

## 2023-08-10 ENCOUNTER — Ambulatory Visit: Payer: Medicare Other | Attending: Cardiology | Admitting: Cardiology

## 2023-08-10 ENCOUNTER — Telehealth: Payer: Self-pay | Admitting: Cardiology

## 2023-08-10 ENCOUNTER — Encounter: Payer: Self-pay | Admitting: Cardiology

## 2023-08-10 VITALS — BP 134/80 | HR 70 | Ht 69.5 in | Wt 190.4 lb

## 2023-08-10 DIAGNOSIS — I480 Paroxysmal atrial fibrillation: Secondary | ICD-10-CM

## 2023-08-10 DIAGNOSIS — E782 Mixed hyperlipidemia: Secondary | ICD-10-CM

## 2023-08-10 DIAGNOSIS — Z951 Presence of aortocoronary bypass graft: Secondary | ICD-10-CM

## 2023-08-10 DIAGNOSIS — I9789 Other postprocedural complications and disorders of the circulatory system, not elsewhere classified: Secondary | ICD-10-CM | POA: Diagnosis not present

## 2023-08-10 NOTE — Progress Notes (Signed)
Cardiology Office Note:  .   Date:  08/10/2023  ID:  Garrett Graves Sr., DOB 1941-05-05, MRN [id number] PCP: [name], MD  [facility] Providers Cardiologist:  Donato Schultz, MD Electrophysiologist:  Lanier Prude, MD    History of Present Illness: .   Garrett Jordan Sr. is a 82 y.o. male Discussed with the use of AI scribe software   History of Present Illness   The patient, with a history of coronary artery disease status post cardiac catheterization and bypass surgery, presents with intermittent chest discomfort. He describes the sensation as non-pressurized and questions if it could be related to reflux. The discomfort is located in the upper chest area and comes and goes. He also experienced a pulsating pain in the same area, described as sharp and needle-like, which has since resolved. The patient denies any recent physical activity that could have resulted in a muscle pull.  He also reports a lack of energy and a feeling of listlessness, which he suspects could be a side effect of his Lipitor medication. He denies joint pain but reports muscle aches. He has been less active in the past month, but still manages to walk a two-mile loop around a park with his dog two to three times a week and do yard work. However, he notes feeling very tired after these activities and has had days where he feels so listless he spends several hours lying on the couch.  The patient also has a history of atrial fibrillation and is currently on Eliquis and Plavix. He expresses interest in the Watchman procedure to potentially come off Eliquis due to previous complications with bleeding. He also mentions needing dental work and is concerned about the need to discontinue his anticoagulant medications for the procedure. He had a previous hospitalization for a kidney stone, during which he experienced complications related to his anticoagulant medications.         Studies Reviewed: Marland Kitchen   EKG  Interpretation Date/Time:  Friday August 10 2023 09:05:42 EDT Ventricular Rate:  68 PR Interval:    QRS Duration:  154 QT Interval:  428 QTC Calculation: 455 R Axis:   -87  Text Interpretation: Atrial fibrillation Left axis deviation Right bundle branch block Possible Inferior infarct , age undetermined When compared with ECG of 24-Apr-2023 18:51, No significant change since last tracing Confirmed by Donato Schultz ([id number]) on 08/10/2023 9:24:34 AM    LABS LDL: 53  DIAGNOSTIC Cardiac catheterization: Patent LIMA to LAD, SVG to acute marginal, SVG to PDA. Chronic occlusion of the SVG to right PLA branch and acute occlusion of SVG to left posterior branch, posterior lateral branch with unsuccessful PCI. (06/15/2022) Echocardiogram: EF of 55-60%, dilated ascending aorta 44 mm. (06/15/2022) Event monitor: Pauses of 3.8 seconds. (2021)  Risk Assessment/Calculations:            Physical Exam:   VS:  BP 134/80   Pulse 70   Ht 5' 9.5" (1.765 m)   Wt 190 lb 6.4 oz (86.4 kg)   SpO2 97%   BMI 27.71 kg/m    Wt Readings from Last 3 Encounters:  08/10/23 190 lb 6.4 oz (86.4 kg)  06/28/23 185 lb (83.9 kg)  05/02/23 180 lb 12.4 oz (82 kg)    GEN: Well nourished, well developed in no acute distress NECK: No JVD; No carotid bruits CARDIAC: RRR, no murmurs, rubs, gallops RESPIRATORY:  Clear to auscultation without rales, wheezing or rhonchi  ABDOMEN: Soft, non-tender, non-distended EXTREMITIES:  No edema; No deformity   ASSESSMENT AND PLAN: .    Assessment and Plan    Coronary Artery Disease Status post cardiac catheterization on 06/15/2022 with patent Lima to LAD, SVG to acute marginal, SVG to PDA, chronic occlusion of SVG to right PLA branch, and acute occlusion of SVG to left posterior branch, posterior lateral branch with unsuccessful PCI. Patient reports intermittent chest discomfort, but the nature and location of the pain do not suggest angina. -Continue current management.  [id number]  AAA status post EVAR Ascending aortic aneurysm - On Plavix.  Dr. Archer Asa with interventional radiology has been monitoring endoleak. -Dr. Laneta Simmers has been monitoring his 44 mm ascending aortic aneurysm. - We consolidated the CT scan to evaluate both his ascending and descending aorta and 1 scan.  Sent the information to both physicians for review. -Need to try to continue to improve blood pressure control.   Chronic kidney disease stage IIIb - Creatinine from 1.8-2.1.  He sees Dr. Thedore Mins with nephrology.  Hyperlipidemia Patient on Atorvastatin 40mg  and Zetia, with LDL at goal (53). However, patient reports possible side effects including fatigue and muscle aches. -Trial off Atorvastatin for 2 weeks to assess for symptom improvement. -Reassess after 2 weeks and consider reinitiation of Atorvastatin. -If symptoms persist upon reinitiation, consider consultation with lipid management team for alternative therapy.  Atrial Fibrillation Patient has a history of pauses on event monitor in 2021 leading to discontinuation of Metoprolol. Patient is currently on Eliquis and Plavix. Patient has expressed interest in Watchman device due to bleeding complications and the need to stop anticoagulation for procedures and prior bleeding issues with urinary catheter. -Attempt to expedite appointment with Dr. Loletha Grayer to discuss Watchman device. -Continue current management with Eliquis and Plavix.  General Health Maintenance Patient reports regular exercise with dog walks and yard work. -Encourage continued physical activity. -Follow-up 1 yr            Signed, Donato Schultz, MD

## 2023-08-10 NOTE — Patient Instructions (Addendum)
Medication Instructions:  Your physician has recommended you make the following change in your medication:  1-STOP atorvastatin for 2 weeks and then give our office a call and let us know how you are feeling.  *If you need a refill on your cardiac medications before your next appointment, please call your pharmacy*  Lab Work: If you have labs (blood work) drawn today and your tests are completely normal, you will receive your results only by: MyChart Message (if you have MyChart) OR A paper copy in the mail If you have any lab test that is abnormal or we need to change your treatment, we will call you to review the results.  Testing/Procedures: None ordered today.  Follow-Up: At [facility], you and your health needs are our priority.  As part of our continuing mission to provide you with exceptional heart care, we have created designated Provider Care Teams.  These Care Teams include your primary Cardiologist (physician) and Advanced Practice Providers (APPs -  Physician Assistants and Nurse Practitioners) who all work together to provide you with the care you need, when you need it.  We recommend signing up for the patient portal called "MyChart".  Sign up information is provided on this After Visit Summary.  MyChart is used to connect with patients for Virtual Visits (Telemedicine).  Patients are able to view lab/test results, encounter notes, upcoming appointments, etc.  Non-urgent messages can be sent to your provider as well.   To learn more about what you can do with MyChart, go to [email or web address].    Your next appointment:   1 year(s)  Provider:   Donato Schultz, MD     Your physician recommends that you reschedule consult as soon as possible with Dr. Lalla Brothers.

## 2023-08-10 NOTE — Telephone Encounter (Signed)
Patient referred to Dr. Lalla Brothers for consideration of LAAO closure with Watchman however missed his appointment 9/5. He was see by Dr. Anne Fu today and wishes to be rescheduled. Attempted to call the patient however was unable to reach him. Will send MyChart message with available appointments with Dr. Lalla Brothers.   Georgie Chard NP-C Structural Heart Team  Pager: [phone] Phone: [phone]

## 2023-08-10 NOTE — Telephone Encounter (Signed)
Called and spoke with pt, scheduled appt for 09/12/2023 at 9:20 AM pt verbalized understanding he will arrive early

## 2023-08-14 DIAGNOSIS — E119 Type 2 diabetes mellitus without complications: Secondary | ICD-10-CM | POA: Diagnosis not present

## 2023-08-14 DIAGNOSIS — H43812 Vitreous degeneration, left eye: Secondary | ICD-10-CM | POA: Diagnosis not present

## 2023-08-14 DIAGNOSIS — H35372 Puckering of macula, left eye: Secondary | ICD-10-CM | POA: Diagnosis not present

## 2023-08-14 DIAGNOSIS — H353132 Nonexudative age-related macular degeneration, bilateral, intermediate dry stage: Secondary | ICD-10-CM | POA: Diagnosis not present

## 2023-08-14 DIAGNOSIS — H43821 Vitreomacular adhesion, right eye: Secondary | ICD-10-CM | POA: Diagnosis not present

## 2023-08-14 DIAGNOSIS — H43392 Other vitreous opacities, left eye: Secondary | ICD-10-CM | POA: Diagnosis not present

## 2023-08-20 ENCOUNTER — Other Ambulatory Visit: Payer: Self-pay | Admitting: Cardiology

## 2023-08-31 ENCOUNTER — Other Ambulatory Visit: Payer: Self-pay | Admitting: Cardiology

## 2023-09-06 DIAGNOSIS — E559 Vitamin D deficiency, unspecified: Secondary | ICD-10-CM | POA: Diagnosis not present

## 2023-09-06 DIAGNOSIS — I700 Atherosclerosis of aorta: Secondary | ICD-10-CM | POA: Diagnosis not present

## 2023-09-06 DIAGNOSIS — E782 Mixed hyperlipidemia: Secondary | ICD-10-CM | POA: Diagnosis not present

## 2023-09-06 DIAGNOSIS — D6869 Other thrombophilia: Secondary | ICD-10-CM | POA: Diagnosis not present

## 2023-09-06 DIAGNOSIS — I4891 Unspecified atrial fibrillation: Secondary | ICD-10-CM | POA: Diagnosis not present

## 2023-09-06 DIAGNOSIS — M109 Gout, unspecified: Secondary | ICD-10-CM | POA: Diagnosis not present

## 2023-09-06 DIAGNOSIS — N184 Chronic kidney disease, stage 4 (severe): Secondary | ICD-10-CM | POA: Diagnosis not present

## 2023-09-06 DIAGNOSIS — M48061 Spinal stenosis, lumbar region without neurogenic claudication: Secondary | ICD-10-CM | POA: Diagnosis not present

## 2023-09-06 DIAGNOSIS — I10 Essential (primary) hypertension: Secondary | ICD-10-CM | POA: Diagnosis not present

## 2023-09-06 DIAGNOSIS — E538 Deficiency of other specified B group vitamins: Secondary | ICD-10-CM | POA: Diagnosis not present

## 2023-09-06 DIAGNOSIS — E039 Hypothyroidism, unspecified: Secondary | ICD-10-CM | POA: Diagnosis not present

## 2023-09-06 DIAGNOSIS — G2581 Restless legs syndrome: Secondary | ICD-10-CM | POA: Diagnosis not present

## 2023-09-06 DIAGNOSIS — Z23 Encounter for immunization: Secondary | ICD-10-CM | POA: Diagnosis not present

## 2023-09-06 DIAGNOSIS — E1122 Type 2 diabetes mellitus with diabetic chronic kidney disease: Secondary | ICD-10-CM | POA: Diagnosis not present

## 2023-09-06 LAB — LAB REPORT - SCANNED
Albumin, Urine POC: 1.8
Creatinine, POC: 79 mg/dL
EGFR: 44
Microalb Creat Ratio: 22.7

## 2023-09-11 ENCOUNTER — Other Ambulatory Visit: Payer: Self-pay | Admitting: Cardiology

## 2023-09-11 NOTE — Progress Notes (Unsigned)
Electrophysiology Office Note:    Date:  09/12/2023   ID:  Garrett WENG Sr., DOB [birth year], MRN [id number]  [facility] Cardiologist:  Donato Schultz, MD  [facility] Electrophysiologist:  Lanier Prude, MD   Referring MD: [name], MD   Chief Complaint: AF  History of Present Illness:      Discussed the use of AI scribe software for clinical note transcription with the patient, who gave verbal consent to proceed.  History of Present Illness   Garrett Jordan, an 82 year old with Jordan history of severe coronary artery disease post-CABG, atrial fibrillation, and kidney stones, presents for evaluation of atrial fibrillation. He is currently on Eliquis and Plavix for his atrial fibrillation and coronary artery disease, respectively. However, he has experienced bleeding complications, including hematuria, while on Eliquis and wishes to explore other stroke risk mitigation strategies that avoid long-term exposure to anticoagulation. He has Jordan history of kidney stones, which required Jordan procedure to break them up. This procedure was delayed due to the need to stop his blood thinners, resulting in an eight-day hospital stay. He also experienced significant hematuria following this procedure, which he attributes to his blood thinners.               Their past medical, social and family history was reveiwed.   ROS:   Please see the history of present illness.    All other systems reviewed and are negative.  EKGs/Labs/Other Studies Reviewed:    The following studies were reviewed today:  06/16/2023 Echo EF 55 RV mildly reduced Dilated LA/RA Ascending aorta 44mm        Physical Exam:    VS:  BP (!) 140/66 (BP Location: Left Arm, Patient Position: Sitting, Cuff Size: Normal) Comment: hasn't taken all medication this am  Pulse 66   Ht 5\' 9"  (1.753 Jordan)   Wt 190 lb 9.6 oz (86.5 kg)   SpO2 97%   BMI 28.15 kg/Jordan     Wt Readings from Last 3 Encounters:  09/12/23 190 lb  9.6 oz (86.5 kg)  08/10/23 190 lb 6.4 oz (86.4 kg)  06/28/23 185 lb (83.9 kg)     GEN:  Well nourished, well developed in no acute distress CARDIAC: RRR, no murmurs, rubs, gallops RESPIRATORY:  Clear to auscultation without rales, wheezing or rhonchi       ASSESSMENT AND PLAN:    1. Paroxysmal atrial fibrillation (HCC)   2. Coronary artery disease of native artery of native heart with stable angina pectoris (HCC)   3. Jordan/Jordan CABG (coronary artery bypass graft)   4. Hypertension associated with diabetes (HCC)      Assessment and Plan    Atrial Fibrillation History of bleeding complications on Eliquis. Discussed the risks/benefits of left atrial appendage occlusion vs anticoagulation. Patient wishes to proceed with left atrial appendage occlusion. -Schedule left atrial appendage occlusion procedure. -Continue Eliquis until procedure.  Coronary Artery Disease Severe, post-CABG. No ischemic symptoms today. -Continue Plavix.  Hypertension Controlled. -Continue amlodipine, Coreg, and olmesartan.  Hematuria History of kidney stones and significant bleeding during prior procedures. -Monitor closely, especially in the context of anticoagulation therapy.       -----------------  I have seen Garrett Jordan Sr. in the office today who is being considered for Jordan Garrett left atrial appendage closure device. I believe they will benefit from this procedure given their history of atrial fibrillation, CHA2DS2-VASc score of 5 and unadjusted ischemic stroke rate of 7.2% per year. Unfortunately, the patient is  not felt to be Jordan long term anticoagulation candidate secondary to hx of hematuria and concomitant antiplatelet use. The patient'Jordan chart has been reviewed and I feel that they would be Jordan candidate for short term oral anticoagulation after Garrett implant.   It is my belief that after undergoing Jordan LAA closure procedure, Garrett Jordan Sr. will not need long term anticoagulation which  eliminates anticoagulation side effects and major bleeding risk.   Procedural risks for the Garrett implant have been reviewed with the patient including Jordan 0.5% risk of stroke, <1% risk of perforation and <1% risk of device embolization. Other risks include bleeding, vascular damage, tamponade, worsening renal function, and death. The patient understands these risk and wishes to proceed.     The published clinical data on the safety and effectiveness of Garrett include but are not limited to the following: - Garrett Jordan, Garrett Jordan, Garrett Jordan et al. for the PROTECT AF Investigators. Percutaneous closure of the left atrial appendage versus warfarin therapy for prevention of stroke in patients with atrial fibrillation: Jordan randomised non-inferiority trial. Lancet 2009; 374: 534-42. Garrett Jordan, Garrett Jordan, Garrett Jordan et al. on behalf of the PROTECT AF Investigators. Percutaneous Left Atrial Appendage Closure for Stroke Prophylaxis in Patients With Atrial Fibrillation 2.3-Year Follow-up of the PROTECT AF (Garrett Jordan for Embolic Protection in Patients With Atrial Fibrillation) Trial. Circulation 2013; 127:720-729. - Garrett Jordan, Garrett Jordan,  Garrett Jordan, Garrett Jordan, Garrett Jordan et al. Quality of Life Assessment in the Randomized PROTECT AF (Percutaneous Closure of the Left Atrial Appendage Versus Warfarin Therapy for Prevention of Stroke in Patients With Atrial Fibrillation) Trial of Patients at Risk for Stroke With Nonvalvular Atrial Fibrillation. Jordan Am Coll Cardiol 2013; 61:1790-8. Garrett Jordan, Garrett Jordan, Garrett Jordan, Garrett Jordan, Garrett Jordan, Garrett Jordan, Garrett Jordan, Garrett V. Prospective randomized evaluation of the Garrett left atrial appendage Device in patients with atrial fibrillation versus long-term warfarin therapy; the PREVAIL trial. Journal of the Garrett Jordan, Vol. 4, No. 1, 2014, 1-11. - Garrett Jordan, Garrett Jordan, Garrett Jordan, Garrett Jordan, Garrett Jordan et al. Primary outcome evaluation of Jordan next-generation  left atrial appendage closure device: results from the PINNACLE FLX trial. Circulation 2021;143(18)1754-1762.    After today'Jordan visit with the patient which was dedicated solely for shared decision making visit regarding LAA closure device, the patient decided to proceed with the LAA appendage closure procedure scheduled to be done in the near future at [facility].   He will NOT Need Jordan CT prior to the procedure given Jordan CT chest in 2023.Marland Kitchen   HAS-BLED score 4 Hypertension Yes  Abnormal renal and liver function (Dialysis, transplant, Cr >2.26 mg/dL /Cirrhosis or Bilirubin >2x Normal or AST/ALT/AP >3x Normal) No  Stroke No  Bleeding Yes  Labile INR (Unstable/high INR) No  Elderly (>65) Yes  Drugs or alcohol (>= 8 drinks/week, anti-plt or NSAID) Yes   CHA2DS2-VASc Score = 5  The patient'Jordan score is based upon: CHF History: 0 HTN History: 1 Diabetes History: 1 Stroke History: 0 Vascular Disease History: 1 Age Score: 2 Gender Score: 0            Signed, Brandon Scarbrough T. Lalla Brothers, MD, [facility], [facility] 09/12/2023 9:50 AM    Electrophysiology [facility]

## 2023-09-12 ENCOUNTER — Encounter: Payer: Self-pay | Admitting: Cardiology

## 2023-09-12 ENCOUNTER — Ambulatory Visit: Payer: Medicare Other | Attending: Cardiology | Admitting: Cardiology

## 2023-09-12 VITALS — BP 140/66 | HR 66 | Ht 69.0 in | Wt 190.6 lb

## 2023-09-12 DIAGNOSIS — I25118 Atherosclerotic heart disease of native coronary artery with other forms of angina pectoris: Secondary | ICD-10-CM | POA: Diagnosis not present

## 2023-09-12 DIAGNOSIS — I152 Hypertension secondary to endocrine disorders: Secondary | ICD-10-CM

## 2023-09-12 DIAGNOSIS — I480 Paroxysmal atrial fibrillation: Secondary | ICD-10-CM | POA: Diagnosis not present

## 2023-09-12 DIAGNOSIS — E1159 Type 2 diabetes mellitus with other circulatory complications: Secondary | ICD-10-CM

## 2023-09-12 DIAGNOSIS — Z951 Presence of aortocoronary bypass graft: Secondary | ICD-10-CM

## 2023-09-12 NOTE — Patient Instructions (Signed)
 Medication Instructions:  Your physician recommends that you continue on your current medications as directed. Please refer to the Current Medication list given to you today.  *If you need a refill on your cardiac medications before your next appointment, please call your pharmacy*  Testing/Procedures: Your physician has requested that you have Left atrial appendage (LAA) closure device implantation is a procedure to put a small device in the LAA of the heart. The LAA is a small sac in the wall of the heart's left upper chamber. Blood clots can form in this area. The device, Watchman closes the LAA to help prevent a blood clot and stroke.   Follow-Up: At [facility], you and your health needs are our priority.  As part of our continuing mission to provide you with exceptional heart care, we have created designated Provider Care Teams.  These Care Teams include your primary Cardiologist (physician) and Advanced Practice Providers (APPs -  Physician Assistants and Nurse Practitioners) who all work together to provide you with the care you need, when you need it.  Your next appointment:   You will be contacted by Nurse Navigator, Karsten Fells to schedule your pre-procedure visit and procedure date. If you have any questions she can be reached at [phone].

## 2023-09-16 ENCOUNTER — Other Ambulatory Visit: Payer: Self-pay | Admitting: Internal Medicine

## 2023-09-16 DIAGNOSIS — E1122 Type 2 diabetes mellitus with diabetic chronic kidney disease: Secondary | ICD-10-CM

## 2023-09-19 ENCOUNTER — Telehealth: Payer: Self-pay | Admitting: *Deleted

## 2023-09-19 ENCOUNTER — Inpatient Hospital Stay
Admission: RE | Admit: 2023-09-19 | Discharge: 2023-09-19 | Disposition: A | Discharge status: Home / Self Care | Payer: Medicare Other | Source: Ambulatory Visit | Attending: Surgery | Admitting: Surgery

## 2023-09-19 ENCOUNTER — Ambulatory Visit
Admission: RE | Admit: 2023-09-19 | Discharge: 2023-09-19 | Disposition: A | Discharge status: Home / Self Care | Payer: Medicare Other | Source: Ambulatory Visit | Attending: Surgery | Admitting: Surgery

## 2023-09-19 ENCOUNTER — Ambulatory Visit: Payer: Medicare Other | Admitting: Surgery

## 2023-09-19 DIAGNOSIS — I7122 Aneurysm of the aortic arch, without rupture: Secondary | ICD-10-CM | POA: Diagnosis not present

## 2023-09-19 DIAGNOSIS — I723 Aneurysm of iliac artery: Secondary | ICD-10-CM | POA: Diagnosis not present

## 2023-09-19 DIAGNOSIS — I7121 Aneurysm of the ascending aorta, without rupture: Secondary | ICD-10-CM

## 2023-09-19 DIAGNOSIS — Z95828 Presence of other vascular implants and grafts: Secondary | ICD-10-CM | POA: Diagnosis not present

## 2023-09-19 MED ORDER — IOPAMIDOL (ISOVUE-370) INJECTION 76%
500.0000 mL | Freq: Once | INTRAVENOUS | Status: AC | PRN
  Administered 2023-09-19: 85 mL via INTRAVENOUS

## 2023-09-19 NOTE — Telephone Encounter (Signed)
STAT report called in from GI regarding today's CTA c/a/p. Impression reviewed with Dr. Donata Clay. No further orders at this time. Patient has a follow up appt scheduled with Dr. Laneta Simmers 11/13.

## 2023-09-23 ENCOUNTER — Encounter: Payer: Self-pay | Admitting: Cardiology

## 2023-09-23 ENCOUNTER — Other Ambulatory Visit: Payer: Self-pay | Admitting: Cardiology

## 2023-09-23 DIAGNOSIS — I480 Paroxysmal atrial fibrillation: Secondary | ICD-10-CM

## 2023-09-24 NOTE — Telephone Encounter (Signed)
Prescription refill request for Eliquis received. Indication:afib Last office visit:10/24 Scr:1.31  6/24 Age: 82 Weight:86.5  kg  Prescription refilled

## 2023-09-25 ENCOUNTER — Encounter: Payer: Self-pay | Admitting: Internal Medicine

## 2023-09-25 ENCOUNTER — Ambulatory Visit: Payer: Medicare Other | Admitting: Internal Medicine

## 2023-09-25 VITALS — BP 126/80 | HR 55 | Ht 69.0 in | Wt 194.0 lb

## 2023-09-25 DIAGNOSIS — E1159 Type 2 diabetes mellitus with other circulatory complications: Secondary | ICD-10-CM | POA: Diagnosis not present

## 2023-09-25 DIAGNOSIS — E1142 Type 2 diabetes mellitus with diabetic polyneuropathy: Secondary | ICD-10-CM

## 2023-09-25 DIAGNOSIS — N1832 Chronic kidney disease, stage 3b: Secondary | ICD-10-CM

## 2023-09-25 DIAGNOSIS — E1122 Type 2 diabetes mellitus with diabetic chronic kidney disease: Secondary | ICD-10-CM

## 2023-09-25 DIAGNOSIS — Z7984 Long term (current) use of oral hypoglycemic drugs: Secondary | ICD-10-CM | POA: Diagnosis not present

## 2023-09-25 LAB — POCT GLYCOSYLATED HEMOGLOBIN (HGB A1C): Hemoglobin A1C: 6.8 % — AB (ref 4.0–5.6)

## 2023-09-25 MED ORDER — GLIPIZIDE 5 MG PO TABS
5.0000 mg | ORAL_TABLET | Freq: Two times a day (BID) | ORAL | 3 refills | Status: DC
Start: 2023-09-25 — End: 2024-03-25

## 2023-09-25 MED ORDER — DAPAGLIFLOZIN PROPANEDIOL 10 MG PO TABS: 10.0000 mg | ORAL_TABLET | Freq: Every day | ORAL | Status: DC

## 2023-09-25 NOTE — Patient Instructions (Addendum)
-   Change  Glipizide 5 mg , 1 tablet before Breakfast and before Supper  - Continue  Farxiga 10 mg daily       HOW TO TREAT LOW BLOOD SUGARS (Blood sugar LESS THAN 70 MG/DL) Please follow the RULE OF 15 for the treatment of hypoglycemia treatment (when your (blood sugars are less than 70 mg/dL)   STEP 1: Take 15 grams of carbohydrates when your blood sugar is low, which includes:  3-4 GLUCOSE TABS  OR 3-4 OZ OF JUICE OR REGULAR SODA OR ONE TUBE OF GLUCOSE GEL    STEP 2: RECHECK blood sugar in 15 MINUTES STEP 3: If your blood sugar is still low at the 15 minute recheck --> then, go back to STEP 1 and treat AGAIN with another 15 grams of carbohydrates.

## 2023-09-25 NOTE — Progress Notes (Signed)
Name: Garrett Jordan  Age/ Sex: 82 y.o., male   MRN/ DOB: [id number], 1941-10-06     PCP: [name], MD   Reason for Endocrinology Evaluation: Type 2 Diabetes Mellitus     Initial Endocrinology Clinic Visit:  03/17/2020    PATIENT IDENTIFIER: Garrett Jordan. is a 82 y.o. male with a past medical history of T2DM, RLS, CAD and CKD. The patient has followed with Endocrinology clinic since 03/17/2020 for consultative assistance with management of his diabetes.      DIABETIC HISTORY:  Garrett Jordan was diagnosed with T2 DM in 2019. Invokana- cost prohibitive . His hemoglobin A1c has ranged from 6.7% in 2020, peaking at 7.8 % in 2021    On his initial visit to our clinic his A1c was 6.5 % , he was on Januvia which was continued, Invokana was cost prohibitive and we switched it to Glipizide.   Januvia stopped 06/2020 due to high cost for pt  Jardiance started by nephrology by 9/2021but this was temporarily held by cardiology due to elevated Cr but restarted 09/2022    HYPERCALCEMIA HISTORY: Garrett Jordan indicates that he was first noted with hypercalcemia in 2020. He has hx of kidney stones  He is S/P Left parathyroidectomy in 1970's  Ca/Cr ratio 0.0210  SUBJECTIVE:    During the last visit (03/22/2023): A1c 6.6%   Today (09/25/2023): Garrett Jordan is here for a follow up on diabetes management.  He checks his blood sugars 2 times daily . The patient has not had hypoglycemic episodes since the last clinic visit.  Since his last visit here, the patient presented to the ED with calculus of the upper urinary tract with pyelonephritis followed by urinary retention and hematuria... Patient has been following up with urology, s/p cystoscopy, ureteroscopy and stone extraction 05/03/2023  He continues to follow-up with cardiology for paroxysmal A-fib, CAD, s/p CABG and HTN   Denies nausea or vomiting  Has noted with occasional diarrhea but no constipation , he is on probiotic  He  does endorse occasional tingling at night, worse on the left   HOME DIABETES REGIMEN:  Glipizide 5 mg, half a tablet before Breakfast and before Supper  Farxiga 10 mg daily     Statin: yes ACE-I/ARB: yes     METER DOWNLOAD SUMMARY:Unable to download   108- 196 mg/dL     DIABETIC COMPLICATIONS: Microvascular complications:  CKD III Denies: retinopathy, neuropathy  Last Eye Exam: Completed 01/30/2023  Macrovascular complications:  CAD (s/p CABG 2010), NSTEMI 06/2022 Denies: CVA, PVD       PHYSICAL EXAM: VS: BP 126/80 (BP Location: Left Arm, Patient Position: Sitting, Cuff Size: Small)   Pulse (!) 55   Ht 5\' 9"  (1.753 m)   Wt 194 lb (88 kg)   SpO2 95%   BMI 28.65 kg/m    EXAM: General: Pt appears well and is in NAD  Lungs: Clear with good BS bilat with no rales, rhonchi, or wheezes  Heart: Auscultation: RRR  Extremities:  BL OZ:HYQMV edema on the left with scab formation due to an injury but no evidence of infection  Mental Status: Judgment, insight: intact Orientation: oriented to time, place, and person Mood and affect: no depression, anxiety, or agitation    DM Foot Exam 09/25/2023  The skin of the feet is intact without sores or ulcerations. The pedal pulses are 1+ on right and 1+ on left. The sensation is intact to a screening 5.07, 10  gram monofilament bilaterally    HISTORY:  Past Medical History:  Past Medical History:  Diagnosis Date   AAA (abdominal aortic aneurysm) (HCC)    Arthritis    knees   Atrial fibrillation (HCC)    CAD (coronary artery disease)    Cancer (HCC)    prostate   CKD (chronic kidney disease), stage III (HCC) 06/18/2022   COVID    06/2019 and in 2022 states they were mild cases   Diabetes mellitus without complication (HCC)    Dyspnea    once in a while   Dysrhythmia    A-fib pt is on Eliquis   GERD (gastroesophageal reflux disease)    Gout    History of kidney stones    Hyperlipidemia    Hypertension     Hypothyroidism    Myocardial infarction (HCC) 01/1991   sp inferior   Nerve compression    right leg   Reflux    Seasonal allergies    Thoracic ascending aortic aneurysm (HCC)    Thyroid disease    hypothyroidism   Past Surgical History:  Past Surgical History:  Procedure Laterality Date   ABDOMINAL AORTAGRAM N/A 02/28/2012   Procedure: ABDOMINAL Ronny Flurry;  Surgeon: Nada Libman, MD;  Location: [facility] CATH LAB;  Service: Cardiovascular;  Laterality: N/A;   abdominal aortagram embolization  02/28/2012   ABDOMINAL AORTIC ANEURYSM REPAIR  07/14/2012   BIOPSY  06/28/2023   Procedure: BIOPSY;  Surgeon: Charlott Rakes, MD;  Location: [facility] ENDOSCOPY;  Service: Gastroenterology;;   CARDIAC CATHETERIZATION     CATARACT EXTRACTION Bilateral 2016   COLONOSCOPY WITH PROPOFOL N/A 06/28/2023   Procedure: COLONOSCOPY WITH PROPOFOL;  Surgeon: Charlott Rakes, MD;  Location: [facility] ENDOSCOPY;  Service: Gastroenterology;  Laterality: N/A;   CORONARY ARTERY BYPASS GRAFT  04/13/2009   CORONARY BALLOON ANGIOPLASTY N/A 06/15/2022   Procedure: CORONARY BALLOON ANGIOPLASTY;  Surgeon: Tonny Bollman, MD;  Location: [facility] INVASIVE CV LAB;  Service: Cardiovascular;  Laterality: N/A;   CORONARY STENT PLACEMENT  1992 and  2009   RCA   CYSTOSCOPY W/ URETERAL STENT PLACEMENT Right 04/24/2023   Procedure: CYSTOSCOPY WITH RETROGRADE PYELOGRAM/URETERAL STENT PLACEMENT;  Surgeon: Crist Fat, MD;  Location: [facility] OR;  Service: Urology;  Laterality: Right;   CYSTOSCOPY/URETEROSCOPY/HOLMIUM LASER/STENT PLACEMENT Right 05/03/2023   Procedure: CYSTOSCOPY/URETEROSCOPY/HOLMIUM LASER/STONE EXTRACTION/STENT REMOVAL, FULGURATION;  Surgeon: Marcine Matar, MD;  Location: [facility] ORS;  Service: Urology;  Laterality: Right;  1 HR   EMBOLIZATION Right 02/28/2012   Procedure: EMBOLIZATION;  Surgeon: Nada Libman, MD;  Location: [facility] CATH LAB;  Service: Cardiovascular;  Laterality: Right;   IR GENERIC HISTORICAL  12/05/2016   IR  RADIOLOGIST EVAL & MGMT 12/05/2016 Malachy Moan, MD [facility] INTERV RAD   IR RADIOLOGIST EVAL & MGMT  07/10/2019   IR RADIOLOGIST EVAL & MGMT  08/25/2020   IR RADIOLOGIST EVAL & MGMT  08/16/2021   IR RADIOLOGIST EVAL & MGMT  08/31/2022   KIDNEY STONE SURGERY     LAMINECTOMY WITH POSTERIOR LATERAL ARTHRODESIS LEVEL 1 Bilateral 11/10/2022   Procedure: Lumbar Two-Lumbar Three laminectomy  - bilateral with posterior lateral fusion and interspinous plating;  Surgeon: Tia Alert, MD;  Location: [facility] OR;  Service: Neurosurgery;  Laterality: Bilateral;   LEFT HEART CATH AND CORONARY ANGIOGRAPHY N/A 06/15/2022   Procedure: LEFT HEART CATH AND CORONARY ANGIOGRAPHY;  Surgeon: Tonny Bollman, MD;  Location: [facility] INVASIVE CV LAB;  Service: Cardiovascular;  Laterality: N/A;   LEFT HEART CATHETERIZATION WITH CORONARY/GRAFT ANGIOGRAM N/A 01/08/2015  Procedure: LEFT HEART CATHETERIZATION WITH Isabel Caprice;  Surgeon: Othella Boyer, MD;  Location: [facility] CATH LAB;  Service: Cardiovascular;  Laterality: N/A;   parathyroid adenoma  1981   surgery   POLYPECTOMY  06/28/2023   Procedure: POLYPECTOMY;  Surgeon: Charlott Rakes, MD;  Location: [facility] ENDOSCOPY;  Service: Gastroenterology;;   PROSTATE SURGERY     rad seeds   SPINE SURGERY  2006   TONSILLECTOMY  1955   Social History:  reports that he quit smoking about 53 years ago. His smoking use included cigarettes. He has never used smokeless tobacco. He reports current alcohol use of about 6.0 standard drinks of alcohol per week. He reports that he does not use drugs. Family History:  Family History  Problem Relation Age of Onset   Heart disease Father        Heart Disease before age 83   Heart attack Father    Hyperlipidemia Father    Hypertension Father    Stroke Mother    Deep vein thrombosis Mother    Heart disease Sister    Diabetes Sister    Hyperlipidemia Sister    Hypertension Sister    Heart attack Sister    Heart disease Paternal  Uncle    Diabetes Maternal Grandmother    Diabetes Sister      HOME MEDICATIONS: Allergies as of 09/25/2023       Reactions   Quinolones Other (See Comments)   Aortic root enlargement   Ace Inhibitors Cough   Crestor [rosuvastatin] Other (See Comments)   Muscle aches   Nitroglycerin Other (See Comments)   Very pronounced lowered B/P with IV nitro for heart caths        Medication List        Accurate as of September 25, 2023  7:33 AM. If you have any questions, ask your nurse or doctor.          Accu-Chek Guide test strip Generic drug: glucose blood as directed In Vitro/ DX E11.9 test blood sugars daily for 90 days   Accu-Chek Guide test strip Generic drug: glucose blood USE TO TEST TWICE DAILY   acetaminophen 650 MG CR tablet Commonly known as: TYLENOL Take 1,950 mg by mouth 2 (two) times daily as needed for pain.   allopurinol 300 MG tablet Commonly known as: ZYLOPRIM Take 300 mg by mouth See admin instructions. Take 300 mg by mouth every other evening   amLODipine 10 MG tablet Commonly known as: NORVASC TAKE 1 TABLET BY MOUTH DAILY   Aspercreme Lidocaine 4 % Generic drug: lidocaine Place 1 patch onto the skin daily as needed (pain).   atorvastatin 40 MG tablet Commonly known as: LIPITOR TAKE 1 TABLET BY MOUTH EVERY DAY   carvedilol 6.25 MG tablet Commonly known as: COREG Take 1 tablet (6.25 mg total) by mouth 2 (two) times daily.   CHOLESTOFF PLUS PO Take 1 capsule by mouth daily.   clonazePAM 1 MG tablet Commonly known as: KLONOPIN Take 0.5 mg by mouth See admin instructions. Take 0.5 mg by mouth at bedtime and an additional 0.5 mg once a day as needed for anxiety   clopidogrel 75 MG tablet Commonly known as: PLAVIX TAKE 1 TABLET BY MOUTH DAILY   Coenzyme Q10 300 MG Caps Take 300 mg by mouth in the morning.   dapagliflozin propanediol 10 MG Tabs tablet Commonly known as: Farxiga Take 1 tablet (10 mg total) by mouth daily before  breakfast.   Eliquis 5 MG Tabs tablet  Generic drug: apixaban TAKE 1 TABLET BY MOUTH TWICE A DAY   ezetimibe 10 MG tablet Commonly known as: ZETIA TAKE 1 TABLET BY MOUTH DAILY   fenofibrate micronized 200 MG capsule Commonly known as: LOFIBRA TAKE 1 CAPSULE BY MOUTH DAILY  BEFORE BREAKFAST What changed: See the new instructions.   fluorouracil 5 % cream Commonly known as: EFUDEX Apply 1 Application topically See admin instructions. Apply to affected area of the face 2 times a day AS DIRECTED   fluticasone 50 MCG/ACT nasal spray Commonly known as: FLONASE Place 2 sprays into both nostrils daily as needed for allergies.   glipiZIDE 5 MG tablet Commonly known as: GLUCOTROL Take 0.5 tablets (2.5 mg total) by mouth 2 (two) times daily before a meal.   isosorbide mononitrate 30 MG 24 hr tablet Commonly known as: IMDUR Take 1 tablet (30 mg total) by mouth daily.   levothyroxine 175 MCG tablet Commonly known as: SYNTHROID Take 175 mcg by mouth daily before breakfast.   loratadine 10 MG tablet Commonly known as: CLARITIN Take 10 mg by mouth at bedtime.   magnesium oxide 400 (240 Mg) MG tablet Commonly known as: MAG-OX Take 400 mg by mouth every evening.   nitroGLYCERIN 0.4 MG SL tablet Commonly known as: Nitrostat Place 1 tablet (0.4 mg total) under the tongue every 5 (five) minutes as needed for chest pain.   olmesartan 40 MG tablet Commonly known as: BENICAR Take 1 tablet (40 mg total) by mouth daily. What changed: when to take this   pantoprazole 40 MG tablet Commonly known as: PROTONIX TAKE 1 TABLET BY MOUTH DAILY   PreserVision AREDS 2 Caps Take 1 capsule by mouth 2 (two) times daily.   SYSTANE OP Place 1 drop into both eyes daily as needed (dry/irritated eyes).   tamsulosin 0.4 MG Caps capsule Commonly known as: FLOMAX Take 1 capsule (0.4 mg total) by mouth daily.   triamcinolone cream 0.1 % Commonly known as: KENALOG Apply 1 application  topically  daily as needed (rash).   TURMERIC PO Take 2 tablets by mouth See admin instructions. Chew 2 gummies by mouth two times a week   Vascepa 1 g capsule Generic drug: icosapent Ethyl TAKE 2 CAPSULES BY MOUTH TWICE A DAY   Vitamin C 500 MG Caps Take 500 mg by mouth every evening.   Vitamin D3 50 MCG (2000 UT) Tabs Take 2,000 Units by mouth daily.        DM foot exam: 09/25/2023   The skin of the feet is intact without sores or ulcerations. The pedal pulses are 2+ on right and 2+ on left. The sensation is decreased to a screening 5.07, 10 gram monofilament at the left heel     DATA REVIEWED:  Lab Results  Component Value Date   HGBA1C 6.8 (A) 09/25/2023   HGBA1C 6.1 (H) 04/25/2023   HGBA1C 6.6 (A) 09/21/2022   09/06/2023  HDL 32 Tg 162 LDL 45 TSH 1.62 Ma/cr ratio 22.7    Latest Reference Range & Units 05/03/23 04:22  Sodium 135 - 145 mmol/L 136  Potassium 3.5 - 5.1 mmol/L 3.9  Chloride 98 - 111 mmol/L 106  CO2 22 - 32 mmol/L 24  Glucose 70 - 99 mg/dL 191 (H)  BUN 8 - 23 mg/dL 22  Creatinine 4.78 - 2.95 mg/dL 6.21 (H)  Calcium 8.9 - 10.3 mg/dL 9.6  Anion gap 5 - 15  6  GFR, Estimated >60 mL/min 55 (L)  (H): Data is abnormally high (  L): Data is abnormally low  ASSESSMENT / PLAN / RECOMMENDATIONS:   1) Type 2 Diabetes Mellitus, Optimally controlled, With CKD III , neuropathic and macrovascular complications - Most recent A1c of 6.8 %. Goal A1c < 7.0%.   - His A1c remains at goal, but he has been noted with hyperglycemia upon reviewing glucose meter. Pt does admit to snacking more often recently , with the holidays around the corner, we opted to increase glipizide again, pt to notify our office with hypoglycemia  - We also discussed increased risk of UTI with [place], he does have nephrolithiasis , which is another riskf factor for UTI. Will have to reconsider Marcelline Deist should he continue to have UTI's  - He did not qualify for patient assistance program for Farxiga   -Labs from outside facility were reviewed   MEDICATIONS:  Change glipizide 5 mg, 1  tablet before Breakfast and before supper  Continue Farxiga 10 mg daily    EDUCATION / INSTRUCTIONS: BG monitoring instructions: Patient is instructed to check his blood sugars 2 times a day, before breakfast and supper . Call [facility] if: BG persistently < 70  I reviewed the Rule of 15 for the treatment of hypoglycemia in detail with the patient. Literature supplied.     2) Diabetic complications:  Eye: Does not have known diabetic retinopathy.  Neuro/ Feet: Does have known diabetic peripheral neuropathy .  Renal: Patient does  have known baseline CKD. He   is on an ACEI/ARB at present.     3) Dyslipidemia/CAD:  -Per cardiology - LDL at goal    F/U in 6 months    Signed electronically by: Lyndle Herrlich, MD  [facility]  [facility] [address] [place], [place] [address] Phone: [phone] FAX: [phone]   CC: [name], MD [address] [place] [address] Phone: [phone]  Fax: [phone]  Return to Endocrinology clinic as below: Future Appointments  Date Time Provider Department Center  09/26/2023  2:30 PM Alleen Borne, MD [facility]  11/28/2023  2:00 PM Lanier Prude, MD [facility]

## 2023-09-26 ENCOUNTER — Encounter: Payer: Self-pay | Admitting: Surgery

## 2023-09-26 ENCOUNTER — Ambulatory Visit: Payer: Medicare Other | Admitting: Surgery

## 2023-09-26 VITALS — BP 151/75 | HR 63 | Resp 20 | Ht 69.0 in | Wt 187.0 lb

## 2023-09-26 DIAGNOSIS — I7121 Aneurysm of the ascending aorta, without rupture: Secondary | ICD-10-CM

## 2023-09-27 ENCOUNTER — Telehealth: Payer: Self-pay

## 2023-09-27 NOTE — Telephone Encounter (Signed)
The patient saw Dr. Laneta Simmers yesterday and 1 year visit and surveillance CT are planned for 09/2024.  The patient wishes to proceed with LAAO on 01/10/2024. Scheduled him for pre-procedure visit 12/17/2023. He was grateful for call and agreed with plan.

## 2023-09-30 NOTE — Progress Notes (Signed)
HPI:  The patient is an 82 year old gentleman with diffuse vascular disease who underwent CABG x5 in 2010 by Dr. Maren Beach.  He also underwent EVAR for abdominal aortic aneurysm by Dr. Myra Gianotti.  He has history of diabetes, hypertension, hyperlipidemia, atrial fibrillation, stage III chronic kidney disease, and hypothyroidism.  He was scheduled for back surgery in August 2023 but was admitted the day before with chest pressure and ruled in for a STEMI.  Catheterization showed acute occlusion of the saphenous vein graft to the left posterolateral branch which could not be opened.  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  He notes that his son had an aortic dissection repair age 20 by Dr. Cornelius Moras.  He has been feeling well overall and denies any chest or back pain.  Current Outpatient Medications  Medication Sig Dispense Refill   acetaminophen (TYLENOL) 650 MG CR tablet Take 1,950 mg by mouth 2 (two) times daily as needed for pain.     allopurinol (ZYLOPRIM) 300 MG tablet Take 300 mg by mouth See admin instructions. Take 300 mg by mouth every other evening     amLODipine (NORVASC) 10 MG tablet TAKE 1 TABLET BY MOUTH DAILY 100 tablet 2   Ascorbic Acid (VITAMIN C) 500 MG CAPS Take 500 mg by mouth every evening.     atorvastatin (LIPITOR) 40 MG tablet TAKE 1 TABLET BY MOUTH EVERY DAY 90 tablet 2   carvedilol (COREG) 6.25 MG tablet Take 1 tablet (6.25 mg total) by mouth 2 (two) times daily. 180 tablet 3   Cholecalciferol (VITAMIN D3) 2000 UNITS TABS Take 2,000 Units by mouth daily.      clonazePAM (KLONOPIN) 1 MG tablet Take 0.5 mg by mouth See admin instructions. Take 0.5 mg by mouth at bedtime and an additional 0.5 mg once a day as needed for anxiety     clopidogrel (PLAVIX) 75 MG tablet TAKE 1 TABLET BY MOUTH DAILY 90 tablet 3   Coenzyme Q10 300 MG CAPS Take 300 mg by mouth in the morning.      dapagliflozin propanediol (FARXIGA) 10 MG TABS tablet Take 1 tablet (10 mg total) by mouth daily before breakfast.     ELIQUIS 5 MG TABS tablet TAKE 1 TABLET BY MOUTH TWICE A DAY 180 tablet 1   ezetimibe (ZETIA) 10 MG tablet TAKE 1 TABLET BY MOUTH DAILY 100 tablet 2   fenofibrate micronized (LOFIBRA) 200 MG capsule TAKE 1 CAPSULE BY MOUTH DAILY  BEFORE BREAKFAST (Patient taking differently: Take 200 mg by mouth daily before breakfast.) 100 capsule 2   fluorouracil (EFUDEX) 5 % cream Apply 1 Application topically See admin instructions. Apply to affected area of the face 2 times a day AS DIRECTED     fluticasone (FLONASE) 50 MCG/ACT nasal spray Place 2 sprays into both nostrils daily as needed for allergies.     glipiZIDE (GLUCOTROL) 5 MG tablet Take 1 tablet (5 mg total) by mouth 2 (two) times daily before a meal. 180 tablet 3   glucose blood (ACCU-CHEK GUIDE) test strip USE TO TEST TWICE DAILY 200 strip 2   glucose blood (ACCU-CHEK GUIDE) test strip as directed In Vitro/ DX E11.9 test blood sugars daily for 90 days     isosorbide mononitrate (IMDUR) 30 MG 24 hr tablet Take 1 tablet (30 mg total) by mouth daily. 90 tablet 3   levothyroxine (SYNTHROID, LEVOTHROID) 175 MCG tablet Take  175 mcg by mouth daily before breakfast.     lidocaine (ASPERCREME LIDOCAINE) 4 % Place 1 patch onto the skin daily as needed (pain).     loratadine (CLARITIN) 10 MG tablet Take 10 mg by mouth at bedtime.     magnesium oxide (MAG-OX) 400 (240 Mg) MG tablet Take 400 mg by mouth every evening.     Multiple Vitamins-Minerals (PRESERVISION AREDS 2) CAPS Take 1 capsule by mouth 2 (two) times daily.     nitroGLYCERIN (NITROSTAT) 0.4 MG SL tablet Place 1 tablet (0.4 mg total) under the tongue every 5 (five) minutes as needed for chest pain. 25 tablet 3   olmesartan (BENICAR) 40 MG tablet Take 1 tablet (40 mg total) by mouth daily. (Patient taking differently: Take 40 mg by mouth every evening.) 90 tablet 3   pantoprazole  (PROTONIX) 40 MG tablet TAKE 1 TABLET BY MOUTH DAILY 90 tablet 2   Plant Sterols and Stanols (CHOLESTOFF PLUS PO) Take 1 capsule by mouth daily.     Polyethyl Glycol-Propyl Glycol (SYSTANE OP) Place 1 drop into both eyes daily as needed (dry/irritated eyes).     tamsulosin (FLOMAX) 0.4 MG CAPS capsule Take 1 capsule (0.4 mg total) by mouth daily. 30 capsule 2   triamcinolone cream (KENALOG) 0.1 % Apply 1 application  topically daily as needed (rash).  2   TURMERIC PO Take 2 tablets by mouth See admin instructions. Chew 2 gummies by mouth two times a week     VASCEPA 1 g capsule TAKE 2 CAPSULES BY MOUTH TWICE A DAY 360 capsule 3   No current facility-administered medications for this visit.     Physical Exam: BP (!) 151/75 (BP Location: Left Arm, Patient Position: Sitting)   Pulse 63   Resp 20   Ht 5\' 9"  (1.753 m)   Wt 187 lb (84.8 kg)   SpO2 96% Comment: RA  BMI 27.62 kg/m  He looks well. Cardiac exam shows regular rate and rhythm with normal heart sounds.  There is no murmur. Lungs are clear.  Diagnostic Tests:  Narrative & Impression  CLINICAL DATA:  Follow-up aortic aneurysm   EXAM: CT ANGIOGRAPHY CHEST, ABDOMEN AND PELVIS   TECHNIQUE: Non-contrast CT of the chest was initially obtained.   Multidetector CT imaging through the chest, abdomen and pelvis was performed using the standard protocol during bolus administration of intravenous contrast. Multiplanar reconstructed images and MIPs were obtained and reviewed to evaluate the vascular anatomy.   RADIATION DOSE REDUCTION: This exam was performed according to the departmental dose-optimization program which includes automated exposure control, adjustment of the mA and/or kV according to patient size and/or use of iterative reconstruction technique.   CONTRAST:  85mL ISOVUE-370 IOPAMIDOL (ISOVUE-370) INJECTION 76%   COMPARISON:  CTA chest, abdomen and pelvis dated August 22, 2022   FINDINGS: CTA CHEST FINDINGS    Cardiovascular: Cardiomegaly. No pericardial effusion. Severe coronary artery calcifications status post CABG. Interval occlusion of saphenous vein graft extending to the left circumflex circulation. Saphenous vein graft to the distal RCA with aneurysmal dilation measuring up to 16 mm and intramural thrombus causing mild narrowing on series 8, image 87, unchanged.   Mediastinum/Nodes: Esophagus and thyroid are unremarkable. No enlarged lymph nodes seen in the chest.   Lungs/Pleura: Central airways are patent. Mild atelectasis. No consolidation, pleural effusion or pneumothorax.   Musculoskeletal: Prior median sternotomy with intact sternal wires. No chest wall abnormality. No acute or significant osseous findings.   Review of the MIP images  confirms the above findings.   CTA ABDOMEN AND PELVIS FINDINGS   VASCULAR   Aorta: Dilated ascending thoracic aorta measuring 4.3 cm, unchanged. Normal caliber aortic arch and descending thoracic aorta. Moderate atherosclerotic disease of the thoracic aorta.   Multiple focal outpouchings of the aortic arch again seen, consistent with penetrating atherosclerotic ulcers. Penetrating atherosclerotic ulcers measuring measuring 15 x 9 mm on series 8, image 44 and 6 x 6 mm on image 35 are unchanged. A 9 x 6 mm penetrating atherosclerotic ulcer on image 33 is slightly increased in size, previously measured 6 x 6 mm.   Prior graft repair of abdominal aortic aneurysm with aorto bi-iliac stent. Excluded aneurysm sac measures 6.4 x 4.5 cm, unchanged. No evidence endoleak.   Celiac: Patent without evidence of aneurysm, dissection, vasculitis or significant stenosis.   SMA: Patent without evidence of aneurysm, dissection, vasculitis or significant stenosis.   Renals: Both renal arteries are patent without evidence of aneurysm, dissection, vasculitis, fibromuscular dysplasia or significant stenosis.   IMA: Origin has been embolized with coils.  Distal reconstitution of flow via collaterals.   Inflow: Stent grafts extend into the bilateral common iliac arteries and proximal right external iliac artery. Bilateral common and external iliac arteries and proximal femoral arteries are patent.   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 Stable dilation of the distal left common iliac artery measuring up to 1.6 cm. Prior coil embolization of the right internal iliac artery.   Veins: No obvious venous abnormality within the limitations of this arterial phase study.   Review of the MIP images confirms the above findings.   NON-VASCULAR   Hepatobiliary: No focal liver abnormality is seen. Gallstones with no evidence of gallbladder wall thickening.   Pancreas: Unremarkable. No pancreatic ductal dilatation or surrounding inflammatory changes.   Spleen: Normal in size without focal abnormality.   Adrenals/Urinary Tract: Bilateral adrenal glands are unremarkable. No hydronephrosis. Stone measuring 5 mm is seen in the left renal pelvis. Bilateral simple appearing renal cysts. Bladder is unremarkable.   Stomach/Bowel: Stomach is within normal limits. Appendix appears normal. No evidence of bowel wall thickening, distention, or inflammatory changes.   Lymphatic: No enlarged lymph nodes seen in the abdomen or pelvis.   Reproductive: Not visualized since inferior pelvis excluded from the field of view.   Other: Tiny fat containing umbilical hernia. No abdominopelvic ascites.   Musculoskeletal: Posterior fusion of L2-L3. No aggressive appearing osseous lesions.   Review of the MIP images confirms the above findings.   IMPRESSION: Vascular:   1. Interval occlusion of saphenous vein graft extending to the left circumflex circulation. Aneurysm dilatation and stenosis involving a saphenous vein graft supplying the right coronary  circulation, unchanged. 2. Stable mild dilation of the ascending thoracic aorta measuring up 4.3 cm. Recommend annual imaging followup by CTA or MRA. This recommendation follows 2010 ACCF/AHA/AATS/ACR/ASA/SCA/SCAI/SIR/STS/SVM Guidelines for the Diagnosis and Management of Patients with Thoracic Aortic Disease. Circulation. 2010; 121: N562-Z308. Aortic aneurysm NOS (ICD10-I71.9) 3. 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 4. Prior graft repair of abdominal aortic aneurysm with aorto bi-iliac stent. Stable size of excluded aneurysm sac with no evidence of endoleak. 5. 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   Nonvascular:   1. Nonobstructing stone measuring 5 mm in the left renal  pelvis,   These results will be called to the ordering clinician or representative by the Radiologist Assistant, and communication documented in the PACS or [employer].     Electronically Signed   By: Allegra Lai M.D.   On: 09/19/2023 10:38      Impression:  This 82 year old gentleman has a stable 4.3 cm fusiform ascending aortic aneurysm.  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  I do not think there is any indication for surgical treatment in this patient.  I reviewed the CTA images with him and answered all of his questions.  I stressed the importance of continued good blood pressure control in preventing further enlargement and acute aortic dissection.  Plan:  He will return to see me in 1 year with a CTA of the chest for aortic surveillance.  I spent 15 minutes performing this established patient evaluation and > 50% of this time was spent face to face counseling and coordinating the care of this patient's aortic aneurysm.    Alleen Borne, MD [facility] [phone]

## 2023-10-02 ENCOUNTER — Telehealth: Payer: Self-pay

## 2023-10-02 ENCOUNTER — Other Ambulatory Visit: Payer: Self-pay

## 2023-10-02 DIAGNOSIS — I480 Paroxysmal atrial fibrillation: Secondary | ICD-10-CM

## 2023-11-10 ENCOUNTER — Encounter: Payer: Self-pay | Admitting: Cardiology

## 2023-11-11 ENCOUNTER — Other Ambulatory Visit: Payer: Self-pay

## 2023-11-11 ENCOUNTER — Emergency Department (HOSPITAL_BASED_OUTPATIENT_CLINIC_OR_DEPARTMENT_OTHER): Payer: Medicare Other

## 2023-11-11 ENCOUNTER — Emergency Department (HOSPITAL_BASED_OUTPATIENT_CLINIC_OR_DEPARTMENT_OTHER)
Admission: EM | Admit: 2023-11-11 | Discharge: 2023-11-11 | Disposition: A | Discharge status: Home / Self Care | Payer: Medicare Other

## 2023-11-11 ENCOUNTER — Encounter (HOSPITAL_BASED_OUTPATIENT_CLINIC_OR_DEPARTMENT_OTHER): Payer: Self-pay | Admitting: Emergency Medicine

## 2023-11-11 DIAGNOSIS — R519 Headache, unspecified: Secondary | ICD-10-CM | POA: Diagnosis not present

## 2023-11-11 DIAGNOSIS — I2510 Atherosclerotic heart disease of native coronary artery without angina pectoris: Secondary | ICD-10-CM | POA: Insufficient documentation

## 2023-11-11 DIAGNOSIS — Z7902 Long term (current) use of antithrombotics/antiplatelets: Secondary | ICD-10-CM | POA: Insufficient documentation

## 2023-11-11 DIAGNOSIS — R42 Dizziness and giddiness: Secondary | ICD-10-CM | POA: Insufficient documentation

## 2023-11-11 DIAGNOSIS — Z7984 Long term (current) use of oral hypoglycemic drugs: Secondary | ICD-10-CM | POA: Insufficient documentation

## 2023-11-11 DIAGNOSIS — Z79899 Other long term (current) drug therapy: Secondary | ICD-10-CM | POA: Insufficient documentation

## 2023-11-11 DIAGNOSIS — I6782 Cerebral ischemia: Secondary | ICD-10-CM | POA: Diagnosis not present

## 2023-11-11 LAB — BASIC METABOLIC PANEL
Anion gap: 7 (ref 5–15)
BUN: 29 mg/dL — ABNORMAL HIGH (ref 8–23)
CO2: 26 mmol/L (ref 22–32)
Calcium: 10.4 mg/dL — ABNORMAL HIGH (ref 8.9–10.3)
Chloride: 104 mmol/L (ref 98–111)
Creatinine, Ser: 1.72 mg/dL — ABNORMAL HIGH (ref 0.61–1.24)
GFR, Estimated: 39 mL/min — ABNORMAL LOW (ref >60)
Glucose, Bld: 133 mg/dL — ABNORMAL HIGH (ref 70–99)
Potassium: 4.6 mmol/L (ref 3.5–5.1)
Sodium: 137 mmol/L (ref 135–145)

## 2023-11-11 LAB — CBC
HCT: 48.3 % (ref 39.0–52.0)
Hemoglobin: 16.5 g/dL (ref 13.0–17.0)
MCH: 30.1 pg (ref 26.0–34.0)
MCHC: 34.2 g/dL (ref 30.0–36.0)
MCV: 88.1 fL (ref 80.0–100.0)
Platelets: 198 10*3/uL (ref 150–400)
RBC: 5.48 MIL/uL (ref 4.22–5.81)
RDW: 14 % (ref 11.5–15.5)
WBC: 6.8 10*3/uL (ref 4.0–10.5)
nRBC: 0 % (ref 0.0–0.2)

## 2023-11-11 LAB — URINALYSIS, ROUTINE W REFLEX MICROSCOPIC
Bacteria, UA: NONE SEEN
Bilirubin Urine: NEGATIVE
Glucose, UA: >1000 mg/dL — AB
Hgb urine dipstick: NEGATIVE
Ketones, ur: NEGATIVE mg/dL
Leukocytes,Ua: NEGATIVE
Nitrite: NEGATIVE
Protein, ur: NEGATIVE mg/dL
Specific Gravity, Urine: 1.025 (ref 1.005–1.030)
pH: 5.5 (ref 5.0–8.0)

## 2023-11-11 MED ORDER — SODIUM CHLORIDE 0.9 % IV BOLUS
1000.0000 mL | Freq: Once | INTRAVENOUS | Status: AC
  Administered 2023-11-11: 1000 mL via INTRAVENOUS

## 2023-11-11 MED ORDER — MECLIZINE HCL 25 MG PO TABS
12.5000 mg | ORAL_TABLET | Freq: Once | ORAL | Status: AC
  Administered 2023-11-11: 12.5 mg via ORAL
  Filled 2023-11-11: qty 1

## 2023-11-11 MED ORDER — MECLIZINE HCL 12.5 MG PO TABS: 12.5000 mg | ORAL_TABLET | Freq: Three times a day (TID) | ORAL | 0 refills | Status: DC | PRN

## 2023-11-11 NOTE — ED Triage Notes (Signed)
Dizziness, balance issues x couple weeks. Seen at [facility] (a few times) EKG different then  few years ago (recent MI in August)  Symptoms come and go worse in morning Worse when getting up fast

## 2023-11-11 NOTE — Discharge Instructions (Signed)
Your workup today was reassuring overall.  Did appear that your kidney function was a little worse than last time.  You were given fluids for this.  Please drink plenty of fluids at home and follow-up with your doctor to have this rechecked.  Take the meclizine as needed for dizziness.  Follow-up with your doctor.  Return to the ER for worsening symptoms.

## 2023-11-11 NOTE — ED Provider Notes (Signed)
[place] EMERGENCY DEPARTMENT AT [facility] Provider Note   CSN: [id number] Arrival date & time: 11/11/23  1521     History  Chief Complaint  Patient presents with   Dizziness    Garrett SCARCE Sr. is a 83 y.o. male.  82 year old male with past medical history of atrial fibrillation, vertigo, and coronary artery disease presenting to the emergency department today with disequilibrium.  Patient states this been going now for the past 2 weeks or so.  He states that he has been doing the Epley maneuver at home because he does have a history of vertigo and this does not seem to be helping.  He states that he mostly feels some disequilibrium does not have any severe room spinning dizziness.  He denies any chest pain or shortness of breath.  He denies starting any new medications.  He denies any fevers, chills, cough, congestion, or other infectious symptoms.  Came to the ER today due to these ongoing symptoms.   Dizziness      Home Medications Prior to Admission medications   Medication Sig Start Date End Date Taking? Authorizing Provider  meclizine (ANTIVERT) 12.5 MG tablet Take 1 tablet (12.5 mg total) by mouth 3 (three) times daily as needed for dizziness. 11/11/23  Yes Durwin Glaze, MD  acetaminophen (TYLENOL) 650 MG CR tablet Take 1,950 mg by mouth 2 (two) times daily as needed for pain.    [provider]  allopurinol (ZYLOPRIM) 300 MG tablet Take 300 mg by mouth See admin instructions. Take 300 mg by mouth every other evening    [provider]  amLODipine (NORVASC) 10 MG tablet TAKE 1 TABLET BY MOUTH DAILY 08/21/23   Jake Bathe, MD  Ascorbic Acid (VITAMIN C) 500 MG CAPS Take 500 mg by mouth every evening.    [provider]  atorvastatin (LIPITOR) 40 MG tablet TAKE 1 TABLET BY MOUTH EVERY DAY 06/20/23   Barrett, Joline Salt, PA-C  carvedilol (COREG) 6.25 MG tablet Take 1 tablet (6.25 mg total) by mouth 2 (two) times daily. 02/21/23   Swinyer,  Zachary George, NP  Cholecalciferol (VITAMIN D3) 2000 UNITS TABS Take 2,000 Units by mouth daily.     [provider]  clonazePAM (KLONOPIN) 1 MG tablet Take 0.5 mg by mouth See admin instructions. Take 0.5 mg by mouth at bedtime and an additional 0.5 mg once a day as needed for anxiety    [provider]  clopidogrel (PLAVIX) 75 MG tablet TAKE 1 TABLET BY MOUTH DAILY 09/12/23   Jake Bathe, MD  Coenzyme Q10 300 MG CAPS Take 300 mg by mouth in the morning.    [provider]  dapagliflozin propanediol (FARXIGA) 10 MG TABS tablet Take 1 tablet (10 mg total) by mouth daily before breakfast. 09/25/23   Shamleffer, Konrad Dolores, MD  ELIQUIS 5 MG TABS tablet TAKE 1 TABLET BY MOUTH TWICE A DAY 09/24/23   Jake Bathe, MD  ezetimibe (ZETIA) 10 MG tablet TAKE 1 TABLET BY MOUTH DAILY 04/17/23   Orbie Pyo, MD  fenofibrate micronized (LOFIBRA) 200 MG capsule TAKE 1 CAPSULE BY MOUTH DAILY  BEFORE BREAKFAST Patient taking differently: Take 200 mg by mouth daily before breakfast. 12/18/22   Shamleffer, Konrad Dolores, MD  fluorouracil (EFUDEX) 5 % cream Apply 1 Application topically See admin instructions. Apply to affected area of the face 2 times a day AS DIRECTED    [provider]  fluticasone (FLONASE) 50 MCG/ACT nasal  spray Place 2 sprays into both nostrils daily as needed for allergies.    [provider]  glipiZIDE (GLUCOTROL) 5 MG tablet Take 1 tablet (5 mg total) by mouth 2 (two) times daily before a meal. 09/25/23   Shamleffer, Konrad Dolores, MD  glucose blood (ACCU-CHEK GUIDE) test strip USE TO TEST TWICE DAILY 06/11/23   Shamleffer, Konrad Dolores, MD  glucose blood (ACCU-CHEK GUIDE) test strip as directed In Vitro/ DX E11.9 test blood sugars daily for 90 days    [provider]  isosorbide mononitrate (IMDUR) 30 MG 24 hr tablet Take 1 tablet (30 mg total) by mouth daily. 12/06/22   Jake Bathe, MD  levothyroxine (SYNTHROID, LEVOTHROID)  175 MCG tablet Take 175 mcg by mouth daily before breakfast.    [provider]  lidocaine (ASPERCREME LIDOCAINE) 4 % Place 1 patch onto the skin daily as needed (pain).    [provider]  loratadine (CLARITIN) 10 MG tablet Take 10 mg by mouth at bedtime.    [provider]  magnesium oxide (MAG-OX) 400 (240 Mg) MG tablet Take 400 mg by mouth every evening.    [provider]  Multiple Vitamins-Minerals (PRESERVISION AREDS 2) CAPS Take 1 capsule by mouth 2 (two) times daily.    [provider]  nitroGLYCERIN (NITROSTAT) 0.4 MG SL tablet Place 1 tablet (0.4 mg total) under the tongue every 5 (five) minutes as needed for chest pain. 06/17/22   Barrett, Joline Salt, PA-C  olmesartan (BENICAR) 40 MG tablet Take 1 tablet (40 mg total) by mouth daily. Patient taking differently: Take 40 mg by mouth every evening. 03/05/23   Jake Bathe, MD  pantoprazole (PROTONIX) 40 MG tablet TAKE 1 TABLET BY MOUTH DAILY 09/03/23   Jake Bathe, MD  Plant Sterols and Stanols (CHOLESTOFF PLUS PO) Take 1 capsule by mouth daily.    [provider]  Polyethyl Glycol-Propyl Glycol (SYSTANE OP) Place 1 drop into both eyes daily as needed (dry/irritated eyes).    [provider]  tamsulosin (FLOMAX) 0.4 MG CAPS capsule Take 1 capsule (0.4 mg total) by mouth daily. 05/05/23   Meredeth Ide, MD  triamcinolone cream (KENALOG) 0.1 % Apply 1 application  topically daily as needed (rash). 02/07/18   [provider]  TURMERIC PO Take 2 tablets by mouth See admin instructions. Chew 2 gummies by mouth two times a week    [provider]  VASCEPA 1 g capsule TAKE 2 CAPSULES BY MOUTH TWICE A DAY 04/06/23   Jake Bathe, MD      Allergies    Quinolones, Ace inhibitors, Crestor [rosuvastatin], and Nitroglycerin    Review of Systems   Review of Systems  Neurological:  Positive for dizziness.  All other systems reviewed and are negative.   Physical  Exam Updated Vital Signs BP (!) 150/92 (BP Location: Right Arm)   Pulse 70   Temp 98 F (36.7 C) (Oral)   Resp 18   SpO2 97%  Physical Exam Vitals and nursing note reviewed.   Gen: NAD Eyes: PERRL, EOMI HEENT: no oropharyngeal swelling Neck: trachea midline Resp: clear to auscultation bilaterally Card: RRR, no murmurs, rubs, or gallops Abd: nontender, nondistended Extremities: no calf tenderness, no edema Vascular: 2+ radial pulses bilaterally, 2+ DP pulses bilaterally Neuro: Cranial nerves intact, equal strength sensation throughout bilateral upper and lower extremities, no dysmetria on finger-nose testing, no appreciable horizontal or vertical nystagmus noted Skin: no rashes Psyc: acting appropriately  ED Results / Procedures / Treatments   Labs (all labs ordered are listed, but only abnormal results are displayed) Labs Reviewed  BASIC METABOLIC PANEL - Abnormal; Notable for the following components:      Result Value   Glucose, Bld 133 (*)    BUN 29 (*)    Creatinine, Ser 1.72 (*)    Calcium 10.4 (*)    GFR, Estimated 39 (*)    All other components within normal limits  URINALYSIS, ROUTINE W REFLEX MICROSCOPIC - Abnormal; Notable for the following components:   Glucose, UA >1,000 (*)    All other components within normal limits  CBC  CBG MONITORING, ED    EKG EKG Interpretation Date/Time:  Sunday November 11 2023 15:56:28 EST Ventricular Rate:  77 PR Interval:    QRS Duration:  158 QT Interval:  426 QTC Calculation: 482 R Axis:   106  Text Interpretation: Atrial fibrillation with premature ventricular or aberrantly conducted complexes Right bundle branch block Abnormal ECG When compared with ECG of 10-Aug-2023 09:05, Questionable change in QRS axis Borderline criteria for Inferior infarct are no longer Present Similar in morphology tp previous EKG with more PVCs Confirmed by Beckey Downing [phone]) on 11/11/2023 4:08:34 PM  Radiology CT Head Wo  Contrast Result Date: 11/11/2023 CLINICAL DATA:  Headache, increasing frequency or severity EXAM: CT HEAD WITHOUT CONTRAST TECHNIQUE: Contiguous axial images were obtained from the base of the skull through the vertex without intravenous contrast. RADIATION DOSE REDUCTION: This exam was performed according to the departmental dose-optimization program which includes automated exposure control, adjustment of the mA and/or kV according to patient size and/or use of iterative reconstruction technique. COMPARISON:  None Available. FINDINGS: Brain: No evidence of acute infarction, hemorrhage, hydrocephalus, extra-axial collection or mass lesion/mass effect. Scattered low-density changes within the periventricular and subcortical white matter most compatible with chronic microvascular ischemic change. Mild diffuse cerebral volume loss. Vascular: Atherosclerotic calcifications involving the large vessels of the skull base. No unexpected hyperdense vessel. Skull: Normal. Negative for fracture or suspicious lesion. Incidentally noted benign bone island in the left frontal bone. Sinuses/Orbits: No acute finding. Other: None. IMPRESSION: 1. No acute intracranial findings. 2. Mild chronic microvascular ischemic change and cerebral volume loss. Electronically Signed   By: Duanne Guess D.O.   On: 11/11/2023 17:37    Procedures Procedures    Medications Ordered in ED Medications  sodium chloride 0.9 % bolus 1,000 mL ( Intravenous Stopped 11/11/23 1930)  meclizine (ANTIVERT) tablet 12.5 mg (12.5 mg Oral Given 11/11/23 1738)    ED Course/ Medical Decision Making/ A&P                                 Medical Decision Making 82 year old male with past medical history of vertigo, coronary artery disease, and atrial fibrillation presenting to the emergency department today with dizziness.  I will further evaluate patient here with basic labs to out for anemia or electrolyte abnormalities.  Will obtain EKG to eval  for conduction disturbances.  Also obtain CT scan patient's head to evaluate weight for intracranial hemorrhage or mass lesion.  I will give patient meclizine here in the event that this is due to vertigo.  I will reevaluate for ultimate disposition.  The patient's EKG interpreted by me shows atrial fibrillation with PVCs.  This does appear relatively similar in morphology to his previous EKGs.  The patient's labs here are reassuring with exception of  mildly elevated creatinine compared to baseline.  Patient given IV fluids for this.  His CT scan did not show any acute findings.  The patient states that he was feeling better after the fluids and meclizine.  We did discuss transfer to [facility] for MRI since he is still having some mild dizziness.  Ultimately, through shared decision making he would like to go home which I do think is reasonable given his history of vertigo.  I did explain to the patient that CVA is still a possibility although with his intermittent symptoms I think this is less likely.  We also discussed possible admission for IV fluids given his mild acute kidney injury.  He does have decision-making past that he and does not want stay in the hospital.  Is ultimately discharged through shared decision making with return precautions.  Amount and/or Complexity of Data Reviewed Labs: ordered. Radiology: ordered.           Final Clinical Impression(s) / ED Diagnoses Final diagnoses:  Dizziness    Rx / DC Orders ED Discharge Orders          Ordered    meclizine (ANTIVERT) 12.5 MG tablet  3 times daily PRN        11/11/23 2217              Durwin Glaze, MD 11/11/23 2307

## 2023-11-11 NOTE — ED Notes (Signed)
Patient transported to CT 

## 2023-11-12 DIAGNOSIS — R42 Dizziness and giddiness: Secondary | ICD-10-CM | POA: Diagnosis not present

## 2023-11-12 MED ORDER — ISOSORBIDE MONONITRATE ER 30 MG PO TB24: 30.0000 mg | ORAL_TABLET | Freq: Every day | ORAL | 2 refills | Status: DC

## 2023-11-20 ENCOUNTER — Other Ambulatory Visit: Payer: Self-pay | Admitting: Family Medicine

## 2023-11-20 DIAGNOSIS — N183 Chronic kidney disease, stage 3 unspecified: Secondary | ICD-10-CM | POA: Diagnosis not present

## 2023-11-20 DIAGNOSIS — R42 Dizziness and giddiness: Secondary | ICD-10-CM | POA: Diagnosis not present

## 2023-11-20 DIAGNOSIS — E1122 Type 2 diabetes mellitus with diabetic chronic kidney disease: Secondary | ICD-10-CM | POA: Diagnosis not present

## 2023-11-20 DIAGNOSIS — I10 Essential (primary) hypertension: Secondary | ICD-10-CM | POA: Diagnosis not present

## 2023-11-27 NOTE — Telephone Encounter (Signed)
 Discussed with Dr. Laneta Simmers. The patient is cleared from TCT standpoint to proceed with [facility].

## 2023-11-28 ENCOUNTER — Ambulatory Visit: Payer: Medicare Other | Admitting: Cardiology

## 2023-12-13 ENCOUNTER — Ambulatory Visit
Admission: RE | Admit: 2023-12-13 | Discharge: 2023-12-13 | Disposition: A | Discharge status: Home / Self Care | Payer: Medicare Other | Source: Ambulatory Visit | Attending: Family Medicine | Admitting: Family Medicine

## 2023-12-13 DIAGNOSIS — R42 Dizziness and giddiness: Secondary | ICD-10-CM | POA: Diagnosis not present

## 2023-12-13 DIAGNOSIS — R9082 White matter disease, unspecified: Secondary | ICD-10-CM | POA: Diagnosis not present

## 2023-12-17 ENCOUNTER — Ambulatory Visit: Payer: Medicare Other | Attending: Cardiology | Admitting: Cardiology

## 2023-12-17 VITALS — BP 138/72 | HR 70 | Ht 69.0 in | Wt 190.6 lb

## 2023-12-17 DIAGNOSIS — D6869 Other thrombophilia: Secondary | ICD-10-CM | POA: Diagnosis not present

## 2023-12-17 DIAGNOSIS — I25118 Atherosclerotic heart disease of native coronary artery with other forms of angina pectoris: Secondary | ICD-10-CM

## 2023-12-17 DIAGNOSIS — Z01812 Encounter for preprocedural laboratory examination: Secondary | ICD-10-CM

## 2023-12-17 DIAGNOSIS — I10 Essential (primary) hypertension: Secondary | ICD-10-CM | POA: Diagnosis not present

## 2023-12-17 DIAGNOSIS — I480 Paroxysmal atrial fibrillation: Secondary | ICD-10-CM | POA: Diagnosis not present

## 2023-12-17 DIAGNOSIS — Z951 Presence of aortocoronary bypass graft: Secondary | ICD-10-CM | POA: Diagnosis not present

## 2023-12-17 LAB — CBC

## 2023-12-17 NOTE — Progress Notes (Signed)
[facility]                                     Cardiology Office Note:    Date:  12/17/2023   ID:  Garrett FLENNER Sr., DOB June 11, 1941, MRN [id number]  PCP:  [name], MD  [facility] Cardiologist:  Donato Schultz, MD  [facility] Electrophysiologist:  Lanier Prude, MD   Referring MD: [name], MD   Chief Complaint  Patient presents with   pre LAAO    History of Present Illness:    Garrett Denker. is a 83 y.o. male with a hx of CAD s/p CABG, kidney stones, and atrial fibrillation who was referred for LAAO consideration given bleeding complications with hematuria.   He was seen in consultation and was felt to be a good candidate. Pre LAAO CT with anatomy suitable to proceed.   Today he reports he has been well with no chest pain, SOB, palpitations, LE edema, orthopnea, PND, dizziness, or syncope. Denies bleeding in stool or urine.    Past Medical History:  Diagnosis Date   AAA (abdominal aortic aneurysm) (HCC)    Arthritis    knees   Atrial fibrillation (HCC)    CAD (coronary artery disease)    Cancer (HCC)    prostate   CKD (chronic kidney disease), stage III (HCC) 06/18/2022   COVID    06/2019 and in 2022 states they were mild cases   Diabetes mellitus without complication (HCC)    Dyspnea    once in a while   Dysrhythmia    A-fib pt is on Eliquis   GERD (gastroesophageal reflux disease)    Gout    History of kidney stones    Hyperlipidemia    Hypertension    Hypothyroidism    Myocardial infarction (HCC) 01/1991   sp inferior   Nerve compression    right leg   Reflux    Seasonal allergies    Thoracic ascending aortic aneurysm (HCC)    Thyroid disease    hypothyroidism    Past Surgical History:  Procedure Laterality Date   ABDOMINAL AORTAGRAM N/A 02/28/2012   Procedure: ABDOMINAL Ronny Flurry;  Surgeon: Nada Libman, MD;  Location: [facility] CATH LAB;  Service: Cardiovascular;  Laterality: N/A;   abdominal aortagram embolization   02/28/2012   ABDOMINAL AORTIC ANEURYSM REPAIR  07/14/2012   BIOPSY  06/28/2023   Procedure: BIOPSY;  Surgeon: Charlott Rakes, MD;  Location: [facility] ENDOSCOPY;  Service: Gastroenterology;;   CARDIAC CATHETERIZATION     CATARACT EXTRACTION Bilateral 2016   COLONOSCOPY WITH PROPOFOL N/A 06/28/2023   Procedure: COLONOSCOPY WITH PROPOFOL;  Surgeon: Charlott Rakes, MD;  Location: [facility] ENDOSCOPY;  Service: Gastroenterology;  Laterality: N/A;   CORONARY ARTERY BYPASS GRAFT  04/13/2009   CORONARY BALLOON ANGIOPLASTY N/A 06/15/2022   Procedure: CORONARY BALLOON ANGIOPLASTY;  Surgeon: Tonny Bollman, MD;  Location: [facility] INVASIVE CV LAB;  Service: Cardiovascular;  Laterality: N/A;   CORONARY STENT PLACEMENT  1992 and  2009   RCA   CYSTOSCOPY W/ URETERAL STENT PLACEMENT Right 04/24/2023   Procedure: CYSTOSCOPY WITH RETROGRADE PYELOGRAM/URETERAL STENT PLACEMENT;  Surgeon: Crist Fat, MD;  Location: [facility] OR;  Service: Urology;  Laterality: Right;   CYSTOSCOPY/URETEROSCOPY/HOLMIUM LASER/STENT PLACEMENT Right 05/03/2023   Procedure: CYSTOSCOPY/URETEROSCOPY/HOLMIUM LASER/STONE EXTRACTION/STENT REMOVAL, FULGURATION;  Surgeon: Marcine Matar, MD;  Location: [facility] ORS;  Service: Urology;  Laterality: Right;  1 HR   EMBOLIZATION Right 02/28/2012   Procedure: EMBOLIZATION;  Surgeon: Nada Libman, MD;  Location: [facility] CATH LAB;  Service: Cardiovascular;  Laterality: Right;   IR GENERIC HISTORICAL  12/05/2016   IR RADIOLOGIST EVAL & MGMT 12/05/2016 Malachy Moan, MD [facility] INTERV RAD   IR RADIOLOGIST EVAL & MGMT  07/10/2019   IR RADIOLOGIST EVAL & MGMT  08/25/2020   IR RADIOLOGIST EVAL & MGMT  08/16/2021   IR RADIOLOGIST EVAL & MGMT  08/31/2022   KIDNEY STONE SURGERY     LAMINECTOMY WITH POSTERIOR LATERAL ARTHRODESIS LEVEL 1 Bilateral 11/10/2022   Procedure: Lumbar Two-Lumbar Three laminectomy  - bilateral with posterior lateral fusion and interspinous plating;  Surgeon: Tia Alert, MD;  Location: [facility] OR;   Service: Neurosurgery;  Laterality: Bilateral;   LEFT HEART CATH AND CORONARY ANGIOGRAPHY N/A 06/15/2022   Procedure: LEFT HEART CATH AND CORONARY ANGIOGRAPHY;  Surgeon: Tonny Bollman, MD;  Location: [facility] INVASIVE CV LAB;  Service: Cardiovascular;  Laterality: N/A;   LEFT HEART CATHETERIZATION WITH CORONARY/GRAFT ANGIOGRAM N/A 01/08/2015   Procedure: LEFT HEART CATHETERIZATION WITH Isabel Caprice;  Surgeon: Othella Boyer, MD;  Location: [facility] CATH LAB;  Service: Cardiovascular;  Laterality: N/A;   parathyroid adenoma  1981   surgery   POLYPECTOMY  06/28/2023   Procedure: POLYPECTOMY;  Surgeon: Charlott Rakes, MD;  Location: [facility] ENDOSCOPY;  Service: Gastroenterology;;   PROSTATE SURGERY     rad seeds   SPINE SURGERY  2006   TONSILLECTOMY  1955    Current Medications: Current Meds  Medication Sig   acetaminophen (TYLENOL) 650 MG CR tablet Take 1,950 mg by mouth 2 (two) times daily as needed for pain.   allopurinol (ZYLOPRIM) 300 MG tablet Take 300 mg by mouth See admin instructions. Take 300 mg by mouth every other evening   amLODipine (NORVASC) 10 MG tablet TAKE 1 TABLET BY MOUTH DAILY   Ascorbic Acid (VITAMIN C) 500 MG CAPS Take 500 mg by mouth every evening.   atorvastatin (LIPITOR) 40 MG tablet TAKE 1 TABLET BY MOUTH EVERY DAY   carvedilol (COREG) 6.25 MG tablet Take 1 tablet (6.25 mg total) by mouth 2 (two) times daily.   Cholecalciferol (VITAMIN D3) 2000 UNITS TABS Take 2,000 Units by mouth daily.    clonazePAM (KLONOPIN) 1 MG tablet Take 0.5 mg by mouth See admin instructions. Take 0.5 mg by mouth at bedtime and an additional 0.5 mg once a day as needed for anxiety   clopidogrel (PLAVIX) 75 MG tablet TAKE 1 TABLET BY MOUTH DAILY   Coenzyme Q10 300 MG CAPS Take 300 mg by mouth in the morning.   dapagliflozin propanediol (FARXIGA) 10 MG TABS tablet Take 1 tablet (10 mg total) by mouth daily before breakfast.   ELIQUIS 5 MG TABS tablet TAKE 1 TABLET BY MOUTH TWICE A DAY    ezetimibe (ZETIA) 10 MG tablet TAKE 1 TABLET BY MOUTH DAILY   fenofibrate micronized (LOFIBRA) 200 MG capsule TAKE 1 CAPSULE BY MOUTH DAILY  BEFORE BREAKFAST (Patient taking differently: Take 200 mg by mouth daily before breakfast.)   fluorouracil (EFUDEX) 5 % cream Apply 1 Application topically See admin instructions. Apply to affected area of the face 2 times a day AS DIRECTED   fluticasone (FLONASE) 50 MCG/ACT nasal spray Place 2 sprays into both nostrils daily as needed for allergies.   glipiZIDE (GLUCOTROL) 5 MG tablet Take 1 tablet (5 mg total) by mouth 2 (two) times daily before a meal.  glucose blood (ACCU-CHEK GUIDE) test strip USE TO TEST TWICE DAILY   isosorbide mononitrate (IMDUR) 30 MG 24 hr tablet Take 1 tablet (30 mg total) by mouth daily.   levothyroxine (SYNTHROID, LEVOTHROID) 175 MCG tablet Take 175 mcg by mouth daily before breakfast.   lidocaine (ASPERCREME LIDOCAINE) 4 % Place 1 patch onto the skin daily as needed (pain).   loratadine (CLARITIN) 10 MG tablet Take 10 mg by mouth at bedtime.   magnesium oxide (MAG-OX) 400 (240 Mg) MG tablet Take 400 mg by mouth every evening.   meclizine (ANTIVERT) 12.5 MG tablet Take 1 tablet (12.5 mg total) by mouth 3 (three) times daily as needed for dizziness.   Multiple Vitamins-Minerals (PRESERVISION AREDS 2) CAPS Take 1 capsule by mouth 2 (two) times daily.   nitroGLYCERIN (NITROSTAT) 0.4 MG SL tablet Place 1 tablet (0.4 mg total) under the tongue every 5 (five) minutes as needed for chest pain.   olmesartan (BENICAR) 40 MG tablet Take 1 tablet (40 mg total) by mouth daily. (Patient taking differently: Take 40 mg by mouth every evening.)   pantoprazole (PROTONIX) 40 MG tablet TAKE 1 TABLET BY MOUTH DAILY   Plant Sterols and Stanols (CHOLESTOFF PLUS PO) Take 1 capsule by mouth daily.   Polyethyl Glycol-Propyl Glycol (SYSTANE OP) Place 1 drop into both eyes daily as needed (dry/irritated eyes).   tamsulosin (FLOMAX) 0.4 MG CAPS capsule Take  1 capsule (0.4 mg total) by mouth daily.   triamcinolone cream (KENALOG) 0.1 % Apply 1 application  topically daily as needed (rash).   TURMERIC PO Take 2 tablets by mouth See admin instructions. Chew 2 gummies by mouth two times a week   VASCEPA 1 g capsule TAKE 2 CAPSULES BY MOUTH TWICE A DAY     Allergies:   Quinolones, Ace inhibitors, Crestor [rosuvastatin], and Nitroglycerin   Social History   Socioeconomic History   Marital status: Divorced    Spouse name: Not on file   Number of children: Not on file   Years of education: 14   Highest education level: Associate degree: academic program  Occupational History   Occupation: Retired  Tobacco Use   Smoking status: Former    Current packs/day: 0.00    Types: Cigarettes    Quit date: 08/01/1970    Years since quitting: 53.4   Smokeless tobacco: Never  Vaping Use   Vaping status: Never Used  Substance and Sexual Activity   Alcohol use: Yes    Alcohol/week: 6.0 standard drinks of alcohol    Types: 3 Glasses of wine, 3 Cans of beer per week    Comment: 1-3 glasses 3-4 times a week   Drug use: No   Sexual activity: Not Currently  Other Topics Concern   Not on file  Social History Narrative   Not on file   Social Drivers of Health   Financial Resource Strain: Not on file  Food Insecurity: No Food Insecurity (04/27/2023)   Hunger Vital Sign    Worried About Running Out of Food in the Last Year: Never true    Ran Out of Food in the Last Year: Never true  Transportation Needs: No Transportation Needs (04/27/2023)   PRAPARE - Administrator, Civil Service (Medical): No    Lack of Transportation (Non-Medical): No  Physical Activity: Not on file  Stress: Not on file  Social Connections: Not on file     Family History: The patient's family history includes Deep vein thrombosis in his mother;  Diabetes in his maternal grandmother, sister, and sister; Heart attack in his father and sister; Heart disease in his father,  paternal uncle, and sister; Hyperlipidemia in his father and sister; Hypertension in his father and sister; Stroke in his mother.  ROS:   Please see the history of present illness.    All other systems reviewed and are negative.  EKGs/Labs/Other Studies Reviewed:    The following studies were reviewed today:   Cardiac Studies & Procedures   CARDIAC CATHETERIZATION  CARDIAC CATHETERIZATION 06/15/2022  Narrative 1.  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  Status post CABG with continued patency of the LIMA to LAD, saphenous vein graft to acute marginal, and saphenous vein graft to PDA with extensive diffuse degenerative changes. 3.  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  Acute occlusion of the saphenous vein graft to left posterolateral branch with extensive degenerative changes and unsuccessful PCI due to inability to restore TIMI-3 flow in an extensively degenerated graft 5.  Normal LVEDP  Plan: Patient loaded with clopidogrel 600 mg prior to the procedure, would continue at least 3 months and he will need to delay his planned back surgery for this duration.  Resume apixaban tomorrow morning.  Check 2D echocardiogram.  Post MI CV-ICU care, anticipate hospital discharge in 48 hours if no complications arise.  Findings Coronary Findings Diagnostic  Dominance: Right  Left Anterior Descending Ost LAD to Prox LAD lesion is 100% stenosed.  Left Circumflex Mid Cx lesion is 100% stenosed.  First Obtuse Marginal Branch The circumflex/intermediate branch is patent and unchanged from the previous cardiac catheterization study in 2016.  The AV circumflex just beyond the high OM/intermediate is occluded.  Right Coronary Artery Prox RCA to Mid RCA lesion is 100% stenosed. The lesion was previously treated . The  RCA is subtotally occluded in the mid vessel within the previously implanted stent  LIMA LIMA Graft To Mid LAD LIMA.  The LIMA to mid LAD graft is widely patent.  There is a remaining proximal branch to the chest wall.  The graft fills the LAD and also retrograde fills the first diagonal branch.  The mid and distal LAD appear to be diffusely diseased.  Saphenous Graft To Acute Mrg SVG.  The saphenous vein graft to acute marginal branch is patent and unchanged from the prior procedure.  The graft is small.  Graft To 1st RPL Origin to Prox Graft lesion is 100% stenosed. This graft is chronically occluded  Saphenous Graft To RPDA SVG.  The graft exhibits severe . The SVG to PDA is severely degenerated.  There are aneurysmal segments and areas of moderate stenosis in the mid and distal body of the graft.  Flow is slow through the graft.  Saphenous Graft To 3rd Mrg SVG.  The saphenous vein graft to the left posterolateral branch is totally occluded with contrast staining in the mid body of the graft and diffuse degenerated changes throughout the proximal and mid body of the graft.  Contrast stains in a pattern of acute occlusion. Prox Graft to Mid Graft lesion is 100% stenosed.  Intervention  Prox Graft to Mid Graft lesion (Saphenous Graft To 3rd Mrg) Angioplasty CATHETER LAUNCHER 6FR AL2 guide catheter was inserted. WIRE HI TORQ WHISPER MS 190CM guidewire used to cross lesion. Balloon angioplasty was performed using a BALLN  SAPPHIRE 2.0X15. Maximum pressure: 12 atm. The graft is cannulated with an AL 2 guide catheter.  Verapamil is administered throughout the PCI procedure.  Additional heparin is administered to achieve a therapeutic ACT.  A cougar wire would not cross the lesion.  A whisper wire successfully navigated the lesion but was difficult to wire into the native posterolateral branch.  Ultimately this was done successfully and the graft was extensively dilated with a 2.0 x 15 mm balloon  throughout the mid and distal portion all the way to the distal anastomosis.  Even with extensive balloon dilatation and administration of intracoronary verapamil, flow remained TIMI I with extensive degenerative changes throughout the mid and distal body of the bypass graft.  Due to the patient&#39;s chronic kidney disease, relatively limited infarct involving a posterolateral branch of the circumflex, and very unlikely chance for successful reperfusion with extensive vein graft stenting, I elected to stop the procedure after balloon angioplasty in order to minimize contrast and reduce the patient&#39;s risk of distal embolization with a low likelihood of procedural success. Post-Intervention Lesion Assessment The intervention was unsuccessful. Pre-interventional TIMI flow is 0. Post-intervention TIMI flow is 1. No complications occurred at this lesion. There is a 80% residual stenosis post intervention.   STRESS TESTS  MYOCARDIAL PERFUSION IMAGING 05/31/2022  Narrative   The study is normal. The study is low risk.   No ST deviation was noted.   Left ventricular function is normal. Nuclear stress EF: 61 %. The left ventricular ejection fraction is normal (55-65%). End diastolic cavity size is normal.   Prior study available for comparison from 03/10/2020.  ECHOCARDIOGRAM  ECHOCARDIOGRAM COMPLETE 06/15/2022  Narrative ECHOCARDIOGRAM REPORT    Patient Name:   Garrett AMICK Sr. Date of Exam: 06/15/2022 Medical Rec #:  [id number]          Height:       69.5 in Accession #:    [id number]         Weight:       203.1 lb Date of Birth:  1941-02-18          BSA:          2.090 m Patient Age:    80 years           BP:           109/72 mmHg Patient Gender: M                  HR:           62 bpm. Exam Location:  Inpatient  Procedure: 2D Echo, Cardiac Doppler and Color Doppler  Indications:    Inferior STEMI  History:        Patient has prior history of Echocardiogram examinations. CHF, CAD,  Cardiac cath 06/16/22 and Prior CABG; Arrythmias:Atrial Fibrillation.  Sonographer:    Roosvelt Maser RDCS Referring Phys: [phone] MICHAEL COOPER  IMPRESSIONS   1. Left ventricular ejection fraction, by estimation, is 55 to 60%. The left ventricle has normal function. The left ventricle demonstrates global hypokinesis. There is mild concentric left ventricular hypertrophy. Left ventricular diastolic parameters are indeterminate. 2. Right ventricular systolic function is mildly reduced. The right ventricular size is normal. 3. Left atrial size was moderately dilated. 4. Right atrial size was moderately dilated. 5. The mitral valve is normal in structure. No evidence of mitral valve regurgitation. No evidence of mitral stenosis. 6. Tricuspid valve regurgitation is moderate. 7. The aortic valve is normal in structure. Aortic valve  regurgitation is not visualized. No aortic stenosis is present. 8. Aneurysm of the ascending aorta, measuring 44 mm. There is mild dilatation of the aortic root, measuring 39 mm. 9. The inferior vena cava is normal in size with greater than 50% respiratory variability, suggesting right atrial pressure of 3 mmHg.  FINDINGS Left Ventricle: Left ventricular ejection fraction, by estimation, is 55 to 60%. The left ventricle has normal function. The left ventricle demonstrates global hypokinesis. The left ventricular internal cavity size was normal in size. There is mild concentric left ventricular hypertrophy. Left ventricular diastolic parameters are indeterminate.   LV Wall Scoring: The basal inferolateral segment and basal inferior segment are hypokinetic. The entire anterior wall, antero-lateral wall, mid and distal lateral wall, entire septum, entire apex, and mid and distal inferior wall are normal.  Right Ventricle: The right ventricular size is normal. No increase in right ventricular wall thickness. Right ventricular systolic function is mildly reduced.  Left  Atrium: Left atrial size was moderately dilated.  Right Atrium: Right atrial size was moderately dilated.  Pericardium: There is no evidence of pericardial effusion. Presence of epicardial fat layer.  Mitral Valve: The mitral valve is normal in structure. No evidence of mitral valve regurgitation. No evidence of mitral valve stenosis.  Tricuspid Valve: The tricuspid valve is normal in structure. Tricuspid valve regurgitation is moderate . No evidence of tricuspid stenosis.  Aortic Valve: The aortic valve is normal in structure. Aortic valve regurgitation is not visualized. No aortic stenosis is present.  Pulmonic Valve: The pulmonic valve was normal in structure. Pulmonic valve regurgitation is trivial. No evidence of pulmonic stenosis.  Aorta: The aortic root is normal in size and structure. There is mild dilatation of the aortic root, measuring 39 mm. There is an aneurysm involving the ascending aorta measuring 44 mm.  Venous: The inferior vena cava is normal in size with greater than 50% respiratory variability, suggesting right atrial pressure of 3 mmHg.  IAS/Shunts: No atrial level shunt detected by color flow Doppler.   LEFT VENTRICLE PLAX 2D LVIDd:         4.90 cm LVIDs:         3.90 cm LV PW:         1.20 cm LV IVS:        1.10 cm LVOT diam:     2.00 cm LV SV:         43 LV SV Index:   20 LVOT Area:     3.14 cm   RIGHT VENTRICLE RV Basal diam:  3.80 cm RV S prime:     5.48 cm/s TAPSE (M-mode): 0.9 cm  LEFT ATRIUM              Index        RIGHT ATRIUM           Index LA diam:        4.50 cm  2.15 cm/m   RA Area:     25.90 cm LA Vol (A2C):   112.0 ml 53.59 ml/m  RA Volume:   90.40 ml  43.26 ml/m LA Vol (A4C):   80.0 ml  38.28 ml/m LA Biplane Vol: 98.7 ml  47.23 ml/m AORTIC VALVE LVOT Vmax:   63.90 cm/s LVOT Vmean:  40.900 cm/s LVOT VTI:    0.136 m  AORTA Ao Root diam: 3.90 cm Ao Asc diam:  4.40 cm  TRICUSPID VALVE TR Peak grad:   29.8 mmHg TR Vmax:  273.00 cm/s  SHUNTS Systemic VTI:  0.14 m Systemic Diam: 2.00 cm  Kardie Tobb DO Electronically signed by Thomasene Ripple DO Signature Date/Time: 06/15/2022/1:26:59 PM    Final   MONITORS  LONG TERM MONITOR (3-14 DAYS) 01/15/2020  Narrative  Sinus pauses x 3, longest is 3.1 to 3.8 second during day time hours.  Sinus rhythm, occasional PVC.  Rare atrial tachycardia.  Occasional baseline artifact but no clear evidence of atrial fibrialltion  This monitor is similar to prior.  Will discontinue metoprolol given pauses. I would like for him to have an EP consult given his underlying conduction disease and potential need for pacemaker. Donato Schultz, MD           EKG:  EKG is ordered today.  The ekg ordered today demonstrates AF with SVR, HR 59bpm  Recent Labs: 04/24/2023: ALT 16 05/02/2023: Magnesium 1.9 11/11/2023: BUN 29; Creatinine, Ser 1.72; Hemoglobin 16.5; Platelets 198; Potassium 4.6; Sodium 137  Recent Lipid Panel    Component Value Date/Time   CHOL 99 (L) 08/11/2022 1057   TRIG 192 (H) 08/11/2022 1057   HDL 32 (L) 08/11/2022 1057   CHOLHDL 3.1 08/11/2022 1057   CHOLHDL 5.0 06/15/2022 0755   VLDL 74 (H) 06/15/2022 0755   LDLCALC 36 08/11/2022 1057   LDLDIRECT 67 07/15/2021 0912    Risk Assessment/Calculations:    HAS-BLED score 4 Hypertension Yes  Abnormal renal and liver function (Dialysis, transplant, Cr >2.26 mg/dL /Cirrhosis or Bilirubin >2x Normal or AST/ALT/AP >3x Normal) No  Stroke No  Bleeding Yes  Labile INR (Unstable/high INR) No  Elderly (>65) Yes  Drugs or alcohol (>= 8 drinks/week, anti-plt or NSAID) Yes    CHA2DS2-VASc Score = 5  The patient's score is based upon: CHF History: 0 HTN History: 1 Diabetes History: 1 Stroke History: 0 Vascular Disease History: 1 Age Score: 2 Gender Score: 0   Physical Exam:    VS:  BP 138/72   Pulse 70   Ht 5\' 9"  (1.753 m)   Wt 190 lb 9.6 oz (86.5 kg)   SpO2 93%   BMI 28.15 kg/m     Wt  Readings from Last 3 Encounters:  12/17/23 190 lb 9.6 oz (86.5 kg)  09/26/23 187 lb (84.8 kg)  09/25/23 194 lb (88 kg)    General: Well developed, well nourished, NAD Lungs:Clear to ausculation bilaterally. No wheezes, rales, or rhonchi. Breathing is unlabored. Cardiovascular: Irregular. No murmurs Extremities: No edema.  Neuro: Alert and oriented. No focal deficits. No facial asymmetry. MAE spontaneously. Psych: Responds to questions appropriately with normal affect.    ASSESSMENT/PLAN:    Paroxysmal atrial fibrillation: Referred for LAAO consideration given issues with recurrent hematuria while taking Eliquis and Plavix. Pre LAAO instructions reviewed with understanding. CHG soap given. Obtain CBC, BMET.   CAD: s/p CABG with no recent anginal symptoms.   HTN: Well controlled today therefore will continue cutrrent regimen.   I spent 20 minutes caring for this patient today including face-to-face discussions, ordering and reviewing labs, reviewing records from [facility] and other outside facilities, documenting in the record, and arranging for follow up.     Medication Adjustments/Labs and Tests Ordered: Current medicines are reviewed at length with the patient today.  Concerns regarding medicines are outlined above.  Orders Placed This Encounter  Procedures   CBC   Basic metabolic panel   EKG 12-Lead   No orders of the defined types were placed in this encounter.   There are no Patient  Instructions on file for this visit.   Signed, Georgie Chard, NP  12/17/2023 9:38 AM    [facility]

## 2023-12-17 NOTE — Patient Instructions (Signed)
Medication Instructions:  The current medical regimen is effective;  continue present plan and medications.  *If you need a refill on your cardiac medications before your next appointment, please call your pharmacy*   Lab Work: Please have blood work today at [facility].  (CBC, BMP)  If you have labs (blood work) drawn today and your tests are completely normal, you will receive your results only by: MyChart Message (if you have MyChart) OR A paper copy in the mail If you have any lab test that is abnormal or we need to change your treatment, we will call you to review the results.   Testing/Procedures: Watchman - please follow the instruction sheet given.   Follow-Up: At [facility], you and your health needs are our priority.  As part of our continuing mission to provide you with exceptional heart care, we have created designated Provider Care Teams.  These Care Teams include your primary Cardiologist (physician) and Advanced Practice Providers (APPs -  Physician Assistants and Nurse Practitioners) who all work together to provide you with the care you need, when you need it.  We recommend signing up for the patient portal called "MyChart".  Sign up information is provided on this After Visit Summary.  MyChart is used to connect with patients for Virtual Visits (Telemedicine).  Patients are able to view lab/test results, encounter notes, upcoming appointments, etc.  Non-urgent messages can be sent to your provider as well.   To learn more about what you can do with MyChart, go to [email or web address].    Your next appointment:   Follow up as scheduled.

## 2023-12-18 ENCOUNTER — Telehealth: Payer: Self-pay

## 2023-12-18 ENCOUNTER — Other Ambulatory Visit: Payer: Self-pay | Admitting: Internal Medicine

## 2023-12-18 LAB — BASIC METABOLIC PANEL
BUN/Creatinine Ratio: 17 (ref 10–24)
BUN: 29 mg/dL — ABNORMAL HIGH (ref 8–27)
CO2: 21 mmol/L (ref 20–29)
Calcium: 9.7 mg/dL (ref 8.6–10.2)
Chloride: 105 mmol/L (ref 96–106)
Creatinine, Ser: 1.69 mg/dL — ABNORMAL HIGH (ref 0.76–1.27)
Glucose: 116 mg/dL — ABNORMAL HIGH (ref 70–99)
Potassium: 4.8 mmol/L (ref 3.5–5.2)
Sodium: 140 mmol/L (ref 134–144)
eGFR: 40 mL/min/{1.73_m2} — ABNORMAL LOW (ref >59)

## 2023-12-18 LAB — CBC
Hematocrit: 44.1 % (ref 37.5–51.0)
Hemoglobin: 14.8 g/dL (ref 13.0–17.7)
MCH: 30.3 pg (ref 26.6–33.0)
MCHC: 33.6 g/dL (ref 31.5–35.7)
MCV: 90 fL (ref 79–97)
Platelets: 191 10*3/uL (ref 150–450)
RBC: 4.88 x10E6/uL (ref 4.14–5.80)
RDW: 12.6 % (ref 11.6–15.4)
WBC: 5.7 10*3/uL (ref 3.4–10.8)

## 2023-12-18 NOTE — Telephone Encounter (Signed)
 Confirmed procedure date of 12/20/2023. Confirmed arrival time of 0900 for procedure time at 1130. Reviewed pre-procedure instructions with patient. The patient understands to call if questions/concerns arise prior to procedure.  He was grateful for call and agreed with plan.

## 2023-12-20 ENCOUNTER — Ambulatory Visit (HOSPITAL_COMMUNITY)
Admission: RE | Admit: 2023-12-20 | Discharge: 2023-12-20 | Disposition: A | Discharge status: Home / Self Care | Payer: Medicare Other | Attending: Cardiology | Admitting: Cardiology

## 2023-12-20 ENCOUNTER — Inpatient Hospital Stay (HOSPITAL_COMMUNITY): Payer: Medicare Other | Admitting: Anesthesiology

## 2023-12-20 ENCOUNTER — Inpatient Hospital Stay (HOSPITAL_COMMUNITY): Payer: Medicare Other

## 2023-12-20 ENCOUNTER — Encounter (HOSPITAL_COMMUNITY): Payer: Self-pay | Admitting: Cardiology

## 2023-12-20 ENCOUNTER — Inpatient Hospital Stay (HOSPITAL_BASED_OUTPATIENT_CLINIC_OR_DEPARTMENT_OTHER): Payer: Medicare Other | Admitting: Anesthesiology

## 2023-12-20 ENCOUNTER — Ambulatory Visit (HOSPITAL_COMMUNITY)
Admission: RE | Disposition: A | Discharge status: Home / Self Care | Payer: Medicare Other | Source: Home / Self Care | Attending: Cardiology

## 2023-12-20 DIAGNOSIS — E1159 Type 2 diabetes mellitus with other circulatory complications: Secondary | ICD-10-CM | POA: Diagnosis not present

## 2023-12-20 DIAGNOSIS — I480 Paroxysmal atrial fibrillation: Secondary | ICD-10-CM

## 2023-12-20 DIAGNOSIS — I252 Old myocardial infarction: Secondary | ICD-10-CM | POA: Insufficient documentation

## 2023-12-20 DIAGNOSIS — I083 Combined rheumatic disorders of mitral, aortic and tricuspid valves: Secondary | ICD-10-CM | POA: Insufficient documentation

## 2023-12-20 DIAGNOSIS — K219 Gastro-esophageal reflux disease without esophagitis: Secondary | ICD-10-CM | POA: Diagnosis not present

## 2023-12-20 DIAGNOSIS — I10 Essential (primary) hypertension: Secondary | ICD-10-CM | POA: Diagnosis not present

## 2023-12-20 DIAGNOSIS — I77819 Aortic ectasia, unspecified site: Secondary | ICD-10-CM | POA: Insufficient documentation

## 2023-12-20 DIAGNOSIS — Z87891 Personal history of nicotine dependence: Secondary | ICD-10-CM | POA: Diagnosis not present

## 2023-12-20 DIAGNOSIS — Z7902 Long term (current) use of antithrombotics/antiplatelets: Secondary | ICD-10-CM | POA: Diagnosis not present

## 2023-12-20 DIAGNOSIS — Z006 Encounter for examination for normal comparison and control in clinical research program: Secondary | ICD-10-CM | POA: Diagnosis not present

## 2023-12-20 DIAGNOSIS — Z955 Presence of coronary angioplasty implant and graft: Secondary | ICD-10-CM | POA: Insufficient documentation

## 2023-12-20 DIAGNOSIS — Z01818 Encounter for other preprocedural examination: Principal | ICD-10-CM

## 2023-12-20 DIAGNOSIS — I2510 Atherosclerotic heart disease of native coronary artery without angina pectoris: Secondary | ICD-10-CM | POA: Diagnosis not present

## 2023-12-20 DIAGNOSIS — I25118 Atherosclerotic heart disease of native coronary artery with other forms of angina pectoris: Secondary | ICD-10-CM | POA: Diagnosis not present

## 2023-12-20 DIAGNOSIS — Z95818 Presence of other cardiac implants and grafts: Secondary | ICD-10-CM

## 2023-12-20 DIAGNOSIS — Z87442 Personal history of urinary calculi: Secondary | ICD-10-CM | POA: Diagnosis not present

## 2023-12-20 DIAGNOSIS — E785 Hyperlipidemia, unspecified: Secondary | ICD-10-CM | POA: Diagnosis present

## 2023-12-20 DIAGNOSIS — Z7901 Long term (current) use of anticoagulants: Secondary | ICD-10-CM | POA: Diagnosis not present

## 2023-12-20 DIAGNOSIS — I714 Abdominal aortic aneurysm, without rupture, unspecified: Secondary | ICD-10-CM | POA: Diagnosis present

## 2023-12-20 DIAGNOSIS — N183 Chronic kidney disease, stage 3 unspecified: Secondary | ICD-10-CM | POA: Diagnosis not present

## 2023-12-20 DIAGNOSIS — I517 Cardiomegaly: Secondary | ICD-10-CM | POA: Diagnosis not present

## 2023-12-20 DIAGNOSIS — I700 Atherosclerosis of aorta: Secondary | ICD-10-CM | POA: Diagnosis not present

## 2023-12-20 DIAGNOSIS — E119 Type 2 diabetes mellitus without complications: Secondary | ICD-10-CM

## 2023-12-20 DIAGNOSIS — I119 Hypertensive heart disease without heart failure: Secondary | ICD-10-CM | POA: Diagnosis present

## 2023-12-20 DIAGNOSIS — I129 Hypertensive chronic kidney disease with stage 1 through stage 4 chronic kidney disease, or unspecified chronic kidney disease: Secondary | ICD-10-CM | POA: Diagnosis not present

## 2023-12-20 DIAGNOSIS — Z951 Presence of aortocoronary bypass graft: Secondary | ICD-10-CM

## 2023-12-20 DIAGNOSIS — E1122 Type 2 diabetes mellitus with diabetic chronic kidney disease: Secondary | ICD-10-CM | POA: Diagnosis not present

## 2023-12-20 DIAGNOSIS — I4891 Unspecified atrial fibrillation: Secondary | ICD-10-CM

## 2023-12-20 HISTORY — PX: LEFT ATRIAL APPENDAGE OCCLUSION: EP1229

## 2023-12-20 HISTORY — PX: TRANSESOPHAGEAL ECHOCARDIOGRAM (CATH LAB): EP1270

## 2023-12-20 LAB — SURGICAL PCR SCREEN
MRSA, PCR: NEGATIVE
Staphylococcus aureus: NEGATIVE

## 2023-12-20 LAB — POCT ACTIVATED CLOTTING TIME
Activated Clotting Time: 256 s
Activated Clotting Time: 314 s

## 2023-12-20 LAB — TYPE AND SCREEN
ABO/RH(D): O POS
Antibody Screen: NEGATIVE

## 2023-12-20 LAB — GLUCOSE, CAPILLARY
Glucose-Capillary: 137 mg/dL — ABNORMAL HIGH (ref 70–99)
Glucose-Capillary: 127 mg/dL — ABNORMAL HIGH (ref 70–99)

## 2023-12-20 LAB — ECHO TEE

## 2023-12-20 SURGERY — LEFT ATRIAL APPENDAGE OCCLUSION
Anesthesia: General

## 2023-12-20 MED ORDER — SODIUM CHLORIDE 0.9 % IV SOLN: INTRAVENOUS | Status: DC

## 2023-12-20 MED ORDER — PHENYLEPHRINE 80 MCG/ML (10ML) SYRINGE FOR IV PUSH (FOR BLOOD PRESSURE SUPPORT)
PREFILLED_SYRINGE | INTRAVENOUS | Status: DC | PRN
  Administered 2023-12-20: 80 ug via INTRAVENOUS
  Administered 2023-12-20 (×3): 120 ug via INTRAVENOUS
  Administered 2023-12-20: 160 ug via INTRAVENOUS

## 2023-12-20 MED ORDER — DEXAMETHASONE SODIUM PHOSPHATE 10 MG/ML IJ SOLN
INTRAMUSCULAR | Status: DC | PRN
  Administered 2023-12-20: 5 mg via INTRAVENOUS

## 2023-12-20 MED ORDER — HEPARIN SODIUM (PORCINE) 1000 UNIT/ML IJ SOLN
INTRAMUSCULAR | Status: DC | PRN
  Administered 2023-12-20: 13000 [IU] via INTRAVENOUS

## 2023-12-20 MED ORDER — HEPARIN SODIUM (PORCINE) 1000 UNIT/ML IJ SOLN
INTRAMUSCULAR | Status: AC
  Filled 2023-12-20: qty 10

## 2023-12-20 MED ORDER — IOHEXOL 350 MG/ML SOLN
INTRAVENOUS | Status: DC | PRN
  Administered 2023-12-20: 10 mL

## 2023-12-20 MED ORDER — CEFAZOLIN SODIUM-DEXTROSE 2-4 GM/100ML-% IV SOLN
2.0000 g | INTRAVENOUS | Status: AC
  Administered 2023-12-20: 2 g via INTRAVENOUS

## 2023-12-20 MED ORDER — FENTANYL CITRATE (PF) 250 MCG/5ML IJ SOLN
INTRAMUSCULAR | Status: DC | PRN
  Administered 2023-12-20: 100 ug via INTRAVENOUS

## 2023-12-20 MED ORDER — ROCURONIUM BROMIDE 10 MG/ML (PF) SYRINGE
PREFILLED_SYRINGE | INTRAVENOUS | Status: DC | PRN
  Administered 2023-12-20: 80 mg via INTRAVENOUS
  Administered 2023-12-20: 20 mg via INTRAVENOUS

## 2023-12-20 MED ORDER — PROPOFOL 500 MG/50ML IV EMUL
INTRAVENOUS | Status: DC | PRN
  Administered 2023-12-20: 100 ug/kg/min via INTRAVENOUS

## 2023-12-20 MED ORDER — CHLORHEXIDINE GLUCONATE 4 % EX SOLN: Freq: Once | CUTANEOUS | Status: DC

## 2023-12-20 MED ORDER — INSULIN ASPART 100 UNIT/ML IJ SOLN: 0.0000 [IU] | INTRAMUSCULAR | Status: DC | PRN

## 2023-12-20 MED ORDER — PROTAMINE SULFATE 10 MG/ML IV SOLN
INTRAVENOUS | Status: DC | PRN
  Administered 2023-12-20: 30 mg via INTRAVENOUS

## 2023-12-20 MED ORDER — PROPOFOL 10 MG/ML IV BOLUS
INTRAVENOUS | Status: DC | PRN
  Administered 2023-12-20: 130 mg via INTRAVENOUS

## 2023-12-20 MED ORDER — FENTANYL CITRATE (PF) 100 MCG/2ML IJ SOLN
INTRAMUSCULAR | Status: AC
  Filled 2023-12-20: qty 2

## 2023-12-20 MED ORDER — CHLORHEXIDINE GLUCONATE 0.12 % MT SOLN
15.0000 mL | Freq: Once | OROMUCOSAL | Status: AC
  Administered 2023-12-20: 15 mL via OROMUCOSAL

## 2023-12-20 MED ORDER — SUGAMMADEX SODIUM 200 MG/2ML IV SOLN
INTRAVENOUS | Status: DC | PRN
  Administered 2023-12-20: 200 mg via INTRAVENOUS

## 2023-12-20 MED ORDER — LIDOCAINE 2% (20 MG/ML) 5 ML SYRINGE
INTRAMUSCULAR | Status: DC | PRN
  Administered 2023-12-20: 80 mg via INTRAVENOUS

## 2023-12-20 MED ORDER — HEPARIN (PORCINE) IN NACL 1000-0.9 UT/500ML-% IV SOLN
INTRAVENOUS | Status: DC | PRN
  Administered 2023-12-20: 1000 mL

## 2023-12-20 SURGICAL SUPPLY — 22 items
BLANKET WARM UNDERBOD FULL ACC (MISCELLANEOUS) ×1 IMPLANT
CATH INFINITI 5FR ANG PIGTAIL (CATHETERS) IMPLANT
CATH NUVISION NON NAV ICE (CATHETERS) IMPLANT
CLOSURE PERCLOSE PROSTYLE (VASCULAR PRODUCTS) IMPLANT
COVER SWIFTLINK CONNECTOR (BAG) IMPLANT
DEVICE WATCHMAN FLX PRO PROC (KITS) IMPLANT
DEVICE WATCHMAN FXD CRV PROC (KITS) IMPLANT
DILATOR VESSEL 38 20CM 16FR (INTRODUCER) IMPLANT
INTRODUCER PERFORM 12 30 .038 (SHEATH) IMPLANT
KIT SHEA VERSACROSS LAAC CONNE (KITS) IMPLANT
PACK CARDIAC CATHETERIZATION (CUSTOM PROCEDURE TRAY) ×1 IMPLANT
PAD DEFIB RADIO PHYSIO CONN (PAD) ×1 IMPLANT
SHEATH PERFORMER 16FR 30 (SHEATH) IMPLANT
SHEATH PINNACLE 8F 10CM (SHEATH) IMPLANT
SHEATH PROBE COVER 6X72 (BAG) ×1 IMPLANT
SYS WATCHMAN FXD DBL (SHEATH) ×1 IMPLANT
SYSTEM WATCHMAN FXD DBL (SHEATH) IMPLANT
TRANSDUCER W/STOPCOCK (MISCELLANEOUS) ×1 IMPLANT
WATCHMAN FLX PRO 24 (Prosthesis & Implant Heart) IMPLANT
WATCHMAN FLX PRO 27 (Prosthesis & Implant Heart) IMPLANT
WATCHMAN FLX PRO PROCEDURE (KITS) ×1 IMPLANT
WATCHMAN FXD CRV SYS PROCEDURE (KITS) ×1 IMPLANT

## 2023-12-20 NOTE — H&P (Signed)
 Electrophysiology Office Note:     Date:  12/20/2023    ID:  Garrett BROADWATER Sr., DOB August 12, 1941, MRN [id number]   [facility] Cardiologist:  Oneil Parchment, MD  [facility] Electrophysiologist:  OLE ONEIDA HOLTS, MD    Referring MD: Seabron Lenis, MD    Chief Complaint: AF   History of Present Illness:         Discussed the use of AI scribe software for clinical note transcription with the patient, who gave verbal consent to proceed.   History of Present Illness   [name], an 83 year old with a history of severe coronary artery disease post-CABG, atrial fibrillation, and kidney stones, presents for evaluation of atrial fibrillation. He is currently on Eliquis  and Plavix  for his atrial fibrillation and coronary artery disease, respectively. However, he has experienced bleeding complications, including hematuria, while on Eliquis  and wishes to explore other stroke risk mitigation strategies that avoid long-term exposure to anticoagulation. He has a history of kidney stones, which required a procedure to break them up. This procedure was delayed due to the need to stop his blood thinners, resulting in an eight-day hospital stay. He also experienced significant hematuria following this procedure, which he attributes to his blood thinners.      Presents for LAAO today. Procedure reviewed.             Objective Their past medical, social and family history was reveiwed.     ROS:   Please see the history of present illness.    All other systems reviewed and are negative.   EKGs/Labs/Other Studies Reviewed:     The following studies were reviewed today:   06/16/2023 Echo EF 55 RV mildly reduced Dilated LA/RA Ascending aorta 44mm            Physical Exam:     VS:  BP 144/99 (BP Location: Left Arm, Patient Position: Sitting, Cuff Size: Normal) Comment: hasn't taken all medication this am  Pulse 69   Ht 5' 9 (1.753 m)   Wt 190 lb 9.6 oz (86.5 kg)   SpO2 97%   BMI  28.15 kg/m         Wt Readings from Last 3 Encounters:  09/12/23 190 lb 9.6 oz (86.5 kg)  08/10/23 190 lb 6.4 oz (86.4 kg)  06/28/23 185 lb (83.9 kg)      GEN:  Well nourished, well developed in no acute distress CARDIAC: RRR, no murmurs, rubs, gallops RESPIRATORY:  Clear to auscultation without rales, wheezing or rhonchi      Assessment ASSESSMENT AND PLAN:     1. Paroxysmal atrial fibrillation (HCC)   2. Coronary artery disease of native artery of native heart with stable angina pectoris (HCC)   3. S/P CABG (coronary artery bypass graft)   4. Hypertension associated with diabetes (HCC)         Assessment and Plan    Atrial Fibrillation History of bleeding complications on Eliquis . Discussed the risks/benefits of left atrial appendage occlusion vs anticoagulation. Patient wishes to proceed with left atrial appendage occlusion. -Schedule left atrial appendage occlusion procedure. -Continue Eliquis  until procedure.   Coronary Artery Disease Severe, post-CABG. No ischemic symptoms today. -Continue Plavix .   Hypertension Controlled. -Continue amlodipine , Coreg , and olmesartan .   Hematuria History of kidney stones and significant bleeding during prior procedures. -Monitor closely, especially in the context of anticoagulation therapy.         -----------------   I have seen Garrett Garrett Bold Sr. in the office  today who is being considered for a Watchman left atrial appendage closure device. I believe they will benefit from this procedure given their history of atrial fibrillation, CHA2DS2-VASc score of 5 and unadjusted ischemic stroke rate of 7.2% per year. Unfortunately, the patient is not felt to be a long term anticoagulation candidate secondary to hx of hematuria and concomitant antiplatelet use. The patient's chart has been reviewed and I feel that they would be a candidate for short term oral anticoagulation after Watchman implant.    It is my belief that after  undergoing a LAA closure procedure, Garrett Garrett Bold Sr. will not need long term anticoagulation which eliminates anticoagulation side effects and major bleeding risk.    Procedural risks for the Watchman implant have been reviewed with the patient including a 0.5% risk of stroke, <1% risk of perforation and <1% risk of device embolization. Other risks include bleeding, vascular damage, tamponade, worsening renal function, and death. The patient understands these risk and wishes to proceed.       The published clinical data on the safety and effectiveness of WATCHMAN include but are not limited to the following: - Holmes DR, Jess BEARD, Sick P et al. for the PROTECT AF Investigators. Percutaneous closure of the left atrial appendage versus warfarin therapy for prevention of stroke in patients with atrial fibrillation: a randomised non-inferiority trial. Lancet 2009; 374: 534-42. GLENWOOD Jess BEARD, Doshi SK, Jonita VEAR Satchel D et al. on behalf of the PROTECT AF Investigators. Percutaneous Left Atrial Appendage Closure for Stroke Prophylaxis in Patients With Atrial Fibrillation 2.3-Year Follow-up of the PROTECT AF (Watchman Left Atrial Appendage System for Embolic Protection in Patients With Atrial Fibrillation) Trial. Circulation 2013; 127:720-729. - Alli O, Doshi S,  Kar S, Reddy VY, Sievert H et al. Quality of Life Assessment in the Randomized PROTECT AF (Percutaneous Closure of the Left Atrial Appendage Versus Warfarin Therapy for Prevention of Stroke in Patients With Atrial Fibrillation) Trial of Patients at Risk for Stroke With Nonvalvular Atrial Fibrillation. J Am Coll Cardiol 2013; 61:1790-8. GLENWOOD Satchel DR, Archer RAMAN, Price M, Whisenant B, Sievert H, Doshi S, Huber K, Reddy V. Prospective randomized evaluation of the Watchman left atrial appendage Device in patients with atrial fibrillation versus long-term warfarin therapy; the PREVAIL trial. Journal of the [employer] of Cardiology, Vol. 4, No. 1, 2014,  1-11. - Kar S, Doshi SK, Sadhu A, Horton R, Osorio J et al. Primary outcome evaluation of a next-generation left atrial appendage closure device: results from the PINNACLE FLX trial. Circulation 2021;143(18)1754-1762.      After today's visit with the patient which was dedicated solely for shared decision making visit regarding LAA closure device, the patient decided to proceed with the LAA appendage closure procedure scheduled to be done in the near future at [facility]     He will NOT Need a CT prior to the procedure given a CT chest in 2023.SABRA    HAS-BLED score 4 Hypertension Yes  Abnormal renal and liver function (Dialysis, transplant, Cr >2.26 mg/dL /Cirrhosis or Bilirubin >2x Normal or AST/ALT/AP >3x Normal) No  Stroke No  Bleeding Yes  Labile INR (Unstable/high INR) No  Elderly (>65) Yes  Drugs or alcohol (>= 8 drinks/week, anti-plt or NSAID) Yes    CHA2DS2-VASc Score = 5  The patient's score is based upon: CHF History: 0 HTN History: 1 Diabetes History: 1 Stroke History: 0 Vascular Disease History: 1 Age Score: 2 Gender Score: 0  Presents for [employer] implant today. Procedure reviewed.               Signed, Ole DASEN. Cindie, MD, [facility], [facility] 12/20/2023 Electrophysiology [facility]

## 2023-12-20 NOTE — Anesthesia Procedure Notes (Signed)
 Procedure Name: Intubation Date/Time: 12/20/2023 12:46 PM  Performed by: Cindie Ronal POUR, CRNAPre-anesthesia Checklist: Patient identified, Emergency Drugs available, Suction available and Patient being monitored Patient Re-evaluated:Patient Re-evaluated prior to induction Oxygen Delivery Method: Circle System Utilized Preoxygenation: Pre-oxygenation with 100% oxygen Induction Type: IV induction Ventilation: Two handed mask ventilation required and Oral airway inserted - appropriate to patient size Laryngoscope Size: Cleotilde and 2 Grade View: Grade I Tube type: Oral Tube size: 7.5 mm Number of attempts: 1 Airway Equipment and Method: Stylet and Oral airway Placement Confirmation: ETT inserted through vocal cords under direct vision, positive ETCO2 and breath sounds checked- equal and bilateral Secured at: 24 cm Tube secured with: Tape Dental Injury: Teeth and Oropharynx as per pre-operative assessment

## 2023-12-20 NOTE — Discharge Instructions (Addendum)
 [facility] Procedure, Care After  Procedure MD: Dr. Isidoro Donning Clinical Coordinator: Karsten Fells, RN  This sheet gives you information about how to care for yourself after your procedure. Your health care provider may also give you more specific instructions. If you have problems or questions, contact your health care provider.  What can I expect after the procedure? After the procedure, it is common to have: Bruising around your puncture site. Tenderness around your puncture site. Tiredness (fatigue).  Medication instructions It is very important to continue to take your blood thinner as directed by your doctor after the Watchman procedure. Call your procedure doctor's office with question or concerns. If you are on Coumadin (warfarin), you will have your INR checked the week after your procedure, with a goal INR of 2.0 - 3.0. Please follow your medication instructions on your discharge summary. Only take the medications listed on your discharge paperwork.  Follow up You will be seen in 1 month after your procedure  You will have a repeat CT scan approximately 8 weeks after your procedure mark to check your device You will follow up the MD/APP who performed your procedure 6 months after your procedure The Watchman Clinical Coordinator will check in with you from time to time, including 1 and 2 years after your procedure.    Follow these instructions at home: Puncture site care  Follow instructions from your health care provider about how to take care of your puncture site. Make sure you: If present, leave stitches (sutures), skin glue, or adhesive strips in place.  If a large square bandage is present, this may be removed 24 hours after surgery.  Check your puncture site every day for signs of infection. Check for: Redness, swelling, or pain. Fluid or blood. If your puncture site starts to bleed, lie down on your back, apply firm pressure to the area, and contact your health  care provider. Warmth. Pus or a bad smell. Driving Do not drive yourself home if you received sedation Do not drive for at least 4 days after your procedure or however long your health care provider recommends. (Do not resume driving if you have previously been instructed not to drive for other health reasons.) Do not spend greater than 1 hour at a time in a car for the first 3 days. Stop and take a break with a 5 minute walk at least every hour.  Do not drive or use heavy machinery while taking prescription pain medicine.  Activity Avoid activities that take a lot of effort, including exercise, for at least 7 days after your procedure. For the first 3 days, avoid sitting for longer than one hour at a time.  Avoid alcoholic beverages, signing paperwork, or participating in legal proceedings for 24 hours after receiving sedation Do not lift anything that is heavier than 10 lb (4.5 kg) for one week.  No sexual activity for 1 week.  Return to your normal activities as told by your health care provider. Ask your health care provider what activities are safe for you. General instructions Take over-the-counter and prescription medicines only as told by your health care provider. Do not use any products that contain nicotine or tobacco, such as cigarettes and e-cigarettes. If you need help quitting, ask your health care provider. You may shower after 24 hours, but Do not take baths, swim, or use a hot tub for 1 week.  Do not drink alcohol for 24 hours after your procedure. Keep all follow-up visits as told by  your health care provider. This is important. Dental Work: You will require antibiotics prior to any dental work, including cleanings, for 6 months after your Watchman implantation to help protect you from infection. After 6 months, antibiotics are no longer required. Contact a health care provider if: You have redness, mild swelling, or pain around your puncture site. You have soreness in  your throat or at your puncture site that does not improve after several days You have fluid or blood coming from your puncture site that stops after applying firm pressure to the area. Your puncture site feels warm to the touch. You have pus or a bad smell coming from your puncture site. You have a fever. You have chest pain or discomfort that spreads to your neck, jaw, or arm. You are sweating a lot. You feel nauseous. You have a fast or irregular heartbeat. You have shortness of breath. You are dizzy or light-headed and feel the need to lie down. You have pain or numbness in the arm or leg closest to your puncture site. Get help right away if: Your puncture site suddenly swells. Your puncture site is bleeding and the bleeding does not stop after applying firm pressure to the area. These symptoms may represent a serious problem that is an emergency. Do not wait to see if the symptoms will go away. Get medical help right away. Call your local emergency services (911 in the [place] Do not drive yourself to the hospital. Summary After the procedure, it is normal to have bruising and tenderness at the puncture site in your groin, neck, or forearm. Check your puncture site every day for signs of infection. Get help right away if your puncture site is bleeding and the bleeding does not stop after applying firm pressure to the area. This is a medical emergency.  This information is not intended to replace advice given to you by your health care provider. Make sure you discuss any questions you have with your health care provider.

## 2023-12-20 NOTE — Progress Notes (Signed)
  Echocardiogram Echocardiogram Transesophageal has been performed.  Garrett Jordan 12/20/2023, 2:27 PM

## 2023-12-20 NOTE — Anesthesia Preprocedure Evaluation (Signed)
 Anesthesia Evaluation  Patient identified by MRN, date of birth, ID band Patient awake    Reviewed: Allergy & Precautions, NPO status , Patient's Chart, lab work & pertinent test results  History of Anesthesia Complications Negative for: history of anesthetic complications  Airway Mallampati: III  TM Distance: >3 FB Neck ROM: Full    Dental  (+) Dental Advisory Given, Teeth Intact   Pulmonary neg sleep apnea, neg COPD, neg recent URI, former smoker   breath sounds clear to auscultation       Cardiovascular hypertension, Pt. on medications and Pt. on home beta blockers + CAD, + Past MI, + Cardiac Stents and + CABG  + dysrhythmias Atrial Fibrillation  Rhythm:Regular  1. Left ventricular ejection fraction, by estimation, is 55 to 60%. The  left ventricle has normal function. The left ventricle demonstrates global  hypokinesis. There is mild concentric left ventricular hypertrophy. Left  ventricular diastolic parameters  are indeterminate.   2. Right ventricular systolic function is mildly reduced. The right  ventricular size is normal.   3. Left atrial size was moderately dilated.   4. Right atrial size was moderately dilated.   5. The mitral valve is normal in structure. No evidence of mitral valve  regurgitation. No evidence of mitral stenosis.   6. Tricuspid valve regurgitation is moderate.   7. The aortic valve is normal in structure. Aortic valve regurgitation is  not visualized. No aortic stenosis is present.   8. Aneurysm of the ascending aorta, measuring 44 mm. There is mild  dilatation of the aortic root, measuring 39 mm.   9. The inferior vena cava is normal in size with greater than 50%  respiratory variability, suggesting right atrial pressure of 3 mmHg.     Neuro/Psych negative neurological ROS  negative psych ROS   GI/Hepatic Neg liver ROS,GERD  Medicated and Controlled,,  Endo/Other  diabetesHypothyroidism     Renal/GU CRFRenal diseaseLab Results      Component                Value               Date                      NA                       140                 12/17/2023                K                        4.8                 12/17/2023                CO2                      21                  12/17/2023                GLUCOSE                  116 (H)             12/17/2023  BUN                      29 (H)              12/17/2023                CREATININE               1.69 (H)            12/17/2023                CALCIUM                   9.7                 12/17/2023                GFR                      44.62 (L)           03/17/2020                EGFR                     40 (L)              12/17/2023                GFRNONAA                 39 (L)              11/11/2023                Musculoskeletal  (+) Arthritis ,    Abdominal   Peds  Hematology  (+) Blood dyscrasia Lab Results      Component                Value               Date                      WBC                      5.7                 12/17/2023                HGB                      14.8                12/17/2023                HCT                      44.1                12/17/2023                MCV                      90                  12/17/2023                PLT  191                 12/17/2023             Eliquis  plavix    Anesthesia Other Findings   Reproductive/Obstetrics                              Anesthesia Physical Anesthesia Plan  ASA: 3  Anesthesia Plan: General   Post-op Pain Management: Minimal or no pain anticipated   Induction: Intravenous  PONV Risk Score and Plan: 2 and Ondansetron , Dexamethasone , Propofol  infusion and TIVA  Airway Management Planned: Oral ETT and Video Laryngoscope Planned  Additional Equipment: None  Intra-op Plan:   Post-operative Plan: Extubation in OR  Informed Consent: I  have reviewed the patients History and Physical, chart, labs and discussed the procedure including the risks, benefits and alternatives for the proposed anesthesia with the patient or authorized representative who has indicated his/her understanding and acceptance.     Dental advisory given  Plan Discussed with: CRNA  Anesthesia Plan Comments:          Anesthesia Quick Evaluation

## 2023-12-20 NOTE — Transfer of Care (Signed)
 Immediate Anesthesia Transfer of Care Note  Patient: SHANNEN VERNON Sr.  Procedure(s) Performed: LEFT ATRIAL APPENDAGE OCCLUSION TRANSESOPHAGEAL ECHOCARDIOGRAM  Patient Location: PACU  Anesthesia Type:General  Level of Consciousness: awake and sedated  Airway & Oxygen Therapy: Patient Spontanous Breathing and Patient connected to face mask oxygen  Post-op Assessment: Report given to RN and Post -op Vital signs reviewed and stable  Post vital signs: Reviewed and stable  Last Vitals:  Vitals Value Taken Time  BP    Temp    Pulse 56 12/20/23 1437  Resp 12 12/20/23 1438  SpO2 98 % 12/20/23 1437  Vitals shown include unfiled device data.  Last Pain:  Vitals:   12/20/23 0948  TempSrc:   PainSc: 0-No pain         Complications: No notable events documented.

## 2023-12-20 NOTE — Discharge Summary (Signed)
 [facility]    Patient ID: Garrett Jordan.,  MRN: [id number], DOB/AGE: [birth year] 83 y.o.  Admit date: 12/20/2023 Discharge date: 12/20/2023  Primary Care Physician: Seabron Lenis, MD  Primary Cardiologist: Oneil Parchment, MD  Electrophysiologist: OLE ONEIDA HOLTS, MD  Brief HPI: [name]. is a 83 y.o. male with a history of CAD s/p CABG, kidney stones, and atrial fibrillation who was referred for LAAO consideration given bleeding complications with hematuria.    [name] was seen in consultation and was felt to be a good candidate. Pre LAAO CT with anatomy suitable to proceed. He was initially scheduled for several weeks from now however due to a cancellation, he was able to move to 12/20/23.   Hospital Course:  Patient was taken to the cath lab for scheduled procedure however case ultimately aborted. He was monitored in short stay and has done very well with no concerns. Given this, he is being considered for same day discharge later today. He will restart home medications with no changes.   Physical Exam: Vitals:   12/20/23 1432 12/20/23 1433 12/20/23 1445 12/20/23 1450  BP:   114/74 116/82  Pulse: (!) 57 (!) 0 60 (!) 54  Resp: 12  15 11   Temp:   (!) 96.3 F (35.7 C)   TempSrc:   Temporal   SpO2: 93%  98% 95%  Weight:      Height:       Labs:   Lab Results  Component Value Date   WBC 5.7 12/17/2023   HGB 14.8 12/17/2023   HCT 44.1 12/17/2023   MCV 90 12/17/2023   PLT 191 12/17/2023    Recent Labs  Lab 12/17/23 1029  NA 140  K 4.8  CL 105  CO2 21  BUN 29*  CREATININE 1.69*  CALCIUM  9.7  GLUCOSE 116*   Discharge Medications:  Allergies as of 12/20/2023       Reactions   Quinolones Other (See Comments)   Aortic root enlargement   Ace Inhibitors Cough   Crestor  [rosuvastatin ] Other (See Comments)   Muscle aches   Nitroglycerin  Other (See Comments)   Very pronounced lowered B/P with IV nitro for heart caths        Medication  List     TAKE these medications    Accu-Chek Guide test strip Generic drug: glucose blood USE TO TEST TWICE DAILY   acetaminophen  650 MG CR tablet Commonly known as: TYLENOL  Take 1,950 mg by mouth 2 (two) times daily as needed for pain.   allopurinol  300 MG tablet Commonly known as: ZYLOPRIM  Take 300 mg by mouth every other day.   amLODipine  10 MG tablet Commonly known as: NORVASC  TAKE 1 TABLET BY MOUTH DAILY   Aspercreme Lidocaine  4 % Generic drug: lidocaine  Place 1 patch onto the skin daily as needed (pain).   atorvastatin  40 MG tablet Commonly known as: LIPITOR TAKE 1 TABLET BY MOUTH EVERY DAY   carvedilol  6.25 MG tablet Commonly known as: COREG  Take 1 tablet (6.25 mg total) by mouth 2 (two) times daily.   CHOLESTOFF PLUS PO Take 900 mg by mouth daily.   clonazePAM  1 MG tablet Commonly known as: KLONOPIN  Take 0.5-1 mg by mouth at bedtime.   clopidogrel  75 MG tablet Commonly known as: PLAVIX  TAKE 1 TABLET BY MOUTH DAILY   Coenzyme Q10 300 MG Caps Take 300 mg by mouth in the morning.   dapagliflozin  propanediol 10 MG Tabs tablet Commonly  known as: Farxiga  Take 1 tablet (10 mg total) by mouth daily before breakfast. What changed: how much to take   Eliquis  5 MG Tabs tablet Generic drug: apixaban  TAKE 1 TABLET BY MOUTH TWICE A DAY Notes to patient: Restart Eliquis  TONIGHT, 12/20/23   ezetimibe  10 MG tablet Commonly known as: ZETIA  TAKE 1 TABLET BY MOUTH DAILY   fenofibrate  micronized 200 MG capsule Commonly known as: LOFIBRA Take 1 capsule (200 mg total) by mouth daily in the afternoon.   fluorouracil 5 % cream Commonly known as: EFUDEX Apply 1 Application topically daily as needed (Apply to affected area of the face 2 times a day for skin spots).   fluticasone  50 MCG/ACT nasal spray Commonly known as: FLONASE  Place 2 sprays into both nostrils daily as needed for allergies.   glipiZIDE  5 MG tablet Commonly known as: GLUCOTROL  Take 1 tablet (5 mg  total) by mouth 2 (two) times daily before a meal.   isosorbide  mononitrate 30 MG 24 hr tablet Commonly known as: IMDUR  Take 1 tablet (30 mg total) by mouth daily.   levothyroxine  175 MCG tablet Commonly known as: SYNTHROID  Take 175 mcg by mouth daily before breakfast.   loratadine  10 MG tablet Commonly known as: CLARITIN  Take 10 mg by mouth at bedtime.   magnesium  oxide 400 (240 Mg) MG tablet Commonly known as: MAG-OX Take 400 mg by mouth every evening.   meclizine  12.5 MG tablet Commonly known as: ANTIVERT  Take 1 tablet (12.5 mg total) by mouth 3 (three) times daily as needed for dizziness.   nitroGLYCERIN  0.4 MG SL tablet Commonly known as: Nitrostat  Place 1 tablet (0.4 mg total) under the tongue every 5 (five) minutes as needed for chest pain.   olmesartan  40 MG tablet Commonly known as: BENICAR  Take 1 tablet (40 mg total) by mouth daily. What changed: when to take this   pantoprazole  40 MG tablet Commonly known as: PROTONIX  TAKE 1 TABLET BY MOUTH DAILY   PreserVision AREDS 2 Caps Take 1 capsule by mouth 2 (two) times daily.   SYSTANE OP Place 1 drop into both eyes daily as needed (dry/irritated eyes).   tamsulosin  0.4 MG Caps capsule Commonly known as: FLOMAX  Take 1 capsule (0.4 mg total) by mouth daily. What changed: when to take this   triamcinolone cream 0.1 % Commonly known as: KENALOG Apply 1 application  topically daily as needed (Psoriasis).   trolamine salicylate 10 % cream Commonly known as: ASPERCREME Apply 1 Application topically as needed for muscle pain.   TURMERIC PO Take 1 tablet by mouth daily. 500 mg /50 mg with ginger   Vascepa  1 g capsule Generic drug: icosapent  Ethyl TAKE 2 CAPSULES BY MOUTH TWICE A DAY   Vitamin B-12 5000 MCG Tbdp Take 5,000 mcg by mouth daily.   Vitamin C 500 MG Caps Take 500 mg by mouth every evening.   Vitamin D3 50 MCG (2000 UT) Tabs Take 2,000 Units by mouth daily.        Disposition:  Home   Discharge Instructions     Call MD for:  difficulty breathing, headache or visual disturbances   Complete by: As directed    Call MD for:  extreme fatigue   Complete by: As directed    Call MD for:  hives   Complete by: As directed    Call MD for:  persistant dizziness or light-headedness   Complete by: As directed    Call MD for:  persistant nausea and vomiting  Complete by: As directed    Call MD for:  redness, tenderness, or signs of infection (pain, swelling, redness, odor or green/yellow discharge around incision site)   Complete by: As directed    Call MD for:  severe uncontrolled pain   Complete by: As directed    Call MD for:  temperature >100.4   Complete by: As directed    Diet - low sodium heart healthy   Complete by: As directed    Increase activity slowly   Complete by: As directed        Follow-up Information     Sebastian Lamarr SAUNDERS, PA-C Follow up on 02/13/2024.   Specialties: Cardiology, Radiology Why: Your follow up is scheduled for 02/13/24 at 1:30PM. Please arrive by 1:15PM Contact information: [address] [place] [address] [phone]                Duration of Discharge Encounter:  APP Time: 40 minutes   Signed, Kate Minus, NP  12/20/2023 2:57 PM

## 2023-12-21 ENCOUNTER — Encounter (HOSPITAL_COMMUNITY): Payer: Self-pay | Admitting: Cardiology

## 2023-12-21 ENCOUNTER — Telehealth: Payer: Self-pay | Admitting: Cardiology

## 2023-12-21 MED FILL — Fentanyl Citrate Soln Prefilled Syringe 100 MCG/2ML: INTRAMUSCULAR | Qty: 2 | Status: AC

## 2023-12-21 NOTE — Telephone Encounter (Signed)
  [facility]   Watchman Team  Patient presented for elective LAAO with Watchman however after multiple attempts case eventually aborted due to shallow, unfavorable LAA anatomy. Discussed with patient and Dr. Cindie with plan for possible referral for Amulet consideration. Patient plans to read about the device and discuss with his family. Will plan to keep one month follow up and if patient agrees, then can refer at that time.   Kate Minus NP-C

## 2023-12-21 NOTE — Anesthesia Postprocedure Evaluation (Signed)
 Anesthesia Post Note  Patient: Garrett GAGE Sr.  Procedure(s) Performed: LEFT ATRIAL APPENDAGE OCCLUSION TRANSESOPHAGEAL ECHOCARDIOGRAM     Anesthesia Type: General Anesthetic complications: no   No notable events documented.  Last Vitals:  Vitals:   12/20/23 1500 12/20/23 1515  BP: 116/81 (!) 125/96  Pulse: (!) 53 (!) 58  Resp: 12 15  Temp:  (!) 36 C  SpO2: 92% 94%    Last Pain:  Vitals:   12/20/23 1515  TempSrc: Temporal  PainSc: 3                  Moise Friday

## 2023-12-24 DIAGNOSIS — N1832 Chronic kidney disease, stage 3b: Secondary | ICD-10-CM | POA: Diagnosis not present

## 2023-12-28 ENCOUNTER — Other Ambulatory Visit: Payer: Self-pay | Admitting: Cardiology

## 2023-12-28 ENCOUNTER — Other Ambulatory Visit: Payer: Self-pay | Admitting: Nurse Practitioner

## 2023-12-28 ENCOUNTER — Other Ambulatory Visit: Payer: Self-pay | Admitting: Physician Assistant

## 2024-01-01 ENCOUNTER — Encounter: Payer: Self-pay | Admitting: Cardiology

## 2024-01-02 ENCOUNTER — Other Ambulatory Visit (HOSPITAL_COMMUNITY): Payer: Self-pay

## 2024-01-02 ENCOUNTER — Telehealth: Payer: Self-pay

## 2024-01-02 NOTE — Telephone Encounter (Signed)
Patient Advocate Encounter   The patient was approved for a [employer] grant that will help cover the cost of VASCEPA Total amount awarded, $2,500.  Effective: 12/23/23 - 12/21/24   [id number]   Pharmacy provided with approval and processing information. Patient informed via Dorcas Carrow, CPhT  Pharmacy Patient Advocate Specialist  Direct Number: [phone] Fax: [phone]

## 2024-01-02 NOTE — Telephone Encounter (Signed)
Re enrolled in grant. Approval info faxed to pharmacy, made patient aware via mychart.

## 2024-01-03 ENCOUNTER — Other Ambulatory Visit: Payer: Self-pay | Admitting: Internal Medicine

## 2024-01-04 DIAGNOSIS — E1122 Type 2 diabetes mellitus with diabetic chronic kidney disease: Secondary | ICD-10-CM | POA: Diagnosis not present

## 2024-01-04 DIAGNOSIS — N200 Calculus of kidney: Secondary | ICD-10-CM | POA: Diagnosis not present

## 2024-01-04 DIAGNOSIS — D631 Anemia in chronic kidney disease: Secondary | ICD-10-CM | POA: Diagnosis not present

## 2024-01-04 DIAGNOSIS — N2581 Secondary hyperparathyroidism of renal origin: Secondary | ICD-10-CM | POA: Diagnosis not present

## 2024-01-04 DIAGNOSIS — I129 Hypertensive chronic kidney disease with stage 1 through stage 4 chronic kidney disease, or unspecified chronic kidney disease: Secondary | ICD-10-CM | POA: Diagnosis not present

## 2024-01-04 DIAGNOSIS — N1832 Chronic kidney disease, stage 3b: Secondary | ICD-10-CM | POA: Diagnosis not present

## 2024-01-10 DIAGNOSIS — I480 Paroxysmal atrial fibrillation: Secondary | ICD-10-CM

## 2024-01-28 ENCOUNTER — Other Ambulatory Visit: Payer: Self-pay

## 2024-01-28 ENCOUNTER — Encounter (HOSPITAL_COMMUNITY): Payer: Self-pay

## 2024-01-28 ENCOUNTER — Emergency Department (HOSPITAL_COMMUNITY)
Admission: EM | Admit: 2024-01-28 | Discharge: 2024-01-28 | Disposition: A | Discharge status: Home / Self Care | Attending: Student | Admitting: Student

## 2024-01-28 DIAGNOSIS — Z955 Presence of coronary angioplasty implant and graft: Secondary | ICD-10-CM | POA: Insufficient documentation

## 2024-01-28 DIAGNOSIS — E039 Hypothyroidism, unspecified: Secondary | ICD-10-CM | POA: Diagnosis not present

## 2024-01-28 DIAGNOSIS — Z8546 Personal history of malignant neoplasm of prostate: Secondary | ICD-10-CM | POA: Diagnosis not present

## 2024-01-28 DIAGNOSIS — N179 Acute kidney failure, unspecified: Secondary | ICD-10-CM | POA: Diagnosis not present

## 2024-01-28 DIAGNOSIS — E1122 Type 2 diabetes mellitus with diabetic chronic kidney disease: Secondary | ICD-10-CM | POA: Diagnosis not present

## 2024-01-28 DIAGNOSIS — I129 Hypertensive chronic kidney disease with stage 1 through stage 4 chronic kidney disease, or unspecified chronic kidney disease: Secondary | ICD-10-CM | POA: Insufficient documentation

## 2024-01-28 DIAGNOSIS — I2510 Atherosclerotic heart disease of native coronary artery without angina pectoris: Secondary | ICD-10-CM | POA: Insufficient documentation

## 2024-01-28 DIAGNOSIS — Z8616 Personal history of COVID-19: Secondary | ICD-10-CM | POA: Diagnosis not present

## 2024-01-28 DIAGNOSIS — E860 Dehydration: Secondary | ICD-10-CM | POA: Insufficient documentation

## 2024-01-28 DIAGNOSIS — Z87891 Personal history of nicotine dependence: Secondary | ICD-10-CM | POA: Insufficient documentation

## 2024-01-28 DIAGNOSIS — N183 Chronic kidney disease, stage 3 unspecified: Secondary | ICD-10-CM | POA: Diagnosis not present

## 2024-01-28 DIAGNOSIS — Z7901 Long term (current) use of anticoagulants: Secondary | ICD-10-CM | POA: Diagnosis not present

## 2024-01-28 DIAGNOSIS — R42 Dizziness and giddiness: Secondary | ICD-10-CM | POA: Diagnosis present

## 2024-01-28 DIAGNOSIS — Z7984 Long term (current) use of oral hypoglycemic drugs: Secondary | ICD-10-CM | POA: Diagnosis not present

## 2024-01-28 DIAGNOSIS — Z79899 Other long term (current) drug therapy: Secondary | ICD-10-CM | POA: Diagnosis not present

## 2024-01-28 LAB — CBC
HCT: 43.6 % (ref 39.0–52.0)
Hemoglobin: 14.8 g/dL (ref 13.0–17.0)
MCH: 30.8 pg (ref 26.0–34.0)
MCHC: 33.9 g/dL (ref 30.0–36.0)
MCV: 90.8 fL (ref 80.0–100.0)
Platelets: 148 10*3/uL — ABNORMAL LOW (ref 150–400)
RBC: 4.8 MIL/uL (ref 4.22–5.81)
RDW: 13.4 % (ref 11.5–15.5)
WBC: 5.3 10*3/uL (ref 4.0–10.5)
nRBC: 0 % (ref 0.0–0.2)

## 2024-01-28 LAB — RESP PANEL BY RT-PCR (RSV, FLU A&B, COVID)  RVPGX2
Influenza A by PCR: NEGATIVE
Influenza B by PCR: NEGATIVE
Resp Syncytial Virus by PCR: NEGATIVE
SARS Coronavirus 2 by RT PCR: NEGATIVE

## 2024-01-28 LAB — COMPREHENSIVE METABOLIC PANEL
ALT: 20 U/L (ref 0–44)
AST: 47 U/L — ABNORMAL HIGH (ref 15–41)
Albumin: 3.5 g/dL (ref 3.5–5.0)
Alkaline Phosphatase: 73 U/L (ref 38–126)
Anion gap: 10 (ref 5–15)
BUN: 24 mg/dL — ABNORMAL HIGH (ref 8–23)
CO2: 20 mmol/L — ABNORMAL LOW (ref 22–32)
Calcium: 9.3 mg/dL (ref 8.9–10.3)
Chloride: 105 mmol/L (ref 98–111)
Creatinine, Ser: 1.91 mg/dL — ABNORMAL HIGH (ref 0.61–1.24)
GFR, Estimated: 35 mL/min — ABNORMAL LOW (ref >60)
Glucose, Bld: 111 mg/dL — ABNORMAL HIGH (ref 70–99)
Potassium: 4 mmol/L (ref 3.5–5.1)
Sodium: 135 mmol/L (ref 135–145)
Total Bilirubin: 2 mg/dL — ABNORMAL HIGH (ref 0.0–1.2)
Total Protein: 6 g/dL — ABNORMAL LOW (ref 6.5–8.1)

## 2024-01-28 LAB — LIPASE, BLOOD: Lipase: 46 U/L (ref 11–51)

## 2024-01-28 LAB — TROPONIN I (HIGH SENSITIVITY)
Troponin I (High Sensitivity): 7 ng/L (ref <18)
Troponin I (High Sensitivity): 6 ng/L (ref <18)

## 2024-01-28 MED ORDER — LACTATED RINGERS IV BOLUS
1000.0000 mL | Freq: Once | INTRAVENOUS | Status: AC
  Administered 2024-01-28: 1000 mL via INTRAVENOUS

## 2024-01-28 MED ORDER — ONDANSETRON 4 MG PO TBDP
4.0000 mg | ORAL_TABLET | Freq: Once | ORAL | Status: AC
  Administered 2024-01-28: 4 mg via ORAL
  Filled 2024-01-28: qty 1

## 2024-01-28 NOTE — ED Triage Notes (Addendum)
 Pt presents with complaints of being cold and noted a low BP with a systolic of 101 and he has been nauseated today. He has associated malaise. Denies CP, ShOB, diarrhea, or dysuria. Denies sick contacts. Has not tried taken anything for symptoms.

## 2024-01-28 NOTE — ED Provider Triage Note (Signed)
 Emergency Medicine Provider Triage Evaluation Note  Garrett Jordan Sr. , a 83 y.o. male  was evaluated in triage.  Pt complains of feeling cold, generalized malaise.  Somewhat low blood pressure for him, reports systolic of 101 at home.  He reports he never has chest pain with previous cardiac events and with the general malaise, feeling cold, intermittent nausea he wanted to get checked out.  Denies any vomiting, denies shortness of breath..  Review of Systems  Positive: Malaise, chills Negative: Chest pain or shortness of breath  Physical Exam  BP 106/69 (BP Location: Right Arm)   Pulse 80   Temp 97.6 F (36.4 C) (Oral)   Resp 20   Ht 5' 9.5" (1.765 m)   Wt 86.2 kg   SpO2 94%   BMI 27.66 kg/m  Gen:   Awake, no distress   Resp:  Normal effort  MSK:   Moves extremities without difficulty  Other:    Medical Decision Making  Medically screening exam initiated at 12:50 PM.  Appropriate orders placed.  Garrett Graves Sr. was informed that the remainder of the evaluation will be completed by another provider, this initial triage assessment does not replace that evaluation, and the importance of remaining in the ED until their evaluation is complete.  Workup initiated in triage    Garrett Jordan, [place] 01/28/24 1251

## 2024-01-29 NOTE — ED Provider Notes (Addendum)
 [place] EMERGENCY DEPARTMENT AT [facility] Provider Note  CSN: [id number] Arrival date & time: 01/28/24 1211  Chief Complaint(s) Hypotension  HPI Garrett HERSHBERGER Sr. is a 83 y.o. male with PMH AAA, A-fib on Eliquis status post attempted but failed Watchman procedure, prostate cancer, CKD 3, T2DM, CAD status post MI who presents emergency room for evaluation of lightheadedness.  States that this morning he woke up, sat up and experienced a sensation of generalized malaise, lightheadedness, chills and intermittent nausea.  Has known coronary artery disease and was concerned about his heart and came for further evaluation.  States that he took his blood pressure and had systolics in the low 100s which is abnormal for him.  Denies current chest pain, shortness of breath, abdominal pain, nausea, vomiting, headache, fever or other systemic symptoms.   Past Medical History Past Medical History:  Diagnosis Date   AAA (abdominal aortic aneurysm) (HCC)    Arthritis    knees   Atrial fibrillation (HCC)    CAD (coronary artery disease)    Cancer (HCC)    prostate   CKD (chronic kidney disease), stage III (HCC) 06/18/2022   COVID    06/2019 and in 2022 states they were mild cases   Diabetes mellitus without complication (HCC)    Dyspnea    once in a while   Dysrhythmia    A-fib pt is on Eliquis   GERD (gastroesophageal reflux disease)    Gout    History of kidney stones    Hyperlipidemia    Hypertension    Hypothyroidism    Myocardial infarction (HCC) 01/1991   sp inferior   Nerve compression    right leg   Reflux    Seasonal allergies    Thoracic ascending aortic aneurysm (HCC)    Thyroid disease    hypothyroidism   Patient Active Problem List   Diagnosis Date Noted   Blood in stool 06/28/2023   History of colonic polyps 06/28/2023   Hematuria 04/28/2023   Gross hematuria 04/27/2023   UTI (urinary tract infection) 04/27/2023   Ureteral stone with hydronephrosis  04/25/2023   Sepsis (HCC) 04/25/2023   Pyelonephritis 04/25/2023   Acute kidney injury superimposed on chronic kidney disease (HCC) 04/25/2023   Essential hypertension 04/25/2023   S/P lumbar fusion 11/10/2022   Diabetes mellitus (HCC) 09/21/2022   CKD (chronic kidney disease), stage III (HCC) 06/18/2022   Hypercoagulable state due to atrial fibrillation (HCC) 01/23/2022   Type 2 diabetes mellitus with stage 3b chronic kidney disease, without long-term current use of insulin (HCC) 09/29/2021   Ascending aortic aneurysm (HCC) 07/15/2021   Intermittent palpitations 03/24/2020   Hypertriglyceridemia 03/17/2020   Hypercalcemia 03/17/2020   Type 2 diabetes mellitus with hyperglycemia, with long-term current use of insulin (HCC) 03/17/2020   Penetrating ulcer of aorta (HCC) 03/03/2020   Paroxysmal A-fib (HCC) 12/02/2018   Pain in right knee 06/27/2018   Endoleak post endovascular aneurysm repair    Endoleak of aortic graft 07/16/2015   S/P angioplasty 07/16/2015   Abdominal aortic aneurysm (HCC)    AAA (abdominal aortic aneurysm) without rupture (HCC)    Chest pain 01/07/2015   CAD (coronary artery disease)    Hypertensive heart disease without CHF    Hyperlipidemia    S/P CABG (coronary artery bypass graft)    Personal history of prostate cancer    Hypothyroidism    Benign prostatic hyperplasia    Gout    Lumbar disc disease    History  of kidney stones    History of hyperparathyroidism    Gallstones    Home Medication(s) Prior to Admission medications   Medication Sig Start Date End Date Taking? Authorizing Provider  acetaminophen (TYLENOL) 650 MG CR tablet Take 1,950 mg by mouth 2 (two) times daily as needed for pain.    [provider]  allopurinol (ZYLOPRIM) 300 MG tablet Take 300 mg by mouth every other day.    [provider]  amLODipine (NORVASC) 10 MG tablet TAKE 1 TABLET BY MOUTH DAILY 08/21/23   Jake Bathe, MD  Ascorbic Acid (VITAMIN C) 500 MG CAPS  Take 500 mg by mouth every evening.    [provider]  atorvastatin (LIPITOR) 40 MG tablet TAKE 1 TABLET BY MOUTH EVERY DAY 12/28/23   Jake Bathe, MD  carvedilol (COREG) 6.25 MG tablet TAKE 1 TABLET BY MOUTH TWICE A DAY 12/28/23   Jake Bathe, MD  Cholecalciferol (VITAMIN D3) 2000 UNITS TABS Take 2,000 Units by mouth daily.     [provider]  clonazePAM (KLONOPIN) 1 MG tablet Take 0.5-1 mg by mouth at bedtime.    [provider]  clopidogrel (PLAVIX) 75 MG tablet TAKE 1 TABLET BY MOUTH DAILY 09/12/23   Jake Bathe, MD  Coenzyme Q10 300 MG CAPS Take 300 mg by mouth in the morning.    [provider]  Cyanocobalamin (VITAMIN B-12) 5000 MCG TBDP Take 5,000 mcg by mouth daily.    [provider]  dapagliflozin propanediol (FARXIGA) 10 MG TABS tablet Take 1 tablet (10 mg total) by mouth daily before breakfast. Patient taking differently: Take 5 mg by mouth daily before breakfast. 09/25/23   Shamleffer, Konrad Dolores, MD  ELIQUIS 5 MG TABS tablet TAKE 1 TABLET BY MOUTH TWICE A DAY 09/24/23   Jake Bathe, MD  ezetimibe (ZETIA) 10 MG tablet TAKE 1 TABLET BY MOUTH DAILY 01/04/24   Georgie Chard D, NP  fenofibrate micronized (LOFIBRA) 200 MG capsule Take 1 capsule (200 mg total) by mouth daily in the afternoon. 12/18/23   Shamleffer, Konrad Dolores, MD  fluorouracil (EFUDEX) 5 % cream Apply 1 Application topically daily as needed (Apply to affected area of the face 2 times a day for skin spots).    [provider]  fluticasone (FLONASE) 50 MCG/ACT nasal spray Place 2 sprays into both nostrils daily as needed for allergies.    [provider]  glipiZIDE (GLUCOTROL) 5 MG tablet Take 1 tablet (5 mg total) by mouth 2 (two) times daily before a meal. 09/25/23   Shamleffer, Konrad Dolores, MD  glucose blood (ACCU-CHEK GUIDE) test strip USE TO TEST TWICE DAILY 06/11/23   Shamleffer, Konrad Dolores, MD  isosorbide mononitrate (IMDUR) 30 MG  24 hr tablet Take 1 tablet (30 mg total) by mouth daily. 11/12/23   Jake Bathe, MD  levothyroxine (SYNTHROID, LEVOTHROID) 175 MCG tablet Take 175 mcg by mouth daily before breakfast.    [provider]  lidocaine (ASPERCREME LIDOCAINE) 4 % Place 1 patch onto the skin daily as needed (pain).    [provider]  loratadine (CLARITIN) 10 MG tablet Take 10 mg by mouth at bedtime.    [provider]  magnesium oxide (MAG-OX) 400 (240 Mg) MG tablet Take 400 mg by mouth every evening.    [provider]  meclizine (ANTIVERT) 12.5 MG tablet Take 1 tablet (12.5 mg total) by mouth 3 (three) times daily as needed for dizziness. 11/11/23  Durwin Glaze, MD  Multiple Vitamins-Minerals (PRESERVISION AREDS 2) CAPS Take 1 capsule by mouth 2 (two) times daily.    [provider]  nitroGLYCERIN (NITROSTAT) 0.4 MG SL tablet Place 1 tablet (0.4 mg total) under the tongue every 5 (five) minutes as needed for chest pain. 06/17/22   Barrett, Joline Salt, PA-C  olmesartan (BENICAR) 40 MG tablet TAKE 1 TABLET BY MOUTH EVERY DAY 12/28/23   Jake Bathe, MD  pantoprazole (PROTONIX) 40 MG tablet TAKE 1 TABLET BY MOUTH DAILY 09/03/23   Jake Bathe, MD  Plant Sterols and Stanols (CHOLESTOFF PLUS PO) Take 900 mg by mouth daily.    [provider]  Polyethyl Glycol-Propyl Glycol (SYSTANE OP) Place 1 drop into both eyes daily as needed (dry/irritated eyes).    [provider]  tamsulosin (FLOMAX) 0.4 MG CAPS capsule Take 1 capsule (0.4 mg total) by mouth daily. Patient taking differently: Take 0.4 mg by mouth every other day. 05/05/23   Meredeth Ide, MD  triamcinolone cream (KENALOG) 0.1 % Apply 1 application  topically daily as needed (Psoriasis). 02/07/18   [provider]  trolamine salicylate (ASPERCREME) 10 % cream Apply 1 Application topically as needed for muscle pain.    [provider]  TURMERIC PO Take 1 tablet by mouth daily. 500 mg /50 mg  with ginger    [provider]  VASCEPA 1 g capsule TAKE 2 CAPSULES BY MOUTH TWICE A DAY 04/06/23   Jake Bathe, MD                                                                                                                                    Past Surgical History Past Surgical History:  Procedure Laterality Date   ABDOMINAL AORTAGRAM N/A 02/28/2012   Procedure: ABDOMINAL AORTAGRAM;  Surgeon: Nada Libman, MD;  Location: [facility] CATH LAB;  Service: Cardiovascular;  Laterality: N/A;   abdominal aortagram embolization  02/28/2012   ABDOMINAL AORTIC ANEURYSM REPAIR  07/14/2012   BIOPSY  06/28/2023   Procedure: BIOPSY;  Surgeon: Charlott Rakes, MD;  Location: [facility] ENDOSCOPY;  Service: Gastroenterology;;   CARDIAC CATHETERIZATION     CATARACT EXTRACTION Bilateral 2016   COLONOSCOPY WITH PROPOFOL N/A 06/28/2023   Procedure: COLONOSCOPY WITH PROPOFOL;  Surgeon: Charlott Rakes, MD;  Location: [facility] ENDOSCOPY;  Service: Gastroenterology;  Laterality: N/A;   CORONARY ARTERY BYPASS GRAFT  04/13/2009   CORONARY BALLOON ANGIOPLASTY N/A 06/15/2022   Procedure: CORONARY BALLOON ANGIOPLASTY;  Surgeon: Tonny Bollman, MD;  Location: [facility] INVASIVE CV LAB;  Service: Cardiovascular;  Laterality: N/A;   CORONARY STENT PLACEMENT  1992 and  2009   RCA   CYSTOSCOPY W/ URETERAL STENT PLACEMENT Right 04/24/2023   Procedure: CYSTOSCOPY WITH RETROGRADE PYELOGRAM/URETERAL STENT PLACEMENT;  Surgeon: Crist Fat, MD;  Location: [facility] OR;  Service: Urology;  Laterality: Right;   CYSTOSCOPY/URETEROSCOPY/HOLMIUM LASER/STENT PLACEMENT Right 05/03/2023   Procedure: CYSTOSCOPY/URETEROSCOPY/HOLMIUM  LASER/STONE EXTRACTION/STENT REMOVAL, FULGURATION;  Surgeon: Marcine Matar, MD;  Location: [facility] ORS;  Service: Urology;  Laterality: Right;  1 HR   EMBOLIZATION Right 02/28/2012   Procedure: EMBOLIZATION;  Surgeon: Nada Libman, MD;  Location: [facility] CATH LAB;  Service: Cardiovascular;  Laterality: Right;   IR  GENERIC HISTORICAL  12/05/2016   IR RADIOLOGIST EVAL & MGMT 12/05/2016 Malachy Moan, MD [facility] INTERV RAD   IR RADIOLOGIST EVAL & MGMT  07/10/2019   IR RADIOLOGIST EVAL & MGMT  08/25/2020   IR RADIOLOGIST EVAL & MGMT  08/16/2021   IR RADIOLOGIST EVAL & MGMT  08/31/2022   KIDNEY STONE SURGERY     LAMINECTOMY WITH POSTERIOR LATERAL ARTHRODESIS LEVEL 1 Bilateral 11/10/2022   Procedure: Lumbar Two-Lumbar Three laminectomy  - bilateral with posterior lateral fusion and interspinous plating;  Surgeon: Tia Alert, MD;  Location: [facility] OR;  Service: Neurosurgery;  Laterality: Bilateral;   LEFT ATRIAL APPENDAGE OCCLUSION N/A 12/20/2023   Procedure: LEFT ATRIAL APPENDAGE OCCLUSION;  Surgeon: Lanier Prude, MD;  Location: [facility] INVASIVE CV LAB;  Service: Cardiovascular;  Laterality: N/A;   LEFT HEART CATH AND CORONARY ANGIOGRAPHY N/A 06/15/2022   Procedure: LEFT HEART CATH AND CORONARY ANGIOGRAPHY;  Surgeon: Tonny Bollman, MD;  Location: [facility] INVASIVE CV LAB;  Service: Cardiovascular;  Laterality: N/A;   LEFT HEART CATHETERIZATION WITH CORONARY/GRAFT ANGIOGRAM N/A 01/08/2015   Procedure: LEFT HEART CATHETERIZATION WITH Isabel Caprice;  Surgeon: Othella Boyer, MD;  Location: [facility] CATH LAB;  Service: Cardiovascular;  Laterality: N/A;   parathyroid adenoma  1981   surgery   POLYPECTOMY  06/28/2023   Procedure: POLYPECTOMY;  Surgeon: Charlott Rakes, MD;  Location: [facility] ENDOSCOPY;  Service: Gastroenterology;;   PROSTATE SURGERY     rad seeds   SPINE SURGERY  2006   TONSILLECTOMY  1955   TRANSESOPHAGEAL ECHOCARDIOGRAM (CATH LAB) N/A 12/20/2023   Procedure: TRANSESOPHAGEAL ECHOCARDIOGRAM;  Surgeon: Lanier Prude, MD;  Location: [facility] INVASIVE CV LAB;  Service: Cardiovascular;  Laterality: N/A;   Family History Family History  Problem Relation Age of Onset   Heart disease Father        Heart Disease before age 31   Heart attack Father    Hyperlipidemia Father    Hypertension Father     Stroke Mother    Deep vein thrombosis Mother    Heart disease Sister    Diabetes Sister    Hyperlipidemia Sister    Hypertension Sister    Heart attack Sister    Heart disease Paternal Uncle    Diabetes Maternal Grandmother    Diabetes Sister     Social History Social History   Tobacco Use   Smoking status: Former    Current packs/day: 0.00    Types: Cigarettes    Quit date: 08/01/1970    Years since quitting: 53.5   Smokeless tobacco: Never  Vaping Use   Vaping status: Never Used  Substance Use Topics   Alcohol use: Yes    Alcohol/week: 6.0 standard drinks of alcohol    Types: 3 Glasses of wine, 3 Cans of beer per week    Comment: 1-3 glasses 3-4 times a week   Drug use: No   Allergies Quinolones, Ace inhibitors, Crestor [rosuvastatin], and Nitroglycerin  Review of Systems Review of Systems  Constitutional:  Positive for chills and fatigue.    Physical Exam Vital Signs  I have reviewed the triage vital signs BP 119/72 (BP Location: Right Arm)   Pulse 77  Temp 97.8 F (36.6 C) (Oral)   Resp 18   Ht 5' 9.5" (1.765 m)   Wt 86.2 kg   SpO2 97%   BMI 27.66 kg/m   Physical Exam Constitutional:      General: He is not in acute distress.    Appearance: Normal appearance.  HENT:     Head: Normocephalic and atraumatic.     Nose: No congestion or rhinorrhea.  Eyes:     General:        Right eye: No discharge.        Left eye: No discharge.     Extraocular Movements: Extraocular movements intact.     Pupils: Pupils are equal, round, and reactive to light.  Cardiovascular:     Rate and Rhythm: Normal rate. Rhythm irregular.     Heart sounds: No murmur heard. Pulmonary:     Effort: No respiratory distress.     Breath sounds: No wheezing or rales.  Abdominal:     General: There is no distension.     Tenderness: There is no abdominal tenderness.  Musculoskeletal:        General: Normal range of motion.     Cervical back: Normal range of motion.  Skin:     General: Skin is warm and dry.  Neurological:     General: No focal deficit present.     Mental Status: He is alert.     ED Results and Treatments Labs (all labs ordered are listed, but only abnormal results are displayed) Labs Reviewed  COMPREHENSIVE METABOLIC PANEL - Abnormal; Notable for the following components:      Result Value   CO2 20 (*)    Glucose, Bld 111 (*)    BUN 24 (*)    Creatinine, Ser 1.91 (*)    Total Protein 6.0 (*)    AST 47 (*)    Total Bilirubin 2.0 (*)    GFR, Estimated 35 (*)    All other components within normal limits  CBC - Abnormal; Notable for the following components:   Platelets 148 (*)    All other components within normal limits  RESP PANEL BY RT-PCR (RSV, FLU A&B, COVID)  RVPGX2  LIPASE, BLOOD  TROPONIN I (HIGH SENSITIVITY)  TROPONIN I (HIGH SENSITIVITY)                                                                                                                          Radiology No results found.  Pertinent labs & imaging results that were available during my care of the patient were reviewed by me and considered in my medical decision making (see MDM for details).  Medications Ordered in ED Medications  ondansetron (ZOFRAN-ODT) disintegrating tablet 4 mg (4 mg Oral Given 01/28/24 1653)  lactated ringers bolus 1,000 mL (0 mLs Intravenous Stopped 01/28/24 1846)  Procedures Procedures  (including critical care time)  Medical Decision Making / ED Course   This patient presents to the ED for concern of chills, lightheadedness, this involves an extensive number of treatment options, and is a complaint that carries with it a high risk of complications and morbidity.  The differential diagnosis includes orthostatic nearsyncope, cardiogenic nearsyncope, vasovagal nearsyncope, electrolyte abnormality,  dehydration, dysrhythmia, vasovagal, Hypoglycemia, Seizure, Autonomic Insufficiency  MDM: Patient seen emergency room for evaluation of lightheadedness.  Physical exam is largely unremarkable.  Laboratory evaluation with a BUN of 24, creatinine 1.91 which is an elevation for this patient, total bili 2.0, COVID, flu, RSV negative, high-sensitivity troponin and delta troponin unremarkable.  ECG with no evidence of ischemia or dysrhythmia other than known A-fib at baseline.  Patient fluid resuscitated and is feeling improved.  Orthostatics are normal.  Patient has had no chest pain or abdominal pain and low suspicion that he is having any associated decompensation related to his aneurysm today.  Thus, advanced imaging deferred.  At this time he does not meet inpatient criteria for admission and will be discharged with outpatient follow-up.  Suspect dehydration and associated orthostatic near syncope today.  Patient discharged with outpatient follow-up and return precautions of which she voiced understanding.   Additional history obtained:  -External records from outside source obtained and reviewed including: Chart review including previous notes, labs, imaging, consultation notes   Lab Tests: -I ordered, reviewed, and interpreted labs.   The pertinent results include:   Labs Reviewed  COMPREHENSIVE METABOLIC PANEL - Abnormal; Notable for the following components:      Result Value   CO2 20 (*)    Glucose, Bld 111 (*)    BUN 24 (*)    Creatinine, Ser 1.91 (*)    Total Protein 6.0 (*)    AST 47 (*)    Total Bilirubin 2.0 (*)    GFR, Estimated 35 (*)    All other components within normal limits  CBC - Abnormal; Notable for the following components:   Platelets 148 (*)    All other components within normal limits  RESP PANEL BY RT-PCR (RSV, FLU A&B, COVID)  RVPGX2  LIPASE, BLOOD  TROPONIN I (HIGH SENSITIVITY)  TROPONIN I (HIGH SENSITIVITY)      EKG   EKG  Interpretation Date/Time:  Monday January 28 2024 12:52:46 EDT Ventricular Rate:  85 PR Interval:    QRS Duration:  152 QT Interval:  408 QTC Calculation: 485 R Axis:   109  Text Interpretation: Atrial fibrillation Right bundle branch block Abnormal ECG When compared with ECG of 17-Dec-2023 09:07, PREVIOUS ECG IS PRESENT Confirmed by Yousaf Sainato ([id number]) on 01/28/2024 4:24:33 PM         Medicines ordered and prescription drug management: Meds ordered this encounter  Medications   ondansetron (ZOFRAN-ODT) disintegrating tablet 4 mg   lactated ringers bolus 1,000 mL    -I have reviewed the patients home medicines and have made adjustments as needed  Critical interventions none   Social Determinants of Health:  Factors impacting patients care include: none   Reevaluation: After the interventions noted above, I reevaluated the patient and found that they have :improved  Co morbidities that complicate the patient evaluation  Past Medical History:  Diagnosis Date   AAA (abdominal aortic aneurysm) (HCC)    Arthritis    knees   Atrial fibrillation (HCC)    CAD (coronary artery disease)    Cancer (HCC)    prostate  CKD (chronic kidney disease), stage III (HCC) 06/18/2022   COVID    06/2019 and in 2022 states they were mild cases   Diabetes mellitus without complication (HCC)    Dyspnea    once in a while   Dysrhythmia    A-fib pt is on Eliquis   GERD (gastroesophageal reflux disease)    Gout    History of kidney stones    Hyperlipidemia    Hypertension    Hypothyroidism    Myocardial infarction (HCC) 01/1991   sp inferior   Nerve compression    right leg   Reflux    Seasonal allergies    Thoracic ascending aortic aneurysm (HCC)    Thyroid disease    hypothyroidism      Dispostion: I considered admission for this patient, but at this time he does not meet inpatient criteria for admission and will be discharged with outpatient follow-up     Final  Clinical Impression(s) / ED Diagnoses Final diagnoses:  Dehydration  AKI (acute kidney injury) [facility])  Lightheadedness     @PCDICTATION @    Glendora Score, MD 01/29/24 1146    Glendora Score, MD 01/29/24 1146

## 2024-02-12 NOTE — Progress Notes (Unsigned)
 [facility]   MULTIDISCIPLINARY HEART VALVE CLINIC                                     Cardiology Office Note:    Date:  02/13/2024   ID:  Garrett Graves Sr., DOB 06/06/1941, MRN [id number]  PCP:  [name], MD  [facility] Cardiologist:  Donato Schultz, MD  [facility] Structural heart: None [facility] Electrophysiologist:  Lanier Prude, MD   Referring MD: [name], MD   Follow up  History of Present Illness:    Garrett Bua. is a 83 y.o. male with a hx of CAD s/p CABG, kidney stones, hematuria and paroxysmal atrial fibrillation s/p aborted LAAO who presents to clinic for follow up.    Garrett Jordan was seen in consultation and was felt to be a good candidate for Watchman given hematuria and need for Plavix for CAD. Pre procedure planning CT showed anatomy suitable to proceed. He presented for left atrial appendage occlusion procedure on 12/20/23. Unfortunately, his left atrial appendage was shallow preventing successful device implant. The patient will be referred as an outpatient for amulet device.   Today the patient presents to clinic for follow up. No CP or SOB. No LE edema, orthopnea or PND. No dizziness or syncope. No blood in stool or urine. No palpitations. Has been a little more lethargic with cold weather. He has some dental implants he would like to get taken care of but is interested in consultation to discuss Amulet.    Past Medical History:  Diagnosis Date   AAA (abdominal aortic aneurysm) (HCC)    Arthritis    knees   Atrial fibrillation (HCC)    CAD (coronary artery disease)    Cancer (HCC)    prostate   CKD (chronic kidney disease), stage III (HCC) 06/18/2022   COVID    06/2019 and in 2022 states they were mild cases   Diabetes mellitus without complication (HCC)    Dyspnea    once in a while   Dysrhythmia    A-fib pt is on Eliquis   GERD (gastroesophageal reflux disease)    Gout    History of kidney stones     Hyperlipidemia    Hypertension    Hypothyroidism    Myocardial infarction (HCC) 01/1991   sp inferior   Nerve compression    right leg   Reflux    Seasonal allergies    Thoracic ascending aortic aneurysm (HCC)    Thyroid disease    hypothyroidism     Current Medications: Current Meds  Medication Sig   acetaminophen (TYLENOL) 650 MG CR tablet Take 1,950 mg by mouth 2 (two) times daily as needed for pain.   allopurinol (ZYLOPRIM) 300 MG tablet Take 300 mg by mouth every other day.   amLODipine (NORVASC) 10 MG tablet TAKE 1 TABLET BY MOUTH DAILY   Ascorbic Acid (VITAMIN C) 500 MG CAPS Take 500 mg by mouth every evening.   atorvastatin (LIPITOR) 40 MG tablet TAKE 1 TABLET BY MOUTH EVERY DAY   carvedilol (COREG) 6.25 MG tablet TAKE 1 TABLET BY MOUTH TWICE A DAY   Cholecalciferol (VITAMIN D3) 2000 UNITS TABS Take 2,000 Units by mouth daily.    clonazePAM (KLONOPIN) 1 MG tablet Take 0.5-1 mg by mouth at bedtime.   clopidogrel (PLAVIX) 75 MG tablet TAKE 1 TABLET BY MOUTH DAILY  Coenzyme Q10 300 MG CAPS Take 300 mg by mouth in the morning.   Cyanocobalamin (VITAMIN B-12) 5000 MCG TBDP Take 5,000 mcg by mouth daily.   dapagliflozin propanediol (FARXIGA) 10 MG TABS tablet Take 1 tablet (10 mg total) by mouth daily before breakfast. (Patient taking differently: Take 5 mg by mouth daily before breakfast.)   ELIQUIS 5 MG TABS tablet TAKE 1 TABLET BY MOUTH TWICE A DAY   ezetimibe (ZETIA) 10 MG tablet TAKE 1 TABLET BY MOUTH DAILY   fenofibrate micronized (LOFIBRA) 200 MG capsule Take 1 capsule (200 mg total) by mouth daily in the afternoon.   fluorouracil (EFUDEX) 5 % cream Apply 1 Application topically daily as needed (Apply to affected area of the face 2 times a day for skin spots).   fluticasone (FLONASE) 50 MCG/ACT nasal spray Place 2 sprays into both nostrils daily as needed for allergies.   glipiZIDE (GLUCOTROL) 5 MG tablet Take 1 tablet (5 mg total) by mouth 2 (two) times daily before a  meal.   glucose blood (ACCU-CHEK GUIDE) test strip USE TO TEST TWICE DAILY   isosorbide mononitrate (IMDUR) 30 MG 24 hr tablet Take 1 tablet (30 mg total) by mouth daily.   levothyroxine (SYNTHROID, LEVOTHROID) 175 MCG tablet Take 175 mcg by mouth daily before breakfast.   lidocaine (ASPERCREME LIDOCAINE) 4 % Place 1 patch onto the skin daily as needed (pain).   loratadine (CLARITIN) 10 MG tablet Take 10 mg by mouth at bedtime.   magnesium oxide (MAG-OX) 400 (240 Mg) MG tablet Take 400 mg by mouth every evening.   meclizine (ANTIVERT) 12.5 MG tablet Take 1 tablet (12.5 mg total) by mouth 3 (three) times daily as needed for dizziness.   Multiple Vitamins-Minerals (PRESERVISION AREDS 2) CAPS Take 1 capsule by mouth 2 (two) times daily.   nitroGLYCERIN (NITROSTAT) 0.4 MG SL tablet Place 1 tablet (0.4 mg total) under the tongue every 5 (five) minutes as needed for chest pain.   olmesartan (BENICAR) 40 MG tablet TAKE 1 TABLET BY MOUTH EVERY DAY   pantoprazole (PROTONIX) 40 MG tablet TAKE 1 TABLET BY MOUTH DAILY   Plant Sterols and Stanols (CHOLESTOFF PLUS PO) Take 900 mg by mouth daily.   Polyethyl Glycol-Propyl Glycol (SYSTANE OP) Place 1 drop into both eyes daily as needed (dry/irritated eyes).   tamsulosin (FLOMAX) 0.4 MG CAPS capsule Take 1 capsule (0.4 mg total) by mouth daily. (Patient taking differently: Take 0.4 mg by mouth every other day.)   triamcinolone cream (KENALOG) 0.1 % Apply 1 application  topically daily as needed (Psoriasis).   trolamine salicylate (ASPERCREME) 10 % cream Apply 1 Application topically as needed for muscle pain.   TURMERIC PO Take 1 tablet by mouth daily. 500 mg /50 mg with ginger   VASCEPA 1 g capsule TAKE 2 CAPSULES BY MOUTH TWICE A DAY      ROS:   Please see the history of present illness.    All other systems reviewed and are negative.  EKGs       Risk Assessment/Calculations:    CHA2DS2-VASc Score = 5   This indicates a 7.2% annual risk of  stroke. The patient's score is based upon: CHF History: 0 HTN History: 1 Diabetes History: 1 Stroke History: 0 Vascular Disease History: 1 Age Score: 2 Gender Score: 0           Physical Exam:    VS:  BP 128/80   Pulse 99   Resp (!) 99  Ht 5' 9.5" (1.765 m)   Wt 189 lb 9.6 oz (86 kg)   SpO2 96%   BMI 27.60 kg/m     Wt Readings from Last 3 Encounters:  02/13/24 189 lb 9.6 oz (86 kg)  01/28/24 190 lb (86.2 kg)  12/20/23 190 lb (86.2 kg)     GEN: Well nourished, well developed in no acute distress NECK: No JVD CARDIAC: irreg irreg, no murmurs, rubs, gallops RESPIRATORY:  Clear to auscultation without rales, wheezing or rhonchi  ABDOMEN: Soft, non-tender, non-distended EXTREMITIES:  No edema; No deformity.    ASSESSMENT:    1. Persistent atrial fibrillation (HCC)   2. Coronary artery disease of native artery of native heart with stable angina pectoris (HCC)   3. Essential hypertension     PLAN:    In order of problems listed above:  Persistent atrial fibrillation:  -- Rate well controlled. -- Continue Coreg 6.25mg  BID. -- Continue Eliquis 5mg  BID. -- Watchman case aborted due to shallow anatomy. -- Pt is interested in referral to [facility] for consideration of Amulet.    CAD:  -- S/p CABG. -- No chest pain.  -- Continue Plavix 75mg  daily. -- Continue atorvastatin 80mg  daily.  -- Continue imdur 30mg  daily.    HTN: -- Well controlled today.  -- Continue current regimen.    Medication Adjustments/Labs and Tests Ordered: Current medicines are reviewed at length with the patient today.  Concerns regarding medicines are outlined above.  No orders of the defined types were placed in this encounter.  No orders of the defined types were placed in this encounter.   Patient Instructions  Medication Instructions:  Your physician recommends that you continue on your current medications as directed. Please refer to the Current Medication list given to  you today.  *If you need a refill on your cardiac medications before your next appointment, please call your pharmacy*  Lab Work: NONE If you have labs (blood work) drawn today and your tests are completely normal, you will receive your results only by: MyChart Message (if you have MyChart) OR A paper copy in the mail If you have any lab test that is abnormal or we need to change your treatment, we will call you to review the results.  Testing/Procedures: NONE  Follow-Up: At [facility], you and your health needs are our priority.  As part of our continuing mission to provide you with exceptional heart care, our providers are all part of one team.  This team includes your primary Cardiologist (physician) and Advanced Practice Providers or APPs (Physician Assistants and Nurse Practitioners) who all work together to provide you with the care you need, when you need it.  Your next appointment:   YOU ARE BEING REFERRED TO [facility] FOR AN AMULET DEVICE  We recommend signing up for the patient portal called "MyChart".  Sign up information is provided on this After Visit Summary.  MyChart is used to connect with patients for Virtual Visits (Telemedicine).  Patients are able to view lab/test results, encounter notes, upcoming appointments, etc.  Non-urgent messages can be sent to your provider as well.   To learn more about what you can do with MyChart, go to [email or web address].   Other Instructions       1st Floor: - Lobby - Registration  - Pharmacy  - Lab - Cafe  2nd Floor: - PV Lab - Diagnostic Testing (echo, CT, nuclear med)  3rd Floor: - Vacant  4th Floor: - TCTS (cardiothoracic  surgery) - AFib Clinic - Structural Heart Clinic - Vascular Surgery  - Vascular Ultrasound  5th Floor: - HeartCare Cardiology (general and EP) - Clinical Pharmacy for coumadin, hypertension, lipid, weight-loss medications, and med management appointments    Valet parking  services will be available as well.      Signed, Cline Crock, PA-C  02/13/2024 2:22 PM    [facility]

## 2024-02-13 ENCOUNTER — Ambulatory Visit: Payer: Medicare Other | Attending: Cardiovascular Disease | Admitting: Physician Assistant

## 2024-02-13 VITALS — BP 128/80 | HR 99 | Resp 99 | Ht 69.5 in | Wt 189.6 lb

## 2024-02-13 DIAGNOSIS — I4819 Other persistent atrial fibrillation: Secondary | ICD-10-CM | POA: Diagnosis not present

## 2024-02-13 DIAGNOSIS — I480 Paroxysmal atrial fibrillation: Secondary | ICD-10-CM

## 2024-02-13 DIAGNOSIS — I25118 Atherosclerotic heart disease of native coronary artery with other forms of angina pectoris: Secondary | ICD-10-CM

## 2024-02-13 DIAGNOSIS — I10 Essential (primary) hypertension: Secondary | ICD-10-CM

## 2024-02-13 NOTE — Patient Instructions (Signed)
 Medication Instructions:  Your physician recommends that you continue on your current medications as directed. Please refer to the Current Medication list given to you today.  *If you need a refill on your cardiac medications before your next appointment, please call your pharmacy*  Lab Work: NONE If you have labs (blood work) drawn today and your tests are completely normal, you will receive your results only by: MyChart Message (if you have MyChart) OR A paper copy in the mail If you have any lab test that is abnormal or we need to change your treatment, we will call you to review the results.  Testing/Procedures: NONE  Follow-Up: At [facility], you and your health needs are our priority.  As part of our continuing mission to provide you with exceptional heart care, our providers are all part of one team.  This team includes your primary Cardiologist (physician) and Advanced Practice Providers or APPs (Physician Assistants and Nurse Practitioners) who all work together to provide you with the care you need, when you need it.  Your next appointment:   YOU ARE BEING REFERRED TO [facility] FOR AN AMULET DEVICE  We recommend signing up for the patient portal called "MyChart".  Sign up information is provided on this After Visit Summary.  MyChart is used to connect with patients for Virtual Visits (Telemedicine).  Patients are able to view lab/test results, encounter notes, upcoming appointments, etc.  Non-urgent messages can be sent to your provider as well.   To learn more about what you can do with MyChart, go to [email or web address].   Other Instructions       1st Floor: - Lobby - Registration  - Pharmacy  - Lab - Cafe  2nd Floor: - PV Lab - Diagnostic Testing (echo, CT, nuclear med)  3rd Floor: - Vacant  4th Floor: - TCTS (cardiothoracic surgery) - AFib Clinic - Structural Heart Clinic - Vascular Surgery  - Vascular Ultrasound  5th Floor: - HeartCare  Cardiology (general and EP) - Clinical Pharmacy for coumadin, hypertension, lipid, weight-loss medications, and med management appointments    Valet parking services will be available as well.

## 2024-02-22 ENCOUNTER — Other Ambulatory Visit: Payer: Self-pay | Admitting: Internal Medicine

## 2024-02-26 DIAGNOSIS — I4811 Longstanding persistent atrial fibrillation: Secondary | ICD-10-CM | POA: Diagnosis not present

## 2024-03-05 DIAGNOSIS — E119 Type 2 diabetes mellitus without complications: Secondary | ICD-10-CM | POA: Diagnosis not present

## 2024-03-05 DIAGNOSIS — H35372 Puckering of macula, left eye: Secondary | ICD-10-CM | POA: Diagnosis not present

## 2024-03-05 DIAGNOSIS — H43812 Vitreous degeneration, left eye: Secondary | ICD-10-CM | POA: Diagnosis not present

## 2024-03-05 DIAGNOSIS — H353132 Nonexudative age-related macular degeneration, bilateral, intermediate dry stage: Secondary | ICD-10-CM | POA: Diagnosis not present

## 2024-03-05 DIAGNOSIS — H43821 Vitreomacular adhesion, right eye: Secondary | ICD-10-CM | POA: Diagnosis not present

## 2024-03-05 DIAGNOSIS — H43392 Other vitreous opacities, left eye: Secondary | ICD-10-CM | POA: Diagnosis not present

## 2024-03-06 DIAGNOSIS — I4891 Unspecified atrial fibrillation: Secondary | ICD-10-CM | POA: Diagnosis not present

## 2024-03-13 ENCOUNTER — Other Ambulatory Visit: Payer: Self-pay | Admitting: Cardiology

## 2024-03-19 ENCOUNTER — Other Ambulatory Visit: Payer: Self-pay | Admitting: Cardiology

## 2024-03-19 DIAGNOSIS — Z961 Presence of intraocular lens: Secondary | ICD-10-CM | POA: Diagnosis not present

## 2024-03-19 DIAGNOSIS — I480 Paroxysmal atrial fibrillation: Secondary | ICD-10-CM

## 2024-03-19 DIAGNOSIS — H35373 Puckering of macula, bilateral: Secondary | ICD-10-CM | POA: Diagnosis not present

## 2024-03-19 DIAGNOSIS — H353131 Nonexudative age-related macular degeneration, bilateral, early dry stage: Secondary | ICD-10-CM | POA: Diagnosis not present

## 2024-03-19 NOTE — Telephone Encounter (Signed)
 Prescription refill request for Eliquis  received. Indication:AFIB Last office visit:4/25 Scr:1.91  3/25 Age: 83 Weight:86  kg  Prescription refilled

## 2024-03-25 ENCOUNTER — Encounter: Payer: Self-pay | Admitting: Internal Medicine

## 2024-03-25 ENCOUNTER — Ambulatory Visit: Payer: Medicare Other | Admitting: Internal Medicine

## 2024-03-25 VITALS — BP 132/80 | HR 78 | Ht 69.5 in | Wt 190.0 lb

## 2024-03-25 DIAGNOSIS — E1142 Type 2 diabetes mellitus with diabetic polyneuropathy: Secondary | ICD-10-CM | POA: Diagnosis not present

## 2024-03-25 DIAGNOSIS — E1122 Type 2 diabetes mellitus with diabetic chronic kidney disease: Secondary | ICD-10-CM | POA: Diagnosis not present

## 2024-03-25 DIAGNOSIS — N1832 Chronic kidney disease, stage 3b: Secondary | ICD-10-CM | POA: Diagnosis not present

## 2024-03-25 DIAGNOSIS — E1159 Type 2 diabetes mellitus with other circulatory complications: Secondary | ICD-10-CM | POA: Diagnosis not present

## 2024-03-25 DIAGNOSIS — Z7984 Long term (current) use of oral hypoglycemic drugs: Secondary | ICD-10-CM

## 2024-03-25 LAB — POCT GLYCOSYLATED HEMOGLOBIN (HGB A1C): Hemoglobin A1C: 6 % — AB (ref 4.0–5.6)

## 2024-03-25 MED ORDER — GLIPIZIDE 5 MG PO TABS: 5.0000 mg | ORAL_TABLET | Freq: Every day | ORAL | 3 refills | Status: DC

## 2024-03-25 NOTE — Patient Instructions (Signed)
-   Change  Glipizide  5 mg , 1 tablet Supper  - Continue  Farxiga  10 mg daily       HOW TO TREAT LOW BLOOD SUGARS (Blood sugar LESS THAN 70 MG/DL) Please follow the RULE OF 15 for the treatment of hypoglycemia treatment (when your (blood sugars are less than 70 mg/dL)   STEP 1: Take 15 grams of carbohydrates when your blood sugar is low, which includes:  3-4 GLUCOSE TABS  OR 3-4 OZ OF JUICE OR REGULAR SODA OR ONE TUBE OF GLUCOSE GEL    STEP 2: RECHECK blood sugar in 15 MINUTES STEP 3: If your blood sugar is still low at the 15 minute recheck --> then, go back to STEP 1 and treat AGAIN with another 15 grams of carbohydrates.

## 2024-03-25 NOTE — Progress Notes (Signed)
 Name: Garrett Jordan  Age/ Sex: 83 y.o., male   MRN/ DOB: [id number], 1941-08-04     PCP: Rae Bugler, MD   Reason for Endocrinology Evaluation: Type 2 Diabetes Mellitus     Initial Endocrinology Clinic Visit:  03/17/2020    PATIENT IDENTIFIER: [name]. is a 83 y.o. male with a past medical history of T2DM, RLS, CAD and CKD. The patient has followed with Endocrinology clinic since 03/17/2020 for consultative assistance with management of his diabetes.      DIABETIC HISTORY:  Garrett Jordan was diagnosed with T2 DM in 2019. Invokana- cost prohibitive . His hemoglobin A1c has ranged from 6.7% in 2020, peaking at 7.8 % in 2021    On his initial visit to our clinic his A1c was 6.5 % , he was on Januvia which was continued, Invokana was cost prohibitive and we switched it to Glipizide .   Januvia stopped 06/2020 due to high cost for pt  Jardiance started by nephrology by 9/2021but this was temporarily held by cardiology due to elevated Cr but restarted 09/2022    HYPERCALCEMIA HISTORY: Garrett Jordan indicates that he was first noted with hypercalcemia in 2020. He has hx of kidney stones  He is S/P Left parathyroidectomy in 1970's  Ca/Cr ratio 0.0210  SUBJECTIVE:    During the last visit (09/25/2023): A1c 6.8%   Today (03/25/2024): Garrett Jordan is here for a follow up on diabetes management.  He checks his blood sugars 2 times daily . The patient has not had hypoglycemic episodes since the last clinic visit.   He continues to follow-up with cardiology for paroxysmal A-fib, CAD, s/p CABG and HTN.  He is s/p left atrial appendage occlusion 12/2023   Patient presented to the ED 01/2024 for hypotension, was noted with a BUN of 24, creatinine 1.91, and EGFR of 35   He follows with dentistry for hx of broken tooth , new crown places  Denies nausea or vomiting  Denies constipation or diarrhea  Has occasional palpitations   HOME DIABETES REGIMEN:  Glipizide  5 mg, 1  tablet before Breakfast and before Supper  Farxiga  10 mg daily     Statin: yes ACE-I/ARB: yes     METER DOWNLOAD SUMMARY:Unable to download   This am 120 mg/dL   78-295  DIABETIC COMPLICATIONS: Microvascular complications:  CKD III Denies: retinopathy, neuropathy  Last Eye Exam: Completed 01/30/2023  Macrovascular complications:  CAD (s/p CABG 2010), NSTEMI 06/2022 Denies: CVA, PVD       PHYSICAL EXAM: VS: BP 132/80 (BP Location: Left Arm, Patient Position: Sitting, Cuff Size: Normal)   Pulse 78   Ht 5' 9.5" (1.765 m)   Wt 190 lb (86.2 kg)   SpO2 97%   BMI 27.66 kg/m    EXAM: General: Pt appears well and is in NAD  Lungs: Clear with good BS bilat   Heart: Auscultation: RRR  Extremities:  BL LE:no pretibial edema   Mental Status: Judgment, insight: intact Orientation: oriented to time, place, and person Mood and affect: no depression, anxiety, or agitation    DM Foot Exam 09/25/2023  The skin of the feet is intact without sores or ulcerations. The pedal pulses are 1+ on right and 1+ on left. The sensation is intact to a screening 5.07, 10 gram monofilament bilaterally    HISTORY:  Past Medical History:  Past Medical History:  Diagnosis Date   AAA (abdominal aortic aneurysm) (HCC)    Arthritis  knees   Atrial fibrillation (HCC)    CAD (coronary artery disease)    Cancer (HCC)    prostate   CKD (chronic kidney disease), stage III (HCC) 06/18/2022   COVID    06/2019 and in 2022 states they were mild cases   Diabetes mellitus without complication (HCC)    Dyspnea    once in a while   Dysrhythmia    A-fib pt is on Eliquis    GERD (gastroesophageal reflux disease)    Gout    History of kidney stones    Hyperlipidemia    Hypertension    Hypothyroidism    Myocardial infarction (HCC) 01/1991   sp inferior   Nerve compression    right leg   Reflux    Seasonal allergies    Thoracic ascending aortic aneurysm (HCC)    Thyroid  disease     hypothyroidism   Past Surgical History:  Past Surgical History:  Procedure Laterality Date   ABDOMINAL AORTAGRAM N/A 02/28/2012   Procedure: ABDOMINAL Tommi Fraise;  Surgeon: Margherita Shell, MD;  Location: [facility] CATH LAB;  Service: Cardiovascular;  Laterality: N/A;   abdominal aortagram embolization  02/28/2012   ABDOMINAL AORTIC ANEURYSM REPAIR  07/14/2012   BIOPSY  06/28/2023   Procedure: BIOPSY;  Surgeon: Baldo Bonds, MD;  Location: [facility] ENDOSCOPY;  Service: Gastroenterology;;   CARDIAC CATHETERIZATION     CATARACT EXTRACTION Bilateral 2016   COLONOSCOPY WITH PROPOFOL  N/A 06/28/2023   Procedure: COLONOSCOPY WITH PROPOFOL ;  Surgeon: Baldo Bonds, MD;  Location: [facility] ENDOSCOPY;  Service: Gastroenterology;  Laterality: N/A;   CORONARY ARTERY BYPASS GRAFT  04/13/2009   CORONARY BALLOON ANGIOPLASTY N/A 06/15/2022   Procedure: CORONARY BALLOON ANGIOPLASTY;  Surgeon: Arnoldo Lapping, MD;  Location: [facility] INVASIVE CV LAB;  Service: Cardiovascular;  Laterality: N/A;   CORONARY STENT PLACEMENT  1992 and  2009   RCA   CYSTOSCOPY W/ URETERAL STENT PLACEMENT Right 04/24/2023   Procedure: CYSTOSCOPY WITH RETROGRADE PYELOGRAM/URETERAL STENT PLACEMENT;  Surgeon: Andrez Banker, MD;  Location: [facility] OR;  Service: Urology;  Laterality: Right;   CYSTOSCOPY/URETEROSCOPY/HOLMIUM LASER/STENT PLACEMENT Right 05/03/2023   Procedure: CYSTOSCOPY/URETEROSCOPY/HOLMIUM LASER/STONE EXTRACTION/STENT REMOVAL, FULGURATION;  Surgeon: Trent Frizzle, MD;  Location: [facility] ORS;  Service: Urology;  Laterality: Right;  1 HR   EMBOLIZATION Right 02/28/2012   Procedure: EMBOLIZATION;  Surgeon: Margherita Shell, MD;  Location: [facility] CATH LAB;  Service: Cardiovascular;  Laterality: Right;   IR GENERIC HISTORICAL  12/05/2016   IR RADIOLOGIST EVAL & MGMT 12/05/2016 Fernando Hoyer, MD [facility] INTERV RAD   IR RADIOLOGIST EVAL & MGMT  07/10/2019   IR RADIOLOGIST EVAL & MGMT  08/25/2020   IR RADIOLOGIST EVAL & MGMT  08/16/2021   IR  RADIOLOGIST EVAL & MGMT  08/31/2022   KIDNEY STONE SURGERY     LAMINECTOMY WITH POSTERIOR LATERAL ARTHRODESIS LEVEL 1 Bilateral 11/10/2022   Procedure: Lumbar Two-Lumbar Three laminectomy  - bilateral with posterior lateral fusion and interspinous plating;  Surgeon: Isadora Mar, MD;  Location: [facility] OR;  Service: Neurosurgery;  Laterality: Bilateral;   LEFT ATRIAL APPENDAGE OCCLUSION N/A 12/20/2023   Procedure: LEFT ATRIAL APPENDAGE OCCLUSION;  Surgeon: Boyce Byes, MD;  Location: [facility] INVASIVE CV LAB;  Service: Cardiovascular;  Laterality: N/A;   LEFT HEART CATH AND CORONARY ANGIOGRAPHY N/A 06/15/2022   Procedure: LEFT HEART CATH AND CORONARY ANGIOGRAPHY;  Surgeon: Arnoldo Lapping, MD;  Location: [facility] INVASIVE CV LAB;  Service: Cardiovascular;  Laterality: N/A;   LEFT HEART CATHETERIZATION WITH CORONARY/GRAFT ANGIOGRAM N/A 01/08/2015  Procedure: LEFT HEART CATHETERIZATION WITH Estella Helling;  Surgeon: Dorsey Gault, MD;  Location: [facility] CATH LAB;  Service: Cardiovascular;  Laterality: N/A;   parathyroid  adenoma  1981   surgery   POLYPECTOMY  06/28/2023   Procedure: POLYPECTOMY;  Surgeon: Baldo Bonds, MD;  Location: [facility] ENDOSCOPY;  Service: Gastroenterology;;   PROSTATE SURGERY     rad seeds   SPINE SURGERY  2006   TONSILLECTOMY  1955   TRANSESOPHAGEAL ECHOCARDIOGRAM (CATH LAB) N/A 12/20/2023   Procedure: TRANSESOPHAGEAL ECHOCARDIOGRAM;  Surgeon: Boyce Byes, MD;  Location: [facility] INVASIVE CV LAB;  Service: Cardiovascular;  Laterality: N/A;   Social History:  reports that he quit smoking about 53 years ago. His smoking use included cigarettes. He has never used smokeless tobacco. He reports current alcohol use of about 6.0 standard drinks of alcohol per week. He reports that he does not use drugs. Family History:  Family History  Problem Relation Age of Onset   Heart disease Father        Heart Disease before age 30   Heart attack Father    Hyperlipidemia Father     Hypertension Father    Stroke Mother    Deep vein thrombosis Mother    Heart disease Sister    Diabetes Sister    Hyperlipidemia Sister    Hypertension Sister    Heart attack Sister    Heart disease Paternal Uncle    Diabetes Maternal Grandmother    Diabetes Sister      HOME MEDICATIONS: Allergies as of 03/25/2024       Reactions   Quinolones Other (See Comments)   Aortic root enlargement   Ace Inhibitors Cough   Crestor  [rosuvastatin ] Other (See Comments)   Muscle aches   Nitroglycerin  Other (See Comments)   Very pronounced lowered B/P with IV nitro for heart caths        Medication List        Accurate as of Mar 25, 2024  7:58 AM. If you have any questions, ask your nurse or Jordan.          STOP taking these medications    meclizine  12.5 MG tablet Commonly known as: ANTIVERT  Stopped by: Ellenie Salome J Kaian Fahs       TAKE these medications    Accu-Chek Guide Test test strip Generic drug: glucose blood USE TO TEST TWICE DAILY   acetaminophen  650 MG CR tablet Commonly known as: TYLENOL  Take 1,950 mg by mouth 2 (two) times daily as needed for pain.   allopurinol  300 MG tablet Commonly known as: ZYLOPRIM  Take 300 mg by mouth every other day.   amLODipine  10 MG tablet Commonly known as: NORVASC  TAKE 1 TABLET BY MOUTH DAILY   Aspercreme Lidocaine  4 % Generic drug: lidocaine  Place 1 patch onto the skin daily as needed (pain).   atorvastatin  40 MG tablet Commonly known as: LIPITOR TAKE 1 TABLET BY MOUTH EVERY DAY   carvedilol  6.25 MG tablet Commonly known as: COREG  TAKE 1 TABLET BY MOUTH TWICE A DAY   CHOLESTOFF PLUS PO Take 900 mg by mouth daily.   clonazePAM  1 MG tablet Commonly known as: KLONOPIN  Take 0.5-1 mg by mouth at bedtime.   clopidogrel  75 MG tablet Commonly known as: PLAVIX  TAKE 1 TABLET BY MOUTH DAILY   Coenzyme Q10 300 MG Caps Take 300 mg by mouth in the morning.   dapagliflozin  propanediol 10 MG Tabs tablet Commonly  known as: Farxiga  Take 1 tablet (10 mg total) by mouth daily  before breakfast. What changed: how much to take   Eliquis  5 MG Tabs tablet Generic drug: apixaban  TAKE 1 TABLET BY MOUTH TWICE A DAY   ezetimibe  10 MG tablet Commonly known as: ZETIA  TAKE 1 TABLET BY MOUTH DAILY   fenofibrate  micronized 200 MG capsule Commonly known as: LOFIBRA Take 1 capsule (200 mg total) by mouth daily in the afternoon.   fluorouracil 5 % cream Commonly known as: EFUDEX Apply 1 Application topically daily as needed (Apply to affected area of the face 2 times a day for skin spots).   fluticasone  50 MCG/ACT nasal spray Commonly known as: FLONASE  Place 2 sprays into both nostrils daily as needed for allergies.   glipiZIDE  5 MG tablet Commonly known as: GLUCOTROL  Take 1 tablet (5 mg total) by mouth 2 (two) times daily before a meal.   isosorbide  mononitrate 30 MG 24 hr tablet Commonly known as: IMDUR  Take 1 tablet (30 mg total) by mouth daily.   levothyroxine  175 MCG tablet Commonly known as: SYNTHROID  Take 175 mcg by mouth daily before breakfast.   loratadine  10 MG tablet Commonly known as: CLARITIN  Take 10 mg by mouth at bedtime.   magnesium  oxide 400 (240 Mg) MG tablet Commonly known as: MAG-OX Take 400 mg by mouth every evening.   nitroGLYCERIN  0.4 MG SL tablet Commonly known as: Nitrostat  Place 1 tablet (0.4 mg total) under the tongue every 5 (five) minutes as needed for chest pain.   olmesartan  40 MG tablet Commonly known as: BENICAR  TAKE 1 TABLET BY MOUTH EVERY DAY   pantoprazole  40 MG tablet Commonly known as: PROTONIX  TAKE 1 TABLET BY MOUTH DAILY   PreserVision AREDS 2 Caps Take 1 capsule by mouth 2 (two) times daily.   SYSTANE OP Place 1 drop into both eyes daily as needed (dry/irritated eyes).   tamsulosin  0.4 MG Caps capsule Commonly known as: FLOMAX  Take 1 capsule (0.4 mg total) by mouth daily. What changed: when to take this   triamcinolone cream 0.1  % Commonly known as: KENALOG Apply 1 application  topically daily as needed (Psoriasis).   trolamine salicylate 10 % cream Commonly known as: ASPERCREME Apply 1 Application topically as needed for muscle pain.   TURMERIC PO Take 1 tablet by mouth daily. 500 mg /50 mg with ginger   Vascepa  1 g capsule Generic drug: icosapent  Ethyl TAKE 2 CAPSULES BY MOUTH TWICE A DAY   Vitamin B-12 5000 MCG Tbdp Take 5,000 mcg by mouth daily.   Vitamin C 500 MG Caps Take 500 mg by mouth every evening.   Vitamin D3 50 MCG (2000 UT) Tabs Take 2,000 Units by mouth daily.        DM foot exam: 09/25/2023   The skin of the feet is intact without sores or ulcerations. The pedal pulses are 2+ on right and 2+ on left. The sensation is decreased to a screening 5.07, 10 gram monofilament at the left heel     DATA REVIEWED:  Lab Results  Component Value Date   HGBA1C 6.0 (A) 03/25/2024   HGBA1C 6.8 (A) 09/25/2023   HGBA1C 6.1 (H) 04/25/2023     Latest Reference Range & Units 01/28/24 12:56  Sodium 135 - 145 mmol/L 135  Potassium 3.5 - 5.1 mmol/L 4.0  Chloride 98 - 111 mmol/L 105  CO2 22 - 32 mmol/L 20 (L)  Glucose 70 - 99 mg/dL 161 (H)  BUN 8 - 23 mg/dL 24 (H)  Creatinine 0.96 - 1.24 mg/dL 0.45 (H)  Calcium  8.9 - 10.3 mg/dL 9.3  Anion gap 5 - 15  10  Alkaline Phosphatase 38 - 126 U/L 73  Albumin 3.5 - 5.0 g/dL 3.5  Lipase 11 - 51 U/L 46  AST 15 - 41 U/L 47 (H)  ALT 0 - 44 U/L 20  Total Protein 6.5 - 8.1 g/dL 6.0 (L)  Total Bilirubin 0.0 - 1.2 mg/dL 2.0 (H)  GFR, Estimated >60 mL/min 35 (L)     ASSESSMENT / PLAN / RECOMMENDATIONS:   1) Type 2 Diabetes Mellitus, Optimally controlled, With CKD III , neuropathic and macrovascular complications - Most recent A1c of 6.0 %. Goal A1c < 7.0%.   - His A1c has decreased from 6.8% to 6.0% -No hypoglycemia, but due to increased risk of hypoglycemia with such a low A1c I have suggested we discontinue the morning glipizide  and he will  continue to take glipizide  at suppertime as supper is his biggest meal -Patient encouraged to check glucose before breakfast and supper, and to contact our office should his Premeal supper BG >180 mg/dL  -- He did not qualify for patient assistance program for Farxiga      MEDICATIONS:  Change glipizide  5 mg, 1  tablet  before supper  Continue Farxiga  10 mg daily    EDUCATION / INSTRUCTIONS: BG monitoring instructions: Patient is instructed to check his blood sugars 2 times a day, before breakfast and supper . Call [place] [facility] if: BG persistently < 70  I reviewed the Rule of 15 for the treatment of hypoglycemia in detail with the patient. Literature supplied.     2) Diabetic complications:  Eye: Does not have known diabetic retinopathy.  Neuro/ Feet: Does have known diabetic peripheral neuropathy .  Renal: Patient does  have known baseline CKD. He   is on an ACEI/ARB at present.     3) Dyslipidemia/CAD:  -Per cardiology - LDL at goal    F/U in 4 months    Signed electronically by: Natale Bail, MD  [facility]  [facility] [address] [place], [place] [address] Phone: [phone] FAX: [phone]   CC: Rae Bugler, MD [address] [place] [address] Phone: [phone]  Fax: [phone]  Return to Endocrinology clinic as below: No future appointments.

## 2024-03-27 ENCOUNTER — Encounter: Payer: Self-pay | Admitting: Cardiology

## 2024-03-27 DIAGNOSIS — N1832 Chronic kidney disease, stage 3b: Secondary | ICD-10-CM | POA: Diagnosis not present

## 2024-03-28 ENCOUNTER — Other Ambulatory Visit: Payer: Self-pay | Admitting: Cardiology

## 2024-03-28 DIAGNOSIS — R310 Gross hematuria: Secondary | ICD-10-CM | POA: Diagnosis not present

## 2024-03-31 ENCOUNTER — Other Ambulatory Visit: Payer: Self-pay | Admitting: Nephrology

## 2024-03-31 DIAGNOSIS — R319 Hematuria, unspecified: Secondary | ICD-10-CM

## 2024-04-01 ENCOUNTER — Ambulatory Visit
Admission: RE | Admit: 2024-04-01 | Discharge: 2024-04-01 | Disposition: A | Discharge status: Home / Self Care | Source: Ambulatory Visit | Attending: Nephrology | Admitting: Nephrology

## 2024-04-01 DIAGNOSIS — R319 Hematuria, unspecified: Secondary | ICD-10-CM

## 2024-04-01 DIAGNOSIS — N281 Cyst of kidney, acquired: Secondary | ICD-10-CM | POA: Diagnosis not present

## 2024-04-02 DIAGNOSIS — D225 Melanocytic nevi of trunk: Secondary | ICD-10-CM | POA: Diagnosis not present

## 2024-04-02 DIAGNOSIS — Z85828 Personal history of other malignant neoplasm of skin: Secondary | ICD-10-CM | POA: Diagnosis not present

## 2024-04-02 DIAGNOSIS — D692 Other nonthrombocytopenic purpura: Secondary | ICD-10-CM | POA: Diagnosis not present

## 2024-04-02 DIAGNOSIS — D1801 Hemangioma of skin and subcutaneous tissue: Secondary | ICD-10-CM | POA: Diagnosis not present

## 2024-04-02 DIAGNOSIS — L821 Other seborrheic keratosis: Secondary | ICD-10-CM | POA: Diagnosis not present

## 2024-04-02 DIAGNOSIS — L814 Other melanin hyperpigmentation: Secondary | ICD-10-CM | POA: Diagnosis not present

## 2024-04-02 DIAGNOSIS — D2372 Other benign neoplasm of skin of left lower limb, including hip: Secondary | ICD-10-CM | POA: Diagnosis not present

## 2024-04-02 DIAGNOSIS — L308 Other specified dermatitis: Secondary | ICD-10-CM | POA: Diagnosis not present

## 2024-04-02 DIAGNOSIS — L570 Actinic keratosis: Secondary | ICD-10-CM | POA: Diagnosis not present

## 2024-04-02 DIAGNOSIS — D2262 Melanocytic nevi of left upper limb, including shoulder: Secondary | ICD-10-CM | POA: Diagnosis not present

## 2024-04-04 DIAGNOSIS — E782 Mixed hyperlipidemia: Secondary | ICD-10-CM | POA: Diagnosis not present

## 2024-04-04 DIAGNOSIS — I10 Essential (primary) hypertension: Secondary | ICD-10-CM | POA: Diagnosis not present

## 2024-04-04 DIAGNOSIS — M109 Gout, unspecified: Secondary | ICD-10-CM | POA: Diagnosis not present

## 2024-04-04 DIAGNOSIS — E1122 Type 2 diabetes mellitus with diabetic chronic kidney disease: Secondary | ICD-10-CM | POA: Diagnosis not present

## 2024-04-04 DIAGNOSIS — E538 Deficiency of other specified B group vitamins: Secondary | ICD-10-CM | POA: Diagnosis not present

## 2024-04-04 DIAGNOSIS — E039 Hypothyroidism, unspecified: Secondary | ICD-10-CM | POA: Diagnosis not present

## 2024-04-04 DIAGNOSIS — N184 Chronic kidney disease, stage 4 (severe): Secondary | ICD-10-CM | POA: Diagnosis not present

## 2024-04-04 DIAGNOSIS — Z0000 Encounter for general adult medical examination without abnormal findings: Secondary | ICD-10-CM | POA: Diagnosis not present

## 2024-04-04 DIAGNOSIS — E559 Vitamin D deficiency, unspecified: Secondary | ICD-10-CM | POA: Diagnosis not present

## 2024-04-16 DIAGNOSIS — R310 Gross hematuria: Secondary | ICD-10-CM | POA: Diagnosis not present

## 2024-04-21 DIAGNOSIS — R310 Gross hematuria: Secondary | ICD-10-CM | POA: Diagnosis not present

## 2024-04-21 DIAGNOSIS — N200 Calculus of kidney: Secondary | ICD-10-CM | POA: Diagnosis not present

## 2024-04-21 DIAGNOSIS — I723 Aneurysm of iliac artery: Secondary | ICD-10-CM | POA: Diagnosis not present

## 2024-04-21 DIAGNOSIS — K8020 Calculus of gallbladder without cholecystitis without obstruction: Secondary | ICD-10-CM | POA: Diagnosis not present

## 2024-04-21 DIAGNOSIS — I714 Abdominal aortic aneurysm, without rupture, unspecified: Secondary | ICD-10-CM | POA: Diagnosis not present

## 2024-05-20 ENCOUNTER — Other Ambulatory Visit: Payer: Self-pay | Admitting: Cardiology

## 2024-05-21 DIAGNOSIS — R310 Gross hematuria: Secondary | ICD-10-CM | POA: Diagnosis not present

## 2024-05-21 DIAGNOSIS — N35819 Other urethral stricture, male, unspecified site: Secondary | ICD-10-CM | POA: Diagnosis not present

## 2024-05-21 DIAGNOSIS — Z87442 Personal history of urinary calculi: Secondary | ICD-10-CM | POA: Diagnosis not present

## 2024-05-27 ENCOUNTER — Other Ambulatory Visit: Payer: Self-pay | Admitting: Cardiology

## 2024-05-27 ENCOUNTER — Telehealth: Payer: Self-pay | Admitting: Cardiology

## 2024-05-27 NOTE — Telephone Encounter (Signed)
   Pre-operative Risk Assessment    Patient Name: Garrett HOHMANN Sr.  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 Shane Socks Group or Practice Name:  [facility] Phone number:  [phone] Fax number:  [phone]   Type of Clearance Requested:   - Medical  - Pharmacy:  Hold Apixaban  (Eliquis ) TBD   Type of Anesthesia:  General    Additional requests/questions:     SignedBarbee DELENA Sharps   05/27/2024, 8:43 AM

## 2024-05-30 ENCOUNTER — Telehealth: Payer: Self-pay

## 2024-05-30 NOTE — Telephone Encounter (Signed)
 Patient with diagnosis of atrial fibrillation on Eliquis  for anticoagulation.    rocedure:  Transurethral ventral inlay cystoscopy possible resection of bladder tumor    Date of Surgery:  Clearance TBD          CHA2DS2-VASc Score = 5   This indicates a 7.2% annual risk of stroke. The patient's score is based upon: CHF History: 0 HTN History: 1 Diabetes History: 1 Stroke History: 0 Vascular Disease History: 1 Age Score: 2 Gender Score: 0    CrCl 37 Platelet count 148  Patient has not had an Afib/aflutter ablation within the last 3 months or DCCV within the last 30 days  Per office protocol, patient can hold Eliquis  for 2-3 days prior to procedure.   Patient will not need bridging with Lovenox  (enoxaparin ) around procedure.  **This guidance is not considered finalized until pre-operative APP has relayed final recommendations.**

## 2024-05-30 NOTE — Telephone Encounter (Signed)
   Name: Garrett Jordan  DOB: Jun 09, 1941  MRN: [id number]  Primary Cardiologist: Oneil Parchment, MD   Preoperative team, please contact this patient and set up a phone call appointment for further preoperative risk assessment. Please obtain consent and complete medication review. Thank you for your help.  I confirm that guidance regarding antiplatelet and oral anticoagulation therapy has been completed and, if necessary, noted below.  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 The patient's score is based upon: CHF History: 0 HTN History: 1 Diabetes History: 1 Stroke History: 0 Vascular Disease History: 1 Age Score: 2 Gender Score: 0     CrCl 37 Platelet count 148   Patient has not had an Afib/aflutter ablation within the last 3 months or DCCV within the last 30 days   Per office protocol, patient can hold Eliquis  for 2-3 days prior to procedure.   Patient will not need bridging with Lovenox  (enoxaparin ) around procedure.    I also confirmed the patient resides in the state of [place] . As per [facility] telemedicine laws, the patient must reside in the state in which the provider is licensed.   Artist Pouch, PA-C 05/30/2024, 4:07 PM [facility]

## 2024-05-30 NOTE — Telephone Encounter (Signed)
 Spoke with the patient to schedule cardiac clearance appt. Pt states that his procedure has been postponed to early next year due to having a heart procedure in August. He is unable to have any other procedures 6 months following the heart procedure due to the need to be on his blood thinner. Pt states that he will call back at a later date to schedule preop clearance

## 2024-06-10 ENCOUNTER — Telehealth

## 2024-06-19 ENCOUNTER — Other Ambulatory Visit: Payer: Self-pay | Admitting: Family Medicine

## 2024-06-19 DIAGNOSIS — I4891 Unspecified atrial fibrillation: Secondary | ICD-10-CM | POA: Diagnosis not present

## 2024-06-19 DIAGNOSIS — Z8719 Personal history of other diseases of the digestive system: Secondary | ICD-10-CM | POA: Diagnosis not present

## 2024-06-19 DIAGNOSIS — Z7984 Long term (current) use of oral hypoglycemic drugs: Secondary | ICD-10-CM | POA: Diagnosis not present

## 2024-06-19 DIAGNOSIS — N184 Chronic kidney disease, stage 4 (severe): Secondary | ICD-10-CM | POA: Diagnosis not present

## 2024-06-19 DIAGNOSIS — Z01818 Encounter for other preprocedural examination: Secondary | ICD-10-CM | POA: Diagnosis not present

## 2024-06-19 DIAGNOSIS — I480 Paroxysmal atrial fibrillation: Secondary | ICD-10-CM | POA: Diagnosis not present

## 2024-06-19 DIAGNOSIS — I2510 Atherosclerotic heart disease of native coronary artery without angina pectoris: Secondary | ICD-10-CM | POA: Diagnosis not present

## 2024-06-19 DIAGNOSIS — R1011 Right upper quadrant pain: Secondary | ICD-10-CM | POA: Diagnosis not present

## 2024-06-19 DIAGNOSIS — I129 Hypertensive chronic kidney disease with stage 1 through stage 4 chronic kidney disease, or unspecified chronic kidney disease: Secondary | ICD-10-CM | POA: Diagnosis not present

## 2024-06-19 DIAGNOSIS — E119 Type 2 diabetes mellitus without complications: Secondary | ICD-10-CM | POA: Diagnosis not present

## 2024-06-19 NOTE — Telephone Encounter (Signed)
 Division of Pharmacy Services  Medication History Completion Note  Name/DOB/Age of Patient: Garrett Jordan / 21-Dec-1940 / 83 y.o.  Location: [facility]  Type: Presurgical & Modality: In-Person  Confirmed two patient identifiers: Yes  Confirmed patient is alert & oriented: Yes  Medication History Source (Med History Informants):  Patient Other Dr. Annemarie   Asked about any missing medications (such as pumps, injectable meds, TPN, OTC, etc): Yes  PTA Med List:  Prior to Admission Medications     Reviewed by Kelly Dorothyann Dolly, CPhT on 06/19/24 at 1541    Medication Sig Last Dose Informant Taking? Status  acetaminophen  (TYLENOL ) 650 mg ER tablet Take 1,300 mg by mouth 2 (two) times a day as needed (muscle pain). 06/19/2024 Self, Other Yes Active  allopurinoL  (ZYLOPRIM ) 300 mg tablet Take 300 mg by mouth every other day. 06/19/2024 Self, Other Yes Active     Discontinued 06/19/24 1520 (Error)  amLODIPine  (NORVASC ) 10 mg tablet Take 10 mg by mouth daily. 06/19/2024 Self, Other Yes Active  apixaban  (Eliquis ) 5 mg tab Take 5 mg by mouth 2 (two) times a day. 06/19/2024 Self, Other Yes Active         Med Note (BENNETT, ELISE C   Thu Jun 19, 2024  3:38 PM) Medication last fill date 05/23/2024 for #60/30 from [facility]    Discontinued 06/19/24 1533 (Therapy completed)  ascorbic acid (VITAMIN C) 1,000 mg tablet Take 1,000 mg by mouth every evening. 06/18/2024 Evening Self, Other Yes Active  carvediloL  (COREG ) 6.25 mg tablet Take 6.25 mg by mouth in the morning and 6.25 mg in the evening. Take with meals. with food. 06/19/2024 Self, Other Yes Active  cholecalciferol (VITAMIN D3) 2,000 unit tablet Take 2,000 Units by mouth every evening. 06/19/2024 Self, Other Yes Active  clonazePAM  (KlonoPIN ) 1 mg tablet Take 1 mg by mouth every evening. 06/18/2024 Evening Self, Other Yes Active         Med Note (BENNETT, ELISE C   Thu Jun 19, 2024  3:38 PM) Medication last fill date 03/03/2024 for #90/90 from [facility]       Discontinued 06/19/24 1533 (Therapy completed)  clopidogreL  (PLAVIX ) 75 mg tablet Take 75 mg by mouth every evening. 06/18/2024 Evening Self, Other Yes Active         Med Note (BENNETT, ELISE C   Thu Jun 19, 2024  3:38 PM) Medication last fill date 03/23/2024 for #100/100 from [facility]  coenzyme Q-10 (Co Q-10) 300 mg capsule Take 300 mg by mouth daily. 06/19/2024 Self, Other Yes Active  cyanocobalamin  (VITAMIN B12) 1,000 mcg tablet Take 8,000 mcg by mouth every evening. 06/19/2024 Self, Other Yes Active  dapagliflozin  propanediol (FARXIGA ) 10 mg tab tablet Take 10 mg by mouth daily. 06/19/2024 Self, Other Yes Active  ezetimibe  (ZETIA ) 10 mg tablet Take 10 mg by mouth daily. 06/19/2024 Self, Other Yes Active  fenofibrate  micronized (LOFIBRA) 200 mg capsule Take 200 mg by mouth daily. 06/19/2024 Self, Other Yes Active  fluorouraciL (EFUDEX) 5 % cream Apply topically as needed for cancer spots. 06/19/2024 Self, Other Yes Active  fluticasone  propionate (FLONASE ) 50 mcg/spray nasal spray Administer 2 sprays into each nostril daily as needed for allergies or rhinitis. 06/19/2024 Self, Other Yes Active  glipiZIDE  (GLUCOTROL ) 5 mg tablet Take 5 mg by mouth 2 (two) times a day. 06/19/2024 Self, Other Yes Active     Discontinued 06/19/24 1535 (Therapy completed)  isosorbide  mononitrate (IMDUR ) 30 mg 24 hr tablet Take 30 mg by mouth daily. 06/19/2024  Self, Other Yes Active      Discontinued 06/19/24 1535 (Therapy completed)  levothyroxine (SYNTHROID) 175 mcg tablet Take 175 mcg by mouth every morning. 06/19/2024 Self, Other Yes Active  loratadine (CLARITIN) 10 mg tablet Take 10 mg by mouth every evening. 06/19/2024 Self, Other Yes Active     Discontinued 06/19/24 1535 (Therapy completed)  magnesium oxide 400 mg tab tablet Take 400 mg by mouth daily. 06/19/2024 Self, Other Yes Active     Discontinued 06/19/24 1535 (Therapy completed)  olmesartan (BENICAR) 40 mg tablet Take 40 mg by mouth daily. 06/19/2024 Self, Other Yes Active       Discontinued 06/19/24 1529 (Therapy completed)  pantoprazole (PROTONIX) 40 mg EC tablet Take 40 mg by mouth every evening. 06/19/2024 Self, Other Yes Active  peg 400-propylene glycol (Systane, propylene glycoL,) 0.4-0.3 % drop ophthalmic solution Administer 1 drop into both eyes as needed for dry eyes. 06/19/2024 Self, Other Yes Active  phytosterol-pantethine (CholestOff Complete) 300-100 mg cap Take 3 capsules by mouth every evening. 06/19/2024 Self, Other Yes Active     Discontinued 06/19/24 1535 (Therapy completed)      Discontinued 06/19/24 1535 (Therapy completed)  tamsulosin (FLOMAX) 0.4 mg cap Take 0.4 mg by mouth every other day. In the evening. 06/19/2024 Self, Other Yes Active     Discontinued 06/19/24 1529 (Therapy completed)  triamcinolone acetonide (KENALOG) 0.1 % cream Apply 1 Application topically daily as needed for irritation or rash. 06/19/2024 Self, Other Yes Active  TURMERIC ROOT-GINGER ROOT EXT ORAL Take 500 mg by mouth every evening. 06/19/2024 Self, Other Yes Active  Vascepa 1 gram cap Take 2 g by mouth 2 (two) times a day. 06/19/2024 Self, Other Yes Active  vitamins A,C,E-zinc-copper (PreserVision AREDS) 4,296 mcg-226 mg-90 mg cap Take 1 capsule by mouth 2 (two) times a day. 06/19/2024 Self, Other Yes Active            Selected Pharmacy:  [facility]  Comments:  All medications and allergies were verified in the Pre-Op Assessment clinic by the patient who is a good historian, and last fills/doses were verified by Dr. Annemarie.  Medications removed: Amiodarone, Ibuprofen, Klor-Con, Loratadine-D, multi w/ minerals, Olmesartan-hydrochlorothiazide, Ropinirole, Spironolactone,Tradjenta (therapy complete); Eliquis and Clopidogrel (duplicates)  Medications flagged for removal: n/a  Reviewed by Certified Medication List Pharmacy Technician Electronically signed by: Kelly Dorothyann Dolly, CPhT 06/19/2024 3:41 PM  Anticipated Procedure Date: 07/02/2024  DPS  enrolled for delivery. Please call [phone] with questions for patients bedded in [facility] and [phone] for all other locations.    Medications reconciled by provider: No   Signature/Co-signature, if required: Kelly Dorothyann Dolly, CPhT   Date/Time: 06/19/2024 3:42 PM

## 2024-06-23 DIAGNOSIS — N1832 Chronic kidney disease, stage 3b: Secondary | ICD-10-CM | POA: Diagnosis not present

## 2024-06-25 ENCOUNTER — Ambulatory Visit
Admission: RE | Admit: 2024-06-25 | Discharge: 2024-06-25 | Disposition: A | Discharge status: Home / Self Care | Source: Ambulatory Visit | Attending: Family Medicine | Admitting: Family Medicine

## 2024-06-25 DIAGNOSIS — K8020 Calculus of gallbladder without cholecystitis without obstruction: Secondary | ICD-10-CM | POA: Diagnosis not present

## 2024-06-25 DIAGNOSIS — N281 Cyst of kidney, acquired: Secondary | ICD-10-CM | POA: Diagnosis not present

## 2024-06-25 DIAGNOSIS — R1011 Right upper quadrant pain: Secondary | ICD-10-CM

## 2024-06-27 DIAGNOSIS — I129 Hypertensive chronic kidney disease with stage 1 through stage 4 chronic kidney disease, or unspecified chronic kidney disease: Secondary | ICD-10-CM | POA: Diagnosis not present

## 2024-06-27 DIAGNOSIS — N1832 Chronic kidney disease, stage 3b: Secondary | ICD-10-CM | POA: Diagnosis not present

## 2024-06-27 DIAGNOSIS — R319 Hematuria, unspecified: Secondary | ICD-10-CM | POA: Diagnosis not present

## 2024-06-27 DIAGNOSIS — D631 Anemia in chronic kidney disease: Secondary | ICD-10-CM | POA: Diagnosis not present

## 2024-06-27 DIAGNOSIS — N281 Cyst of kidney, acquired: Secondary | ICD-10-CM | POA: Diagnosis not present

## 2024-06-27 DIAGNOSIS — E1122 Type 2 diabetes mellitus with diabetic chronic kidney disease: Secondary | ICD-10-CM | POA: Diagnosis not present

## 2024-06-27 DIAGNOSIS — N2581 Secondary hyperparathyroidism of renal origin: Secondary | ICD-10-CM | POA: Diagnosis not present

## 2024-07-02 DIAGNOSIS — K219 Gastro-esophageal reflux disease without esophagitis: Secondary | ICD-10-CM | POA: Diagnosis not present

## 2024-07-02 DIAGNOSIS — I445 Left posterior fascicular block: Secondary | ICD-10-CM | POA: Diagnosis not present

## 2024-07-02 DIAGNOSIS — E039 Hypothyroidism, unspecified: Secondary | ICD-10-CM | POA: Diagnosis not present

## 2024-07-02 DIAGNOSIS — I4510 Unspecified right bundle-branch block: Secondary | ICD-10-CM | POA: Diagnosis not present

## 2024-07-02 DIAGNOSIS — I4891 Unspecified atrial fibrillation: Secondary | ICD-10-CM | POA: Diagnosis not present

## 2024-07-02 DIAGNOSIS — Z006 Encounter for examination for normal comparison and control in clinical research program: Secondary | ICD-10-CM | POA: Diagnosis not present

## 2024-07-02 DIAGNOSIS — Z7982 Long term (current) use of aspirin: Secondary | ICD-10-CM | POA: Diagnosis not present

## 2024-07-02 DIAGNOSIS — J9811 Atelectasis: Secondary | ICD-10-CM | POA: Diagnosis not present

## 2024-07-02 DIAGNOSIS — I452 Bifascicular block: Secondary | ICD-10-CM | POA: Diagnosis not present

## 2024-07-02 DIAGNOSIS — I4519 Other right bundle-branch block: Secondary | ICD-10-CM | POA: Diagnosis not present

## 2024-07-02 DIAGNOSIS — Z7984 Long term (current) use of oral hypoglycemic drugs: Secondary | ICD-10-CM | POA: Diagnosis not present

## 2024-07-02 DIAGNOSIS — I480 Paroxysmal atrial fibrillation: Secondary | ICD-10-CM | POA: Diagnosis not present

## 2024-07-02 DIAGNOSIS — I2510 Atherosclerotic heart disease of native coronary artery without angina pectoris: Secondary | ICD-10-CM | POA: Diagnosis not present

## 2024-07-02 DIAGNOSIS — I4819 Other persistent atrial fibrillation: Secondary | ICD-10-CM | POA: Diagnosis not present

## 2024-07-02 DIAGNOSIS — Z79899 Other long term (current) drug therapy: Secondary | ICD-10-CM | POA: Diagnosis not present

## 2024-07-02 DIAGNOSIS — Z951 Presence of aortocoronary bypass graft: Secondary | ICD-10-CM | POA: Diagnosis not present

## 2024-07-02 DIAGNOSIS — Z961 Presence of intraocular lens: Secondary | ICD-10-CM | POA: Diagnosis not present

## 2024-07-02 DIAGNOSIS — Z9841 Cataract extraction status, right eye: Secondary | ICD-10-CM | POA: Diagnosis not present

## 2024-07-02 DIAGNOSIS — Z9842 Cataract extraction status, left eye: Secondary | ICD-10-CM | POA: Diagnosis not present

## 2024-07-02 DIAGNOSIS — I129 Hypertensive chronic kidney disease with stage 1 through stage 4 chronic kidney disease, or unspecified chronic kidney disease: Secondary | ICD-10-CM | POA: Diagnosis not present

## 2024-07-02 DIAGNOSIS — E1122 Type 2 diabetes mellitus with diabetic chronic kidney disease: Secondary | ICD-10-CM | POA: Diagnosis not present

## 2024-07-02 DIAGNOSIS — I499 Cardiac arrhythmia, unspecified: Secondary | ICD-10-CM | POA: Diagnosis not present

## 2024-07-02 DIAGNOSIS — Z7901 Long term (current) use of anticoagulants: Secondary | ICD-10-CM | POA: Diagnosis not present

## 2024-07-02 DIAGNOSIS — N184 Chronic kidney disease, stage 4 (severe): Secondary | ICD-10-CM | POA: Diagnosis not present

## 2024-07-02 DIAGNOSIS — Z7902 Long term (current) use of antithrombotics/antiplatelets: Secondary | ICD-10-CM | POA: Diagnosis not present

## 2024-07-03 DIAGNOSIS — I361 Nonrheumatic tricuspid (valve) insufficiency: Secondary | ICD-10-CM | POA: Diagnosis not present

## 2024-07-03 DIAGNOSIS — I340 Nonrheumatic mitral (valve) insufficiency: Secondary | ICD-10-CM | POA: Diagnosis not present

## 2024-07-03 DIAGNOSIS — I272 Pulmonary hypertension, unspecified: Secondary | ICD-10-CM | POA: Diagnosis not present

## 2024-07-03 DIAGNOSIS — I517 Cardiomegaly: Secondary | ICD-10-CM | POA: Diagnosis not present

## 2024-07-03 DIAGNOSIS — I878 Other specified disorders of veins: Secondary | ICD-10-CM | POA: Diagnosis not present

## 2024-07-03 DIAGNOSIS — I519 Heart disease, unspecified: Secondary | ICD-10-CM | POA: Diagnosis not present

## 2024-07-04 ENCOUNTER — Encounter: Payer: Self-pay | Admitting: Cardiology

## 2024-07-08 DIAGNOSIS — K8050 Calculus of bile duct without cholangitis or cholecystitis without obstruction: Secondary | ICD-10-CM | POA: Diagnosis not present

## 2024-07-08 MED ORDER — ASPIRIN 81 MG PO TBEC: 81.0000 mg | DELAYED_RELEASE_TABLET | Freq: Every day | ORAL | Status: AC

## 2024-07-08 NOTE — Telephone Encounter (Signed)
 Medication list updated as reported by patient

## 2024-07-09 ENCOUNTER — Telehealth: Payer: Self-pay

## 2024-07-09 NOTE — Telephone Encounter (Signed)
  Patient Consent for Virtual Visit        LEONTE HORRIGAN Sr. has provided verbal consent on 07/09/2024 for a virtual visit (video or telephone).   CONSENT FOR VIRTUAL VISIT FOR:  Debby JINNY Bold Sr.  By participating in this virtual visit I agree to the following:  I hereby voluntarily request, consent and authorize [facility] and its employed or contracted physicians, physician assistants, nurse practitioners or other licensed health care professionals (the Practitioner), to provide me with telemedicine health care services (the "Services) as deemed necessary by the treating Practitioner. I acknowledge and consent to receive the Services by the Practitioner via telemedicine. I understand that the telemedicine visit will involve communicating with the Practitioner through live audiovisual communication technology and the disclosure of certain medical information by electronic transmission. I acknowledge that I have been given the opportunity to request an in-person assessment or other available alternative prior to the telemedicine visit and am voluntarily participating in the telemedicine visit.  I understand that I have the right to withhold or withdraw my consent to the use of telemedicine in the course of my care at any time, without affecting my right to future care or treatment, and that the Practitioner or I may terminate the telemedicine visit at any time. I understand that I have the right to inspect all information obtained and/or recorded in the course of the telemedicine visit and may receive copies of available information for a reasonable fee.  I understand that some of the potential risks of receiving the Services via telemedicine include:  Delay or interruption in medical evaluation due to technological equipment failure or disruption; Information transmitted may not be sufficient (e.g. poor resolution of images) to allow for appropriate medical decision making by the  Practitioner; and/or  In rare instances, security protocols could fail, causing a breach of personal health information.  Furthermore, I acknowledge that it is my responsibility to provide information about my medical history, conditions and care that is complete and accurate to the best of my ability. I acknowledge that Practitioner's advice, recommendations, and/or decision may be based on factors not within their control, such as incomplete or inaccurate data provided by me or distortions of diagnostic images or specimens that may result from electronic transmissions. I understand that the practice of medicine is not an exact science and that Practitioner makes no warranties or guarantees regarding treatment outcomes. I acknowledge that a copy of this consent can be made available to me via my patient portal [facility] MyChart), or I can request a printed copy by calling the office of [facility].    I understand that my insurance will be billed for this visit.   I have read or had this consent read to me. I understand the contents of this consent, which adequately explains the benefits and risks of the Services being provided via telemedicine.  I have been provided ample opportunity to ask questions regarding this consent and the Services and have had my questions answered to my satisfaction. I give my informed consent for the services to be provided through the use of telemedicine in my medical care

## 2024-07-09 NOTE — Telephone Encounter (Signed)
   Name: Garrett Jordan  DOB: February 13, 1941  MRN: [id number]  Primary Cardiologist: Oneil Parchment, MD   Preoperative team, please contact this patient and set up a phone call appointment for further preoperative risk assessment. Please obtain consent and complete medication review. Thank you for your help.  I confirm that guidance regarding antiplatelet and oral anticoagulation therapy has been completed and, if necessary, noted below.  Per office protocol and pending no symptoms of ACS at time of virtual visit, he may hold Plavix  for 5 days prior to procedure and should resume as soon as hemodynamically stable postoperatively.   I also confirmed the patient resides in the state of [place] . As per [facility] Medical Board telemedicine laws, the patient must reside in the state in which the provider is licensed.   Wyn Raddle, Jackee Shove, NP 07/09/2024, 11:46 AM [facility]

## 2024-07-09 NOTE — Telephone Encounter (Signed)
   Pre-operative Risk Assessment    Patient Name: Garrett MAHONE Sr.  DOB: [birth year] MRN: [id number]   Date of last office visit: 02/13/2024, Garrett Hummer, PA-C Date of next office visit: NONE   Request for Surgical Clearance    Procedure:  Gallbladder surgery   Date of Surgery:  Clearance TBD                                Surgeon: Dr. Richerd Silversmith, MD Surgeon's Group or Practice Name: [facility] Phone number: [phone] Fax number: [phone]   Type of Clearance Requested:   - Medical  - Pharmacy:  Hold Aspirin  and Clopidogrel  (Plavix )     Type of Anesthesia:  General    Additional requests/questions:  Please confirm if the patient needs to wait 6 months from recent Amulet Implant if applicable.   Garrett Jordan   07/09/2024, 8:34 AM

## 2024-07-09 NOTE — Telephone Encounter (Signed)
 Preop tele appt now scheduled, med rec and consent done.

## 2024-07-11 DIAGNOSIS — I480 Paroxysmal atrial fibrillation: Secondary | ICD-10-CM | POA: Diagnosis not present

## 2024-07-22 ENCOUNTER — Ambulatory Visit: Attending: Cardiology

## 2024-07-22 DIAGNOSIS — Z01810 Encounter for preprocedural cardiovascular examination: Secondary | ICD-10-CM

## 2024-07-22 NOTE — Telephone Encounter (Signed)
 I will update the requesting office per preop APP Josefa Beauvais, FNP to see notes. A new clearance request will need to be faxed to our office once procedure has been rescheduled.    Beauvais Josefa HERO, NP  Wynetta Niels HERO, CMA Patient had amulet placed 07/02/2024.  This is similar to a Watchman plug       Telephone Visit 07/22/2024 CVD HVT PRE OP CLEARANCE APP - [facility] [employer] [facility] [employer]. [facility]   Progress Notes  Beauvais Josefa HERO, NP at 07/22/2024  9:00 AM  Status: Signed  Reoperative team,   Mr. Thau reports that he had amulet placed on July 02, 2024.  This is similar to [employer].  However, he had shallow atrial appendage.  This was placed at [facility].  He has follow-up esophageal echocardiogram planned but has not yet been scheduled.  He will not be eligible for surgery for at least 6 months.  Please contact requesting office and let them know that his procedure will need to be rescheduled.  Thank you for your help.   Josefa HERO. Cleaver NP-C      07/22/2024, 9:05 AM [facility] [address] [place], [place] [address] Office [phone]

## 2024-07-22 NOTE — Progress Notes (Signed)
 Reoperative team,  Mr. Rembold reports that he had amulet placed on July 02, 2024.  This is similar to [employer].  However, he had shallow atrial appendage.  This was placed at [facility].  He has follow-up esophageal echocardiogram planned but has not yet been scheduled.  He will not be eligible for surgery for at least 6 months.  Please contact requesting office and let them know that his procedure will need to be rescheduled.  Thank you for your help.  Josefa HERO. Abhiraj Dozal NP-C     07/22/2024, 9:05 AM [facility] [address] [place], [place] [address] Office [phone]

## 2024-07-30 ENCOUNTER — Encounter: Payer: Self-pay | Admitting: Internal Medicine

## 2024-07-30 ENCOUNTER — Ambulatory Visit: Admitting: Internal Medicine

## 2024-07-30 ENCOUNTER — Other Ambulatory Visit: Payer: Self-pay | Admitting: Cardiology

## 2024-07-30 VITALS — BP 126/80 | HR 73 | Ht 69.5 in | Wt 187.0 lb

## 2024-07-30 DIAGNOSIS — E1122 Type 2 diabetes mellitus with diabetic chronic kidney disease: Secondary | ICD-10-CM | POA: Diagnosis not present

## 2024-07-30 DIAGNOSIS — N1832 Chronic kidney disease, stage 3b: Secondary | ICD-10-CM

## 2024-07-30 DIAGNOSIS — E1142 Type 2 diabetes mellitus with diabetic polyneuropathy: Secondary | ICD-10-CM | POA: Diagnosis not present

## 2024-07-30 DIAGNOSIS — E1159 Type 2 diabetes mellitus with other circulatory complications: Secondary | ICD-10-CM | POA: Diagnosis not present

## 2024-07-30 LAB — POCT GLYCOSYLATED HEMOGLOBIN (HGB A1C): Hemoglobin A1C: 6.4 % — AB (ref 4.0–5.6)

## 2024-07-30 LAB — POCT GLUCOSE (DEVICE FOR HOME USE): Glucose Fasting, POC: 141 mg/dL — AB (ref 70–99)

## 2024-07-30 MED ORDER — GLIPIZIDE 5 MG PO TABS: 5.0000 mg | ORAL_TABLET | Freq: Every day | ORAL | 3 refills | Status: AC

## 2024-07-30 MED ORDER — DAPAGLIFLOZIN PROPANEDIOL 10 MG PO TABS: 10.0000 mg | ORAL_TABLET | Freq: Every day | ORAL | 3 refills | Status: AC

## 2024-07-30 NOTE — Progress Notes (Signed)
 Name: Garrett Jordan  Age/ Sex: 83 y.o., male   MRN/ DOB: [id number], April 14, 1941     PCP: Seabron Lenis, MD   Reason for Endocrinology Evaluation: Type 2 Diabetes Mellitus     Initial Endocrinology Clinic Visit:  03/17/2020    PATIENT IDENTIFIER: Garrett Jordan. is a 83 y.o. male with a past medical history of T2DM, RLS, CAD and CKD. The patient has followed with Endocrinology clinic since 03/17/2020 for consultative assistance with management of his diabetes.      DIABETIC HISTORY:  Garrett Jordan was diagnosed with T2 DM in 2019. Invokana- cost prohibitive . His hemoglobin A1c has ranged from 6.7% in 2020, peaking at 7.8 % in 2021    On his initial visit to our clinic his A1c was 6.5 % , he was on Januvia which was continued, Invokana was cost prohibitive and we switched it to Glipizide .   Januvia stopped 06/2020 due to high cost for pt  Jardiance started by nephrology by 9/2021but this was temporarily held by cardiology due to elevated Cr but restarted 09/2022    HYPERCALCEMIA HISTORY: Garrett Jordan indicates that he was first noted with hypercalcemia in 2020. He has hx of kidney stones  He is S/P Left parathyroidectomy in 1970's  Ca/Cr ratio 0.0210  SUBJECTIVE:    During the last visit (03/25/2024): A1c 6.0%   Today (07/30/2024): Garrett Jordan is here for a follow up on diabetes management.  He checks his blood sugars 2 times daily . The patient has not had hypoglycemic episodes since the last clinic visit.   He continues to follow-up with cardiology for paroxysmal A-fib, CAD, s/p CABG and HTN.  He is s/p left atrial appendage occlusion 12/2023  which wasn't successful , had a repeat  in 06/2024  Patient established with [facility] colonic surgery for biliary colic, has to wait for 6 months from  No abdominal pain at this time  No nausea  No constipation or diarrhea    HOME DIABETES REGIMEN:  Glipizide  5 mg, 1 tablet before Supper  Farxiga  10 mg daily      Statin: yes ACE-I/ARB: yes     METER DOWNLOAD SUMMARY:Unable to download    DIABETIC COMPLICATIONS: Microvascular complications:  CKD III Denies: retinopathy, neuropathy  Last Eye Exam: Completed 01/2024  Macrovascular complications:  CAD (s/p CABG 2010), NSTEMI 06/2022 Denies: CVA, PVD       PHYSICAL EXAM: VS: BP 126/80 (BP Location: Left Arm, Patient Position: Sitting, Cuff Size: Normal)   Pulse 73   Ht 5' 9.5 (1.765 m)   Wt 187 lb (84.8 kg)   SpO2 97%   BMI 27.22 kg/m    EXAM: General: Pt appears well and is in NAD  Lungs: Clear with good BS bilat   Heart: Auscultation: RRR  Extremities:  BL LE:no pretibial edema   Mental Status: Judgment, insight: intact Orientation: oriented to time, place, and person Mood and affect: no depression, anxiety, or agitation    DM Foot Exam 07/30/2024  The skin of the feet is intact without sores or ulcerations. The pedal pulses are 1+ on right and 1+ on left. The sensation is intact to a screening 5.07, 10 gram monofilament bilaterally    HISTORY:  Past Medical History:  Past Medical History:  Diagnosis Date   AAA (abdominal aortic aneurysm) (HCC)    Arthritis    knees   Atrial fibrillation (HCC)    CAD (coronary artery disease)  Cancer Garrett Jordan)    prostate   CKD (chronic kidney disease), stage III (HCC) 06/18/2022   COVID    06/2019 and in 2022 states they were mild cases   Diabetes mellitus without complication (HCC)    Dyspnea    once in a while   Dysrhythmia    A-fib pt is on Eliquis    GERD (gastroesophageal reflux disease)    Gout    History of kidney stones    Hyperlipidemia    Hypertension    Hypothyroidism    Myocardial infarction (HCC) 01/1991   sp inferior   Nerve compression    right leg   Reflux    Seasonal allergies    Thoracic ascending aortic aneurysm (HCC)    Thyroid  disease    hypothyroidism   Past Surgical History:  Past Surgical History:  Procedure Laterality Date    ABDOMINAL AORTAGRAM N/A 02/28/2012   Procedure: ABDOMINAL EZELLA;  Surgeon: Gaile LELON New, MD;  Location: [facility] CATH LAB;  Service: Cardiovascular;  Laterality: N/A;   abdominal aortagram embolization  02/28/2012   ABDOMINAL AORTIC ANEURYSM REPAIR  07/14/2012   BIOPSY  06/28/2023   Procedure: BIOPSY;  Surgeon: Dianna Specking, MD;  Location: [facility] ENDOSCOPY;  Service: Gastroenterology;;   CARDIAC CATHETERIZATION     CATARACT EXTRACTION Bilateral 2016   COLONOSCOPY WITH PROPOFOL  N/A 06/28/2023   Procedure: COLONOSCOPY WITH PROPOFOL ;  Surgeon: Dianna Specking, MD;  Location: [facility] ENDOSCOPY;  Service: Gastroenterology;  Laterality: N/A;   CORONARY ARTERY BYPASS GRAFT  04/13/2009   CORONARY BALLOON ANGIOPLASTY N/A 06/15/2022   Procedure: CORONARY BALLOON ANGIOPLASTY;  Surgeon: Wonda Sharper, MD;  Location: [facility] INVAS[facility] CV LAB;  Service: Cardiovascular;  Laterality: N/A;   CORONARY STENT PLACEMENT  1992 and  2009   RCA   CYSTOSCOPY W/ URETERAL STENT PLACEMENT Right 04/24/2023   Procedure: CYSTOSCOPY WITH RETROGRADE PYELOGRAM/URETERAL STENT PLACEMENT;  Surgeon: Cam Morene LELON, MD;  Location: [facility] OR;  Service: Urology;  Laterality: Right;   CYSTOSCOPY/URETEROSCOPY/HOLMIUM LASER/STENT PLACEMENT Right 05/03/2023   Procedure: CYSTOSCOPY/URETEROSCOPY/HOLMIUM LASER/STONE EXTRACTION/STENT REMOVAL, FULGURATION;  Surgeon: Matilda Senior, MD;  Location: [facility] ORS;  Service: Urology;  Laterality: Right;  1 HR   EMBOLIZATION Right 02/28/2012   Procedure: EMBOLIZATION;  Surgeon: Gaile LELON New, MD;  Location: [facility] CATH LAB;  Service: Cardiovascular;  Laterality: Right;   IR GENERIC HISTORICAL  12/05/2016   IR RADIOLOGIST EVAL & MGMT 12/05/2016 Garrett Lent, MD [facility] INTERV RAD   IR RADIOLOGIST EVAL & MGMT  07/10/2019   IR RADIOLOGIST EVAL & MGMT  08/25/2020   IR RADIOLOGIST EVAL & MGMT  08/16/2021   IR RADIOLOGIST EVAL & MGMT  08/31/2022   KIDNEY STONE SURGERY     LAMINECTOMY WITH POSTERIOR LATERAL  ARTHRODESIS LEVEL 1 Bilateral 11/10/2022   Procedure: Lumbar Two-Lumbar Three laminectomy  - bilateral with posterior lateral fusion and interspinous plating;  Surgeon: Joshua Alm RAMAN, MD;  Location: [facility] OR;  Service: Neurosurgery;  Laterality: Bilateral;   LEFT ATRIAL APPENDAGE OCCLUSION N/A 12/20/2023   Procedure: LEFT ATRIAL APPENDAGE OCCLUSION;  Surgeon: Cindie Ole DASEN, MD;  Location: [facility] INVASIVE CV LAB;  Service: Cardiovascular;  Laterality: N/A;   LEFT HEART CATH AND CORONARY ANGIOGRAPHY N/A 06/15/2022   Procedure: LEFT HEART CATH AND CORONARY ANGIOGRAPHY;  Surgeon: Wonda Sharper, MD;  Location: [facility] INVASIVE CV LAB;  Service: Cardiovascular;  Laterality: N/A;   LEFT HEART CATHETERIZATION WITH CORONARY/GRAFT ANGIOGRAM N/A 01/08/2015   Procedure: LEFT HEART CATHETERIZATION WITH EL BILE;  Surgeon: Elsie RAMAN Somerset, MD;  Location: [facility] CATH LAB;  Service: Cardiovascular;  Laterality: N/A;   parathyroid  adenoma  1981   surgery   POLYPECTOMY  06/28/2023   Procedure: POLYPECTOMY;  Surgeon: Dianna Specking, MD;  Location: [facility] ENDOSCOPY;  Service: Gastroenterology;;   PROSTATE SURGERY     rad seeds   SPINE SURGERY  2006   TONSILLECTOMY  1955   TRANSESOPHAGEAL ECHOCARDIOGRAM (CATH LAB) N/A 12/20/2023   Procedure: TRANSESOPHAGEAL ECHOCARDIOGRAM;  Surgeon: Cindie Ole DASEN, MD;  Location: [facility] INVASIVE CV LAB;  Service: Cardiovascular;  Laterality: N/A;   Social History:  reports that he quit smoking about 54 years ago. His smoking use included cigarettes. He has never used smokeless tobacco. He reports current alcohol use of about 6.0 standard drinks of alcohol per week. He reports that he does not use drugs. Family History:  Family History  Problem Relation Age of Onset   Heart disease Father        Heart Disease before age 55   Heart attack Father    Hyperlipidemia Father    Hypertension Father    Stroke Mother    Deep vein thrombosis Mother    Heart disease Sister     Diabetes Sister    Hyperlipidemia Sister    Hypertension Sister    Heart attack Sister    Heart disease Paternal Uncle    Diabetes Maternal Grandmother    Diabetes Sister      HOME MEDICATIONS: Allergies as of 07/30/2024       Reactions   Quinolones Other (See Comments)   Aortic root enlargement   Ace Inhibitors Cough   Crestor  [rosuvastatin ] Other (See Comments)   Muscle aches   Nitroglycerin  Other (See Comments)   Very pronounced lowered B/P with IV nitro for heart caths        Medication List        Accurate as of July 30, 2024  7:54 AM. If you have any questions, ask your nurse or doctor.          Accu-Chek Guide Test test strip Generic drug: glucose blood USE TO TEST TWICE DAILY   acetaminophen  650 MG CR tablet Commonly known as: TYLENOL  Take 1,950 mg by mouth 2 (two) times daily as needed for pain.   allopurinol  300 MG tablet Commonly known as: ZYLOPRIM  Take 300 mg by mouth every other day.   amLODipine  10 MG tablet Commonly known as: NORVASC  TAKE 1 TABLET BY MOUTH DAILY   Aspercreme Lidocaine  4 % Generic drug: lidocaine  Place 1 patch onto the skin daily as needed (pain).   aspirin  EC 81 MG tablet Take 1 tablet (81 mg total) by mouth daily. Swallow whole.   atorvastatin  40 MG tablet Commonly known as: LIPITOR TAKE 1 TABLET BY MOUTH EVERY DAY   carvedilol  6.25 MG tablet Commonly known as: COREG  TAKE 1 TABLET BY MOUTH TWICE A DAY   CHOLESTOFF PLUS PO Take 900 mg by mouth daily.   clonazePAM  1 MG tablet Commonly known as: KLONOPIN  Take 0.5-1 mg by mouth at bedtime.   clopidogrel  75 MG tablet Commonly known as: PLAVIX  TAKE 1 TABLET BY MOUTH DAILY   Coenzyme Q10 300 MG Caps Take 300 mg by mouth in the morning.   dapagliflozin  propanediol 10 MG Tabs tablet Commonly known as: Farxiga  Take 1 tablet (10 mg total) by mouth daily before breakfast. What changed: how much to take   ezetimibe  10 MG tablet Commonly known as: ZETIA  TAKE  1 TABLET BY MOUTH DAILY   fenofibrate  micronized  200 MG capsule Commonly known as: LOFIBRA Take 1 capsule (200 mg total) by mouth daily in the afternoon.   fluorouracil 5 % cream Commonly known as: EFUDEX Apply 1 Application topically daily as needed (Apply to affected area of the face 2 times a day for skin spots).   fluticasone  50 MCG/ACT nasal spray Commonly known as: FLONASE  Place 2 sprays into both nostrils daily as needed for allergies.   glipiZIDE  5 MG tablet Commonly known as: GLUCOTROL  Take 1 tablet (5 mg total) by mouth daily before supper.   isosorbide  mononitrate 30 MG 24 hr tablet Commonly known as: IMDUR  Take 1 tablet (30 mg total) by mouth daily.   levothyroxine  175 MCG tablet Commonly known as: SYNTHROID  Take 175 mcg by mouth daily before breakfast.   loratadine  10 MG tablet Commonly known as: CLARITIN  Take 10 mg by mouth at bedtime.   magnesium  oxide 400 (240 Mg) MG tablet Commonly known as: MAG-OX Take 400 mg by mouth every evening.   nitroGLYCERIN  0.4 MG SL tablet Commonly known as: Nitrostat  Place 1 tablet (0.4 mg total) under the tongue every 5 (five) minutes as needed for chest pain.   olmesartan  40 MG tablet Commonly known as: BENICAR  TAKE 1 TABLET BY MOUTH EVERY DAY   pantoprazole  40 MG tablet Commonly known as: PROTONIX  TAKE 1 TABLET BY MOUTH DAILY   PreserVision AREDS 2 Caps Take 1 capsule by mouth 2 (two) times daily.   SYSTANE OP Place 1 drop into both eyes daily as needed (dry/irritated eyes).   tamsulosin  0.4 MG Caps capsule Commonly known as: FLOMAX  Take 1 capsule (0.4 mg total) by mouth daily. What changed: when to take this   triamcinolone cream 0.1 % Commonly known as: KENALOG Apply 1 application  topically daily as needed (Psoriasis).   trolamine salicylate 10 % cream Commonly known as: ASPERCREME Apply 1 Application topically as needed for muscle pain.   TURMERIC PO Take 1 tablet by mouth daily. 500 mg /50 mg with  ginger   Vascepa  1 g capsule Generic drug: icosapent  Ethyl TAKE 2 CAPSULES BY MOUTH TWICE A DAY   Vitamin B-12 5000 MCG Tbdp Take 5,000 mcg by mouth daily.   Vitamin C 500 MG Caps Take 500 mg by mouth every evening.   Vitamin D3 50 MCG (2000 UT) Tabs Take 2,000 Units by mouth daily.        DM foot exam: 09/25/2023   The skin of the feet is intact without sores or ulcerations. The pedal pulses are 2+ on right and 2+ on left. The sensation is decreased to a screening 5.07, 10 gram monofilament at the left heel     DATA REVIEWED:  Lab Results  Component Value Date   HGBA1C 6.4 (A) 07/30/2024   HGBA1C 6.0 (A) 03/25/2024   HGBA1C 6.8 (A) 09/25/2023    Labs through Care Everywhere 07/02/2024 A1c 6.4% GFR 58 BUN 20 Creatinine 1.24   Latest Reference Range & Units 01/28/24 12:56  Sodium 135 - 145 mmol/L 135  Potassium 3.5 - 5.1 mmol/L 4.0  Chloride 98 - 111 mmol/L 105  CO2 22 - 32 mmol/L 20 (L)  Glucose 70 - 99 mg/dL 888 (H)  BUN 8 - 23 mg/dL 24 (H)  Creatinine 9.38 - 1.24 mg/dL 8.08 (H)  Calcium  8.9 - 10.3 mg/dL 9.3  Anion gap 5 - 15  10  Alkaline Phosphatase 38 - 126 U/L 73  Albumin 3.5 - 5.0 g/dL 3.5  Lipase 11 - 51 U/L 46  AST 15 - 41 U/L 47 (H)  ALT 0 - 44 U/L 20  Total Protein 6.5 - 8.1 g/dL 6.0 (L)  Total Bilirubin 0.0 - 1.2 mg/dL 2.0 (H)  GFR, Estimated >60 mL/min 35 (L)   In office BG 141 MGs/DL  ASSESSMENT / PLAN / RECOMMENDATIONS:   1) Type 2 Diabetes Mellitus, Optimally controlled, With CKD III , neuropathic and macrovascular complications - Most recent A1c of 6.4 %. Goal A1c < 7.0%.   - A1c remains optimal - He did not qualify for patient assistance program for Farxiga   - Patient advised to hold glipizide  if he has nausea or if his appetite is decreased below normal to prevent hypoglycemia   MEDICATIONS:  Continue glipizide  5 mg, 1  tablet  before supper  Continue Farxiga  10 mg daily    EDUCATION / INSTRUCTIONS: BG monitoring  instructions: Patient is instructed to check his blood sugars 2 times a day, before breakfast and supper . Call [facility] if: BG persistently < 70  I reviewed the Rule of 15 for the treatment of hypoglycemia in detail with the patient. Literature supplied.     2) Diabetic complications:  Eye: Does not have known diabetic retinopathy.  Neuro/ Feet: Does have known diabetic peripheral neuropathy .  Renal: Patient does  have known baseline CKD. He   is on an ACEI/ARB at present.     3) Dyslipidemia/CAD:  -Per cardiology - LDL at goal    F/U in 6 months    Signed electronically by: Stefano Redgie Butts, MD  [facility]  [facility] [address] [place] [place], [place] [address] Phone: [phone] FAX: [phone]   CC: Seabron Lenis, MD [address] [place] [address] Phone: [phone]  Fax: [phone]  Return to Endocrinology clinic as below: No future appointments.

## 2024-07-30 NOTE — Patient Instructions (Signed)
-   Continue  Glipizide  5 mg , 1 tablet Supper  - Continue  Farxiga  10 mg daily       HOW TO TREAT LOW BLOOD SUGARS (Blood sugar LESS THAN 70 MG/DL) Please follow the RULE OF 15 for the treatment of hypoglycemia treatment (when your (blood sugars are less than 70 mg/dL)   STEP 1: Take 15 grams of carbohydrates when your blood sugar is low, which includes:  3-4 GLUCOSE TABS  OR 3-4 OZ OF JUICE OR REGULAR SODA OR ONE TUBE OF GLUCOSE GEL    STEP 2: RECHECK blood sugar in 15 MINUTES STEP 3: If your blood sugar is still low at the 15 minute recheck --> then, go back to STEP 1 and treat AGAIN with another 15 grams of carbohydrates.

## 2024-07-31 ENCOUNTER — Ambulatory Visit: Payer: Self-pay | Admitting: Internal Medicine

## 2024-07-31 LAB — MICROALBUMIN / CREATININE URINE RATIO
Creatinine, Urine: 82 mg/dL (ref 20–320)
Microalb Creat Ratio: 28 mg/g{creat} (ref <30)
Microalb, Ur: 2.3 mg/dL

## 2024-08-04 ENCOUNTER — Other Ambulatory Visit: Payer: Self-pay | Admitting: Cardiology

## 2024-08-26 ENCOUNTER — Other Ambulatory Visit: Payer: Self-pay | Admitting: Surgery

## 2024-08-26 DIAGNOSIS — I7121 Aneurysm of the ascending aorta, without rupture: Secondary | ICD-10-CM

## 2024-08-30 ENCOUNTER — Other Ambulatory Visit: Payer: Self-pay | Admitting: Cardiology

## 2024-09-15 ENCOUNTER — Other Ambulatory Visit: Payer: Self-pay | Admitting: Internal Medicine

## 2024-09-25 ENCOUNTER — Ambulatory Visit (HOSPITAL_COMMUNITY)
Admission: RE | Admit: 2024-09-25 | Discharge: 2024-09-25 | Disposition: A | Discharge status: Home / Self Care | Source: Ambulatory Visit | Attending: Cardiology | Admitting: Cardiology

## 2024-09-25 ENCOUNTER — Ambulatory Visit: Payer: Self-pay | Admitting: Cardiology

## 2024-09-25 DIAGNOSIS — I7121 Aneurysm of the ascending aorta, without rupture: Secondary | ICD-10-CM | POA: Diagnosis present

## 2024-09-25 LAB — POCT I-STAT CREATININE: Creatinine, Ser: 1.7 mg/dL — ABNORMAL HIGH (ref 0.61–1.24)

## 2024-09-25 MED ORDER — IOHEXOL 350 MG/ML SOLN
75.0000 mL | Freq: Once | INTRAVENOUS | Status: AC | PRN
  Administered 2024-09-25: 75 mL via INTRAVENOUS

## 2024-10-02 ENCOUNTER — Ambulatory Visit

## 2024-10-02 VITALS — BP 142/63 | HR 73 | Resp 20 | Wt 190.0 lb

## 2024-10-02 DIAGNOSIS — I7121 Aneurysm of the ascending aorta, without rupture: Secondary | ICD-10-CM | POA: Diagnosis not present

## 2024-10-02 NOTE — Progress Notes (Signed)
 [address] [place] [address]             [phone]            Garrett Jordan [id number] May 23, 1941   History of Present Illness:  Garrett Jordan is an 83 year old male with medical history of coronary artery disease, hypertension, abdominal aortic aneurysm without rupture, s/p EVAR with Dr. Serene, paroxysmal atrial fibrillation, gallstones, hypothyroidism, type 2 diabetes, stage III chronic kidney disease, BPH, s/p CABG x 5 in 2010 with Dr. Elois, gout and hyperlipidemia who returns for continued surveillance of ascending thoracic aortic aneurysm and penetrating ulcers along aortic arch. Aneurysm measured 4.3 cm and ulcerations along aortic arch have not had significant change.   He presents to the clinic today and reports that he is doing well. His blood pressure is slight elevated at today's visit.  He does occasionally check his blood pressure at home and reports readings 130/80.  He denies chest pain, shortness of breath and lower leg swelling.  He does report that his son had an aortic dissection repair at the age of 77 by Dr. Dusty.     Current Outpatient Medications on File Prior to Visit  Medication Sig Dispense Refill   acetaminophen  (TYLENOL ) 650 MG CR tablet Take 1,950 mg by mouth 2 (two) times daily as needed for pain.     allopurinol  (ZYLOPRIM ) 300 MG tablet Take 300 mg by mouth every other day.     amLODipine  (NORVASC ) 10 MG tablet TAKE 1 TABLET BY MOUTH DAILY 100 tablet 2   Ascorbic Acid (VITAMIN C) 500 MG CAPS Take 500 mg by mouth every evening.     aspirin  EC 81 MG tablet Take 1 tablet (81 mg total) by mouth daily. Swallow whole.     atorvastatin  (LIPITOR) 40 MG tablet TAKE 1 TABLET BY MOUTH EVERY DAY 90 tablet 3   carvedilol  (COREG ) 6.25 MG tablet TAKE 1 TABLET BY MOUTH TWICE A DAY 180 tablet 3   Cholecalciferol (VITAMIN D3) 2000 UNITS TABS Take 2,000 Units by mouth daily.      clonazePAM  (KLONOPIN ) 1 MG tablet Take 0.5-1  mg by mouth at bedtime.     clopidogrel  (PLAVIX ) 75 MG tablet TAKE 1 TABLET BY MOUTH DAILY 100 tablet 2   Coenzyme Q10 300 MG CAPS Take 300 mg by mouth in the morning.     Cyanocobalamin  (VITAMIN B-12) 5000 MCG TBDP Take 5,000 mcg by mouth daily.     dapagliflozin  propanediol (FARXIGA ) 10 MG TABS tablet Take 1 tablet (10 mg total) by mouth daily before breakfast. 90 tablet 3   ezetimibe  (ZETIA ) 10 MG tablet TAKE 1 TABLET BY MOUTH DAILY 100 tablet 3   fenofibrate  micronized (LOFIBRA) 200 MG capsule TAKE 1 CAPSULE BY MOUTH DAILY IN THE AFTERNOON 100 capsule 2   fluorouracil (EFUDEX) 5 % cream Apply 1 Application topically daily as needed (Apply to affected area of the face 2 times a day for skin spots).     fluticasone  (FLONASE ) 50 MCG/ACT nasal spray Place 2 sprays into both nostrils daily as needed for allergies.     glipiZIDE  (GLUCOTROL ) 5 MG tablet Take 1 tablet (5 mg total) by mouth daily before supper. 90 tablet 3   glucose blood (ACCU-CHEK GUIDE TEST) test strip USE TO TEST TWICE DAILY 200 strip 5   isosorbide  mononitrate (IMDUR ) 30 MG 24 hr tablet TAKE 1 TABLET BY MOUTH EVERY DAY 90  tablet 2   levothyroxine  (SYNTHROID , LEVOTHROID) 175 MCG tablet Take 175 mcg by mouth daily before breakfast.     lidocaine  (ASPERCREME LIDOCAINE ) 4 % Place 1 patch onto the skin daily as needed (pain).     loratadine  (CLARITIN ) 10 MG tablet Take 10 mg by mouth at bedtime.     magnesium  oxide (MAG-OX) 400 (240 Mg) MG tablet Take 400 mg by mouth every evening.     Multiple Vitamins-Minerals (PRESERVISION AREDS 2) CAPS Take 1 capsule by mouth 2 (two) times daily.     nitroGLYCERIN  (NITROSTAT ) 0.4 MG SL tablet Place 1 tablet (0.4 mg total) under the tongue every 5 (five) minutes as needed for chest pain. 25 tablet 3   olmesartan  (BENICAR ) 40 MG tablet TAKE 1 TABLET BY MOUTH EVERY DAY 90 tablet 3   pantoprazole  (PROTONIX ) 40 MG tablet TAKE 1 TABLET BY MOUTH DAILY 100 tablet 2   Plant Sterols and Stanols (CHOLESTOFF  PLUS PO) Take 900 mg by mouth daily.     Polyethyl Glycol-Propyl Glycol (SYSTANE OP) Place 1 drop into both eyes daily as needed (dry/irritated eyes).     tamsulosin  (FLOMAX ) 0.4 MG CAPS capsule Take 1 capsule (0.4 mg total) by mouth daily. (Patient taking differently: Take 0.4 mg by mouth every other day.) 30 capsule 2   triamcinolone cream (KENALOG) 0.1 % Apply 1 application  topically daily as needed (Psoriasis).  2   trolamine salicylate (ASPERCREME) 10 % cream Apply 1 Application topically as needed for muscle pain.     TURMERIC PO Take 1 tablet by mouth daily. 500 mg /50 mg with ginger     VASCEPA  1 g capsule TAKE 2 CAPSULES BY MOUTH TWICE A DAY 360 capsule 3   No current facility-administered medications on file prior to visit.     ROS: Review of Systems  Respiratory:  Negative for cough and shortness of breath.   Cardiovascular:  Negative for chest pain and leg swelling.     BP (!) 142/63 (BP Location: Left Arm, Patient Position: Sitting, Cuff Size: Normal)   Pulse 73   Resp 20   Wt 190 lb (86.2 kg)   SpO2 96% Comment: RA  BMI 27.66 kg/m   Physical Exam Constitutional:      Appearance: Normal appearance.  HENT:     Head: Normocephalic and atraumatic.  Skin:    General: Skin is warm and dry.  Neurological:     General: No focal deficit present.     Mental Status: He is alert and oriented to person, place, and time.      Imaging: CLINICAL DATA:  Evaluate thoracic aortic aneurysm.   EXAM: CT ANGIOGRAPHY CHEST WITH CONTRAST   TECHNIQUE: Multidetector CT imaging of the chest was performed using the standard protocol during bolus administration of intravenous contrast. Multiplanar CT image reconstructions and MIPs were obtained to evaluate the vascular anatomy.   RADIATION DOSE REDUCTION: This exam was performed according to the departmental dose-optimization program which includes automated exposure control, adjustment of the mA and/or kV according to patient  size and/or use of iterative reconstruction technique.   CONTRAST:  75mL OMNIPAQUE  IOHEXOL  350 MG/ML SOLN   COMPARISON:  09/19/2023, 08/22/2022   FINDINGS: Cardiovascular: Prior median sternotomy. Heart is normal size. Calcification over the left main and 3 vessel coronary arteries. Prior CABG. Right coronary circulation vein graft unchanged. Vein graft extending to the left circumflex coronary circulation is thrombosed and unchanged. Interval placement left atrial appendage occlusive device. Pulmonary arterial system is  unremarkable.   Aneurysm of the ascending thoracic aorta measuring 4.3 cm unchanged. Aortic root at the sinus of Valsalva measures 4 cm and sinotubular junction measures 3.1 cm. Aortic arch measures 3.6 cm. Normal 3 vessel takeoff of the aortic arch. Descending thoracic aorta measures 2.7 cm.   1.1 cm pseudoaneurysm versus penetrating ulcer along the lateral aspect of the posterior arch unchanged. Additional previously seen possible small penetrating ulcer over the distal posterior arch not as well slightly smaller and difficult to measure   Mediastinum/Nodes: No mediastinal or hilar adenopathy. Remaining mediastinal structures are unremarkable.   Lungs/Pleura: Lungs are adequately inflated. No acute airspace process or effusion. 4 mm stable nodule over the right lower lobe adjacent the diaphragm. This has been stable for 2 years and is therefore considered benign. Airways are unremarkable.   Upper Abdomen: Evidence of cholelithiasis. Outside plaque over the abdominal aorta. Stable cyst over the upper pole left kidney. No acute findings.   Musculoskeletal: No focal abnormality.   Review of the MIP images confirms the above findings.   IMPRESSION: 1. Stable 4.3 cm ascending thoracic aortic aneurysm. Recommend annual imaging followup by CTA or MRA. This recommendation follows 2010 ACCF/AHA/AATS/ACR/ASA/SCA/SCAI/SIR/STS/SVM Guidelines for the Diagnosis and  Management of Patients with Thoracic Aortic Disease. Circulation. 2010; 121: [id number]. Aortic aneurysm NOS (ICD10-I71.9). 2. Stable 1.1 cm pseudoaneurysm versus penetrating ulcer along the lateral aspect of the posterior arch. Additional previously seen small penetrating ulcer over the distal posterior arch not as apparent and difficult to measure. 3. Evidence of previous CABG as described. 4. Cholelithiasis. 5. Stable left renal cyst. 6. Aortic atherosclerosis.   Aortic Atherosclerosis (ICD10-I70.0).     Electronically Signed   By: Toribio Agreste M.D.   On: 09/25/2024 12:03       A/P: Aneurysm of ascending aorta without rupture -4.3 cm ascending thoracic aortic aneurysm on CTA of chest. Penetrating ulcers along aortic arch have not significantly changed.  -We discussed the natural history and and risk factors for growth of ascending aortic aneurysms. Discussed recommendations to minimize the risk of further expansion or dissection including careful blood pressure control, avoidance of contact sports and heavy lifting, attention to lipid management.  We covered the importance of continued smoking cessation.  The patient does not yet meet surgical criteria of >5.5cm. The patient is aware of signs and symptoms of aortic dissection and when to present to the emergency department   -Follow up in one year with CTA of chest for continued surveillance  Risk Modification:  Statin:  atorvastatin   Smoking cessation instruction/counseling given:  commended patient for quitting and reviewed strategies for preventing relapses  Patient was counseled on importance of Blood Pressure Control  They are instructed to contact their Primary Care Physician if they start to have blood pressure readings over 130s/90s. Do not ever stop blood pressure medications on your own, unless instructed by healthcare professional.  Please avoid use of Fluoroquinolones as this can potentially increase your risk of  Aortic Rupture and/or Dissection  Patient educated on signs and symptoms of Aortic Dissection, handout also provided in AVS  Manuelita CHRISTELLA Rough, PA-C 10/02/24

## 2024-10-02 NOTE — Patient Instructions (Signed)

## 2024-11-11 ENCOUNTER — Other Ambulatory Visit: Payer: Self-pay | Admitting: Nephrology

## 2024-11-11 DIAGNOSIS — N1832 Chronic kidney disease, stage 3b: Secondary | ICD-10-CM

## 2024-11-25 ENCOUNTER — Ambulatory Visit
Admission: RE | Admit: 2024-11-25 | Discharge: 2024-11-25 | Disposition: A | Source: Ambulatory Visit | Attending: Nephrology

## 2024-11-25 DIAGNOSIS — N1832 Chronic kidney disease, stage 3b: Secondary | ICD-10-CM

## 2024-12-14 ENCOUNTER — Encounter: Payer: Self-pay | Admitting: Cardiology

## 2024-12-16 ENCOUNTER — Telehealth: Payer: Self-pay | Admitting: Pharmacy Technician

## 2024-12-16 NOTE — Telephone Encounter (Signed)
 Patient Advocate Encounter   The patient was approved for a [employer] grant that will help cover the cost of Vascepa  Total amount awarded, 2500.00.  Effective: 12/22/24 - 12/21/25   APW:[id number] ERW:[id number] Hmnle:[id number] PI:[id number]   [employer] ID: [id number]   Pharmacy provided with approval and processing information. Patient informed via mychart

## 2025-01-27 ENCOUNTER — Ambulatory Visit: Admitting: Internal Medicine
